# Patient Record
Sex: Female | Born: 1955
Health system: Southern US, Community
[De-identification: ages and names within clinical notes are randomized; demographics above are authoritative.]

## PROBLEM LIST (undated history)

## (undated) DIAGNOSIS — F32A Depression, unspecified: Secondary | ICD-10-CM

## (undated) DIAGNOSIS — M810 Age-related osteoporosis without current pathological fracture: Secondary | ICD-10-CM

## (undated) DIAGNOSIS — Z780 Asymptomatic menopausal state: Secondary | ICD-10-CM

## (undated) DIAGNOSIS — I10 Essential (primary) hypertension: Secondary | ICD-10-CM

## (undated) DIAGNOSIS — N289 Disorder of kidney and ureter, unspecified: Secondary | ICD-10-CM

## (undated) DIAGNOSIS — T7840XA Allergy, unspecified, initial encounter: Secondary | ICD-10-CM

## (undated) DIAGNOSIS — B0229 Other postherpetic nervous system involvement: Secondary | ICD-10-CM

## (undated) DIAGNOSIS — C4490 Unspecified malignant neoplasm of skin, unspecified: Secondary | ICD-10-CM

## (undated) DIAGNOSIS — J189 Pneumonia, unspecified organism: Secondary | ICD-10-CM

## (undated) DIAGNOSIS — E785 Hyperlipidemia, unspecified: Secondary | ICD-10-CM

## (undated) DIAGNOSIS — R51 Headache: Secondary | ICD-10-CM

## (undated) DIAGNOSIS — F419 Anxiety disorder, unspecified: Secondary | ICD-10-CM

## (undated) DIAGNOSIS — J45909 Unspecified asthma, uncomplicated: Secondary | ICD-10-CM

## (undated) DIAGNOSIS — N200 Calculus of kidney: Secondary | ICD-10-CM

## (undated) DIAGNOSIS — G43909 Migraine, unspecified, not intractable, without status migrainosus: Secondary | ICD-10-CM

## (undated) DIAGNOSIS — M8580 Other specified disorders of bone density and structure, unspecified site: Secondary | ICD-10-CM

## (undated) DIAGNOSIS — F329 Major depressive disorder, single episode, unspecified: Secondary | ICD-10-CM

## (undated) DIAGNOSIS — R519 Headache, unspecified: Secondary | ICD-10-CM

## (undated) DIAGNOSIS — B029 Zoster without complications: Secondary | ICD-10-CM

## (undated) HISTORY — DX: Pneumonia, unspecified organism: J18.9

## (undated) HISTORY — DX: Essential (primary) hypertension: I10

## (undated) HISTORY — DX: Asymptomatic menopausal state: Z78.0

## (undated) HISTORY — PX: EXPLORATORY LAPAROTOMY: SUR591

## (undated) HISTORY — DX: Calculus of kidney: N20.0

## (undated) HISTORY — DX: Allergy, unspecified, initial encounter: T78.40XA

## (undated) HISTORY — PX: COLONOSCOPY: SHX174

## (undated) HISTORY — DX: Zoster without complications: B02.9

## (undated) HISTORY — DX: Age-related osteoporosis without current pathological fracture: M81.0

## (undated) HISTORY — DX: Migraine, unspecified, not intractable, without status migrainosus: G43.909

## (undated) HISTORY — DX: Hyperlipidemia, unspecified: E78.5

## (undated) HISTORY — DX: Headache, unspecified: R51.9

## (undated) HISTORY — DX: Other specified disorders of bone density and structure, unspecified site: M85.80

## (undated) HISTORY — DX: Headache: R51

## (undated) HISTORY — DX: Other postherpetic nervous system involvement: B02.29

## (undated) HISTORY — DX: Anxiety disorder, unspecified: F41.9

## (undated) HISTORY — PX: DIAGNOSTIC LAPAROSCOPY: SUR761

## (undated) HISTORY — DX: Depression, unspecified: F32.A

## (undated) HISTORY — DX: Unspecified malignant neoplasm of skin, unspecified: C44.90

## (undated) HISTORY — PX: EYE SURGERY: SHX253

## (undated) HISTORY — DX: Major depressive disorder, single episode, unspecified: F32.9

---

## 2004-05-19 ENCOUNTER — Inpatient Hospital Stay: Payer: Self-pay | Admitting: Psychiatry

## 2004-05-19 ENCOUNTER — Other Ambulatory Visit: Payer: Self-pay

## 2004-05-20 ENCOUNTER — Encounter: Payer: Self-pay | Admitting: Family Medicine

## 2005-07-16 ENCOUNTER — Ambulatory Visit: Payer: Self-pay | Admitting: Unknown Physician Specialty

## 2005-10-21 ENCOUNTER — Ambulatory Visit: Payer: Self-pay | Admitting: Unknown Physician Specialty

## 2006-08-31 ENCOUNTER — Ambulatory Visit: Payer: Self-pay | Admitting: Unknown Physician Specialty

## 2007-10-10 ENCOUNTER — Ambulatory Visit: Payer: Self-pay | Admitting: Unknown Physician Specialty

## 2008-04-15 LAB — CONVERTED CEMR LAB
Glucose, Bld: 95 mg/dL
LDL Cholesterol: 135 mg/dL

## 2008-10-23 ENCOUNTER — Ambulatory Visit: Payer: Self-pay | Admitting: Family Medicine

## 2008-10-23 DIAGNOSIS — J309 Allergic rhinitis, unspecified: Secondary | ICD-10-CM | POA: Insufficient documentation

## 2008-10-23 DIAGNOSIS — A630 Anogenital (venereal) warts: Secondary | ICD-10-CM | POA: Insufficient documentation

## 2008-10-23 DIAGNOSIS — F339 Major depressive disorder, recurrent, unspecified: Secondary | ICD-10-CM

## 2008-10-23 DIAGNOSIS — G43009 Migraine without aura, not intractable, without status migrainosus: Secondary | ICD-10-CM | POA: Insufficient documentation

## 2008-10-23 DIAGNOSIS — Z87442 Personal history of urinary calculi: Secondary | ICD-10-CM

## 2008-10-23 DIAGNOSIS — J45909 Unspecified asthma, uncomplicated: Secondary | ICD-10-CM | POA: Insufficient documentation

## 2008-10-23 DIAGNOSIS — I10 Essential (primary) hypertension: Secondary | ICD-10-CM

## 2008-10-23 LAB — HM SIGMOIDOSCOPY

## 2008-10-23 LAB — CONVERTED CEMR LAB: LDL Cholesterol: 109 mg/dL

## 2008-10-26 ENCOUNTER — Emergency Department: Payer: Self-pay | Admitting: Emergency Medicine

## 2008-10-26 ENCOUNTER — Encounter: Payer: Self-pay | Admitting: Family Medicine

## 2008-10-28 ENCOUNTER — Telehealth: Payer: Self-pay | Admitting: Family Medicine

## 2008-10-29 ENCOUNTER — Ambulatory Visit: Payer: Self-pay | Admitting: Family Medicine

## 2008-10-29 DIAGNOSIS — R9409 Abnormal results of other function studies of central nervous system: Secondary | ICD-10-CM

## 2008-11-04 ENCOUNTER — Encounter: Payer: Self-pay | Admitting: Family Medicine

## 2008-11-13 ENCOUNTER — Encounter: Payer: Self-pay | Admitting: Family Medicine

## 2008-11-14 ENCOUNTER — Encounter (INDEPENDENT_AMBULATORY_CARE_PROVIDER_SITE_OTHER): Payer: Self-pay | Admitting: *Deleted

## 2008-11-14 ENCOUNTER — Telehealth: Payer: Self-pay | Admitting: Family Medicine

## 2008-11-19 ENCOUNTER — Ambulatory Visit: Payer: Self-pay | Admitting: Family Medicine

## 2008-11-26 ENCOUNTER — Encounter (INDEPENDENT_AMBULATORY_CARE_PROVIDER_SITE_OTHER): Payer: Self-pay | Admitting: *Deleted

## 2008-11-26 ENCOUNTER — Ambulatory Visit: Payer: Self-pay | Admitting: Family Medicine

## 2008-11-26 DIAGNOSIS — R011 Cardiac murmur, unspecified: Secondary | ICD-10-CM

## 2008-12-05 ENCOUNTER — Ambulatory Visit: Payer: Self-pay | Admitting: Family Medicine

## 2008-12-05 ENCOUNTER — Encounter: Payer: Self-pay | Admitting: Family Medicine

## 2008-12-11 ENCOUNTER — Encounter (INDEPENDENT_AMBULATORY_CARE_PROVIDER_SITE_OTHER): Payer: Self-pay | Admitting: *Deleted

## 2008-12-11 LAB — HM MAMMOGRAPHY: HM Mammogram: NORMAL

## 2008-12-19 ENCOUNTER — Ambulatory Visit: Payer: Self-pay | Admitting: Family Medicine

## 2008-12-19 DIAGNOSIS — M25569 Pain in unspecified knee: Secondary | ICD-10-CM | POA: Insufficient documentation

## 2009-01-01 ENCOUNTER — Telehealth: Payer: Self-pay | Admitting: Family Medicine

## 2009-02-05 ENCOUNTER — Encounter: Payer: Self-pay | Admitting: Family Medicine

## 2009-02-06 ENCOUNTER — Ambulatory Visit: Payer: Self-pay | Admitting: Family Medicine

## 2009-02-07 LAB — CONVERTED CEMR LAB
ALT: 21 units/L (ref 0–35)
Albumin: 4.3 g/dL (ref 3.5–5.2)
Basophils Relative: 0.7 % (ref 0.0–3.0)
Bilirubin, Direct: 0 mg/dL (ref 0.0–0.3)
CO2: 33 meq/L — ABNORMAL HIGH (ref 19–32)
Chloride: 103 meq/L (ref 96–112)
Eosinophils Absolute: 0 10*3/uL (ref 0.0–0.7)
Eosinophils Relative: 0.8 % (ref 0.0–5.0)
HCT: 40.7 % (ref 36.0–46.0)
Hemoglobin: 13.7 g/dL (ref 12.0–15.0)
Lymphs Abs: 1.2 10*3/uL (ref 0.7–4.0)
MCHC: 33.6 g/dL (ref 30.0–36.0)
MCV: 93.3 fL (ref 78.0–100.0)
Monocytes Absolute: 0.3 10*3/uL (ref 0.1–1.0)
Neutro Abs: 2.5 10*3/uL (ref 1.4–7.7)
Neutrophils Relative %: 59.8 % (ref 43.0–77.0)
Potassium: 3.9 meq/L (ref 3.5–5.1)
RBC: 4.36 M/uL (ref 3.87–5.11)
Total Protein: 7.2 g/dL (ref 6.0–8.3)
Vitamin B-12: 1045 pg/mL — ABNORMAL HIGH (ref 211–911)
WBC: 4 10*3/uL — ABNORMAL LOW (ref 4.5–10.5)

## 2009-02-25 ENCOUNTER — Ambulatory Visit: Payer: Self-pay | Admitting: Professional

## 2009-02-26 HISTORY — PX: CARDIOVASCULAR STRESS TEST: SHX262

## 2009-02-27 ENCOUNTER — Ambulatory Visit: Payer: Self-pay | Admitting: Family Medicine

## 2009-03-04 ENCOUNTER — Ambulatory Visit: Payer: Self-pay | Admitting: Family Medicine

## 2009-03-05 ENCOUNTER — Ambulatory Visit: Payer: Self-pay | Admitting: Internal Medicine

## 2009-03-06 ENCOUNTER — Encounter: Payer: Self-pay | Admitting: Internal Medicine

## 2009-03-11 ENCOUNTER — Ambulatory Visit: Payer: Self-pay

## 2009-03-11 ENCOUNTER — Ambulatory Visit: Payer: Self-pay | Admitting: Internal Medicine

## 2009-03-19 ENCOUNTER — Telehealth: Payer: Self-pay | Admitting: Family Medicine

## 2009-04-02 ENCOUNTER — Ambulatory Visit: Payer: Self-pay | Admitting: Family Medicine

## 2009-05-07 ENCOUNTER — Telehealth: Payer: Self-pay | Admitting: Family Medicine

## 2009-05-12 ENCOUNTER — Telehealth: Payer: Self-pay | Admitting: Family Medicine

## 2009-05-26 ENCOUNTER — Telehealth: Payer: Self-pay | Admitting: Family Medicine

## 2009-05-28 ENCOUNTER — Ambulatory Visit: Payer: Self-pay | Admitting: Family Medicine

## 2009-06-02 ENCOUNTER — Telehealth: Payer: Self-pay | Admitting: Family Medicine

## 2009-06-11 ENCOUNTER — Telehealth: Payer: Self-pay | Admitting: Family Medicine

## 2009-06-30 ENCOUNTER — Telehealth: Payer: Self-pay | Admitting: Family Medicine

## 2009-07-04 ENCOUNTER — Ambulatory Visit: Payer: Self-pay | Admitting: Family Medicine

## 2009-07-04 ENCOUNTER — Telehealth: Payer: Self-pay | Admitting: Family Medicine

## 2009-07-08 ENCOUNTER — Encounter: Payer: Self-pay | Admitting: Family Medicine

## 2009-07-22 ENCOUNTER — Ambulatory Visit: Payer: Self-pay | Admitting: Family Medicine

## 2009-08-04 ENCOUNTER — Telehealth: Payer: Self-pay | Admitting: Family Medicine

## 2009-08-12 ENCOUNTER — Ambulatory Visit: Payer: Self-pay | Admitting: Family Medicine

## 2009-09-26 LAB — CONVERTED CEMR LAB: Pap Smear: NORMAL

## 2009-09-30 ENCOUNTER — Telehealth: Payer: Self-pay | Admitting: Family Medicine

## 2009-10-27 ENCOUNTER — Telehealth: Payer: Self-pay | Admitting: Internal Medicine

## 2009-10-29 ENCOUNTER — Ambulatory Visit: Payer: Self-pay | Admitting: Family Medicine

## 2009-11-04 ENCOUNTER — Ambulatory Visit: Payer: Self-pay | Admitting: Psychology

## 2009-11-05 ENCOUNTER — Encounter: Payer: Self-pay | Admitting: Family Medicine

## 2009-11-12 ENCOUNTER — Ambulatory Visit: Payer: Self-pay | Admitting: Psychology

## 2009-11-17 ENCOUNTER — Inpatient Hospital Stay: Payer: Self-pay | Admitting: Unknown Physician Specialty

## 2009-11-17 ENCOUNTER — Telehealth: Payer: Self-pay | Admitting: Family Medicine

## 2009-11-19 ENCOUNTER — Encounter: Payer: Self-pay | Admitting: Family Medicine

## 2009-11-25 ENCOUNTER — Ambulatory Visit: Payer: Self-pay | Admitting: Cardiovascular Disease

## 2009-12-11 ENCOUNTER — Encounter: Admission: RE | Admit: 2009-12-11 | Discharge: 2009-12-11 | Payer: Self-pay | Admitting: Family Medicine

## 2009-12-11 ENCOUNTER — Ambulatory Visit: Payer: Self-pay | Admitting: Family Medicine

## 2009-12-11 DIAGNOSIS — K5900 Constipation, unspecified: Secondary | ICD-10-CM | POA: Insufficient documentation

## 2009-12-11 DIAGNOSIS — R142 Eructation: Secondary | ICD-10-CM

## 2009-12-11 DIAGNOSIS — R141 Gas pain: Secondary | ICD-10-CM

## 2009-12-11 DIAGNOSIS — R143 Flatulence: Secondary | ICD-10-CM

## 2009-12-15 ENCOUNTER — Telehealth: Payer: Self-pay | Admitting: Family Medicine

## 2009-12-15 ENCOUNTER — Ambulatory Visit: Payer: Self-pay | Admitting: Cardiovascular Disease

## 2009-12-15 DIAGNOSIS — Q6239 Other obstructive defects of renal pelvis and ureter: Secondary | ICD-10-CM

## 2009-12-30 ENCOUNTER — Telehealth: Payer: Self-pay | Admitting: Family Medicine

## 2009-12-30 ENCOUNTER — Ambulatory Visit: Payer: Self-pay | Admitting: Psychology

## 2010-01-07 ENCOUNTER — Ambulatory Visit: Payer: Self-pay | Admitting: Psychology

## 2010-01-13 ENCOUNTER — Telehealth: Payer: Self-pay | Admitting: Family Medicine

## 2010-01-14 ENCOUNTER — Ambulatory Visit: Payer: Self-pay | Admitting: Family Medicine

## 2010-01-14 ENCOUNTER — Ambulatory Visit: Payer: Self-pay | Admitting: Psychology

## 2010-01-15 ENCOUNTER — Telehealth: Payer: Self-pay | Admitting: Family Medicine

## 2010-01-16 ENCOUNTER — Ambulatory Visit: Payer: Self-pay | Admitting: Gastroenterology

## 2010-01-16 ENCOUNTER — Encounter (INDEPENDENT_AMBULATORY_CARE_PROVIDER_SITE_OTHER): Payer: Self-pay | Admitting: *Deleted

## 2010-01-16 LAB — CONVERTED CEMR LAB
ALT: 40 units/L — ABNORMAL HIGH (ref 0–35)
AST: 30 units/L (ref 0–37)
Basophils Absolute: 0 10*3/uL (ref 0.0–0.1)
CO2: 33 meq/L — ABNORMAL HIGH (ref 19–32)
Calcium: 9.6 mg/dL (ref 8.4–10.5)
Chloride: 104 meq/L (ref 96–112)
Eosinophils Absolute: 0.1 10*3/uL (ref 0.0–0.7)
Ferritin: 47.2 ng/mL (ref 10.0–291.0)
GFR calc non Af Amer: 78.01 mL/min (ref >60)
IgA: 192 mg/dL (ref 68–378)
Iron: 106 ug/dL (ref 42–145)
Lymphocytes Relative: 23.5 % (ref 12.0–46.0)
Monocytes Relative: 10.4 % (ref 3.0–12.0)
Platelets: 224 10*3/uL (ref 150.0–400.0)
RDW: 12.7 % (ref 11.5–14.6)
Saturation Ratios: 26.8 % (ref 20.0–50.0)
Sodium: 143 meq/L (ref 135–145)
Total Bilirubin: 0.4 mg/dL (ref 0.3–1.2)
Total Protein: 7.1 g/dL (ref 6.0–8.3)
Transferrin: 283 mg/dL (ref 212.0–360.0)

## 2010-01-19 ENCOUNTER — Telehealth: Payer: Self-pay | Admitting: Gastroenterology

## 2010-01-21 ENCOUNTER — Ambulatory Visit: Payer: Self-pay | Admitting: Gastroenterology

## 2010-01-21 LAB — HM COLONOSCOPY

## 2010-01-26 ENCOUNTER — Ambulatory Visit: Payer: Self-pay | Admitting: Unknown Physician Specialty

## 2010-01-27 ENCOUNTER — Telehealth: Payer: Self-pay | Admitting: Family Medicine

## 2010-01-28 ENCOUNTER — Telehealth: Payer: Self-pay | Admitting: Gastroenterology

## 2010-02-03 ENCOUNTER — Ambulatory Visit: Payer: Self-pay | Admitting: Gastroenterology

## 2010-02-09 ENCOUNTER — Telehealth: Payer: Self-pay | Admitting: Gastroenterology

## 2010-04-26 LAB — CONVERTED CEMR LAB
Basophils Absolute: 0 10*3/uL (ref 0.0–0.1)
Eosinophils Absolute: 0 10*3/uL (ref 0.0–0.7)
Hemoglobin: 13.7 g/dL (ref 12.0–15.0)
Lymphocytes Relative: 20.3 % (ref 12.0–46.0)
Lymphs Abs: 1.1 10*3/uL (ref 0.7–4.0)
MCHC: 33.3 g/dL (ref 30.0–36.0)
Neutro Abs: 3.7 10*3/uL (ref 1.4–7.7)
Platelets: 235 10*3/uL (ref 150.0–400.0)
RDW: 11.5 % (ref 11.5–14.6)

## 2010-04-29 NOTE — Progress Notes (Signed)
Summary: cheaper prep  Phone Note Call from Patient Call back at Home Phone [phone]   Caller: Patient Call For: Dr. Jarold Motto Reason for Call: Talk to Nurse Summary of Call: would like a cheaper prep Initial call taken by: Vallarie Mare,  January 19, 2010 9:35 AM  Follow-up for Phone Call        i left a rebate for movi prep up front for the patient to pick up when she comes for her prep. Follow-up by: Harlow Mares CMA Duncan Dull),  January 19, 2010 9:59 AM

## 2010-04-29 NOTE — Procedures (Signed)
Summary: Colonoscopy  Patient: Theresa Martin Note: All result statuses are Final unless otherwise noted.  Tests: (1) Colonoscopy (COL)   COL Colonoscopy           DONE     [facility]     [address]     [place], [place]  [address]           COLONOSCOPY PROCEDURE REPORT           PATIENT:  Theresa, Martin  MR#:  [id number]     BIRTHDATE:  1955/12/06, 54 yrs. old  GENDER:  female     ENDOSCOPIST:  Vania Rea. Jarold Motto, MD, [facility]     REF. BY:  Excell Seltzer, M.D.     PROCEDURE DATE:  01/21/2010     PROCEDURE:  Average-risk screening colonoscopy     G0121     ASA CLASS:  Class II     INDICATIONS:  change in bowel habits, constipation, Routine Risk     Screening     MEDICATIONS:   Fentanyl 75 mcg IV, Versed 7 mg IV           DESCRIPTION OF PROCEDURE:   After the risks benefits and     alternatives of the procedure were thoroughly explained, informed     consent was obtained.  Digital rectal exam was performed and     revealed no abnormalities.   The LB 180AL [id number] endoscope was     introduced through the anus and advanced to the cecum, which was     identified by both the appendix and ileocecal valve, limited by     poor preparation.    The quality of the prep was poor, using     MoviPrep.  The instrument was then slowly withdrawn as the colon     was fully examined.     <<PROCEDUREIMAGES>>           FINDINGS:  No polyps or cancers were seen.  This was otherwise a     normal examination of the colon.   Retroflexed views in the rectum     revealed no abnormalities.    The scope was then withdrawn from     the patient and the procedure completed.           COMPLICATIONS:  None     ENDOSCOPIC IMPRESSION:     1) No polyps or cancers     2) Otherwise normal examination     CHRONIC FUNCTIONAL CONSTIPATION.     RECOMMENDATIONS:     1) Continue current colorectal screening recommendations for     "routine risk" patients with a repeat colonoscopy in 10 years.  2) Continue current medications     REPEAT EXAM:  No           ______________________________     Vania Rea. Jarold Motto, MD, [facility]           CC:  Cay Schillings, MD           n.     Rosalie Doctor:   Vania Rea. Patterson at 01/21/2010 10:32 AM           Sassano, Lurena Joiner, [id number]  Note: An exclamation mark (!) indicates a result that was not dispersed into the flowsheet. Document Creation Date: 01/21/2010 10:32 AM _______________________________________________________________________  (1) Order result status: Final Collection or observation date-time: 01/21/2010 10:26 Requested date-time:  Receipt date-time:  Reported date-time:  Referring Physician:   Ordering Physician: Sheryn Bison [phone])  Specimen Source:  Source: Launa Grill Order Number: [id number] Lab site:   Appended Document: Colonoscopy    Clinical Lists Changes  Observations: Added new observation of COLONNXTDUE: 12/2019 (01/21/2010 12:53)      Appended Document: Orders Update    Clinical Lists Changes  Observations: Added new observation of HEMOCULTDUE: Not Indicated (01/21/2010 13:01)      Hemoccult Next Due:  Not Indicated

## 2010-04-29 NOTE — Progress Notes (Signed)
Summary: DENTIST APPT.  Phone Note Call from Patient Call back at [facility] Phone [phone]   Caller: Patient Call For: Tarique Loveall Summary of Call: PATIENT NEEDS TO KNOW IF SHE IF SHE NEEDS ANY MEDICATIONS BEFORE HER DENTIST APT. Initial call taken by: West Carbo,  October 27, 2009 9:30 AM  Follow-up for Phone Call        pt was told that she has a heart murmur. Please advise if she needs abx before dentist appointment.  Follow-up by: Benedict Needy, RN,  October 27, 2009 9:50 AM  Additional Follow-up for Phone Call Additional follow up Details #1::        Has not had echo (as ordered) to evaluate murmur. However, given current recommendations will not need abx prior to dental proceudre.Dolores Patty, MD, [facility]  October 27, 2009 10:03 AM      Appended Document: DENTIST APPT.    Clinical Lists Changes  Orders: Added new Referral order of Echocardiogram (Echo) - Signed      Appended Document: DENTIST APPT. Per patient would cancelled her echo today. She talked with her primary care physician and he told her she did not need echo.

## 2010-04-29 NOTE — Progress Notes (Signed)
Summary: wants referral for GI  Phone Note Call from Patient Call back at Home Phone [phone]   Caller: Patient Call For: Kerby Nora MD Summary of Call: Patient says that she is still very constipated and due to the blockage her Urologist can't see the kidney stone. She is asking if you would do a referral for GI. She prefers to go to [place].  Initial call taken by: Melody Comas,  January 13, 2010 11:43 AM  Follow-up for Phone Call        Referral sent. Follow-up by: Kerby Nora MD,  January 13, 2010 1:59 PM  Additional Follow-up for Phone Call Additional follow up Details #1::        Patient advised via message on machine that referral had been sent.Consuello Masse CMA   Additional Follow-up by: Benny Lennert CMA Duncan Dull),  January 13, 2010 2:03 PM

## 2010-04-29 NOTE — Medication Information (Signed)
Summary: Approval for Lexapro/[employer]  Approval for Lexapro/[employer]   Imported By: Lanelle Bal 07/11/2009 14:14:01  _____________________________________________________________________  External Attachment:    Type:   Image     Comment:   External Document

## 2010-04-29 NOTE — Progress Notes (Signed)
  Phone Note Outgoing Call   Call placed by: twalsh Call placed to: Patient Details for Reason: ifob cancelled Summary of Call: Ifob never returned, per [facility]. Talked to the patient, she will not be doing it. She said her GYN did it during her exam in July. Test cancelled. Initial call taken by: Mills Koller,  January 15, 2010 3:16 PM

## 2010-04-29 NOTE — Progress Notes (Signed)
Summary: New medication not working  Phone Note Call from Patient Call back at [employer] [phone]   Caller: Patient Call For: Kerby Nora MD Summary of Call: Patient says that she is not doing well on Venlafaxine 37.5mg  XR24hour.  She is back to crying again and being nervous and anxious.  Her mood is just not good since being on this medication.  Says she was on Sertraline in the past and it worked well for her.  Advised that Dr. Ermalene Searing will be out of the office until 06/10/2009.  Please advise.   Initial call taken by: Linde Gillis CMA Duncan Dull),  June 02, 2009 9:45 AM  Follow-up for Phone Call        i would not anticipate it working until 3 weeks after starting  Typically side effects can happen initially - please discuss, but generally resolve in 1 week  I am going to forward to Dr. Ermalene Searing to see if she would recommend any additional changes Follow-up by: Hannah Beat MD,  June 02, 2009 9:51 AM  Additional Follow-up for Phone Call Additional follow up Details #1::        Patient notifed as instructed Additional Follow-up by: Linde Gillis CMA Duncan Dull),  June 02, 2009 11:00 AM    Additional Follow-up for Phone Call Additional follow up Details #2::    Have her go up to 75 mg daily now (2 tabs of 37.5)...if still severe depressive symtpoms after 1  more week on higher dose call back. Follow-up by: Kerby Nora MD,  June 04, 2009 11:36 AM  Additional Follow-up for Phone Call Additional follow up Details #3:: Details for Additional Follow-up Action Taken: Patient advised.Consuello Masse CMA  Additional Follow-up by: Benny Lennert CMA Duncan Dull),  June 04, 2009 11:45 AM

## 2010-04-29 NOTE — Assessment & Plan Note (Signed)
Summary: ROA 30 MINS CYD   Vital Signs:  Patient profile:   55 year old female Height:      60 inches Weight:      100.0 pounds BMI:     19.60 Temp:     97.9 degrees F oral Pulse rate:   72 / minute Pulse rhythm:   regular BP sitting:   110 / 70  (left arm) Cuff size:   regular  Vitals Entered By: Benny Lennert CMA Duncan Dull) (April 02, 2009 4:09 PM)  History of Present Illness: Chief complaint follow up 30 minute  Recent chest pressure and palpitations...saw Dr. Teressa Lower. Neg EKG and low risk ETT.  No further chest pain and racing since.  Depression, anxiety...now on 1 month on sertraline 50 mg daily ...no SE.  Fatigue improved, mood improved daily, motivation improved some. Still using clonezepam 1/2 tab by mouth two times a day  but feels like may not need it as much. Still waking up every few hours.   Problems Prior to Update: 1)  Chest Pain  (ICD-786.50) 2)  Fatigue  (ICD-780.79) 3)  Palpitations, Occasional  (ICD-785.1) 4)  Acute Serous Otitis Media  (ICD-381.01) 5)  Knee Pain, Bilateral  (ICD-719.46) 6)  Cardiac Murmur  (ICD-785.2) 7)  Chest Wall Pain, Anterior  (ICD-786.52) 8)  Pneumonia, Right Upper Lobe  (ICD-486) 9)  Magnetic Resonance Imaging, Brain, Abnormal  (ICD-794.09) 10)  Other Screening Mammogram  (ICD-V76.12) 11)  Headache  (ICD-784.0) 12)  Renal Calculus, Hx of  (ICD-V13.01) 13)  Allergic Rhinitis  (ICD-477.9) 14)  Hx of Venereal Wart  (ICD-078.11) 15)  Hypertension  (ICD-401.9) 16)  Common Migraine  (ICD-346.10) 17)  Depression  (ICD-311) 18)  Hx of Asthma, Intermittent, Mild  (ICD-493.90)  Current Medications (verified): 1)  Sertraline Hcl 100 Mg Tabs (Sertraline Hcl) .Marland Kitchen.. 1 Tab Po Daily 2)  Clonazepam 0.5 Mg Tabs (Clonazepam) .... Take 1/2  Tab By Mouth Daily, After One Week  If Doing May Stop Entire and Use As Needed. 3)  Triamterene-Hctz 37.5-25 Mg Tabs (Triamterene-Hctz) .... Take 1 Tablet By Mouth Once A Day 4)  Multivitamins  Caps  (Multiple Vitamin) .... One A Day 5)  Caltrate 600+d Plus 600-400 Mg-Unit Tabs (Calcium Carbonate-Vit D-Min) .... One A Day 6)  Vitamin C 1000 Mg Tabs (Ascorbic Acid) .... Once A Day 7)  Fish Oil 1000 Mg Caps (Omega-3 Fatty Acids) .... Chew 1 Daily (Fish Oil Chewables)  Allergies (verified): No Known Drug Allergies  Past History:  Past medical, surgical, family and social histories (including risk factors) reviewed, and no changes noted (except as noted below).  Past Medical History: Reviewed history from 03/05/2009 and no changes required. Depression/anxiety Hypertension Allergic rhinitis Headaches  Past Surgical History: exploratory laproscopy for infertility C-section 187, 1989 02/2009 treadmill stress test: low risk  GYN Dr. Severiano Gilbert [facility]: Dr. Imogene Burn  Family History: Reviewed history from 03/05/2009 and no changes required. father: HTN deceased (in his 73's) mother: HTN, CAD deceased at age 71 with CHF brother: arrythmia, HTN, sleep  sister: liver failure unknown cause 6 siblings total no cancer  Social History: Reviewed history from 10/23/2008 and no changes required. 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 CV:  Denies chest pain or discomfort. Resp:  Denies shortness of breath. GI:  Denies abdominal pain. GU:  Denies dysuria.  Physical Exam  General:  Well-developed,well-nourished,in no acute distress; alert,appropriate and cooperative throughout examination Mouth:  Oral mucosa and oropharynx without lesions or exudates.  Teeth in good repair. Neck:  no carotid bruit or thyromegaly no cervical or supraclavicular lymphadenopathy  Chest Wall:  No deformities, masses, or tenderness noted. Lungs:  Normal respiratory effort, chest expands symmetrically. Lungs are clear  to auscultation, no crackles or wheezes. Heart:  Normal rate and regular rhythm. S1 and S2 normal without gallop, murmur, click, rub or other extra sounds. Abdomen:  Bowel sounds positive,abdomen soft and non-tender without masses, organomegaly or hernias noted. Pulses:  R and L posterior tibial pulses are full and equal bilaterally  Extremities:  No clubbing, cyanosis, edema, or deformity noted with normal full range of motion of all joints.   Psych:  Cognition and judgment appear intact. Alert and cooperative with normal attention span and concentration. No apparent delusions, illusions, hallucinations   Impression & Recommendations:  Problem # 1:  DEPRESSION (ICD-311) Assessment Improved More room for improvement, but doing better with mood and energy.  Will increase to 100 mg daily. Follow up in 1 month if not improving.  Continue exercise and good sleep hygeine.  Her updated medication list for this problem includes:    Sertraline Hcl 100 Mg Tabs (Sertraline hcl) .Marland Kitchen... 1 tab po daily    Clonazepam 0.5 Mg Tabs (Clonazepam) .Marland Kitchen... Take 1/2  tab by mouth daily, after one week  if doing may stop entire and use as needed.  Problem # 2:  FATIGUE (ICD-780.79) Assessment: Improved Improved with treatment of #1 with sertraline.  Continue to wean of clonazapam slowly but steadily.   Problem # 3:  CHEST PAIN (ICD-786.50) Assessment: Improved Resolved..cardiac eval neg, low risk treadmill. May have been due to anxiety depression that was not yet controlled at that time.   Complete Medication List: 1)  Sertraline Hcl 100 Mg Tabs (Sertraline hcl) .Marland Kitchen.. 1 tab po daily 2)  Clonazepam 0.5 Mg Tabs (Clonazepam) .... Take 1/2  tab by mouth daily, after one week  if doing may stop entire and use as needed. 3)  Triamterene-hctz 37.5-25 Mg Tabs (Triamterene-hctz) .... Take 1 tablet by mouth once a day 4)  Multivitamins Caps (Multiple vitamin) .... One a day 5)  Caltrate 600+d Plus 600-400 Mg-unit Tabs  (Calcium carbonate-vit d-min) .... One a day 6)  Vitamin C 1000 Mg Tabs (Ascorbic acid) .... Once a day 7)  Fish Oil 1000 Mg Caps (Omega-3 fatty acids) .... Chew 1 daily (fish oil chewables)  Patient Instructions: 1)  MAke follow up appt if mood not improving in 3-4 weeks. Prescriptions: SERTRALINE HCL 100 MG TABS (SERTRALINE HCL) 1 tab po daily  #30 x 3   Entered and Authorized by:   Kerby Nora MD   Signed by:   Kerby Nora MD on 04/02/2009   Method used:   [facility] to        [facility] [phone]* (retail)       [address] [place], [place]  [address]       Ph: [phone] or [phone]       Fax: [phone]   RxID:   [id number]   Current Allergies (reviewed today): No known allergies

## 2010-04-29 NOTE — Assessment & Plan Note (Signed)
Summary: CONGESTION,COUGH/CLE   Vital Signs:  Patient profile:   55 year old female Height:      60 inches Weight:      98.0 pounds BMI:     19.21 Temp:     98.3 degrees F oral Pulse rate:   72 / minute Pulse rhythm:   regular BP sitting:   140 / 80  (left arm) Cuff size:   regular  Vitals Entered By: Benny Lennert CMA Duncan Dull) (May 28, 2009 10:09 AM)  History of Present Illness: Chief complaint cough and congestion  Anxiety..had trouble with anxiety..nervous anxious in past few weeks Tried adding back clonazepam 1/2 tab by mouth two times a day but hasn't helped much. TAkes before bed but does not help her sleep.  Took one trazodone but made her feel strange and caused headache and cannot sleep at night.   Has been on sertraline 100 mg for several months.  Acute Visit History:      The patient complains of cough, earache, headache, nasal discharge, sinus problems, and sore throat.  These symptoms began 1 week ago.  She denies fever.  Other comments include: Chest congestion, body ache Taking OTC med for chest congestion. Minimal improvement but not worsening. .        The cough interferes with her sleep.  The character of the cough is described as nonproductive.  There is no history of wheezing or shortness of breath associated with her cough.        The earache is located on the right side.        She complains of sinus pressure and nasal congestion.        Problems Prior to Update: 1)  Knee Pain, Bilateral  (ICD-719.46) 2)  Cardiac Murmur  (ICD-785.2) 3)  Magnetic Resonance Imaging, Brain, Abnormal  (ICD-794.09) 4)  Other Screening Mammogram  (ICD-V76.12) 5)  Renal Calculus, Hx of  (ICD-V13.01) 6)  Allergic Rhinitis  (ICD-477.9) 7)  Hx of Venereal Wart  (ICD-078.11) 8)  Hypertension  (ICD-401.9) 9)  Common Migraine  (ICD-346.10) 10)  Depression  (ICD-311) 11)  Hx of Asthma, Intermittent, Mild  (ICD-493.90)  Current Medications (verified): 1)  Sertraline Hcl 100 Mg  Tabs (Sertraline Hcl) .Marland Kitchen.. 1 Tab Po Daily 2)  Clonazepam 0.5 Mg Tabs (Clonazepam) .... Take 1/2  Tab By Mouth Daily, After One Week  If Doing May Stop Entire and Use As Needed. 3)  Triamterene-Hctz 37.5-25 Mg Tabs (Triamterene-Hctz) .... Take 1 Tablet By Mouth Once A Day 4)  Multivitamins  Caps (Multiple Vitamin) .... One A Day 5)  Caltrate 600+d Plus 600-400 Mg-Unit Tabs (Calcium Carbonate-Vit D-Min) .... One A Day 6)  Vitamin C 1000 Mg Tabs (Ascorbic Acid) .... Once A Day 7)  Fish Oil 1000 Mg Caps (Omega-3 Fatty Acids) .... Chew 1 Daily (Fish Oil Chewables) 8)  Trazodone Hcl 50 Mg Tabs (Trazodone Hcl) .Marland Kitchen.. 1 Tab By Mouth At Bedtime As Needed Insomnia  Allergies (verified): No Known Drug Allergies  Past History:  Past medical, surgical, family and social histories (including risk factors) reviewed, and no changes noted (except as noted below).  Past Medical History: Reviewed history from 03/05/2009 and no changes required. Depression/anxiety Hypertension Allergic rhinitis Headaches  Past Surgical History: Reviewed history from 04/02/2009 and no changes required. exploratory laproscopy for infertility C-section 187, 1989 02/2009 treadmill stress test: low risk  GYN Dr. Severiano Gilbert [facility]: Dr. Imogene Burn  Family History: Reviewed history from 03/05/2009 and no changes required. father: HTN deceased (  in his 34's) mother: HTN, CAD deceased at age 36 with CHF brother: arrythmia, HTN, sleep  sister: liver failure unknown cause 6 siblings total no cancer  Social History: Reviewed history from 10/23/2008 and no changes required. 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 CV:  Denies chest pain or discomfort. GI:  Denies abdominal pain. GU:  Denies dysuria. Psych:  Denies suicidal  thoughts/plans.  Physical Exam  General:  Well-developed,well-nourished,in no acute distress; alert,appropriate and cooperative throughout examination Head:  no maxillary ttp B Ears:  cerumen B ears, TMs clear B Nose:  nasal discharge, no mucosal pallor.   Mouth:  MMMpharynx pink and moist, post nasal drip Neck:  no carotid bruit or thyromegaly no cervical or supraclavicular lymphadenopathy  Lungs:  Normal respiratory effort, chest expands symmetrically. Lungs are clear to auscultation, no crackles or wheezes. Heart:  Normal rate and regular rhythm. S1 and S2 normal without gallop, murmur, click, rub or other extra sounds. Psych:  Cognition and judgment appear intact. Alert and cooperative with normal attention span and concentration. No apparent delusions, illusions, hallucinations   Impression & Recommendations:  Problem # 1:  BRONCHITIS- ACUTE (ICD-466.0) Will treat with antibitoics given no improvement after 7 days.  Her updated medication list for this problem includes:    Azithromycin 250 Mg Tabs (Azithromycin) .Marland Kitchen... 2 tab by mouth x  then  tab  by mouth daily  Take antibiotics and other medications as directed. Encouraged to push clear liquids, get enough rest, and take acetaminophen as needed. To be seen in 5-7 days if no improvement, sooner if worse.  Problem # 2:  DEPRESSION (ICD-311) Depression, well controlled but continues to have anxiety and difficulty sleeping... will havee her go back to previous clonazepam dose 1/2 tab by mouth daily, since higher dose not helping much. Instead with change to venlafaxine in effort to decrease anxiety and therefore help with sleep.  Follow up in 1 month. If anxiety better on venlafaxine...will try again to stop clonazepam and may consider other sleep med such as ambien, lunesta etc.  Her updated medication list for this problem includes:    Venlafaxine Hcl 37.5 Mg Xr24h-cap (Venlafaxine hcl) .Marland Kitchen... 1 tab by mouth daily x 1 week, then  increase to 2 tabs by mouth    Clonazepam 0.5 Mg Tabs (Clonazepam) .Marland Kitchen... Take 1/2  tab by mouth daily,    Trazodone Hcl 50 Mg Tabs (Trazodone hcl) .Marland Kitchen... 1 tab by mouth at bedtime as needed insomnia  Complete Medication List: 1)  Venlafaxine Hcl 37.5 Mg Xr24h-cap (Venlafaxine hcl) .Marland Kitchen.. 1 tab by mouth daily x 1 week, then increase to 2 tabs by mouth 2)  Clonazepam 0.5 Mg Tabs (Clonazepam) .... Take 1/2  tab by mouth daily, 3)  Triamterene-hctz 37.5-25 Mg Tabs (Triamterene-hctz) .... Take 1 tablet by mouth once a day 4)  Multivitamins Caps (Multiple vitamin) .... One a day 5)  Caltrate 600+d Plus 600-400 Mg-unit Tabs (Calcium carbonate-vit d-min) .... One a day 6)  Vitamin C 1000 Mg Tabs (Ascorbic acid) .... Once a day 7)  Fish Oil 1000 Mg Caps (Omega-3 fatty acids) .... Chew 1 daily (fish oil chewables) 8)  Trazodone Hcl 50 Mg Tabs (Trazodone hcl) .Marland Kitchen.. 1 tab by mouth at bedtime as needed insomnia 9)  Azithromycin 250 Mg Tabs (Azithromycin) .... 2 tab by mouth x  then  tab  by mouth daily  Patient Instructions: 1)  Mucinex DM.Marland Kitchenguafenesin.  2)  Nasal saine irrigtaation. 3)  Start the antibiotics.  4)  Go back to lower dose of colonazepam, once daily. 5)  Stop sertraline... change to effexor at bedtime. 1 tab for 1 week then 2 tabs if tolerated 6)  Follow up in 1 month.  Prescriptions: CLONAZEPAM 0.5 MG TABS (CLONAZEPAM) Take 1/2  tab by mouth daily,  #30 x 0   Entered and Authorized by:   Kerby Nora MD   Signed by:   Kerby Nora MD on 05/28/2009   Method used:   Print then Give to Patient   RxID:   [id number] VENLAFAXINE HCL 37.5 MG XR24H-CAP (VENLAFAXINE HCL) 1 tab by mouth daily x 1 week, then increase to 2 tabs by mouth  #60 x 3   Entered and Authorized by:   Kerby Nora MD   Signed by:   Kerby Nora MD on 05/28/2009   Method used:   [facility] to        [facility] [phone]* (retail)       [address] [place], [place]  [address]       Ph:  [phone] or [phone]       Fax: [phone]   RxID:   [id number] AZITHROMYCIN 250 MG TABS (AZITHROMYCIN) 2 tab by mouth x  then  tab  by mouth daily  #6 x 0   Entered and Authorized by:   Kerby Nora MD   Signed by:   Kerby Nora MD on 05/28/2009   Method used:   [facility] to        [facility] [phone]* (retail)       [address] [place], [place]  [address]       Ph: [phone] or [phone]       Fax: [phone]   RxID:   [id number]   Current Allergies (reviewed today): No known allergies

## 2010-04-29 NOTE — Progress Notes (Signed)
Summary: refill request for clonazepam and pt wants something for sleep  Phone Note Refill Request Call back at Home Phone [phone]   Refills Requested: Medication #1:  CLONAZEPAM 0.5 MG TABS Take 1/2  tab by mouth daily   Last Refilled: 03/19/2009 [facility] [address] [address]  Also, pt is having problems sleeping and says that you had mentioned giving her something at her last office visit.  She doesnt remember what you had mentioned.   Please advise.  Initial call taken by: Lowella Petties CMA,  May 07, 2009 4:25 PM  Follow-up for Phone Call        Rx called to pharmacy Follow-up by: Linde Gillis CMA Duncan Dull),  May 08, 2009 8:27 AM    New/Updated Medications: TRAZODONE HCL 50 MG TABS (TRAZODONE HCL) 1 tab by mouth at bedtime as needed insomnia Prescriptions: CLONAZEPAM 0.5 MG TABS (CLONAZEPAM) Take 1/2  tab by mouth daily, after one week  if doing may stop entire and use as needed.  #30 x 0   Entered and Authorized by:   Kerby Nora MD   Signed by:   Kerby Nora MD on 05/07/2009   Method used:   Telephoned to ...       [facility] [phone]* (retail)       [address] [place], [place]  [address]       Ph: [phone] or [phone]       Fax: [phone]   RxID:   [id number] TRAZODONE HCL 50 MG TABS (TRAZODONE HCL) 1 tab by mouth at bedtime as needed insomnia  #30 x 0   Entered and Authorized by:   Kerby Nora MD   Signed by:   Kerby Nora MD on 05/07/2009   Method used:   Telephoned to ...       [facility] [phone]* (retail)       [address] [place], [place]  [address]       Ph: [phone] or [phone]       Fax: [phone]   RxID:   [id number]

## 2010-04-29 NOTE — Assessment & Plan Note (Signed)
Summary: 8:30  ABD PAIN,SWELLING/CLE   Vital Signs:  Patient profile:   55 year old female Height:      60 inches Weight:      112.0 pounds BMI:     21.95 Temp:     97.7 degrees F oral Pulse rate:   72 / minute Pulse rhythm:   regular BP sitting:   110 / 70  (left arm) Cuff size:   regular  Vitals Entered By: Benny Lennert CMA Duncan Dull) (December 11, 2009 8:39 AM)  History of Present Illness: Chief complaint abdominal swelling and pain but, no BM in 3 days  Recent behavoiral hospitalization x 3 weeks...for severe depression and possible Bipolar.  Now on lithium, ativan, seroquel and neurontin. Followed up with Dr. Imogene Burn yesterday... had labs done today...LFTs. Sleeping better now, but some continue panic attacks.Marland Kitchenable to calm herself down.  Neuronitn is controling headaches.   Since in hospitalx 2 weeks ..she noted abdmominal swelling.  HAs noted this some in past when starting seroquel. occ pain to palpation in left upper abdomen. No BM in past 3 days...using milk of magnesia intermittantly.  No blood in stool. No vaginal bleding. Feels full of air...no relief with BMs  Still has ovaries and uterus. LAst seen Dr. Daiva Eves in July.. nml pelvic exam, pap smear nml then.  Per pt has never had colon cancer screening.  Allergies (verified): No Known Drug Allergies  Past History:  Past medical, surgical, family and social histories (including risk factors) reviewed, and no changes noted (except as noted below).  Past Medical History: Reviewed history from 03/05/2009 and no changes required. Depression/anxiety Hypertension Allergic rhinitis Headaches  Past Surgical History: Reviewed history from 04/02/2009 and no changes required. exploratory laproscopy for infertility C-section 187, 1989 02/2009 treadmill stress test: low risk  GYN Dr. Severiano Gilbert [facility]: Dr. Imogene Burn  Family History: Reviewed history from 03/05/2009 and no changes required. father: HTN deceased (in  his 70's) mother: HTN, CAD deceased at age 8 with CHF brother: arrythmia, HTN, sleep  sister: liver failure unknown cause 6 siblings total no cancer  Social History: Reviewed history from 10/23/2008 and no changes required. 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 CV:  Denies chest pain or discomfort. Resp:  Denies shortness of breath. GI:  Complains of abdominal pain; denies bloody stools. GU:  Denies abnormal vaginal bleeding and dysuria.  Physical Exam  General:  Well-developed,well-nourished,in no acute distress; alert,appropriate and cooperative throughout examination Mouth:  MMM Neck:  no carotid bruit or thyromegaly no cervical or supraclavicular lymphadenopathy  Lungs:  Normal respiratory effort, chest expands symmetrically. Lungs are clear to auscultation, no crackles or wheezes. Heart:  Normal rate and regular rhythm. S1 and S2 normal without gallop, murmur, click, rub or other extra sounds. Abdomen:  Significant increasing in abdominal girth/tympanic for such a small lady...ttp in left lower quadrant, otherwise nontender, no rebound, no guarding. soft, mild increase in bowel sounds. Pulses:  R and L posterior tibial pulses are full and equal bilaterally  Extremities:  No clubbing, cyanosis, edema, or deformity noted with normal full range of motion of all joints.   Psych:  Oriented X3, memory intact for recent and remote, normally interactive, good eye contact, not anxious appearing, and not depressed appearing.     Impression & Recommendations:  Problem # 1:  ABDOMINAL BLOATING (ICD-787.3) Given  pain with palpation of LLQ...concern for ovarian pathology. May be due to med SE vs constipation.  Will eval with pelvic US  Hepatic panel pending from labs orered by Dr,  Imogene Burn. Orders: Radiology Referral (Radiology)  Problem # 2:  CONSTIPATION (ICD-564.00) Increase fiber, water and start daily miralax.  Call if BMs not improvng. Info given on increasing fiber in diet.  Problem # 3:  DEPRESSION (ICD-311) Now on multiple meds...followed by Dr. Imogene Burn. The following medications were removed from the medication list:    Mirtazapine 30 Mg Tbdp (Mirtazapine) .Marland Kitchen... 1 tab by mouth daily Her updated medication list for this problem includes:    Ativan 1 Mg Tabs (Lorazepam) .Marland Kitchen... Take on tablet every 4 hours    Remeron 30 Mg Tabs (Mirtazapine) ..... One time daily  Complete Medication List: 1)  Triamterene-hctz 37.5-25 Mg Tabs (Triamterene-hctz) .... Take 1 tablet by mouth once a day 2)  Ativan 1 Mg Tabs (Lorazepam) .... Take on tablet every 4 hours 3)  Lithium Carbonate 300 Mg Caps (Lithium carbonate) .... One tablet by mouth 2 times daily 4)  Neurontin 300 Mg Caps (Gabapentin) .... One tablet at bedtime 5)  Seroquel Xr 400 Mg Xr24h-tab (Quetiapine fumarate) .... One tablet daily 6)  Seroquel 200 Mg Tabs (Quetiapine fumarate) .... Take one tablet 3 times daily 7)  Remeron 30 Mg Tabs (Mirtazapine) .... One time daily  Patient Instructions: 1)   Referral Appointment Information 2)  Day/Date: 3)  Time: 4)  Place/MD: 5)  Address: 6)  Phone/Fax: 7)  Patient given appointment information. Information/Orders faxed/mailed.  8)  Start miralax for constipation daily. 9)   Increase water, increase fiber in diet. 10)  Have [facility] send [place] lab results when back.  11)   Return stool cards for colon cancer screening.   Current Allergies (reviewed today): No known allergies   Flu Vaccine Next Due:  Refused Last PAP:  pending (10/17/2008 2:24:11 PM) PAP Result Date:  09/26/2009 PAP Result:  normal PAP Next Due:  1 yr

## 2010-04-29 NOTE — Letter (Signed)
Summary: E-Mail from Dr.Jane Rudean Curt from Dr.Jane Linn   Imported By: Beau Fanny 11/20/2009 14:41:58  _____________________________________________________________________  External Attachment:    Type:   Image     Comment:   External Document

## 2010-04-29 NOTE — Assessment & Plan Note (Signed)
Summary: 2 wk f/u dlo   Vital Signs:  Patient profile:   56 year old female Height:      60 inches Weight:      105.8 pounds BMI:     20.74 Temp:     97.4 degrees F oral Pulse rate:   72 / minute Pulse rhythm:   regular BP sitting:   130 / 70  (left arm) Cuff size:   regular  Vitals Entered By: Benny Lennert CMA Duncan Dull) (Aug 12, 2009 4:36 PM)  History of Present Illness: Chief complaint 2 wk follow up  Insomnia: on sereoquel low dose. Much improved on this medicaiton. Falling asleep.   Depression, anxiety: Max dose lezapro did not help at all.Nowon mirtazapine only for 1 week. (Started back on 45 mg daily instead of 30 mg as I tolerated.)  Weight gain...,abdomen very tight.  Since adding mirtazapine..significant  dizzyness with both together.  Problems Prior to Update: 1)  Knee Pain, Bilateral  (ICD-719.46) 2)  Cardiac Murmur  (ICD-785.2) 3)  Magnetic Resonance Imaging, Brain, Abnormal  (ICD-794.09) 4)  Other Screening Mammogram  (ICD-V76.12) 5)  Renal Calculus, Hx of  (ICD-V13.01) 6)  Allergic Rhinitis  (ICD-477.9) 7)  Hx of Venereal Wart  (ICD-078.11) 8)  Hypertension  (ICD-401.9) 9)  Common Migraine  (ICD-346.10) 10)  Depression  (ICD-311) 11)  Hx of Asthma, Intermittent, Mild  (ICD-493.90)  Current Medications (verified): 1)  Triamterene-Hctz 37.5-25 Mg Tabs (Triamterene-Hctz) .... Take 1 Tablet By Mouth Once A Day 2)  Vitamin C 1000 Mg Tabs (Ascorbic Acid) .... Once A Day 3)  Mirtazapine 30 Mg Tabs (Mirtazapine) .Marland Kitchen.. 1 Tab By Mouth Daily 4)  Seroquel 25 Mg Tabs (Quetiapine Fumarate) .Marland Kitchen.. 1 Tab By Mouth At Bedtime, After 1 Week Increase To 50 Mg At Bedtime.  Allergies (verified): No Known Drug Allergies  Past History:  Past medical, surgical, family and social histories (including risk factors) reviewed, and no changes noted (except as noted below).  Past Medical History: Reviewed history from 03/05/2009 and no changes  required. Depression/anxiety Hypertension Allergic rhinitis Headaches  Past Surgical History: Reviewed history from 04/02/2009 and no changes required. exploratory laproscopy for infertility C-section 187, 1989 02/2009 treadmill stress test: low risk  GYN Dr. Severiano Gilbert [facility]: Dr. Imogene Burn  Family History: Reviewed history from 03/05/2009 and no changes required. father: HTN deceased (in his 53's) mother: HTN, CAD deceased at age 77 with CHF brother: arrythmia, HTN, sleep  sister: liver failure unknown cause 6 siblings total no cancer  Social History: Reviewed history from 10/23/2008 and no changes required. 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 CV:  Denies chest pain or discomfort. Resp:  Denies shortness of breath.  Physical Exam  General:  Well-developed,well-nourished,in no acute distress; alert,appropriate and cooperative throughout examination Mouth:  MMM Lungs:  Normal respiratory effort, chest expands symmetrically. Lungs are clear to auscultation, no crackles or wheezes. Heart:  Normal rate and regular rhythm. S1 and S2 normal without gallop, murmur, click, rub or other extra sounds. Psych:  Oriented X3, memory intact for recent and remote, normally interactive, good eye contact, and slightly anxious.     Impression & Recommendations:  Problem # 1:  DEPRESSION (ICD-311) Denies SI.  Improvement in insomnia with seroquel.. ? dizzyness, sedation with addition of mirtazapine...but started back at max dose she was on previously as opposed to  gradaully increasing. Will cahgne to mirtazapine 15 mg daily..increase as tolerated. Follow up in 3 -4 weeks, but call earlier if SE not improveing on lower dose for longer period of time.  Her updated medication list for this problem  includes:    Mirtazapine 15 Mg Tabs (Mirtazapine) .Marland Kitchen... 1 tab by mouth daily  Complete Medication List: 1)  Triamterene-hctz 37.5-25 Mg Tabs (Triamterene-hctz) .... Take 1 tablet by mouth once a day 2)  Vitamin C 1000 Mg Tabs (Ascorbic acid) .... Once a day 3)  Mirtazapine 15 Mg Tabs (Mirtazapine) .Marland Kitchen.. 1 tab by mouth daily 4)  Seroquel 25 Mg Tabs (Quetiapine fumarate) .Marland Kitchen.. 1 tab by mouth at bedtime, after 1 week increase to 50 mg at bedtime.  Patient Instructions: 1)  Decrease down to mirtazapine 15 mg daily. 2)  Call if dizzyness not improved on lower dose of mirtazapine.  3)  Continue seroquel 25 mg at bedtime. 4)  Follow up 3-4 weeks. Prescriptions: MIRTAZAPINE 15 MG TABS (MIRTAZAPINE) 1 tab by mouth daily  #30 x 11   Entered and Authorized by:   Kerby Nora MD   Signed by:   Kerby Nora MD on 08/12/2009   Method used:   [facility] to        [facility] [phone]* (retail)       [address] [place], [place]  [address]       Ph: [phone] or [phone]       Fax: [phone]   RxID:   [id number]   Current Allergies (reviewed today): No known allergies

## 2010-04-29 NOTE — Assessment & Plan Note (Signed)
Summary: follow up/ alc   Vital Signs:  Patient profile:   55 year old female Height:      60 inches Weight:      97.2 pounds BMI:     19.05 Temp:     98.1 degrees F oral Pulse rate:   72 / minute Pulse rhythm:   regular BP sitting:   130 / 90  (left arm) Cuff size:   regular  Vitals Entered By: Benny Lennert CMA Duncan Dull) (July 04, 2009 9:00 AM)  History of Present Illness: Chief complaint follow up  Anxiety, poor control on sertraline 100 mg daily.  Tried  amitryptiline for sleep for a week but gave er SE..couldn't function. Venlafaxine was ineffective. Very shaky , irritable, chest tightness with stress at work. Tearful. Decreased energy. No motivation. This is the worst she has felt in a long time. Work is very stressful in last few months. Not seeing a counselor since initial visit...too expensive.  Some weight loss..weighs 97 lbs.    No SI. No HI.   Has been able to wean of clonazepam entirely in last month.!    Problems Prior to Update: 1)  Knee Pain, Bilateral  (ICD-719.46) 2)  Cardiac Murmur  (ICD-785.2) 3)  Magnetic Resonance Imaging, Brain, Abnormal  (ICD-794.09) 4)  Other Screening Mammogram  (ICD-V76.12) 5)  Renal Calculus, Hx of  (ICD-V13.01) 6)  Allergic Rhinitis  (ICD-477.9) 7)  Hx of Venereal Wart  (ICD-078.11) 8)  Hypertension  (ICD-401.9) 9)  Common Migraine  (ICD-346.10) 10)  Depression  (ICD-311) 11)  Hx of Asthma, Intermittent, Mild  (ICD-493.90)  Current Medications (verified): 1)  Triamterene-Hctz 37.5-25 Mg Tabs (Triamterene-Hctz) .... Take 1 Tablet By Mouth Once A Day 2)  Vitamin C 1000 Mg Tabs (Ascorbic Acid) .... Once A Day 3)  Lexapro 10 Mg Tabs (Escitalopram Oxalate) .Marland Kitchen.. 1 Tab By Mouth Daily  Allergies (verified): No Known Drug Allergies  Past History:  Past medical, surgical, family and social histories (including risk factors) reviewed, and no changes noted (except as noted below).  Past Medical History: Reviewed history  from 03/05/2009 and no changes required. Depression/anxiety Hypertension Allergic rhinitis Headaches  Past Surgical History: Reviewed history from 04/02/2009 and no changes required. exploratory laproscopy for infertility C-section 187, 1989 02/2009 treadmill stress test: low risk  GYN Dr. Severiano Gilbert [facility]: Dr. Imogene Burn  Family History: Reviewed history from 03/05/2009 and no changes required. father: HTN deceased (in his 52's) mother: HTN, CAD deceased at age 17 with CHF brother: arrythmia, HTN, sleep  sister: liver failure unknown cause 6 siblings total no cancer  Social History: Reviewed history from 10/23/2008 and no changes required. 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  Circels under eyes. General:  Complains of fatigue. CV:  Denies chest pain or discomfort. Resp:  Denies shortness of breath. GI:  Denies abdominal pain.  Physical Exam  General:  thin appearing female in NAD Mouth:  MMM Neck:  no carotid bruit or thyromegaly no cervical or supraclavicular lymphadenopathy  Lungs:  Normal respiratory effort, chest expands symmetrically. Lungs are clear to auscultation, no crackles or wheezes. Heart:  Normal rate and regular rhythm. S1 and S2 normal without gallop, murmur, click, rub or other extra sounds. Psych:  Oriented X3, memory intact for recent and remote, normally interactive, good eye contact, and  slightly anxious.     Impression & Recommendations:  Problem # 1:  DEPRESSION (ICD-311) and anxiety. HAs been successful weaning of clonazepam..but symptoms are poorly controlled. She would like to avoid addictive medicaitons and multiple pshyc drugs if possible. Will change sertraline to lexapro if financially possible. Close follow up in 2 weeks.    Encouraged healthy eating.  The following medications were removed from the medication list:    Clonazepam 0.5 Mg Tabs (Clonazepam) .Marland Kitchen... Take 1/2  tab by mouth daily,    Amitriptyline Hcl 25 Mg Tabs (Amitriptyline hcl) .Marland Kitchen... 1 tab by mouth at bedtime as needed insomnia , if not effective..can increase to 2 tab by mouth at bedtime Her updated medication list for this problem includes:    Lexapro 10 Mg Tabs (Escitalopram oxalate) .Marland Kitchen... 1 tab by mouth daily  Complete Medication List: 1)  Triamterene-hctz 37.5-25 Mg Tabs (Triamterene-hctz) .... Take 1 tablet by mouth once a day 2)  Vitamin C 1000 Mg Tabs (Ascorbic acid) .... Once a day 3)  Lexapro 10 Mg Tabs (Escitalopram oxalate) .Marland Kitchen.. 1 tab by mouth daily  Patient Instructions: 1)  Start lexapro daily. 2)  Please schedule a follow-up appointment in 2 weeks mood.  Prescriptions: LEXAPRO 10 MG TABS (ESCITALOPRAM OXALATE) 1 tab by mouth daily  #30 x 3   Entered and Authorized by:   Kerby Nora MD   Signed by:   Kerby Nora MD on 07/04/2009   Method used:   [facility]ronically to        [facility] [phone]* (retail)       [address] [place], [place]  [address]       Ph: [phone] or [phone]       Fax: [phone]   RxID:   [id number]   Current Allergies (reviewed today): No known allergies

## 2010-04-29 NOTE — Progress Notes (Signed)
Summary: very constipated  Phone Note Call from Patient Call back at Home Phone [phone]   Caller: Patient Summary of Call: Pt saw urologist for the second time today.  They told her she is very,very constipated.  The CT dye she was given has hardened in her colon, this showed up on x-ray and urologist could not see kidney stone to to blockage.  Urologist has suggested she use something strong to clean her out or she will end up in ER. She has been using miralax and enemas.  He has referred her to GI, but she has not yet called them for appt.  [facility] [address] Initial call taken by: Lowella Petties CMA,  December 30, 2009 11:31 AM  Follow-up for Phone Call        Sent in lactulose..strong med. Call if no BM in 24-48 hours.  Follow-up by: Kerby Nora MD,  December 30, 2009 1:44 PM  Additional Follow-up for Phone Call Additional follow up Details #1::        Patient advised and will call [facility]ack if no bowel movement.Consuello Masse CMA   Additional Follow-up by: Benny Lennert CMA Duncan Dull),  December 30, 2009 1:47 PM    New/Updated Medications: LACTULOSE 10 GM/15ML SOLN (LACTULOSE) 30 ml two times a day as needed constipation Prescriptions: LACTULOSE 10 GM/15ML SOLN (LACTULOSE) 30 ml two times a day as needed constipation  #1 bottle x 1   Entered and Authorized by:   Kerby Nora MD   Signed by:   Kerby Nora MD on 12/30/2009   Method used:   Electronically to        [facility] [phone]* (retail)       [address] [place], [place]  [address]       Ph: [phone] or [phone]       Fax: [phone]   RxID:   [id number]

## 2010-04-29 NOTE — Progress Notes (Signed)
  Phone Note From Other Clinic   Caller: Nurse Call For: Theresa Martin Summary of Call: ct scan results she has moderate right hydronephrosis likly due to congenital upj obstruction there is also a 5 mm right renal calculus possible mild left upj obstuction. Large fecal burden through out the colon.  Follow-up for Phone Call        reviewed imaging - hydro felt to be 2/2 congenital UPJ obstruction.  Also large fecal burden consistent with constipation as PCP's suspicion.  Rec close f/u with PCP, please route to PCP as well. Follow-up by: Eustaquio Boyden  MD,  December 15, 2009 4:51 PM  Additional Follow-up for Phone Call Additional follow up Details #1::        Called pt to explain findings. Will refer to URO for eval/? treatment needed for congenital UPJ obstruction   Has been having daily BMs...will try miralax to empty bowels. Pshcy is changin meds to help with bloating and constipaton. MAy need to stop seroquel.  Additional Follow-up by: Kerby Nora MD,  December 16, 2009 2:11 PM  New Problems: URETEROPELVIC JUNCTION OBSTRUCTION, CONGENITAL (ICD-753.29)   Additional Follow-up for Phone Call Additional follow up Details #2::    Appt made with Dr Evelene Croon for 12/18/2009 at 3:00pm. Follow-up by: Carlton Adam,  December 18, 2009 9:33 AM  New Problems: URETEROPELVIC JUNCTION OBSTRUCTION, CONGENITAL (ICD-753.29)

## 2010-04-29 NOTE — Progress Notes (Signed)
Summary: pt is constipated  Phone Note Call from Patient Call back at Home Phone [phone]   Caller: Patient Call For: Kerby Nora MD Summary of Call: Pt has not had a BM since her colonoscopy last week.  She is taking 2 laxatives every day, no movement since last wednesday. Initial call taken by: Lowella Petties CMA, AAMA,  January 27, 2010 10:05 AM  Follow-up for Phone Call        What laxatives is she using?  is she still on lactulose? let me know..she can also call GI as well. Follow-up by: Kerby Nora MD,  January 27, 2010 11:24 AM  Additional Follow-up for Phone Call Additional follow up Details #1::        Lactulose, amitra is what she is taken daily and she is using both of these 2 times daily Additional Follow-up by: Benny Lennert CMA (AAMA),  January 27, 2010 1:20 PM    Additional Follow-up for Phone Call Additional follow up Details #2::    Please have her call GI..I am unsure of what to add to these already very strong meds.  Follow-up by: Kerby Nora MD,  January 27, 2010 1:48 PM  Additional Follow-up for Phone Call Additional follow up Details #3:: Details for Additional Follow-up Action Taken: Patietn advised and will call GI for further recommendations Additional Follow-up by: Benny Lennert CMA Duncan Dull),  January 27, 2010 2:19 PM

## 2010-04-29 NOTE — Progress Notes (Signed)
Summary: Prior Authorization Lexapro  Phone Note From Pharmacy Call back at ph [phone] fax [phone]   [facility] [address] [phone]* Call For: Dr. Ermalene Searing  Summary of Call: Received fax from pharmacy stating that PA is needed for Lexapro 10mg .  Called [phone] and spoke to [place], he will fax PA form today.  Linde Gillis CMA Duncan Dull)  July 07, 2009 8:08 AM   Received PA form, in your IN box.   Initial call taken by: Linde Gillis CMA Duncan Dull),  July 07, 2009 9:01 AM     Appended Document: Prior Authorization Lexapro Completed form faxed back to [facility] at [phone].  Appended Document: Prior Authorization Lexapro Prior auth received.

## 2010-04-29 NOTE — Assessment & Plan Note (Signed)
Summary: 2 WK F/U MOOD/DLO   Vital Signs:  Patient profile:   55 year old female Height:      60 inches Weight:      99 pounds BMI:     19.40 Temp:     98.1 degrees F oral Pulse rate:   72 / minute Pulse rhythm:   regular BP sitting:   138 / 72  (left arm) Cuff size:   regular  Vitals Entered By: Benny Lennert CMA Duncan Dull) (July 22, 2009 4:16 PM)  History of Present Illness: Chief complaint 2 wk follow up mood  Depression.Marland Kitchenon lexapro 10 mg daily x 2 weeks.  Has remained off clonazepam for 1 1/2 months now.  Previously poor control on sertraline 100 mg daily.  Tried  amitryptiline for sleep for a week but gave her SE..couldn't function. Venlafaxine was ineffective.  She feel s that she is minimally better in past few weeks on lexapro..but not complaining of anxiety shakiness, more lack of motivation,  . Not seeing a counselor since initial visit...too expensive.  Some weihgt gain since last OV.   Has been able to wean of clonazepam entirely in last month.!  Problems Prior to Update: 1)  Knee Pain, Bilateral  (ICD-719.46) 2)  Cardiac Murmur  (ICD-785.2) 3)  Magnetic Resonance Imaging, Brain, Abnormal  (ICD-794.09) 4)  Other Screening Mammogram  (ICD-V76.12) 5)  Renal Calculus, Hx of  (ICD-V13.01) 6)  Allergic Rhinitis  (ICD-477.9) 7)  Hx of Venereal Wart  (ICD-078.11) 8)  Hypertension  (ICD-401.9) 9)  Common Migraine  (ICD-346.10) 10)  Depression  (ICD-311) 11)  Hx of Asthma, Intermittent, Mild  (ICD-493.90)  Current Medications (verified): 1)  Triamterene-Hctz 37.5-25 Mg Tabs (Triamterene-Hctz) .... Take 1 Tablet By Mouth Once A Day 2)  Vitamin C 1000 Mg Tabs (Ascorbic Acid) .... Once A Day 3)  Lexapro 20 Mg Tabs (Escitalopram Oxalate) .Marland Kitchen.. 1 Tab By Mouth Daily 4)  Seroquel 25 Mg Tabs (Quetiapine Fumarate) .Marland Kitchen.. 1 Tab By Mouth At Bedtime, After 1 Week Increase To 50 Mg At Bedtime.  Allergies (verified): No Known Drug Allergies  Past History:  Past medical,  surgical, family and social histories (including risk factors) reviewed, and no changes noted (except as noted below).  Past Medical History: Reviewed history from 03/05/2009 and no changes required. Depression/anxiety Hypertension Allergic rhinitis Headaches  Past Surgical History: Reviewed history from 04/02/2009 and no changes required. exploratory laproscopy for infertility C-section 187, 1989 02/2009 treadmill stress test: low risk  GYN Dr. Severiano Gilbert [facility]: Dr. Imogene Burn  Family History: Reviewed history from 03/05/2009 and no changes required. father: HTN deceased (in his 58's) mother: HTN, CAD deceased at age 51 with CHF brother: arrythmia, HTN, sleep  sister: liver failure unknown cause 6 siblings total no cancer  Social History: Reviewed history from 10/23/2008 and no changes required. 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 CV:  Denies chest pain or discomfort. Resp:  Denies shortness of breath. GI:  Denies abdominal pain. GU:  Denies dysuria.  Physical Exam  General:  Well-developed,well-nourished,in no acute distress; alert,appropriate and cooperative throughout examination Mouth:  MMM Neck:  no carotid bruit or thyromegaly  Lungs:  Normal respiratory effort, chest expands symmetrically. Lungs are clear to auscultation, no crackles or wheezes. Heart:  Normal rate and regular rhythm. S1 and S2 normal without gallop, murmur, click, rub or  other extra sounds. Abdomen:  Bowel sounds positive,abdomen soft and non-tender without masses, organomegaly or hernias noted. Pulses:  R and L posterior tibial pulses are full and equal bilaterally  Extremities:  No clubbing, cyanosis, edema, or deformity noted with normal full range of motion of all joints.   Psych:   Oriented X3, memory intact for recent and remote, normally interactive, good eye contact, and slightly anxious.     Impression & Recommendations:  Problem # 1:  DEPRESSION (ICD-311) Poor control..discussed options in detail.  Spent 30 min with patient face to face.  She is very frustrated and is wondering if she should go back on all the medicaiotns she was on in [past. Of note in review of 2006 hopsitatlization..it appears she did have pshycotic features to her depression and was temporarily on depakote, risperdal and contionued on seroquel.  offered again to refer her to a different pshychiatrist..she declined at this time. No SI, no HI. Will increase to make lexapro and add back seroquel as adjunct for depression/insomnia. Given past ? SE/sedation with seroquel will start 25 mg daily and increase to 50 mg after 1 week. MAy also consider changing to seroquel XR if doing well on it.  Her updated medication list for this problem includes:    Lexapro 20 Mg Tabs (Escitalopram oxalate) .Marland Kitchen... 1 tab by mouth daily  Complete Medication List: 1)  Triamterene-hctz 37.5-25 Mg Tabs (Triamterene-hctz) .... Take 1 tablet by mouth once a day 2)  Vitamin C 1000 Mg Tabs (Ascorbic acid) .... Once a day 3)  Lexapro 20 Mg Tabs (Escitalopram oxalate) .Marland Kitchen.. 1 tab by mouth daily 4)  Seroquel 25 Mg Tabs (Quetiapine fumarate) .Marland Kitchen.. 1 tab by mouth at bedtime, after 1 week increase to 50 mg at bedtime.  Patient Instructions: 1)  Increase lexapro to 20 mg daily. 2)  Add back seroquel at bedtime. Follow up in 2 weeks for mood.  Prescriptions: SEROQUEL 25 MG TABS (QUETIAPINE FUMARATE) 1 tab by mouth at bedtime, after 1 week increase to 50 mg at bedtime.  #60 x 11   Entered and Authorized by:   Kerby Nora MD   Signed by:   Kerby Nora MD on 07/22/2009   Method used:   [facility]        [facility] [phone][facility] (retail)       [address] [place], [place]  [address]       Ph:  [phone] or [phone]       Fax: [phone]   RxID:   [id number]   Current Allergies (reviewed today): No known allergies

## 2010-04-29 NOTE — Progress Notes (Signed)
Summary: new med  Phone Note Call from Patient Call back at [facility] Phone [phone]   Caller: Patient Call For: Kerby Nora MD/ Herbert Seta Summary of Call: Patient wants phone call from [facility]. She says that her new med is not working and things are not  going very well.  Initial call taken by: Melody Comas,  June 11, 2009 10:00 AM  Follow-up for Phone Call        Stop effexor and return to previous dose of sertraline. Refill if needed.  Also stop trazodone if not helping with sleep. Once feeling better on sertraline..I would suggest adding amitryptiline for sleep if she has not had bad experience in past. let me know if she is aggreable.  If suicidal ideation..needs to be seen ASAP.   Correct med list to reflect.  Follow-up by: Kerby Nora MD,  June 11, 2009 1:54 PM  Additional Follow-up for Phone Call Additional follow up Details #1::        Patient not having in any suicidal thought and agreeable to medication changes Additional Follow-up by: Benny Lennert CMA Duncan Dull),  June 11, 2009 2:15 PM    New/Updated Medications: SERTRALINE HCL 100 MG TABS (SERTRALINE HCL) Take 1 tablet by mouth once a day AMITRIPTYLINE HCL 25 MG TABS (AMITRIPTYLINE HCL) 1 tab by mouth at bedtime as needed insomnia , if not effective..can increase to 2 tab by mouth at bedtime Prescriptions: SERTRALINE HCL 100 MG TABS (SERTRALINE HCL) Take 1 tablet by mouth once a day  #30 x 5   Entered and Authorized by:   Kerby Nora MD   Signed by:   Kerby Nora MD on 06/11/2009   Method used:   [facility] to        [facility] [phone][facility] (retail)       [address] [place], [place]  [address]       Ph: [phone] or [phone]       Fax: [phone]   RxID:   [id number] AMITRIPTYLINE HCL 25 MG TABS (AMITRIPTYLINE HCL) 1 tab by mouth at bedtime as needed insomnia , if not effective..can increase to 2 tab by mouth at bedtime  #30 x 3   Entered and Authorized by:   Kerby Nora  MD   Signed by:   Kerby Nora MD on 06/11/2009   Method used:   Electronically to        [facility] [phone]* (retail)       [address] [place], [place]  [address]       Ph: [phone] or [phone]       Fax: [phone]   RxID:   [id number]   Current Allergies (reviewed today): No known allergies

## 2010-04-29 NOTE — Progress Notes (Signed)
Summary: cough , congestion  Phone Note Call from Patient Call back at [facility] Phone [phone]   Caller: Patient Call For: Kerby Nora MD Summary of Call: Pt complains of head congestion, sore throat, earache, chest congestion and cough.  No fever.  She is taking advil for her throat but nothing else.  I suggested that she take mucinex.  She wants to come in today, advised her  that she can call [facility] to see if they have appts available, but she declined.  Also, she is weaning off clonazepam. She had to go back to taking one half of this two times a day due to having a lot of anxiety. She says she didnt tolerate the trazadone- it caused a headache and didnt help.  [facility] [address]  Please advise on what she can do for URI sxs. Initial call taken by: Lowella Petties CMA,  May 26, 2009 9:57 AM  Follow-up for Phone Call        All reasonable suggestions. Follow-up by: Hannah Beat MD,  May 26, 2009 5:18 PM

## 2010-04-29 NOTE — Progress Notes (Signed)
Summary: constipation  Phone Note Call from Patient Call back at Home Phone [phone]   Caller: Patient Call For: Dr. Jarold Motto Reason for Call: Talk to Nurse Summary of Call: recent COL... pt states she was told by Dr. Jarold Motto to call back if she hasnt had a BM in several days after COL... pt has not had a BM since procedure Initial call taken by: Vallarie Mare,  January 28, 2010 11:44 AM  Follow-up for Phone Call        [facility] BM since colon on 01/20/10.  Taking Amitiza 8 micrograms two times a day and lactulose 2 tbsp two times a day.  No BM at all.  Denies pain or discomfort.  She is passing gas.  Dr Jarold Motto please advise. Follow-up by: Darcey Nora RN, CGRN,  January 28, 2010 1:44 PM  Additional Follow-up for Phone Call Additional follow up Details #1::        MIRALAX 8OZS two times a day,AMITIZA 24 MCG two times a day AND SENNA TABS 2 two times a day AND as needed 10 MG DULCOLAX SUPP at bedtime...SCHEDULE SITZ MARKERS ON RX,  Additional Follow-up by: Mardella Layman MD Niel Hummer 2:26 PM    Additional Follow-up for Phone Call Additional follow up Details #2::    Patient  advised she will come by tomorrow to take SITZ mark capsule.  She will come at 10:00 and ask for me.  New RX sent to her pharmacy.   Follow-up by: Darcey Nora RN, CGRN,  January 28, 2010 2:52 PM  Additional Follow-up for Phone Call Additional follow up Details #3:: Details for Additional Follow-up Action Taken: Patient  was given the Sitz Mark capsule to take.  She states she needs to take it with applesauce so she will take it at home.  She will come back for KUB 02/03/10 10:00 Additional Follow-up by: Darcey Nora RN, CGRN,  January 29, 2010 10:26 AM  New/Updated Medications: AMITIZA 24 MCG CAPS (LUBIPROSTONE) 1 by mouth two times a day MIRALAX  POWD (POLYETHYLENE GLYCOL 3350) 17 gm in 8 oz water two times a day SENNA 187 MG TABS (SENNA) 2 by mouth two times a day  prn Prescriptions: AMITIZA 24 MCG CAPS (LUBIPROSTONE) 1 by mouth two times a day  #60 x 3   Entered by:   Darcey Nora RN, CGRN   Authorized by:   Mardella Layman MD [facility]   Signed by:   Darcey Nora RN, CGRN on 01/28/2010   Method used:   [facility]        [facility] [address] [facility] [phone]* (retail)       [address] [place], [place]  [address]       Ph: [phone]       Fax: [phone]   RxID:   [id number]

## 2010-04-29 NOTE — Progress Notes (Signed)
Summary: regarding sertraline  Phone Note Call from Patient Call back at [facility] Phone [phone]   Caller: Patient Call For: Kerby Nora MD Summary of Call: Pt states sertraline is not working for her.  She says her mood is terrible and her legs are shaking all the time.  Do you want her to stop this?  She has an appt to see you on 4/13.  She is not taking any other antidepressants at this time. Initial call taken by: Lowella Petties CMA,  June 30, 2009 8:56 AM  Follow-up for Phone Call        Please work her in to a sooner appt with ME this week. Continue sertraline until then.  Follow-up by: Kerby Nora MD,  June 30, 2009 11:07 AM  Additional Follow-up for Phone Call Additional follow up Details #1::        [facility] asking pt to call.            Lowella Petties CMA  June 30, 2009 12:45 PM Pt has scheduled appt for 07/04/09. Additional Follow-up by: Lowella Petties CMA,  June 30, 2009 5:01 PM

## 2010-04-29 NOTE — Progress Notes (Signed)
Summary: Questions about medications  Phone Note Call from Patient Call back at [phone]   Caller: Patient Call For: Kerby Nora MD Summary of Call: Patient wants to know if she needs to keep the follow-up appt that she cancelled back in June?  Patient is taking Seroquel which does not seem to be working, mood seems to be getting worse than better.  Can you make an adjustment on her medication over the phone? [facility] [address] Initial call taken by: Sydell Axon LPN,  September 30, 1608 11:01 AM  Follow-up for Phone Call        Increase mirtazapine to 30 mg daily, find out what dose of Seroquel...we can try to increase if able...needs follow up in 1 month.  other option is referral to [facility] if she is interested.  Follow-up by: Kerby Nora MD,  September 30, 2009 11:40 AM  Additional Follow-up for Phone Call Additional follow up Details #1::        She is taking the seroquel 25mg  . Patient wants to know if the 30 mg of mirtazapine would cause her to gain weight. She never started taking mirtazapine before when when you prescribed.Consuello Masse CMA   Additional Follow-up by: Benny Lennert CMA Duncan Dull),  September 30, 2009 11:46 AM    Additional Follow-up for Phone Call Additional follow up Details #2::    Some weight gain associated, but this should not be issue for her given her weight loss.....have her start 15 mg mirtazapine I prescribed and increase seroquel to 50 mg at bedtime....please update med list.  Follow up 1 month. Follow-up by: Kerby Nora MD,  September 30, 2009 1:20 PM  Additional Follow-up for Phone Call Additional follow up Details #3:: Details for Additional Follow-up Action Taken: Patient advised.Consuello Masse CMA

## 2010-04-29 NOTE — Letter (Signed)
Summary: [facility] Instructions  [facility]  [address] [place], [place] [address]   Phone: [phone]  Fax: [phone]       Theresa Martin    1955/12/16    MRN: [id number]        Procedure Day /Date: 01/21/2010 Wednesday     Arrival Time: 9:00am     Procedure Time: 10:00am     Location of Procedure:                    X  [facility] (4th Floor) .                       PREPARATION FOR COLONOSCOPY WITH MOVIPREP   Starting 5 days prior to your procedure 01/16/2010 (TODAY) do not eat nuts, seeds, popcorn, corn, beans, peas,  salads, or any raw vegetables.  Do not take any fiber supplements (e.g. Metamucil, Citrucel, and Benefiber).   THE TWO DAYS BEFORE YOUR PROCEDURE         DATE: 01/19/2010  DAY: Monday  Buy a Bottle of Magnesium Citrate from your Pharmacy.  1.  Drink clear liquids the entire day-NO SOLID FOOD  2.  Do not drink anything colored red or purple.  Avoid juices with pulp.  No orange juice.  3.  Drink at least 64 oz. (8 glasses) of fluid/clear liquids during the day to prevent dehydration and help the prep work efficiently.  CLEAR LIQUIDS INCLUDE: Water Jello Ice Popsicles Tea (sugar ok, no milk/cream) Powdered fruit flavored drinks Coffee (sugar ok, no milk/cream) Gatorade Juice: apple, white grape, white cranberry  Lemonade Clear bullion, consomm, broth Carbonated beverages (any kind) Strained chicken noodle soup Hard Candy  4. Drink the bottle of Magnesium Citrate. You can have Clear liquids until bedtime.  THE DAY BEFORE YOUR PROCEDURE                          DATE: 01/20/2010 DAY: Tuesday                    1. Remain on clear liquids all day.            2.In the morning, mix first dose of MoviPrep solution:    Empty 1 Pouch A and 1 Pouch B into the disposable container    Add lukewarm drinking water to the top line of the container. Mix to dissolve    Refrigerate (mixed solution should be used within  24 hrs)  5.  Begin drinking the prep at 5:00 p.m. The MoviPrep container is divided by 4 marks.   Every 15 minutes drink the solution down to the next mark (approximately 8 oz) until the full liter is complete.   6.  Follow completed prep with 16 oz of clear liquid of your choice (Nothing red or purple).  Continue to drink clear liquids until bedtime.  7.  Before going to bed, mix second dose of MoviPrep solution:    Empty 1 Pouch A and 1 Pouch B into the disposable container    Add lukewarm drinking water to the top line of the container. Mix to dissolve    Refrigerate  THE DAY OF YOUR PROCEDURE      DATE: 01/21/2010 DAY: Wednesday  Beginning at 5:00am (5 hours before procedure):         1. Every 15 minutes, drink the solution down to the next mark (approx 8  oz) until the full liter is complete.  2. Follow completed prep with 16 oz. of clear liquid of your choice.    3. You may drink clear liquids until 8:00am (2 HOURS BEFORE PROCEDURE).   MEDICATION INSTRUCTIONS  Unless otherwise instructed, you should take regular prescription medications with a small sip of water   as early as possible the morning of your procedure.         OTHER INSTRUCTIONS  You will need a responsible adult at least 56 years of age to accompany you and drive you home.   This person must remain in the waiting room during your procedure.  Wear loose fitting clothing that is easily removed.  Leave jewelry and other valuables at home.  However, you may wish to bring a book to read or  Theresa iPod/MP3 player to listen to music as you wait for your procedure to start.  Remove all body piercing jewelry and leave at home.  Total time from sign-in until discharge is approximately 2-3 hours.  You should go home directly after your procedure and rest.  You can resume normal activities the  day after your procedure.  The day of your procedure you should not:   Drive   Make legal decisions   Operate  machinery   Drink alcohol   Return to work  You will receive specific instructions about eating, activities and medications before you leave.    The above instructions have been reviewed and explained to me by   _______________________    I fully understand and can verbalize these instructions _____________________________ Date _________

## 2010-04-29 NOTE — Assessment & Plan Note (Signed)
Summary: Theresa Martin   Vital Signs:  Patient profile:   55 year old female Height:      60 inches Weight:      106.0 pounds BMI:     20.78 Temp:     97.9 degrees F oral Pulse rate:   72 / minute Pulse rhythm:   regular BP sitting:   130 / 80  (left arm) Cuff size:   regular  Vitals Entered By: Benny Lennert CMA Duncan Dull) (October 29, 2009 12:02 PM)  History of Present Illness: Chief complaint follow up   Depresson, anxiety, insomnia: Since last OV.. started 15 mg mirtazapine I prescribed and increase seroquel to 50 mg at bedtime. Continues to have spells of up and down.. She is frustrated, tearful. She states she does have so mauch stress in her marriage and at work...she does not want to go back to work from summer off. She states 'this year something has go to give.Marland Kitchenand it will probably be my marriage" Some nights sleeping well, other nights not.   Family says she is actually  more like her true self on these meds..she states she still always wants to be that not stop energized hypomanic person, but recognizes that it may not be healthy.   Headaches are back...worse than before...missed June appt.  Trigger point injections helped significantly in past.  Exercising daily.  Alot of stress, marriage issues.       Problems Prior to Update: 1)  Knee Pain, Bilateral  (ICD-719.46) 2)  Cardiac Murmur  (ICD-785.2) 3)  Magnetic Resonance Imaging, Brain, Abnormal  (ICD-794.09) 4)  Other Screening Mammogram  (ICD-V76.12) 5)  Renal Calculus, Hx of  (ICD-V13.01) 6)  Allergic Rhinitis  (ICD-477.9) 7)  Hx of Venereal Wart  (ICD-078.11) 8)  Hypertension  (ICD-401.9) 9)  Common Migraine  (ICD-346.10) 10)  Depression  (ICD-311) 11)  Hx of Asthma, Intermittent, Mild  (ICD-493.90)  Current Medications (verified): 1)  Triamterene-Hctz 37.5-25 Mg Tabs (Triamterene-Hctz) .... Take 1 Tablet By Mouth Once A Day 2)  Vitamin C 1000 Mg Tabs (Ascorbic Acid) .... Once A Day 3)  Mirtazapine 30 Mg  Tbdp (Mirtazapine) .Marland Kitchen.. 1 Tab By Mouth Daily 4)  Seroquel 25 Mg Tabs (Quetiapine Fumarate) .Marland Kitchen.. 1 Tab By Mouth At Bedtime, After 1 Week Increase To 50 Mg At Bedtime.  Allergies (verified): No Known Drug Allergies  Past History:  Past medical, surgical, family and social histories (including risk factors) reviewed, and no changes noted (except as noted below).  Past Medical History: Reviewed history from 03/05/2009 and no changes required. Depression/anxiety Hypertension Allergic rhinitis Headaches  Past Surgical History: Reviewed history from 04/02/2009 and no changes required. exploratory laproscopy for infertility C-section 187, 1989 02/2009 treadmill stress test: low risk  GYN Dr. Severiano Gilbert P[facility]: Dr. Imogene Burn  Family History: Reviewed history from 03/05/2009 and no changes required. father: HTN deceased (in his 64's) mother: HTN, CAD deceased at age 30 with CHF brother: arrythmia, HTN, sleep  sister: liver failure unknown cause 6 siblings total no cancer  Social History: Reviewed history from 10/23/2008 and no changes required. 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 CV:  Denies chest pain or discomfort. Resp:  Denies shortness of breath.  Physical Exam  General:  Well-developed,well-nourished,in no acute distress; alert,appropriate and cooperative throughout examination Mouth:  Oral mucosa and oropharynx without lesions or exudates.  Teeth in good repair. Lungs:  Normal respiratory effort, chest expands symmetrically. Lungs are clear to auscultation, no crackles or wheezes. Heart:  Normal rate and regular rhythm. S1 and S2 normal without gallop, murmur, click, rub or other extra sounds. Psych:  Oriented X3, memory intact for recent and remote, normally interactive, good  eye contact, and slightly anxious, occ tearful during discussion   Impression & Recommendations:  Problem # 1:  DEPRESSION (ICD-311) Increase mirtazapine to 30 mg daily. Seroquel may be triggering migraines..she plans to follow up with neuro. If he feels this could be the case, we can consider weaning, but she is sleeping better with this than in the past. (When on Seroquel one year ago...she was having migraine...this may or may not be connected" Recommended counselor to discuss stress reduction and relaxation techniques. Consider marriage counseling. Close follow up in 1 month.  Given her complicated history, I once again recommended her to consider referral to pshyciatry, but she is hesitant given past experience. This may be necessary if we continue to not be able to find an adequate regimen for her. Her updated medication list for this problem includes:    Mirtazapine 30 Mg Tbdp (Mirtazapine) .Marland Kitchen... 1 tab by mouth daily   Orders: Psychology Referral (Psychology)  Complete Medication List: 1)  Triamterene-hctz 37.5-25 Mg Tabs (Triamterene-hctz) .... Take 1 tablet by mouth once a day 2)  Vitamin C 1000 Mg Tabs (Ascorbic acid) .... Once a day 3)  Mirtazapine 30 Mg Tbdp (Mirtazapine) .Marland Kitchen.. 1 tab by mouth daily 4)  Seroquel 25 Mg Tabs (Quetiapine fumarate) .Marland Kitchen.. 1 tab by mouth at bedtime, after 1 week increase to 50 mg at bedtime.  Patient Instructions: 1)  Marion..can you please call to see if we can get her in to see neurologist in next few week..sees Dr. Danae Orleans...earliest she was told is end of August. 2)  Referral Appointment Information 3)  Day/Date: 4)  Time: 5)  Place/MD: 6)  Address: 7)  Phone/Fax: 8)  Patient given appointment information. Information/Orders faxed/mailed.  9)  Increase mirtazapine to 30 mg daily. 10)   Follow up in 1 month 30 min. Prescriptions: MIRTAZAPINE 30 MG TBDP (MIRTAZAPINE) 1 tab by mouth daily  #30 x 3   Entered and Authorized by:   Kerby Nora  MD   Signed by:   Kerby Nora MD on 10/29/2009   Method used:   [facility] [phone]* (retail)       [address] [place], [place]  [address]       Ph: [phone] or [phone]       Fax: [phone]   RxID:   [id number]   Current Allergies (reviewed today): No known allergies

## 2010-04-29 NOTE — Progress Notes (Signed)
Summary: Swallow test results  Phone Note Call from Patient Call back at Home Phone [phone]   Call For: Dr Jarold Motto Summary of Call: Had a test where she swallowed a capsule and has not heard back on the results Initial call taken by: Leanor Kail [facility],  February 09, 2010 1:05 PM  Follow-up for Phone Call        patient aware and states that she is on 4 laxatives and has had only loose stools for the past couple days.  she is ok with the referral to [facility] so I will refer and call her back with that appt.  1.patient wants to know if she needs to continue the four laxatives?  2. She also would like to know if her Seroqeul could be making her have more constipation?  Follow-up by: Harlow Mares CMA Duncan Dull),  February 09, 2010 1:56 PM  Additional Follow-up for Phone Call Additional follow up Details #1::        CONTINUE ALL MEDS..?? SEROQUIL.Marland KitchenMarland KitchenCHECK WITH PSYCH Additional Follow-up by: Mardella Layman MD FACG,  February 09, 2010 2:37 PM     Appended Document: Swallow test results patient decided not to go to [facility] she will wait and see how managing her meds and she will call back if she needs to be referred. She also tells me due to the stool leakage she has stopped everything except her Amitiza. She also wants me to send all information to Dr. Genevieve Norlander.    Clinical Lists Changes  Medications: Removed medication of MIRALAX  POWD (POLYETHYLENE GLYCOL 3350) 17 gm in 8 oz water two times a day Removed medication of SENNA 187 MG TABS (SENNA) 2 by mouth two times a day Removed medication of LACTULOSE 10 GM/15ML SOLN (LACTULOSE) two tablespoons  two times a day as needed constipation

## 2010-04-29 NOTE — Progress Notes (Signed)
Summary: regarding trazadone  Phone Note Call from Patient Call back at [facility] Phone [phone]   Caller: Patient Call For: Theresa Nora MD Summary of Call: Pt is concerned about taking trazadone, her pt information says this is an anti depressant and she doesnt want another anti depressant.  I advised her that it is used for anxiety as well and should help her sleep.  She wants your opinion on that and also if she should go back to an increased dose of klonopin and skip the trazadone.  Please advise. Initial call taken by: Lowella Petties CMA,  May 12, 2009 3:56 PM  Follow-up for Phone Call        Trazodone in  past was used as antidepressant at higher doses..now used only for sleep at lower doses.   I am not using this in her as antidepressant...it is safer, less habit forming than klonopin..continue to wean off klonpin.  Follow-up by: Theresa Nora MD,  May 13, 2009 9:27 AM  Additional Follow-up for Phone Call Additional follow up Details #1::        Patient advised.Consuello Masse CMA  Additional Follow-up by: Benny Lennert CMA Duncan Dull),  May 13, 2009 9:40 AM

## 2010-04-29 NOTE — Assessment & Plan Note (Signed)
Summary: CHRONIC CONSTIPATION/YF.   History of Present Illness Visit Type: consult Primary GI MD: Sheryn Bison MD FACP FAGA Primary Ginni Eichler: Kerby Nora, MD Requesting Treva Huyett: Kerby Nora, MD Chief Complaint: constipation History of Present Illness:   Complex 55 year old Caucasian female referred by Dr. Pattricia Boss and also by her urologist Dr. Artis Flock in [facility] for worsening constipation initially discovered at the time of CT scan which was performed because of lower abdominal pain. 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 To me, she current denies any genitourinary complaints. At the time of her CT scan she was found to have rather marked constipation, and subsequently noticed gas, bloating, and vague abdominal pain. Surprisingly, she denied constipation problems before being told that she was constipated. She still relates she has a daily soft bowel movement.  She has severe depression and apparently has been hospitalized recently for several weeks by Dr. Imogene Burn and is on very large doses currently on Seroquel 200 mg one tablet twice a day and 2 at bedtime. She also takes Remeron, Ativan, and lithium. She is on triamterene-HCTZ for peripheral edema and has recently been using lactulose 10 g 3 times a day. She has not had previous colonoscopy or barium enema. She denies rectal bleeding, rectal pain, upper GI or hepatobiliary complaints. She follows a regular diet and denies any anorexia, weight loss, or food intolerances.   GI Review of Systems    Reports abdominal pain, acid reflux, belching, bloating, heartburn, and  weight gain.      Denies chest pain, dysphagia with liquids, dysphagia with solids, loss of appetite, nausea, vomiting, vomiting blood, and  weight loss.      Reports constipation.     Denies anal fissure, black tarry stools, change in bowel habit, diarrhea, diverticulosis, fecal incontinence, heme positive stool, hemorrhoids, irritable  bowel syndrome, jaundice, light color stool, liver problems, rectal bleeding, and  rectal pain.    Current Medications (verified): 1)  Triamterene-Hctz 37.5-25 Mg Tabs (Triamterene-Hctz) .... Take 1 Tablet By Mouth Once A Day 2)  Ativan 1 Mg Tabs (Lorazepam) .... Take On Tablet Every 4 Hours 3)  Lithium Carbonate 300 Mg Caps (Lithium Carbonate) .... One Tablet By Mouth 2 Times Daily 4)  Seroquel 200 Mg Tabs (Quetiapine Fumarate) .... Take One Tablet Two Times A Day and Take 2 Tablets in The Evening 5)  Remeron 30 Mg Tabs (Mirtazapine) .... One Time Daily 6)  Lactulose 10 Gm/41ml Soln (Lactulose) .... 30 Ml Two Times A Day As Needed Constipation  Allergies (verified): No Known Drug Allergies  Past History:  Past medical, surgical, family and social histories (including risk factors) reviewed for relevance to current acute and chronic problems.  Past Medical History: Depression/anxiety Hypertension Allergic rhinitis Headaches Kidney Stones Pneumonia  Past Surgical History: Reviewed history from 04/02/2009 and no changes required. exploratory laproscopy for infertility C-section 187, 1989 02/2009 treadmill stress test: low risk  GYN Dr. Severiano Gilbert P[facility]: Dr. Imogene Burn  Family History: Reviewed history from 03/05/2009 and no changes required. father: HTN deceased (in his 15's) mother: HTN, CAD deceased at age 100 with CHF brother: arrythmia, HTN, sleep  sister: liver failure unknown cause 6 siblings total no cancer No FH of Colon Cancer:  Social History: Reviewed history from 10/23/2008 and no changes required. 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 Never Smoked Alcohol use-yes, rare Drug use-no Regular exercise-yes, daily running, elliptical Diet: fruits and veggies, water  Review of Systems  The patient denies allergy/sinus, anemia, anxiety-new, arthritis/joint pain,  back pain, blood in urine, breast changes/lumps, confusion, cough, coughing up blood,  depression-new, fainting, fatigue, fever, headaches-new, hearing problems, heart murmur, heart rhythm changes, itching, menstrual pain, muscle pains/cramps, night sweats, nosebleeds, pregnancy symptoms, shortness of breath, skin rash, sleeping problems, sore throat, swelling of feet/legs, swollen lymph glands, thirst - excessive, urination - excessive, urination changes/pain, urine leakage, vision changes, and voice change.         She Relates that her constipation may be related to her Seroquel which is being given in rather large doses. She is attempting to taper this medication as tolerated.  Vital Signs:  Patient profile:   55 year old female Height:      60 inches Weight:      116 pounds BMI:     22.74 Pulse rate:   60 / minute Pulse rhythm:   regular BP sitting:   120 / 70  (left arm) Cuff size:   regular  Vitals Entered By: Francee Piccolo CMA Duncan Dull) (January 16, 2010 9:40 AM)  Physical Exam  General:  Well developed, well nourished, no acute distress.healthy appearing.   Head:  Normocephalic and atraumatic. Eyes:  PERRLA, no icterus.exam deferred to patient's ophthalmologist.   Neck:  Supple; no masses or thyromegaly. Lungs:  Clear throughout to auscultation. Heart:  Regular rate and rhythm; no murmurs, rubs,  or bruits. Abdomen:  Mild abdominal distention noted without visible bowel loops or other abnormalities. There is no hepatosplenomegaly, abdominal masses or localized tenderness. Bowel sounds are very hypoactive. Rectal:  rectal exam shows hard impacted stool in the rectal vault which is guaiac negative. Pulses:  Normal pulses noted. Extremities:  No clubbing, cyanosis, edema or deformities noted. Neurologic:  Alert and  oriented x4;  grossly normal neurologically. Cervical Nodes:  No significant cervical adenopathy. Inguinal Nodes:  No significant inguinal adenopathy. Psych:  Alert and cooperative. Normal mood and affect.   Impression & Recommendations:  Problem  # 1:  CONSTIPATION (ICD-564.00) Assessment Deteriorated Continue lactulose and we'll try Amitiza 8 micrograms twice a day. She's been scheduled for colonoscopy with a double bowel prep. I suspect her constipation is functional in nature or perhaps related to her medications. Screening labs including thyroid function tests have also been ordered. Orders: TLB-CBC Platelet - w/Differential (85025-CBCD) TLB-CMP (Comprehensive Metabolic Pnl) (80053-COMP) TLB-Hepatic/Liver Function Pnl (80076-HEPATIC) TLB-TSH (Thyroid Stimulating Hormone) (84443-TSH) TLB-B12, Serum-Total ONLY (13086-V78) TLB-Ferritin (82728-FER) TLB-Folic Acid (Folate) (82746-FOL) TLB-IBC Pnl (Iron/FE;Transferrin) (83550-IBC) TLB-Sedimentation Rate (ESR) (85652-ESR) T-igA (46962) T-Sprue Panel (Celiac Disease Aby Eval) (83516x3/[address]) Colonoscopy (Colon)  Problem # 2:  ABDOMINAL BLOATING (ICD-787.3) Assessment: Deteriorated  Problem # 3:  RENAL CALCULUS, HX OF (ICD-V13.01) Assessment: Unchanged urologic followup as scheduled.  Problem # 4:  DEPRESSION (ICD-311) Assessment: Improved continue medications as per psychiatry.  Patient Instructions: 1)  Copy sent to : Kerby Nora, MD 2)  Please go to the basement today for your labs.  3)  Your prescription(s) have been sent to you pharmacy.  4)  Your procedure has been scheduled for 01/21/2010, please follow the seperate instructions.  5)  [facility] Patient Information Guide given to patient.  6)  Colonoscopy and Flexible Sigmoidoscopy brochure given.  7)  The medication list was reviewed and reconciled.  All changed / newly prescribed medications were explained.  A complete medication list was provided to the patient / caregiver. 8)  Constipation and Hemorrhoids brochure given.  Prescriptions: AMITIZA 8 MCG CAPS (LUBIPROSTONE) take one by mouth two times a day  #60 x 3  Entered by:   Harlow Mares CMA (AAMA)   Authorized by:   Mardella Layman MD  [facility]   Signed by:   Harlow Mares CMA (AAMA) on 01/16/2010   Method used:   [facility]        [facility] [phone][facility] (retail)       [address] [place], [place]  [address]       Ph: [phone] or [phone]       Fax: [phone]   RxID:   [id number] MOVIPREP 100 GM  SOLR (PEG-KCL-NACL-NASULF-NA ASC-C) As per prep instructions.  #1 x 0   Entered by:   Harlow Mares CMA (AAMA)   Authorized by:   Mardella Layman MD [facility]   Signed by:   Harlow Mares CMA (AAMA) on 01/16/2010   Method used:   [facility] to        [facility] [phone]* (retail)       [address] [place], [place]  [address]       Ph: [phone] or [phone]       Fax: [phone]   RxID:   [id number]

## 2010-04-29 NOTE — Letter (Signed)
Summary: E-Mail from Dr.Jane Rudean Curt from Dr.Jane Atwood   Imported By: Beau Fanny 11/11/2009 13:53:18  _____________________________________________________________________  External Attachment:    Type:   Image     Comment:   External Document

## 2010-04-29 NOTE — Progress Notes (Signed)
Summary: Mental issues  Phone Note Call from Patient Call back at [phone]   Caller: Sister/Pat Call For: Kerby Nora MD Summary of Call: Patient's mental state has gotten really bad over the last few days. Patient is not sleeping unless she is heavily sedated. Family can not leave her by herself because she is not aware of what she is doing. Patient has just separated from her husband and just started back to school this week and needs to be taken out of work because of this. They contacted Dr. Nicky Pugh office and he can not see her until tomorrow morning and he told  them to call Dr. Daphine Deutscher office to see if she can see her today to take her out of work. Patient has seen Dr. Laymond Purser once for her mental issues. Please advise. Initial call taken by: Sydell Axon LPN,  November 17, 2009 9:36 AM  Follow-up for Phone Call        I can see her today and address taking her out of work. Put on my schedule.   This sounds like it is basically a psych emergency -- she absolutely has to see Dr. Imogene Burn.  Follow-up by: Hannah Beat MD,  November 17, 2009 9:41 AM  Additional Follow-up for Phone Call Additional follow up Details #1::        Rene Kocher and I talked about this more, and to me this sounds like an acute psychiatric emergency. I think she needs psychiatric evaluation, and I have recommended that the patient go to [facility] where Dr. Imogene Burn admits for evaluation. Additional Follow-up by: Hannah Beat MD,  November 17, 2009 9:50 AM     Appended Document: Mental issues Patient's sister notified as instructed by telephone per Dr. Patsy Lager.  Appended Document: Mental issues Please let Dr. Laymond Purser know of this.Marland KitchenMarland KitchenI got an note saying she was trying to contact the pt.  Appended Document: Mental issues Shirlee Limerick to [facility] at dr perrions office about patient and have dr Charlynn Grimes contact us.Consuello Masse CMA

## 2010-04-29 NOTE — Progress Notes (Signed)
Summary: regarding lexapro  Phone Note Call from Patient Call back at [facility] Phone [phone]   Caller: Patient Call For: Kerby Nora MD Summary of Call: Pt was given samples of lexapro, which she is now out of.  She has been taking increased dose for about 2 weeks.  She states this is not helping at all.  She is asking if she needs to go back to mirazapine instead. She took her last lexapro on saturday night.  [facility] [address] Initial call taken by: Lowella Petties CMA,  Aug 04, 2009 9:50 AM  Follow-up for Phone Call        yes...can change back to mirtazapine that shewas on previously. if she is still not sleeping at night...she can increase seroqeul up to 50 mg if she has not already. Follow-up by: Kerby Nora MD,  Aug 04, 2009 10:22 AM  Additional Follow-up for Phone Call Additional follow up Details #1::        patietn advised by message on personal cell phone Additional Follow-up by: Benny Lennert CMA Duncan Dull),  Aug 04, 2009 10:27 AM    New/Updated Medications: MIRTAZAPINE 30 MG TABS (MIRTAZAPINE) 1 tab by mouth daily Prescriptions: MIRTAZAPINE 30 MG TABS (MIRTAZAPINE) 1 tab by mouth daily  #30 x 11   Entered and Authorized by:   Kerby Nora MD   Signed by:   Kerby Nora MD on 08/04/2009   Method used:   [facility] to        [facility] [phone]* (retail)       [address] [place], [place]  [address]       Ph: [phone] or [phone]       Fax: [phone]   RxID:   [id number]

## 2010-08-20 LAB — BASIC METABOLIC PANEL: Glucose: 85 mg/dL

## 2010-08-20 LAB — LIPID PANEL: Cholesterol: 254 mg/dL — AB (ref 0–200)

## 2010-09-26 ENCOUNTER — Encounter: Payer: Self-pay | Admitting: Family Medicine

## 2010-09-29 ENCOUNTER — Encounter: Payer: Self-pay | Admitting: Family Medicine

## 2010-09-29 ENCOUNTER — Ambulatory Visit (INDEPENDENT_AMBULATORY_CARE_PROVIDER_SITE_OTHER): Payer: BC Managed Care – PPO | Admitting: Family Medicine

## 2010-09-29 VITALS — BP 120/70 | HR 77 | Temp 97.9°F | Ht 61.0 in | Wt 122.0 lb

## 2010-09-29 DIAGNOSIS — E785 Hyperlipidemia, unspecified: Secondary | ICD-10-CM | POA: Insufficient documentation

## 2010-09-29 DIAGNOSIS — E7800 Pure hypercholesterolemia, unspecified: Secondary | ICD-10-CM

## 2010-09-29 NOTE — Progress Notes (Signed)
  Subjective:    Patient ID: Theresa Martin, female    DOB: 1955/09/01, 55 y.o.   MRN: [id number]  HPI  55 year old female with recent weight gain on multiple psychiatric medications... Seen by Dr. Imogene Burn. Has gained 20 lbs.  On seroquel, remeron, lamictal and klonopin.  Going through divorce so it would be inadvisable to charge her medicaitons at this time.    Recent cholesterol evaluation at that office showed : Cholesterol increased form last check likely due to being on seroquel.   Total 254 HDL 57 LDL 181 A1C 5.4  She is exercising every day and eating very low fat foods. Grilled chicken, soy milk, minimal cheese, shellfish.  Taking fish oil 200 mg.   Review of Systems  Constitutional: Negative for fever and fatigue.  HENT: Negative for ear pain.   Eyes: Negative for pain.  Respiratory: Negative for chest tightness and shortness of breath.   Cardiovascular: Negative for chest pain, palpitations and leg swelling.  Gastrointestinal: Negative for abdominal pain.  Genitourinary: Negative for dysuria.       Objective:   Physical Exam  Constitutional: Vital signs are normal. She appears well-developed and well-nourished. She is cooperative.  Non-toxic appearance. She does not appear ill. No distress.  HENT:  Head: Normocephalic.  Right Ear: Hearing, tympanic membrane, external ear and ear canal normal. Tympanic membrane is not erythematous, not retracted and not bulging.  Left Ear: Hearing, tympanic membrane, external ear and ear canal normal. Tympanic membrane is not erythematous, not retracted and not bulging.  Nose: No mucosal edema or rhinorrhea. Right sinus exhibits no maxillary sinus tenderness and no frontal sinus tenderness. Left sinus exhibits no maxillary sinus tenderness and no frontal sinus tenderness.  Mouth/Throat: Uvula is midline, oropharynx is clear and moist and mucous membranes are normal.  Eyes: Conjunctivae, EOM and lids are normal. Pupils are equal,  round, and reactive to light. No foreign bodies found.  Neck: Trachea normal and normal range of motion. Neck supple. Carotid bruit is not present. No mass and no thyromegaly present.  Cardiovascular: Normal rate, regular rhythm, S1 normal, S2 normal, normal heart sounds, intact distal pulses and normal pulses.  Exam reveals no gallop and no friction rub.   No murmur heard. Pulmonary/Chest: Effort normal and breath sounds normal. Not tachypneic. No respiratory distress. She has no decreased breath sounds. She has no wheezes. She has no rhonchi. She has no rales.  Abdominal: Normal appearance.  Neurological: She is alert.  Skin: Skin is warm, dry and intact.  Psychiatric: She has a normal mood and affect. Her speech is normal and behavior is normal. Judgment and thought content normal. Her mood appears not anxious. Cognition and memory are normal. She does not exhibit a depressed mood.          Assessment & Plan:

## 2010-09-29 NOTE — Patient Instructions (Signed)
Fish oil 2000 mg divided daily, lemon or orange flavored. Red Yeast Rice 2400 mg divided daily. Return for 3 month recheck of fasting labs to recheck cholesterol.

## 2010-09-29 NOTE — Assessment & Plan Note (Signed)
.  She is already  Doing very well with diet  And exercise. Seroquel likely causing elevation. Goal LDL <130.  Start fish oil and red yeast rice. Recheck in 3 months.  Info on low chol diet given.

## 2010-11-06 ENCOUNTER — Telehealth: Payer: Self-pay | Admitting: *Deleted

## 2010-11-06 NOTE — Telephone Encounter (Signed)
Pt has been taking red yeast rice and fish oil to help lower cholesterol and she says this isn't working.  She would like to start on a cholesterol medicine.  [facility] [place]  Dr. Imogene Burn has faxed a copy of her lab results, this is on your desk.

## 2010-11-10 MED ORDER — PRAVASTATIN SODIUM 20 MG PO TABS: 20.0000 mg | ORAL_TABLET | Freq: Every day | ORAL | Status: DC

## 2010-11-10 NOTE — Telephone Encounter (Signed)
LDl is significantly better than last check with red yeast rice. GOAl LDL is < 130 though, have her stop red yeast rice and we will change to low dose pravastain. Recechk in 3 ponths, here or at Dr. Layla Maw.  Please enter labs in EMR.

## 2010-11-10 NOTE — Telephone Encounter (Signed)
Can you enter in EMR.. Her recent lipids.. so I can address this before my return Thurs.

## 2010-11-11 NOTE — Telephone Encounter (Signed)
Patient advised and will have labs done in 3 months

## 2010-12-28 ENCOUNTER — Other Ambulatory Visit: Payer: BC Managed Care – PPO

## 2010-12-29 ENCOUNTER — Other Ambulatory Visit (INDEPENDENT_AMBULATORY_CARE_PROVIDER_SITE_OTHER): Payer: BC Managed Care – PPO

## 2010-12-29 DIAGNOSIS — E7800 Pure hypercholesterolemia, unspecified: Secondary | ICD-10-CM

## 2010-12-29 LAB — COMPREHENSIVE METABOLIC PANEL
CO2: 31 mEq/L (ref 19–32)
Creatinine, Ser: 0.9 mg/dL (ref 0.4–1.2)
GFR: 65.47 mL/min (ref >60.00)
Glucose, Bld: 94 mg/dL (ref 70–99)
Total Bilirubin: 0.4 mg/dL (ref 0.3–1.2)

## 2010-12-29 LAB — LIPID PANEL
Cholesterol: 166 mg/dL (ref 0–200)
HDL: 48.6 mg/dL (ref >39.00)
Triglycerides: 87 mg/dL (ref 0.0–149.0)
VLDL: 17.4 mg/dL (ref 0.0–40.0)

## 2011-01-19 ENCOUNTER — Other Ambulatory Visit: Payer: Self-pay | Admitting: Family Medicine

## 2011-02-01 ENCOUNTER — Ambulatory Visit: Payer: Self-pay | Admitting: Unknown Physician Specialty

## 2011-10-04 LAB — HM PAP SMEAR: HM PAP: NORMAL

## 2011-10-11 ENCOUNTER — Other Ambulatory Visit: Payer: Self-pay | Admitting: *Deleted

## 2011-10-11 NOTE — Telephone Encounter (Signed)
Received faxed refill request from pharmacy. Last office visit was 09/29/10. Is it okay to refill medication?

## 2011-10-12 NOTE — Telephone Encounter (Signed)
Left message asking patient to call back

## 2011-10-12 NOTE — Telephone Encounter (Signed)
Left message for patient to return my call.

## 2011-10-12 NOTE — Telephone Encounter (Signed)
Needs appt, refill until then 

## 2011-10-18 ENCOUNTER — Encounter: Payer: Self-pay | Admitting: Family Medicine

## 2011-10-18 ENCOUNTER — Ambulatory Visit (INDEPENDENT_AMBULATORY_CARE_PROVIDER_SITE_OTHER): Payer: BC Managed Care – PPO | Admitting: Family Medicine

## 2011-10-18 VITALS — BP 118/74 | HR 90 | Temp 98.3°F | Ht 61.0 in | Wt 111.2 lb

## 2011-10-18 DIAGNOSIS — F329 Major depressive disorder, single episode, unspecified: Secondary | ICD-10-CM

## 2011-10-18 DIAGNOSIS — I10 Essential (primary) hypertension: Secondary | ICD-10-CM

## 2011-10-18 DIAGNOSIS — E7800 Pure hypercholesterolemia, unspecified: Secondary | ICD-10-CM

## 2011-10-18 LAB — BASIC METABOLIC PANEL
BUN: 15 mg/dL (ref 6–23)
Calcium: 9.3 mg/dL (ref 8.4–10.5)
Creatinine, Ser: 0.9 mg/dL (ref 0.4–1.2)
GFR: 70.45 mL/min (ref >60.00)

## 2011-10-18 LAB — CBC WITH DIFFERENTIAL/PLATELET
Basophils Absolute: 0 10*3/uL (ref 0.0–0.1)
Basophils Relative: 0.4 % (ref 0.0–3.0)
Eosinophils Absolute: 0.1 10*3/uL (ref 0.0–0.7)
Hemoglobin: 12.6 g/dL (ref 12.0–15.0)
MCHC: 33.3 g/dL (ref 30.0–36.0)
MCV: 93.7 fl (ref 78.0–100.0)
Monocytes Absolute: 0.3 10*3/uL (ref 0.1–1.0)
Neutro Abs: 2.9 10*3/uL (ref 1.4–7.7)
Neutrophils Relative %: 65.6 % (ref 43.0–77.0)
RBC: 4.03 Mil/uL (ref 3.87–5.11)
RDW: 12.7 % (ref 11.5–14.6)

## 2011-10-18 LAB — LIPID PANEL
Cholesterol: 197 mg/dL (ref 0–200)
HDL: 65.7 mg/dL (ref >39.00)
Triglycerides: 74 mg/dL (ref 0.0–149.0)
VLDL: 14.8 mg/dL (ref 0.0–40.0)

## 2011-10-18 LAB — HEPATIC FUNCTION PANEL: Total Bilirubin: 0.5 mg/dL (ref 0.3–1.2)

## 2011-10-18 LAB — TSH: TSH: 1.1 u[IU]/mL (ref 0.35–5.50)

## 2011-10-18 NOTE — Assessment & Plan Note (Signed)
New to provider.  Pt w/ extensive psych hx but is under the ongoing care of psychiatrist.  He is managing and prescribing her meds.  Discussed crisis plan in case of emergency- pt to go to [facility] ER.  Pt expressed understanding and is in agreement w/ plan.

## 2011-10-18 NOTE — Assessment & Plan Note (Signed)
New to provider.  Chronic for pt.  Well controlled.  Asymptomatic.  Refill provided.

## 2011-10-18 NOTE — Progress Notes (Signed)
  Subjective:    Patient ID: Theresa Martin, female    DOB: 1956/03/19, 56 y.o.   MRN: [id number]  HPI Transfer from [facility].  GYN- [facility] [place] [facility].  UTD on pap and mammo.  GIJarold Motto, UTD on colonoscopy.  HTN- chronic problem, on Maxzide 25.  + family hx.  No CP, SOB, HAs, visual changes, edema.  Hyperlipidemia- noted last summer, felt to be med related.  Started on Pravachol.  No abd pain, N/V, myalgias.  Bipolar Disorder- seeing Dr Claudie Fisherman in [place], he is prescribing all psych meds (Klonopin, Lexapro, neurontin, Lamictal, Seroquel, remeron).  Feels sxs are better controlled on current med regimen.  Had 2 'break downs' requiring hospitalization in the last 2 years.  Previously went to [facility] behavioral unit but would be switching to [facility].   Review of Systems For ROS see HPI     Objective:   Physical Exam  Vitals reviewed. Constitutional: She is oriented to person, place, and time. She appears well-developed and well-nourished. No distress.  HENT:  Head: Normocephalic and atraumatic.  Eyes: Conjunctivae and EOM are normal. Pupils are equal, round, and reactive to light.  Neck: Normal range of motion. Neck supple. No thyromegaly present.  Cardiovascular: Normal rate, regular rhythm, normal heart sounds and intact distal pulses.   No murmur heard. Pulmonary/Chest: Effort normal and breath sounds normal. No respiratory distress.  Abdominal: Soft. She exhibits no distension. There is no tenderness.  Musculoskeletal: She exhibits no edema.  Lymphadenopathy:    She has no cervical adenopathy.  Neurological: She is alert and oriented to person, place, and time.  Skin: Skin is warm and dry.  Psychiatric: She has a normal mood and affect. Her behavior is normal.          Assessment & Plan:

## 2011-10-18 NOTE — Assessment & Plan Note (Signed)
New to provider.  Check labs as pt is overdue.  Tolerating statin w/out difficulty.  Adjust med prn.

## 2011-10-18 NOTE — Patient Instructions (Addendum)
Schedule your complete physical in 6 months We'll notify you of your lab results and make any changes if needed Keep up the good work!  You look great! Think of [place] as your home base! Call with any questions or concerns Welcome!  We're glad to have you!!!

## 2011-10-19 ENCOUNTER — Telehealth: Payer: Self-pay | Admitting: Internal Medicine

## 2011-10-19 ENCOUNTER — Other Ambulatory Visit: Payer: Self-pay | Admitting: Family Medicine

## 2011-10-19 MED ORDER — TRIAMTERENE-HCTZ 37.5-25 MG PO TABS: 1.0000 | ORAL_TABLET | Freq: Every day | ORAL | Status: DC

## 2011-10-19 NOTE — Telephone Encounter (Signed)
Called pt to advise recent lab results and advised that MD Beverely Low likes to read the labs prior to sending medication, pt understood all instructions and advised that she only needs refill on her maxide sent to [facility], sent via escribe

## 2011-10-23 ENCOUNTER — Other Ambulatory Visit: Payer: Self-pay | Admitting: Family Medicine

## 2011-11-01 ENCOUNTER — Other Ambulatory Visit: Payer: Self-pay | Admitting: Family Medicine

## 2011-12-15 NOTE — Telephone Encounter (Signed)
No comment

## 2012-01-07 ENCOUNTER — Telehealth: Payer: Self-pay | Admitting: Family Medicine

## 2012-01-07 NOTE — Telephone Encounter (Signed)
Discussed with pt

## 2012-01-07 NOTE — Telephone Encounter (Signed)
Pt filling out wellness forms for [employer] and needs vitals-pls call

## 2012-01-12 ENCOUNTER — Telehealth: Payer: Self-pay | Admitting: *Deleted

## 2012-01-12 NOTE — Telephone Encounter (Signed)
Pt called to discuss labs noted on 10-18-11, gave numbers requested, pt understood all information

## 2012-04-05 ENCOUNTER — Telehealth: Payer: Self-pay | Admitting: Family Medicine

## 2012-04-05 MED ORDER — TRIAMTERENE-HCTZ 37.5-25 MG PO TABS: 1.0000 | ORAL_TABLET | Freq: Every day | ORAL | Status: DC

## 2012-04-05 NOTE — Telephone Encounter (Signed)
Refill: Triamterene-hctz 37.5-25 mg tab. Take 1 each (1 tablet total) by mouth daily. Qty 30. Last fill 03-09-12

## 2012-06-01 DIAGNOSIS — F22 Delusional disorders: Secondary | ICD-10-CM | POA: Diagnosis not present

## 2012-06-01 DIAGNOSIS — F333 Major depressive disorder, recurrent, severe with psychotic symptoms: Secondary | ICD-10-CM | POA: Diagnosis not present

## 2012-06-01 DIAGNOSIS — F410 Panic disorder [episodic paroxysmal anxiety] without agoraphobia: Secondary | ICD-10-CM | POA: Diagnosis not present

## 2012-06-10 ENCOUNTER — Other Ambulatory Visit: Payer: Self-pay | Admitting: Family Medicine

## 2012-06-12 NOTE — Telephone Encounter (Signed)
Rx sent to the pharmacy by e-script.  Note pt needs to schedule an OV for CPE.//AB/CMA

## 2012-07-04 DIAGNOSIS — F333 Major depressive disorder, recurrent, severe with psychotic symptoms: Secondary | ICD-10-CM | POA: Diagnosis not present

## 2012-07-04 DIAGNOSIS — F22 Delusional disorders: Secondary | ICD-10-CM | POA: Diagnosis not present

## 2012-07-04 DIAGNOSIS — F410 Panic disorder [episodic paroxysmal anxiety] without agoraphobia: Secondary | ICD-10-CM | POA: Diagnosis not present

## 2012-07-13 ENCOUNTER — Telehealth: Payer: Self-pay | Admitting: Family Medicine

## 2012-07-13 NOTE — Telephone Encounter (Signed)
Refill: Triamterene-hctz 37.5-25mg  tab. Take 1 each (1 tablet total) by mouth daily. Pt needs to schedule an ov.

## 2012-07-15 ENCOUNTER — Other Ambulatory Visit: Payer: Self-pay | Admitting: Family Medicine

## 2012-07-17 NOTE — Telephone Encounter (Signed)
Med filled only until CPE next month.

## 2012-07-17 NOTE — Telephone Encounter (Signed)
Pt has a CPE scheduled for 5/1. Med refilled to last only until then.

## 2012-07-27 ENCOUNTER — Ambulatory Visit (INDEPENDENT_AMBULATORY_CARE_PROVIDER_SITE_OTHER): Payer: Medicare Other | Admitting: Family Medicine

## 2012-07-27 ENCOUNTER — Encounter: Payer: Self-pay | Admitting: Family Medicine

## 2012-07-27 ENCOUNTER — Encounter: Payer: Self-pay | Admitting: *Deleted

## 2012-07-27 VITALS — BP 130/80 | HR 73 | Temp 97.8°F | Ht 61.5 in | Wt 115.8 lb

## 2012-07-27 DIAGNOSIS — Z0000 Encounter for general adult medical examination without abnormal findings: Secondary | ICD-10-CM

## 2012-07-27 DIAGNOSIS — Z23 Encounter for immunization: Secondary | ICD-10-CM | POA: Diagnosis not present

## 2012-07-27 LAB — CBC WITH DIFFERENTIAL/PLATELET
Basophils Relative: 0.5 % (ref 0.0–3.0)
Eosinophils Absolute: 0 10*3/uL (ref 0.0–0.7)
Hemoglobin: 12.4 g/dL (ref 12.0–15.0)
Lymphocytes Relative: 29.2 % (ref 12.0–46.0)
Monocytes Relative: 10.1 % (ref 3.0–12.0)
Neutro Abs: 2.2 10*3/uL (ref 1.4–7.7)
Neutrophils Relative %: 59.9 % (ref 43.0–77.0)
RBC: 3.85 Mil/uL — ABNORMAL LOW (ref 3.87–5.11)
WBC: 3.7 10*3/uL — ABNORMAL LOW (ref 4.5–10.5)

## 2012-07-27 LAB — HEPATIC FUNCTION PANEL
ALT: 32 U/L (ref 0–35)
AST: 38 U/L — ABNORMAL HIGH (ref 0–37)
Bilirubin, Direct: 0 mg/dL (ref 0.0–0.3)
Total Bilirubin: 0.4 mg/dL (ref 0.3–1.2)
Total Protein: 6.8 g/dL (ref 6.0–8.3)

## 2012-07-27 LAB — BASIC METABOLIC PANEL
BUN: 11 mg/dL (ref 6–23)
CO2: 31 mEq/L (ref 19–32)
Chloride: 102 mEq/L (ref 96–112)
Creatinine, Ser: 0.8 mg/dL (ref 0.4–1.2)
Potassium: 3.5 mEq/L (ref 3.5–5.1)

## 2012-07-27 LAB — LIPID PANEL
LDL Cholesterol: 91 mg/dL (ref 0–99)
Total CHOL/HDL Ratio: 3
Triglycerides: 67 mg/dL (ref 0.0–149.0)

## 2012-07-27 NOTE — Progress Notes (Signed)
  Subjective:    Patient ID: Theresa Martin, female    DOB: 1955-11-12, 57 y.o.   MRN: [id number]  HPI CPE- UTD on GYN [facility] [place]), colonoscopy.  No concerns today.   Review of Systems Patient reports no vision/ hearing changes, adenopathy,fever, weight change,  persistant/recurrent hoarseness , swallowing issues, chest pain, palpitations, edema, persistant/recurrent cough, hemoptysis, dyspnea (rest/exertional/paroxysmal nocturnal), gastrointestinal bleeding (melena, rectal bleeding), abdominal pain, significant heartburn, bowel changes, GU symptoms (dysuria, hematuria, incontinence), Gyn symptoms (abnormal  bleeding, pain),  syncope, focal weakness, memory loss, numbness & tingling, skin/hair/nail changes, abnormal bruising or bleeding, anxiety, or depression.     Objective:   Physical Exam General Appearance:    Alert, cooperative, no distress, appears stated age  Head:    Normocephalic, without obvious abnormality, atraumatic  Eyes:    PERRL, conjunctiva/corneas clear, EOM's intact, fundi    benign, both eyes  Ears:    Normal TM's and external ear canals, both ears  Nose:   Nares normal, septum midline, mucosa normal, no drainage    or sinus tenderness  Throat:   Lips, mucosa, and tongue normal; teeth and gums normal  Neck:   Supple, symmetrical, trachea midline, no adenopathy;    Thyroid: no enlargement/tenderness/nodules  Back:     Symmetric, no curvature, ROM normal, no CVA tenderness  Lungs:     Clear to auscultation bilaterally, respirations unlabored  Chest Wall:    No tenderness or deformity   Heart:    Regular rate and rhythm, S1 and S2 normal, no murmur, rub   or gallop  Breast Exam:    Deferred to GYN  Abdomen:     Soft, non-tender, bowel sounds active all four quadrants,    no masses, no organomegaly  Genitalia:    Deferred to GYN  Rectal:    Extremities:   Extremities normal, atraumatic, no cyanosis or edema  Pulses:   2+ and symmetric all  extremities  Skin:   Skin color, texture, turgor normal, no rashes or lesions  Lymph nodes:   Cervical, supraclavicular, and axillary nodes normal  Neurologic:   CNII-XII intact, normal strength, sensation and reflexes    throughout          Assessment & Plan:

## 2012-07-27 NOTE — Patient Instructions (Addendum)
Follow up in 6 months to recheck cholesterol and BP We'll notify you of your lab results and make any changes if needed Keep up the good work!  You look great! Call with any questions or concerns Happy Spring!

## 2012-07-27 NOTE — Assessment & Plan Note (Signed)
Pt's PE WNL.  UTD on GYN and colonoscopy.  Check labs.  Anticipatory guidance provided.  

## 2012-07-31 LAB — VITAMIN D 1,25 DIHYDROXY: Vitamin D2 1, 25 (OH)2: <8 pg/mL

## 2012-08-02 ENCOUNTER — Telehealth: Payer: Self-pay | Admitting: Family Medicine

## 2012-08-02 DIAGNOSIS — F22 Delusional disorders: Secondary | ICD-10-CM | POA: Diagnosis not present

## 2012-08-02 DIAGNOSIS — F333 Major depressive disorder, recurrent, severe with psychotic symptoms: Secondary | ICD-10-CM | POA: Diagnosis not present

## 2012-08-02 DIAGNOSIS — F410 Panic disorder [episodic paroxysmal anxiety] without agoraphobia: Secondary | ICD-10-CM | POA: Diagnosis not present

## 2012-08-02 NOTE — Telephone Encounter (Signed)
Pt called to see if dr Beverely Low nurse could send her a copy of her last physical to her house. thanks

## 2012-08-02 NOTE — Telephone Encounter (Signed)
OV notes mailed.

## 2012-08-15 ENCOUNTER — Other Ambulatory Visit: Payer: Self-pay | Admitting: Family Medicine

## 2012-08-17 NOTE — Telephone Encounter (Signed)
Rx sent to the pharmacy by e-script.//AB/CMA 

## 2012-08-30 DIAGNOSIS — F22 Delusional disorders: Secondary | ICD-10-CM | POA: Diagnosis not present

## 2012-08-30 DIAGNOSIS — F410 Panic disorder [episodic paroxysmal anxiety] without agoraphobia: Secondary | ICD-10-CM | POA: Diagnosis not present

## 2012-08-30 DIAGNOSIS — F333 Major depressive disorder, recurrent, severe with psychotic symptoms: Secondary | ICD-10-CM | POA: Diagnosis not present

## 2012-10-03 LAB — HM MAMMOGRAPHY: HM Mammogram: NORMAL

## 2012-10-31 ENCOUNTER — Other Ambulatory Visit: Payer: Self-pay | Admitting: Family Medicine

## 2012-11-01 ENCOUNTER — Telehealth: Payer: Self-pay | Admitting: *Deleted

## 2012-11-01 DIAGNOSIS — F410 Panic disorder [episodic paroxysmal anxiety] without agoraphobia: Secondary | ICD-10-CM | POA: Diagnosis not present

## 2012-11-01 DIAGNOSIS — F333 Major depressive disorder, recurrent, severe with psychotic symptoms: Secondary | ICD-10-CM | POA: Diagnosis not present

## 2012-11-01 NOTE — Telephone Encounter (Signed)
Pharmacy sent multiple faxes requesting refills for pravastatin 20 mg.  I called and explained to pharmacist that the prescription was e-scribed 10/31/12. However [facility] issues so I sent a verbal over the phone.  AG cma

## 2012-11-02 NOTE — Telephone Encounter (Signed)
Rx sent to the pharmacy by e-script.//AB/CMA 

## 2012-12-07 DIAGNOSIS — B079 Viral wart, unspecified: Secondary | ICD-10-CM | POA: Diagnosis not present

## 2012-12-07 DIAGNOSIS — B359 Dermatophytosis, unspecified: Secondary | ICD-10-CM | POA: Diagnosis not present

## 2012-12-07 DIAGNOSIS — L0889 Other specified local infections of the skin and subcutaneous tissue: Secondary | ICD-10-CM | POA: Diagnosis not present

## 2012-12-22 DIAGNOSIS — B359 Dermatophytosis, unspecified: Secondary | ICD-10-CM | POA: Diagnosis not present

## 2012-12-22 DIAGNOSIS — L0889 Other specified local infections of the skin and subcutaneous tissue: Secondary | ICD-10-CM | POA: Diagnosis not present

## 2012-12-22 DIAGNOSIS — B079 Viral wart, unspecified: Secondary | ICD-10-CM | POA: Diagnosis not present

## 2012-12-22 DIAGNOSIS — L820 Inflamed seborrheic keratosis: Secondary | ICD-10-CM | POA: Diagnosis not present

## 2013-01-02 DIAGNOSIS — F39 Unspecified mood [affective] disorder: Secondary | ICD-10-CM | POA: Diagnosis not present

## 2013-01-02 DIAGNOSIS — F410 Panic disorder [episodic paroxysmal anxiety] without agoraphobia: Secondary | ICD-10-CM | POA: Diagnosis not present

## 2013-01-25 DIAGNOSIS — B079 Viral wart, unspecified: Secondary | ICD-10-CM | POA: Diagnosis not present

## 2013-01-25 DIAGNOSIS — L0889 Other specified local infections of the skin and subcutaneous tissue: Secondary | ICD-10-CM | POA: Diagnosis not present

## 2013-01-25 DIAGNOSIS — L820 Inflamed seborrheic keratosis: Secondary | ICD-10-CM | POA: Diagnosis not present

## 2013-01-26 ENCOUNTER — Telehealth: Payer: Self-pay | Admitting: Family Medicine

## 2013-01-26 NOTE — Telephone Encounter (Signed)
Spoke with pt who had questions concerning appt next week with Dr. Beverely Low. Advised that per AVS, she was to follow up in 6 months for check of cholesterol and B/P. Appt made for 1:45 on 01/31/13.

## 2013-01-26 NOTE — Telephone Encounter (Signed)
Patient called and did not know why she was scheduled for a phys on 01/30/2013. When she had one on 07/28/2012. Thanks

## 2013-01-30 ENCOUNTER — Encounter: Payer: BC Managed Care – PPO | Admitting: Family Medicine

## 2013-01-31 ENCOUNTER — Ambulatory Visit: Payer: BC Managed Care – PPO | Admitting: Family Medicine

## 2013-02-01 ENCOUNTER — Other Ambulatory Visit: Payer: Self-pay

## 2013-02-06 ENCOUNTER — Telehealth: Payer: Self-pay | Admitting: Family Medicine

## 2013-02-13 ENCOUNTER — Encounter: Payer: Self-pay | Admitting: Family Medicine

## 2013-02-13 ENCOUNTER — Ambulatory Visit: Payer: BC Managed Care – PPO | Admitting: Family Medicine

## 2013-02-13 ENCOUNTER — Ambulatory Visit (INDEPENDENT_AMBULATORY_CARE_PROVIDER_SITE_OTHER): Payer: Medicare Other | Admitting: Family Medicine

## 2013-02-13 VITALS — BP 124/78 | HR 71 | Temp 98.2°F | Resp 16 | Wt 118.5 lb

## 2013-02-13 DIAGNOSIS — E785 Hyperlipidemia, unspecified: Secondary | ICD-10-CM

## 2013-02-13 DIAGNOSIS — I10 Essential (primary) hypertension: Secondary | ICD-10-CM

## 2013-02-13 LAB — BASIC METABOLIC PANEL
CO2: 33 mEq/L — ABNORMAL HIGH (ref 19–32)
Calcium: 9.5 mg/dL (ref 8.4–10.5)
Creatinine, Ser: 0.8 mg/dL (ref 0.4–1.2)
Glucose, Bld: 80 mg/dL (ref 70–99)

## 2013-02-13 LAB — LIPID PANEL
HDL: 60 mg/dL (ref >39.00)
Triglycerides: 67 mg/dL (ref 0.0–149.0)

## 2013-02-13 LAB — HEPATIC FUNCTION PANEL
Albumin: 4.5 g/dL (ref 3.5–5.2)
Total Protein: 7.3 g/dL (ref 6.0–8.3)

## 2013-02-13 NOTE — Assessment & Plan Note (Signed)
Chronic problem.  Asymptomatic.  Check labs.  No anticipated med changes. 

## 2013-02-13 NOTE — Patient Instructions (Signed)
Schedule your complete physical in 6 months We'll notify you of your lab results and make any changes if needed Keep up the good work!  You look great!!! Call with any questions or concerns Happy Holidays!! 

## 2013-02-13 NOTE — Assessment & Plan Note (Signed)
Chronic problem.  Tolerating statin.  Check labs.  Adjust meds prn  

## 2013-02-13 NOTE — Progress Notes (Signed)
  Subjective:    Patient ID: Theresa Martin, female    DOB: 1956-01-13, 57 y.o.   MRN: [id number]  HPI Pre visit review using our clinic review tool, if applicable. No additional management support is needed unless otherwise documented below in the visit note.   HTN- chronic problem, on Maxzide.  Denies CP, SOB, HAs, visual changes, edema.  Hyperlipidemia- chronic problem, on Pravastatin.  No abd pain, N/V, myalgias.   Review of Systems For ROS see HPI     Objective:   Physical Exam  Vitals reviewed. Constitutional: She is oriented to person, place, and time. She appears well-developed and well-nourished. No distress.  HENT:  Head: Normocephalic and atraumatic.  Eyes: Conjunctivae and EOM are normal. Pupils are equal, round, and reactive to light.  Neck: Normal range of motion. Neck supple. No thyromegaly present.  Cardiovascular: Normal rate, regular rhythm, normal heart sounds and intact distal pulses.   No murmur heard. Pulmonary/Chest: Effort normal and breath sounds normal. No respiratory distress.  Abdominal: Soft. She exhibits no distension. There is no tenderness.  Musculoskeletal: She exhibits no edema.  Lymphadenopathy:    She has no cervical adenopathy.  Neurological: She is alert and oriented to person, place, and time.  Skin: Skin is warm and dry.  Psychiatric: She has a normal mood and affect. Her behavior is normal.          Assessment & Plan:

## 2013-02-14 ENCOUNTER — Telehealth: Payer: Self-pay | Admitting: Family Medicine

## 2013-02-14 DIAGNOSIS — F410 Panic disorder [episodic paroxysmal anxiety] without agoraphobia: Secondary | ICD-10-CM | POA: Diagnosis not present

## 2013-02-14 DIAGNOSIS — F333 Major depressive disorder, recurrent, severe with psychotic symptoms: Secondary | ICD-10-CM | POA: Diagnosis not present

## 2013-02-14 NOTE — Telephone Encounter (Signed)
Patient waned to know if we could fax her recent lab work to Dr Imogene Burn @ fax# [phone] when her labs come back.

## 2013-02-14 NOTE — Telephone Encounter (Signed)
Results have been sent to pt in mychart. Also faxed today.

## 2013-02-15 ENCOUNTER — Other Ambulatory Visit: Payer: Self-pay | Admitting: Family Medicine

## 2013-02-15 NOTE — Telephone Encounter (Signed)
Med filled.  

## 2013-03-12 NOTE — Telephone Encounter (Signed)
error 

## 2013-04-04 DIAGNOSIS — F410 Panic disorder [episodic paroxysmal anxiety] without agoraphobia: Secondary | ICD-10-CM | POA: Diagnosis not present

## 2013-04-04 DIAGNOSIS — F333 Major depressive disorder, recurrent, severe with psychotic symptoms: Secondary | ICD-10-CM | POA: Diagnosis not present

## 2013-05-07 ENCOUNTER — Encounter: Payer: Self-pay | Admitting: Family

## 2013-05-07 ENCOUNTER — Encounter (HOSPITAL_BASED_OUTPATIENT_CLINIC_OR_DEPARTMENT_OTHER): Payer: Self-pay | Admitting: Emergency Medicine

## 2013-05-07 ENCOUNTER — Ambulatory Visit (INDEPENDENT_AMBULATORY_CARE_PROVIDER_SITE_OTHER): Payer: Medicare Other | Admitting: Family

## 2013-05-07 ENCOUNTER — Emergency Department (HOSPITAL_BASED_OUTPATIENT_CLINIC_OR_DEPARTMENT_OTHER)
Admission: EM | Admit: 2013-05-07 | Discharge: 2013-05-07 | Disposition: A | Discharge status: Home / Self Care | Payer: Medicare Other | Attending: Emergency Medicine | Admitting: Emergency Medicine

## 2013-05-07 VITALS — BP 90/52 | HR 75 | Temp 98.1°F | Resp 16 | Wt 111.0 lb

## 2013-05-07 DIAGNOSIS — R509 Fever, unspecified: Secondary | ICD-10-CM | POA: Insufficient documentation

## 2013-05-07 DIAGNOSIS — Z87442 Personal history of urinary calculi: Secondary | ICD-10-CM | POA: Insufficient documentation

## 2013-05-07 DIAGNOSIS — Z8701 Personal history of pneumonia (recurrent): Secondary | ICD-10-CM | POA: Insufficient documentation

## 2013-05-07 DIAGNOSIS — Z79899 Other long term (current) drug therapy: Secondary | ICD-10-CM | POA: Insufficient documentation

## 2013-05-07 DIAGNOSIS — K529 Noninfective gastroenteritis and colitis, unspecified: Secondary | ICD-10-CM

## 2013-05-07 DIAGNOSIS — I10 Essential (primary) hypertension: Secondary | ICD-10-CM | POA: Insufficient documentation

## 2013-05-07 DIAGNOSIS — K5289 Other specified noninfective gastroenteritis and colitis: Secondary | ICD-10-CM

## 2013-05-07 DIAGNOSIS — E785 Hyperlipidemia, unspecified: Secondary | ICD-10-CM | POA: Insufficient documentation

## 2013-05-07 DIAGNOSIS — F329 Major depressive disorder, single episode, unspecified: Secondary | ICD-10-CM | POA: Insufficient documentation

## 2013-05-07 DIAGNOSIS — H6120 Impacted cerumen, unspecified ear: Secondary | ICD-10-CM | POA: Diagnosis not present

## 2013-05-07 DIAGNOSIS — R52 Pain, unspecified: Secondary | ICD-10-CM | POA: Diagnosis not present

## 2013-05-07 DIAGNOSIS — F411 Generalized anxiety disorder: Secondary | ICD-10-CM | POA: Insufficient documentation

## 2013-05-07 DIAGNOSIS — F3289 Other specified depressive episodes: Secondary | ICD-10-CM | POA: Insufficient documentation

## 2013-05-07 LAB — URINALYSIS, ROUTINE W REFLEX MICROSCOPIC
BILIRUBIN URINE: NEGATIVE
Glucose, UA: NEGATIVE mg/dL
Hgb urine dipstick: NEGATIVE
KETONES UR: NEGATIVE mg/dL
Leukocytes, UA: NEGATIVE
Nitrite: NEGATIVE
PH: 7.5 (ref 5.0–8.0)
Protein, ur: NEGATIVE mg/dL
Specific Gravity, Urine: 1.02 (ref 1.005–1.030)
UROBILINOGEN UA: 0.2 mg/dL (ref 0.0–1.0)

## 2013-05-07 LAB — BASIC METABOLIC PANEL
BUN: 19 mg/dL (ref 6–23)
CHLORIDE: 99 meq/L (ref 96–112)
CO2: 29 mEq/L (ref 19–32)
Calcium: 8.5 mg/dL (ref 8.4–10.5)
Creatinine, Ser: 0.8 mg/dL (ref 0.50–1.10)
GFR calc Af Amer: >90 mL/min (ref >90)
GFR calc non Af Amer: 80 mL/min — ABNORMAL LOW (ref >90)
GLUCOSE: 97 mg/dL (ref 70–99)
Potassium: 3.1 mEq/L — ABNORMAL LOW (ref 3.7–5.3)
Sodium: 141 mEq/L (ref 137–147)

## 2013-05-07 MED ORDER — ONDANSETRON HCL 4 MG/2ML IJ SOLN
4.0000 mg | Freq: Once | INTRAMUSCULAR | Status: AC
  Administered 2013-05-07: 4 mg via INTRAVENOUS
  Filled 2013-05-07: qty 2

## 2013-05-07 MED ORDER — ONDANSETRON 4 MG PO TBDP: 4.0000 mg | ORAL_TABLET | Freq: Three times a day (TID) | ORAL | Status: DC | PRN

## 2013-05-07 MED ORDER — SODIUM CHLORIDE 0.9 % IV BOLUS (SEPSIS)
1000.0000 mL | Freq: Once | INTRAVENOUS | Status: AC
  Administered 2013-05-07: 1000 mL via INTRAVENOUS

## 2013-05-07 MED ORDER — DIPHENOXYLATE-ATROPINE 2.5-0.025 MG PO TABS: 1.0000 | ORAL_TABLET | Freq: Four times a day (QID) | ORAL | Status: DC | PRN

## 2013-05-07 MED ORDER — POTASSIUM CHLORIDE ER 10 MEQ PO TBCR: 20.0000 meq | EXTENDED_RELEASE_TABLET | Freq: Two times a day (BID) | ORAL | Status: DC

## 2013-05-07 MED ORDER — PANTOPRAZOLE SODIUM 40 MG IV SOLR
40.0000 mg | Freq: Once | INTRAVENOUS | Status: AC
  Administered 2013-05-07: 40 mg via INTRAVENOUS
  Filled 2013-05-07: qty 40

## 2013-05-07 NOTE — Discharge Instructions (Signed)
Viral Gastroenteritis Viral gastroenteritis is also called stomach flu. This illness is caused by a certain type of germ (virus). It can cause sudden watery poop (diarrhea) and throwing up (vomiting). This can cause you to lose body fluids (dehydration). This illness usually lasts for 3 to 8 days. It usually goes away on its own. HOME CARE   Drink enough fluids to keep your pee (urine) clear or pale yellow. Drink small amounts of fluids often.  Ask your doctor how to replace body fluid losses (rehydration).  Avoid:  Foods high in sugar.  Alcohol.  Bubbly (carbonated) drinks.  Tobacco.  Juice.  Caffeine drinks.  Very hot or cold fluids.  Fatty, greasy foods.  Eating too much at one time.  Dairy products until 24 to 48 hours after your watery poop stops.  You may eat foods with active cultures (probiotics). They can be found in some yogurts and supplements.  Wash your hands well to avoid spreading the illness.  Only take medicines as told by your doctor. Do not give aspirin to children. Do not take medicines for watery poop (antidiarrheals).  Ask your doctor if you should keep taking your regular medicines.  Keep all doctor visits as told. GET HELP RIGHT AWAY IF:   You cannot keep fluids down.  You do not pee at least once every 6 to 8 hours.  You are short of breath.  You see blood in your poop or throw up. This may look like coffee grounds.  You have belly (abdominal) pain that gets worse or is just in one small spot (localized).  You keep throwing up or having watery poop.  You have a fever.  The patient is a child younger than 3 months, and he or she has a fever.  The patient is a child older than 3 months, and he or she has a fever and problems that do not go away.  The patient is a child older than 3 months, and he or she has a fever and problems that suddenly get worse.  The patient is a baby, and he or she has no tears when crying. MAKE SURE YOU:     Understand these instructions.  Will watch your condition.  Will get help right away if you are not doing well or get worse. Document Released: 09/01/2007 Document Revised: 06/07/2011 Document Reviewed: 12/30/2010 [facility] Patient Information 2014 [place].

## 2013-05-07 NOTE — ED Notes (Signed)
MD at bedside. 

## 2013-05-07 NOTE — ED Notes (Signed)
Pt c/o vomiting since yesterday and feels "dehydrated". Pt sts she was seen by Lenna Sciara NP upstairs and sent her for evaluation of "dehydration". Pt reports aching to back at present. Last episode of emesis at 11am.

## 2013-05-07 NOTE — Progress Notes (Signed)
Pre visit review using our clinic review tool, if applicable. No additional management support is needed unless otherwise documented below in the visit note. 

## 2013-05-07 NOTE — ED Notes (Addendum)
Pt up walking around ED without dizziness.  Walks with brisk gait. Ginger Ale stayed down with no return of nausea.

## 2013-05-07 NOTE — Assessment & Plan Note (Signed)
Cerumen removed with curette and irrigation revealing normal TM's bilaterally.

## 2013-05-07 NOTE — ED Provider Notes (Addendum)
CSN: [id number]     Arrival date & time 05/07/13  1436 History  This chart was scribed for Tanna Furry, MD by Celesta Gentile, ED Scribe. The patient was seen in room MH10/MH10. Patient's care was started at 3:17 PM.  Chief Complaint  Patient presents with  . Emesis   The history is provided by the patient. No language interpreter was used.   HPI Comments: Theresa Martin is a 58 y.o. female who presents to the Emergency Department complaining of persistent emesis with associated generalized myalgias that started yesterday.  Pt denies any visible blood in her emesis.  Pt states that she has fever and chills.  ED temperature is 98.34F.  Pt states that she feel's dehydrated.  Pt denies dysuria and diarrhea, but states that she is having more frequent bowel movements.  Pt states that she was seen by a NP upstairs and she sent her here for evaluation. Pt states that her last emesis episode was around 11:00 AM.  She reports her daughter has similar symptoms.  Pt states that she had two caesarean sections.  Pt states that she takes depression and anxiety medications daily.   Past Medical History  Diagnosis Date  . Anxiety   . Depression   . Hypertension   . Allergy   . Persistent headaches   . Kidney stones   . Pneumonia   . Hyperlipidemia    Past Surgical History  Procedure Laterality Date  . Cesarean section  1987, 1989  . Exploratory laparotomy      laproscopy for infertility  . Cardiovascular stress test  02/2009    treadmill stress test: Low risk   Family History  Problem Relation Age of Onset  . Hypertension Mother   . Coronary artery disease Mother   . Heart failure Mother   . Hypertension Father   . Liver disease Sister     liver failure  . Arrhythmia Brother   . Hypertension Brother   . Cancer Neg Hx     colon    History  Substance Use Topics  . Smoking status: Never Smoker   . Smokeless tobacco: Not on file  . Alcohol Use: No     Comment: rare   OB History   Grav  Para Term Preterm Abortions TAB SAB Ect Mult Living                 Review of Systems  Constitutional: Positive for fever and chills. Negative for diaphoresis, appetite change and fatigue.  HENT: Negative for mouth sores, sore throat and trouble swallowing.   Eyes: Negative for visual disturbance.  Respiratory: Negative for cough, chest tightness, shortness of breath and wheezing.   Cardiovascular: Negative for chest pain.  Gastrointestinal: Positive for vomiting. Negative for nausea, abdominal pain, diarrhea and abdominal distention.  Endocrine: Negative for polydipsia, polyphagia and polyuria.  Genitourinary: Negative for dysuria, frequency and hematuria.  Musculoskeletal: Positive for myalgias. Negative for gait problem.  Skin: Negative for color change, pallor and rash.  Neurological: Negative for dizziness, syncope, light-headedness and headaches.  Hematological: Does not bruise/bleed easily.  Psychiatric/Behavioral: Negative for behavioral problems and confusion.      Allergies  Review of patient's allergies indicates no known allergies.  Home Medications   Current Outpatient Rx  Name  Route  Sig  Dispense  Refill  . Calcium-Phosphorus-Vitamin D (CALCIUM GUMMIES) 250-100-500 MG-MG-UNIT CHEW   Oral   Chew 800 mg by mouth daily. Taking 4 gummies daily         .  clonazePAM (KLONOPIN) 0.5 MG tablet   Oral   Take 0.25 mg by mouth at bedtime.          . diphenoxylate-atropine (LOMOTIL) 2.5-0.025 MG per tablet   Oral   Take 1 tablet by mouth 4 (four) times daily as needed for diarrhea or loose stools.   30 tablet   0   . diphenoxylate-atropine (LOMOTIL) 2.5-0.025 MG per tablet   Oral   Take 1 tablet by mouth 4 (four) times daily as needed for diarrhea or loose stools.   30 tablet   0   . escitalopram (LEXAPRO) 10 MG tablet   Oral   Take 15 mg by mouth daily. TAKE ONE AND HALF TABS DAILY         . folic acid (FOLVITE) 1 MG tablet   Oral   Take 1 mg by mouth  daily.         Marland Kitchen gabapentin (NEURONTIN) 100 MG tablet   Oral   Take 100 mg by mouth. At bedtime and onset of headache          . Ginkgo Biloba (GINKOBA PO)   Oral   Take 1 tablet by mouth daily.         Marland Kitchen lamoTRIgine (LAMICTAL) 100 MG tablet   Oral   Take 100 mg by mouth daily. TAKE ONE TABLET QAM AND ONE TABLETS AT BEDTIME         . Melatonin 1 MG TABS   Oral   Take 1 tablet by mouth daily.         . ondansetron (ZOFRAN ODT) 4 MG disintegrating tablet   Oral   Take 1 tablet (4 mg total) by mouth every 8 (eight) hours as needed for nausea.   20 tablet   0   . ondansetron (ZOFRAN ODT) 4 MG disintegrating tablet   Oral   Take 1 tablet (4 mg total) by mouth every 8 (eight) hours as needed for nausea.   20 tablet   0   . potassium chloride (K-DUR) 10 MEQ tablet   Oral   Take 2 tablets (20 mEq total) by mouth 2 (two) times daily.   10 tablet   0   . pravastatin (PRAVACHOL) 20 MG tablet      TAKE 1 TABLET BY MOUTH DAILY.   30 tablet   5   . QUEtiapine (SEROQUEL XR) 300 MG 24 hr tablet   Oral   Take 300 mg by mouth 2 (two) times daily.           Marland Kitchen triamterene-hydrochlorothiazide (MAXZIDE-25) 37.5-25 MG per tablet      TAKE 1 TABLET BY MOUTH EVERY DAY   30 tablet   5    Triage Vitals: BP 116/53  Pulse 74  Temp(Src) 98.9 F (37.2 C) (Oral)  Resp 16  SpO2 100% Physical Exam  Nursing note and vitals reviewed. Constitutional: She appears well-developed and well-nourished. No distress.  HENT:  Head: Normocephalic and atraumatic.  Mouth/Throat: Mucous membranes are dry.  Eyes: Conjunctivae are normal. Right eye exhibits no discharge. Left eye exhibits no discharge.  Neck: Neck supple.  Cardiovascular: Normal rate, regular rhythm and normal heart sounds.  Exam reveals no gallop and no friction rub.   No murmur heard. Pulmonary/Chest: Effort normal and breath sounds normal. No respiratory distress. She has no wheezes. She has no rales.  Abdominal:  Soft. Bowel sounds are normal. She exhibits no distension. There is no tenderness.  Musculoskeletal: She exhibits no edema  and no tenderness.  Neurological: She is alert.  Skin: Skin is warm and dry. No rash noted.  Psychiatric: She has a normal mood and affect. Her behavior is normal. Judgment and thought content normal.    ED Course  Procedures (including critical care time) DIAGNOSTIC STUDIES: Oxygen Saturation is 100% on RA, normal by my interpretation.    COORDINATION OF CARE: 3:25 PM-Will order IV fluids.  Patient informed of current plan of treatment and evaluation and agrees with plan.    Labs Review Labs Reviewed  BASIC METABOLIC PANEL - Abnormal; Notable for the following:    Potassium 3.1 (*)    GFR calc non Af Amer 80 (*)    All other components within normal limits  URINALYSIS, ROUTINE W REFLEX MICROSCOPIC    MDM   Final diagnoses:  Gastroenteritis   I personally performed the services described in this documentation, which was scribed in my presence. The recorded information has been reviewed and is accurate.   Patient was taking some by mouth liquids. Asymptomatic. And Lipitor without orthostasis. Discussed with her about holding her Dyazide. Oral rehydration. Prescription given for Lomotil she does develops and diarrhea her daughter has. Zofran for nausea. Hold the Dyazide until she taking a by mouth diet. She's taking a regular by mouth diet with solids by tomorrow I don't see that any additional intervention would be required. If she is not have given her a prescription for potassium. I did explain to her that her potassium slightly low 3.1. She feels weak dizzy palpitation muscle cramps or other new or worsening symptoms then I would like her to recheck here.    Tanna Furry, MD 05/07/13 [place], MD 05/07/13 1745

## 2013-05-07 NOTE — Assessment & Plan Note (Addendum)
Suspect acute gastroenteritis due to vomiting and nausea x two days. Patient unable to keep fluids or solids down regardless of phenergan suppository. Patient is hypotensive today, will send down to ER for fluids and further evaluation. Report given to [facility] nurse at the [place] [facility].

## 2013-05-07 NOTE — Progress Notes (Signed)
Subjective:    Patient ID: ORPHA DAIN, female    DOB: January 12, 1956, 58 y.o.   MRN: [id number]  Diarrhea  Associated symptoms include abdominal pain, chills, a fever, headaches and vomiting. Pertinent negatives include no coughing.   Ms. Trillo is a 58 year old female who presents today with a chief complaint of nausea and vomiting with some diarrhea since Sunday morning. Patient is reporting additional symptoms of body aches, fever, sore throat, right ear pain.  Patient reports she's unable to eat or drink without vomiting but has taken one of her daughters phenergan suppositories with mild relief of nausea.  However she has not been able to keep down any fluids or food in >36 hours despite phenergan suppository. Patient reports Tylenol has helped with body aches. Denies bloody stool. Patient reports she has not traveled outside of the country or eaten anything unusual.   Review of Systems  Constitutional: Positive for fever and chills.  HENT: Positive for ear pain and sore throat. Negative for congestion and rhinorrhea.   Respiratory: Negative for cough and shortness of breath.   Gastrointestinal: Positive for nausea, vomiting, abdominal pain and diarrhea. Negative for blood in stool and abdominal distention.       Reports generalized abdominal cramping.  Neurological: Positive for dizziness and headaches.   Past Medical History  Diagnosis Date  . Anxiety   . Depression   . Hypertension   . Allergy   . Persistent headaches   . Kidney stones   . Pneumonia   . Hyperlipidemia     History   Social History  . Marital Status: Legally Separated    Spouse Name: N/A    Number of Children: 2  . Years of Education: N/A   Occupational History  . Teacher assistant    Social History Main Topics  . Smoking status: Never Smoker   . Smokeless tobacco: Not on file  . Alcohol Use: No     Comment: rare  . Drug Use: No  . Sexual Activity: Not on file   Other Topics Concern    . Not on file   Social History Narrative   Regular exercise- yes, daily running, elliptical    Diet: fruits and veggies, water     Past Surgical History  Procedure Laterality Date  . Cesarean section  1987, 1989  . Exploratory laparotomy      laproscopy for infertility  . Cardiovascular stress test  02/2009    treadmill stress test: Low risk    Family History  Problem Relation Age of Onset  . Hypertension Mother   . Coronary artery disease Mother   . Heart failure Mother   . Hypertension Father   . Liver disease Sister     liver failure  . Arrhythmia Brother   . Hypertension Brother   . Cancer Neg Hx     colon     No Known Allergies  Current Outpatient Prescriptions on File Prior to Visit  Medication Sig Dispense Refill  . Calcium-Phosphorus-Vitamin D (CALCIUM GUMMIES) 250-100-500 MG-MG-UNIT CHEW Chew 800 mg by mouth daily. Taking 4 gummies daily      . clonazePAM (KLONOPIN) 0.5 MG tablet Take 0.25 mg by mouth at bedtime.       Marland Kitchen escitalopram (LEXAPRO) 10 MG tablet Take 15 mg by mouth daily. TAKE ONE AND HALF TABS DAILY      . folic acid (FOLVITE) 1 MG tablet Take 1 mg by mouth daily.      Marland Kitchen  gabapentin (NEURONTIN) 100 MG tablet Take 100 mg by mouth. At bedtime and onset of headache       . Ginkgo Biloba (GINKOBA PO) Take 1 tablet by mouth daily.      Marland Kitchen lamoTRIgine (LAMICTAL) 100 MG tablet Take 100 mg by mouth daily. TAKE ONE TABLET QAM AND ONE TABLETS AT BEDTIME      . Melatonin 1 MG TABS Take 1 tablet by mouth daily.      . pravastatin (PRAVACHOL) 20 MG tablet TAKE 1 TABLET BY MOUTH DAILY.  30 tablet  5  . QUEtiapine (SEROQUEL XR) 300 MG 24 hr tablet Take 300 mg by mouth 2 (two) times daily.        Marland Kitchen triamterene-hydrochlorothiazide (MAXZIDE-25) 37.5-25 MG per tablet TAKE 1 TABLET BY MOUTH EVERY DAY  30 tablet  5   No current facility-administered medications on file prior to visit.    BP 90/52  Pulse 75  Temp(Src) 98.1 F (36.7 C)  Resp 16  Wt 111 lb 0.6 oz  (50.367 kg)  SpO2 97%       Objective:   Physical Exam  Constitutional: She is oriented to person, place, and time. She appears well-nourished.  HENT:  Head: Normocephalic.  Gross amount of cerumen impacting vision of TM. Removed cerumen with curette and softening drops.  Eyes: Pupils are equal, round, and reactive to light.  Neck: Neck supple.  Cardiovascular: Normal rate and regular rhythm.   Pulmonary/Chest: Effort normal and breath sounds normal. No respiratory distress.  Abdominal: Soft. Bowel sounds are normal. She exhibits no distension and no mass. There is no tenderness. There is no rebound.  Lymphadenopathy:    She has no cervical adenopathy.  Neurological: She is alert and oriented to person, place, and time.  Skin: Skin is warm and dry.  Psychiatric: She has a normal mood and affect.          Assessment & Plan:  I have personally seen and examined patient and agree with Alma Friendly NP student's assessment and plan.

## 2013-05-07 NOTE — ED Notes (Signed)
MD at bedside discussing results and plan of care.  Ginger Ale given as fluid challenge.

## 2013-05-07 NOTE — Patient Instructions (Addendum)
Please go to the Emergency Room for further evaluation of dehydration.

## 2013-05-08 ENCOUNTER — Telehealth: Payer: Self-pay | Admitting: Family Medicine

## 2013-05-08 NOTE — Telephone Encounter (Signed)
Dizziness likely due to dehydration.  I agree with continuing to push fluids as tolerated today. As long as she is keeping it down, urination should start to pick up in the next few hours.  Try to rest today. Call if no improvement with urination in next 4 hours.

## 2013-05-08 NOTE — Telephone Encounter (Signed)
Patient called in stating that she was feeling a lot worse today and is more dizzy. She would like to know what she should do?

## 2013-05-08 NOTE — Telephone Encounter (Signed)
Notified pt and she voices understanding. 

## 2013-05-08 NOTE — Telephone Encounter (Signed)
Spoke with pt.  She states she received 1 bag of fluid in ER yesterday. She has only urinated once since leaving the ER. She reports 1/2 glass of pedialyte and 1/2 glass of tea and 2 pieces of toast at 9am this morning that she has kept down.  Pt states she had such a bad headache yesterday that she took 1 gabapentin at 4pm, 5pm and 9pm yesterday. States she has been very dizzy today when she tries to walk. Pt denies extremity weakness, slurred speech or visual changes. I encouraged pt to continue pushing clear liquid intake as tolerated. Pt wonders if dizziness is from her gabapentin or still the dehydration and do you have any additional recommendations?

## 2013-05-21 ENCOUNTER — Telehealth: Payer: Self-pay | Admitting: *Deleted

## 2013-05-21 NOTE — Telephone Encounter (Signed)
Received call from pt that she received an EOB stating [employer] was billed as her primary insurance and Medicare is her primary.  Provided pt with number for billing and she will contact them.

## 2013-05-28 DIAGNOSIS — F410 Panic disorder [episodic paroxysmal anxiety] without agoraphobia: Secondary | ICD-10-CM | POA: Diagnosis not present

## 2013-05-28 DIAGNOSIS — F333 Major depressive disorder, recurrent, severe with psychotic symptoms: Secondary | ICD-10-CM | POA: Diagnosis not present

## 2013-05-31 ENCOUNTER — Encounter: Payer: Self-pay | Admitting: Internal Medicine

## 2013-07-30 DIAGNOSIS — F333 Major depressive disorder, recurrent, severe with psychotic symptoms: Secondary | ICD-10-CM | POA: Diagnosis not present

## 2013-07-30 DIAGNOSIS — F22 Delusional disorders: Secondary | ICD-10-CM | POA: Diagnosis not present

## 2013-07-30 DIAGNOSIS — F410 Panic disorder [episodic paroxysmal anxiety] without agoraphobia: Secondary | ICD-10-CM | POA: Diagnosis not present

## 2013-08-06 ENCOUNTER — Other Ambulatory Visit: Payer: Self-pay | Admitting: Family Medicine

## 2013-08-06 NOTE — Telephone Encounter (Signed)
Med filled and letter mailed to pt to make appt/  

## 2013-08-09 DIAGNOSIS — E7800 Pure hypercholesterolemia, unspecified: Secondary | ICD-10-CM | POA: Diagnosis not present

## 2013-08-09 DIAGNOSIS — Z79899 Other long term (current) drug therapy: Secondary | ICD-10-CM | POA: Diagnosis not present

## 2013-08-23 ENCOUNTER — Ambulatory Visit: Payer: Medicare Other | Admitting: Family Medicine

## 2013-09-24 ENCOUNTER — Ambulatory Visit: Payer: BC Managed Care – PPO | Admitting: Family Medicine

## 2013-10-03 ENCOUNTER — Ambulatory Visit (INDEPENDENT_AMBULATORY_CARE_PROVIDER_SITE_OTHER): Payer: Medicare Other | Admitting: Family Medicine

## 2013-10-03 ENCOUNTER — Encounter: Payer: Self-pay | Admitting: Family Medicine

## 2013-10-03 VITALS — BP 110/80 | HR 82 | Temp 98.2°F | Resp 16 | Wt 114.4 lb

## 2013-10-03 DIAGNOSIS — I10 Essential (primary) hypertension: Secondary | ICD-10-CM | POA: Diagnosis not present

## 2013-10-03 DIAGNOSIS — E785 Hyperlipidemia, unspecified: Secondary | ICD-10-CM

## 2013-10-03 LAB — BASIC METABOLIC PANEL
BUN: 14 mg/dL (ref 6–23)
CO2: 30 meq/L (ref 19–32)
Calcium: 9.3 mg/dL (ref 8.4–10.5)
Chloride: 99 mEq/L (ref 96–112)
Creatinine, Ser: 0.8 mg/dL (ref 0.4–1.2)
GFR: 82.86 mL/min (ref >60.00)
GLUCOSE: 100 mg/dL — AB (ref 70–99)
POTASSIUM: 4 meq/L (ref 3.5–5.1)
Sodium: 138 mEq/L (ref 135–145)

## 2013-10-03 LAB — HEPATIC FUNCTION PANEL
ALT: 26 U/L (ref 0–35)
AST: 37 U/L (ref 0–37)
Albumin: 4.3 g/dL (ref 3.5–5.2)
Alkaline Phosphatase: 68 U/L (ref 39–117)
BILIRUBIN TOTAL: 0.3 mg/dL (ref 0.2–1.2)
Bilirubin, Direct: 0 mg/dL (ref 0.0–0.3)
Total Protein: 7.1 g/dL (ref 6.0–8.3)

## 2013-10-03 LAB — LIPID PANEL
Cholesterol: 177 mg/dL (ref 0–200)
HDL: 59.4 mg/dL (ref >39.00)
LDL Cholesterol: 101 mg/dL — ABNORMAL HIGH (ref 0–99)
NONHDL: 117.6
Total CHOL/HDL Ratio: 3
Triglycerides: 85 mg/dL (ref 0.0–149.0)
VLDL: 17 mg/dL (ref 0.0–40.0)

## 2013-10-03 NOTE — Progress Notes (Signed)
Pre visit review using our clinic review tool, if applicable. No additional management support is needed unless otherwise documented below in the visit note. 

## 2013-10-03 NOTE — Assessment & Plan Note (Signed)
Chronic problem.  Tolerating med w/o difficulty.  Check labs.  Adjust meds prn

## 2013-10-03 NOTE — Assessment & Plan Note (Signed)
Chronic problem.  Well controlled.  Asymptomatic.  Check labs.  No anticipated med changes. 

## 2013-10-03 NOTE — Progress Notes (Signed)
   Subjective:    Patient ID: Theresa Martin, female    DOB: 1955/06/30, 58 y.o.   MRN: [id number]  HPI HTN- chronic problem, on Triamterene HCTZ daily.  Excellent control.  No CP, SOB, HAs, visual changes, edema.  Hyperlipidemia- chronic problem, on Pravastatin.  No abd pain, N/V, myalgias.   Review of Systems For ROS see HPI     Objective:   Physical Exam  Vitals reviewed. Constitutional: She is oriented to person, place, and time. She appears well-developed and well-nourished. No distress.  HENT:  Head: Normocephalic and atraumatic.  Eyes: Conjunctivae and EOM are normal. Pupils are equal, round, and reactive to light.  Neck: Normal range of motion. Neck supple. No thyromegaly present.  Cardiovascular: Normal rate, regular rhythm, normal heart sounds and intact distal pulses.   No murmur heard. Pulmonary/Chest: Effort normal and breath sounds normal. No respiratory distress.  Abdominal: Soft. She exhibits no distension. There is no tenderness.  Musculoskeletal: She exhibits no edema.  Lymphadenopathy:    She has no cervical adenopathy.  Neurological: She is alert and oriented to person, place, and time.  Skin: Skin is warm and dry.  Psychiatric: She has a normal mood and affect. Her behavior is normal.          Assessment & Plan:

## 2013-10-03 NOTE — Patient Instructions (Signed)
Schedule your complete physical in 6 months We'll notify you of your lab results and make any changes if needed Call with any questions or concerns Keep up the good work!  You look great!! Enjoy the rest of your summer!!

## 2013-10-04 ENCOUNTER — Telehealth: Payer: Self-pay | Admitting: Family Medicine

## 2013-10-04 NOTE — Telephone Encounter (Signed)
Relevant patient education assigned to patient using Emmi. ° °

## 2013-10-09 ENCOUNTER — Telehealth: Payer: Self-pay | Admitting: *Deleted

## 2013-10-09 ENCOUNTER — Other Ambulatory Visit: Payer: Self-pay | Admitting: Family Medicine

## 2013-10-09 NOTE — Telephone Encounter (Signed)
Med filled.  

## 2013-10-09 NOTE — Telephone Encounter (Signed)
Caller name:  Reeve Relation to pt:  self Call back number: [phone]  Pharmacy:  Reason for call:   Pt called to request her recent lab results be faxed to Dr. Bridgett Larsson at fax # [phone].  If there is an issue please let pt know.  bw

## 2013-10-09 NOTE — Telephone Encounter (Signed)
Labs faxed

## 2013-10-12 DIAGNOSIS — Z1231 Encounter for screening mammogram for malignant neoplasm of breast: Secondary | ICD-10-CM | POA: Diagnosis not present

## 2013-10-12 DIAGNOSIS — Z1211 Encounter for screening for malignant neoplasm of colon: Secondary | ICD-10-CM | POA: Diagnosis not present

## 2013-10-12 DIAGNOSIS — Z124 Encounter for screening for malignant neoplasm of cervix: Secondary | ICD-10-CM | POA: Diagnosis not present

## 2013-10-12 DIAGNOSIS — Z1151 Encounter for screening for human papillomavirus (HPV): Secondary | ICD-10-CM | POA: Diagnosis not present

## 2013-10-12 DIAGNOSIS — Z01419 Encounter for gynecological examination (general) (routine) without abnormal findings: Secondary | ICD-10-CM | POA: Diagnosis not present

## 2013-10-29 DIAGNOSIS — F333 Major depressive disorder, recurrent, severe with psychotic symptoms: Secondary | ICD-10-CM | POA: Diagnosis not present

## 2013-10-29 DIAGNOSIS — F22 Delusional disorders: Secondary | ICD-10-CM | POA: Diagnosis not present

## 2013-10-29 DIAGNOSIS — F410 Panic disorder [episodic paroxysmal anxiety] without agoraphobia: Secondary | ICD-10-CM | POA: Diagnosis not present

## 2013-11-19 ENCOUNTER — Encounter: Payer: Self-pay | Admitting: Internal Medicine

## 2013-11-19 ENCOUNTER — Ambulatory Visit (INDEPENDENT_AMBULATORY_CARE_PROVIDER_SITE_OTHER): Payer: Medicare Other | Admitting: Internal Medicine

## 2013-11-19 VITALS — BP 126/68 | HR 78 | Temp 97.5°F | Wt 116.5 lb

## 2013-11-19 DIAGNOSIS — R51 Headache: Secondary | ICD-10-CM

## 2013-11-19 DIAGNOSIS — R519 Headache, unspecified: Secondary | ICD-10-CM

## 2013-11-19 NOTE — Progress Notes (Signed)
Pre-visit discussion using our clinic review tool. No additional management support is needed unless otherwise documented below in the visit note.  

## 2013-11-19 NOTE — Progress Notes (Signed)
Subjective:    Patient ID: Theresa Martin, female    DOB: 08-27-1955, 58 y.o.   MRN: [id number]  DOS:  11/19/2013 Type of visit - description: acute History: 2 weeks history of pain located at the left side of her scalp, neck, nuchal area, proximal left shoulder and left throat. She has like a constant achiness, worse when she touch even slightly her scalp or her skin. Symptoms are completely different from previous headaches. The only thing different is that  she increased Neurontin from 100 mg each bedtime to 300 mg each bedtime.    ROS Denies fever chills No weight loss No chest pain, nausea, vomiting or diarrhea No sinus pain or congestion No rash in the head or neck. No upper or lower extremity paresthesias. No dizzines-diplopia   Past Medical History  Diagnosis Date  . Anxiety   . Depression   . Hypertension   . Allergy   . Persistent headaches   . Kidney stones   . Pneumonia   . Hyperlipidemia     Past Surgical History  Procedure Laterality Date  . Cesarean section  1987, 1989  . Exploratory laparotomy      laproscopy for infertility  . Cardiovascular stress test  02/2009    treadmill stress test: Low risk    History   Social History  . Marital Status: Married    Spouse Name: N/A    Number of Children: 2  . Years of Education: N/A   Occupational History  . Teacher assistant    Social History Main Topics  . Smoking status: Never Smoker   . Smokeless tobacco: Not on file  . Alcohol Use: No     Comment: rare  . Drug Use: No  . Sexual Activity: Not on file   Other Topics Concern  . Not on file   Social History Narrative   Regular exercise- yes, daily running, elliptical    Diet: fruits and veggies, water         Medication List       This list is accurate as of: 11/19/13 11:59 PM.  Always use your most recent med list.               escitalopram 10 MG tablet  Commonly known as:  LEXAPRO  Take 15 mg by mouth daily. TAKE ONE  AND HALF TABS DAILY     folic acid 1 MG tablet  Commonly known as:  FOLVITE  Take 1 mg by mouth daily.     gabapentin 100 MG tablet  Commonly known as:  NEURONTIN  Take 100 mg by mouth. At bedtime and onset of headache     GINKOBA PO  Take 1 tablet by mouth daily.     lamoTRIgine 100 MG tablet  Commonly known as:  LAMICTAL  Take 100 mg by mouth daily. TAKE ONE TABLET QAM AND ONE TABLETS AT BEDTIME     Melatonin 1 MG Tabs  Take 1 tablet by mouth daily.     pravastatin 20 MG tablet  Commonly known as:  PRAVACHOL  TAKE 1 TABLET BY MOUTH DAILY.     QUEtiapine 300 MG 24 hr tablet  Commonly known as:  SEROQUEL XR  Take 300 mg by mouth 2 (two) times daily.     triamterene-hydrochlorothiazide 37.5-25 MG per tablet  Commonly known as:  MAXZIDE-25  TAKE 1 TABLET BY MOUTH EVERY DAY           Objective:   Physical Exam  BP 126/68  Pulse 78  Temp(Src) 97.5 F (36.4 C) (Oral)  Wt 116 lb 8 oz (52.844 kg)  SpO2 97%  General -- alert, well-developed, NAD.  Neck - FROM (Slight increase in the pain when she turns to the right) and no TTP HEENT-- Not pale. TMs normal, throat symmetric, no redness or discharge. Face symmetric, sinuses not tender to palpation. Nose not congested.  Lungs -- normal respiratory effort, no intercostal retractions, no accessory muscle use, and normal breath sounds.  Heart-- normal rate, regular rhythm, no murmur.  skin--  Scalp, face, head and neck without any rash Extremities-- no pretibial edema bilaterally  Neurologic--  alert & oriented X3. Speech normal, gait appropriate for age, strength symmetric and appropriate for age.  DTRs symmetric. EOMI, PERLA   Psych-- Cognition and judgment appear intact. Cooperative with normal attention span and concentration. No anxious or depressed appearing.       Assessment & Plan:   Cephalalgia, 58 year old lady with history of migraine headaches on multiple medications who presents with pain in the left side  of the head, scalp, throat. Neuralgia?   Occipital neuralgia?. Symptoms started after Neurontin dose was increased from 100 mg to 10 mg each bedtime. Plan: CBC, sed rate Go back to previous Neurontin dose Will refer back to her neurologist

## 2013-11-19 NOTE — Patient Instructions (Signed)
Get your blood work before you leave    until you see neurology, call if the symptoms get worse

## 2013-11-20 LAB — CBC WITH DIFFERENTIAL/PLATELET
Basophils Absolute: 0 10*3/uL (ref 0.0–0.1)
Basophils Relative: 0.6 % (ref 0.0–3.0)
EOS ABS: 0 10*3/uL (ref 0.0–0.7)
Eosinophils Relative: 0.2 % (ref 0.0–5.0)
HCT: 37.4 % (ref 36.0–46.0)
HEMOGLOBIN: 12.6 g/dL (ref 12.0–15.0)
LYMPHS ABS: 1.1 10*3/uL (ref 0.7–4.0)
Lymphocytes Relative: 21.8 % (ref 12.0–46.0)
MCHC: 33.8 g/dL (ref 30.0–36.0)
MCV: 93.3 fl (ref 78.0–100.0)
Monocytes Absolute: 0.3 10*3/uL (ref 0.1–1.0)
Monocytes Relative: 6.2 % (ref 3.0–12.0)
Neutro Abs: 3.5 10*3/uL (ref 1.4–7.7)
Neutrophils Relative %: 71.2 % (ref 43.0–77.0)
Platelets: 196 10*3/uL (ref 150.0–400.0)
RBC: 4.01 Mil/uL (ref 3.87–5.11)
RDW: 12.1 % (ref 11.5–15.5)
WBC: 4.9 10*3/uL (ref 4.0–10.5)

## 2013-11-20 LAB — SEDIMENTATION RATE: Sed Rate: 22 mm/hr (ref 0–22)

## 2013-11-21 ENCOUNTER — Other Ambulatory Visit: Payer: Self-pay | Admitting: Family Medicine

## 2013-11-21 NOTE — Telephone Encounter (Signed)
Med filled.  

## 2013-11-22 DIAGNOSIS — F333 Major depressive disorder, recurrent, severe with psychotic symptoms: Secondary | ICD-10-CM | POA: Diagnosis not present

## 2013-11-22 DIAGNOSIS — F22 Delusional disorders: Secondary | ICD-10-CM | POA: Diagnosis not present

## 2013-11-22 DIAGNOSIS — F410 Panic disorder [episodic paroxysmal anxiety] without agoraphobia: Secondary | ICD-10-CM | POA: Diagnosis not present

## 2013-12-04 ENCOUNTER — Other Ambulatory Visit: Payer: Self-pay | Admitting: Family Medicine

## 2013-12-04 NOTE — Telephone Encounter (Signed)
Last filled: 10/09/13 Amt filled:  30 tablets, 1 refill Last OV/Labs: 10/03/13  Med filled x 6 months.

## 2013-12-26 DIAGNOSIS — F333 Major depressive disorder, recurrent, severe with psychotic symptoms: Secondary | ICD-10-CM | POA: Diagnosis not present

## 2013-12-26 DIAGNOSIS — F410 Panic disorder [episodic paroxysmal anxiety] without agoraphobia: Secondary | ICD-10-CM | POA: Diagnosis not present

## 2014-01-22 DIAGNOSIS — N9089 Other specified noninflammatory disorders of vulva and perineum: Secondary | ICD-10-CM | POA: Diagnosis not present

## 2014-02-01 DIAGNOSIS — F410 Panic disorder [episodic paroxysmal anxiety] without agoraphobia: Secondary | ICD-10-CM | POA: Diagnosis not present

## 2014-02-01 DIAGNOSIS — F3341 Major depressive disorder, recurrent, in partial remission: Secondary | ICD-10-CM | POA: Diagnosis not present

## 2014-02-07 ENCOUNTER — Encounter: Payer: Self-pay | Admitting: General Surgery

## 2014-02-07 ENCOUNTER — Ambulatory Visit (INDEPENDENT_AMBULATORY_CARE_PROVIDER_SITE_OTHER): Payer: Medicare Other | Admitting: General Surgery

## 2014-02-07 VITALS — BP 132/64 | HR 76 | Resp 12 | Ht 61.0 in | Wt 118.0 lb

## 2014-02-07 DIAGNOSIS — N6459 Other signs and symptoms in breast: Secondary | ICD-10-CM

## 2014-02-07 NOTE — Patient Instructions (Signed)
Patient to return in 3 months for follow up. The patient is aware to call back for any questions or concerns.  

## 2014-02-07 NOTE — Progress Notes (Signed)
Patient ID: Theresa Martin, female   DOB: 14-Jan-1956, 58 y.o.   MRN: [id number]  Chief Complaint  Patient presents with  . Other    evaluation of right breast changes     HPI Theresa Martin is a 58 y.o. female who presents for an evaluation of right breast changes. The patient states her right nipple has changed in color. The coloration has gotten lighter. She noticed it approximately 1-2 months ago. She denies any other symptoms or changes in the breast. Her last mammogram was done at [facility] on 10/12/2013. She does not do self breast checks but does get regular mammograms.   HPI  Past Medical History  Diagnosis Date  . Anxiety   . Depression   . Hypertension   . Allergy   . Persistent headaches   . Kidney stones   . Pneumonia   . Hyperlipidemia     Past Surgical History  Procedure Laterality Date  . Cesarean section  1987, 1989  . Exploratory laparotomy      laproscopy for infertility  . Cardiovascular stress test  02/2009    treadmill stress test: Low risk    Family History  Problem Relation Age of Onset  . Hypertension Mother   . Coronary artery disease Mother   . Heart failure Mother   . Hypertension Father   . Liver disease Sister     liver failure  . Arrhythmia Brother   . Hypertension Brother   . Cancer Neg Hx     colon     Social History History  Substance Use Topics  . Smoking status: Never Smoker   . Smokeless tobacco: Not on file  . Alcohol Use: No     Comment: rare    No Known Allergies  Current Outpatient Prescriptions  Medication Sig Dispense Refill  . Calcium Carbonate (CALCIUM 600 PO) Take 2 tablets by mouth daily.    . clonazePAM (KLONOPIN) 0.5 MG tablet Take 0.5 tablets by mouth at bedtime.  2  . escitalopram (LEXAPRO) 10 MG tablet Take 15 mg by mouth daily. TAKE ONE AND HALF TABS DAILY    . folic acid (FOLVITE) 1 MG tablet Take 1 mg by mouth daily.    Marland Kitchen gabapentin (NEURONTIN) 100 MG tablet Take 100 mg by mouth. At bedtime  and onset of headache    . Glucosamine-Chondroitin (GLUCOSAMINE CHONDR COMPLEX PO) Take 1 tablet by mouth daily.    Marland Kitchen lamoTRIgine (LAMICTAL) 100 MG tablet Take 100 mg by mouth daily. TAKE ONE TABLET QAM AND ONE TABLETS AT BEDTIME    . Melatonin 1 MG TABS Take 1 tablet by mouth daily.    . pravastatin (PRAVACHOL) 20 MG tablet TAKE 1 TABLET BY MOUTH DAILY. 30 tablet 12  . QUEtiapine (SEROQUEL XR) 300 MG 24 hr tablet Take 300 mg by mouth 2 (two) times daily.      Marland Kitchen triamterene-hydrochlorothiazide (MAXZIDE-25) 37.5-25 MG per tablet TAKE 1 TABLET BY MOUTH EVERY DAY 30 tablet 5   No current facility-administered medications for this visit.    Review of Systems Review of Systems  Constitutional: Negative.   Respiratory: Negative.   Cardiovascular: Negative.     Blood pressure 132/64, pulse 76, resp. rate 12, height 5\' 1"  (7.371 m), weight 118 lb (53.524 kg).  Physical Exam Physical Exam  Constitutional: She is oriented to person, place, and time. She appears well-developed and well-nourished.  Neck: Neck supple. No thyromegaly present.  Cardiovascular: Normal rate, regular rhythm and normal heart sounds.  No murmur heard. Pulmonary/Chest: Effort normal and breath sounds normal. Right breast exhibits no inverted nipple, no mass, no nipple discharge, no skin change and no tenderness. Left breast exhibits no inverted nipple, no mass, no nipple discharge, no skin change and no tenderness.    Blue halo coloration on upper edge of right nipple.   Bilateral transverse crease in nipples.   Lymphadenopathy:    She has no cervical adenopathy.    She has no axillary adenopathy.  Neurological: She is alert and oriented to person, place, and time.  Skin: Skin is warm and dry.    Data Reviewed Mammogram dated 10/12/2013 was reviewed and reported as normal. Dense breast described. BI-RADS-1.  Assessment    Possible change in coloration of these kin of the right breast, otherwise normal exam.      Plan    The patient reports that she does not check her breasts, nor does she look at them. There was a confluence of events where she was trying on new undergarments in a very brightly lit room that brought the area of discoloration to her attention. Whether this is been present for weeks months or years is unknown.  Her exam is entirely benign, and at this time I think observation is reasonable.    PCP:  Annye Asa Ref. MD: Johny Drilling Gutirierrez  Robert Bellow 02/08/2014, 8:39 PM

## 2014-02-08 ENCOUNTER — Telehealth: Payer: Self-pay | Admitting: Family Medicine

## 2014-02-08 DIAGNOSIS — N6459 Other signs and symptoms in breast: Secondary | ICD-10-CM | POA: Insufficient documentation

## 2014-02-08 NOTE — Telephone Encounter (Signed)
I recommend shingles if covered by insurance  Depo-medrol is not usually for joint pain.  Cortisone joint injxns are typically for joint pain and done by Sports Med or Ortho.

## 2014-02-08 NOTE — Telephone Encounter (Signed)
Do you recommend a shingles injection? depo medrol for joint pain?

## 2014-02-08 NOTE — Telephone Encounter (Signed)
Pt notified. Does not want a referral to ortho right now and will contact her insurance about the zoster vaccination.

## 2014-02-20 DIAGNOSIS — J060 Acute laryngopharyngitis: Secondary | ICD-10-CM | POA: Diagnosis not present

## 2014-02-26 DIAGNOSIS — B029 Zoster without complications: Secondary | ICD-10-CM

## 2014-02-26 HISTORY — DX: Zoster without complications: B02.9

## 2014-02-27 DIAGNOSIS — B029 Zoster without complications: Secondary | ICD-10-CM | POA: Diagnosis not present

## 2014-03-14 DIAGNOSIS — F331 Major depressive disorder, recurrent, moderate: Secondary | ICD-10-CM | POA: Diagnosis not present

## 2014-03-18 DIAGNOSIS — E0965 Drug or chemical induced diabetes mellitus with hyperglycemia: Secondary | ICD-10-CM | POA: Diagnosis not present

## 2014-03-18 DIAGNOSIS — E785 Hyperlipidemia, unspecified: Secondary | ICD-10-CM | POA: Diagnosis not present

## 2014-04-09 DIAGNOSIS — N9089 Other specified noninflammatory disorders of vulva and perineum: Secondary | ICD-10-CM | POA: Diagnosis not present

## 2014-05-08 DIAGNOSIS — F411 Generalized anxiety disorder: Secondary | ICD-10-CM | POA: Diagnosis not present

## 2014-05-08 DIAGNOSIS — F331 Major depressive disorder, recurrent, moderate: Secondary | ICD-10-CM | POA: Diagnosis not present

## 2014-05-09 ENCOUNTER — Encounter: Payer: Self-pay | Admitting: General Surgery

## 2014-05-09 ENCOUNTER — Ambulatory Visit (INDEPENDENT_AMBULATORY_CARE_PROVIDER_SITE_OTHER): Payer: Medicare Other | Admitting: General Surgery

## 2014-05-09 VITALS — BP 120/68 | HR 72 | Resp 14 | Ht 61.0 in | Wt 119.0 lb

## 2014-05-09 DIAGNOSIS — N6459 Other signs and symptoms in breast: Secondary | ICD-10-CM | POA: Diagnosis not present

## 2014-05-09 NOTE — Patient Instructions (Signed)
Continue self breast exams. Call office for any new breast issues or concerns. 

## 2014-05-09 NOTE — Progress Notes (Signed)
Patient ID: Theresa Martin, female   DOB: 13-Jul-1955, 59 y.o.   MRN: [id number]  Chief Complaint  Patient presents with  . Follow-up    right nipple discoloration    HPI Theresa Martin is a 59 y.o. female following up here for discoloration of right nipple. She states that their has been no change in the area since she was last seen here three months ago. She denies and pain, discomfort, or nipple discharge.  HPI  Past Medical History  Diagnosis Date  . Anxiety   . Depression   . Hypertension   . Allergy   . Persistent headaches   . Kidney stones   . Pneumonia   . Hyperlipidemia   . Shingles outbreak 02/2014    Past Surgical History  Procedure Laterality Date  . Cesarean section  1987, 1989  . Exploratory laparotomy      laproscopy for infertility  . Cardiovascular stress test  02/2009    treadmill stress test: Low risk    Family History  Problem Relation Age of Onset  . Hypertension Mother   . Coronary artery disease Mother   . Heart failure Mother   . Hypertension Father   . Liver disease Sister     liver failure  . Arrhythmia Brother   . Hypertension Brother   . Cancer Neg Hx     colon     Social History History  Substance Use Topics  . Smoking status: Never Smoker   . Smokeless tobacco: Not on file  . Alcohol Use: No     Comment: rare    No Known Allergies  Current Outpatient Prescriptions  Medication Sig Dispense Refill  . Calcium Carbonate (CALCIUM 600 PO) Take 2 tablets by mouth daily.    . clonazePAM (KLONOPIN) 0.5 MG tablet Take 0.5 tablets by mouth at bedtime.  2  . escitalopram (LEXAPRO) 10 MG tablet Take 15 mg by mouth daily. TAKE ONE AND HALF TABS DAILY    . fluocinonide ointment (LIDEX) 0.05 % as needed.   2  . folic acid (FOLVITE) 1 MG tablet Take 1 mg by mouth daily.    Marland Kitchen gabapentin (NEURONTIN) 100 MG tablet Take 100 mg by mouth. At bedtime and onset of headache    . Glucosamine-Chondroitin (MOVE FREE PO) Take 1 tablet by  mouth daily.    Marland Kitchen lamoTRIgine (LAMICTAL) 100 MG tablet Take 100 mg by mouth daily. TAKE ONE TABLET QAM AND ONE TABLETS AT BEDTIME    . pravastatin (PRAVACHOL) 20 MG tablet TAKE 1 TABLET BY MOUTH DAILY. 30 tablet 12  . QUEtiapine (SEROQUEL XR) 300 MG 24 hr tablet Take 300 mg by mouth 2 (two) times daily.      Marland Kitchen triamterene-hydrochlorothiazide (MAXZIDE-25) 37.5-25 MG per tablet TAKE 1 TABLET BY MOUTH EVERY DAY 30 tablet 5   No current facility-administered medications for this visit.    Review of Systems Review of Systems  Constitutional: Negative.   HENT: Negative.   Respiratory: Negative.     Blood pressure 120/68, pulse 72, resp. rate 14, height 5\' 1"  (1.549 m), weight 119 lb (53.978 kg).  Physical Exam Physical Exam  Constitutional: She is oriented to person, place, and time. She appears well-developed and well-nourished.  Eyes: Conjunctivae are normal. No scleral icterus.  Neck: Neck supple.  Cardiovascular: Normal rate and regular rhythm.   Pulmonary/Chest: Effort normal and breath sounds normal. Right breast exhibits no inverted nipple, no mass, no nipple discharge, no skin change and no tenderness.  Left breast exhibits no inverted nipple, no mass, no nipple discharge, no skin change and no tenderness.    Lymphadenopathy:    She has no cervical adenopathy.  Neurological: She is alert and oriented to person, place, and time.    Data Reviewed Prior mammograms.  Assessment    Faint, asymptomatic and unexplained discoloration of the form-5 mm edge of the upper areola on the right breast    Plan    At this time I don't see a benefit to doing a punch biopsy. Previous mammogram review is unremarkable. Observation is reasonable. The patient was encouraged to call if there is any change. She should continue screening annual mammograms.    PCP: Annye Asa Ref. MD: Johny Drilling Gutirierrez   Robert Bellow 05/10/2014, 7:40 PM

## 2014-05-10 DIAGNOSIS — N6459 Other signs and symptoms in breast: Secondary | ICD-10-CM | POA: Insufficient documentation

## 2014-05-13 ENCOUNTER — Encounter: Payer: Self-pay | Admitting: General Practice

## 2014-05-13 ENCOUNTER — Other Ambulatory Visit: Payer: Self-pay | Admitting: General Practice

## 2014-05-13 MED ORDER — TRIAMTERENE-HCTZ 37.5-25 MG PO TABS: 1.0000 | ORAL_TABLET | Freq: Every day | ORAL | Status: DC

## 2014-05-15 ENCOUNTER — Telehealth: Payer: Self-pay | Admitting: Family Medicine

## 2014-05-15 DIAGNOSIS — R51 Headache: Principal | ICD-10-CM

## 2014-05-15 DIAGNOSIS — R519 Headache, unspecified: Secondary | ICD-10-CM

## 2014-05-15 NOTE — Telephone Encounter (Signed)
This referral was placed today.  

## 2014-05-15 NOTE — Telephone Encounter (Signed)
Caller name: [place] Relation to pt: self Call back number: [phone] Pharmacy:  Reason for call:   Needs a referral to her neurologist. She hasn't been seen in five years and needs another referral. Dr. Leta Baptist at [facility]. Headaches. Does not have appointment as of yet.

## 2014-05-17 ENCOUNTER — Ambulatory Visit (INDEPENDENT_AMBULATORY_CARE_PROVIDER_SITE_OTHER): Payer: Medicare Other | Admitting: Diagnostic Neuroimaging

## 2014-05-17 ENCOUNTER — Encounter: Payer: Self-pay | Admitting: Diagnostic Neuroimaging

## 2014-05-17 VITALS — BP 161/63 | HR 73 | Ht 61.0 in | Wt 119.0 lb

## 2014-05-17 DIAGNOSIS — G44209 Tension-type headache, unspecified, not intractable: Secondary | ICD-10-CM | POA: Diagnosis not present

## 2014-05-17 DIAGNOSIS — M5481 Occipital neuralgia: Secondary | ICD-10-CM

## 2014-05-17 NOTE — Progress Notes (Signed)
[facility]  PATIENT: Theresa Martin DOB: 1955/07/22  REFERRING CLINICIAN: Tabori HISTORY FROM: patient and father REASON FOR VISIT: new consult   HISTORICAL  CHIEF COMPLAINT:  Chief Complaint  Patient presents with  . New Evaluation    headaches    HISTORY OF PRESENT ILLNESS:   UPDATE 05/17/14: Since last visit, patient had some psychiatry issues, was managed by behavioral health, and her headaches significant improved. She was doing well for several years and did not follow-up in our clinic. Patient here with her father today for this visit. Patient's father notes that her headaches seem to be significantly associated with her stress levels. In the last few months her stress levels have increased significantly. Her stress levels related to her ex-husband and her daughters, and some family issues. Patient having intermittent, almost daily left-sided headaches, with numbness and tingling in the left scalp, typically in the evening when she is home. During the daytime patient stays active, works out several times a week, and does better. Patient had been on gabapentin 100 mg at bedtime for several years. Her psychiatrist increased this to 2 and then 3 capsules at bedtime. When she did 3 capsules at bedtime, her headache type symptoms paradoxical he worsened. She then went back to taking one capsule at bedtime. Strangely, she reports that she was told she was able to take this medication (gabapentin) as needed as well and for the last few weeks has been taking one capsule at bedtime, followed by 1-2 capsules every hour, throughout the night, taking up to 10-14 capsules in a 12 hour period.   PRIOR HPI (11/04/08 - 06/03/09, VRP): 59 year old right-handed female with history of high blood pressure, depression and anxiety presenting for evaluation of chronic headaches and abnormal MRI scan.  On October 26, 2008 the patient presented to the emergency room for right-sided chest  pain and was diagnosed with pneumonia.   At this time her headaches worsened in severity.  Upon review of prior MRIs demonstrating microvascular gliosis, patient was referred to our neurology clinic for futher evaluation. Patient's headaches began in 2005 consisting of a "dull pressure like sensation" over the top of her head. Occasionally these are associated with mild nausea without vomiting as well as intermittent left facial numbness.  Patient had dull nagging low level headaches on a daily basis with daily flareups involving a hot sensation over her scalp.  Sometimes this sensation starts as posterior neck pain that moves up the back of her head. She was initially evaluated with MRI scans in 2005 and 2006 and started on topiramate 100 mg at bedtime.   In addition she was taking Advil and Tylenol over-the-counter almost a daily basis.   During flareups the patient would have to lay down in place ice packs over her eyes and the top of her head.   Patient's headaches are aggravated by lack of sleep or stress. No food triggers noted.  Her other physicians decided to taper her off of NSAIDs, tylenol and also her topiramate. Patient had MRI of the head and neck, found to have some nonspecific white matter lesions. Patient had lumbar puncture and additional blood testing, but multiple sclerosis was ruled out. Patient was treated with several occipital nerve blocks with good results.   REVIEW OF SYSTEMS: Full 14 system review of systems performed and notable only for depression anxiety not asleep racing thoughts insomnia sleepiness snoring restless legs memory loss confusion headache feeling hot fatigue.  ALLERGIES: No Known Allergies  HOME MEDICATIONS: Outpatient Prescriptions Prior to Visit  Medication Sig Dispense Refill  . Calcium Carbonate (CALCIUM 600 PO) Take 2 tablets by mouth daily.    . clonazePAM (KLONOPIN) 0.5 MG tablet Take 0.5 tablets by mouth at bedtime.  2  . escitalopram (LEXAPRO) 10 MG  tablet Take 15 mg by mouth daily. TAKE ONE AND HALF TABS DAILY    . fluocinonide ointment (LIDEX) 0.05 % as needed.   2  . folic acid (FOLVITE) 1 MG tablet Take 1 mg by mouth daily.    Marland Kitchen gabapentin (NEURONTIN) 100 MG tablet Take 100 mg by mouth. At bedtime and onset of headache    . Glucosamine-Chondroitin (MOVE FREE PO) Take 1 tablet by mouth daily.    Marland Kitchen lamoTRIgine (LAMICTAL) 100 MG tablet Take 100 mg by mouth daily. TAKE ONE TABLET QAM AND ONE TABLETS AT BEDTIME    . pravastatin (PRAVACHOL) 20 MG tablet TAKE 1 TABLET BY MOUTH DAILY. 30 tablet 12  . QUEtiapine (SEROQUEL XR) 300 MG 24 hr tablet Take 300 mg by mouth 2 (two) times daily.      Marland Kitchen triamterene-hydrochlorothiazide (MAXZIDE-25) 37.5-25 MG per tablet Take 1 tablet by mouth daily. 30 tablet 0   No facility-administered medications prior to visit.    PAST MEDICAL HISTORY: Past Medical History  Diagnosis Date  . Anxiety   . Depression   . Hypertension   . Allergy   . Persistent headaches   . Kidney stones   . Pneumonia   . Hyperlipidemia   . Shingles outbreak 02/2014    PAST SURGICAL HISTORY: Past Surgical History  Procedure Laterality Date  . Cesarean section  1987, 1989  . Exploratory laparotomy      laproscopy for infertility  . Cardiovascular stress test  02/2009    treadmill stress test: Low risk    FAMILY HISTORY: Family History  Problem Relation Age of Onset  . Hypertension Mother   . Coronary artery disease Mother   . Heart failure Mother   . Hypertension Father   . Liver disease Sister     liver failure  . Arrhythmia Brother   . Hypertension Brother   . Cancer Neg Hx     colon     SOCIAL HISTORY:  History   Social History  . Marital Status: Legally Separated    Spouse Name: N/A  . Number of Children: 2  . Years of Education: N/A   Occupational History  . Teacher assistant    Social History Main Topics  . Smoking status: Never Smoker   . Smokeless tobacco: Not on file  . Alcohol Use:  No     Comment: rare  . Drug Use: No  . Sexual Activity: Not on file   Other Topics Concern  . Not on file   Social History Narrative   Regular exercise- yes, daily running, elliptical    Diet: fruits and veggies, water      PHYSICAL EXAM  Filed Vitals:   05/17/14 0819  BP: 161/63  Pulse: 73  Height: 5\' 1"  (1.549 m)  Weight: 119 lb (53.978 kg)    Body mass index is 22.5 kg/(m^2).  No exam data present  No flowsheet data found.  GENERAL EXAM: Patient is in no distress; well developed, nourished and groomed; neck is supple  CARDIOVASCULAR: Regular rate and rhythm, no murmurs, no carotid bruits  NEUROLOGIC: MENTAL STATUS: awake, alert, oriented to person, place and time, recent and remote memory POOR, DECR ATTENTION AND CONCENTRATION; language  fluent, comprehension intact, naming intact, fund of knowledge appropriate CRANIAL NERVE: no papilledema on fundoscopic exam, pupils equal and reactive to light, visual fields full to confrontation, extraocular muscles intact, no nystagmus, facial sensation and strength symmetric, hearing intact, palate elevates symmetrically, uvula midline, shoulder shrug symmetric, tongue midline. MOTOR: normal bulk and tone, full strength in the BUE, BLE SENSORY: normal and symmetric to light touch, temperature, vibration COORDINATION: finger-nose-finger, fine finger movements normal REFLEXES: deep tendon reflexes present and symmetric GAIT/STATION: narrow based gait; able to walk on toes, heels and tandem; romberg is negative    DIAGNOSTIC DATA (LABS, IMAGING, TESTING) - I reviewed patient records, labs, notes, testing and imaging myself where available.  Lab Results  Component Value Date   WBC 4.9 11/19/2013   HGB 12.6 11/19/2013   HCT 37.4 11/19/2013   MCV 93.3 11/19/2013   PLT 196.0 11/19/2013      Component Value Date/Time   NA 138 10/03/2013 1141   K 4.0 10/03/2013 1141   CL 99 10/03/2013 1141   CO2 30 10/03/2013 1141    GLUCOSE 100* 10/03/2013 1141   BUN 14 10/03/2013 1141   CREATININE 0.8 10/03/2013 1141   CALCIUM 9.3 10/03/2013 1141   PROT 7.1 10/03/2013 1141   ALBUMIN 4.3 10/03/2013 1141   AST 37 10/03/2013 1141   ALT 26 10/03/2013 1141   ALKPHOS 68 10/03/2013 1141   BILITOT 0.3 10/03/2013 1141   GFRNONAA 80* 05/07/2013 1525   GFRAA >90 05/07/2013 1525   Lab Results  Component Value Date   CHOL 177 10/03/2013   HDL 59.40 10/03/2013   LDLCALC 101* 10/03/2013   TRIG 85.0 10/03/2013   CHOLHDL 3 10/03/2013   Lab Results  Component Value Date   HGBA1C 5.7 10/18/2011   Lab Results  Component Value Date   VITAMINB12 782 01/16/2010   Lab Results  Component Value Date   TSH 0.89 07/27/2012     11/13/08 LP - opening pressure 7cm H2O; WBC 1, RBC 0, glucose 62, protein 26, OCB (2 is CSF, not seen in serum), IgG index 0.5 (normal), lyme PCR neg, EBV PCR neg  11/13/08 VEP - normal  11/08/08 MRI cervical - normal  11/08/08 MRI brain (with and without contrast) - multiple supratentorial, periventricular and juxtacortical white matter lesions which may represent chronic demyelinating plaques or perivascular gliosis.  No abnormal enhancement on postcontrast views.    ASSESSMENT AND PLAN  59 y.o. year old female here with mixed tension and migraine headaches, left occipital neuralgia, and longer standing significant depression/anxiety. Current headaches have occipital neuralgia type features. Symptoms seem to be worse with stress and external factors.  PLAN: - Increase gabapentin up to 200 mg 3 times per day (do not take this medication as needed nor EVERY hour as previously - may consider cyclobenzaprine or occipital nerve blocks in future - continue psychiatry evaluations; consider meeting with psychologist as well   Return in about 3 months (around 08/15/2014).    Penni Bombard, MD 9/67/5916, 3:84 AM Certified in Neurology, Neurophysiology and Neuroimaging  [facility] [address] [place], [place] [address] [phone]

## 2014-05-17 NOTE — Patient Instructions (Signed)
Take gabapentin 200mg  at bedtime for 1 week; then increase to 200mg  twice a day.  Be consistent with gabapentin dosing. Do not take every hour or as needed.  You may use ibuprofen, aleve or tylenol as needed for breakthrough headaches (up to 5 days per month).  Follow up with psychologist.

## 2014-05-24 ENCOUNTER — Ambulatory Visit (INDEPENDENT_AMBULATORY_CARE_PROVIDER_SITE_OTHER): Payer: Medicare Other | Admitting: Family Medicine

## 2014-05-24 ENCOUNTER — Ambulatory Visit: Payer: Medicare Other | Admitting: Family Medicine

## 2014-05-24 ENCOUNTER — Encounter: Payer: Self-pay | Admitting: Family Medicine

## 2014-05-24 VITALS — BP 151/79 | HR 77 | Temp 97.8°F | Resp 16 | Ht 61.0 in | Wt 118.0 lb

## 2014-05-24 DIAGNOSIS — F331 Major depressive disorder, recurrent, moderate: Secondary | ICD-10-CM | POA: Diagnosis not present

## 2014-05-24 DIAGNOSIS — I10 Essential (primary) hypertension: Secondary | ICD-10-CM

## 2014-05-24 DIAGNOSIS — E785 Hyperlipidemia, unspecified: Secondary | ICD-10-CM

## 2014-05-24 DIAGNOSIS — Z0000 Encounter for general adult medical examination without abnormal findings: Secondary | ICD-10-CM | POA: Diagnosis not present

## 2014-05-24 LAB — BASIC METABOLIC PANEL
BUN: 14 mg/dL (ref 6–23)
CALCIUM: 10 mg/dL (ref 8.4–10.5)
CHLORIDE: 102 meq/L (ref 96–112)
CO2: 34 meq/L — AB (ref 19–32)
CREATININE: 0.79 mg/dL (ref 0.40–1.20)
GFR: 79.07 mL/min (ref >60.00)
GLUCOSE: 105 mg/dL — AB (ref 70–99)
Potassium: 4.7 mEq/L (ref 3.5–5.1)
Sodium: 140 mEq/L (ref 135–145)

## 2014-05-24 LAB — CBC WITH DIFFERENTIAL/PLATELET
Basophils Absolute: 0 10*3/uL (ref 0.0–0.1)
Basophils Relative: 0.4 % (ref 0.0–3.0)
EOS ABS: 0 10*3/uL (ref 0.0–0.7)
Eosinophils Relative: 1.1 % (ref 0.0–5.0)
HCT: 39.3 % (ref 36.0–46.0)
Hemoglobin: 13.4 g/dL (ref 12.0–15.0)
LYMPHS PCT: 31.7 % (ref 12.0–46.0)
Lymphs Abs: 1 10*3/uL (ref 0.7–4.0)
MCHC: 34 g/dL (ref 30.0–36.0)
MCV: 91.3 fl (ref 78.0–100.0)
MONOS PCT: 9.3 % (ref 3.0–12.0)
Monocytes Absolute: 0.3 10*3/uL (ref 0.1–1.0)
NEUTROS PCT: 57.5 % (ref 43.0–77.0)
Neutro Abs: 1.8 10*3/uL (ref 1.4–7.7)
PLATELETS: 211 10*3/uL (ref 150.0–400.0)
RBC: 4.31 Mil/uL (ref 3.87–5.11)
RDW: 12.2 % (ref 11.5–15.5)
WBC: 3.2 10*3/uL — ABNORMAL LOW (ref 4.0–10.5)

## 2014-05-24 LAB — HEPATIC FUNCTION PANEL
ALK PHOS: 73 U/L (ref 39–117)
ALT: 38 U/L — ABNORMAL HIGH (ref 0–35)
AST: 39 U/L — ABNORMAL HIGH (ref 0–37)
Albumin: 4.8 g/dL (ref 3.5–5.2)
BILIRUBIN TOTAL: 0.3 mg/dL (ref 0.2–1.2)
Bilirubin, Direct: 0.1 mg/dL (ref 0.0–0.3)
TOTAL PROTEIN: 7.6 g/dL (ref 6.0–8.3)

## 2014-05-24 LAB — LIPID PANEL
CHOL/HDL RATIO: 3
Cholesterol: 192 mg/dL (ref 0–200)
HDL: 61 mg/dL (ref >39.00)
LDL Cholesterol: 117 mg/dL — ABNORMAL HIGH (ref 0–99)
NonHDL: 131
Triglycerides: 72 mg/dL (ref 0.0–149.0)
VLDL: 14.4 mg/dL (ref 0.0–40.0)

## 2014-05-24 LAB — TSH: TSH: 1.34 u[IU]/mL (ref 0.35–4.50)

## 2014-05-24 NOTE — Progress Notes (Signed)
   Subjective:    Patient ID: Theresa Martin, female    DOB: [birth year], 59 y.o.   MRN: [id number]  HPI Here today for CPE.  Risk Factors: HTN- chronic problem, on Maxzide currently.  Up until 2 weeks ago, BP was excellently controlled.  Pt has had increased migraines, poor sleep, and was attempting to wean off Klonopin during this time. Hyperlipidemia- chronic problem, on Pravastatin.  Pt was told by Psych that cholesterol was climbing. Physical Activity: exercising regularly- goes to gym and does class regularly Depression: chronic problem, following w/ Psych.  On multiple medications Hearing: normal to conversational tones and whispered voice at 6 ft ADL's: independent Cognitive: normal linear thought process, memory and attention intact. Home Safety: safe at home, lives alone but good local support system Height, Weight, BMI, Visual Acuity: see vitals, vision corrected to 20/20 w/ glasses Counseling: UTD on colonoscopy, UTD on mammo/pap/DEXA. Labs Ordered: See A&P Care Plan: See A&P    Review of Systems Patient reports no vision/hearing changes, adenopathy, fever, weight change,  persistant/recurrent hoarseness, swallowing issues, chest pain, palpitations, edema, persistant/recurrent cough, hemoptysis, dyspnea (rest/exertional/paroxysmal nocturnal), gastrointestinal bleeding (melena, rectal bleeding), abdominal pain, significant heartburn, bowel changes, GU symptoms (dysuria, hematuria, incontinence), Gyn symptoms (abnormal  bleeding, pain),  syncope, focal weakness, memory loss, numbness & tingling, skin/hair/nail changes, abnormal bruising or bleeding.  Reviewed meds, allergies, problem list, and PMH in chart      Objective:   Physical Exam General Appearance:    Alert, cooperative, no distress, appears stated age  Head:    Normocephalic, without obvious abnormality, atraumatic  Eyes:    PERRL, conjunctiva/corneas clear, EOM's intact, fundi    benign, both eyes  Ears:     Normal TM's and external ear canals, both ears  Nose:   Nares normal, septum midline, mucosa normal, no drainage    or sinus tenderness  Throat:   Lips, mucosa, and tongue normal; teeth and gums normal  Neck:   Supple, symmetrical, trachea midline, no adenopathy;    Thyroid: no enlargement/tenderness/nodules  Back:     Symmetric, no curvature, ROM normal, no CVA tenderness  Lungs:     Clear to auscultation bilaterally, respirations unlabored  Chest Wall:    No tenderness or deformity   Heart:    Regular rate and rhythm, S1 and S2 normal, no murmur, rub   or gallop  Breast Exam:    Deferred to GYN  Abdomen:     Soft, non-tender, bowel sounds active all four quadrants,    no masses, no organomegaly  Genitalia:    Deferred to GYN  Rectal:    Extremities:   Extremities normal, atraumatic, no cyanosis or edema  Pulses:   2+ and symmetric all extremities  Skin:   Skin color, texture, turgor normal, no rashes or lesions  Lymph nodes:   Cervical, supraclavicular, and axillary nodes normal  Neurologic:   CNII-XII intact, normal strength, sensation and reflexes    throughout          Assessment & Plan:

## 2014-05-24 NOTE — Patient Instructions (Signed)
Follow up in 1 month to recheck BP We'll notify you of your lab results and make any changes if needed Keep up the good work on healthy diet and regular exercise- you look great! Call with any questions or concerns Happy Spring!!!

## 2014-05-24 NOTE — Progress Notes (Signed)
Pre visit review using our clinic review tool, if applicable. No additional management support is needed unless otherwise documented below in the visit note. 

## 2014-05-26 NOTE — Assessment & Plan Note (Signed)
Chronic problem.  Tolerating statin w/o difficulty.  Check labs.  Adjust meds prn  

## 2014-05-26 NOTE — Assessment & Plan Note (Signed)
Pt's PE WNL.  UTD on colonoscopy, GYN, pap/mammo.  Written screening schedule updated and given to pt.  Check labs.  Anticipatory guidance provided.

## 2014-05-26 NOTE — Assessment & Plan Note (Signed)
Chronic problem.  Pt is following w/ psychiatry and is about to start counseling.  Will continue to follow and assist as able.

## 2014-05-26 NOTE — Assessment & Plan Note (Signed)
Chronic problem.  Deteriorated today.  Pt's BP up until 2 weeks ago was excellent.  She has been attempting to change her medications for both anxiety and migraines.  Suspect that this is playing a role.  No med changes today but will follow closely.  If still elevated at time of repeat visit, will need med adjustment.  Pt expressed understanding and is in agreement w/ plan.

## 2014-05-27 ENCOUNTER — Telehealth: Payer: Self-pay | Admitting: Diagnostic Neuroimaging

## 2014-05-27 NOTE — Telephone Encounter (Signed)
Pt is calling requesting Rx for gabapentin (NEURONTIN) 100 MG tablet. She uses [facility] on [address].  She is completely out.  Please call and advise.

## 2014-05-27 NOTE — Telephone Encounter (Signed)
Patient calling needing a note stating that Dr. Leta Baptist increased her Gabapentin. Patient is aware that the physician is out of the office and will be back Thursday. She would like the note faxed to Dr. Geryl Councilman. Patient did not know the fax number.

## 2014-05-29 ENCOUNTER — Telehealth: Payer: Self-pay | Admitting: *Deleted

## 2014-05-29 DIAGNOSIS — R569 Unspecified convulsions: Secondary | ICD-10-CM

## 2014-05-29 MED ORDER — GABAPENTIN 100 MG PO CAPS: 200.0000 mg | ORAL_CAPSULE | Freq: Three times a day (TID) | ORAL | Status: DC

## 2014-06-03 ENCOUNTER — Telehealth: Payer: Self-pay | Admitting: *Deleted

## 2014-06-03 NOTE — Telephone Encounter (Signed)
pls contact patient; send my last office note to provider as necessary. -VRP

## 2014-06-03 NOTE — Telephone Encounter (Signed)
Spoke to the pt on the phone and received Dr. Lianne Moris fax number to send him the information about the gabapentin increase. Pt stated a thank you

## 2014-06-27 ENCOUNTER — Ambulatory Visit (INDEPENDENT_AMBULATORY_CARE_PROVIDER_SITE_OTHER): Payer: Medicare Other | Admitting: Family Medicine

## 2014-06-27 ENCOUNTER — Encounter: Payer: Self-pay | Admitting: Family Medicine

## 2014-06-27 VITALS — BP 140/86 | HR 68 | Temp 98.0°F | Resp 16 | Wt 118.1 lb

## 2014-06-27 DIAGNOSIS — I10 Essential (primary) hypertension: Secondary | ICD-10-CM

## 2014-06-27 NOTE — Patient Instructions (Signed)
Follow up in 3 months to recheck BP No med changes at this time but we'll continue to follow Consider asking about Trazodone for sleep Call with any questions or concerns Happy Spring!!!

## 2014-06-27 NOTE — Progress Notes (Signed)
   Subjective:    Patient ID: Theresa Martin, female    DOB: Jan 04, 1956, 59 y.o.   MRN: [id number]  HPI HTN- chronic problem, on Triamterene HCTZ.  BP was elevated at last visit at 151/79.  Better today at 140/86 but still elevated for her.  Pt's meds were recently adjusted for migraine and depression.  No CP, SOB, HAs, visual changes, edema.   Review of Systems For ROS see HPI     Objective:   Physical Exam  Constitutional: She is oriented to person, place, and time. She appears well-developed and well-nourished. No distress.  HENT:  Head: Normocephalic and atraumatic.  Eyes: Conjunctivae and EOM are normal. Pupils are equal, round, and reactive to light.  Neck: Normal range of motion. Neck supple. No thyromegaly present.  Cardiovascular: Normal rate, regular rhythm, normal heart sounds and intact distal pulses.   No murmur heard. Pulmonary/Chest: Effort normal and breath sounds normal. No respiratory distress.  Abdominal: Soft. She exhibits no distension. There is no tenderness.  Musculoskeletal: She exhibits no edema.  Lymphadenopathy:    She has no cervical adenopathy.  Neurological: She is alert and oriented to person, place, and time.  Skin: Skin is warm and dry.  Psychiatric: She has a normal mood and affect. Her behavior is normal.  Vitals reviewed.         Assessment & Plan:

## 2014-06-27 NOTE — Progress Notes (Signed)
Pre visit review using our clinic review tool, if applicable. No additional management support is needed unless otherwise documented below in the visit note. 

## 2014-06-27 NOTE — Assessment & Plan Note (Signed)
BP is better today but still not back to baseline for pt.  Asymptomatic.  No med changes at this time but will continue to follow and adjust closely.  Pt expressed understanding and is in agreement w/ plan.

## 2014-07-09 ENCOUNTER — Other Ambulatory Visit: Payer: Self-pay | Admitting: Family Medicine

## 2014-07-17 DIAGNOSIS — F331 Major depressive disorder, recurrent, moderate: Secondary | ICD-10-CM | POA: Diagnosis not present

## 2014-07-17 DIAGNOSIS — F411 Generalized anxiety disorder: Secondary | ICD-10-CM | POA: Diagnosis not present

## 2014-08-06 ENCOUNTER — Encounter: Payer: Self-pay | Admitting: Gastroenterology

## 2014-09-02 ENCOUNTER — Encounter: Payer: Self-pay | Admitting: Diagnostic Neuroimaging

## 2014-09-02 ENCOUNTER — Ambulatory Visit (INDEPENDENT_AMBULATORY_CARE_PROVIDER_SITE_OTHER): Payer: Medicare Other | Admitting: Diagnostic Neuroimaging

## 2014-09-02 VITALS — BP 127/69 | HR 58 | Ht 61.0 in | Wt 122.4 lb

## 2014-09-02 DIAGNOSIS — R569 Unspecified convulsions: Secondary | ICD-10-CM | POA: Diagnosis not present

## 2014-09-02 MED ORDER — GABAPENTIN 100 MG PO CAPS: 200.0000 mg | ORAL_CAPSULE | Freq: Three times a day (TID) | ORAL | Status: DC

## 2014-09-02 NOTE — Patient Instructions (Signed)
Continue gabapentin.

## 2014-09-02 NOTE — Progress Notes (Signed)
[facility]  PATIENT: Theresa Martin DOB: 12/30/1955  REFERRING CLINICIAN: Tabori HISTORY FROM: patient REASON FOR VISIT: follow up   HISTORICAL  CHIEF COMPLAINT:  Chief Complaint  Patient presents with  . Follow-up    tension headache    HISTORY OF PRESENT ILLNESS:   UPDATE 09/02/14: Doing well. Avg 3-6 days HA per month. Some HA are mild, some are severe. Taking gabapentin 200mg  TID. Works out every day at [employer]. Overall mood is better as well.  UPDATE 05/17/14: Since last visit, patient had some psychiatry issues, was managed by behavioral health, and her headaches significant improved. She was doing well for several years and did not follow-up in our clinic. Patient here with her father today for this visit. Patient's father notes that her headaches seem to be significantly associated with her stress levels. In the last few months her stress levels have increased significantly. Her stress levels related to her ex-husband and her daughters, and some family issues. Patient having intermittent, almost daily left-sided headaches, with numbness and tingling in the left scalp, typically in the evening when she is home. During the daytime patient stays active, works out several times a week, and does better. Patient had been on gabapentin 100 mg at bedtime for several years. Her psychiatrist increased this to 2 and then 3 capsules at bedtime. When she did 3 capsules at bedtime, her headache type symptoms paradoxical he worsened. She then went back to taking one capsule at bedtime. Strangely, she reports that she was told she was able to take this medication (gabapentin) as needed as well and for the last few weeks has been taking one capsule at bedtime, followed by 1-2 capsules every hour, throughout the night, taking up to 10-14 capsules in a 12 hour period.   PRIOR HPI (11/04/08 - 06/03/09, VRP): 59 year old right-handed female with history of high blood pressure,  depression and anxiety presenting for evaluation of chronic headaches and abnormal MRI scan.  On October 26, 2008 the patient presented to the emergency room for right-sided chest pain and was diagnosed with pneumonia.   At this time her headaches worsened in severity.  Upon review of prior MRIs demonstrating microvascular gliosis, patient was referred to our neurology clinic for futher evaluation. Patient's headaches began in 2005 consisting of a "dull pressure like sensation" over the top of her head. Occasionally these are associated with mild nausea without vomiting as well as intermittent left facial numbness.  Patient had dull nagging low level headaches on a daily basis with daily flareups involving a hot sensation over her scalp.  Sometimes this sensation starts as posterior neck pain that moves up the back of her head. She was initially evaluated with MRI scans in 2005 and 2006 and started on topiramate 100 mg at bedtime.   In addition she was taking Advil and Tylenol over-the-counter almost a daily basis.   During flareups the patient would have to lay down in place ice packs over her eyes and the top of her head.   Patient's headaches are aggravated by lack of sleep or stress. No food triggers noted.  Her other physicians decided to taper her off of NSAIDs, tylenol and also her topiramate. Patient had MRI of the head and neck, found to have some nonspecific white matter lesions. Patient had lumbar puncture and additional blood testing, but multiple sclerosis was ruled out. Patient was treated with several occipital nerve blocks with good results.   REVIEW OF SYSTEMS: Full 14  system review of systems performed and notable only for depression anxiety restless legs headache light sens palpitations.   ALLERGIES: No Known Allergies  HOME MEDICATIONS: Outpatient Prescriptions Prior to Visit  Medication Sig Dispense Refill  . clonazePAM (KLONOPIN) 0.5 MG tablet Take 0.5 tablets by mouth at bedtime.   2    . escitalopram (LEXAPRO) 10 MG tablet Take 15 mg by mouth daily. TAKE ONE AND HALF TABS DAILY    . fluocinonide ointment (LIDEX) 0.05 % as needed.   2  . folic acid (FOLVITE) 1 MG tablet Take 1 mg by mouth daily.    Marland Kitchen gabapentin (NEURONTIN) 100 MG capsule Take 2 capsules (200 mg total) by mouth 3 (three) times daily. 180 capsule 2  . Glucosamine-Chondroitin (MOVE FREE PO) Take 1 tablet by mouth daily.    Marland Kitchen lamoTRIgine (LAMICTAL) 100 MG tablet Take 100 mg by mouth daily. TAKE ONE TABLET QAM AND ONE TABLETS AT BEDTIME    . pravastatin (PRAVACHOL) 20 MG tablet TAKE 1 TABLET BY MOUTH DAILY. 30 tablet 12  . QUEtiapine (SEROQUEL XR) 300 MG 24 hr tablet Take 300 mg by mouth 2 (two) times daily.      Marland Kitchen triamterene-hydrochlorothiazide (MAXZIDE-25) 37.5-25 MG per tablet TAKE 1 TABLET BY MOUTH DAILY. 30 tablet 2  . valACYclovir (VALTREX) 1000 MG tablet Take 1,000 mg by mouth 2 (two) times daily.    . Calcium Carbonate (CALCIUM 600 PO) Take 2 tablets by mouth daily. Has calcium 500 mg, vit D3 1000 U and vit K 40 mg     No facility-administered medications prior to visit.    PAST MEDICAL HISTORY: Past Medical History  Diagnosis Date  . Anxiety   . Depression   . Hypertension   . Allergy   . Persistent headaches   . Kidney stones   . Pneumonia   . Hyperlipidemia   . Shingles outbreak 02/2014    PAST SURGICAL HISTORY: Past Surgical History  Procedure Laterality Date  . Cesarean section  1987, 1989  . Exploratory laparotomy      laproscopy for infertility  . Cardiovascular stress test  02/2009    treadmill stress test: Low risk    FAMILY HISTORY: Family History  Problem Relation Age of Onset  . Hypertension Mother   . Coronary artery disease Mother   . Heart failure Mother   . Hypertension Father   . Liver disease Sister     liver failure  . Arrhythmia Brother   . Hypertension Brother   . Cancer Neg Hx     colon     SOCIAL HISTORY:  History   Social History  . Marital  Status: Legally Separated    Spouse Name: N/A  . Number of Children: 2  . Years of Education: N/A   Occupational History  . Teacher assistant    Social History Main Topics  . Smoking status: Never Smoker   . Smokeless tobacco: Not on file  . Alcohol Use: No     Comment: rare  . Drug Use: No  . Sexual Activity: Not on file   Other Topics Concern  . Not on file   Social History Narrative   Regular exercise- yes, daily running, elliptical    Diet: fruits and veggies, water    No caffeine     PHYSICAL EXAM  Filed Vitals:   09/02/14 1009  BP: 127/69  Pulse: 58  Height: 5\' 1"  (1.549 m)  Weight: 122 lb 6.4 oz (55.52 kg)  Body mass index is 23.14 kg/(m^2).  No exam data present  No flowsheet data found.  GENERAL EXAM: Patient is in no distress; well developed, nourished and groomed; neck is supple  CARDIOVASCULAR: Regular rate and rhythm, no murmurs, no carotid bruits  NEUROLOGIC: MENTAL STATUS: awake, alert, language fluent, comprehension intact, naming intact, fund of knowledge appropriate CRANIAL NERVE: no papilledema on fundoscopic exam, pupils equal and reactive to light, visual fields full to confrontation, extraocular muscles intact, no nystagmus, facial sensation and strength symmetric, hearing intact, palate elevates symmetrically, uvula midline, shoulder shrug symmetric, tongue midline. MOTOR: normal bulk and tone, full strength in the BUE, BLE SENSORY: normal and symmetric to light touch, temperature, vibration COORDINATION: finger-nose-finger, fine finger movements normal REFLEXES: deep tendon reflexes present and symmetric GAIT/STATION: narrow based gait; able to walk on toes, heels and tandem; romberg is negative    DIAGNOSTIC DATA (LABS, IMAGING, TESTING) - I reviewed patient records, labs, notes, testing and imaging myself where available.  Lab Results  Component Value Date   WBC 3.2* 05/24/2014   HGB 13.4 05/24/2014   HCT 39.3 05/24/2014    MCV 91.3 05/24/2014   PLT 211.0 05/24/2014      Component Value Date/Time   NA 140 05/24/2014 1157   K 4.7 05/24/2014 1157   CL 102 05/24/2014 1157   CO2 34* 05/24/2014 1157   GLUCOSE 105* 05/24/2014 1157   BUN 14 05/24/2014 1157   CREATININE 0.79 05/24/2014 1157   CALCIUM 10.0 05/24/2014 1157   PROT 7.6 05/24/2014 1157   ALBUMIN 4.8 05/24/2014 1157   AST 39* 05/24/2014 1157   ALT 38* 05/24/2014 1157   ALKPHOS 73 05/24/2014 1157   BILITOT 0.3 05/24/2014 1157   GFRNONAA 80* 05/07/2013 1525   GFRAA >90 05/07/2013 1525   Lab Results  Component Value Date   CHOL 192 05/24/2014   HDL 61.00 05/24/2014   LDLCALC 117* 05/24/2014   TRIG 72.0 05/24/2014   CHOLHDL 3 05/24/2014   Lab Results  Component Value Date   HGBA1C 5.7 10/18/2011   Lab Results  Component Value Date   VITAMINB12 782 01/16/2010   Lab Results  Component Value Date   TSH 1.34 05/24/2014     11/13/08 LP - opening pressure 7cm H2O; WBC 1, RBC 0, glucose 62, protein 26, OCB (2 is CSF, not seen in serum), IgG index 0.5 (normal), lyme PCR neg, EBV PCR neg  11/13/08 VEP - normal  11/08/08 MRI cervical - normal  11/08/08 MRI brain (with and without contrast) - multiple supratentorial, periventricular and juxtacortical white matter lesions which may represent chronic demyelinating plaques or perivascular gliosis.  No abnormal enhancement on postcontrast views.     ASSESSMENT AND PLAN  59 y.o. year old female here with mixed tension and migraine headaches, left occipital neuralgia, and longer standing significant depression/anxiety. Current headaches have occipital neuralgia type features. Symptoms seem to be worse with stress and external factors. Overall doing well on regular gabapentin.   PLAN: - continue gabapentin 200 mg 3 times per day - continue psychiatry evaluations  Return in about 6 months (around 03/04/2015).  I spent 15 minutes of face to face time with patient. Greater than 50% of time was  spent in counseling and coordination of care with patient.     Penni Bombard, MD 0/03/270, 53:66 AM Certified in Neurology, Neurophysiology and Neuroimaging  [facility] [address] [place], [place] [address] [phone]

## 2014-09-17 ENCOUNTER — Other Ambulatory Visit: Payer: Self-pay | Admitting: Diagnostic Neuroimaging

## 2014-09-17 DIAGNOSIS — F411 Generalized anxiety disorder: Secondary | ICD-10-CM | POA: Diagnosis not present

## 2014-09-17 DIAGNOSIS — F331 Major depressive disorder, recurrent, moderate: Secondary | ICD-10-CM | POA: Diagnosis not present

## 2014-09-26 ENCOUNTER — Ambulatory Visit (INDEPENDENT_AMBULATORY_CARE_PROVIDER_SITE_OTHER): Payer: Medicare Other | Admitting: Family Medicine

## 2014-09-26 ENCOUNTER — Encounter: Payer: Self-pay | Admitting: Family Medicine

## 2014-09-26 VITALS — BP 122/80 | HR 80 | Temp 97.9°F | Resp 16 | Wt 124.0 lb

## 2014-09-26 DIAGNOSIS — Z79899 Other long term (current) drug therapy: Secondary | ICD-10-CM | POA: Diagnosis not present

## 2014-09-26 DIAGNOSIS — E785 Hyperlipidemia, unspecified: Secondary | ICD-10-CM

## 2014-09-26 DIAGNOSIS — I10 Essential (primary) hypertension: Secondary | ICD-10-CM

## 2014-09-26 LAB — CBC WITH DIFFERENTIAL/PLATELET
Basophils Absolute: 0 10*3/uL (ref 0.0–0.1)
Basophils Relative: 0.2 % (ref 0.0–3.0)
Eosinophils Absolute: 0 10*3/uL (ref 0.0–0.7)
Eosinophils Relative: 0.8 % (ref 0.0–5.0)
HCT: 39.4 % (ref 36.0–46.0)
HEMOGLOBIN: 13.2 g/dL (ref 12.0–15.0)
LYMPHS PCT: 31.4 % (ref 12.0–46.0)
Lymphs Abs: 1.3 10*3/uL (ref 0.7–4.0)
MCHC: 33.5 g/dL (ref 30.0–36.0)
MCV: 92.7 fl (ref 78.0–100.0)
Monocytes Absolute: 0.3 10*3/uL (ref 0.1–1.0)
Monocytes Relative: 8.3 % (ref 3.0–12.0)
NEUTROS ABS: 2.4 10*3/uL (ref 1.4–7.7)
NEUTROS PCT: 59.3 % (ref 43.0–77.0)
Platelets: 223 10*3/uL (ref 150.0–400.0)
RBC: 4.25 Mil/uL (ref 3.87–5.11)
RDW: 12.4 % (ref 11.5–15.5)
WBC: 4.1 10*3/uL (ref 4.0–10.5)

## 2014-09-26 LAB — LIPID PANEL
CHOL/HDL RATIO: 4
Cholesterol: 211 mg/dL — ABNORMAL HIGH (ref 0–200)
HDL: 53.4 mg/dL (ref >39.00)
LDL Cholesterol: 133 mg/dL — ABNORMAL HIGH (ref 0–99)
NONHDL: 157.6
TRIGLYCERIDES: 124 mg/dL (ref 0.0–149.0)
VLDL: 24.8 mg/dL (ref 0.0–40.0)

## 2014-09-26 LAB — TSH: TSH: 1.91 u[IU]/mL (ref 0.35–4.50)

## 2014-09-26 LAB — HEPATIC FUNCTION PANEL
ALT: 34 U/L (ref 0–35)
AST: 32 U/L (ref 0–37)
Albumin: 4.4 g/dL (ref 3.5–5.2)
Alkaline Phosphatase: 75 U/L (ref 39–117)
BILIRUBIN TOTAL: 0.3 mg/dL (ref 0.2–1.2)
Bilirubin, Direct: 0.1 mg/dL (ref 0.0–0.3)
Total Protein: 7.4 g/dL (ref 6.0–8.3)

## 2014-09-26 LAB — BASIC METABOLIC PANEL
BUN: 12 mg/dL (ref 6–23)
CHLORIDE: 102 meq/L (ref 96–112)
CO2: 35 meq/L — AB (ref 19–32)
CREATININE: 0.82 mg/dL (ref 0.40–1.20)
Calcium: 9.5 mg/dL (ref 8.4–10.5)
GFR: 75.65 mL/min (ref >60.00)
Glucose, Bld: 90 mg/dL (ref 70–99)
POTASSIUM: 3.9 meq/L (ref 3.5–5.1)
Sodium: 141 mEq/L (ref 135–145)

## 2014-09-26 MED ORDER — PRAVASTATIN SODIUM 20 MG PO TABS: 20.0000 mg | ORAL_TABLET | Freq: Every day | ORAL | Status: DC

## 2014-09-26 MED ORDER — TRIAMTERENE-HCTZ 37.5-25 MG PO TABS: 1.0000 | ORAL_TABLET | Freq: Every day | ORAL | Status: DC

## 2014-09-26 MED ORDER — ZOSTER VACCINE LIVE 19400 UNT/0.65ML ~~LOC~~ SOLR: 0.6500 mL | Freq: Once | SUBCUTANEOUS | Status: DC

## 2014-09-26 NOTE — Progress Notes (Signed)
   Subjective:    Patient ID: Theresa Martin, female    DOB: Oct 23, 1955, 59 y.o.   MRN: [id number]  HPI HTN- chronic problem, on Triamterene HCTZ daily.  No CP, SOB, HAs, visual changes, edema.  Has gained 6 lbs since March.  Exercising regularly  Hyperlipidemia- chronic problem, on Pravastatin.  No abd pain, N/V.    High risk medication- pt's psychiatrist is asking for EKG to r/o prolonged QT interval due to Lamictal use.   Review of Systems For ROS see HPI     Objective:   Physical Exam  Constitutional: She is oriented to person, place, and time. She appears well-developed and well-nourished. No distress.  HENT:  Head: Normocephalic and atraumatic.  Eyes: Conjunctivae and EOM are normal. Pupils are equal, round, and reactive to light.  Neck: Normal range of motion. Neck supple. No thyromegaly present.  Cardiovascular: Normal rate, regular rhythm, normal heart sounds and intact distal pulses.   No murmur heard. Pulmonary/Chest: Effort normal and breath sounds normal. No respiratory distress.  Abdominal: Soft. She exhibits no distension. There is no tenderness.  Musculoskeletal: She exhibits no edema.  Lymphadenopathy:    She has no cervical adenopathy.  Neurological: She is alert and oriented to person, place, and time.  Skin: Skin is warm and dry.  Psychiatric: She has a normal mood and affect. Her behavior is normal.  Vitals reviewed.         Assessment & Plan:

## 2014-09-26 NOTE — Assessment & Plan Note (Signed)
Chronic problem.  Tolerating statin w/o difficulty.  Exercising regularly.  Check labs.  Adjust meds prn  

## 2014-09-26 NOTE — Assessment & Plan Note (Signed)
New to provider, ongoing for pt.  On multiple psych meds.  Provider wants EKG done to r/o prolonged QT.  EKG WNL.  Will fax to requesting MD.

## 2014-09-26 NOTE — Progress Notes (Signed)
Pre visit review using our clinic review tool, if applicable. No additional management support is needed unless otherwise documented below in the visit note. 

## 2014-09-26 NOTE — Patient Instructions (Signed)
Schedule your complete physical for after 2/26 We'll notify you of your lab results and make any changes if needed Keep up the good work on healthy diet and regular exercise I sent the prescription for the shingles shot to the pharmacy Call with any questions or concerns Happy 4th of July!!

## 2014-09-26 NOTE — Assessment & Plan Note (Signed)
Chronic problem.  Adequate control.  Asymptomatic.  Check labs.  No anticipated med changes 

## 2014-09-27 ENCOUNTER — Other Ambulatory Visit: Payer: Self-pay | Admitting: General Practice

## 2014-09-27 LAB — HM MAMMOGRAPHY: HM MAMMO: NORMAL

## 2014-09-27 LAB — HM PAP SMEAR: HM PAP: NORMAL

## 2014-09-27 MED ORDER — ATORVASTATIN CALCIUM 20 MG PO TABS: 20.0000 mg | ORAL_TABLET | Freq: Every day | ORAL | Status: DC

## 2014-10-01 ENCOUNTER — Telehealth: Payer: Self-pay | Admitting: Family Medicine

## 2014-10-01 NOTE — Telephone Encounter (Signed)
Pt notified that due to poor cholesterol her medications were changed.

## 2014-10-01 NOTE — Telephone Encounter (Signed)
Relation to pt: self Call back number: [phone]   Reason for call:  Pt in need of clarification of why pravastatin (PRAVACHOL) was changed to atorvastatin (LIPITOR) . Please advise

## 2014-10-02 ENCOUNTER — Other Ambulatory Visit: Payer: Self-pay | Admitting: Family Medicine

## 2014-10-02 NOTE — Telephone Encounter (Signed)
Med filled.  

## 2014-10-22 ENCOUNTER — Other Ambulatory Visit: Payer: Self-pay | Admitting: Family Medicine

## 2014-10-24 DIAGNOSIS — Z1283 Encounter for screening for malignant neoplasm of skin: Secondary | ICD-10-CM | POA: Diagnosis not present

## 2014-10-24 DIAGNOSIS — L728 Other follicular cysts of the skin and subcutaneous tissue: Secondary | ICD-10-CM | POA: Diagnosis not present

## 2014-10-24 DIAGNOSIS — L814 Other melanin hyperpigmentation: Secondary | ICD-10-CM | POA: Diagnosis not present

## 2014-10-24 DIAGNOSIS — D485 Neoplasm of uncertain behavior of skin: Secondary | ICD-10-CM | POA: Diagnosis not present

## 2014-10-24 DIAGNOSIS — B001 Herpesviral vesicular dermatitis: Secondary | ICD-10-CM | POA: Diagnosis not present

## 2014-10-24 DIAGNOSIS — B078 Other viral warts: Secondary | ICD-10-CM | POA: Diagnosis not present

## 2014-10-31 DIAGNOSIS — Z1231 Encounter for screening mammogram for malignant neoplasm of breast: Secondary | ICD-10-CM | POA: Diagnosis not present

## 2014-11-04 DIAGNOSIS — D485 Neoplasm of uncertain behavior of skin: Secondary | ICD-10-CM | POA: Diagnosis not present

## 2014-11-04 DIAGNOSIS — Z789 Other specified health status: Secondary | ICD-10-CM | POA: Diagnosis not present

## 2014-11-07 ENCOUNTER — Ambulatory Visit: Payer: BC Managed Care – PPO | Admitting: Family Medicine

## 2014-11-11 DIAGNOSIS — F3130 Bipolar disorder, current episode depressed, mild or moderate severity, unspecified: Secondary | ICD-10-CM | POA: Diagnosis not present

## 2014-11-18 DIAGNOSIS — D485 Neoplasm of uncertain behavior of skin: Secondary | ICD-10-CM | POA: Diagnosis not present

## 2014-12-10 DIAGNOSIS — R8299 Other abnormal findings in urine: Secondary | ICD-10-CM | POA: Diagnosis not present

## 2014-12-18 DIAGNOSIS — M25561 Pain in right knee: Secondary | ICD-10-CM | POA: Diagnosis not present

## 2014-12-18 DIAGNOSIS — M170 Bilateral primary osteoarthritis of knee: Secondary | ICD-10-CM | POA: Diagnosis not present

## 2014-12-18 DIAGNOSIS — M25562 Pain in left knee: Secondary | ICD-10-CM | POA: Diagnosis not present

## 2014-12-25 DIAGNOSIS — M25562 Pain in left knee: Secondary | ICD-10-CM | POA: Diagnosis not present

## 2014-12-25 DIAGNOSIS — M1712 Unilateral primary osteoarthritis, left knee: Secondary | ICD-10-CM | POA: Diagnosis not present

## 2014-12-27 DIAGNOSIS — M25561 Pain in right knee: Secondary | ICD-10-CM | POA: Diagnosis not present

## 2014-12-27 DIAGNOSIS — M25562 Pain in left knee: Secondary | ICD-10-CM | POA: Diagnosis not present

## 2014-12-27 DIAGNOSIS — M170 Bilateral primary osteoarthritis of knee: Secondary | ICD-10-CM | POA: Diagnosis not present

## 2014-12-27 DIAGNOSIS — M1711 Unilateral primary osteoarthritis, right knee: Secondary | ICD-10-CM | POA: Diagnosis not present

## 2014-12-30 DIAGNOSIS — M25562 Pain in left knee: Secondary | ICD-10-CM | POA: Diagnosis not present

## 2014-12-30 DIAGNOSIS — M1712 Unilateral primary osteoarthritis, left knee: Secondary | ICD-10-CM | POA: Diagnosis not present

## 2014-12-30 DIAGNOSIS — M25561 Pain in right knee: Secondary | ICD-10-CM | POA: Diagnosis not present

## 2014-12-30 DIAGNOSIS — M170 Bilateral primary osteoarthritis of knee: Secondary | ICD-10-CM | POA: Diagnosis not present

## 2015-01-03 DIAGNOSIS — M25562 Pain in left knee: Secondary | ICD-10-CM | POA: Diagnosis not present

## 2015-01-03 DIAGNOSIS — M25561 Pain in right knee: Secondary | ICD-10-CM | POA: Diagnosis not present

## 2015-01-03 DIAGNOSIS — M170 Bilateral primary osteoarthritis of knee: Secondary | ICD-10-CM | POA: Diagnosis not present

## 2015-01-03 DIAGNOSIS — M1711 Unilateral primary osteoarthritis, right knee: Secondary | ICD-10-CM | POA: Diagnosis not present

## 2015-01-06 ENCOUNTER — Telehealth: Payer: Self-pay | Admitting: Family Medicine

## 2015-01-06 DIAGNOSIS — F331 Major depressive disorder, recurrent, moderate: Secondary | ICD-10-CM | POA: Diagnosis not present

## 2015-01-06 DIAGNOSIS — M25561 Pain in right knee: Secondary | ICD-10-CM | POA: Diagnosis not present

## 2015-01-06 DIAGNOSIS — M1712 Unilateral primary osteoarthritis, left knee: Secondary | ICD-10-CM | POA: Diagnosis not present

## 2015-01-06 DIAGNOSIS — M25562 Pain in left knee: Secondary | ICD-10-CM | POA: Diagnosis not present

## 2015-01-06 DIAGNOSIS — M170 Bilateral primary osteoarthritis of knee: Secondary | ICD-10-CM | POA: Diagnosis not present

## 2015-01-06 NOTE — Telephone Encounter (Signed)
Noted. Message routed to provider for FYI.

## 2015-01-06 NOTE — Telephone Encounter (Signed)
[facility] - Client TELEPHONE [facility]     Patient Name: [facility] Initial Comment Caller states, last Thus, she punctured her finger with the metal part of a spark plug, does the need Tetanus shot ?   DOB: 20-Jul-1955      Nurse Assessment  Nurse: Julien Girt RN, Almyra Free Date/Time Eilene Ghazi Time): 01/06/2015 2:04:29 PM  Confirm and document reason for call. If symptomatic, describe symptoms. ---Caller states last Thursday she punctured her finger with the tiny metal part of a spark plug, and is wondering if she needs a Tetanus shot . Declines triage, no sx at this time.  Has the patient traveled out of the country within the last 30 days? ---Not Applicable  Does the patient have any new or worsening symptoms? ---No  Please document clinical information provided and list any resource used. ---Ortencia Kick, per EMR/EPIC she had a Tdap on 07/27/2012 , so she is up to date . She verbalized understanding and will cb as needed.    Guidelines     Guideline Title Affirmed Question Affirmed Notes        Final Disposition User   Clinical Call Julien Girt, RN, Almyra Free

## 2015-01-07 ENCOUNTER — Ambulatory Visit: Payer: Medicare Other | Admitting: Family Medicine

## 2015-01-09 ENCOUNTER — Ambulatory Visit: Payer: BC Managed Care – PPO

## 2015-01-09 ENCOUNTER — Telehealth: Payer: Self-pay | Admitting: Family Medicine

## 2015-01-09 NOTE — Telephone Encounter (Signed)
°  Relation to QT:TCNG Call back [place]: [id number] - [place], [place] - [place] [phone] (Phone) [phone] (Fax)          Reason for call:  Patient would like her flu and pneumonia injection at [facility] please advise

## 2015-01-09 NOTE — Telephone Encounter (Signed)
Pt informed that it is ok to to have both vaccinations. Since she is medicare i informed her she may want to inquire about receiving the prevnar this year and the pneumovax next year.

## 2015-01-10 DIAGNOSIS — M25561 Pain in right knee: Secondary | ICD-10-CM | POA: Diagnosis not present

## 2015-01-10 DIAGNOSIS — M170 Bilateral primary osteoarthritis of knee: Secondary | ICD-10-CM | POA: Diagnosis not present

## 2015-01-10 DIAGNOSIS — M25562 Pain in left knee: Secondary | ICD-10-CM | POA: Diagnosis not present

## 2015-01-10 DIAGNOSIS — M1711 Unilateral primary osteoarthritis, right knee: Secondary | ICD-10-CM | POA: Diagnosis not present

## 2015-01-13 ENCOUNTER — Encounter: Payer: Self-pay | Admitting: Diagnostic Neuroimaging

## 2015-01-13 ENCOUNTER — Telehealth: Payer: Self-pay | Admitting: Family Medicine

## 2015-01-13 ENCOUNTER — Ambulatory Visit (INDEPENDENT_AMBULATORY_CARE_PROVIDER_SITE_OTHER): Payer: Medicare Other | Admitting: Diagnostic Neuroimaging

## 2015-01-13 VITALS — BP 134/76 | HR 73 | Ht 61.0 in | Wt 126.0 lb

## 2015-01-13 DIAGNOSIS — G43009 Migraine without aura, not intractable, without status migrainosus: Secondary | ICD-10-CM | POA: Diagnosis not present

## 2015-01-13 DIAGNOSIS — M1712 Unilateral primary osteoarthritis, left knee: Secondary | ICD-10-CM | POA: Diagnosis not present

## 2015-01-13 DIAGNOSIS — M25562 Pain in left knee: Secondary | ICD-10-CM | POA: Diagnosis not present

## 2015-01-13 MED ORDER — PROPRANOLOL HCL 40 MG PO TABS: 40.0000 mg | ORAL_TABLET | Freq: Two times a day (BID) | ORAL | Status: DC

## 2015-01-13 NOTE — Progress Notes (Signed)
[facility]  PATIENT: Theresa Martin DOB: [birth year]  REFERRING CLINICIAN: Tabori HISTORY FROM: patient REASON FOR VISIT: follow up   HISTORICAL  CHIEF COMPLAINT:  Chief Complaint  Patient presents with  . Seizures    rm 6  . Headache  . Follow-up    4 month    HISTORY OF PRESENT ILLNESS:   UPDATE 01/13/15: Since last visit, having more headaches, and more fluid retention. Now being tapered off gabapentin by psychiatry due to side effects.   UPDATE 09/02/14: Doing well. Avg 3-6 days HA per month. Some HA are mild, some are severe. Taking gabapentin 257m TID. Works out every day at [employer] Overall mood is better as well.  UPDATE 05/17/14: Since last visit, patient had some psychiatry issues, was managed by behavioral health, and her headaches significant improved. She was doing well for several years and did not follow-up in our clinic. Patient here with her father today for this visit. Patient's father notes that her headaches seem to be significantly associated with her stress levels. In the last few months her stress levels have increased significantly. Her stress levels related to her ex-husband and her daughters, and some family issues. Patient having intermittent, almost daily left-sided headaches, with numbness and tingling in the left scalp, typically in the evening when she is home. During the daytime patient stays active, works out several times a week, and does better. Patient had been on gabapentin 100 mg at bedtime for several years. Her psychiatrist increased this to 2 and then 3 capsules at bedtime. When she did 3 capsules at bedtime, her headache type symptoms paradoxical he worsened. She then went back to taking one capsule at bedtime. Strangely, she reports that she was told she was able to take this medication (gabapentin) as needed as well and for the last few weeks has been taking one capsule at bedtime, followed by 1-2 capsules every hour,  throughout the night, taking up to 10-14 capsules in a 12 hour period.   PRIOR HPI (11/04/08 - 06/03/09, VRP): 59year old right-handed female with history of high blood pressure, depression and anxiety presenting for evaluation of chronic headaches and abnormal MRI scan.  On October 26, 2008 the patient presented to the emergency room for right-sided chest pain and was diagnosed with pneumonia.   At this time her headaches worsened in severity.  Upon review of prior MRIs demonstrating microvascular gliosis, patient was referred to our neurology clinic for futher evaluation. Patient's headaches began in 2005 consisting of a "dull pressure like sensation" over the top of her head. Occasionally these are associated with mild nausea without vomiting as well as intermittent left facial numbness.  Patient had dull nagging low level headaches on a daily basis with daily flareups involving a hot sensation over her scalp.  Sometimes this sensation starts as posterior neck pain that moves up the back of her head. She was initially evaluated with MRI scans in 2005 and 2006 and started on topiramate 100 mg at bedtime.   In addition she was taking Advil and Tylenol over-the-counter almost a daily basis.   During flareups the patient would have to lay down in place ice packs over her eyes and the top of her head.   Patient's headaches are aggravated by lack of sleep or stress. No food triggers noted.  Her other physicians decided to taper her off of NSAIDs, tylenol and also her topiramate. Patient had MRI of the head and neck, found to have some  nonspecific white matter lesions. Patient had lumbar puncture and additional blood testing, but multiple sclerosis was ruled out. Patient was treated with several occipital nerve blocks with good results.   REVIEW OF SYSTEMS: Full 14 system review of systems performed and notable only for depression anxiety restless legs headache light sens palpitations.   ALLERGIES: No Known  Allergies  HOME MEDICATIONS: Outpatient Prescriptions Prior to Visit  Medication Sig Dispense Refill  . atorvastatin (LIPITOR) 20 MG tablet Take 1 tablet (20 mg total) by mouth daily. 30 tablet 6  . Calcium-Vitamin D-Vitamin K (CALCIUM + D + K PO) Take 2 each by mouth daily. Calcium 538m- Vitamin D 1000iu- K 444m   . clonazePAM (KLONOPIN) 0.5 MG tablet Take 0.25 mg by mouth at bedtime.   2  . escitalopram (LEXAPRO) 10 MG tablet Take 15 mg by mouth daily. TAKE ONE AND HALF TABS DAILY    . fluocinonide ointment (LIDEX) 0.05 % as needed.   2  . folic acid (FOLVITE) 1 MG tablet Take 1 mg by mouth daily.    . Marland Kitchenabapentin (NEURONTIN) 100 MG capsule Take 2 capsules (200 mg total) by mouth 3 (three) times daily. (Patient taking differently: Take 200 mg by mouth. 2 tablets at 9am, 2 tablets at lunch, 2 tablets at 9pm) 540 capsule 4  . Glucosamine-Chondroitin (MOVE FREE PO) Take 1 tablet by mouth daily.    . Marland Kitchenetoconazole-Hydrocortisone 2 & 1 % KIT Apply topically as needed. Cracks on mouth    . lamoTRIgine (LAMICTAL) 100 MG tablet Take 100 mg by mouth daily. TAKE ONE TABLET QAM AND ONE TABLETS AT BEDTIME    . Melatonin 3 MG CAPS Take 1 capsule by mouth at bedtime.    . Marland KitchenUEtiapine (SEROQUEL XR) 300 MG 24 hr tablet Take 300 mg by mouth 2 (two) times daily.      . Marland Kitchenriamterene-hydrochlorothiazide (MAXZIDE-25) 37.5-25 MG per tablet TAKE 1 TABLET BY MOUTH DAILY. 30 tablet 6  . valACYclovir (VALTREX) 1000 MG tablet Take 1,000 mg by mouth 2 (two) times daily as needed.     . zoster vaccine live, PF, (ZOSTAVAX) 1960630NT/0.65ML injection Inject 19,400 Units into the skin once. 1 each 0   No facility-administered medications prior to visit.    PAST MEDICAL HISTORY: Past Medical History  Diagnosis Date  . Anxiety   . Depression   . Hypertension   . Allergy   . Persistent headaches   . Kidney stones   . Pneumonia   . Hyperlipidemia   . Shingles outbreak 02/2014    PAST SURGICAL HISTORY: Past  Surgical History  Procedure Laterality Date  . Cesarean section  1987, 1989  . Exploratory laparotomy      laproscopy for infertility  . Cardiovascular stress test  02/2009    treadmill stress test: Low risk    FAMILY HISTORY: Family History  Problem Relation Age of Onset  . Hypertension Mother   . Coronary artery disease Mother   . Heart failure Mother   . Hypertension Father   . Liver disease Sister     liver failure  . Arrhythmia Brother   . Hypertension Brother   . Cancer Neg Hx     colon     SOCIAL HISTORY:  Social History   Social History  . Marital Status: Legally Separated    Spouse Name: N/A  . Number of Children: 2  . Years of Education: N/A   Occupational History  . [employer] engineer  Social  History Main Topics  . Smoking status: Never Smoker   . Smokeless tobacco: Not on file  . Alcohol Use: No     Comment: rare  . Drug Use: No  . Sexual Activity: Not on file   Other Topics Concern  . Not on file   Social History Narrative   Regular exercise- yes, daily running, elliptical    Diet: fruits and veggies, water    No caffeine     PHYSICAL EXAM  Filed Vitals:   01/13/15 1318  BP: 134/76  Pulse: 73  Height: 5' 1"  (1.549 m)  Weight: 126 lb (57.153 kg)    Body mass index is 23.82 kg/(m^2).  No exam data present  No flowsheet data found.  GENERAL EXAM: Patient is in no distress; well developed, nourished and groomed; neck is supple  CARDIOVASCULAR: Regular rate and rhythm, no murmurs, no carotid bruits  NEUROLOGIC: MENTAL STATUS: awake, alert, language fluent, comprehension intact, naming intact, fund of knowledge appropriate CRANIAL NERVE: pupils equal and reactive to light, visual fields full to confrontation, extraocular muscles intact, no nystagmus, facial sensation and strength symmetric, hearing intact, palate elevates symmetrically, uvula midline, shoulder shrug symmetric, tongue midline. MOTOR: normal bulk and tone, full  strength in the BUE, BLE SENSORY: normal and symmetric to light touch, temperature, vibration COORDINATION: finger-nose-finger, fine finger movements normal REFLEXES: deep tendon reflexes present and symmetric GAIT/STATION: narrow based gait; able to walk on toes, heels and tandem; romberg is negative    DIAGNOSTIC DATA (LABS, IMAGING, TESTING) - I reviewed patient records, labs, notes, testing and imaging myself where available.  Lab Results  Component Value Date   WBC 4.1 09/26/2014   HGB 13.2 09/26/2014   HCT 39.4 09/26/2014   MCV 92.7 09/26/2014   PLT 223.0 09/26/2014      Component Value Date/Time   NA 141 09/26/2014 1135   K 3.9 09/26/2014 1135   CL 102 09/26/2014 1135   CO2 35* 09/26/2014 1135   GLUCOSE 90 09/26/2014 1135   BUN 12 09/26/2014 1135   CREATININE 0.82 09/26/2014 1135   CALCIUM 9.5 09/26/2014 1135   PROT 7.4 09/26/2014 1135   ALBUMIN 4.4 09/26/2014 1135   AST 32 09/26/2014 1135   ALT 34 09/26/2014 1135   ALKPHOS 75 09/26/2014 1135   BILITOT 0.3 09/26/2014 1135   GFRNONAA 80* 05/07/2013 1525   GFRAA >90 05/07/2013 1525   Lab Results  Component Value Date   CHOL 211* 09/26/2014   HDL 53.40 09/26/2014   LDLCALC 133* 09/26/2014   TRIG 124.0 09/26/2014   CHOLHDL 4 09/26/2014   Lab Results  Component Value Date   HGBA1C 5.7 10/18/2011   Lab Results  Component Value Date   VITAMINB12 782 01/16/2010   Lab Results  Component Value Date   TSH 1.91 09/26/2014     11/13/08 LP - opening pressure 7cm H2O; WBC 1, RBC 0, glucose 62, protein 26, OCB (2 is CSF, not seen in serum), IgG index 0.5 (normal), lyme PCR neg, EBV PCR neg  11/13/08 VEP - normal  11/08/08 MRI cervical - normal  11/08/08 MRI brain (with and without contrast) - multiple supratentorial, periventricular and juxtacortical white matter lesions which may represent chronic demyelinating plaques or perivascular gliosis.  No abnormal enhancement on postcontrast views.     ASSESSMENT  AND PLAN  59 y.o. year old female here with mixed tension and migraine headaches, left occipital neuralgia, and longer standing significant depression/anxiety. Current headaches have occipital neuralgia type features. Symptoms  seem to be worse with stress and external factors. Overall was doing well on regular gabapentin, now with more headaches (4-10 per month over last 3 month; episodic migraine type). Will start propranolol.   PLAN: - continue gabapentin 244m; continue tapering off  per psychiatry - start propranolol 420mBID - consider migraine observation study  Meds ordered this encounter  Medications  . propranolol (INDERAL) 40 MG tablet    Sig: Take 1 tablet (40 mg total) by mouth 2 (two) times daily.    Dispense:  60 tablet    Refill:  6   Return in about 3 months (around 04/15/2015).     VIPenni BombardMD [id number] Certified in Neurology, Neurophysiology and Neuroimaging  [facility] [address]SuSourisrGuadalupe GuerraNC [id number] [phone]

## 2015-01-13 NOTE — Telephone Encounter (Signed)
Called pt to have her contact medicare insurance to verify if 1.) she is able to receive a pneumonia vaccination and 2.) to verify if she needs the pneumovax or prevnar.

## 2015-01-13 NOTE — Patient Instructions (Signed)
Thank you for coming to see [place] at [facility]. I hope we have been able to provide you high quality care today.  You may receive a patient satisfaction survey over the next few weeks. We would appreciate your feedback and comments so that we may continue to improve ourselves and the health of our patients.  - continue tapering gabapentin off - start propranolol 14m twice a day after discussing migraine study with Rizwan Sabir (Conservation officer, nature - continue tylenol and aleve as needed   ~~~~~~~~~~~~~~~~~~~~~~~~~~~~~~~~~~~~~~~~~~~~~~~~~~~~~~~~~~~~~~~~~  DR. Craige Patel'S GUIDE TO HAPPY AND HEALTHY LIVING These are some of my general health and wellness recommendations. Some of them may apply to you better than others. Please use common sense as you try these suggestions and feel free to ask me any questions.   ACTIVITY/FITNESS Mental, social, emotional and physical stimulation are very important for brain and body health. Try learning a new activity (arts, music, language, sports, games).  Keep moving your body to the best of your abilities. You can do this at home, inside or outside, the park, community center, gym or anywhere you like. Consider a physical therapist or personal trainer to get started. Consider the app Sworkit. Fitness trackers such as smart-watches, smart-phones or Fitbits can help as well.   NUTRITION Eat more plants: colorful vegetables, nuts, seeds and berries.  Eat less sugar, salt, preservatives and processed foods.  Avoid toxins such as cigarettes and alcohol.  Drink water when you are thirsty. Warm water with a slice of lemon is an excellent morning drink to start the day.  Consider these websites for more information The Nutrition Source (hhttps://www.henry-hernandez.biz/ [employer] Nutrition (wWindowBlog.ch   RELAXATION Consider practicing mindfulness meditation or other relaxation techniques  such as deep breathing, prayer, yoga, tai chi, massage. See website mindful.org or the apps Headspace or Calm to help get started.   SLEEP Try to get at least 7-8+ hours sleep per day. Regular exercise and reduced caffeine will help you sleep better. Practice good sleep hygeine techniques. See website sleep.org for more information.   PLANNING Prepare estate planning, living will, healthcare POA documents. Sometimes this is best planned with the help of an attorney. Theconversationproject.org and agingwithdignity.org are excellent resources.

## 2015-01-13 NOTE — Telephone Encounter (Signed)
Pt called in for flu shot and pneumonia shot. Scheduled for 01/15/15. Please f/u as needed.

## 2015-01-14 ENCOUNTER — Telehealth: Payer: Self-pay

## 2015-01-14 NOTE — Telephone Encounter (Signed)
Called and left a message to return call to research. 

## 2015-01-15 ENCOUNTER — Ambulatory Visit: Payer: BC Managed Care – PPO

## 2015-01-17 DIAGNOSIS — M1711 Unilateral primary osteoarthritis, right knee: Secondary | ICD-10-CM | POA: Diagnosis not present

## 2015-01-17 DIAGNOSIS — M25561 Pain in right knee: Secondary | ICD-10-CM | POA: Diagnosis not present

## 2015-01-28 DIAGNOSIS — M170 Bilateral primary osteoarthritis of knee: Secondary | ICD-10-CM | POA: Diagnosis not present

## 2015-01-28 DIAGNOSIS — F331 Major depressive disorder, recurrent, moderate: Secondary | ICD-10-CM | POA: Diagnosis not present

## 2015-01-28 DIAGNOSIS — M25562 Pain in left knee: Secondary | ICD-10-CM | POA: Diagnosis not present

## 2015-01-28 DIAGNOSIS — M25561 Pain in right knee: Secondary | ICD-10-CM | POA: Diagnosis not present

## 2015-02-13 ENCOUNTER — Telehealth: Payer: Self-pay | Admitting: Family Medicine

## 2015-02-13 NOTE — Telephone Encounter (Signed)
Called pt and gave the provider names and phone numbers. Pt stated that she will call and verify if any accept her insurance.

## 2015-02-13 NOTE — Telephone Encounter (Signed)
Pt states her psychiatrist Ulis Rias, MD will be moving out of the area. She is asking if we can refer her to another provider as he is currently also ordering her medications. Please contact her to discuss options. Ph# [phone].

## 2015-02-13 NOTE — Telephone Encounter (Signed)
I usually refer to Dr Caprice Beaver, Dr [facility] Care, or [facility]

## 2015-03-11 ENCOUNTER — Ambulatory Visit: Payer: Medicare Other | Admitting: Diagnostic Neuroimaging

## 2015-03-18 DIAGNOSIS — F3340 Major depressive disorder, recurrent, in remission, unspecified: Secondary | ICD-10-CM | POA: Diagnosis not present

## 2015-03-30 HISTORY — PX: MOLE REMOVAL: SHX2046

## 2015-04-15 ENCOUNTER — Ambulatory Visit (INDEPENDENT_AMBULATORY_CARE_PROVIDER_SITE_OTHER): Payer: Medicare Other | Admitting: Diagnostic Neuroimaging

## 2015-04-15 ENCOUNTER — Encounter: Payer: Self-pay | Admitting: Diagnostic Neuroimaging

## 2015-04-15 VITALS — BP 125/73 | HR 58 | Ht 61.0 in | Wt 125.6 lb

## 2015-04-15 DIAGNOSIS — G43009 Migraine without aura, not intractable, without status migrainosus: Secondary | ICD-10-CM

## 2015-04-15 MED ORDER — NAPROXEN SODIUM 220 MG PO CAPS: ORAL_CAPSULE | ORAL | Status: DC

## 2015-04-15 MED ORDER — PROPRANOLOL HCL 40 MG PO TABS: 40.0000 mg | ORAL_TABLET | Freq: Two times a day (BID) | ORAL | Status: DC

## 2015-04-15 NOTE — Patient Instructions (Signed)

## 2015-04-15 NOTE — Progress Notes (Signed)
[facility]  PATIENT: Theresa Martin DOB: 05/21/1955  REFERRING CLINICIAN: Tabori HISTORY FROM: patient REASON FOR VISIT: follow up   HISTORICAL  CHIEF COMPLAINT:  Chief Complaint  Patient presents with  . Migraine    rm 6, "no migraines since 02/28/15"  . Follow-up    3 month    HISTORY OF PRESENT ILLNESS:   UPDATE 04/15/15: Since last visit, doing well. No more migraine since 02/28/15. Propranolol is helping prevent HA. Mood stable.  UPDATE 01/13/15: Since last visit, having more headaches, and more fluid retention. Now being tapered off gabapentin by psychiatry due to side effects.   UPDATE 09/02/14: Doing well. Avg 3-6 days HA per month. Some HA are mild, some are severe. Taking gabapentin 222m TID. Works out every day at [employer] Overall mood is better as well.  UPDATE 05/17/14: Since last visit, patient had some psychiatry issues, was managed by behavioral health, and her headaches significant improved. She was doing well for several years and did not follow-up in our clinic. Patient here with her father today for this visit. Patient's father notes that her headaches seem to be significantly associated with her stress levels. In the last few months her stress levels have increased significantly. Her stress levels related to her ex-husband and her daughters, and some family issues. Patient having intermittent, almost daily left-sided headaches, with numbness and tingling in the left scalp, typically in the evening when she is home. During the daytime patient stays active, works out several times a week, and does better. Patient had been on gabapentin 100 mg at bedtime for several years. Her psychiatrist increased this to 2 and then 3 capsules at bedtime. When she did 3 capsules at bedtime, her headache type symptoms paradoxical he worsened. She then went back to taking one capsule at bedtime. Strangely, she reports that she was told she was able to take this  medication (gabapentin) as needed as well and for the last few weeks has been taking one capsule at bedtime, followed by 1-2 capsules every hour, throughout the night, taking up to 10-14 capsules in a 12 hour period.   PRIOR HPI (11/04/08 - 06/03/09, VRP): 60year old right-handed female with history of high blood pressure, depression and anxiety presenting for evaluation of chronic headaches and abnormal MRI scan.  On October 26, 2008 the patient presented to the emergency room for right-sided chest pain and was diagnosed with pneumonia.   At this time her headaches worsened in severity.  Upon review of prior MRIs demonstrating microvascular gliosis, patient was referred to our neurology clinic for futher evaluation. Patient's headaches began in 2005 consisting of a "dull pressure like sensation" over the top of her head. Occasionally these are associated with mild nausea without vomiting as well as intermittent left facial numbness.  Patient had dull nagging low level headaches on a daily basis with daily flareups involving a hot sensation over her scalp.  Sometimes this sensation starts as posterior neck pain that moves up the back of her head. She was initially evaluated with MRI scans in 2005 and 2006 and started on topiramate 100 mg at bedtime.   In addition she was taking Advil and Tylenol over-the-counter almost a daily basis.   During flareups the patient would have to lay down in place ice packs over her eyes and the top of her head.   Patient's headaches are aggravated by lack of sleep or stress. No food triggers noted.  Her other physicians decided to taper  her off of NSAIDs, tylenol and also her topiramate. Patient had MRI of the head and neck, found to have some nonspecific white matter lesions. Patient had lumbar puncture and additional blood testing, but multiple sclerosis was ruled out. Patient was treated with several occipital nerve blocks with good results.   REVIEW OF SYSTEMS: Full 14 system  review of systems performed and notable only for depression anxiety restless legs headache light sens.  ALLERGIES: No Known Allergies  HOME MEDICATIONS: Outpatient Prescriptions Prior to Visit  Medication Sig Dispense Refill  . acetaminophen (TYLENOL) 325 MG tablet Take 650 mg by mouth every 6 (six) hours as needed.    Marland Kitchen atorvastatin (LIPITOR) 20 MG tablet Take 1 tablet (20 mg total) by mouth daily. 30 tablet 6  . Calcium-Vitamin D-Vitamin K (CALCIUM + D + K PO) Take 2 each by mouth daily. Calcium 581m- Vitamin D 1000iu- K 473m   . clonazePAM (KLONOPIN) 0.5 MG tablet Take 0.25 mg by mouth at bedtime.   2  . escitalopram (LEXAPRO) 10 MG tablet Take 15 mg by mouth daily. TAKE ONE AND HALF TABS DAILY    . fluocinonide ointment (LIDEX) 0.05 % as needed.   2  . Glucosamine-Chondroitin (MOVE FREE PO) Take 1 tablet by mouth daily.    . Marland Kitchenetoconazole-Hydrocortisone 2 & 1 % KIT Apply topically as needed. Cracks on mouth    . lamoTRIgine (LAMICTAL) 100 MG tablet Take 100 mg by mouth daily. TAKE ONE TABLET QAM AND ONE TABLETS AT BEDTIME    . Melatonin 3 MG CAPS Take 1 capsule by mouth at bedtime.    . naproxen sodium (ANAPROX) 220 MG tablet Take 220 mg by mouth 2 (two) times daily with a meal.    . propranolol (INDERAL) 40 MG tablet Take 1 tablet (40 mg total) by mouth 2 (two) times daily. 60 tablet 6  . QUEtiapine (SEROQUEL XR) 300 MG 24 hr tablet Take 300 mg by mouth 2 (two) times daily.      . Marland Kitchenriamterene-hydrochlorothiazide (MAXZIDE-25) 37.5-25 MG per tablet TAKE 1 TABLET BY MOUTH DAILY. 30 tablet 6  . valACYclovir (VALTREX) 1000 MG tablet Take 1,000 mg by mouth 2 (two) times daily as needed.     . zoster vaccine live, PF, (ZOSTAVAX) 1916073NT/0.65ML injection Inject 19,400 Units into the skin once. 1 each 0  . folic acid (FOLVITE) 1 MG tablet Take 1 mg by mouth daily.    . Marland Kitchenabapentin (NEURONTIN) 100 MG capsule Take 2 capsules (200 mg total) by mouth 3 (three) times daily. (Patient taking  differently: Take 200 mg by mouth. 2 tablets at 9am, 2 tablets at lunch, 2 tablets at 9pm) 540 capsule 4   No facility-administered medications prior to visit.    PAST MEDICAL HISTORY: Past Medical History  Diagnosis Date  . Anxiety   . Depression   . Hypertension   . Allergy   . Persistent headaches   . Kidney stones   . Pneumonia   . Hyperlipidemia   . Shingles outbreak 02/2014    PAST SURGICAL HISTORY: Past Surgical History  Procedure Laterality Date  . Cesarean section  1987, 1989  . Exploratory laparotomy      laproscopy for infertility  . Cardiovascular stress test  02/2009    treadmill stress test: Low risk    FAMILY HISTORY: Family History  Problem Relation Age of Onset  . Hypertension Mother   . Coronary artery disease Mother   . Heart failure Mother   .  Hypertension Father   . Liver disease Sister     liver failure  . Arrhythmia Brother   . Hypertension Brother   . Cancer Neg Hx     colon     SOCIAL HISTORY:  Social History   Social History  . Marital Status: Legally Separated    Spouse Name: N/A  . Number of Children: 2  . Years of Education: N/A   Occupational History  . Teacher assistant    Social History Main Topics  . Smoking status: Never Smoker   . Smokeless tobacco: Not on file  . Alcohol Use: No     Comment: rare  . Drug Use: No  . Sexual Activity: Not on file   Other Topics Concern  . Not on file   Social History Narrative   Regular exercise- yes, daily running, elliptical    Diet: fruits and veggies, water    No caffeine     PHYSICAL EXAM  Filed Vitals:   04/15/15 1459  BP: 125/73  Pulse: 58  Height: 5' 1"  (1.549 m)  Weight: 125 lb 9.6 oz (56.972 kg)    Body mass index is 23.74 kg/(m^2).  No exam data present  No flowsheet data found.  GENERAL EXAM: Patient is in no distress; well developed, nourished and groomed; neck is supple  CARDIOVASCULAR: Regular rate and rhythm, no murmurs, no carotid bruits;  REGULAR BRADYCARDIA  NEUROLOGIC: MENTAL STATUS: awake, alert, language fluent, comprehension intact, naming intact, fund of knowledge appropriate CRANIAL NERVE: pupils equal and reactive to light, visual fields full to confrontation, extraocular muscles intact, no nystagmus, facial sensation and strength symmetric, hearing intact, palate elevates symmetrically, uvula midline, shoulder shrug symmetric, tongue midline. MOTOR: normal bulk and tone, full strength in the BUE, BLE SENSORY: normal and symmetric to light touch, temperature, vibration COORDINATION: finger-nose-finger, fine finger movements normal REFLEXES: deep tendon reflexes present and symmetric GAIT/STATION: narrow based gait; romberg is negative; ABLE TO WALK ON TOES AND HEELS    DIAGNOSTIC DATA (LABS, IMAGING, TESTING) - I reviewed patient records, labs, notes, testing and imaging myself where available.  Lab Results  Component Value Date   WBC 4.1 09/26/2014   HGB 13.2 09/26/2014   HCT 39.4 09/26/2014   MCV 92.7 09/26/2014   PLT 223.0 09/26/2014      Component Value Date/Time   NA 141 09/26/2014 1135   K 3.9 09/26/2014 1135   CL 102 09/26/2014 1135   CO2 35* 09/26/2014 1135   GLUCOSE 90 09/26/2014 1135   BUN 12 09/26/2014 1135   CREATININE 0.82 09/26/2014 1135   CALCIUM 9.5 09/26/2014 1135   PROT 7.4 09/26/2014 1135   ALBUMIN 4.4 09/26/2014 1135   AST 32 09/26/2014 1135   ALT 34 09/26/2014 1135   ALKPHOS 75 09/26/2014 1135   BILITOT 0.3 09/26/2014 1135   GFRNONAA 80* 05/07/2013 1525   GFRAA >90 05/07/2013 1525   Lab Results  Component Value Date   CHOL 211* 09/26/2014   HDL 53.40 09/26/2014   LDLCALC 133* 09/26/2014   TRIG 124.0 09/26/2014   CHOLHDL 4 09/26/2014   Lab Results  Component Value Date   HGBA1C 5.7 10/18/2011   Lab Results  Component Value Date   VITAMINB12 782 01/16/2010   Lab Results  Component Value Date   TSH 1.91 09/26/2014     11/13/08 LP - opening pressure 7cm H2O; WBC  1, RBC 0, glucose 62, protein 26, OCB (2 is CSF, not seen in serum), IgG index 0.5 (normal),  lyme PCR neg, EBV PCR neg  11/13/08 VEP - normal  11/08/08 MRI cervical - normal  11/08/08 MRI brain (with and without contrast) - multiple supratentorial, periventricular and juxtacortical white matter lesions which may represent chronic demyelinating plaques or perivascular gliosis.  No abnormal enhancement on postcontrast views.     ASSESSMENT AND PLAN  60 y.o. year old female here with mixed tension and migraine headaches, left occipital neuralgia, and longer standing significant depression/anxiety. Current headaches have occipital neuralgia type features. Symptoms seem to be worse with stress and external factors. Overall was doing well on regular gabapentin, now with more headaches (4-10 per month over last 3 month; episodic migraine type). Now on start propranolol and doing well.   PLAN: - continue propranolol 56m BID - continue aleve and tylenol prn migraine  Meds ordered this encounter  Medications  . propranolol (INDERAL) 40 MG tablet    Sig: Take 1 tablet (40 mg total) by mouth 2 (two) times daily.    Dispense:  180 tablet    Refill:  4  . Naproxen Sodium (ALEVE) 220 MG CAPS    Sig: As needed for headache    Dispense:  60 each   Return in about 6 months (around 10/13/2015).     VPenni Bombard MD 18/40/3979 35:36PM Certified in Neurology, Neurophysiology and Neuroimaging  [facility] [address] [place] [phone]

## 2015-04-16 DIAGNOSIS — Z872 Personal history of diseases of the skin and subcutaneous tissue: Secondary | ICD-10-CM | POA: Diagnosis not present

## 2015-04-16 DIAGNOSIS — L821 Other seborrheic keratosis: Secondary | ICD-10-CM | POA: Diagnosis not present

## 2015-04-16 DIAGNOSIS — Z1283 Encounter for screening for malignant neoplasm of skin: Secondary | ICD-10-CM | POA: Diagnosis not present

## 2015-04-16 DIAGNOSIS — D485 Neoplasm of uncertain behavior of skin: Secondary | ICD-10-CM | POA: Diagnosis not present

## 2015-04-16 DIAGNOSIS — L0101 Non-bullous impetigo: Secondary | ICD-10-CM | POA: Diagnosis not present

## 2015-04-19 ENCOUNTER — Other Ambulatory Visit: Payer: Self-pay | Admitting: Family Medicine

## 2015-04-21 DIAGNOSIS — M170 Bilateral primary osteoarthritis of knee: Secondary | ICD-10-CM | POA: Diagnosis not present

## 2015-04-21 DIAGNOSIS — M25562 Pain in left knee: Secondary | ICD-10-CM | POA: Diagnosis not present

## 2015-04-21 DIAGNOSIS — M25561 Pain in right knee: Secondary | ICD-10-CM | POA: Diagnosis not present

## 2015-04-21 NOTE — Telephone Encounter (Signed)
Medication filled to pharmacy as requested.   

## 2015-04-29 DIAGNOSIS — M25561 Pain in right knee: Secondary | ICD-10-CM | POA: Diagnosis not present

## 2015-04-29 DIAGNOSIS — M1711 Unilateral primary osteoarthritis, right knee: Secondary | ICD-10-CM | POA: Diagnosis not present

## 2015-04-30 DIAGNOSIS — F3181 Bipolar II disorder: Secondary | ICD-10-CM | POA: Diagnosis not present

## 2015-05-07 DIAGNOSIS — M25562 Pain in left knee: Secondary | ICD-10-CM | POA: Diagnosis not present

## 2015-05-07 DIAGNOSIS — M1712 Unilateral primary osteoarthritis, left knee: Secondary | ICD-10-CM | POA: Diagnosis not present

## 2015-05-21 ENCOUNTER — Telehealth: Payer: Self-pay | Admitting: Family Medicine

## 2015-05-21 DIAGNOSIS — D225 Melanocytic nevi of trunk: Secondary | ICD-10-CM | POA: Diagnosis not present

## 2015-05-21 DIAGNOSIS — D485 Neoplasm of uncertain behavior of skin: Secondary | ICD-10-CM | POA: Diagnosis not present

## 2015-05-21 DIAGNOSIS — L600 Ingrowing nail: Secondary | ICD-10-CM

## 2015-05-21 NOTE — Telephone Encounter (Signed)
Refer to podiatry- I do not do these in the office

## 2015-05-21 NOTE — Telephone Encounter (Signed)
Pt called to ask if Dr. Birdie Riddle can treat/remove ingrown toenail in office or if she would refer her to have it done. Please advise.

## 2015-05-29 DIAGNOSIS — Z79899 Other long term (current) drug therapy: Secondary | ICD-10-CM | POA: Diagnosis not present

## 2015-05-29 DIAGNOSIS — F3181 Bipolar II disorder: Secondary | ICD-10-CM | POA: Diagnosis not present

## 2015-05-30 ENCOUNTER — Telehealth: Payer: Self-pay | Admitting: Behavioral Health

## 2015-05-30 ENCOUNTER — Encounter: Payer: Self-pay | Admitting: Behavioral Health

## 2015-05-30 NOTE — Telephone Encounter (Signed)
Pre-Visit Call completed with patient and chart updated.   Pre-Visit Info documented in Specialty Comments under [employer].    

## 2015-06-02 ENCOUNTER — Encounter: Payer: Self-pay | Admitting: Family Medicine

## 2015-06-02 ENCOUNTER — Ambulatory Visit (INDEPENDENT_AMBULATORY_CARE_PROVIDER_SITE_OTHER): Payer: Medicare Other | Admitting: Family Medicine

## 2015-06-02 VITALS — BP 122/70 | HR 55 | Temp 98.0°F | Resp 16 | Ht 61.0 in | Wt 126.4 lb

## 2015-06-02 DIAGNOSIS — E785 Hyperlipidemia, unspecified: Secondary | ICD-10-CM

## 2015-06-02 DIAGNOSIS — Z780 Asymptomatic menopausal state: Secondary | ICD-10-CM | POA: Diagnosis not present

## 2015-06-02 DIAGNOSIS — Z0000 Encounter for general adult medical examination without abnormal findings: Secondary | ICD-10-CM | POA: Diagnosis not present

## 2015-06-02 DIAGNOSIS — I10 Essential (primary) hypertension: Secondary | ICD-10-CM

## 2015-06-02 LAB — LIPID PANEL
CHOLESTEROL: 185 mg/dL (ref 0–200)
HDL: 51.8 mg/dL (ref >39.00)
LDL CALC: 116 mg/dL — AB (ref 0–99)
NonHDL: 132.87
Total CHOL/HDL Ratio: 4
Triglycerides: 83 mg/dL (ref 0.0–149.0)
VLDL: 16.6 mg/dL (ref 0.0–40.0)

## 2015-06-02 LAB — BASIC METABOLIC PANEL
BUN: 16 mg/dL (ref 6–23)
CALCIUM: 9.3 mg/dL (ref 8.4–10.5)
CO2: 31 mEq/L (ref 19–32)
Chloride: 101 mEq/L (ref 96–112)
Creatinine, Ser: 0.87 mg/dL (ref 0.40–1.20)
GFR: 70.49 mL/min (ref >60.00)
GLUCOSE: 96 mg/dL (ref 70–99)
Potassium: 3.8 mEq/L (ref 3.5–5.1)
SODIUM: 138 meq/L (ref 135–145)

## 2015-06-02 LAB — TSH: TSH: 1.57 u[IU]/mL (ref 0.35–4.50)

## 2015-06-02 LAB — CBC WITH DIFFERENTIAL/PLATELET
BASOS ABS: 0 10*3/uL (ref 0.0–0.1)
Basophils Relative: 0.3 % (ref 0.0–3.0)
Eosinophils Absolute: 0.1 10*3/uL (ref 0.0–0.7)
Eosinophils Relative: 1.7 % (ref 0.0–5.0)
HEMATOCRIT: 36.9 % (ref 36.0–46.0)
HEMOGLOBIN: 12.5 g/dL (ref 12.0–15.0)
LYMPHS PCT: 28.3 % (ref 12.0–46.0)
Lymphs Abs: 1.3 10*3/uL (ref 0.7–4.0)
MCHC: 33.9 g/dL (ref 30.0–36.0)
MCV: 92.1 fl (ref 78.0–100.0)
MONOS PCT: 7.9 % (ref 3.0–12.0)
Monocytes Absolute: 0.4 10*3/uL (ref 0.1–1.0)
Neutro Abs: 2.9 10*3/uL (ref 1.4–7.7)
Neutrophils Relative %: 61.8 % (ref 43.0–77.0)
Platelets: 223 10*3/uL (ref 150.0–400.0)
RBC: 4.01 Mil/uL (ref 3.87–5.11)
RDW: 12.3 % (ref 11.5–15.5)
WBC: 4.8 10*3/uL (ref 4.0–10.5)

## 2015-06-02 LAB — HEPATIC FUNCTION PANEL
ALBUMIN: 4.4 g/dL (ref 3.5–5.2)
ALT: 35 U/L (ref 0–35)
AST: 29 U/L (ref 0–37)
Alkaline Phosphatase: 81 U/L (ref 39–117)
BILIRUBIN DIRECT: 0 mg/dL (ref 0.0–0.3)
TOTAL PROTEIN: 7 g/dL (ref 6.0–8.3)
Total Bilirubin: 0.4 mg/dL (ref 0.2–1.2)

## 2015-06-02 NOTE — Progress Notes (Signed)
Pre visit review using our clinic review tool, if applicable. No additional management support is needed unless otherwise documented below in the visit note. 

## 2015-06-02 NOTE — Assessment & Plan Note (Signed)
Chronic problem.  Excellent control.  Asymptomatic.  Check labs.  No anticipated med changes.  Will continue to follow. 

## 2015-06-02 NOTE — Patient Instructions (Signed)
Follow up in 6 months to recheck BP and cholesterol We'll notify you of your lab results and make any changes if needed Keep up the good work on healthy diet and regular exercise- you look great! You are up to date on colonoscopy until 2021- yay! You are up to date on mammogram until July You are not due for the Prevnar vaccine until age 60 Call with any questions or concerns If you want to join [place] at the new [place] office, any scheduled appointments will automatically transfer and we will see you at [address] [place] [address], [place] [address] ([place] Have a great spring!!!

## 2015-06-02 NOTE — Progress Notes (Signed)
   Subjective:    Patient ID: Theresa Martin, female    DOB: [birth year], 60 y.o.   MRN: [id number]  HPI Here today for CPE.  Risk Factors: HTN- chronic problem, well controlled on Propranolol, Maxzide.  Denies CP, SOB, HAs, visual changes, edema. Hyperlipidemia- chronic problem, on Lipitor daily.  Denies abd pain, N/V, myalgias  Physical Activity: exercising regularly Fall Risk: low  Depression: ongoing problem, following w/ psych regularly (Dr Dorann Ou) Hearing: normal to conversational tones and whispered voice at 6 ft ADL's: independent Cognitive: normal linear thought process, memory and attention intact Home Safety: safe at home Height, Weight, BMI, Visual Acuity: see vitals, vision corrected to 20/20 w/ glasses Counseling: UTD on pap, mammo (per pt report), colonoscopy  (2021), Tdap.  Due for flu but pt declines.  Due for DEXA. Care team reviewed and updated w/ pt Labs Ordered: See A&P Care Plan: See A&P    Review of Systems Patient reports no vision/ hearing changes, adenopathy,fever, weight change,  persistant/recurrent hoarseness , swallowing issues, chest pain, palpitations, edema, persistant/recurrent cough, hemoptysis, dyspnea (rest/exertional/paroxysmal nocturnal), gastrointestinal bleeding (melena, rectal bleeding), abdominal pain, significant heartburn, bowel changes, GU symptoms (dysuria, hematuria, incontinence), Gyn symptoms (abnormal  bleeding, pain),  syncope, focal weakness, memory loss, numbness & tingling, skin/hair/nail changes, abnormal bruising or bleeding, anxiety, or depression.     Objective:   Physical Exam General Appearance:    Alert, cooperative, no distress, appears stated age  Head:    Normocephalic, without obvious abnormality, atraumatic  Eyes:    PERRL, conjunctiva/corneas clear, EOM's intact, fundi    benign, both eyes  Ears:    Normal TM's and external ear canals, both ears  Nose:   Nares normal, septum midline, mucosa normal, no drainage   or sinus tenderness  Throat:   Lips, mucosa, and tongue normal; teeth and gums normal  Neck:   Supple, symmetrical, trachea midline, no adenopathy;    Thyroid: no enlargement/tenderness/nodules  Back:     Symmetric, no curvature, ROM normal, no CVA tenderness  Lungs:     Clear to auscultation bilaterally, respirations unlabored  Chest Wall:    No tenderness or deformity   Heart:    Regular rate and rhythm, S1 and S2 normal, no murmur, rub   or gallop  Breast Exam:    Deferred to GYN  Abdomen:     Soft, non-tender, bowel sounds active all four quadrants,    no masses, no organomegaly  Genitalia:    Deferred to GYN  Rectal:    Extremities:   Extremities normal, atraumatic, no cyanosis or edema  Pulses:   2+ and symmetric all extremities  Skin:   Skin color, texture, turgor normal, no rashes or lesions  Lymph nodes:   Cervical, supraclavicular, and axillary nodes normal  Neurologic:   CNII-XII intact, normal strength, sensation and reflexes    throughout          Assessment & Plan:

## 2015-06-02 NOTE — Assessment & Plan Note (Signed)
Pt's PE WNL.  UTD on GYN, colonoscopy.  Written screening schedule updated and given to pt.  Due for DEXA- order entered.  Check labs.  Anticipatory guidance provided.

## 2015-06-02 NOTE — Assessment & Plan Note (Signed)
Chronic problem.  Tolerating statin w/o difficulty.  Check labs.  Adjust meds prn  

## 2015-06-16 DIAGNOSIS — F3181 Bipolar II disorder: Secondary | ICD-10-CM | POA: Diagnosis not present

## 2015-06-16 DIAGNOSIS — F411 Generalized anxiety disorder: Secondary | ICD-10-CM | POA: Diagnosis not present

## 2015-06-18 DIAGNOSIS — H43821 Vitreomacular adhesion, right eye: Secondary | ICD-10-CM | POA: Diagnosis not present

## 2015-06-18 DIAGNOSIS — H40033 Anatomical narrow angle, bilateral: Secondary | ICD-10-CM | POA: Diagnosis not present

## 2015-06-18 DIAGNOSIS — H43812 Vitreous degeneration, left eye: Secondary | ICD-10-CM | POA: Diagnosis not present

## 2015-06-19 ENCOUNTER — Ambulatory Visit (HOSPITAL_BASED_OUTPATIENT_CLINIC_OR_DEPARTMENT_OTHER)
Admission: RE | Admit: 2015-06-19 | Discharge: 2015-06-19 | Disposition: A | Discharge status: Home / Self Care | Payer: Medicare Other | Source: Ambulatory Visit | Attending: Family Medicine | Admitting: Family Medicine

## 2015-06-19 DIAGNOSIS — Z13820 Encounter for screening for osteoporosis: Secondary | ICD-10-CM | POA: Diagnosis not present

## 2015-06-19 DIAGNOSIS — E559 Vitamin D deficiency, unspecified: Secondary | ICD-10-CM | POA: Insufficient documentation

## 2015-06-19 DIAGNOSIS — M8580 Other specified disorders of bone density and structure, unspecified site: Secondary | ICD-10-CM | POA: Diagnosis not present

## 2015-06-19 DIAGNOSIS — Z780 Asymptomatic menopausal state: Secondary | ICD-10-CM | POA: Insufficient documentation

## 2015-06-19 DIAGNOSIS — M8588 Other specified disorders of bone density and structure, other site: Secondary | ICD-10-CM | POA: Diagnosis not present

## 2015-07-08 ENCOUNTER — Encounter: Payer: Self-pay | Admitting: Family Medicine

## 2015-07-15 DIAGNOSIS — F3181 Bipolar II disorder: Secondary | ICD-10-CM | POA: Diagnosis not present

## 2015-07-15 DIAGNOSIS — F411 Generalized anxiety disorder: Secondary | ICD-10-CM | POA: Diagnosis not present

## 2015-07-17 DIAGNOSIS — F3181 Bipolar II disorder: Secondary | ICD-10-CM | POA: Diagnosis not present

## 2015-07-17 DIAGNOSIS — F411 Generalized anxiety disorder: Secondary | ICD-10-CM | POA: Diagnosis not present

## 2015-07-21 DIAGNOSIS — H527 Unspecified disorder of refraction: Secondary | ICD-10-CM | POA: Diagnosis not present

## 2015-07-21 DIAGNOSIS — H18413 Arcus senilis, bilateral: Secondary | ICD-10-CM | POA: Diagnosis not present

## 2015-07-21 DIAGNOSIS — H40031 Anatomical narrow angle, right eye: Secondary | ICD-10-CM | POA: Diagnosis not present

## 2015-07-21 DIAGNOSIS — H40033 Anatomical narrow angle, bilateral: Secondary | ICD-10-CM | POA: Diagnosis not present

## 2015-07-21 DIAGNOSIS — I10 Essential (primary) hypertension: Secondary | ICD-10-CM | POA: Diagnosis not present

## 2015-07-28 DIAGNOSIS — H40033 Anatomical narrow angle, bilateral: Secondary | ICD-10-CM | POA: Diagnosis not present

## 2015-07-29 DIAGNOSIS — F3181 Bipolar II disorder: Secondary | ICD-10-CM | POA: Diagnosis not present

## 2015-07-29 DIAGNOSIS — F411 Generalized anxiety disorder: Secondary | ICD-10-CM | POA: Diagnosis not present

## 2015-08-06 DIAGNOSIS — M170 Bilateral primary osteoarthritis of knee: Secondary | ICD-10-CM | POA: Diagnosis not present

## 2015-08-06 DIAGNOSIS — M25562 Pain in left knee: Secondary | ICD-10-CM | POA: Diagnosis not present

## 2015-08-06 DIAGNOSIS — R262 Difficulty in walking, not elsewhere classified: Secondary | ICD-10-CM | POA: Diagnosis not present

## 2015-08-06 DIAGNOSIS — M25561 Pain in right knee: Secondary | ICD-10-CM | POA: Diagnosis not present

## 2015-08-07 ENCOUNTER — Telehealth: Payer: Self-pay | Admitting: Diagnostic Neuroimaging

## 2015-08-07 NOTE — Telephone Encounter (Signed)
Message For: OFFICE               Taken 11-MAY-17 at  9:44AM by KSK ------------------------------------------------------------  [facility]           CID  [id number]   Patient  SAME                  Pt's Dr  Leta Baptist     Area Code  336  Phone#  [phone] *  DOB  [birth year] / WOULD LIKE TO BE PUT BACK      ON A PREVIOUS MEDICATION                              Disp:Y/N  Y  If Y = C/B If No Response In 63minutes  ============================================================

## 2015-08-07 NOTE — Telephone Encounter (Signed)
Rn call patient about her medication issue. Pt would  Like to discuss with Dr. Colen Darling about changing back to gabapentin. Pt stated she is currently on inderal and its causing weight gain. Rn schedule pt for appt with MD in May 2017.

## 2015-08-11 DIAGNOSIS — H40032 Anatomical narrow angle, left eye: Secondary | ICD-10-CM | POA: Diagnosis not present

## 2015-08-18 DIAGNOSIS — H40033 Anatomical narrow angle, bilateral: Secondary | ICD-10-CM | POA: Diagnosis not present

## 2015-08-19 ENCOUNTER — Ambulatory Visit (INDEPENDENT_AMBULATORY_CARE_PROVIDER_SITE_OTHER): Payer: Medicare Other | Admitting: Diagnostic Neuroimaging

## 2015-08-19 ENCOUNTER — Encounter: Payer: Self-pay | Admitting: Diagnostic Neuroimaging

## 2015-08-19 VITALS — BP 123/67 | HR 51 | Ht 61.0 in | Wt 128.4 lb

## 2015-08-19 DIAGNOSIS — H4020X3 Unspecified primary angle-closure glaucoma, severe stage: Secondary | ICD-10-CM | POA: Diagnosis not present

## 2015-08-19 DIAGNOSIS — G43009 Migraine without aura, not intractable, without status migrainosus: Secondary | ICD-10-CM

## 2015-08-19 MED ORDER — PROPRANOLOL HCL 40 MG PO TABS: 40.0000 mg | ORAL_TABLET | Freq: Two times a day (BID) | ORAL | Status: DC

## 2015-08-19 NOTE — Progress Notes (Signed)
[facility]  PATIENT: Theresa Martin DOB: 01/13/1956  REFERRING CLINICIAN: Tabori HISTORY FROM: patient REASON FOR VISIT: follow up   HISTORICAL  CHIEF COMPLAINT:  Chief Complaint  Patient presents with  . Migraine    rm 7, "discuss meds due to weight gain, maybe go on different med; freq of migraines varies from 1-8/month depending on stress/life factors"  . Follow-up    HISTORY OF PRESENT ILLNESS:   UPDATE 08/19/15: Since last visit, avg 1-10 HA per month. Usually with stress. Some more wt gain noted (~10lbs). Also hada a laser eye procedure for angle closure glaucoma, now improved.  UPDATE 04/15/15: Since last visit, doing well. No more migraine since 02/28/15. Propranolol is helping prevent HA. Mood stable.  UPDATE 01/13/15: Since last visit, having more headaches, and more fluid retention. Now being tapered off gabapentin by psychiatry due to side effects.   UPDATE 09/02/14: Doing well. Avg 3-6 days HA per month. Some HA are mild, some are severe. Taking gabapentin 227m TID. Works out every day at [employer] Overall mood is better as well.  UPDATE 05/17/14: Since last visit, patient had some psychiatry issues, was managed by behavioral health, and her headaches significant improved. She was doing well for several years and did not follow-up in our clinic. Patient here with her father today for this visit. Patient's father notes that her headaches seem to be significantly associated with her stress levels. In the last few months her stress levels have increased significantly. Her stress levels related to her ex-husband and her daughters, and some family issues. Patient having intermittent, almost daily left-sided headaches, with numbness and tingling in the left scalp, typically in the evening when she is home. During the daytime patient stays active, works out several times a week, and does better. Patient had been on gabapentin 100 mg at bedtime for several  years. Her psychiatrist increased this to 2 and then 3 capsules at bedtime. When she did 3 capsules at bedtime, her headache type symptoms paradoxical he worsened. She then went back to taking one capsule at bedtime. Strangely, she reports that she was told she was able to take this medication (gabapentin) as needed as well and for the last few weeks has been taking one capsule at bedtime, followed by 1-2 capsules every hour, throughout the night, taking up to 10-14 capsules in a 12 hour period.   PRIOR HPI (11/04/08 - 06/03/09, VRP): 60year old right-handed female with history of high blood pressure, depression and anxiety presenting for evaluation of chronic headaches and abnormal MRI scan.  On October 26, 2008 the patient presented to the emergency room for right-sided chest pain and was diagnosed with pneumonia.   At this time her headaches worsened in severity.  Upon review of prior MRIs demonstrating microvascular gliosis, patient was referred to our neurology clinic for futher evaluation. Patient's headaches began in 2005 consisting of a "dull pressure like sensation" over the top of her head. Occasionally these are associated with mild nausea without vomiting as well as intermittent left facial numbness.  Patient had dull nagging low level headaches on a daily basis with daily flareups involving a hot sensation over her scalp.  Sometimes this sensation starts as posterior neck pain that moves up the back of her head. She was initially evaluated with MRI scans in 2005 and 2006 and started on topiramate 100 mg at bedtime.   In addition she was taking Advil and Tylenol over-the-counter almost a daily basis.   During  flareups the patient would have to lay down in place ice packs over her eyes and the top of her head.   Patient's headaches are aggravated by lack of sleep or stress. No food triggers noted.  Her other physicians decided to taper her off of NSAIDs, tylenol and also her topiramate. Patient had MRI of the  head and neck, found to have some nonspecific white matter lesions. Patient had lumbar puncture and additional blood testing, but multiple sclerosis was ruled out. Patient was treated with several occipital nerve blocks with good results.   REVIEW OF SYSTEMS: Full 14 system review of systems performed and negative except for: wt gain headache depression anxiety restless legs.  ALLERGIES: No Known Allergies  HOME MEDICATIONS: Outpatient Prescriptions Prior to Visit  Medication Sig Dispense Refill  . acetaminophen (TYLENOL) 325 MG tablet Take 650 mg by mouth every 6 (six) hours as needed.    Marland Kitchen atorvastatin (LIPITOR) 20 MG tablet TAKE 1 TABLET (20 MG TOTAL) BY MOUTH DAILY. 30 tablet 6  . Calcium-Vitamin D-Vitamin K (CALCIUM + D + K PO) Take 2 each by mouth daily. Calcium 520m- Vitamin D 1000iu- K 467m   . clonazePAM (KLONOPIN) 0.5 MG tablet Take 0.25 mg by mouth at bedtime.   2  . escitalopram (LEXAPRO) 10 MG tablet Take 15 mg by mouth daily. TAKE ONE AND HALF TABS DAILY    . fluocinonide ointment (LIDEX) 0.5.46 Apply 1 application topically 2 (two) times daily.    . Glucosamine-Chondroitin (MOVE FREE PO) Take 1 tablet by mouth daily.    . Marland Kitchenetoconazole-Hydrocortisone 2 & 1 % KIT Apply topically as needed. Cracks on mouth    . lamoTRIgine (LAMICTAL) 100 MG tablet Take 100 mg by mouth daily. TAKE ONE TABLET QAM AND ONE TABLETS AT BEDTIME    . Melatonin 3 MG CAPS Take 1 capsule by mouth at bedtime.    . mupirocin ointment (BACTROBAN) 2 % Apply topically once a week.    . Naproxen Sodium (ALEVE) 220 MG CAPS As needed for headache 60 each   . propranolol (INDERAL) 40 MG tablet Take 1 tablet (40 mg total) by mouth 2 (two) times daily. 180 tablet 4  . QUEtiapine (SEROQUEL XR) 300 MG 24 hr tablet Take 300 mg by mouth 2 (two) times daily.      . traZODone (DESYREL) 50 MG tablet     . triamterene-hydrochlorothiazide (MAXZIDE-25) 37.5-25 MG tablet TAKE 1 TABLET BY MOUTH DAILY. 30 tablet 6  .  valACYclovir (VALTREX) 1000 MG tablet Take 1,000 mg by mouth 2 (two) times daily as needed.      No facility-administered medications prior to visit.    PAST MEDICAL HISTORY: Past Medical History  Diagnosis Date  . Anxiety   . Depression   . Hypertension   . Allergy   . Persistent headaches   . Kidney stones   . Pneumonia   . Hyperlipidemia   . Shingles outbreak 02/2014    PAST SURGICAL HISTORY: Past Surgical History  Procedure Laterality Date  . Cesarean section  1987, 1989  . Exploratory laparotomy      laproscopy for infertility  . Cardiovascular stress test  02/2009    treadmill stress test: Low risk  . Mole removal    . Eye surgery Bilateral     laser-correct opening b/tn cornea and iris    FAMILY HISTORY: Family History  Problem Relation Age of Onset  . Hypertension Mother   . Coronary artery disease Mother   .  Heart failure Mother   . Hypertension Father   . Liver disease Sister     liver failure  . Arrhythmia Brother   . Hypertension Brother   . Cancer Neg Hx     colon     SOCIAL HISTORY:  Social History   Social History  . Marital Status: Legally Separated    Spouse Name: N/A  . Number of Children: 2  . Years of Education: N/A   Occupational History  . Teacher assistant    Social History Main Topics  . Smoking status: Never Smoker   . Smokeless tobacco: Not on file  . Alcohol Use: No     Comment: rare  . Drug Use: No  . Sexual Activity: Not on file   Other Topics Concern  . Not on file   Social History Narrative   Regular exercise- yes, daily running, elliptical    Diet: fruits and veggies, water    No caffeine     PHYSICAL EXAM  Filed Vitals:   08/19/15 1012  BP: 123/67  Pulse: 51  Height: 5' 1"  (1.549 m)  Weight: 128 lb 6.4 oz (58.242 kg)   Wt Readings from Last 3 Encounters:  08/19/15 128 lb 6.4 oz (58.242 kg)  06/02/15 126 lb 6 oz (57.323 kg)  04/15/15 125 lb 9.6 oz (56.972 kg)   Body mass index is 24.27  kg/(m^2).  No exam data present  No flowsheet data found.  GENERAL EXAM: Patient is in no distress; well developed, nourished and groomed; neck is supple  CARDIOVASCULAR: Regular rate and rhythm, no murmurs, no carotid bruits; REGULAR BRADYCARDIA  NEUROLOGIC: MENTAL STATUS: awake, alert, language fluent, comprehension intact, naming intact, fund of knowledge appropriate CRANIAL NERVE: pupils equal and reactive to light, visual fields full to confrontation, extraocular muscles intact, no nystagmus, facial sensation and strength symmetric, hearing intact, palate elevates symmetrically, uvula midline, shoulder shrug symmetric, tongue midline. MOTOR: normal bulk and tone, full strength in the BUE, BLE SENSORY: normal and symmetric to light touch, temperature, vibration COORDINATION: finger-nose-finger, fine finger movements normal REFLEXES: deep tendon reflexes present and symmetric GAIT/STATION: narrow based gait; romberg is negative; ABLE TO WALK TANDEM    DIAGNOSTIC DATA (LABS, IMAGING, TESTING) - I reviewed patient records, labs, notes, testing and imaging myself where available.  Lab Results  Component Value Date   WBC 4.8 06/02/2015   HGB 12.5 06/02/2015   HCT 36.9 06/02/2015   MCV 92.1 06/02/2015   PLT 223.0 06/02/2015      Component Value Date/Time   NA 138 06/02/2015 1051   K 3.8 06/02/2015 1051   CL 101 06/02/2015 1051   CO2 31 06/02/2015 1051   GLUCOSE 96 06/02/2015 1051   BUN 16 06/02/2015 1051   CREATININE 0.87 06/02/2015 1051   CALCIUM 9.3 06/02/2015 1051   PROT 7.0 06/02/2015 1051   ALBUMIN 4.4 06/02/2015 1051   AST 29 06/02/2015 1051   ALT 35 06/02/2015 1051   ALKPHOS 81 06/02/2015 1051   BILITOT 0.4 06/02/2015 1051   GFRNONAA 80* 05/07/2013 1525   GFRAA >90 05/07/2013 1525   Lab Results  Component Value Date   CHOL 185 06/02/2015   HDL 51.80 06/02/2015   LDLCALC 116* 06/02/2015   TRIG 83.0 06/02/2015   CHOLHDL 4 06/02/2015   Lab Results    Component Value Date   HGBA1C 5.7 10/18/2011   Lab Results  Component Value Date   FVCBSWHQ75 916 01/16/2010   Lab Results  Component Value Date  TSH 1.57 06/02/2015     11/13/08 LP - opening pressure 7cm H2O; WBC 1, RBC 0, glucose 62, protein 26, OCB (2 is CSF, not seen in serum), IgG index 0.5 (normal), lyme PCR neg, EBV PCR neg  11/13/08 VEP - normal  11/08/08 MRI cervical - normal  11/08/08 MRI brain (with and without contrast) - multiple supratentorial, periventricular and juxtacortical white matter lesions which may represent chronic demyelinating plaques or perivascular gliosis.  No abnormal enhancement on postcontrast views.    ASSESSMENT AND PLAN  60 y.o. year old female here with mixed tension and migraine headaches, left occipital neuralgia, and longer standing significant depression/anxiety. Some headaches have occipital neuralgia type features. Symptoms seem to be worse with stress and external factors.   Tried gabapentin with some benefit, but then was having more HA so switched to propranolol.   Some mild weight gain in last few months. Could be related to seroquel, and less likely propranolol.    Dx:  Migraine without aura and without status migrainosus, not intractable  Angle-closure glaucoma, severe stage - s/p laser surgery bilateral in May 2017     PLAN: - continue propranolol 36m BID - continue aleve and tylenol prn migraine - advised patient on exercise, activity, nutrition to improve weight  Meds ordered this encounter  Medications  . propranolol (INDERAL) 40 MG tablet    Sig: Take 1 tablet (40 mg total) by mouth 2 (two) times daily.    Dispense:  180 tablet    Refill:  4   Return in about 6 months (around 02/19/2016).     VPenni Bombard MD 52/70/3500 [id number] Certified in Neurology, Neurophysiology and Neuroimaging  [facility] [address] [place] [place] [phone]

## 2015-08-19 NOTE — Patient Instructions (Signed)
Thank you for coming to see [place] at [facility]. I hope we have been able to provide you high quality care today.  You may receive a patient satisfaction survey over the next few weeks. We would appreciate your feedback and comments so that we may continue to improve ourselves and the health of our patients.  - continue propranolol 43m twice a day   ~~~~~~~~~~~~~~~~~~~~~~~~~~~~~~~~~~~~~~~~~~~~~~~~~~~~~~~~~~~~~~~~~  DR. Cambree Hendrix'S GUIDE TO HAPPY AND HEALTHY LIVING These are some of my general health and wellness recommendations. Some of them may apply to you better than others. Please use common sense as you try these suggestions and feel free to ask me any questions.   ACTIVITY/FITNESS Mental, social, emotional and physical stimulation are very important for brain and body health. Try learning a new activity (arts, music, language, sports, games).  Keep moving your body to the best of your abilities. You can do this at home, inside or outside, the park, community center, gym or anywhere you like. Consider a physical therapist or personal trainer to get started. Consider the app Sworkit. Fitness trackers such as smart-watches, smart-phones or Fitbits can help as well.   NUTRITION Eat more plants: colorful vegetables, nuts, seeds and berries.  Eat less sugar, salt, preservatives and processed foods.  Avoid toxins such as cigarettes and alcohol.  Drink water when you are thirsty. Warm water with a slice of lemon is an excellent morning drink to start the day.  Consider these websites for more information The Nutrition Source (hhttps://www.henry-hernandez.biz/ Precision Nutrition (wWindowBlog.ch   RELAXATION Consider practicing mindfulness meditation or other relaxation techniques such as deep breathing, prayer, yoga, tai chi, massage. See website mindful.org or the apps Headspace or Calm to help get started.   SLEEP Try  to get at least 7-8+ hours sleep per day. Regular exercise and reduced caffeine will help you sleep better. Practice good sleep hygeine techniques. See website sleep.org for more information.   PLANNING Prepare estate planning, living will, healthcare POA documents. Sometimes this is best planned with the help of an attorney. Theconversationproject.org and agingwithdignity.org are excellent resources.

## 2015-08-21 DIAGNOSIS — H00024 Hordeolum internum left upper eyelid: Secondary | ICD-10-CM | POA: Diagnosis not present

## 2015-08-21 DIAGNOSIS — S0502XA Injury of conjunctiva and corneal abrasion without foreign body, left eye, initial encounter: Secondary | ICD-10-CM | POA: Diagnosis not present

## 2015-08-28 DIAGNOSIS — F411 Generalized anxiety disorder: Secondary | ICD-10-CM | POA: Diagnosis not present

## 2015-08-28 DIAGNOSIS — F3181 Bipolar II disorder: Secondary | ICD-10-CM | POA: Diagnosis not present

## 2015-09-16 DIAGNOSIS — F3181 Bipolar II disorder: Secondary | ICD-10-CM | POA: Diagnosis not present

## 2015-09-16 DIAGNOSIS — F411 Generalized anxiety disorder: Secondary | ICD-10-CM | POA: Diagnosis not present

## 2015-09-22 DIAGNOSIS — F411 Generalized anxiety disorder: Secondary | ICD-10-CM | POA: Diagnosis not present

## 2015-09-22 DIAGNOSIS — F3181 Bipolar II disorder: Secondary | ICD-10-CM | POA: Diagnosis not present

## 2015-09-23 DIAGNOSIS — Z872 Personal history of diseases of the skin and subcutaneous tissue: Secondary | ICD-10-CM | POA: Diagnosis not present

## 2015-09-23 DIAGNOSIS — Z1283 Encounter for screening for malignant neoplasm of skin: Secondary | ICD-10-CM | POA: Diagnosis not present

## 2015-09-26 ENCOUNTER — Telehealth: Payer: Self-pay | Admitting: Medical

## 2015-09-26 ENCOUNTER — Ambulatory Visit (INDEPENDENT_AMBULATORY_CARE_PROVIDER_SITE_OTHER): Payer: Medicare Other | Admitting: Medical

## 2015-09-26 ENCOUNTER — Ambulatory Visit: Payer: Medicare Other | Admitting: Medical

## 2015-09-26 ENCOUNTER — Encounter: Payer: Self-pay | Admitting: Medical

## 2015-09-26 ENCOUNTER — Ambulatory Visit (HOSPITAL_BASED_OUTPATIENT_CLINIC_OR_DEPARTMENT_OTHER)
Admission: RE | Admit: 2015-09-26 | Discharge: 2015-09-26 | Disposition: A | Discharge status: Home / Self Care | Payer: Medicare Other | Source: Ambulatory Visit | Attending: Medical | Admitting: Medical

## 2015-09-26 VITALS — BP 116/74 | HR 50 | Temp 98.0°F | Resp 16 | Ht 61.0 in | Wt 129.4 lb

## 2015-09-26 DIAGNOSIS — R221 Localized swelling, mass and lump, neck: Secondary | ICD-10-CM

## 2015-09-26 DIAGNOSIS — Z0000 Encounter for general adult medical examination without abnormal findings: Secondary | ICD-10-CM

## 2015-09-26 DIAGNOSIS — Z1239 Encounter for other screening for malignant neoplasm of breast: Secondary | ICD-10-CM

## 2015-09-26 DIAGNOSIS — E049 Nontoxic goiter, unspecified: Secondary | ICD-10-CM | POA: Diagnosis not present

## 2015-09-26 DIAGNOSIS — Z1231 Encounter for screening mammogram for malignant neoplasm of breast: Secondary | ICD-10-CM

## 2015-09-26 DIAGNOSIS — E039 Hypothyroidism, unspecified: Secondary | ICD-10-CM | POA: Diagnosis not present

## 2015-09-26 LAB — CBC WITH DIFFERENTIAL/PLATELET
Basophils Absolute: 0 10*3/uL (ref 0.0–0.1)
Basophils Relative: 0.4 % (ref 0.0–3.0)
EOS ABS: 0.1 10*3/uL (ref 0.0–0.7)
Eosinophils Relative: 1.6 % (ref 0.0–5.0)
HEMATOCRIT: 37.7 % (ref 36.0–46.0)
Hemoglobin: 12.7 g/dL (ref 12.0–15.0)
LYMPHS PCT: 32.9 % (ref 12.0–46.0)
Lymphs Abs: 1.7 10*3/uL (ref 0.7–4.0)
MCHC: 33.8 g/dL (ref 30.0–36.0)
MCV: 91.7 fl (ref 78.0–100.0)
MONOS PCT: 10 % (ref 3.0–12.0)
Monocytes Absolute: 0.5 10*3/uL (ref 0.1–1.0)
Neutro Abs: 2.9 10*3/uL (ref 1.4–7.7)
Neutrophils Relative %: 55.1 % (ref 43.0–77.0)
PLATELETS: 224 10*3/uL (ref 150.0–400.0)
RBC: 4.11 Mil/uL (ref 3.87–5.11)
RDW: 12.5 % (ref 11.5–15.5)
WBC: 5.2 10*3/uL (ref 4.0–10.5)

## 2015-09-26 LAB — TSH: TSH: 2.14 u[IU]/mL (ref 0.35–4.50)

## 2015-09-26 NOTE — Progress Notes (Signed)
Pre visit review using our clinic review tool, if applicable. No additional management support is needed unless otherwise documented below in the visit note. 

## 2015-09-26 NOTE — Patient Instructions (Addendum)
For your area in lower aspect of the neck will order [place] of neck.(Depending on [place] results I may need to send you to specialist. Can get [place] today at 1:30  We will get tsh and cbc today.  My nurse is working on ordering your mammogram.(I put in screening cancer as diagnosis and computer would not accept diagnosis)  Follow up in 7-10 days or as needed

## 2015-09-26 NOTE — Telephone Encounter (Signed)
Will you order screening mammogram for pt.

## 2015-09-26 NOTE — Progress Notes (Signed)
Subjective:    Patient ID: Theresa Martin, female    DOB: [birth year], 60 y.o.   MRN: [id number]  HPI  Pt in reporting that she wants her thyroid checked. She felt slight lump aroudn thyroid area a couple of weeks ago. No pain. Pt last march and was normal.  Pt also mentions she needs mammogram. She is due in July this year. Last one was normal.   Review of Systems  Constitutional: Negative for fever, chills and fatigue.  Respiratory: Negative for cough, choking and wheezing.   Cardiovascular: Negative for chest pain and palpitations.  Gastrointestinal: Negative for abdominal pain.  Musculoskeletal: Negative for back pain.  Neurological: Negative for dizziness and light-headedness.  Hematological: Negative for adenopathy. Does not bruise/bleed easily.  Psychiatric/Behavioral: Negative for behavioral problems and confusion.    Past Medical History  Diagnosis Date  . Anxiety   . Depression   . Hypertension   . Allergy   . Persistent headaches   . Kidney stones   . Pneumonia   . Hyperlipidemia   . Shingles outbreak 02/2014     Social History   Social History  . Marital Status: Legally Separated    Spouse Name: N/A  . Number of Children: 2  . Years of Education: N/A   Occupational History  . Teacher assistant    Social History Main Topics  . Smoking status: Never Smoker   . Smokeless tobacco: Not on file  . Alcohol Use: No     Comment: rare  . Drug Use: No  . Sexual Activity: Not on file   Other Topics Concern  . Not on file   Social History Narrative   Regular exercise- yes, daily running, elliptical    Diet: fruits and veggies, water    No caffeine    Past Surgical History  Procedure Laterality Date  . Cesarean section  1987, 1989  . Exploratory laparotomy      laproscopy for infertility  . Cardiovascular stress test  02/2009    treadmill stress test: Low risk  . Mole removal    . Eye surgery Bilateral     laser-correct opening b/tn cornea  and iris    Family History  Problem Relation Age of Onset  . Hypertension Mother   . Coronary artery disease Mother   . Heart failure Mother   . Hypertension Father   . Liver disease Sister     liver failure  . Arrhythmia Brother   . Hypertension Brother   . Cancer Neg Hx     colon     No Known Allergies  Current Outpatient Prescriptions on File Prior to Visit  Medication Sig Dispense Refill  . acetaminophen (TYLENOL) 325 MG tablet Take 650 mg by mouth every 6 (six) hours as needed.    Marland Kitchen atorvastatin (LIPITOR) 20 MG tablet TAKE 1 TABLET (20 MG TOTAL) BY MOUTH DAILY. 30 tablet 6  . Calcium-Vitamin D-Vitamin K (CALCIUM + D + K PO) Take 2 each by mouth daily. Calcium 531m- Vitamin D 1000iu- K 420m   . clonazePAM (KLONOPIN) 0.5 MG tablet Take 0.25 mg by mouth at bedtime.   2  . escitalopram (LEXAPRO) 10 MG tablet Take 15 mg by mouth daily. TAKE ONE AND HALF TABS DAILY    . fluocinonide ointment (LIDEX) 0.6.78 Apply 1 application topically 2 (two) times daily.    . Glucosamine-Chondroitin (MOVE FREE PO) Take 1 tablet by mouth daily.    . Marland Kitchenetoconazole-Hydrocortisone 2 & 1 %  KIT Apply topically as needed. Cracks on mouth    . lamoTRIgine (LAMICTAL) 100 MG tablet Take 100 mg by mouth daily. TAKE ONE TABLET QAM AND ONE TABLETS AT BEDTIME    . Melatonin 3 MG CAPS Take 1 capsule by mouth at bedtime.    . mupirocin ointment (BACTROBAN) 2 % Apply topically once a week.    . Naproxen Sodium (ALEVE) 220 MG CAPS As needed for headache 60 each   . propranolol (INDERAL) 40 MG tablet Take 1 tablet (40 mg total) by mouth 2 (two) times daily. 180 tablet 4  . QUEtiapine (SEROQUEL XR) 300 MG 24 hr tablet Take 300 mg by mouth 2 (two) times daily.      . traZODone (DESYREL) 50 MG tablet     . triamterene-hydrochlorothiazide (MAXZIDE-25) 37.5-25 MG tablet TAKE 1 TABLET BY MOUTH DAILY. 30 tablet 6  . valACYclovir (VALTREX) 1000 MG tablet Take 1,000 mg by mouth 2 (two) times daily as needed.      No  current facility-administered medications on file prior to visit.    BP 116/74 mmHg  Pulse 50  Temp(Src) 98 F (36.7 C) (Oral)  Resp 16  Ht 5' 1"  (1.549 m)  Wt 129 lb 6.4 oz (58.695 kg)  BMI 24.46 kg/m2  SpO2 98%       Objective:   Physical Exam  General Mental Status- Alert. General Appearance- Not in acute distress.   Skin General: Color- Normal Color. Moisture- Normal Moisture.  Neck Carotid Arteries- Normal color. Moisture- Normal Moisture. No carotid bruits. No JVD. Thyroid area feels mild full at best. No obious enlarged. Soft full area feels like mass in suprasternal notch region.  Chest and Lung Exam Auscultation: Breath Sounds:-Normal.  Cardiovascular Auscultation:Rythm- Regular. Murmurs & Other Heart Sounds:Auscultation of the heart reveals- No Murmurs.    Neurologic Cranial Nerve exam:- CN III-XII intact(No nystagmus), symmetric smile. Strength:- 5/5 equal and symmetric strength both upper and lower extremities.      Assessment & Plan:  For your area in lower aspect of the neck will order [place] of neck.(Depending on [place] results I may need to send you to specialist.  We will get tsh and cbc today.  My nurse is working on ordering your mammogram.(I put in screening cancer as diagnosis and computer would not accept diagnosis.  Follow up in 7-10 days or as needed    Duey Liller, Percell Miller, [employer]

## 2015-09-26 NOTE — Telephone Encounter (Signed)
Spoke with Tim at [employer] and he provided a number for the provider line to call for a claim for the pt and the number was [phone]. I had to put the order in the system as an encounter for a screening mammogram.

## 2015-09-28 ENCOUNTER — Telehealth: Payer: Self-pay | Admitting: Medical

## 2015-09-28 DIAGNOSIS — R221 Localized swelling, mass and lump, neck: Secondary | ICD-10-CM

## 2015-09-28 NOTE — Telephone Encounter (Signed)
Referred to ent

## 2015-09-29 ENCOUNTER — Telehealth: Payer: Self-pay | Admitting: Emergency Medicine

## 2015-09-29 NOTE — Telephone Encounter (Signed)
Informed pt of lab results. Discussed providers suggestions of being seen by ENT. Pt verbalized understanding.

## 2015-09-29 NOTE — Telephone Encounter (Signed)
-----   Message from Bunnie Domino, LPN sent at [id number] 10:53 AM EDT -----   ----- Message -----    From: Mackie Pai, PA-C    Sent: 09/28/2015   5:44 PM      To: Beatris Ship Mabe, CMA  Pt has normal tsh/thyroid function and normal infection fighting cells. Pt neck ultrasound looks normal per radiologist.  Particularly area of concern.  Since I think she has fullness of region on exam in area of concernI will refer to ENT. Get there opinion.

## 2015-09-29 NOTE — Telephone Encounter (Signed)
Patient notified

## 2015-09-29 NOTE — Telephone Encounter (Signed)
Reviewed lab results with the pt. Discussed provider recommendation for ENT referral. Pt verbalized understanding.

## 2015-10-03 DIAGNOSIS — L249 Irritant contact dermatitis, unspecified cause: Secondary | ICD-10-CM | POA: Diagnosis not present

## 2015-10-06 ENCOUNTER — Ambulatory Visit: Payer: Medicare Other | Admitting: Medical

## 2015-10-06 DIAGNOSIS — F3181 Bipolar II disorder: Secondary | ICD-10-CM | POA: Diagnosis not present

## 2015-10-06 DIAGNOSIS — F411 Generalized anxiety disorder: Secondary | ICD-10-CM | POA: Diagnosis not present

## 2015-10-07 ENCOUNTER — Ambulatory Visit: Payer: Medicare Other | Admitting: Medical

## 2015-10-07 ENCOUNTER — Ambulatory Visit (HOSPITAL_BASED_OUTPATIENT_CLINIC_OR_DEPARTMENT_OTHER): Payer: Medicare Other

## 2015-10-08 ENCOUNTER — Ambulatory Visit (INDEPENDENT_AMBULATORY_CARE_PROVIDER_SITE_OTHER): Payer: Medicare Other | Admitting: Medical

## 2015-10-08 ENCOUNTER — Encounter: Payer: Self-pay | Admitting: Medical

## 2015-10-08 VITALS — BP 110/70 | HR 87 | Temp 98.1°F

## 2015-10-08 DIAGNOSIS — R221 Localized swelling, mass and lump, neck: Secondary | ICD-10-CM

## 2015-10-08 NOTE — Progress Notes (Signed)
Subjective:    Patient ID: Theresa Martin, female    DOB: [birth year], 60 y.o.   MRN: [id number]  HPI  Pt in reporting that she wants her thyroid checked. She felt slight lump aroudn thyroid area a couple of weeks ago. No pain. Pt last march and was normal.  Above from last note. No change in size of small lump/mass in sternal notch area. No pain. Appointment with ENT delayed so she returned.   Tsh, cbc and [place] over area was normal.  Pt was scheduled with [facility]NT and they gave August 2nd appointment. But she wants to be seen sooner.    Review of Systems  Constitutional: Negative for fever and chills.  HENT: Negative for congestion, drooling, nosebleeds, postnasal drip, rhinorrhea and sinus pressure.   Respiratory: Negative for choking, chest tightness, shortness of breath and wheezing.   Cardiovascular: Negative for chest pain and palpitations.  Musculoskeletal:       Suprasternal area notch small mass/fullness.  Skin: Negative for pallor and rash.  Neurological: Negative for dizziness and headaches.  Hematological: Negative for adenopathy. Does not bruise/bleed easily.  Psychiatric/Behavioral: Negative for behavioral problems and confusion.    Past Medical History  Diagnosis Date  . Anxiety   . Depression   . Hypertension   . Allergy   . Persistent headaches   . Kidney stones   . Pneumonia   . Hyperlipidemia   . Shingles outbreak 02/2014     Social History   Social History  . Marital Status: Legally Separated    Spouse Name: N/A  . Number of Children: 2  . Years of Education: N/A   Occupational History  . Teacher assistant    Social History Main Topics  . Smoking status: Never Smoker   . Smokeless tobacco: Not on file  . Alcohol Use: No     Comment: rare  . Drug Use: No  . Sexual Activity: Not on file   Other Topics Concern  . Not on file   Social History Narrative   Regular exercise- yes, daily running, elliptical    Diet: fruits and  veggies, water    No caffeine    Past Surgical History  Procedure Laterality Date  . Cesarean section  1987, 1989  . Exploratory laparotomy      laproscopy for infertility  . Cardiovascular stress test  02/2009    treadmill stress test: Low risk  . Mole removal    . Eye surgery Bilateral     laser-correct opening b/tn cornea and iris    Family History  Problem Relation Age of Onset  . Hypertension Mother   . Coronary artery disease Mother   . Heart failure Mother   . Hypertension Father   . Liver disease Sister     liver failure  . Arrhythmia Brother   . Hypertension Brother   . Cancer Neg Hx     colon     No Known Allergies  Current Outpatient Prescriptions on File Prior to Visit  Medication Sig Dispense Refill  . acetaminophen (TYLENOL) 325 MG tablet Take 650 mg by mouth every 6 (six) hours as needed.    Marland Kitchen atorvastatin (LIPITOR) 20 MG tablet TAKE 1 TABLET (20 MG TOTAL) BY MOUTH DAILY. 30 tablet 6  . Calcium-Vitamin D-Vitamin K (CALCIUM + D + K PO) Take 2 each by mouth daily. Calcium 59m- Vitamin D 1000iu- K 461m   . clonazePAM (KLONOPIN) 0.5 MG tablet Take 0.25 mg by mouth  at bedtime.   2  . escitalopram (LEXAPRO) 10 MG tablet Take 15 mg by mouth daily. TAKE ONE AND HALF TABS DAILY    . fluocinonide ointment (LIDEX) 7.57 % Apply 1 application topically 2 (two) times daily.    . Glucosamine-Chondroitin (MOVE FREE PO) Take 1 tablet by mouth daily.    Marland Kitchen Ketoconazole-Hydrocortisone 2 & 1 % KIT Apply topically as needed. Cracks on mouth    . lamoTRIgine (LAMICTAL) 100 MG tablet Take 100 mg by mouth daily. TAKE 300 mg by mouth daily.    . Melatonin 3 MG CAPS Take 1 capsule by mouth at bedtime.    . mupirocin ointment (BACTROBAN) 2 % Apply topically once a week.    . Naproxen Sodium (ALEVE) 220 MG CAPS As needed for headache 60 each   . propranolol (INDERAL) 40 MG tablet Take 1 tablet (40 mg total) by mouth 2 (two) times daily. 180 tablet 4  . QUEtiapine (SEROQUEL XR) 300  MG 24 hr tablet Take 200 mg by mouth 2 (two) times daily.     . traZODone (DESYREL) 50 MG tablet     . triamterene-hydrochlorothiazide (MAXZIDE-25) 37.5-25 MG tablet TAKE 1 TABLET BY MOUTH DAILY. 30 tablet 6  . valACYclovir (VALTREX) 1000 MG tablet Take 1,000 mg by mouth 2 (two) times daily as needed.      No current facility-administered medications on file prior to visit.    BP 110/70 mmHg  Pulse 87  Temp(Src) 98.1 F (36.7 C) (Oral)  SpO2 98%       Objective:   Physical Exam  General- No acute distress. Pleasant patient. Neck- Full range of motion, no jvd Base of neck- fullness in suprasternal notch region. No definite mass.  Lungs- Clear, even and unlabored. Heart- regular rate and rhythm. Neurologic- CNII- XII grossly intact.        Assessment & Plan:  For your neck area that feels full in suprasternal notch will try to refer to [facility]NT. I have rechecked the area and may be normal variant but you do report change in area. So want ENT opinion.   Brook at [facility] should be calling you maybe today or tomorrow. If they don't contact you please let us know.

## 2015-10-08 NOTE — Patient Instructions (Signed)
For your neck area that feels full in suprasternal notch will try to refer to [facility]. I have rechecked the area and may be normal variant but you do report change in area. So want ENT opinion.   Brook at [facility] should be calling you maybe today or tomorrow. If they don't contact you please let us know.

## 2015-10-08 NOTE — Progress Notes (Signed)
Pre visit review using our clinic review tool, if applicable. No additional management support is needed unless otherwise documented below in the visit note. 

## 2015-10-13 ENCOUNTER — Ambulatory Visit: Payer: Medicare Other | Admitting: Diagnostic Neuroimaging

## 2015-10-17 DIAGNOSIS — F3181 Bipolar II disorder: Secondary | ICD-10-CM | POA: Diagnosis not present

## 2015-10-17 DIAGNOSIS — F411 Generalized anxiety disorder: Secondary | ICD-10-CM | POA: Diagnosis not present

## 2015-10-22 DIAGNOSIS — F3181 Bipolar II disorder: Secondary | ICD-10-CM | POA: Diagnosis not present

## 2015-10-22 DIAGNOSIS — F411 Generalized anxiety disorder: Secondary | ICD-10-CM | POA: Diagnosis not present

## 2015-10-29 DIAGNOSIS — R221 Localized swelling, mass and lump, neck: Secondary | ICD-10-CM | POA: Diagnosis not present

## 2015-11-02 ENCOUNTER — Other Ambulatory Visit: Payer: Self-pay | Admitting: Family Medicine

## 2015-11-05 DIAGNOSIS — F3181 Bipolar II disorder: Secondary | ICD-10-CM | POA: Diagnosis not present

## 2015-11-05 DIAGNOSIS — F411 Generalized anxiety disorder: Secondary | ICD-10-CM | POA: Diagnosis not present

## 2015-11-06 ENCOUNTER — Ambulatory Visit (HOSPITAL_BASED_OUTPATIENT_CLINIC_OR_DEPARTMENT_OTHER): Payer: Medicare Other

## 2015-11-10 DIAGNOSIS — F411 Generalized anxiety disorder: Secondary | ICD-10-CM | POA: Diagnosis not present

## 2015-11-10 DIAGNOSIS — F3181 Bipolar II disorder: Secondary | ICD-10-CM | POA: Diagnosis not present

## 2015-11-19 DIAGNOSIS — F3181 Bipolar II disorder: Secondary | ICD-10-CM | POA: Diagnosis not present

## 2015-11-19 DIAGNOSIS — F411 Generalized anxiety disorder: Secondary | ICD-10-CM | POA: Diagnosis not present

## 2015-12-03 ENCOUNTER — Encounter: Payer: Self-pay | Admitting: General Practice

## 2015-12-03 ENCOUNTER — Encounter: Payer: Self-pay | Admitting: Family Medicine

## 2015-12-03 ENCOUNTER — Ambulatory Visit (INDEPENDENT_AMBULATORY_CARE_PROVIDER_SITE_OTHER): Payer: Medicare Other | Admitting: Family Medicine

## 2015-12-03 ENCOUNTER — Ambulatory Visit: Payer: BC Managed Care – PPO | Admitting: Family Medicine

## 2015-12-03 VITALS — BP 118/80 | HR 54 | Temp 98.6°F | Resp 16 | Ht 61.0 in | Wt 127.0 lb

## 2015-12-03 DIAGNOSIS — E785 Hyperlipidemia, unspecified: Secondary | ICD-10-CM

## 2015-12-03 DIAGNOSIS — Z1159 Encounter for screening for other viral diseases: Secondary | ICD-10-CM

## 2015-12-03 DIAGNOSIS — I10 Essential (primary) hypertension: Secondary | ICD-10-CM

## 2015-12-03 LAB — CBC WITH DIFFERENTIAL/PLATELET
Basophils Absolute: 44 cells/uL (ref 0–200)
Basophils Relative: 1 %
Eosinophils Absolute: 44 cells/uL (ref 15–500)
Eosinophils Relative: 1 %
HEMATOCRIT: 37.5 % (ref 35.0–45.0)
Hemoglobin: 12.6 g/dL (ref 11.7–15.5)
LYMPHS PCT: 33 %
Lymphs Abs: 1452 cells/uL (ref 850–3900)
MCH: 30.7 pg (ref 27.0–33.0)
MCHC: 33.6 g/dL (ref 32.0–36.0)
MCV: 91.2 fL (ref 80.0–100.0)
MONO ABS: 352 {cells}/uL (ref 200–950)
MONOS PCT: 8 %
MPV: 11.4 fL (ref 7.5–12.5)
NEUTROS PCT: 57 %
Neutro Abs: 2508 cells/uL (ref 1500–7800)
Platelets: 230 10*3/uL (ref 140–400)
RBC: 4.11 MIL/uL (ref 3.80–5.10)
RDW: 12.3 % (ref 11.0–15.0)
WBC: 4.4 10*3/uL (ref 3.8–10.8)

## 2015-12-03 LAB — LIPID PANEL
CHOLESTEROL: 148 mg/dL (ref 125–200)
HDL: 60 mg/dL (ref >=46)
LDL Cholesterol: 76 mg/dL (ref <130)
TRIGLYCERIDES: 59 mg/dL (ref <150)
Total CHOL/HDL Ratio: 2.5 Ratio (ref <=5.0)
VLDL: 12 mg/dL (ref <30)

## 2015-12-03 LAB — HEPATIC FUNCTION PANEL
ALBUMIN: 4.6 g/dL (ref 3.6–5.1)
ALT: 26 U/L (ref 6–29)
AST: 27 U/L (ref 10–35)
Alkaline Phosphatase: 78 U/L (ref 33–130)
BILIRUBIN DIRECT: 0.1 mg/dL (ref <=0.2)
Indirect Bilirubin: 0.3 mg/dL (ref 0.2–1.2)
TOTAL PROTEIN: 7 g/dL (ref 6.1–8.1)
Total Bilirubin: 0.4 mg/dL (ref 0.2–1.2)

## 2015-12-03 LAB — BASIC METABOLIC PANEL
BUN: 17 mg/dL (ref 7–25)
CHLORIDE: 101 mmol/L (ref 98–110)
CO2: 30 mmol/L (ref 20–31)
Calcium: 9.6 mg/dL (ref 8.6–10.4)
Creat: 0.94 mg/dL (ref 0.50–0.99)
GLUCOSE: 77 mg/dL (ref 65–99)
POTASSIUM: 4.4 mmol/L (ref 3.5–5.3)
Sodium: 138 mmol/L (ref 135–146)

## 2015-12-03 NOTE — Assessment & Plan Note (Signed)
Chronic problem.  Tolerating statin w/o difficulty.  Applauded efforts at healthy diet and regular exercise.  Check labs.  Adjust meds prn  

## 2015-12-03 NOTE — Assessment & Plan Note (Signed)
Chronic problem.  Currently well controlled.  Asymptomatic.  Check labs.  No anticipated med changes. 

## 2015-12-03 NOTE — Progress Notes (Signed)
   Subjective:    Patient ID: Theresa Martin, female    DOB: 1956/02/17, 60 y.o.   MRN: [id number]  HPI HTN- chronic problem, on Triamterene-HCTZ and Propranolol w/ good control.  No CP, SOB, HAs, visual changes, edema.  Hyperlipidemia- chronic problem, on Lipitor daily.  Pt is exercising regularly.  Denies abd pain, N/V, myalgias.   Review of Systems For ROS see HPI     Objective:   Physical Exam  Constitutional: She is oriented to person, place, and time. She appears well-developed and well-nourished. No distress.  HENT:  Head: Normocephalic and atraumatic.  Eyes: Conjunctivae and EOM are normal. Pupils are equal, round, and reactive to light.  Neck: Normal range of motion. Neck supple. No thyromegaly present.  Cardiovascular: Normal rate, regular rhythm, normal heart sounds and intact distal pulses.   No murmur heard. Pulmonary/Chest: Effort normal and breath sounds normal. No respiratory distress.  Abdominal: Soft. She exhibits no distension. There is no tenderness.  Musculoskeletal: She exhibits no edema.  Lymphadenopathy:    She has no cervical adenopathy.  Neurological: She is alert and oriented to person, place, and time.  Skin: Skin is warm and dry.  Psychiatric: She has a normal mood and affect. Her behavior is normal.  Vitals reviewed.         Assessment & Plan:

## 2015-12-03 NOTE — Patient Instructions (Signed)
Schedule your complete physical in 6 months We'll notify you of your lab results and make any changes if needed Keep up the good work on healthy diet and regular exercise- you look great!! Call with any questions or concerns Happy Fall!!! 

## 2015-12-03 NOTE — Progress Notes (Signed)
Pre visit review using our clinic review tool, if applicable. No additional management support is needed unless otherwise documented below in the visit note. 

## 2015-12-04 LAB — HEPATITIS C ANTIBODY: HCV Ab: NEGATIVE

## 2015-12-08 DIAGNOSIS — F411 Generalized anxiety disorder: Secondary | ICD-10-CM | POA: Diagnosis not present

## 2015-12-08 DIAGNOSIS — F3181 Bipolar II disorder: Secondary | ICD-10-CM | POA: Diagnosis not present

## 2015-12-09 ENCOUNTER — Ambulatory Visit: Payer: Medicare Other | Attending: Medical

## 2015-12-15 DIAGNOSIS — F411 Generalized anxiety disorder: Secondary | ICD-10-CM | POA: Diagnosis not present

## 2015-12-15 DIAGNOSIS — F3181 Bipolar II disorder: Secondary | ICD-10-CM | POA: Diagnosis not present

## 2015-12-29 ENCOUNTER — Ambulatory Visit
Admission: RE | Admit: 2015-12-29 | Discharge: 2015-12-29 | Disposition: A | Discharge status: Home / Self Care | Payer: Medicare Other | Source: Ambulatory Visit | Attending: Medical | Admitting: Medical

## 2015-12-29 ENCOUNTER — Other Ambulatory Visit: Payer: Self-pay | Admitting: Medical

## 2015-12-29 DIAGNOSIS — Z1231 Encounter for screening mammogram for malignant neoplasm of breast: Secondary | ICD-10-CM | POA: Diagnosis not present

## 2015-12-31 DIAGNOSIS — F411 Generalized anxiety disorder: Secondary | ICD-10-CM | POA: Diagnosis not present

## 2015-12-31 DIAGNOSIS — F3181 Bipolar II disorder: Secondary | ICD-10-CM | POA: Diagnosis not present

## 2016-01-01 ENCOUNTER — Inpatient Hospital Stay
Admission: RE | Admit: 2016-01-01 | Discharge: 2016-01-01 | Disposition: A | Discharge status: Home / Self Care | Payer: Self-pay | Source: Ambulatory Visit | Attending: *Deleted | Admitting: *Deleted

## 2016-01-01 ENCOUNTER — Other Ambulatory Visit: Payer: Self-pay | Admitting: *Deleted

## 2016-01-01 DIAGNOSIS — Z9289 Personal history of other medical treatment: Secondary | ICD-10-CM

## 2016-01-06 DIAGNOSIS — H43812 Vitreous degeneration, left eye: Secondary | ICD-10-CM | POA: Diagnosis not present

## 2016-01-06 DIAGNOSIS — H43821 Vitreomacular adhesion, right eye: Secondary | ICD-10-CM | POA: Diagnosis not present

## 2016-01-14 DIAGNOSIS — F411 Generalized anxiety disorder: Secondary | ICD-10-CM | POA: Diagnosis not present

## 2016-01-14 DIAGNOSIS — F3181 Bipolar II disorder: Secondary | ICD-10-CM | POA: Diagnosis not present

## 2016-02-09 DIAGNOSIS — F411 Generalized anxiety disorder: Secondary | ICD-10-CM | POA: Diagnosis not present

## 2016-02-09 DIAGNOSIS — F3181 Bipolar II disorder: Secondary | ICD-10-CM | POA: Diagnosis not present

## 2016-02-18 DIAGNOSIS — Z01419 Encounter for gynecological examination (general) (routine) without abnormal findings: Secondary | ICD-10-CM | POA: Diagnosis not present

## 2016-02-18 DIAGNOSIS — Z124 Encounter for screening for malignant neoplasm of cervix: Secondary | ICD-10-CM | POA: Diagnosis not present

## 2016-02-23 ENCOUNTER — Encounter: Payer: Self-pay | Admitting: Diagnostic Neuroimaging

## 2016-02-23 ENCOUNTER — Ambulatory Visit (INDEPENDENT_AMBULATORY_CARE_PROVIDER_SITE_OTHER): Payer: Medicare Other | Admitting: Diagnostic Neuroimaging

## 2016-02-23 VITALS — BP 105/55 | HR 54 | Wt 124.0 lb

## 2016-02-23 DIAGNOSIS — G44209 Tension-type headache, unspecified, not intractable: Secondary | ICD-10-CM

## 2016-02-23 DIAGNOSIS — M5481 Occipital neuralgia: Secondary | ICD-10-CM

## 2016-02-23 DIAGNOSIS — H4020X3 Unspecified primary angle-closure glaucoma, severe stage: Secondary | ICD-10-CM | POA: Diagnosis not present

## 2016-02-23 DIAGNOSIS — G43009 Migraine without aura, not intractable, without status migrainosus: Secondary | ICD-10-CM | POA: Diagnosis not present

## 2016-02-23 MED ORDER — PROPRANOLOL HCL 40 MG PO TABS: 40.0000 mg | ORAL_TABLET | Freq: Two times a day (BID) | ORAL | 4 refills | Status: DC

## 2016-02-23 NOTE — Progress Notes (Signed)
[facility]  PATIENT: Theresa Martin DOB: [birth year]  REFERRING CLINICIAN: Tabori HISTORY FROM: patient REASON FOR VISIT: follow up   HISTORICAL  CHIEF COMPLAINT:  Chief Complaint  Patient presents with  . Migraine    rm 7, "averages about one headache/week in past 6 months; alternates Tylenol with Advil, takes right away to prevent migraines"  . Follow-up    6 month    HISTORY OF PRESENT ILLNESS:   UPDATE 02/23/16: Since last visit has ~ 1-5 HA per month. Tolerating propranolol + OTC tylenol/aleve for HA mgmt.   UPDATE 08/19/15: Since last visit, avg 1-10 HA per month. Usually with stress. Some more wt gain noted (~10lbs). Also had a a laser eye procedure for angle closure glaucoma, now improved.  UPDATE 04/15/15: Since last visit, doing well. No more migraine since 02/28/15. Propranolol is helping prevent HA. Mood stable.  UPDATE 01/13/15: Since last visit, having more headaches, and more fluid retention. Now being tapered off gabapentin by psychiatry due to side effects.   UPDATE 09/02/14: Doing well. Avg 3-6 days HA per month. Some HA are mild, some are severe. Taking gabapentin 264m TID. Works out every day at [employer] Overall mood is better as well.  UPDATE 05/17/14: Since last visit, patient had some psychiatry issues, was managed by behavioral health, and her headaches significant improved. She was doing well for several years and did not follow-up in our clinic. Patient here with her father today for this visit. Patient's father notes that her headaches seem to be significantly associated with her stress levels. In the last few months her stress levels have increased significantly. Her stress levels related to her ex-husband and her daughters, and some family issues. Patient having intermittent, almost daily left-sided headaches, with numbness and tingling in the left scalp, typically in the evening when she is home. During the daytime patient stays  active, works out several times a week, and does better. Patient had been on gabapentin 100 mg at bedtime for several years. Her psychiatrist increased this to 2 and then 3 capsules at bedtime. When she did 3 capsules at bedtime, her headache type symptoms paradoxical he worsened. She then went back to taking one capsule at bedtime. Strangely, she reports that she was told she was able to take this medication (gabapentin) as needed as well and for the last few weeks has been taking one capsule at bedtime, followed by 1-2 capsules every hour, throughout the night, taking up to 10-14 capsules in a 12 hour period.   PRIOR HPI (11/04/08 - 06/03/09, VRP): 60 year old right-handed female with history of high blood pressure, depression and anxiety presenting for evaluation of chronic headaches and abnormal MRI scan.  On October 26, 2008 the patient presented to the emergency room for right-sided chest pain and was diagnosed with pneumonia.   At this time her headaches worsened in severity.  Upon review of prior MRIs demonstrating microvascular gliosis, patient was referred to our neurology clinic for futher evaluation. Patient's headaches began in 2005 consisting of a "dull pressure like sensation" over the top of her head. Occasionally these are associated with mild nausea without vomiting as well as intermittent left facial numbness.  Patient had dull nagging low level headaches on a daily basis with daily flareups involving a hot sensation over her scalp.  Sometimes this sensation starts as posterior neck pain that moves up the back of her head. She was initially evaluated with MRI scans in 2005 and 2006 and started  on topiramate 100 mg at bedtime.   In addition she was taking Advil and Tylenol over-the-counter almost a daily basis.   During flareups the patient would have to lay down in place ice packs over her eyes and the top of her head.   Patient's headaches are aggravated by lack of sleep or stress. No food triggers  noted.  Her other physicians decided to taper her off of NSAIDs, tylenol and also her topiramate. Patient had MRI of the head and neck, found to have some nonspecific white matter lesions. Patient had lumbar puncture and additional blood testing, but multiple sclerosis was ruled out. Patient was treated with several occipital nerve blocks with good results.   REVIEW OF SYSTEMS: Full 14 system review of systems performed and negative except for: wt gain headache depression anxiety restless legs.  ALLERGIES: No Known Allergies  HOME MEDICATIONS: Outpatient Medications Prior to Visit  Medication Sig Dispense Refill  . acetaminophen (TYLENOL) 325 MG tablet Take 650 mg by mouth every 6 (six) hours as needed.    Marland Kitchen atorvastatin (LIPITOR) 20 MG tablet TAKE 1 TABLET (20 MG TOTAL) BY MOUTH DAILY. 30 tablet 6  . Calcium-Vitamin D-Vitamin K (CALCIUM + D + K PO) Take 2 each by mouth daily. Calcium 533m- Vitamin D 1000iu- K 427m   . clonazePAM (KLONOPIN) 0.5 MG tablet Take 0.25 mg by mouth at bedtime.   2  . escitalopram (LEXAPRO) 10 MG tablet Take 10 mg by mouth daily. TAKE ONE AND HALF TABS DAILY     . Glucosamine-Chondroitin (MOVE FREE PO) Take 1 tablet by mouth daily.    . Marland Kitchenetoconazole-Hydrocortisone 2 & 1 % KIT Apply topically as needed. Cracks on mouth    . lamoTRIgine (LAMICTAL) 100 MG tablet TAKE 10016mn the morning and 200 mg by mouth before bed.    . Melatonin 3 MG CAPS Take 1 capsule by mouth at bedtime.    . Naproxen Sodium (ALEVE) 220 MG CAPS As needed for headache 60 each   . propranolol (INDERAL) 40 MG tablet Take 1 tablet (40 mg total) by mouth 2 (two) times daily. 180 tablet 4  . QUEtiapine (SEROQUEL XR) 300 MG 24 hr tablet Take 300 mg by mouth daily.     . traZODone (DESYREL) 50 MG tablet     . triamterene-hydrochlorothiazide (MAXZIDE-25) 37.5-25 MG tablet TAKE 1 TABLET BY MOUTH DAILY. 30 tablet 6  . valACYclovir (VALTREX) 1000 MG tablet Take 1,000 mg by mouth 2 (two) times daily as  needed.      No facility-administered medications prior to visit.     PAST MEDICAL HISTORY: Past Medical History:  Diagnosis Date  . Allergy   . Anxiety   . Depression   . Hyperlipidemia   . Hypertension   . Kidney stones   . Persistent headaches   . Pneumonia   . Shingles outbreak 02/2014    PAST SURGICAL HISTORY: Past Surgical History:  Procedure Laterality Date  . CARDIOVASCULAR STRESS TEST  02/2009   treadmill stress test: Low risk  . [place] [facility]     laproscopy for infertility  . EYE SURGERY Bilateral    laser-correct opening b/tn cornea and iris  . MOLE REMOVAL  2017   x 2 moles    FAMILY HISTORY: Family History  Problem Relation Age of Onset  . Hypertension Mother   . Coronary artery disease Mother   . Heart failure Mother   . Hypertension Father   .  Liver disease Sister     liver failure  . Arrhythmia Brother   . Hypertension Brother   . Cancer Neg Hx     colon     SOCIAL HISTORY:  Social History   Social History  . Marital status: Legally Separated    Spouse name: N/A  . Number of children: 2  . Years of education: N/A   Occupational History  . Teacher assistant    Social History Main Topics  . Smoking status: Never Smoker  . Smokeless tobacco: Never Used  . Alcohol use No     Comment: rare  . Drug use: No  . Sexual activity: Not on file   Other Topics Concern  . Not on file   Social History Narrative   Regular exercise- yes, daily running, elliptical    Diet: fruits and veggies, water    No caffeine     PHYSICAL EXAM  Vitals:   02/23/16 0945  BP: (!) 105/55  Pulse: (!) 54  Weight: 124 lb (56.2 kg)   Wt Readings from Last 3 Encounters:  02/23/16 124 lb (56.2 kg)  12/03/15 127 lb (57.6 kg)  09/26/15 129 lb 6.4 oz (58.7 kg)   Body mass index is 23.43 kg/m.  No exam data present  No flowsheet data found.  GENERAL EXAM: Patient is in no distress; well developed, nourished  and groomed; neck is supple  CARDIOVASCULAR: Regular rate and rhythm, no murmurs, no carotid bruits; REGULAR BRADYCARDIA  NEUROLOGIC: MENTAL STATUS: awake, alert, language fluent, comprehension intact, naming intact, fund of knowledge appropriate CRANIAL NERVE: pupils equal and reactive to light, visual fields full to confrontation, extraocular muscles intact, no nystagmus, facial sensation and strength symmetric, hearing intact, palate elevates symmetrically, uvula midline, shoulder shrug symmetric, tongue midline. MOTOR: normal bulk and tone, full strength in the BUE, BLE SENSORY: normal and symmetric to light touch, temperature, vibration COORDINATION: finger-nose-finger, fine finger movements normal REFLEXES: deep tendon reflexes present and symmetric GAIT/STATION: narrow based gait; romberg is negative    DIAGNOSTIC DATA (LABS, IMAGING, TESTING) - I reviewed patient records, labs, notes, testing and imaging myself where available.  Lab Results  Component Value Date   WBC 4.4 12/03/2015   HGB 12.6 12/03/2015   HCT 37.5 12/03/2015   MCV 91.2 12/03/2015   PLT 230 12/03/2015      Component Value Date/Time   NA 138 12/03/2015 1505   K 4.4 12/03/2015 1505   CL 101 12/03/2015 1505   CO2 30 12/03/2015 1505   GLUCOSE 77 12/03/2015 1505   BUN 17 12/03/2015 1505   CREATININE 0.94 12/03/2015 1505   CALCIUM 9.6 12/03/2015 1505   PROT 7.0 12/03/2015 1505   ALBUMIN 4.6 12/03/2015 1505   AST 27 12/03/2015 1505   ALT 26 12/03/2015 1505   ALKPHOS 78 12/03/2015 1505   BILITOT 0.4 12/03/2015 1505   GFRNONAA 80 (L) 05/07/2013 1525   GFRAA >90 05/07/2013 1525   Lab Results  Component Value Date   CHOL 148 12/03/2015   HDL 60 12/03/2015   LDLCALC 76 12/03/2015   TRIG 59 12/03/2015   CHOLHDL 2.5 12/03/2015   Lab Results  Component Value Date   HGBA1C 5.7 10/18/2011   Lab Results  Component Value Date   VITAMINB12 782 01/16/2010   Lab Results  Component Value Date   TSH  2.14 09/26/2015    11/13/08 LP - opening pressure 7cm H2O; WBC 1, RBC 0, glucose 62, protein 26, OCB (2 is CSF, not seen  in serum), IgG index 0.5 (normal), lyme PCR neg, EBV PCR neg  11/13/08 VEP - normal  11/08/08 MRI cervical - normal  11/08/08 MRI brain (with and without contrast) - multiple supratentorial, periventricular and juxtacortical white matter lesions which may represent chronic demyelinating plaques or perivascular gliosis.  No abnormal enhancement on postcontrast views.    ASSESSMENT AND PLAN  60 y.o. year old female here with mixed tension and migraine headaches, left occipital neuralgia, and longer standing significant depression/anxiety. Some headaches have occipital neuralgia type features. Symptoms seem to be worse with stress and external factors.   Tried gabapentin with some benefit, but then was having more HA so switched to propranolol.   Some mild weight gain in last few months. Could be related to seroquel, and less likely propranolol.    Dx:  Migraine without aura and without status migrainosus, not intractable  Angle-closure glaucoma, severe stage  Tension headache  Occipital neuralgia of left side    PLAN: - continue propranolol 47m BID - continue aleve and tylenol prn migraine - advised patient on exercise, activity, nutrition to improve weight  Meds ordered this encounter  Medications  . propranolol (INDERAL) 40 MG tablet    Sig: Take 1 tablet (40 mg total) by mouth 2 (two) times daily.    Dispense:  180 tablet    Refill:  4   Return in about 6 months (around 08/22/2016).     VPenni Bombard MD 198/33/8250 [id number] Certified in Neurology, Neurophysiology and Neuroimaging  [facility] [address]y LayneGMerritt Park [place] [phone]

## 2016-02-25 DIAGNOSIS — F3181 Bipolar II disorder: Secondary | ICD-10-CM | POA: Diagnosis not present

## 2016-02-25 DIAGNOSIS — F411 Generalized anxiety disorder: Secondary | ICD-10-CM | POA: Diagnosis not present

## 2016-03-15 DIAGNOSIS — F3181 Bipolar II disorder: Secondary | ICD-10-CM | POA: Diagnosis not present

## 2016-03-15 DIAGNOSIS — F411 Generalized anxiety disorder: Secondary | ICD-10-CM | POA: Diagnosis not present

## 2016-03-16 DIAGNOSIS — H43812 Vitreous degeneration, left eye: Secondary | ICD-10-CM | POA: Diagnosis not present

## 2016-03-24 ENCOUNTER — Ambulatory Visit: Payer: Medicare Other | Admitting: Family Medicine

## 2016-03-24 DIAGNOSIS — B001 Herpesviral vesicular dermatitis: Secondary | ICD-10-CM | POA: Diagnosis not present

## 2016-03-25 DIAGNOSIS — F411 Generalized anxiety disorder: Secondary | ICD-10-CM | POA: Diagnosis not present

## 2016-03-25 DIAGNOSIS — F3181 Bipolar II disorder: Secondary | ICD-10-CM | POA: Diagnosis not present

## 2016-05-03 DIAGNOSIS — F411 Generalized anxiety disorder: Secondary | ICD-10-CM | POA: Diagnosis not present

## 2016-05-03 DIAGNOSIS — F3181 Bipolar II disorder: Secondary | ICD-10-CM | POA: Diagnosis not present

## 2016-05-06 DIAGNOSIS — F3181 Bipolar II disorder: Secondary | ICD-10-CM | POA: Diagnosis not present

## 2016-05-06 DIAGNOSIS — F411 Generalized anxiety disorder: Secondary | ICD-10-CM | POA: Diagnosis not present

## 2016-05-19 DIAGNOSIS — L570 Actinic keratosis: Secondary | ICD-10-CM | POA: Diagnosis not present

## 2016-05-19 DIAGNOSIS — Z86018 Personal history of other benign neoplasm: Secondary | ICD-10-CM | POA: Diagnosis not present

## 2016-05-27 DIAGNOSIS — F411 Generalized anxiety disorder: Secondary | ICD-10-CM | POA: Diagnosis not present

## 2016-05-27 DIAGNOSIS — F3181 Bipolar II disorder: Secondary | ICD-10-CM | POA: Diagnosis not present

## 2016-06-03 ENCOUNTER — Encounter: Payer: Self-pay | Admitting: Family Medicine

## 2016-06-03 ENCOUNTER — Ambulatory Visit (INDEPENDENT_AMBULATORY_CARE_PROVIDER_SITE_OTHER): Payer: Medicare Other | Admitting: Family Medicine

## 2016-06-03 VITALS — BP 106/60 | HR 52 | Temp 98.3°F | Resp 16 | Ht 61.0 in | Wt 116.5 lb

## 2016-06-03 DIAGNOSIS — Z0000 Encounter for general adult medical examination without abnormal findings: Secondary | ICD-10-CM | POA: Diagnosis not present

## 2016-06-03 DIAGNOSIS — I10 Essential (primary) hypertension: Secondary | ICD-10-CM

## 2016-06-03 DIAGNOSIS — E785 Hyperlipidemia, unspecified: Secondary | ICD-10-CM

## 2016-06-03 LAB — LIPID PANEL
CHOLESTEROL: 167 mg/dL (ref 0–200)
HDL: 50.9 mg/dL (ref >39.00)
LDL CALC: 97 mg/dL (ref 0–99)
NonHDL: 116.25
TRIGLYCERIDES: 95 mg/dL (ref 0.0–149.0)
Total CHOL/HDL Ratio: 3
VLDL: 19 mg/dL (ref 0.0–40.0)

## 2016-06-03 LAB — HEPATIC FUNCTION PANEL
ALBUMIN: 4.5 g/dL (ref 3.5–5.2)
ALT: 28 U/L (ref 0–35)
AST: 30 U/L (ref 0–37)
Alkaline Phosphatase: 84 U/L (ref 39–117)
Bilirubin, Direct: 0.1 mg/dL (ref 0.0–0.3)
Total Bilirubin: 0.4 mg/dL (ref 0.2–1.2)
Total Protein: 6.9 g/dL (ref 6.0–8.3)

## 2016-06-03 LAB — CBC WITH DIFFERENTIAL/PLATELET
BASOS ABS: 0 10*3/uL (ref 0.0–0.1)
Basophils Relative: 0.5 % (ref 0.0–3.0)
EOS ABS: 0 10*3/uL (ref 0.0–0.7)
Eosinophils Relative: 0.4 % (ref 0.0–5.0)
HEMATOCRIT: 38.6 % (ref 36.0–46.0)
Hemoglobin: 13.1 g/dL (ref 12.0–15.0)
LYMPHS PCT: 29.1 % (ref 12.0–46.0)
Lymphs Abs: 1.4 10*3/uL (ref 0.7–4.0)
MCHC: 33.9 g/dL (ref 30.0–36.0)
MCV: 93.1 fl (ref 78.0–100.0)
MONOS PCT: 9.9 % (ref 3.0–12.0)
Monocytes Absolute: 0.5 10*3/uL (ref 0.1–1.0)
NEUTROS PCT: 60.1 % (ref 43.0–77.0)
Neutro Abs: 2.9 10*3/uL (ref 1.4–7.7)
Platelets: 234 10*3/uL (ref 150.0–400.0)
RBC: 4.15 Mil/uL (ref 3.87–5.11)
RDW: 12.5 % (ref 11.5–15.5)
WBC: 4.9 10*3/uL (ref 4.0–10.5)

## 2016-06-03 LAB — BASIC METABOLIC PANEL
BUN: 18 mg/dL (ref 6–23)
CALCIUM: 9.7 mg/dL (ref 8.4–10.5)
CO2: 33 mEq/L — ABNORMAL HIGH (ref 19–32)
CREATININE: 0.92 mg/dL (ref 0.40–1.20)
Chloride: 101 mEq/L (ref 96–112)
GFR: 65.87 mL/min (ref >60.00)
GLUCOSE: 83 mg/dL (ref 70–99)
Potassium: 4.3 mEq/L (ref 3.5–5.1)
Sodium: 139 mEq/L (ref 135–145)

## 2016-06-03 LAB — TSH: TSH: 1.03 u[IU]/mL (ref 0.35–4.50)

## 2016-06-03 NOTE — Patient Instructions (Addendum)
Follow up in 6 months to recheck BP and cholesterol We'll notify you of your lab results and make any changes if needed Keep up the good work on healthy diet and regular exercise- you look great! You are up to date on colonoscopy until 2021- yay!!! You are up to date on mammo until October and pap until 2019- yay!!! Call with any questions or concerns [place]!!!  Continue to eat heart healthy diet (full of fruits, vegetables, whole grains, lean protein, water--limit salt, fat, and sugar intake) and increase physical activity as tolerated.  Continue doing brain stimulating activities (puzzles, reading, adult coloring books, staying active) to keep memory sharp.   Bring a copy of your advance directives to your next office visit.    Fall Prevention in the Home Falls can cause injuries. They can happen to people of all ages. There are many things you can do to make your home safe and to help prevent falls. What can I do on the outside of my home?  Regularly fix the edges of walkways and driveways and fix any cracks.  Remove anything that might make you trip as you walk through a door, such as a raised step or threshold.  Trim any bushes or trees on the path to your home.  Use bright outdoor lighting.  Clear any walking paths of anything that might make someone trip, such as rocks or tools.  Regularly check to see if handrails are loose or broken. Make sure that both sides of any steps have handrails.  Any raised decks and porches should have guardrails on the edges.  Have any leaves, snow, or ice cleared regularly.  Use sand or salt on walking paths during winter.  Clean up any spills in your garage right away. This includes oil or grease spills. What can I do in the bathroom?  Use night lights.  Install grab bars by the toilet and in the tub and shower. Do not use towel bars as grab bars.  Use non-skid mats or decals in the tub or shower.  If you need to sit down in  the shower, use a plastic, non-slip stool.  Keep the floor dry. Clean up any water that spills on the floor as soon as it happens.  Remove soap buildup in the tub or shower regularly.  Attach bath mats securely with double-sided non-slip rug tape.  Do not have throw rugs and other things on the floor that can make you trip. What can I do in the bedroom?  Use night lights.  Make sure that you have a light by your bed that is easy to reach.  Do not use any sheets or blankets that are too big for your bed. They should not hang down onto the floor.  Have a firm chair that has side arms. You can use this for support while you get dressed.  Do not have throw rugs and other things on the floor that can make you trip. What can I do in the kitchen?  Clean up any spills right away.  Avoid walking on wet floors.  Keep items that you use a lot in easy-to-reach places.  If you need to reach something above you, use a strong step stool that has a grab bar.  Keep electrical cords out of the way.  Do not use floor polish or wax that makes floors slippery. If you must use wax, use non-skid floor wax.  Do not have throw rugs and other things on  the floor that can make you trip. What can I do with my stairs?  Do not leave any items on the stairs.  Make sure that there are handrails on both sides of the stairs and use them. Fix handrails that are broken or loose. Make sure that handrails are as long as the stairways.  Check any carpeting to make sure that it is firmly attached to the stairs. Fix any carpet that is loose or worn.  Avoid having throw rugs at the top or bottom of the stairs. If you do have throw rugs, attach them to the floor with carpet tape.  Make sure that you have a light switch at the top of the stairs and the bottom of the stairs. If you do not have them, ask someone to add them for you. What else can I do to help prevent falls?  Wear shoes that:  Do not have high  heels.  Have rubber bottoms.  Are comfortable and fit you well.  Are closed at the toe. Do not wear sandals.  If you use a stepladder:  Make sure that it is fully opened. Do not climb a closed stepladder.  Make sure that both sides of the stepladder are locked into place.  Ask someone to hold it for you, if possible.  Clearly mark and make sure that you can see:  Any grab bars or handrails.  First and last steps.  Where the edge of each step is.  Use tools that help you move around (mobility aids) if they are needed. These include:  Canes.  Walkers.  Scooters.  Crutches.  Turn on the lights when you go into a dark area. Replace any light bulbs as soon as they burn out.  Set up your furniture so you have a clear path. Avoid moving your furniture around.  If any of your floors are uneven, fix them.  If there are any pets around you, be aware of where they are.  Review your medicines with your doctor. Some medicines can make you feel dizzy. This can increase your chance of falling. Ask your doctor what other things that you can do to help prevent falls. This information is not intended to replace advice given to you by your health care provider. Make sure you discuss any questions you have with your health care provider. Document Released: 01/09/2009 Document Revised: 08/21/2015 Document Reviewed: 04/19/2014 Elsevier Interactive Patient Education  2017 [place] Maintenance, Female Adopting a healthy lifestyle and getting preventive care can go a long way to promote health and wellness. Talk with your health care provider about what schedule of regular examinations is right for you. This is a good chance for you to check in with your provider about disease prevention and staying healthy. In between checkups, there are plenty of things you can do on your own. Experts have done a lot of research about which lifestyle changes and preventive measures are most  likely to keep you healthy. Ask your health care provider for more information. Weight and diet Eat a healthy diet  Be sure to include plenty of vegetables, fruits, low-fat dairy products, and lean protein.  Do not eat a lot of foods high in solid fats, added sugars, or salt.  Get regular exercise. This is one of the most important things you can do for your health.  Most adults should exercise for at least 150 minutes each week. The exercise should increase your heart rate and make you sweat (  moderate-intensity exercise).  Most adults should also do strengthening exercises at least twice a week. This is in addition to the moderate-intensity exercise. Maintain a healthy weight  Body mass index (BMI) is a measurement that can be used to identify possible weight problems. It estimates body fat based on height and weight. Your health care provider can help determine your BMI and help you achieve or maintain a healthy weight.  For females 31 years of age and older:  A BMI below 18.5 is considered underweight.  A BMI of 18.5 to 24.9 is normal.  A BMI of 25 to 29.9 is considered overweight.  A BMI of 30 and above is considered obese. Watch levels of cholesterol and blood lipids  You should start having your blood tested for lipids and cholesterol at 62 years of age, then have this test every 5 years.  You may need to have your cholesterol levels checked more often if:  Your lipid or cholesterol levels are high.  You are older than 62 years of age.  You are at high risk for heart disease. Cancer screening Lung Cancer  Lung cancer screening is recommended for adults 22-11 years old who are at high risk for lung cancer because of a history of smoking.  A yearly low-dose CT scan of the lungs is recommended for people who:  Currently smoke.  Have quit within the past 15 years.  Have at least a 30-pack-year history of smoking. A pack year is smoking an average of one pack of  cigarettes a day for 1 year.  Yearly screening should continue until it has been 15 years since you quit.  Yearly screening should stop if you develop a health problem that would prevent you from having lung cancer treatment. Breast Cancer  Practice breast self-awareness. This means understanding how your breasts normally appear and feel.  It also means doing regular breast self-exams. Let your health care provider know about any changes, no matter how small.  If you are in your 20s or 30s, you should have a clinical breast exam (CBE) by a health care provider every 1-3 years as part of a regular health exam.  If you are 20 or older, have a CBE every year. Also consider having a breast X-ray (mammogram) every year.  If you have a family history of breast cancer, talk to your health care provider about genetic screening.  If you are at high risk for breast cancer, talk to your health care provider about having an MRI and a mammogram every year.  Breast cancer gene (BRCA) assessment is recommended for women who have family members with BRCA-related cancers. BRCA-related cancers include:  Breast.  Ovarian.  Tubal.  Peritoneal cancers.  Results of the assessment will determine the need for genetic counseling and BRCA1 and BRCA2 testing. Cervical Cancer  Your health care provider may recommend that you be screened regularly for cancer of the pelvic organs (ovaries, uterus, and vagina). This screening involves a pelvic examination, including checking for microscopic changes to the surface of your cervix (Pap test). You may be encouraged to have this screening done every 3 years, beginning at age 46.  For women ages 19-65, health care providers may recommend pelvic exams and Pap testing every 3 years, or they may recommend the Pap and pelvic exam, combined with testing for human papilloma virus (HPV), every 5 years. Some types of HPV increase your risk of cervical cancer. Testing for HPV  may also be done on women of  any age with unclear Pap test results.  Other health care providers may not recommend any screening for nonpregnant women who are considered low risk for pelvic cancer and who do not have symptoms. Ask your health care provider if a screening pelvic exam is right for you.  If you have had past treatment for cervical cancer or a condition that could lead to cancer, you need Pap tests and screening for cancer for at least 20 years after your treatment. If Pap tests have been discontinued, your risk factors (such as having a new sexual partner) need to be reassessed to determine if screening should resume. Some women have medical problems that increase the chance of getting cervical cancer. In these cases, your health care provider may recommend more frequent screening and Pap tests. Colorectal Cancer  This type of cancer can be detected and often prevented.  Routine colorectal cancer screening usually begins at 62 years of age and continues through 62 years of age.  Your health care provider may recommend screening at an earlier age if you have risk factors for colon cancer.  Your health care provider may also recommend using home test kits to check for hidden blood in the stool.  A small camera at the end of a tube can be used to examine your colon directly (sigmoidoscopy or colonoscopy). This is done to check for the earliest forms of colorectal cancer.  Routine screening usually begins at age 42.  Direct examination of the colon should be repeated every 5-10 years through 62 years of age. However, you may need to be screened more often if early forms of precancerous polyps or small growths are found. Skin Cancer  Check your skin from head to toe regularly.  Tell your health care provider about any new moles or changes in moles, especially if there is a change in a mole's shape or color.  Also tell your health care provider if you have a mole that is larger than  the size of a pencil eraser.  Always use sunscreen. Apply sunscreen liberally and repeatedly throughout the day.  Protect yourself by wearing long sleeves, pants, a wide-brimmed hat, and sunglasses whenever you are outside. Heart disease, diabetes, and high blood pressure  High blood pressure causes heart disease and increases the risk of stroke. High blood pressure is more likely to develop in:  People who have blood pressure in the high end of the normal range (130-139/85-89 mm Hg).  People who are overweight or obese.  People who are African American.  If you are 58-21 years of age, have your blood pressure checked every 3-5 years. If you are 52 years of age or older, have your blood pressure checked every year. You should have your blood pressure measured twice-once when you are at a hospital or clinic, and once when you are not at a hospital or clinic. Record the average of the two measurements. To check your blood pressure when you are not at a hospital or clinic, you can use:  An automated blood pressure machine at a pharmacy.  A home blood pressure monitor.  If you are between 17 years and 44 years old, ask your health care provider if you should take aspirin to prevent strokes.  Have regular diabetes screenings. This involves taking a blood sample to check your fasting blood sugar level.  If you are at a normal weight and have a low risk for diabetes, have this test once every three years after 62 years of  age.  If you are overweight and have a high risk for diabetes, consider being tested at a younger age or more often. Preventing infection Hepatitis B  If you have a higher risk for hepatitis B, you should be screened for this virus. You are considered at high risk for hepatitis B if:  You were born in a country where hepatitis B is common. Ask your health care provider which countries are considered high risk.  Your parents were born in a high-risk country, and you have  not been immunized against hepatitis B (hepatitis B vaccine).  You have HIV or AIDS.  You use needles to inject street drugs.  You live with someone who has hepatitis B.  You have had sex with someone who has hepatitis B.  You get hemodialysis treatment.  You take certain medicines for conditions, including cancer, organ transplantation, and autoimmune conditions. Hepatitis C  Blood testing is recommended for:  Everyone born from [birth year] through 1966.  Anyone with known risk factors for hepatitis C. Sexually transmitted infections (STIs)  You should be screened for sexually transmitted infections (STIs) including gonorrhea and chlamydia if:  You are sexually active and are younger than 62 years of age.  You are older than 62 years of age and your health care provider tells you that you are at risk for this type of infection.  Your sexual activity has changed since you were last screened and you are at an increased risk for chlamydia or gonorrhea. Ask your health care provider if you are at risk.  If you do not have HIV, but are at risk, it may be recommended that you take a prescription medicine daily to prevent HIV infection. This is called pre-exposure prophylaxis (PrEP). You are considered at risk if:  You are sexually active and do not regularly use condoms or know the HIV status of your partner(s).  You take drugs by injection.  You are sexually active with a partner who has HIV. Talk with your health care provider about whether you are at high risk of being infected with HIV. If you choose to begin PrEP, you should first be tested for HIV. You should then be tested every 3 months for as long as you are taking PrEP. Pregnancy  If you are premenopausal and you may become pregnant, ask your health care provider about preconception counseling.  If you may become pregnant, take 400 to 800 micrograms (mcg) of folic acid every day.  If you want to prevent pregnancy, talk to  your health care provider about birth control (contraception). Osteoporosis and menopause  Osteoporosis is a disease in which the bones lose minerals and strength with aging. This can result in serious bone fractures. Your risk for osteoporosis can be identified using a bone density scan.  If you are 67 years of age or older, or if you are at risk for osteoporosis and fractures, ask your health care provider if you should be screened.  Ask your health care provider whether you should take a calcium or vitamin D supplement to lower your risk for osteoporosis.  Menopause may have certain physical symptoms and risks.  Hormone replacement therapy may reduce some of these symptoms and risks. Talk to your health care provider about whether hormone replacement therapy is right for you. Follow these instructions at home:  Schedule regular health, dental, and eye exams.  Stay current with your immunizations.  Do not use any tobacco products including cigarettes, chewing tobacco, or electronic cigarettes.  If you are pregnant, do not drink alcohol.  If you are breastfeeding, limit how much and how often you drink alcohol.  Limit alcohol intake to no more than 1 drink per day for nonpregnant women. One drink equals 12 ounces of beer, 5 ounces of wine, or 1 ounces of hard liquor.  Do not use street drugs.  Do not share needles.  Ask your health care provider for help if you need support or information about quitting drugs.  Tell your health care provider if you often feel depressed.  Tell your health care provider if you have ever been abused or do not feel safe at home. This information is not intended to replace advice given to you by your health care provider. Make sure you discuss any questions you have with your health care provider. Document Released: 09/28/2010 Document Revised: 08/21/2015 Document Reviewed: 12/17/2014 Elsevier Interactive Patient Education  2017 [employer].

## 2016-06-03 NOTE — Progress Notes (Addendum)
Subjective:   Theresa Martin is a 61 y.o. female who presents for Medicare Annual (Subsequent) preventive examination.  Review of Systems:  No ROS.  Medicare Wellness Visit.  Cardiac Risk Factors include: dyslipidemia;hypertension;family history of premature cardiovascular disease   Sleep patterns: Sleeps 8-10 hours. Up to void x 1.  Home Safety/Smoke Alarms:  Smoke detectors and security in place.  Living environment; residence and Firearm Safety: Lives alone in 1 story home with 10 steps, uses rail.  Seat Belt Safety/Bike Helmet: Wears seat belt.   Counseling:   Eye Exam-Last exam 04/2015, yearly by [facility] exam 09/2015, every 6 months by [place]  Female:   Pap-09/27/2014, followed by GYN     Mammo-12/29/2015, negative.        Dexa scan-06/19/2015, Osteopenia.         CCS-colonoscopy 01/21/2010, normal. Recall 10 years.       Objective:     Vitals: BP 106/60   Pulse (!) 52   Temp 98.3 F (36.8 C) (Oral)   Resp 16   Ht 5' 1"  (1.549 m)   Wt 116 lb 8 oz (52.8 kg)   SpO2 98%   BMI 22.01 kg/m   Body mass index is 22.01 kg/m.   Tobacco History  Smoking Status  . Never Smoker  Smokeless Tobacco  . Never Used     Counseling given: Yes   Past Medical History:  Diagnosis Date  . Allergy   . Anxiety   . Depression   . Hyperlipidemia   . Hypertension   . Kidney stones   . Persistent headaches   . Pneumonia   . Shingles outbreak 02/2014   Past Surgical History:  Procedure Laterality Date  . CARDIOVASCULAR STRESS TEST  02/2009   treadmill stress test: Low risk  . [place]  . EXPLORATORY LAPAROTOMY     laproscopy for infertility  . EYE SURGERY Bilateral    laser-correct opening b/tn cornea and iris  . MOLE REMOVAL  2017   x 2 moles   Family History  Problem Relation Age of Onset  . Hypertension Mother   . Coronary artery disease Mother   . Heart failure Mother   . Hypertension Father   . Liver  disease Sister     liver failure  . Arrhythmia Brother   . Hypertension Brother   . Cancer Neg Hx     colon    History  Sexual Activity  . Sexual activity: Not on file    Outpatient Encounter Prescriptions as of 06/03/2016  Medication Sig  . acetaminophen (TYLENOL) 325 MG tablet Take 650 mg by mouth every 6 (six) hours as needed.  Marland Kitchen acyclovir (ZOVIRAX) 800 MG tablet   . atorvastatin (LIPITOR) 20 MG tablet TAKE 1 TABLET (20 MG TOTAL) BY MOUTH DAILY.  . Calcium-Vitamin D-Vitamin K (CALCIUM + D + K PO) Take 2 each by mouth daily. Calcium 560m- Vitamin D 1000iu- K 444m . escitalopram (LEXAPRO) 10 MG tablet Take 10 mg by mouth daily.   . Glucosamine-Chondroitin (MOVE FREE PO) Take 1 tablet by mouth daily.  . Marland Kitchenbuprofen (ADVIL,MOTRIN) 200 MG tablet Take 200 mg by mouth every 6 (six) hours as needed.  . Marland Kitchenetoconazole-Hydrocortisone 2 & 1 % KIT Apply topically as needed. Cracks on mouth  . lamoTRIgine (LAMICTAL) 100 MG tablet TAKE 10012mn the morning and 200 mg by mouth before bed.  . Melatonin 3 MG CAPS Take 1 capsule by mouth at  bedtime.  . Naproxen Sodium (ALEVE) 220 MG CAPS As needed for headache  . propranolol (INDERAL) 40 MG tablet Take 1 tablet (40 mg total) by mouth 2 (two) times daily.  . QUEtiapine (SEROQUEL XR) 300 MG 24 hr tablet Take 300 mg by mouth daily.   . traZODone (DESYREL) 50 MG tablet   . triamterene-hydrochlorothiazide (MAXZIDE-25) 37.5-25 MG tablet TAKE 1 TABLET BY MOUTH DAILY.  . clonazePAM (KLONOPIN) 0.5 MG tablet Take 0.25 mg by mouth at bedtime.   . [DISCONTINUED] valACYclovir (VALTREX) 1000 MG tablet Take 1,000 mg by mouth 2 (two) times daily as needed.    No facility-administered encounter medications on file as of 06/03/2016.     Activities of Daily Living In your present state of health, do you have any difficulty performing the following activities: 06/03/2016 06/03/2016  Hearing? N N  Vision? N N  Difficulty concentrating or making decisions? N N  Walking or  climbing stairs? N N  Dressing or bathing? N N  Doing errands, shopping? N N  Preparing Food and eating ? N -  Using the Toilet? N -  In the past six months, have you accidently leaked urine? N -  Do you have problems with loss of bowel control? N -  Managing your Medications? N -  Managing your Finances? N -  Housekeeping or managing your Housekeeping? N -  Some recent data might be hidden    Patient Care Team: [name], MD as PCP - General (Family Medicine) Dalia Heading, CNM as Midwife (Certified Nurse Midwife) Robert Bellow, MD (General Surgery) Ulis Rias, MD (Psychiatry) Penni Bombard, MD as Consulting Physician (Neurology) Sable Feil, MD as Consulting Physician (Gastroenterology) Rushie Chestnut (Psychiatry) [facility])    Assessment:    Physical assessment deferred to PCP.  Exercise Activities and Dietary recommendations Current Exercise Habits: Structured exercise class, Type of exercise: strength training/weights (spin), Time (Minutes): 60, Frequency (Times/Week): 7, Weekly Exercise (Minutes/Week): 420, Intensity: Moderate, Exercise limited by: None identified   Diet (meal preparation, eat out, water intake, caffeinated beverages, dairy products, fruits and vegetables): Drinks water and lemonade, coffee, soy milk  Breakfast: Oatmeal, bananas Lunch: Sandwich on thins Dinner: lean protein, vegetables.   Encouraged to continue activities and healthy food choices.   Goals      Patient Stated   . patient states (pt-stated)          Maintain current health by staying active and making healthy food choices.       Fall Risk Fall Risk  06/03/2016 06/03/2016 02/23/2016 08/19/2015 06/02/2015  Falls in the past year? No No No No No   Depression Screen PHQ 2/9 Scores 06/03/2016 06/03/2016 06/02/2015 05/24/2014  PHQ - 2 Score 0 0 0 0  PHQ- 9 Score - 0 - -     Cognitive Function MMSE - Mini Mental State Exam 06/03/2016    Orientation to time 5  Orientation to Place 5  Registration 3  Attention/ Calculation 5  Recall 3  Language- name 2 objects 2  Language- repeat 1  Language- follow 3 step command 3  Language- read & follow direction 1  Write a sentence 1  Copy design 1  Total score 30        Immunization History  Administered Date(s) Administered  . Tdap 07/27/2012  . Zoster 09/26/2014   Screening Tests Health Maintenance  Topic Date Due  . INFLUENZA VACCINE  12/27/2016 (Originally 10/28/2015)  . HIV Screening  12/27/2016 (  Originally 01/01/1970)  . PAP SMEAR  09/26/2017  . MAMMOGRAM  12/28/2017  . COLONOSCOPY  01/22/2020  . TETANUS/TDAP  07/28/2022  . Hepatitis C Screening  Completed      Plan:      Continue to eat heart healthy diet (full of fruits, vegetables, whole grains, lean protein, water--limit salt, fat, and sugar intake) and increase physical activity as tolerated.  Continue doing brain stimulating activities (puzzles, reading, adult coloring books, staying active) to keep memory sharp.   Bring a copy of your advance directives to your next office visit.  During the course of the visit the patient was educated and counseled about the following appropriate screening and preventive services:   Vaccines to include Pneumoccal, Influenza, Hepatitis B, Td, Zostavax, HCV  Cardiovascular Disease  Colorectal cancer screening  Bone density screening  Diabetes screening  Glaucoma screening  Mammography/PAP  Nutrition counseling   Patient Instructions (the written plan) was given to the patient.   Gerilyn Nestle, RN  06/03/2016   Reviewed documentation as provided by RN.  Agree w/ above.  Annye Asa, MD

## 2016-06-03 NOTE — Progress Notes (Signed)
Pre visit review using our clinic review tool, if applicable. No additional management support is needed unless otherwise documented below in the visit note. 

## 2016-06-03 NOTE — Progress Notes (Signed)
   Subjective:    Patient ID: Theresa Martin, female    DOB: 08/01/1955, 61 y.o.   MRN: [id number]  HPI HTN- chronic problem, on Maxzide and propranolol daily w/ good control.  No CP, SOB, HAs, visual changes, edema.  Hyperlipidemia- chronic problem, on Lipitor daily.  Still exercising daily.  No abd pain, N/V.   Review of Systems For ROS see HPI     Objective:   Physical Exam  Constitutional: She is oriented to person, place, and time. She appears well-developed and well-nourished. No distress.  HENT:  Head: Normocephalic and atraumatic.  Eyes: Conjunctivae and EOM are normal. Pupils are equal, round, and reactive to light.  Neck: Normal range of motion. Neck supple. No thyromegaly present.  Cardiovascular: Normal rate, regular rhythm, normal heart sounds and intact distal pulses.   No murmur heard. Pulmonary/Chest: Effort normal and breath sounds normal. No respiratory distress.  Abdominal: Soft. She exhibits no distension. There is no tenderness.  Musculoskeletal: She exhibits no edema.  Lymphadenopathy:    She has no cervical adenopathy.  Neurological: She is alert and oriented to person, place, and time.  Skin: Skin is warm and dry.  Psychiatric: She has a normal mood and affect. Her behavior is normal.  Vitals reviewed.         Assessment & Plan:

## 2016-06-03 NOTE — Assessment & Plan Note (Signed)
Chronic problem.  Well controlled today.  Asymptomatic.  Check labs.  No anticipated med changes. 

## 2016-06-03 NOTE — Assessment & Plan Note (Signed)
Chronic problem.  Tolerating statin w/o difficulty.  Applauded her healthy diet and regular exercise.  Check labs.  Adjust meds prn

## 2016-06-09 ENCOUNTER — Other Ambulatory Visit: Payer: Self-pay | Admitting: Family Medicine

## 2016-07-02 DIAGNOSIS — F3181 Bipolar II disorder: Secondary | ICD-10-CM | POA: Diagnosis not present

## 2016-07-02 DIAGNOSIS — F411 Generalized anxiety disorder: Secondary | ICD-10-CM | POA: Diagnosis not present

## 2016-07-23 DIAGNOSIS — F3181 Bipolar II disorder: Secondary | ICD-10-CM | POA: Diagnosis not present

## 2016-07-23 DIAGNOSIS — F411 Generalized anxiety disorder: Secondary | ICD-10-CM | POA: Diagnosis not present

## 2016-08-11 DIAGNOSIS — F3181 Bipolar II disorder: Secondary | ICD-10-CM | POA: Diagnosis not present

## 2016-08-11 DIAGNOSIS — F411 Generalized anxiety disorder: Secondary | ICD-10-CM | POA: Diagnosis not present

## 2016-08-24 ENCOUNTER — Ambulatory Visit (INDEPENDENT_AMBULATORY_CARE_PROVIDER_SITE_OTHER): Payer: Medicare Other | Admitting: Diagnostic Neuroimaging

## 2016-08-24 ENCOUNTER — Encounter: Payer: Self-pay | Admitting: Diagnostic Neuroimaging

## 2016-08-24 VITALS — BP 102/50 | HR 54 | Wt 116.0 lb

## 2016-08-24 DIAGNOSIS — G43009 Migraine without aura, not intractable, without status migrainosus: Secondary | ICD-10-CM | POA: Diagnosis not present

## 2016-08-24 DIAGNOSIS — G44209 Tension-type headache, unspecified, not intractable: Secondary | ICD-10-CM | POA: Diagnosis not present

## 2016-08-24 MED ORDER — PROPRANOLOL HCL 40 MG PO TABS: 40.0000 mg | ORAL_TABLET | Freq: Two times a day (BID) | ORAL | 4 refills | Status: DC

## 2016-08-24 NOTE — Patient Instructions (Signed)
-   continue current propranolol

## 2016-08-24 NOTE — Progress Notes (Signed)
[facility]  PATIENT: Theresa Martin DOB: 19-Jul-1955  REFERRING CLINICIAN: Tabori HISTORY FROM: patient REASON FOR VISIT: follow up   HISTORICAL  CHIEF COMPLAINT:  Chief Complaint  Patient presents with  . Migraine    rm 6, "avg headaches/migraines 2-3 month"  . Follow-up    6 month    HISTORY OF PRESENT ILLNESS:   UPDATE 08/24/16: Since last visit, avg 2-3 HA per month. Tolerating meds. No new issues. Mood stable. Weight improved.   UPDATE 02/23/16: Since last visit has ~ 1-5 HA per month. Tolerating propranolol + OTC tylenol/aleve for HA mgmt.   UPDATE 08/19/15: Since last visit, avg 1-10 HA per month. Usually with stress. Some more wt gain noted (~10lbs). Also had a a laser eye procedure for angle closure glaucoma, now improved.  UPDATE 04/15/15: Since last visit, doing well. No more migraine since 02/28/15. Propranolol is helping prevent HA. Mood stable.  UPDATE 01/13/15: Since last visit, having more headaches, and more fluid retention. Now being tapered off gabapentin by psychiatry due to side effects.   UPDATE 09/02/14: Doing well. Avg 3-6 days HA per month. Some HA are mild, some are severe. Taking gabapentin 276m TID. Works out every day at [employer] Overall mood is better as well.  UPDATE 05/17/14: Since last visit, patient had some psychiatry issues, was managed by behavioral health, and her headaches significant improved. She was doing well for several years and did not follow-up in our clinic. Patient here with her father today for this visit. Patient's father notes that her headaches seem to be significantly associated with her stress levels. In the last few months her stress levels have increased significantly. Her stress levels related to her ex-husband and her daughters, and some family issues. Patient having intermittent, almost daily left-sided headaches, with numbness and tingling in the left scalp, typically in the evening when she is  home. During the daytime patient stays active, works out several times a week, and does better. Patient had been on gabapentin 100 mg at bedtime for several years. Her psychiatrist increased this to 2 and then 3 capsules at bedtime. When she did 3 capsules at bedtime, her headache type symptoms paradoxical he worsened. She then went back to taking one capsule at bedtime. Strangely, she reports that she was told she was able to take this medication (gabapentin) as needed as well and for the last few weeks has been taking one capsule at bedtime, followed by 1-2 capsules every hour, throughout the night, taking up to 10-14 capsules in a 12 hour period.   PRIOR HPI (11/04/08 - 06/03/09, VRP): 61year old right-handed female with history of high blood pressure, depression and anxiety presenting for evaluation of chronic headaches and abnormal MRI scan.  On October 26, 2008 the patient presented to the emergency room for right-sided chest pain and was diagnosed with pneumonia.   At this time her headaches worsened in severity.  Upon review of prior MRIs demonstrating microvascular gliosis, patient was referred to our neurology clinic for futher evaluation. Patient's headaches began in 2005 consisting of a "dull pressure like sensation" over the top of her head. Occasionally these are associated with mild nausea without vomiting as well as intermittent left facial numbness.  Patient had dull nagging low level headaches on a daily basis with daily flareups involving a hot sensation over her scalp.  Sometimes this sensation starts as posterior neck pain that moves up the back of her head. She was initially evaluated with MRI  scans in 2005 and 2006 and started on topiramate 100 mg at bedtime.   In addition she was taking Advil and Tylenol over-the-counter almost a daily basis.   During flareups the patient would have to lay down in place ice packs over her eyes and the top of her head.   Patient's headaches are aggravated by lack  of sleep or stress. No food triggers noted.  Her other physicians decided to taper her off of NSAIDs, tylenol and also her topiramate. Patient had MRI of the head and neck, found to have some nonspecific white matter lesions. Patient had lumbar puncture and additional blood testing, but multiple sclerosis was ruled out. Patient was treated with several occipital nerve blocks with good results.   REVIEW OF SYSTEMS: Full 14 system review of systems performed and negative except for: depression anxiety constipation blurred vision runny nose light sens drooling.  ALLERGIES: No Known Allergies  HOME MEDICATIONS: Outpatient Medications Prior to Visit  Medication Sig Dispense Refill  . acetaminophen (TYLENOL) 325 MG tablet Take 650 mg by mouth every 6 (six) hours as needed.    Marland Kitchen acyclovir (ZOVIRAX) 800 MG tablet     . atorvastatin (LIPITOR) 20 MG tablet TAKE 1 TABLET (20 MG TOTAL) BY MOUTH DAILY. 30 tablet 6  . Calcium-Vitamin D-Vitamin K (CALCIUM + D + K PO) Take 2 each by mouth daily. Calcium 553m- Vitamin D 1000iu- K 440m   . clonazePAM (KLONOPIN) 0.5 MG tablet Take 0.25 mg by mouth at bedtime.   2  . escitalopram (LEXAPRO) 10 MG tablet Take 10 mg by mouth daily.     . Glucosamine-Chondroitin (MOVE FREE PO) Take 1 tablet by mouth daily.    . Marland Kitchenbuprofen (ADVIL,MOTRIN) 200 MG tablet Take 200 mg by mouth every 6 (six) hours as needed.    . Marland Kitchenetoconazole-Hydrocortisone 2 & 1 % KIT Apply topically as needed. Cracks on mouth    . lamoTRIgine (LAMICTAL) 100 MG tablet TAKE 10054mn the morning and 200 mg by mouth before bed.    . Melatonin 3 MG CAPS Take 1 capsule by mouth at bedtime.    . Naproxen Sodium (ALEVE) 220 MG CAPS As needed for headache 60 each   . propranolol (INDERAL) 40 MG tablet Take 1 tablet (40 mg total) by mouth 2 (two) times daily. 180 tablet 4  . QUEtiapine (SEROQUEL XR) 300 MG 24 hr tablet Take 300 mg by mouth daily.     . traZODone (DESYREL) 50 MG tablet     .  triamterene-hydrochlorothiazide (MAXZIDE-25) 37.5-25 MG tablet TAKE 1 TABLET BY MOUTH DAILY. 30 tablet 6   No facility-administered medications prior to visit.     PAST MEDICAL HISTORY: Past Medical History:  Diagnosis Date  . Allergy   . Anxiety   . Depression   . Hyperlipidemia   . Hypertension   . Kidney stones   . Persistent headaches   . Pneumonia   . Shingles outbreak 02/2014    PAST SURGICAL HISTORY: Past Surgical History:  Procedure Laterality Date  . CARDIOVASCULAR STRESS TEST  02/2009   treadmill stress test: Low risk  [place]     laproscopy for infertility  . EYE SURGERY Bilateral    laser-correct opening b/tn cornea and iris  . MOLE REMOVAL  2017   x 2 moles    FAMILY HISTORY: Family History  Problem Relation Age of Onset  . Hypertension Mother   . Coronary artery  disease Mother   . Heart failure Mother   . Hypertension Father   . Liver disease Sister        liver failure  . Arrhythmia Brother   . Hypertension Brother   . Cancer Neg Hx        colon     SOCIAL HISTORY:  Social History   Social History  . Marital status: Legally Separated    Spouse name: N/A  . Number of children: 2  . Years of education: N/A   Occupational History  . Teacher assistant    Social History Main Topics  . Smoking status: Never Smoker  . Smokeless tobacco: Never Used  . Alcohol use No     Comment: rare  . Drug use: No  . Sexual activity: Not on file   Other Topics Concern  . Not on file   Social History Narrative   Regular exercise- yes, daily running, elliptical    Diet: fruits and veggies, water    No caffeine     PHYSICAL EXAM  Vitals:   08/24/16 1353  BP: (!) 102/50  Pulse: (!) 54  Weight: 116 lb (52.6 kg)   Wt Readings from Last 3 Encounters:  08/24/16 116 lb (52.6 kg)  06/03/16 116 lb 8 oz (52.8 kg)  02/23/16 124 lb (56.2 kg)   Body mass index is 21.92 kg/m.  No exam data  present  MMSE - Mini Mental State Exam 06/03/2016  Orientation to time 5  Orientation to Place 5  Registration 3  Attention/ Calculation 5  Recall 3  Language- name 2 objects 2  Language- repeat 1  Language- follow 3 step command 3  Language- read & follow direction 1  Write a sentence 1  Copy design 1  Total score 30    GENERAL EXAM: Patient is in no distress; well developed, nourished and groomed; neck is supple  CARDIOVASCULAR: Regular rate and rhythm, no murmurs, no carotid bruits; BRADYCARDIA  NEUROLOGIC: MENTAL STATUS: awake, alert, language fluent, comprehension intact, naming intact, fund of knowledge appropriate CRANIAL NERVE: pupils equal and reactive to light, visual fields full to confrontation, extraocular muscles intact, no nystagmus, facial sensation and strength symmetric, hearing intact, palate elevates symmetrically, uvula midline, shoulder shrug symmetric, tongue midline. MOTOR: normal bulk and tone, full strength in the BUE, BLE SENSORY: normal and symmetric to light touch, temperature, vibration COORDINATION: finger-nose-finger, fine finger movements normal REFLEXES: deep tendon reflexes present and symmetric GAIT/STATION: narrow based gait; romberg is negative    DIAGNOSTIC DATA (LABS, IMAGING, TESTING) - I reviewed patient records, labs, notes, testing and imaging myself where available.  Lab Results  Component Value Date   WBC 4.9 06/03/2016   HGB 13.1 06/03/2016   HCT 38.6 06/03/2016   MCV 93.1 06/03/2016   PLT 234.0 06/03/2016      Component Value Date/Time   NA 139 06/03/2016 1504   K 4.3 06/03/2016 1504   CL 101 06/03/2016 1504   CO2 33 (H) 06/03/2016 1504   GLUCOSE 83 06/03/2016 1504   BUN 18 06/03/2016 1504   CREATININE 0.92 06/03/2016 1504   CREATININE 0.94 12/03/2015 1505   CALCIUM 9.7 06/03/2016 1504   PROT 6.9 06/03/2016 1504   ALBUMIN 4.5 06/03/2016 1504   AST 30 06/03/2016 1504   ALT 28 06/03/2016 1504   ALKPHOS 84  06/03/2016 1504   BILITOT 0.4 06/03/2016 1504   GFRNONAA 80 (L) 05/07/2013 1525   GFRAA >90 05/07/2013 1525   Lab Results  Component  Value Date   CHOL 167 06/03/2016   HDL 50.90 06/03/2016   LDLCALC 97 06/03/2016   TRIG 95.0 06/03/2016   CHOLHDL 3 06/03/2016   Lab Results  Component Value Date   HGBA1C 5.7 10/18/2011   Lab Results  Component Value Date   AUQJFHLK56 256 01/16/2010   Lab Results  Component Value Date   TSH 1.03 06/03/2016    11/13/08 LP - opening pressure 7cm H2O; WBC 1, RBC 0, glucose 62, protein 26, OCB (2 is CSF, not seen in serum), IgG index 0.5 (normal), lyme PCR neg, EBV PCR neg  11/13/08 VEP - normal  11/08/08 MRI cervical - normal  11/08/08 MRI brain (with and without contrast) - multiple supratentorial, periventricular and juxtacortical white matter lesions which may represent chronic demyelinating plaques or perivascular gliosis.  No abnormal enhancement on postcontrast views.     ASSESSMENT AND PLAN  61 y.o. year old female here with mixed tension and migraine headaches, left occipital neuralgia, and longer standing significant depression/anxiety. Some headaches have occipital neuralgia type features. Symptoms seem to be worse with stress and external factors.   Tried gabapentin with some benefit, but then was having more HA so switched to propranolol.   Some mild weight gain in last few months. Could be related to seroquel, and less likely propranolol.    Dx:  Migraine without aura and without status migrainosus, not intractable  Tension headache    PLAN: I spent 15 minutes of face to face time with patient. Greater than 50% of time was spent in counseling and coordination of care with patient. In summary we discussed:  - continue propranolol 37m BID - continue aleve and tylenol prn migraine - continue exercise, activity, nutrition strategies  Meds ordered this encounter  Medications  . propranolol (INDERAL) 40 MG tablet    Sig:  Take 1 tablet (40 mg total) by mouth 2 (two) times daily.    Dispense:  180 tablet    Refill:  4   Return in about 1 year (around 08/24/2017).     VPenni Bombard MD 53/89/3734 22:87PM Certified in Neurology, Neurophysiology and Neuroimaging  [facility] [address] [place] [phone]

## 2016-09-10 DIAGNOSIS — F3181 Bipolar II disorder: Secondary | ICD-10-CM | POA: Diagnosis not present

## 2016-09-10 DIAGNOSIS — F411 Generalized anxiety disorder: Secondary | ICD-10-CM | POA: Diagnosis not present

## 2016-10-06 DIAGNOSIS — L821 Other seborrheic keratosis: Secondary | ICD-10-CM | POA: Diagnosis not present

## 2016-10-06 DIAGNOSIS — Z85820 Personal history of malignant melanoma of skin: Secondary | ICD-10-CM | POA: Diagnosis not present

## 2016-10-06 DIAGNOSIS — Z86018 Personal history of other benign neoplasm: Secondary | ICD-10-CM | POA: Diagnosis not present

## 2016-10-07 ENCOUNTER — Telehealth: Payer: Self-pay | Admitting: Family Medicine

## 2016-10-07 NOTE — Telephone Encounter (Signed)
Pt states that she had a deep tissue massage done last Sunday and still feeling sore from this, pt has been using heat and ice and taking OTC meds which is helping with pain, pt just wants to make sure she is doing what is best. Please advise.

## 2016-10-07 NOTE — Telephone Encounter (Signed)
Patient notified of PCP recommendations and is agreement and expresses an understanding.  

## 2016-10-07 NOTE — Telephone Encounter (Signed)
Doing everything correctly.  Some of those deep tissue massages are aggressive and painful and can leave bruises and take a few weeks to improve.  Hang in there!

## 2016-10-07 NOTE — Telephone Encounter (Signed)
Please advise 

## 2016-10-11 DIAGNOSIS — F3181 Bipolar II disorder: Secondary | ICD-10-CM | POA: Diagnosis not present

## 2016-10-11 DIAGNOSIS — F411 Generalized anxiety disorder: Secondary | ICD-10-CM | POA: Diagnosis not present

## 2016-10-14 ENCOUNTER — Other Ambulatory Visit: Payer: Self-pay | Admitting: Diagnostic Neuroimaging

## 2016-10-22 ENCOUNTER — Telehealth: Payer: Self-pay | Admitting: Family Medicine

## 2016-10-22 NOTE — Telephone Encounter (Signed)
Pt asking if she would need to get the new shingle vaccine, pt states that she has had the other shot and not sure if she needs to have this one.

## 2016-10-22 NOTE — Telephone Encounter (Signed)
Called and left a detailed message to inform pt that since she has medicare she would need to go the pharmacy to receive this. Even though she had the previous one it is recommended to have the new one as well.

## 2016-11-01 DIAGNOSIS — F411 Generalized anxiety disorder: Secondary | ICD-10-CM | POA: Diagnosis not present

## 2016-11-01 DIAGNOSIS — F3181 Bipolar II disorder: Secondary | ICD-10-CM | POA: Diagnosis not present

## 2016-11-01 DIAGNOSIS — Z79899 Other long term (current) drug therapy: Secondary | ICD-10-CM | POA: Diagnosis not present

## 2016-11-16 NOTE — Telephone Encounter (Signed)
Pt states that she is still feeling sore and bruised from deep tissue massage and asking what should she do at this point. Please advise.

## 2016-11-17 NOTE — Telephone Encounter (Signed)
Pt has been scheduled.  °

## 2016-11-17 NOTE — Telephone Encounter (Signed)
Pt would need an appt, to discuss

## 2016-11-22 ENCOUNTER — Ambulatory Visit: Payer: Medicare Other | Admitting: Family Medicine

## 2016-11-26 ENCOUNTER — Encounter: Payer: Self-pay | Admitting: Family Medicine

## 2016-11-26 ENCOUNTER — Ambulatory Visit (INDEPENDENT_AMBULATORY_CARE_PROVIDER_SITE_OTHER): Payer: Medicare Other | Admitting: Family Medicine

## 2016-11-26 VITALS — BP 108/60 | HR 60 | Temp 98.6°F | Resp 16 | Ht 61.0 in | Wt 116.4 lb

## 2016-11-26 DIAGNOSIS — E785 Hyperlipidemia, unspecified: Secondary | ICD-10-CM

## 2016-11-26 DIAGNOSIS — M25511 Pain in right shoulder: Secondary | ICD-10-CM

## 2016-11-26 DIAGNOSIS — I10 Essential (primary) hypertension: Secondary | ICD-10-CM

## 2016-11-26 LAB — BASIC METABOLIC PANEL
BUN: 15 mg/dL (ref 6–23)
CHLORIDE: 101 meq/L (ref 96–112)
CO2: 33 meq/L — AB (ref 19–32)
CREATININE: 0.87 mg/dL (ref 0.40–1.20)
Calcium: 9.1 mg/dL (ref 8.4–10.5)
GFR: 70.15 mL/min (ref >60.00)
GLUCOSE: 88 mg/dL (ref 70–99)
POTASSIUM: 3.9 meq/L (ref 3.5–5.1)
Sodium: 139 mEq/L (ref 135–145)

## 2016-11-26 LAB — CBC WITH DIFFERENTIAL/PLATELET
BASOS PCT: 0.6 % (ref 0.0–3.0)
Basophils Absolute: 0 10*3/uL (ref 0.0–0.1)
EOS ABS: 0.1 10*3/uL (ref 0.0–0.7)
Eosinophils Relative: 1.2 % (ref 0.0–5.0)
HEMATOCRIT: 37.2 % (ref 36.0–46.0)
HEMOGLOBIN: 12.4 g/dL (ref 12.0–15.0)
LYMPHS PCT: 24.8 % (ref 12.0–46.0)
Lymphs Abs: 1.4 10*3/uL (ref 0.7–4.0)
MCHC: 33.3 g/dL (ref 30.0–36.0)
MCV: 93.8 fl (ref 78.0–100.0)
MONO ABS: 0.5 10*3/uL (ref 0.1–1.0)
Monocytes Relative: 9.3 % (ref 3.0–12.0)
Neutro Abs: 3.5 10*3/uL (ref 1.4–7.7)
Neutrophils Relative %: 64.1 % (ref 43.0–77.0)
PLATELETS: 244 10*3/uL (ref 150.0–400.0)
RBC: 3.96 Mil/uL (ref 3.87–5.11)
RDW: 12.2 % (ref 11.5–15.5)
WBC: 5.5 10*3/uL (ref 4.0–10.5)

## 2016-11-26 LAB — LIPID PANEL
CHOLESTEROL: 165 mg/dL (ref 0–200)
HDL: 56.5 mg/dL (ref >39.00)
LDL Cholesterol: 95 mg/dL (ref 0–99)
NonHDL: 108.21
TRIGLYCERIDES: 67 mg/dL (ref 0.0–149.0)
Total CHOL/HDL Ratio: 3
VLDL: 13.4 mg/dL (ref 0.0–40.0)

## 2016-11-26 LAB — HEPATIC FUNCTION PANEL
ALT: 40 U/L — AB (ref 0–35)
AST: 32 U/L (ref 0–37)
Albumin: 4.2 g/dL (ref 3.5–5.2)
Alkaline Phosphatase: 82 U/L (ref 39–117)
Bilirubin, Direct: 0.1 mg/dL (ref 0.0–0.3)
TOTAL PROTEIN: 6.5 g/dL (ref 6.0–8.3)
Total Bilirubin: 0.4 mg/dL (ref 0.2–1.2)

## 2016-11-26 MED ORDER — MELOXICAM 15 MG PO TABS: 15.0000 mg | ORAL_TABLET | Freq: Every day | ORAL | 1 refills | Status: DC

## 2016-11-26 NOTE — Progress Notes (Signed)
Pre visit review using our clinic review tool, if applicable. No additional management support is needed unless otherwise documented below in the visit note. 

## 2016-11-26 NOTE — Assessment & Plan Note (Signed)
Chronic problem.  Excellent BP control today.  Asymptomatic.  Check labs.  No anticipated med changes.  Will follow.

## 2016-11-26 NOTE — Patient Instructions (Signed)
Follow up in 6 months to recheck BP and cholesterol We'll notify you of your lab results and make any changes if needed Start the Meloxicam once daily for pain and inflammation- take w/ food ICE! We'll call you with your sports med appt for the shoulder pain Call with any questions or concerns Happy Labor Day!!!

## 2016-11-26 NOTE — Assessment & Plan Note (Signed)
Chronic problem.  Tolerating statin w/o difficulty.  Check labs.  Adjust meds prn  

## 2016-11-26 NOTE — Progress Notes (Signed)
   Subjective:    Patient ID: Theresa Martin, female    DOB: 08/26/1955, 61 y.o.   MRN: [id number]  HPI HTN- chronic problem, on Triamterene HCTZ 37.5/25mg  daily and Propranolol 40mg  BID.  Excellent control today.  No CP, SOB, HAs, visual changes, edema.  Hyperlipidemia- chronic problem, on Lipitor 20mg  daily.  No abd pain, N/V.  R shoulder pain- pt picked up granddaughter in her car seat and knew instantly that she hurt her shoulder.  Had deep tissue massage to try and help w/ pain but developed worsening soreness and bruising.  No improvement w/ tylenol or ibuprofen.  Pain is anterior and radiates through to her back.  No pain w/ overhead motion.  Some discomfort w/ reaching across.  No pain w/ internal rotation.  Pain is intermittent.  R hand dominant.     Review of Systems For ROS see HPI     Objective:   Physical Exam  Constitutional: She is oriented to person, place, and time. She appears well-developed and well-nourished. No distress.  HENT:  Head: Normocephalic and atraumatic.  Eyes: Pupils are equal, round, and reactive to light. Conjunctivae and EOM are normal.  Neck: Normal range of motion. Neck supple. No thyromegaly present.  Cardiovascular: Normal rate, regular rhythm, normal heart sounds and intact distal pulses.   No murmur heard. Pulmonary/Chest: Effort normal and breath sounds normal. No respiratory distress.  Abdominal: Soft. She exhibits no distension. There is no tenderness.  Musculoskeletal: She exhibits tenderness (mild TTP over R head of biceps tendon). She exhibits no edema.  (-) impingement signs Full ROM- full forward flexion, abduction, interna/external rotation of R shoulder  Lymphadenopathy:    She has no cervical adenopathy.  Neurological: She is alert and oriented to person, place, and time.  Skin: Skin is warm and dry.  Psychiatric: She has a normal mood and affect. Her behavior is normal.  Vitals reviewed.         Assessment & Plan:  R  shoulder pain- new.  Pt injured her R shoulder when lifting granddaughter's car seat a few weeks ago.  No bony abnormality.  Full ROM.  (-) impingement signs.  Start scheduled NSAIDs.  Refer to Sports Med.  Pt expressed understanding and is in agreement w/ plan.

## 2016-11-30 ENCOUNTER — Encounter: Payer: Self-pay | Admitting: Family Medicine

## 2016-11-30 ENCOUNTER — Encounter: Payer: Self-pay | Admitting: General Practice

## 2016-12-01 ENCOUNTER — Ambulatory Visit (INDEPENDENT_AMBULATORY_CARE_PROVIDER_SITE_OTHER): Payer: Medicare Other | Admitting: Sports Medicine

## 2016-12-01 ENCOUNTER — Encounter: Payer: Self-pay | Admitting: Sports Medicine

## 2016-12-01 ENCOUNTER — Ambulatory Visit: Payer: Self-pay

## 2016-12-01 ENCOUNTER — Ambulatory Visit: Payer: Medicare Other | Admitting: Family Medicine

## 2016-12-01 VITALS — BP 110/78 | HR 54 | Ht 61.0 in | Wt 116.0 lb

## 2016-12-01 DIAGNOSIS — M25511 Pain in right shoulder: Secondary | ICD-10-CM | POA: Diagnosis not present

## 2016-12-01 DIAGNOSIS — M67911 Unspecified disorder of synovium and tendon, right shoulder: Secondary | ICD-10-CM | POA: Diagnosis not present

## 2016-12-01 DIAGNOSIS — G43009 Migraine without aura, not intractable, without status migrainosus: Secondary | ICD-10-CM

## 2016-12-01 NOTE — Progress Notes (Addendum)
OFFICE VISIT NOTE Theresa Martin, [place] at [facility] [phone]  Theresa Martin - 61 y.o. female MRN [id number]  Date of birth: [birth year]  Visit Date: 12/01/2016  PCP: [name], MD   Referred by: [name], MD  Burlene Arnt, CMA acting as scribe for Dr. Paulla Fore.  SUBJECTIVE:   Chief Complaint  Patient presents with  . New Patient (Initial Visit)    RT shoulder pain   HPI: As below and per problem based documentation when appropriate.  Theresa Martin is a new patient presenting today with complaint of RT shoulder pain. Pain is located on the anterior aspect of the shoulder and radiate toawrd the back.  Pain started after she reached to pick her grand-daughter's car seat up and place it in an SUV. Pain has been present x 3 mos. She has previous injury to the Rt side check wall after pulling down a tale gate.   The pain is described as constant aching and is rated as 5/10 currently but 8/10 when injury initially occured.  Worsened with picking up heavy objects. Pain is worse when arm is stretched parallel to the floor and when reaching down and back. She has noticed increased pain when she is in a cold place.  Improves with rest and placing a pillow between her arm and body.  Therapies tried include : deep tissue massage which caused worsening soreness and bruising. She has tried Tylenol and Ibuprofen with minimal relief.   Other associated symptoms include: Pain radiates into the neck and upper back. No radiation of pain down the arm. She has noticed some weakness in the right arm but on decrease in grip strength.   No recent xray    Review of Systems  Constitutional: Negative for chills and fever.  Respiratory: Positive for shortness of breath. Negative for wheezing.   Cardiovascular: Positive for palpitations. Negative for chest pain.  Musculoskeletal: Positive for joint pain and myalgias.  Neurological:  Positive for tingling (LT arm/finger) and headaches. Negative for dizziness.  Endo/Heme/Allergies: Bruises/bleeds easily.    Otherwise per HPI.  HISTORY & PERTINENT PRIOR DATA:   She reports that she has never smoked. She has never used smokeless tobacco. No results for input(s): HGBA1C, LABURIC in the last 8760 hours. Medications & Allergies reviewed per EMR Patient Active Problem List   Diagnosis Date Noted  . Tendinopathy of rotator cuff 12/20/2016  . Angle-closure glaucoma, severe stage 08/19/2015  . Migraine without aura and without status migrainosus, not intractable 01/13/2015  . High risk medications (not anticoagulants) long-term use 09/26/2014  . Nipple symptom or sign in female 05/10/2014  . Breast signs and symptoms 02/08/2014  . Acute gastroenteritis 05/07/2013  . Cerumen impaction 05/07/2013  . Routine general medical examination at a health care facility 07/27/2012  . Hyperlipidemia 09/29/2010  . URETEROPELVIC JUNCTION OBSTRUCTION, CONGENITAL 12/15/2009  . CONSTIPATION 12/11/2009  . ABDOMINAL BLOATING 12/11/2009  . KNEE PAIN, BILATERAL 12/19/2008  . CARDIAC MURMUR 11/26/2008  . MAGNETIC RESONANCE IMAGING, BRAIN, ABNORMAL 10/29/2008  . VENEREAL WART 10/23/2008  . Major depressive disorder, recurrent episode ([place]) 10/23/2008  . COMMON MIGRAINE 10/23/2008  . Essential hypertension 10/23/2008  . ALLERGIC RHINITIS 10/23/2008  . ASTHMA, INTERMITTENT, MILD 10/23/2008  . RENAL CALCULUS, HX OF 10/23/2008   Past Medical History:  Diagnosis Date  . Allergy   . Anxiety   . Depression   . Hyperlipidemia   . Hypertension   . Kidney stones   .  Persistent headaches   . Pneumonia   . Shingles outbreak 02/2014   Family History  Problem Relation Age of Onset  . Hypertension Mother   . Coronary artery disease Mother   . Heart failure Mother   . Hypertension Father   . Liver disease Sister        liver failure  . Arrhythmia Brother   . Hypertension Brother   . Cancer  Neg Hx        colon    Past Surgical History:  Procedure Laterality Date  . CARDIOVASCULAR STRESS TEST  02/2009   treadmill stress test: Low risk  . [place]  . EXPLORATORY LAPAROTOMY     laproscopy for infertility  . EYE SURGERY Bilateral    laser-correct opening b/tn cornea and iris  . MOLE REMOVAL  2017   x 2 moles   Social History   Occupational History  . Teacher assistant    Social History Main Topics  . Smoking status: Never Smoker  . Smokeless tobacco: Never Used  . Alcohol use No     Comment: rare  . Drug use: No  . Sexual activity: Not on file    OBJECTIVE:  VS:  HT:5\' 1"  (154.9 cm)   WT:116 lb (52.6 kg)  BMI:21.93    BP:110/78  HR:(!) 54bpm  TEMP: ( )  RESP:95 % EXAM: Findings:  WDWN, NAD, Non-toxic appearing Alert & appropriately interactive Not depressed or anxious appearing No increased work of breathing. Pupils are equal. EOM intact without nystagmus No clubbing or cyanosis of the extremities appreciated No significant rashes/lesions/ulcerations overlying the examined area. Radial pulses 2+/4.  No significant generalized UE edema. Sensation intact to light touch in upper extremities.  Right Shoulder Exam: Normal alignment, Normal Contours No overlying erythema/ecchymosis. No pain or crepitation with axial loading and circumduction TTP over: Anterior aspect of the shoulder which is minimal.  No focal bony tenderness. No TTP over: Biceps tendon or proximal clavicle. Internal Rotation: Normal External Rotation: Slight pain with external rotation Empty can: Pain and weakness Hawkins: Small amount of pain but minimal Neers: No focal pain Speeds: Mild pain but minimal O'Brien's: No pain    Procedures & Images Obtained: LIMITED MSK ULTRASOUND OF RIGHT SHOULDER Images were obtained and interpreted by myself, Teresa Coombs, DO  Images have been saved and stored to PACS system. Images obtained on: GE S7 Ultrasound  machine  FINDINGS:  Biceps Tendon: Normal Pec Major Insertion: Normal Subscapularis Tendon: Normal Supraspinatus Tendon: Markedly thickened with hypoechoic change without increased neovascularity and a small amount of bursal swelling.  mild amount of impingement appreciated Infraspinatus/Teres Minor Tendon: Normal AC Joint: Slight degenerative spurring.  No pain with sono palpation JOINT: No significant GH spurring appreciated  LABRUM: Not evaluated   IMPRESSION:  1. Chronic tendinopathy of the right supraspinatus without overt tearing and only a very small amount of minimal bursitis   ASSESSMENT & PLAN:     ICD-10-CM   1. Right shoulder pain, unspecified chronicity M25.511 [place] LIMITED JOINT SPACE STRUCTURES UP RIGHT(NO LINKED CHARGES)    Ambulatory referral to Physical Therapy  2. Tendinopathy of right rotator cuff M67.911   3. Migraine without aura and without status migrainosus, not intractable G43.009    ================================================================= Tendinopathy of rotator cuff Tendinopathy appreciated on MSK ultrasound however she does have a history of fairly significant headaches is not a candidate for nitroglycerin protocol.  We will begin with formal physical therapy and follow-up in 6 weeks to  ensure clinical improvement/resolution.  Encouraged use of the shoulder but should avoid any significant exacerbating activities.  =================================================================  Follow-up: Return in about 6 weeks (around 01/12/2017) for +++schedule PT visit with Lauren+++.   CMA/ATC served as Education administrator during this visit. History, Physical, and Plan performed by medical provider. Documentation and orders reviewed and attested to.      Teresa Coombs, [facility] Physician

## 2016-12-06 ENCOUNTER — Ambulatory Visit: Payer: Medicare Other | Admitting: Family Medicine

## 2016-12-09 ENCOUNTER — Ambulatory Visit: Payer: Medicare Other

## 2016-12-16 ENCOUNTER — Ambulatory Visit (INDEPENDENT_AMBULATORY_CARE_PROVIDER_SITE_OTHER): Payer: Medicare Other | Admitting: Physical Therapy

## 2016-12-16 DIAGNOSIS — M25511 Pain in right shoulder: Secondary | ICD-10-CM

## 2016-12-17 NOTE — Therapy (Signed)
[facility] [address] [place], [place], [address] Phone: [phone]   Fax:  [phone]  Physical Therapy Note/No Visit  Patient Details  Name: Theresa Martin MRN: [id number] Date of Birth: [birth year] No Data Recorded  Encounter Date: 12/16/2016    Past Medical History:  Diagnosis Date  . Allergy   . Anxiety   . Depression   . Hyperlipidemia   . Hypertension   . Kidney stones   . Persistent headaches   . Pneumonia   . Shingles outbreak 02/2014    Past Surgical History:  Procedure Laterality Date  . CARDIOVASCULAR STRESS TEST  02/2009   treadmill stress test: Low risk  . [place]  . EXPLORATORY LAPAROTOMY     laproscopy for infertility  . EYE SURGERY Bilateral    laser-correct opening b/tn cornea and iris  . MOLE REMOVAL  2017   x 2 moles    There were no vitals filed for this visit.       Subjective Assessment - 12/16/16 1521    Pertinent History Pt was lifting car seat/baby into her car in June. She has not had previous shoulder pain.  She is R handed, has been trying to not use R hand for activity as much. During subjective info, pt states that she lives in [place], and wishes to go to a PT clinic that would be closer to her home if possible. Eval not done today, except for subjective info. Will send referral info to [facility] for pt . Pt in agreement with plan. No treatment/charge for todays visit.    Patient Stated Goals Less pain, gym activities, lifting,    Currently in Pain? Yes   Pain Score 5    Pain Location Shoulder   Pain Orientation Right   Pain Descriptors / Indicators Aching   Pain Type Acute pain   Pain Onset More than a month ago   Pain Frequency Intermittent                Objective measurements completed on examination: See above findings.                           Patient will benefit from skilled therapeutic  intervention in order to improve the following deficits and impairments:     Visit Diagnosis: Right shoulder pain, unspecified chronicity     Problem List Patient Active Problem List   Diagnosis Date Noted  . Angle-closure glaucoma, severe stage 08/19/2015  . Migraine without aura and without status migrainosus, not intractable 01/13/2015  . High risk medications (not anticoagulants) long-term use 09/26/2014  . Nipple symptom or sign in female 05/10/2014  . Breast signs and symptoms 02/08/2014  . Acute gastroenteritis 05/07/2013  . Cerumen impaction 05/07/2013  . Routine general medical examination at a health care facility 07/27/2012  . Hyperlipidemia 09/29/2010  . URETEROPELVIC JUNCTION OBSTRUCTION, CONGENITAL 12/15/2009  . CONSTIPATION 12/11/2009  . ABDOMINAL BLOATING 12/11/2009  . KNEE PAIN, BILATERAL 12/19/2008  . CARDIAC MURMUR 11/26/2008  . MAGNETIC RESONANCE IMAGING, BRAIN, ABNORMAL 10/29/2008  . VENEREAL WART 10/23/2008  . Major depressive disorder, recurrent episode ([place]) 10/23/2008  . COMMON MIGRAINE 10/23/2008  . Essential hypertension 10/23/2008  . ALLERGIC RHINITIS 10/23/2008  . ASTHMA, INTERMITTENT, MILD 10/23/2008  . RENAL CALCULUS, HX OF 10/23/2008    Lyndee Hensen, PT, DPT 12/17/2016, 8:48 AM  [facility] [address]  [place], [place], [address] Phone: [phone]   Fax:  [phone]  Name: ZONIE CRUTCHER MRN: [id number] Date of Birth: 06/13/1955

## 2016-12-20 DIAGNOSIS — F411 Generalized anxiety disorder: Secondary | ICD-10-CM | POA: Diagnosis not present

## 2016-12-20 DIAGNOSIS — F3181 Bipolar II disorder: Secondary | ICD-10-CM | POA: Diagnosis not present

## 2016-12-20 DIAGNOSIS — M67919 Unspecified disorder of synovium and tendon, unspecified shoulder: Secondary | ICD-10-CM | POA: Insufficient documentation

## 2016-12-20 NOTE — Assessment & Plan Note (Signed)
Tendinopathy appreciated on MSK ultrasound however she does have a history of fairly significant headaches is not a candidate for nitroglycerin protocol.  We will begin with formal physical therapy and follow-up in 6 weeks to ensure clinical improvement/resolution.  Encouraged use of the shoulder but should avoid any significant exacerbating activities.

## 2016-12-20 NOTE — Procedures (Signed)
LIMITED MSK ULTRASOUND OF RIGHT SHOULDER Images were obtained and interpreted by myself, Teresa Coombs, DO  Images have been saved and stored to PACS system. Images obtained on: GE S7 Ultrasound machine  FINDINGS:  Biceps Tendon: Normal Pec Major Insertion: Normal Subscapularis Tendon: Normal Supraspinatus Tendon: Markedly thickened with hypoechoic change without increased neovascularity and a small amount of bursal swelling.  mild amount of impingement appreciated Infraspinatus/Teres Minor Tendon: Normal AC Joint: Slight degenerative spurring.  No pain with sono palpation JOINT: No significant GH spurring appreciated  LABRUM: Not evaluated   IMPRESSION:  1. Chronic tendinopathy of the right supraspinatus without overt tearing and only a very small amount of minimal bursitis

## 2016-12-24 ENCOUNTER — Encounter: Payer: Self-pay | Admitting: Sports Medicine

## 2016-12-24 ENCOUNTER — Other Ambulatory Visit: Payer: Self-pay | Admitting: Family Medicine

## 2016-12-30 ENCOUNTER — Other Ambulatory Visit: Payer: Self-pay | Admitting: Family Medicine

## 2016-12-30 DIAGNOSIS — Z1231 Encounter for screening mammogram for malignant neoplasm of breast: Secondary | ICD-10-CM

## 2016-12-31 ENCOUNTER — Telehealth: Payer: Self-pay | Admitting: Family Medicine

## 2016-12-31 NOTE — Telephone Encounter (Signed)
Patient states she is currently taking acyclovir (ZOVIRAX) 800 MG tablet.  Yesterday should have been her last day taking it.  However, she has continued taking it as she is still having symptoms.  She wants to know if pcp agrees with her continuing the medication or should she stop taking it.

## 2017-01-03 DIAGNOSIS — F3181 Bipolar II disorder: Secondary | ICD-10-CM | POA: Diagnosis not present

## 2017-01-03 DIAGNOSIS — F411 Generalized anxiety disorder: Secondary | ICD-10-CM | POA: Diagnosis not present

## 2017-01-03 NOTE — Telephone Encounter (Signed)
Please advise 

## 2017-01-03 NOTE — Telephone Encounter (Signed)
Patient states that she also called the Dermatologist that she saw and that gave her the medication.  Dermatologist made her an appointment for reevaluation with them tomorrow morning.  If they cannot help, or if they tell her to just follow-up with PCP she will call us back to schedule.

## 2017-01-03 NOTE — Telephone Encounter (Signed)
She would need assessment in office as I am not convinced she has shingles which would explain while symptoms continue despite use of antiviral medication.

## 2017-01-03 NOTE — Telephone Encounter (Signed)
Reviewed Chart in PCP absence. Acyclovir is listed as a historical medication. What is she taking for? What current symptoms is she having so I can help guide next steps.

## 2017-01-03 NOTE — Telephone Encounter (Signed)
Pt. Has the shingles pain, but no breakout.  She has a history of shingles so the dermatologist gave her the medication.  She is on the 11th day of medication and still having the pain, but still no breakout.    She has enough tablets for today, but since she is still having the pain she wondered if she still needed to continue, if so, she is asking for a refill.

## 2017-01-03 NOTE — Telephone Encounter (Signed)
Called patient and LMOVM to return call.     

## 2017-01-04 ENCOUNTER — Ambulatory Visit: Payer: Medicare Other | Admitting: Physical Therapy

## 2017-01-04 DIAGNOSIS — B009 Herpesviral infection, unspecified: Secondary | ICD-10-CM | POA: Diagnosis not present

## 2017-01-04 DIAGNOSIS — L2081 Atopic neurodermatitis: Secondary | ICD-10-CM | POA: Diagnosis not present

## 2017-01-05 ENCOUNTER — Ambulatory Visit (INDEPENDENT_AMBULATORY_CARE_PROVIDER_SITE_OTHER): Payer: Medicare Other | Admitting: Physician Assistant

## 2017-01-05 ENCOUNTER — Encounter: Payer: Self-pay | Admitting: Physician Assistant

## 2017-01-05 VITALS — BP 130/70 | HR 54 | Temp 98.3°F | Resp 14 | Ht 61.0 in | Wt 116.0 lb

## 2017-01-05 DIAGNOSIS — B0229 Other postherpetic nervous system involvement: Secondary | ICD-10-CM

## 2017-01-05 MED ORDER — GABAPENTIN 100 MG PO CAPS: 100.0000 mg | ORAL_CAPSULE | Freq: Three times a day (TID) | ORAL | 0 refills | Status: DC

## 2017-01-05 NOTE — Progress Notes (Signed)
Pre visit review using our clinic review tool, if applicable. No additional management support is needed unless otherwise documented below in the visit note. 

## 2017-01-05 NOTE — Progress Notes (Signed)
Patient presents to clinic today c/o pain and burning of skin of R torso and lower back. Endorses shingles outbreak over a week ago that was treated with Acycylovir. Endorses rash has dried up. Denies new lesions. Denies fever, chills, malaise or fatigue.  Past Medical History:  Diagnosis Date  . Allergy   . Anxiety   . Depression   . Hyperlipidemia   . Hypertension   . Kidney stones   . Persistent headaches   . Pneumonia   . Shingles outbreak 02/2014    Current Outpatient Prescriptions on File Prior to Visit  Medication Sig Dispense Refill  . atorvastatin (LIPITOR) 20 MG tablet TAKE 1 TABLET (20 MG TOTAL) BY MOUTH DAILY. 30 tablet 6  . Calcium-Vitamin D-Vitamin K (CALCIUM + D + K PO) Take 2 each by mouth daily. Calcium 530m- Vitamin D 1000iu- K 449m   . clonazePAM (KLONOPIN) 0.5 MG tablet Take 0.25 mg by mouth at bedtime.   2  . escitalopram (LEXAPRO) 10 MG tablet Take 10 mg by mouth daily.     . Glucosamine-Chondroitin (MOVE FREE PO) Take 1 tablet by mouth daily.    . Marland Kitchenbuprofen (ADVIL,MOTRIN) 200 MG tablet Take 200 mg by mouth every 6 (six) hours as needed.    . Marland Kitchenetoconazole-Hydrocortisone 2 & 1 % KIT Apply topically as needed. Cracks on mouth    . lamoTRIgine (LAMICTAL) 100 MG tablet TAKE 10078mn the morning and 200 mg by mouth before bed.    . Melatonin 3 MG CAPS Take 1 capsule by mouth at bedtime.    . Naproxen Sodium (ALEVE) 220 MG CAPS As needed for headache 60 each   . propranolol (INDERAL) 40 MG tablet Take 1 tablet (40 mg total) by mouth 2 (two) times daily. 180 tablet 4  . QUEtiapine (SEROQUEL XR) 300 MG 24 hr tablet Take 300 mg by mouth daily.     . traZODone (DESYREL) 50 MG tablet     . triamterene-hydrochlorothiazide (MAXZIDE-25) 37.5-25 MG tablet TAKE 1 TABLET BY MOUTH DAILY. 30 tablet 6   No current facility-administered medications on file prior to visit.     No Known Allergies  Family History  Problem Relation Age of Onset  . Hypertension Mother   .  Coronary artery disease Mother   . Heart failure Mother   . Hypertension Father   . Liver disease Sister        liver failure  . Arrhythmia Brother   . Hypertension Brother   . Cancer Neg Hx        colon     Social History   Social History  . Marital status: Legally Separated    Spouse name: N/A  . Number of children: 2  . Years of education: N/A   Occupational History  . Teacher assistant    Social History Main Topics  . Smoking status: Never Smoker  . Smokeless tobacco: Never Used  . Alcohol use No     Comment: rare  . Drug use: No  . Sexual activity: Not Asked   Other Topics Concern  . None   Social History Narrative   Regular exercise- yes, daily running, elliptical    Diet: fruits and veggies, water    No caffeine    Review of Systems - See HPI.  All other ROS are negative.  BP 130/70   Pulse (!) 54   Temp 98.3 F (36.8 C) (Oral)   Resp 14   Ht _0  (1.549 m)  Wt 116 lb (52.6 kg)   SpO2 98%   BMI 21.92 kg/m   Physical Exam  Constitutional: She is oriented to person, place, and time and well-developed, well-nourished, and in no distress.  HENT:  Head: Normocephalic and atraumatic.  Eyes: Conjunctivae are normal.  Neck: Neck supple.  Cardiovascular: Normal rate, regular rhythm, normal heart sounds and intact distal pulses.   Pulmonary/Chest: Effort normal and breath sounds normal. No respiratory distress. She has no wheezes. She has no rales. She exhibits no tenderness.  Lymphadenopathy:    She has no cervical adenopathy.  Neurological: She is alert and oriented to person, place, and time.  Skin: Skin is warm and dry. No rash noted.     Vitals reviewed.   Recent Results (from the past 2160 hour(s))  Lipid panel     Status: None   Collection Time: 11/26/16  2:45 PM  Result Value Ref Range   Cholesterol 165 0 - 200 mg/dL    Comment: ATP III Classification       Desirable:  < 200 mg/dL               Borderline High:  200 - 239 mg/dL           High:  > = 240 mg/dL   Triglycerides 67.0 0.0 - 149.0 mg/dL    Comment: Normal:  <150 mg/dLBorderline High:  150 - 199 mg/dL   HDL 56.50 >39.00 mg/dL   VLDL 13.4 0.0 - 40.0 mg/dL   LDL Cholesterol 95 0 - 99 mg/dL   Total CHOL/HDL Ratio 3     Comment:                Men          Women1/2 Average Risk     3.4          3.3Average Risk          5.0          4.42X Average Risk          9.6          7.13X Average Risk          15.0          11.0                       NonHDL 108.21     Comment: NOTE:  Non-HDL goal should be 30 mg/dL higher than patient's LDL goal (i.e. LDL goal of < 70 mg/dL, would have non-HDL goal of < 100 mg/dL)  Basic metabolic panel     Status: Abnormal   Collection Time: 11/26/16  2:45 PM  Result Value Ref Range   Sodium 139 135 - 145 mEq/L   Potassium 3.9 3.5 - 5.1 mEq/L   Chloride 101 96 - 112 mEq/L   CO2 33 (H) 19 - 32 mEq/L   Glucose, Bld 88 70 - 99 mg/dL   BUN 15 6 - 23 mg/dL   Creatinine, Ser 0.87 0.40 - 1.20 mg/dL   Calcium 9.1 8.4 - 10.5 mg/dL   GFR 70.15 >60.00 mL/min  Hepatic function panel     Status: Abnormal   Collection Time: 11/26/16  2:45 PM  Result Value Ref Range   Total Bilirubin 0.4 0.2 - 1.2 mg/dL   Bilirubin, Direct 0.1 0.0 - 0.3 mg/dL   Alkaline Phosphatase 82 39 - 117 U/L   AST 32 0 - 37 U/L   ALT 40 (H)  0 - 35 U/L   Total Protein 6.5 6.0 - 8.3 g/dL   Albumin 4.2 3.5 - 5.2 g/dL  CBC with Differential/Platelet     Status: None   Collection Time: 11/26/16  2:45 PM  Result Value Ref Range   WBC 5.5 4.0 - 10.5 K/uL   RBC 3.96 3.87 - 5.11 Mil/uL   Hemoglobin 12.4 12.0 - 15.0 g/dL   HCT 37.2 36.0 - 46.0 %   MCV 93.8 78.0 - 100.0 fl   MCHC 33.3 30.0 - 36.0 g/dL   RDW 12.2 11.5 - 15.5 %   Platelets 244.0 150.0 - 400.0 K/uL   Neutrophils Relative % 64.1 43.0 - 77.0 %   Lymphocytes Relative 24.8 12.0 - 46.0 %   Monocytes Relative 9.3 3.0 - 12.0 %   Eosinophils Relative 1.2 0.0 - 5.0 %   Basophils Relative 0.6 0.0 - 3.0 %   Neutro Abs 3.5  1.4 - 7.7 K/uL   Lymphs Abs 1.4 0.7 - 4.0 K/uL   Monocytes Absolute 0.5 0.1 - 1.0 K/uL   Eosinophils Absolute 0.1 0.0 - 0.7 K/uL   Basophils Absolute 0.0 0.0 - 0.1 K/uL    Assessment/Plan: 1. Postherpetic neuralgia Start Gabapentin TID. OTC lidocaine. Supportive measures reviewed. Follow-up scheduled.    Leeanne Rio, PA-C

## 2017-01-05 NOTE — Patient Instructions (Addendum)
Please start the Gabapentin for post-herpetic neuralgia.  Start once daily in the evening. If tolerating, can increase to twice daily for a couple of days before increasing to three times daily.  No need for further Acyclovir.  Please apply topical OTC lidocaine cream to the most sensitive areas. Symptoms will hopefully start to calm down.  Follow-up in 2 weeks. Return sooner if needed.  Postherpetic Neuralgia Postherpetic neuralgia (PHN) is nerve pain that occurs after a shingles infection. Shingles is a painful rash that appears on one side of the body, usually on your trunk or face. Shingles is caused by the varicella-zoster virus. This is the same virus that causes chickenpox. In people who have had chickenpox, the virus can resurface years later and cause shingles. You may have PHN if you continue to have pain for 3 months after your shingles rash has gone away. PHN appears in the same area where you had the shingles rash. For most people, PHN goes away within 1 year. Getting a vaccination for shingles can prevent PHN. This vaccine is recommended for people older than 50. It may prevent shingles and may also lower your risk of PHN if you do get shingles. What are the causes? PHN is caused by damage to your nerves from the varicella-zoster virus. This damage makes your nerves overly sensitive. What increases the risk? Aging is the biggest risk factor for developing PHN. Most people who get PHN are older than 76. Other risk factors include:  Having very bad pain before your shingles rash starts.  Having a very bad rash.  Having shingles in the nerve that supplies your face and eye (trigeminal nerve).  What are the signs or symptoms? Pain is the main symptom of PHN. The pain is often very bad and may be described as stabbing, burning, or feeling like an electric shock. The pain may come and go or may be there all the time. Pain may be triggered by light touches on the skin or changes in  temperature. You may have itching along with the pain. How is this diagnosed? Your health care provider may diagnose PHN based on your symptoms and your history of shingles. Lab studies and other diagnostic tests are usually not needed. How is this treated? There is no cure for PHN. Treatment for PHN will focus on pain relief. Over-the-counter pain relievers do not usually relieve PHN pain. You may need to work with a pain specialist. Treatment may include:  Antidepressant medicines to help with pain and improve sleep.  Antiseizure medicines to relieve nerve pain.  Strong pain relievers (opioids).  A numbing patch worn on the skin (lidocaine patch).  Follow these instructions at home: It may take a long time to recover from PHN. Work closely with your health care provider, and have a good support system at home.  Take all medicines as directed by your health care provider.  Wear loose, comfortable clothing.  Cover sensitive areas with a dressing to reduce friction from clothing rubbing on the area.  If cold does not make your pain worse, try applying a cool compress or cooling gel pack to the area.  Talk to your health care provider if you feel depressed or desperate. Living with long-term pain can be depressing.  Contact a health care provider if:  Your medicine is not helping.  You are struggling to manage your pain at home. This information is not intended to replace advice given to you by your health care provider. Make sure you  discuss any questions you have with your health care provider. Document Released: 06/05/2002 Document Revised: 08/21/2015 Document Reviewed: 03/06/2013 Elsevier Interactive Patient Education  [employer].

## 2017-01-06 ENCOUNTER — Ambulatory Visit: Payer: Medicare Other | Admitting: Physical Therapy

## 2017-01-11 ENCOUNTER — Encounter: Payer: Medicare Other | Admitting: Physical Therapy

## 2017-01-12 ENCOUNTER — Encounter: Payer: Self-pay | Admitting: Sports Medicine

## 2017-01-12 ENCOUNTER — Ambulatory Visit (INDEPENDENT_AMBULATORY_CARE_PROVIDER_SITE_OTHER): Payer: Medicare Other | Admitting: Sports Medicine

## 2017-01-12 ENCOUNTER — Other Ambulatory Visit: Payer: Self-pay | Admitting: Family Medicine

## 2017-01-12 VITALS — BP 136/72 | HR 48 | Ht 61.0 in | Wt 114.4 lb

## 2017-01-12 DIAGNOSIS — M25511 Pain in right shoulder: Secondary | ICD-10-CM | POA: Diagnosis not present

## 2017-01-12 DIAGNOSIS — M67911 Unspecified disorder of synovium and tendon, right shoulder: Secondary | ICD-10-CM | POA: Diagnosis not present

## 2017-01-12 NOTE — Patient Instructions (Signed)

## 2017-01-12 NOTE — Assessment & Plan Note (Signed)
Continue with formal physical therapy plan.  She had overall good improvement until she had the repetitive activity.  No focal weakness.  If any lack of improvement with adherent PT program will need MRI
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
## 2017-01-12 NOTE — Assessment & Plan Note (Signed)
Acute flareup over the past several days after repetitive use of the right upper extremity.  Pain is consistent with impingement.  Subacromial injection performed today.  PROCEDURE NOTE: RIGHT SUBACROMIALINJECTION   DESCRIPTION OF PROCEDURE:  The patient's clinical condition is marked by substantial pain and/or significant functional disability. Other conservative therapy has not provided relief, is contraindicated, or not appropriate. There is a reasonable likelihood that injection will significantly improve the patient's pain and/or functional impairment. After discussing the risks, benefits and expected outcomes of the injection and all questions were reviewed and answered, the patient wished to undergo the above named procedure. Verbal consent was obtained. The target structure was injected under direct visualization using sterile technique as below: PREP: Alcohol, Ethel Chloride APPROACH: Posterior, sterile Exchange technique, 22g 1.5" needle INJECTATE: 3cc 0.5% marcaine, 1 cc 40mg  DepoMedrol DRESSING: Band-Aid  Post procedural instructions including recommending icing and warning signs for infection were reviewed. This procedure was well tolerated and there were no complications.

## 2017-01-13 ENCOUNTER — Encounter: Payer: Medicare Other | Admitting: Physical Therapy

## 2017-01-18 ENCOUNTER — Encounter: Payer: Self-pay | Admitting: Physical Therapy

## 2017-01-18 ENCOUNTER — Ambulatory Visit: Payer: Medicare Other | Attending: Sports Medicine | Admitting: Physical Therapy

## 2017-01-18 ENCOUNTER — Encounter: Payer: Medicare Other | Admitting: Physical Therapy

## 2017-01-18 DIAGNOSIS — M25511 Pain in right shoulder: Secondary | ICD-10-CM | POA: Insufficient documentation

## 2017-01-18 DIAGNOSIS — M6281 Muscle weakness (generalized): Secondary | ICD-10-CM | POA: Diagnosis not present

## 2017-01-19 ENCOUNTER — Encounter: Payer: Self-pay | Admitting: Family Medicine

## 2017-01-19 ENCOUNTER — Ambulatory Visit (INDEPENDENT_AMBULATORY_CARE_PROVIDER_SITE_OTHER): Payer: Medicare Other | Admitting: Family Medicine

## 2017-01-19 VITALS — BP 130/70 | HR 54 | Temp 98.1°F | Resp 16 | Ht 61.0 in | Wt 113.5 lb

## 2017-01-19 DIAGNOSIS — B0229 Other postherpetic nervous system involvement: Secondary | ICD-10-CM

## 2017-01-19 DIAGNOSIS — Z23 Encounter for immunization: Secondary | ICD-10-CM | POA: Diagnosis not present

## 2017-01-19 NOTE — Therapy (Signed)
[facility] [address], [place], [address] Phone: [phone]   Fax:  [phone]  Physical Therapy Evaluation  Patient Details  Name: Theresa Martin MRN: [id number] Date of Birth: 1955-12-13 Referring Provider: Teresa Coombs, DO  Encounter Date: 01/18/2017      PT End of Session - 01/19/17 0823    Visit Number 1   Number of Visits 8   Date for PT Re-Evaluation 02/18/17   PT Start Time 1330   PT Stop Time 1430   PT Time Calculation (min) 60 min   Activity Tolerance Patient tolerated treatment well   Behavior During Therapy [facility] for tasks assessed/performed      Past Medical History:  Diagnosis Date  . Allergy   . Anxiety   . Depression   . Hyperlipidemia   . Hypertension   . Kidney stones   . Persistent headaches   . Pneumonia   . Shingles outbreak 02/2014    Past Surgical History:  Procedure Laterality Date  . CARDIOVASCULAR STRESS TEST  02/2009   treadmill stress test: Low risk  . [place]  . EXPLORATORY LAPAROTOMY     laproscopy for infertility  . EYE SURGERY Bilateral    laser-correct opening b/tn cornea and iris  . MOLE REMOVAL  2017   x 2 moles    There were no vitals filed for this visit.       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  She states she was told she has a mild R RTC tear. She states she has not had any PT for the shoulder, but has had 2 steroid injections and they did help her pain.  She reports that she has pain with overhead activities such as drying her hair and household chores that involve the UEs.  She is R hand dominant.  She denies numbness and tingling or any cervical pain.  She states that her R shoulder pain is 4-5/10 at best and 7-8/10 at worst,  and sometimes keeps her awake at night.  She takes ibuprofen to control the pain.  She states that she has to prop up her R UE at times because gravity pulling the R arm down is uncomfortable.   Pertinent History R RTC injury lifting child in car seat in June; pt states she injured the same shoulder in a similar fashion several years ago when pulling down a heavy SUV rear door   Limitations Lifting;House hold activities   Patient Stated Goals To be able to do all daily activities without having to modify them due to shoulder pain.   Currently in Pain? Yes   Pain Score 5    Pain Location Shoulder   Pain Orientation Right   Pain Descriptors / Indicators Stabbing   Pain Radiating Towards She states that her pain is like a "stabbing" pain that goes from the anterior aspect of her R shoulder through the joint and into the posterior aspect of the shoulder.   Pain Onset More than a month ago   Pain Frequency Constant   Aggravating Factors  lifting, overhead reaching   Pain Relieving Factors ibuprofen, rest   Effect of Pain on Daily Activities must modify how she uses the R UE for  ADLs involving lifting and reaching   Multiple Pain Sites No            [facility] PT Assessment - 01/18/17 1437      Assessment   Medical Diagnosis Rotator Cuff Impingment   Referring Provider Teresa Coombs, DO   Onset Date/Surgical Date 08/27/16   Hand Dominance Right   Prior Therapy --  None     Observation/Other Assessments   Quick DASH  --  29.54 at initial PT Eval     Posture/Postural Control   Posture Comments WNL     ROM / Strength   AROM / PROM / Strength AROM     AROM   Overall AROM Comments 165 deg flex in supine; 155 deg abd in supine; Full ER in supine; 65 deg IR in supine-- all pain limited     PROM   Overall PROM Comments Full PROM in all planes in supine; pain at end range of flex, abd, ER and IR     Strength   Overall Strength Comments Pt has 4+/5 mm. strength grossly throughout the R  shoulder and elbow     Palpation   Palpation comment Pt has moderate tenderness to R bicipital groove, R supraspinatus, R infraspinatus, R U/T, and RTC insertion site     Neer Impingement test    Findings --  Positive   Side Right     Hawkins-Kennedy test   Findings --  Positive   Side Right     Speed's test   Findings Positive   Side Right            Objective measurements completed on examination: See above findings.                       PT Long Term Goals - 01/19/17 0831      PT LONG TERM GOAL #1   Title Pt will be educated in and I with a home exercise program to promote UE strength, ROM, and flexibility and decrease pain.   Baseline No HEP currently   Time 1   Period Weeks   Status New   Target Date 01/25/17     PT LONG TERM GOAL #2   Title Pt will have an improved QuickDASH score by at least 50% indicating improved functional use of the R UE.   Baseline 29.54   Time 4   Period Weeks   Status New   Target Date 02/15/17     PT LONG TERM GOAL #3   Title Pt will report that her shoulder pain is 0/10 at best and 3/10 at worst on 0-10 pain scale, thereby allowing her to perform household chores and ADLs with less pain and difficulty.   Baseline 4-5/10 at best; 7-8/10 at worst on 0-10 pain scale at intial PT evaluation   Time 4   Period Weeks   Status New   Target Date 02/15/17     PT LONG TERM GOAL #4   Title Pt will have full active and passive ROM in all planes at R shoulder, and 5/5 muscle strength throughout R UE, allowing her to perform ADLs such as drying her hair, with less pain and difficulty.   Baseline Decreased AROM in flexion, IR, and abd (see objective section)   Time 4   Period Weeks   Status New   Target Date 02/15/17                Plan - 01/19/17  0932    Clinical Impression Statement Ms. Cryder has S/S consistent with RTC impingement on the R shoulder.  She has limited end range of motion in flexion, abd,  and IR due to pain and inflammation.  She also has discomfort in the R bicipital groove.  She has decreased strength in the R shoulder and elbow as well.  She should respond well to skilled PT intervention to promote overall UE strength, flexibility and ROM.  Her QuickDASH score is currenlty 29.54 indicating a significant level of functional disability due to her shoulder pain.     Clinical Presentation Stable   Clinical Decision Making High   Rehab Potential Good   Clinical Impairments Affecting Rehab Potential pt reports she has a small tear in the RTC   PT Frequency 2x / week   PT Duration 4 weeks   PT Treatment/Interventions Cryotherapy;Electrical Stimulation;Moist Heat;Therapeutic activities;Therapeutic exercise;Patient/family education;Manual techniques;Passive range of motion   PT Next Visit Plan Review HEP and advance as tolerated; Manual therapy to decrease pain and improve ROM   PT Home Exercise Plan YTB ER, IR, rows, bicep curls; icing regimen   Consulted and Agree with Plan of Care Patient      Patient will benefit from skilled therapeutic intervention in order to improve the following deficits and impairments:  Decreased range of motion, Decreased strength, Impaired flexibility, Impaired UE functional use, Pain  Visit Diagnosis: Acute pain of right shoulder - Plan: PT plan of care cert/re-cert  Muscle weakness (generalized) - Plan: PT plan of care cert/re-cert      G-Codes - 67/12/45 8099    Functional Assessment Tool Used (Outpatient Only) QuickDASH   Functional Limitation Carrying, moving and handling objects   Carrying, Moving and Handling Objects Current Status (I3382) At least 60 percent but less than 80 percent impaired, limited or restricted   Carrying, Moving and Handling Objects Goal Status (N0539) At least 1 percent but less than 20 percent impaired, limited or restricted   Carrying, Moving and Handling Objects Discharge Status [id number]) At least 1 percent but less  than 20 percent impaired, limited or restricted       Problem List Patient Active Problem List   Diagnosis Date Noted  . Right shoulder pain 01/12/2017  . Tendinopathy of rotator cuff 12/20/2016  . Angle-closure glaucoma, severe stage 08/19/2015  . Migraine without aura and without status migrainosus, not intractable 01/13/2015  . High risk medications (not anticoagulants) long-term use 09/26/2014  . Nipple symptom or sign in female 05/10/2014  . Breast signs and symptoms 02/08/2014  . Acute gastroenteritis 05/07/2013  . Cerumen impaction 05/07/2013  . Routine general medical examination at a health care facility 07/27/2012  . Hyperlipidemia 09/29/2010  . URETEROPELVIC JUNCTION OBSTRUCTION, CONGENITAL 12/15/2009  . CONSTIPATION 12/11/2009  . ABDOMINAL BLOATING 12/11/2009  . KNEE PAIN, BILATERAL 12/19/2008  . CARDIAC MURMUR 11/26/2008  . MAGNETIC RESONANCE IMAGING, BRAIN, ABNORMAL 10/29/2008  . VENEREAL WART 10/23/2008  . Major depressive disorder, recurrent episode ([place]) 10/23/2008  . COMMON MIGRAINE 10/23/2008  . Essential hypertension 10/23/2008  . ALLERGIC RHINITIS 10/23/2008  . ASTHMA, INTERMITTENT, MILD 10/23/2008  . RENAL CALCULUS, HX OF 10/23/2008    Marcellas Marchant, MPT 01/19/2017, 10:16 AM  [facility] [address] [place], [address] Phone: [phone]   Fax:  [phone]  Name: MARYSSA GIAMPIETRO MRN: [id number] Date of Birth: 07-03-1955

## 2017-01-19 NOTE — Patient Instructions (Addendum)
Follow up as needed or as scheduled Keep up the good work!  You look great! Call with any questions or concerns Happy Fall!!!

## 2017-01-19 NOTE — Progress Notes (Signed)
   Subjective:    Patient ID: MADGIE DHALIWAL, female    DOB: 06/02/1955, 61 y.o.   MRN: [id number]  HPI Postherpetic Neuralgia- pt reports sxs have improved.  She was able to stop the gabapentin last week.  Shingles have cleared.  Able to wear bra and clothes w/o difficulty.     Review of Systems For ROS see HPI     Objective:   Physical Exam  Constitutional: She is oriented to person, place, and time. She appears well-developed and well-nourished. No distress.  HENT:  Head: Normocephalic and atraumatic.  Neurological: She is alert and oriented to person, place, and time.  Skin: Skin is warm and dry.  Psychiatric: She has a normal mood and affect. Her behavior is normal. Thought content normal.  Vitals reviewed.         Assessment & Plan:  Postherpetic neuralgia- improved.  Pt is no longer on gabapentin and no need to restart at this time.  Discussed shingles and possibility of recurrence.  Pt expressed understanding and is in agreement w/ plan.

## 2017-01-19 NOTE — Patient Instructions (Signed)
Pt instructed in and performed new Home Exercise Program as follows: R shoulder ER, IR, Rows, and bicep curls all 2x10 with yellow theraband.  Pt was given hand outs to take home with written instructions and pictures of the exercises.  Pt was educated in how to avoid impingement with certain movements and in an icing regimen to decrease pain and inflammation.

## 2017-01-20 ENCOUNTER — Encounter: Payer: Medicare Other | Admitting: Physical Therapy

## 2017-01-20 ENCOUNTER — Ambulatory Visit: Payer: Medicare Other | Admitting: Physical Therapy

## 2017-01-21 ENCOUNTER — Ambulatory Visit: Payer: Medicare Other

## 2017-01-21 DIAGNOSIS — M25511 Pain in right shoulder: Secondary | ICD-10-CM

## 2017-01-21 DIAGNOSIS — M6281 Muscle weakness (generalized): Secondary | ICD-10-CM

## 2017-01-21 NOTE — Therapy (Addendum)
[facility] [address] [place], [address] Phone: [phone]   Fax:  [phone]  Physical Therapy Treatment  Patient Details  Name: Theresa Martin MRN: [id number] Date of Birth: 1955/04/30 Referring Provider: Teresa Coombs, DO  Encounter Date: 01/21/2017      PT End of Session - 01/21/17 0959    Visit Number 2   Number of Visits 8   Date for PT Re-Evaluation 02/18/17   PT Start Time 1001   PT Stop Time 1050   PT Time Calculation (min) 49 min   Activity Tolerance Patient tolerated treatment well   Behavior During Therapy [facility] for tasks assessed/performed      Past Medical History:  Diagnosis Date  . Allergy   . Anxiety   . Depression   . Hyperlipidemia   . Hypertension   . Kidney stones   . Persistent headaches   . Pneumonia   . Shingles outbreak 02/2014    Past Surgical History:  Procedure Laterality Date  . CARDIOVASCULAR STRESS TEST  02/2009   treadmill stress test: Low risk  . [place]  . EXPLORATORY LAPAROTOMY     laproscopy for infertility  . EYE SURGERY Bilateral    laser-correct opening b/tn cornea and iris  . MOLE REMOVAL  2017   x 2 moles    There were no vitals filed for this visit.      Subjective Assessment - 01/21/17 0957    Subjective Pt reports she is doing well on this date. She complains of 4/10 resting R shoulder pain. States that her HEP aggravates her shoulder pain. Otherwise no changes in health and no specific questions or concerns.    Pertinent History R RTC injury lifting child in car seat in June; pt states she injured the same shoulder in a similar fashion several years ago when pulling down a heavy SUV rear door   Limitations Lifting;House hold activities   Patient Stated Goals To be able to do all daily activities without having to modify them due to shoulder pain.   Currently in Pain? Yes   Pain Score 4    Pain Location  Shoulder   Pain Orientation Right   Pain Descriptors / Indicators Pressure   Pain Type Chronic pain   Pain Onset More than a month ago   Pain Frequency Constant                TREATMENT  Manual Therapy  PROM R shoulder with end range holds for flexion, abduction, ER, and IR; R shoulder AP mobilizations at neutral 30s/bout x 2 bouts; R pec minor stretch 30s x 3 bouts;  Ther-ex  Serratus punch with manual resistance 3 x 10; Rhythmic stabs at 90 flexion with resistance at elbow 30s x 3; Supine R shoulder flexion 3# dumbbell 2 x 10, "pull/pressure" sensation in R shoulder; L sidelying R shoulder abduction 2# dumbbell 2 x 10, mild increase in posterior shoulder pain; L sidelying R shoulder ER with 2# dumbbell, wash cloth between body and elbow 2 x 10; R shoulder isometrics for flexion, extension, abduction, IR, and ER 5s hold x 10 each (issued for HEP); R shoulder doorway pec stretch 30s hold;                       PT Education - 01/21/17 0957    Education provided Yes   Education Details exercise form/technique, HEP  Person(s) Educated Patient   Methods Explanation   Comprehension Verbalized understanding             PT Long Term Goals - 01/19/17 0831      PT LONG TERM GOAL #1   Title Pt will be educated in and I with a home exercise program to promote UE strength, ROM, and flexibility and decrease pain.   Baseline No HEP currently   Time 1   Period Weeks   Status New   Target Date 01/25/17     PT LONG TERM GOAL #2   Title Pt will have an improved QuickDASH score by at least 50% indicating improved functional use of the R UE.   Baseline 29.54   Time 4   Period Weeks   Status New   Target Date 02/15/17     PT LONG TERM GOAL #3   Title Pt will report that her shoulder pain is 0/10 at best and 3/10 at worst on 0-10 pain scale, thereby allowing her to perform household chores and ADLs with less pain and difficulty.   Baseline 4-5/10 at  best; 7-8/10 at worst on 0-10 pain scale at intial PT evaluation   Time 4   Period Weeks   Status New   Target Date 02/15/17     PT LONG TERM GOAL #4   Title Pt will have full active and passive ROM in all planes at R shoulder, and 5/5 muscle strength throughout R UE, allowing her to perform ADLs such as drying her hair, with less pain and difficulty.   Baseline Decreased AROM in flexion, IR, and abd (see objective section)   Time 4   Period Weeks   Status New   Target Date 02/15/17               Plan - 01/21/17 0959    Clinical Impression Statement Pt reports pain in R shoulder with most all AROM of R shoulder. R shoulder PROM appears full and painless in all planes. She is particularly tender with palpation over R coracoacromial ligament and R pec minor.  HEP modified today due to patient reporting increase in R shoulder pain with exercises initiated at evaluation. Pt provided HEP today which includes isometrics and R pec stretch. Pt will benefit from continued PT services to address deficits in R shoulder pain in order to return to full function at home and with leisure activities.   Rehab Potential Good   Clinical Impairments Affecting Rehab Potential pt reports she has a small tear in the RTC   PT Frequency 2x / week   PT Duration 4 weeks   PT Treatment/Interventions Cryotherapy;Electrical Stimulation;Moist Heat;Therapeutic activities;Therapeutic exercise;Patient/family education;Manual techniques;Passive range of motion   PT Next Visit Plan Review HEP and advance as tolerated; Manual therapy to decrease pain and improve ROM   PT Home Exercise Plan R shoulder isometrics for flexion, extension, abduction, IR/ER, and R shoulder pec stretch in doorway   Consulted and Agree with Plan of Care Patient      Patient will benefit from skilled therapeutic intervention in order to improve the following deficits and impairments:  Decreased range of motion, Decreased strength, Impaired  flexibility, Impaired UE functional use, Pain  Visit Diagnosis: Acute pain of right shoulder  Muscle weakness (generalized)     Problem List Patient Active Problem List   Diagnosis Date Noted  . Right shoulder pain 01/12/2017  . Tendinopathy of rotator cuff 12/20/2016  . Angle-closure glaucoma, severe stage 08/19/2015  .  Migraine without aura and without status migrainosus, not intractable 01/13/2015  . High risk medications (not anticoagulants) long-term use 09/26/2014  . Nipple symptom or sign in female 05/10/2014  . Breast signs and symptoms 02/08/2014  . Acute gastroenteritis 05/07/2013  . Cerumen impaction 05/07/2013  . Routine general medical examination at a health care facility 07/27/2012  . Hyperlipidemia 09/29/2010  . URETEROPELVIC JUNCTION OBSTRUCTION, CONGENITAL 12/15/2009  . CONSTIPATION 12/11/2009  . ABDOMINAL BLOATING 12/11/2009  . KNEE PAIN, BILATERAL 12/19/2008  . CARDIAC MURMUR 11/26/2008  . MAGNETIC RESONANCE IMAGING, BRAIN, ABNORMAL 10/29/2008  . VENEREAL WART 10/23/2008  . Major depressive disorder, recurrent episode ([place]) 10/23/2008  . COMMON MIGRAINE 10/23/2008  . Essential hypertension 10/23/2008  . ALLERGIC RHINITIS 10/23/2008  . ASTHMA, INTERMITTENT, MILD 10/23/2008  . RENAL CALCULUS, HX OF 10/23/2008   Phillips Grout PT, DPT   Huprich,Jason 01/21/2017, 11:58 AM  [facility] [address] [place], [address] Phone: [phone]   Fax:  [phone]  Name: Theresa Martin MRN: [id number] Date of Birth: 1955-10-15

## 2017-01-24 ENCOUNTER — Ambulatory Visit (INDEPENDENT_AMBULATORY_CARE_PROVIDER_SITE_OTHER): Payer: Medicare Other | Admitting: Family Medicine

## 2017-01-24 ENCOUNTER — Telehealth: Payer: Self-pay | Admitting: Family Medicine

## 2017-01-24 ENCOUNTER — Encounter: Payer: Self-pay | Admitting: Family Medicine

## 2017-01-24 VITALS — BP 124/68 | HR 50 | Temp 98.6°F | Wt 113.2 lb

## 2017-01-24 DIAGNOSIS — J029 Acute pharyngitis, unspecified: Secondary | ICD-10-CM | POA: Diagnosis not present

## 2017-01-24 LAB — POCT RAPID STREP A (OFFICE): Rapid Strep A Screen: POSITIVE — AB

## 2017-01-24 MED ORDER — AMOXICILLIN 875 MG PO TABS: 875.0000 mg | ORAL_TABLET | Freq: Two times a day (BID) | ORAL | 0 refills | Status: DC

## 2017-01-24 NOTE — Progress Notes (Signed)
   Subjective:    Patient ID: Theresa Martin, female    DOB: June 24, 1955, 61 y.o.   MRN: [id number]  HPI URI- sxs started 5 days ago.  No fever.  + body aches.  No sinus pain/pressure.  + sore throat on Thursday, resolved on Friday, but worsened over the weekend.  Mild cough- intermittently productive.  Denies SOB.  No known sick contacts but has been around young grandchildren.   Review of Systems For ROS see HPI     Objective:   Physical Exam  Constitutional: She is oriented to person, place, and time. She appears well-developed and well-nourished. No distress.  HENT:  Head: Normocephalic and atraumatic.  Nose: Nose normal.  TMs normal bilaterally No TTP over sinuses Posterior pharyngeal erythema but no tonsillar enlargement or exudate  Neck: Normal range of motion. Neck supple.  Cardiovascular: Normal rate, regular rhythm and normal heart sounds.   Pulmonary/Chest: Effort normal and breath sounds normal. No respiratory distress. She has no wheezes. She has no rales.  Lymphadenopathy:    She has cervical adenopathy.  Neurological: She is alert and oriented to person, place, and time.  Skin: Skin is warm.  Psychiatric: She has a normal mood and affect. Her behavior is normal. Thought content normal.  Vitals reviewed.         Assessment & Plan:  Strep throat- pt's strep test was +.  Start Amox.  Reviewed dx, tx, and supportive measures.  Pt expressed understanding and is in agreement w/ plan.

## 2017-01-24 NOTE — Telephone Encounter (Signed)
Patient Name: Amir Prows DOB: 21-Aug-1955 Initial Comment Caller states c/o sore throat, chest congestion and slight cough. Nurse Assessment Nurse: Sherrell Puller, RN, Amy Date/Time Eilene Ghazi Time): 01/24/2017 8:34:53 AM Confirm and document reason for call. If symptomatic, describe symptoms. ---Caller states she has a sore throat, chest congestion, achy in her back, slight cough for 5 days. No fever. Eating and drinking okay. Received a Flu shot last Wednesday. Does the patient have any new or worsening symptoms? ---Yes Will a triage be completed? ---Yes Related visit to physician within the last 2 weeks? ---No Does the PT have any chronic conditions? (i.e. diabetes, asthma, etc.) ---No Is this a behavioral health or substance abuse call? ---No Guidelines Guideline Title Affirmed Question Affirmed Notes Sore Throat [1] Sore throat with cough/cold symptoms AND [2] present > 5 days Final Disposition User See PCP When Office is Open (within 3 days) Sherrell Puller, RN, Amy Comments Appt scheduled for today at 3:45 with PCP. Referrals REFERRED TO PCP OFFICE Caller Disagree/Comply Comply Caller Understands Yes PreDisposition Home Care

## 2017-01-24 NOTE — Patient Instructions (Signed)
Follow up as needed or as scheduled Your strep test is + Start the Amoxicillin twice daily- take w/ food Drink plenty of fluids Ibuprofen as needed for pain/fever Call with any questions or concerns Hang in there!!!

## 2017-01-24 NOTE — Telephone Encounter (Signed)
[facility] [place] - Patient was added to your schedule today by [facility]

## 2017-01-25 ENCOUNTER — Ambulatory Visit: Payer: Medicare Other | Admitting: Physical Therapy

## 2017-01-26 ENCOUNTER — Encounter: Payer: Medicare Other | Admitting: Physical Therapy

## 2017-01-26 ENCOUNTER — Ambulatory Visit
Admission: RE | Admit: 2017-01-26 | Discharge: 2017-01-26 | Disposition: A | Discharge status: Home / Self Care | Payer: Medicare Other | Source: Ambulatory Visit | Attending: Family Medicine | Admitting: Family Medicine

## 2017-01-26 DIAGNOSIS — Z1231 Encounter for screening mammogram for malignant neoplasm of breast: Secondary | ICD-10-CM | POA: Insufficient documentation

## 2017-01-30 ENCOUNTER — Other Ambulatory Visit: Payer: Self-pay | Admitting: Family Medicine

## 2017-01-31 ENCOUNTER — Ambulatory Visit: Payer: Medicare Other | Admitting: Physical Therapy

## 2017-01-31 DIAGNOSIS — F411 Generalized anxiety disorder: Secondary | ICD-10-CM | POA: Diagnosis not present

## 2017-01-31 DIAGNOSIS — F3181 Bipolar II disorder: Secondary | ICD-10-CM | POA: Diagnosis not present

## 2017-02-03 ENCOUNTER — Ambulatory Visit: Payer: Medicare Other | Attending: Sports Medicine | Admitting: Physical Therapy

## 2017-02-03 DIAGNOSIS — M25511 Pain in right shoulder: Secondary | ICD-10-CM | POA: Diagnosis not present

## 2017-02-03 DIAGNOSIS — M6281 Muscle weakness (generalized): Secondary | ICD-10-CM | POA: Diagnosis not present

## 2017-02-03 NOTE — Therapy (Signed)
[facility] [address], [place], [address] Phone: [phone]   Fax:  [phone]  Physical Therapy Treatment  Patient Details  Name: Theresa Martin MRN: [id number] Date of Birth: [birth year] Referring Provider: Teresa Coombs, DO   Encounter Date: 02/03/2017  PT End of Session - 02/03/17 1701    Visit Number  3    Number of Visits  8    Date for PT Re-Evaluation  02/18/17    PT Start Time  4259    PT Stop Time  1600    PT Time Calculation (min)  43 min    Activity Tolerance  Patient tolerated treatment well    Behavior During Therapy  [facility] for tasks assessed/performed       Past Medical History:  Diagnosis Date  . Allergy   . Anxiety   . Depression   . Hyperlipidemia   . Hypertension   . Kidney stones   . Persistent headaches   . Pneumonia   . Shingles outbreak 02/2014    Past Surgical History:  Procedure Laterality Date  . CARDIOVASCULAR STRESS TEST  02/2009   treadmill stress test: Low risk  . [place]  . EXPLORATORY LAPAROTOMY     laproscopy for infertility  . EYE SURGERY Bilateral    laser-correct opening b/tn cornea and iris  . MOLE REMOVAL  2017   x 2 moles    There were no vitals filed for this visit.  Subjective Assessment - 02/03/17 1521    Subjective  Patient reports her shoulder pain has improved, but still persists. She has periods with no pain, and periods where she is not doing anything and finds that it hurts (both in pectoral area and posterior cuff muscle bellies). She has had 1 episode of numbness in the hand, no headaches or neck pain are associated with this pain. She denies any unexpected weight loss. She was lifting her grandchild up into a car (up and into the SUV) and has a history of a previous shoulder injury. She denies any clicking/popping or grinding.     Pertinent History  R RTC injury lifting child in car seat in June; pt states she  injured the same shoulder in a similar fashion several years ago when pulling down a heavy SUV rear door    Limitations  Lifting;House hold activities    Patient Stated Goals  To be able to do all daily activities without having to modify them due to shoulder pain.    Currently in Pain?  No/denies    Pain Onset  --       Spurling's test - negative   Cervical AROM - WNL and no pain in all directions   AROM into abduction and flexion -- no pain reported   MMT  Reproduced anterior shoulder pain with IR and ER on R but no strength loss   Negative biceps load 2 test   Positive Hawkins-Kennedy   ER/IR PROM - WNL pain at full ER   Performed Grade I-II mobilizations over thoracic and cervical spinal segments at roughly 30-45" per bout for 2-3 bouts at each level, patient reported these were initially painful, however she reports significant decrease in discomfort with repeated bouts.   Performed supine manual traction grade I-II for 5 bouts x 30-60" per bout with positive response of improved pain symptoms and decreased sense of tightness throughout upper thoracic and cervical musculature.  PT Education - 02/03/17 1608    Education provided  Yes    Education Details  Manual therapy appears to be indicated given her response this date.     Person(s) Educated  Patient    Methods  Explanation;Demonstration    Comprehension  Verbalized understanding;Returned demonstration          PT Long Term Goals - 01/19/17 0831      PT LONG TERM GOAL #1   Title  Pt will be educated in and I with a home exercise program to promote UE strength, ROM, and flexibility and decrease pain.    Baseline  No HEP currently    Time  1    Period  Weeks    Status  New    Target Date  01/25/17      PT LONG TERM GOAL #2   Title  Pt will have an improved QuickDASH score by at least 50% indicating improved functional use of the R UE.    Baseline  29.54    Time  4     Period  Weeks    Status  New    Target Date  02/15/17      PT LONG TERM GOAL #3   Title  Pt will report that her shoulder pain is 0/10 at best and 3/10 at worst on 0-10 pain scale, thereby allowing her to perform household chores and ADLs with less pain and difficulty.    Baseline  4-5/10 at best; 7-8/10 at worst on 0-10 pain scale at intial PT evaluation    Time  4    Period  Weeks    Status  New    Target Date  02/15/17      PT LONG TERM GOAL #4   Title  Pt will have full active and passive ROM in all planes at R shoulder, and 5/5 muscle strength throughout R UE, allowing her to perform ADLs such as drying her hair, with less pain and difficulty.    Baseline  Decreased AROM in flexion, IR, and abd (see objective section)    Time  4    Period  Weeks    Status  New    Target Date  02/15/17            Plan - 02/03/17 1701    Clinical Impression Statement  Patient has had poor response to more active treatment thus far. She responded quite well to manual treatments in this session, where she was initially painful with IR and ER, to conclude session she had minimal pain with IR/ER. Given her response, will continue to progress with manual techniques until her discomfort is more appropriate for active approach.     Clinical Presentation  Stable    Clinical Decision Making  Moderate    Rehab Potential  Good    Clinical Impairments Affecting Rehab Potential  pt reports she has a small tear in the RTC    PT Frequency  2x / week    PT Duration  4 weeks    PT Treatment/Interventions  Cryotherapy;Electrical Stimulation;Moist Heat;Therapeutic activities;Therapeutic exercise;Patient/family education;Manual techniques;Passive range of motion    PT Next Visit Plan  Review HEP and advance as tolerated; Manual therapy to decrease pain and improve ROM    PT Home Exercise Plan  R shoulder isometrics for flexion, extension, abduction, IR/ER, and R shoulder pec stretch in doorway    Consulted  and Agree with Plan of Care  Patient  Patient will benefit from skilled therapeutic intervention in order to improve the following deficits and impairments:  Decreased range of motion, Decreased strength, Impaired flexibility, Impaired UE functional use, Pain  Visit Diagnosis: Acute pain of right shoulder  Muscle weakness (generalized)     Problem List Patient Active Problem List   Diagnosis Date Noted  . Right shoulder pain 01/12/2017  . Tendinopathy of rotator cuff 12/20/2016  . Angle-closure glaucoma, severe stage 08/19/2015  . Migraine without aura and without status migrainosus, not intractable 01/13/2015  . High risk medications (not anticoagulants) long-term use 09/26/2014  . Nipple symptom or sign in female 05/10/2014  . Breast signs and symptoms 02/08/2014  . Acute gastroenteritis 05/07/2013  . Cerumen impaction 05/07/2013  . Routine general medical examination at a health care facility 07/27/2012  . Hyperlipidemia 09/29/2010  . URETEROPELVIC JUNCTION OBSTRUCTION, CONGENITAL 12/15/2009  . CONSTIPATION 12/11/2009  . ABDOMINAL BLOATING 12/11/2009  . KNEE PAIN, BILATERAL 12/19/2008  . CARDIAC MURMUR 11/26/2008  . MAGNETIC RESONANCE IMAGING, BRAIN, ABNORMAL 10/29/2008  . VENEREAL WART 10/23/2008  . Major depressive disorder, recurrent episode ([place]) 10/23/2008  . COMMON MIGRAINE 10/23/2008  . Essential hypertension 10/23/2008  . ALLERGIC RHINITIS 10/23/2008  . ASTHMA, INTERMITTENT, MILD 10/23/2008  . RENAL CALCULUS, HX OF 10/23/2008   Royce Macadamia PT, DPT, CSCS    02/03/2017, 5:05 PM  [facility] [address] [place], [address] Phone: [phone]   Fax:  [phone]  Name: Theresa Martin MRN: [id number] Date of Birth: 1955-12-27

## 2017-02-03 NOTE — Patient Instructions (Signed)
Spurling's test - negative   Cervical AROM - WNL and no pain in all directions   AROM into abduction and flexion -- no pain reported   MMT  Reproduced anterior shoulder pain with IR and ER on R but no strength loss   Negative biceps load 2 test   Positive Hawkins-Kennedy   ER/IR PROM - WNL pain at full ER

## 2017-02-08 ENCOUNTER — Ambulatory Visit: Payer: Medicare Other | Admitting: Physical Therapy

## 2017-02-08 DIAGNOSIS — M25511 Pain in right shoulder: Secondary | ICD-10-CM | POA: Diagnosis not present

## 2017-02-08 DIAGNOSIS — M6281 Muscle weakness (generalized): Secondary | ICD-10-CM | POA: Diagnosis not present

## 2017-02-08 NOTE — Therapy (Signed)
[facility] [address] [place], [address] Phone: [phone]   Fax:  [phone]  Physical Therapy Treatment  Patient Details  Name: Theresa Martin MRN: [id number] Date of Birth: 04/02/1955 Referring Provider: Teresa Coombs, DO   Encounter Date: 02/08/2017  PT End of Session - 02/08/17 1406    Visit Number  4  (Pended)     Number of Visits  8  (Pended)     Date for PT Re-Evaluation  02/18/17  (Pended)     PT Start Time  1125  (Pended)     PT Stop Time  1210  (Pended)     PT Time Calculation (min)  45 min  (Pended)     Activity Tolerance  Patient tolerated treatment well  (Pended)     Behavior During Therapy  [facility] for tasks assessed/performed  (Pended)        Past Medical History:  Diagnosis Date  . Allergy   . Anxiety   . Depression   . Hyperlipidemia   . Hypertension   . Kidney stones   . Persistent headaches   . Pneumonia   . Shingles outbreak 02/2014    Past Surgical History:  Procedure Laterality Date  . CARDIOVASCULAR STRESS TEST  02/2009   treadmill stress test: Low risk  . [place]  . EXPLORATORY LAPAROTOMY     laproscopy for infertility  . EYE SURGERY Bilateral    laser-correct opening b/tn cornea and iris  . MOLE REMOVAL  2017   x 2 moles    There were no vitals filed for this visit.  Subjective Assessment - 02/08/17 1404    Subjective  Patient reports manual therapy was very helpful, she had several days of relief before having onset of symptoms return. She notes that she is having pain/stiffness moreso on the R side of upper thoracic/cervical spine area this date.    Pertinent History  R RTC injury lifting child in car seat in June; pt states she injured the same shoulder in a similar fashion several years ago when pulling down a heavy SUV rear door    Limitations  Lifting;House hold activities    Patient Stated Goals  To be able to do all daily  activities without having to modify them due to shoulder pain.    Currently in Pain?  Yes    Pain Score  -- Mild discomfort in R rhomboids and MT area and stiffness in thoracic/cervical spine.         Joint mobilizations in thoracic and cervical spine, UPAs on R side as well, performed joint mobilizations around T5 on R side just medial to scapulae 3-4 bouts per spot grade I-II mobilizations with relief reported after mobilizations, no areas of focal pain, she reported these generally felt beneficial.   Isometric ER in supine with arm at 90 degrees of abduction and neutral starting point. Patient began to report medial elbow pain at roughly 10 repetitions (noted to be around medial epicondyle)    A-P and medial to lateral joint mobilizations of ulno-humeral joint grade I-II x 3 bouts in each direction for 30-45" per bout, no significant relief noted   Soft tissue mobilization performed over UCL and medial compartment musculature with mild to moderate relief noted by patient to conclude session                 PT Education - 02/08/17 1406    Education provided  Yes    Education Details  Indications and progression from manual therapy to more active based rehab exercises.     Person(s) Educated  Patient    Methods  Explanation;Demonstration    Comprehension  Verbalized understanding;Returned demonstration          PT Long Term Goals - 01/19/17 0831      PT LONG TERM GOAL #1   Title  Pt will be educated in and I with a home exercise program to promote UE strength, ROM, and flexibility and decrease pain.    Baseline  No HEP currently    Time  1    Period  Weeks    Status  New    Target Date  01/25/17      PT LONG TERM GOAL #2   Title  Pt will have an improved QuickDASH score by at least 50% indicating improved functional use of the R UE.    Baseline  29.54    Time  4    Period  Weeks    Status  New    Target Date  02/15/17      PT LONG TERM GOAL #3   Title   Pt will report that her shoulder pain is 0/10 at best and 3/10 at worst on 0-10 pain scale, thereby allowing her to perform household chores and ADLs with less pain and difficulty.    Baseline  4-5/10 at best; 7-8/10 at worst on 0-10 pain scale at intial PT evaluation    Time  4    Period  Weeks    Status  New    Target Date  02/15/17      PT LONG TERM GOAL #4   Title  Pt will have full active and passive ROM in all planes at R shoulder, and 5/5 muscle strength throughout R UE, allowing her to perform ADLs such as drying her hair, with less pain and difficulty.    Baseline  Decreased AROM in flexion, IR, and abd (see objective section)    Time  4    Period  Weeks    Status  New    Target Date  02/15/17            Plan - 02/08/17 1407    Clinical Impression Statement  Patient continues to report significant relief with manual based treatments thus far, she performed isometric ERs  and was noted to be extending her wrist, she had some complaints of elbow discomfort (medially) to conclude this session, which improved with soft tissue mobilization.     Clinical Presentation  Stable    Clinical Decision Making  Moderate    Rehab Potential  Good    Clinical Impairments Affecting Rehab Potential  pt reports she has a small tear in the RTC    PT Frequency  2x / week    PT Duration  4 weeks    PT Treatment/Interventions  Cryotherapy;Electrical Stimulation;Moist Heat;Therapeutic activities;Therapeutic exercise;Patient/family education;Manual techniques;Passive range of motion    PT Next Visit Plan  Review HEP and advance as tolerated; Manual therapy to decrease pain and improve ROM    PT Home Exercise Plan  R shoulder isometrics for flexion, extension, abduction, IR/ER, and R shoulder pec stretch in doorway    Consulted and Agree with Plan of Care  Patient       Patient will benefit from skilled therapeutic intervention in order to improve the following deficits and impairments:  Decreased  range of motion, Decreased strength, Impaired  flexibility, Impaired UE functional use, Pain  Visit Diagnosis: Acute pain of right shoulder  Muscle weakness (generalized)     Problem List Patient Active Problem List   Diagnosis Date Noted  . Right shoulder pain 01/12/2017  . Tendinopathy of rotator cuff 12/20/2016  . Angle-closure glaucoma, severe stage 08/19/2015  . Migraine without aura and without status migrainosus, not intractable 01/13/2015  . High risk medications (not anticoagulants) long-term use 09/26/2014  . Nipple symptom or sign in female 05/10/2014  . Breast signs and symptoms 02/08/2014  . Acute gastroenteritis 05/07/2013  . Cerumen impaction 05/07/2013  . Routine general medical examination at a health care facility 07/27/2012  . Hyperlipidemia 09/29/2010  . URETEROPELVIC JUNCTION OBSTRUCTION, CONGENITAL 12/15/2009  . CONSTIPATION 12/11/2009  . ABDOMINAL BLOATING 12/11/2009  . KNEE PAIN, BILATERAL 12/19/2008  . CARDIAC MURMUR 11/26/2008  . MAGNETIC RESONANCE IMAGING, BRAIN, ABNORMAL 10/29/2008  . VENEREAL WART 10/23/2008  . Major depressive disorder, recurrent episode ([place]) 10/23/2008  . COMMON MIGRAINE 10/23/2008  . Essential hypertension 10/23/2008  . ALLERGIC RHINITIS 10/23/2008  . ASTHMA, INTERMITTENT, MILD 10/23/2008  . RENAL CALCULUS, HX OF 10/23/2008   Royce Macadamia PT, DPT, CSCS    02/08/2017, 2:09 PM  [facility] [address] [place], [address] Phone: [phone]   Fax:  [phone]  Name: Theresa Martin MRN: [id number] Date of Birth: 27-Oct-1955

## 2017-02-10 ENCOUNTER — Ambulatory Visit: Payer: Medicare Other | Admitting: Physical Therapy

## 2017-02-10 DIAGNOSIS — M25511 Pain in right shoulder: Secondary | ICD-10-CM | POA: Diagnosis not present

## 2017-02-10 DIAGNOSIS — M6281 Muscle weakness (generalized): Secondary | ICD-10-CM

## 2017-02-10 NOTE — Therapy (Signed)
[facility] [address] [place], [address] Phone: [phone]   Fax:  [phone]  Physical Therapy Treatment  Patient Details  Name: Theresa Martin MRN: [id number] Date of Birth: 03-19-1956 Referring Provider: Teresa Coombs, DO   Encounter Date: 02/10/2017  PT End of Session - 02/10/17 1521    Visit Number  5    Number of Visits  8    Date for PT Re-Evaluation  02/18/17    PT Start Time  9371    PT Stop Time  1518    PT Time Calculation (min)  40 min    Activity Tolerance  Patient tolerated treatment well    Behavior During Therapy  [facility] for tasks assessed/performed       Past Medical History:  Diagnosis Date  . Allergy   . Anxiety   . Depression   . Hyperlipidemia   . Hypertension   . Kidney stones   . Persistent headaches   . Pneumonia   . Shingles outbreak 02/2014    Past Surgical History:  Procedure Laterality Date  . CARDIOVASCULAR STRESS TEST  02/2009   treadmill stress test: Low risk  . [place]  . EXPLORATORY LAPAROTOMY     laproscopy for infertility  . EYE SURGERY Bilateral    laser-correct opening b/tn cornea and iris  . MOLE REMOVAL  2017   x 2 moles    There were no vitals filed for this visit.  Subjective Assessment - 02/10/17 1439    Subjective  Patient reports she has made good progress with pain control, reports at least 60% improvement. She reports especially going out in the cold seems to aggravate it.     Pertinent History  R RTC injury lifting child in car seat in June; pt states she injured the same shoulder in a similar fashion several years ago when pulling down a heavy SUV rear door    Limitations  Lifting;House hold activities    Patient Stated Goals  To be able to do all daily activities without having to modify them due to shoulder pain.    Currently in Pain?  Yes    Pain Score  -- Mild discomfort on R side of upper thoracic spine      Pain Location  Shoulder    Pain Orientation  Right    Pain Descriptors / Indicators  Aching    Pain Type  Chronic pain        TDN to T4, and R upper trap/levator scapulae -- no LTR noted or reported by patient, however noted soft tissue change in resistance by therapist with pistoning to a depth of 3-56mm, no increase in symptoms reported by patient. Patient informed of risks and benefits as well as contra-indications and need to seek emergency treatment if she feels short of breath. (unbilled)  CPAs and UPAs on R T6-T4 (grade I-II, performed x 5 bouts at each level x 45" per bout) -- patient reported reproduction of symptoms at T4-6 on R sided UPAs, decreased pain/discomfort after completion   MT resisted -- reproduced her symptoms, not LT or hand at side in prone   Mid rows with green t-band x 15 (x 15 with red t-band but this was likely too easy for her)                       PT Education - 02/10/17 1521    Education  provided  Yes    Education Details  Added mid rows with band to her HEP.     Person(s) Educated  Patient    Methods  Explanation;Demonstration;Verbal cues;Handout    Comprehension  Verbalized understanding;Returned demonstration          PT Long Term Goals - 01/19/17 0831      PT LONG TERM GOAL #1   Title  Pt will be educated in and I with a home exercise program to promote UE strength, ROM, and flexibility and decrease pain.    Baseline  No HEP currently    Time  1    Period  Weeks    Status  New    Target Date  01/25/17      PT LONG TERM GOAL #2   Title  Pt will have an improved QuickDASH score by at least 50% indicating improved functional use of the R UE.    Baseline  29.54    Time  4    Period  Weeks    Status  New    Target Date  02/15/17      PT LONG TERM GOAL #3   Title  Pt will report that her shoulder pain is 0/10 at best and 3/10 at worst on 0-10 pain scale, thereby allowing her to perform household chores and ADLs with  less pain and difficulty.    Baseline  4-5/10 at best; 7-8/10 at worst on 0-10 pain scale at intial PT evaluation    Time  4    Period  Weeks    Status  New    Target Date  02/15/17      PT LONG TERM GOAL #4   Title  Pt will have full active and passive ROM in all planes at R shoulder, and 5/5 muscle strength throughout R UE, allowing her to perform ADLs such as drying her hair, with less pain and difficulty.    Baseline  Decreased AROM in flexion, IR, and abd (see objective section)    Time  4    Period  Weeks    Status  New    Target Date  02/15/17            Plan - 02/10/17 1522    Clinical Impression Statement  Patient noted to have most pain reproduction of her symptoms with middle trap MMT, not LT or UT. She is also noted to have tenderness through the rhomboids and MT which reproduce her symptoms. Provided manual therapy and exercise to target this area for symptom relief, will continue to monitor in follow up sessions.     Clinical Presentation  Stable    Clinical Decision Making  Moderate    Rehab Potential  Good    Clinical Impairments Affecting Rehab Potential  pt reports she has a small tear in the RTC    PT Frequency  2x / week    PT Duration  4 weeks    PT Treatment/Interventions  Cryotherapy;Electrical Stimulation;Moist Heat;Therapeutic activities;Therapeutic exercise;Patient/family education;Manual techniques;Passive range of motion    PT Next Visit Plan  Review HEP and advance as tolerated; Manual therapy to decrease pain and improve ROM    PT Home Exercise Plan  R shoulder isometrics for flexion, extension, abduction, IR/ER, and R shoulder pec stretch in doorway    Consulted and Agree with Plan of Care  Patient       Patient will benefit from skilled therapeutic intervention in order to improve the following deficits and  impairments:  Decreased range of motion, Decreased strength, Impaired flexibility, Impaired UE functional use, Pain  Visit Diagnosis: Acute  pain of right shoulder  Muscle weakness (generalized)     Problem List Patient Active Problem List   Diagnosis Date Noted  . Right shoulder pain 01/12/2017  . Tendinopathy of rotator cuff 12/20/2016  . Angle-closure glaucoma, severe stage 08/19/2015  . Migraine without aura and without status migrainosus, not intractable 01/13/2015  . High risk medications (not anticoagulants) long-term use 09/26/2014  . Nipple symptom or sign in female 05/10/2014  . Breast signs and symptoms 02/08/2014  . Acute gastroenteritis 05/07/2013  . Cerumen impaction 05/07/2013  . Routine general medical examination at a health care facility 07/27/2012  . Hyperlipidemia 09/29/2010  . URETEROPELVIC JUNCTION OBSTRUCTION, CONGENITAL 12/15/2009  . CONSTIPATION 12/11/2009  . ABDOMINAL BLOATING 12/11/2009  . KNEE PAIN, BILATERAL 12/19/2008  . CARDIAC MURMUR 11/26/2008  . MAGNETIC RESONANCE IMAGING, BRAIN, ABNORMAL 10/29/2008  . VENEREAL WART 10/23/2008  . Major depressive disorder, recurrent episode ([place]) 10/23/2008  . COMMON MIGRAINE 10/23/2008  . Essential hypertension 10/23/2008  . ALLERGIC RHINITIS 10/23/2008  . ASTHMA, INTERMITTENT, MILD 10/23/2008  . RENAL CALCULUS, HX OF 10/23/2008   Royce Macadamia PT, DPT, CSCS    02/10/2017, 3:24 PM  [facility] [address] [place], [address] Phone: [phone]   Fax:  [phone]  Name: ANN-MARIE KLUGE MRN: [id number] Date of Birth: 23-Dec-1955

## 2017-02-10 NOTE — Patient Instructions (Signed)
TDN to T4, and R upper trap/levator scapulae   CPAs and UPAs on R T6-T4  MT resisted -- reproduced her symptoms, not LT or hand at side in prone   Mid rows with green t-band x 15 (x 15 with red t-band but this was likely too easy for her)

## 2017-02-14 ENCOUNTER — Ambulatory Visit: Payer: Medicare Other | Admitting: Physical Therapy

## 2017-02-14 DIAGNOSIS — M6281 Muscle weakness (generalized): Secondary | ICD-10-CM | POA: Diagnosis not present

## 2017-02-14 DIAGNOSIS — F3181 Bipolar II disorder: Secondary | ICD-10-CM | POA: Diagnosis not present

## 2017-02-14 DIAGNOSIS — F411 Generalized anxiety disorder: Secondary | ICD-10-CM | POA: Diagnosis not present

## 2017-02-14 DIAGNOSIS — M25511 Pain in right shoulder: Secondary | ICD-10-CM

## 2017-02-14 NOTE — Patient Instructions (Signed)
Standing single arm cable rows with 10# on RUE x 12 for 3 sets   IR/ER MMT -- no pain, good strength. Shoulder extension MMT - painful and recreates her discomfort.   Seated rows 10# x 12, 15# x 12 for 2 sets (reported 4-5/10 achiness in pec major/minor clavicular head area, radiating towards scapula laterally   Isometric chest press at low MVIC% x 10 for 3-10" holds x 3 sets   Soft tissue mobilization into pec major/minor   Joint mobs A-P at distal clavicle

## 2017-02-14 NOTE — Therapy (Signed)
[facility] [address] [place], [address] Phone: [phone]   Fax:  [phone]  Physical Therapy Treatment  Patient Details  Name: ANITHA KREISER MRN: [id number] Date of Birth: 03-09-1956 Referring Provider: Teresa Coombs, DO   Encounter Date: 02/14/2017  PT End of Session - 02/14/17 1529    Visit Number  6    Number of Visits  8    Date for PT Re-Evaluation  02/18/17    PT Start Time  0945    PT Stop Time  1030    PT Time Calculation (min)  45 min    Activity Tolerance  Patient tolerated treatment well    Behavior During Therapy  [facility] for tasks assessed/performed       Past Medical History:  Diagnosis Date  . Allergy   . Anxiety   . Depression   . Hyperlipidemia   . Hypertension   . Kidney stones   . Persistent headaches   . Pneumonia   . Shingles outbreak 02/2014    Past Surgical History:  Procedure Laterality Date  . CARDIOVASCULAR STRESS TEST  02/2009   treadmill stress test: Low risk  . [place]  . EXPLORATORY LAPAROTOMY     laproscopy for infertility  . EYE SURGERY Bilateral    laser-correct opening b/tn cornea and iris  . MOLE REMOVAL  2017   x 2 moles    There were no vitals filed for this visit.  Subjective Assessment - 02/14/17 0957    Subjective  Patient reports she has had episodes where she feels no pain at all, but continues to get intermittent pain that radiates from the clavicular attachment of pec minor into shoulder blade.     Pertinent History  R RTC injury lifting child in car seat in June; pt states she injured the same shoulder in a similar fashion several years ago when pulling down a heavy SUV rear door    Limitations  Lifting;House hold activities    Patient Stated Goals  To be able to do all daily activities without having to modify them due to shoulder pain.    Currently in Pain?  No/denies       Standing single arm cable rows  with 10# on RUE x 12 for 3 sets   IR/ER MMT -- no pain, good strength. Shoulder extension MMT - painful and recreates her discomfort.   Seated rows 10# x 12, 15# x 12 for 2 sets (reported 4-5/10 achiness in pec major/minor clavicular head area, radiating towards scapula laterally   Isometric chest press at low MVIC% x 10 for 3-10" holds x 3 sets   Soft tissue mobilization into pec major/minor --patient reported this reproduced her symptoms and caused radiating symptoms wrapping around to her medial scapular area. Patient reported mild to moderate reduction in symptoms after completion of STM  Joint mobs A-P at distal clavicle -- grade I-II mobilizations at distal clavicle on R side x 3 bouts x 30", patient reported this was painful for her, did not appear to reduce her symptoms.                         PT Education - 02/14/17 1025    Education provided  Yes    Education Details  Will consider TDN to pecs if she is still having pain, otherwise isometrics and soft tissue mobilization are most appropriate at this time.  Person(s) Educated  Patient    Methods  Explanation;Demonstration    Comprehension  Verbalized understanding;Returned demonstration          PT Long Term Goals - 01/19/17 0831      PT LONG TERM GOAL #1   Title  Pt will be educated in and I with a home exercise program to promote UE strength, ROM, and flexibility and decrease pain.    Baseline  No HEP currently    Time  1    Period  Weeks    Status  New    Target Date  01/25/17      PT LONG TERM GOAL #2   Title  Pt will have an improved QuickDASH score by at least 50% indicating improved functional use of the R UE.    Baseline  29.54    Time  4    Period  Weeks    Status  New    Target Date  02/15/17      PT LONG TERM GOAL #3   Title  Pt will report that her shoulder pain is 0/10 at best and 3/10 at worst on 0-10 pain scale, thereby allowing her to perform household chores and ADLs with  less pain and difficulty.    Baseline  4-5/10 at best; 7-8/10 at worst on 0-10 pain scale at intial PT evaluation    Time  4    Period  Weeks    Status  New    Target Date  02/15/17      PT LONG TERM GOAL #4   Title  Pt will have full active and passive ROM in all planes at R shoulder, and 5/5 muscle strength throughout R UE, allowing her to perform ADLs such as drying her hair, with less pain and difficulty.    Baseline  Decreased AROM in flexion, IR, and abd (see objective section)    Time  4    Period  Weeks    Status  New    Target Date  02/15/17            Plan - 02/14/17 1530    Clinical Impression Statement  Patient appears to have pain radiating from clavicular attachment of pectorals, which radiates posteriorly to rhomboids/mid-trapezius area. She does not have severe pain onset with rows/posterior shoulder girdle loading exercises, but it is noted to last throughout session. She would likely benefit from [facility] to supraspinatus as this is tender to palpation this date as well as pec stretching, manual therapy, and isometric strengthening for pain management.,     Clinical Presentation  Stable    Clinical Decision Making  Moderate    Rehab Potential  Good    Clinical Impairments Affecting Rehab Potential  pt reports she has a small tear in the RTC    PT Frequency  2x / week    PT Duration  4 weeks    PT Treatment/Interventions  Cryotherapy;Electrical Stimulation;Moist Heat;Therapeutic activities;Therapeutic exercise;Patient/family education;Manual techniques;Passive range of motion    PT Next Visit Plan  Review HEP and advance as tolerated; Manual therapy to decrease pain and improve ROM    PT Home Exercise Plan  R shoulder isometrics for flexion, extension, abduction, IR/ER, and R shoulder pec stretch in doorway    Consulted and Agree with Plan of Care  Patient       Patient will benefit from skilled therapeutic intervention in order to improve the following deficits and  impairments:  Decreased range of motion, Decreased strength,  Impaired flexibility, Impaired UE functional use, Pain  Visit Diagnosis: Acute pain of right shoulder  Muscle weakness (generalized)     Problem List Patient Active Problem List   Diagnosis Date Noted  . Right shoulder pain 01/12/2017  . Tendinopathy of rotator cuff 12/20/2016  . Angle-closure glaucoma, severe stage 08/19/2015  . Migraine without aura and without status migrainosus, not intractable 01/13/2015  . High risk medications (not anticoagulants) long-term use 09/26/2014  . Nipple symptom or sign in female 05/10/2014  . Breast signs and symptoms 02/08/2014  . Acute gastroenteritis 05/07/2013  . Cerumen impaction 05/07/2013  . Routine general medical examination at a health care facility 07/27/2012  . Hyperlipidemia 09/29/2010  . URETEROPELVIC JUNCTION OBSTRUCTION, CONGENITAL 12/15/2009  . CONSTIPATION 12/11/2009  . ABDOMINAL BLOATING 12/11/2009  . KNEE PAIN, BILATERAL 12/19/2008  . CARDIAC MURMUR 11/26/2008  . MAGNETIC RESONANCE IMAGING, BRAIN, ABNORMAL 10/29/2008  . VENEREAL WART 10/23/2008  . Major depressive disorder, recurrent episode ([place]) 10/23/2008  . COMMON MIGRAINE 10/23/2008  . Essential hypertension 10/23/2008  . ALLERGIC RHINITIS 10/23/2008  . ASTHMA, INTERMITTENT, MILD 10/23/2008  . RENAL CALCULUS, HX OF 10/23/2008   Royce Macadamia PT, DPT, CSCS    02/14/2017, 3:32 PM  [facility] [address] [place], [address] Phone: [phone]   Fax:  [phone]  Name: YASLYN CUMBY MRN: [id number] Date of Birth: November 12, 1955

## 2017-02-16 ENCOUNTER — Ambulatory Visit: Payer: Medicare Other | Admitting: Physical Therapy

## 2017-02-21 ENCOUNTER — Ambulatory Visit: Payer: Medicare Other | Admitting: Physical Therapy

## 2017-02-21 DIAGNOSIS — M25511 Pain in right shoulder: Secondary | ICD-10-CM | POA: Diagnosis not present

## 2017-02-21 DIAGNOSIS — M6281 Muscle weakness (generalized): Secondary | ICD-10-CM | POA: Diagnosis not present

## 2017-02-23 ENCOUNTER — Ambulatory Visit: Payer: Medicare Other | Admitting: Sports Medicine

## 2017-02-23 NOTE — Therapy (Signed)
[facility] [address] [place], [address] Phone: [phone]   Fax:  [phone]  Physical Therapy Treatment  Patient Details  Name: Theresa Martin MRN: [id number] Date of Birth: 1955/11/17 Referring Provider: Teresa Coombs, DO   Encounter Date: 02/21/2017  PT End of Session - 02/23/17 1446    Visit Number  7    Number of Visits  16    Date for PT Re-Evaluation  03/30/17    PT Start Time  0945    PT Stop Time  1030    PT Time Calculation (min)  45 min    Activity Tolerance  Patient tolerated treatment well    Behavior During Therapy  [facility] for tasks assessed/performed       Past Medical History:  Diagnosis Date  . Allergy   . Anxiety   . Depression   . Hyperlipidemia   . Hypertension   . Kidney stones   . Persistent headaches   . Pneumonia   . Shingles outbreak 02/2014    Past Surgical History:  Procedure Laterality Date  . CARDIOVASCULAR STRESS TEST  02/2009   treadmill stress test: Low risk  . [place]  . EXPLORATORY LAPAROTOMY     laproscopy for infertility  . EYE SURGERY Bilateral    laser-correct opening b/tn cornea and iris  . MOLE REMOVAL  2017   x 2 moles    There were no vitals filed for this visit.  Subjective Assessment - 02/23/17 1442    Subjective  Patient reports she has not had much if any pain since previous PT session, though she has not really been using her LUE consistently. She will be seeing her doctor this week.     Pertinent History  R RTC injury lifting child in car seat in June; pt states she injured the same shoulder in a similar fashion several years ago when pulling down a heavy SUV rear door    Limitations  Lifting;House hold activities    Patient Stated Goals  To be able to do all daily activities without having to modify them due to shoulder pain.    Currently in Pain?  No/denies       Assessed ROM, MMT in flexion, IR, ER  passively as well. MMT was not indicative of any rotator cuff tear, she did report mild pain with abduction around supraspinatus.   Chest press in supine with 3# x 12 repetitions for 3 sets (mild increase in discomfort, recreating her pain)   Chest press machine on OMEGA 10# with blateral UEs x 10 repetitions for 3 sets (recreated her pain mildly after completion)                        PT Education - 02/23/17 1445    Education provided  Yes    Education Details  Provided written assessment to her MD about what therapist was noting through eval and follow ups.     Person(s) Educated  Patient    Methods  Explanation;Demonstration    Comprehension  Verbalized understanding;Returned demonstration          PT Long Term Goals - 01/19/17 0831      PT LONG TERM GOAL #1   Title  Pt will be educated in and I with a home exercise program to promote UE strength, ROM, and flexibility and decrease pain.    Baseline  No HEP currently  Time  1    Period  Weeks    Status  New    Target Date  01/25/17      PT LONG TERM GOAL #2   Title  Pt will have an improved QuickDASH score by at least 50% indicating improved functional use of the R UE.    Baseline  29.54    Time  4    Period  Weeks    Status  New    Target Date  02/15/17      PT LONG TERM GOAL #3   Title  Pt will report that her shoulder pain is 0/10 at best and 3/10 at worst on 0-10 pain scale, thereby allowing her to perform household chores and ADLs with less pain and difficulty.    Baseline  4-5/10 at best; 7-8/10 at worst on 0-10 pain scale at intial PT evaluation    Time  4    Period  Weeks    Status  New    Target Date  02/15/17      PT LONG TERM GOAL #4   Title  Pt will have full active and passive ROM in all planes at R shoulder, and 5/5 muscle strength throughout R UE, allowing her to perform ADLs such as drying her hair, with less pain and difficulty.    Baseline  Decreased AROM in flexion, IR, and  abd (see objective section)    Time  4    Period  Weeks    Status  New    Target Date  02/15/17            Plan - 2017/03/24 1446    Clinical Impression Statement  Patient continues to have irritable symptoms originating in pectoral area, radiating posteriorly to her scapula. Given the description of her symptoms, she appears to have some strain or discomfort of her serratus anterior. She is having less discomfort, though she has not been using her RUE for functional tasks or going to her workout routine. Provided written assessment for her MD, and will discuss further interventions at follow up.     Clinical Presentation  Stable    Clinical Decision Making  Moderate    Rehab Potential  Good    Clinical Impairments Affecting Rehab Potential  pt reports she has a small tear in the RTC    PT Frequency  2x / week    PT Duration  4 weeks    PT Treatment/Interventions  Cryotherapy;Electrical Stimulation;Moist Heat;Therapeutic activities;Therapeutic exercise;Patient/family education;Manual techniques;Passive range of motion    PT Next Visit Plan  Review HEP and advance as tolerated; Manual therapy to decrease pain and improve ROM    PT Home Exercise Plan  R shoulder isometrics for flexion, extension, abduction, IR/ER, and R shoulder pec stretch in doorway    Consulted and Agree with Plan of Care  Patient       Patient will benefit from skilled therapeutic intervention in order to improve the following deficits and impairments:  Decreased range of motion, Decreased strength, Impaired flexibility, Impaired UE functional use, Pain  Visit Diagnosis: Acute pain of right shoulder  Muscle weakness (generalized)   G-Codes - 03-24-2017 1449    Functional Assessment Tool Used (Outpatient Only)  Patient report     Carrying, Moving and Handling Objects Current Status (N2778)  At least 40 percent but less than 60 percent impaired, limited or restricted    Carrying, Moving and Handling Objects Goal  Status (E4235)  At least 1 percent  but less than 20 percent impaired, limited or restricted       Problem List Patient Active Problem List   Diagnosis Date Noted  . Right shoulder pain 01/12/2017  . Tendinopathy of rotator cuff 12/20/2016  . Angle-closure glaucoma, severe stage 08/19/2015  . Migraine without aura and without status migrainosus, not intractable 01/13/2015  . High risk medications (not anticoagulants) long-term use 09/26/2014  . Nipple symptom or sign in female 05/10/2014  . Breast signs and symptoms 02/08/2014  . Acute gastroenteritis 05/07/2013  . Cerumen impaction 05/07/2013  . Routine general medical examination at a health care facility 07/27/2012  . Hyperlipidemia 09/29/2010  . URETEROPELVIC JUNCTION OBSTRUCTION, CONGENITAL 12/15/2009  . CONSTIPATION 12/11/2009  . ABDOMINAL BLOATING 12/11/2009  . KNEE PAIN, BILATERAL 12/19/2008  . CARDIAC MURMUR 11/26/2008  . MAGNETIC RESONANCE IMAGING, BRAIN, ABNORMAL 10/29/2008  . VENEREAL WART 10/23/2008  . Major depressive disorder, recurrent episode ([place]) 10/23/2008  . COMMON MIGRAINE 10/23/2008  . Essential hypertension 10/23/2008  . ALLERGIC RHINITIS 10/23/2008  . ASTHMA, INTERMITTENT, MILD 10/23/2008  . RENAL CALCULUS, HX OF 10/23/2008   Royce Macadamia PT, DPT, CSCS    02/23/2017, 2:50 PM  [facility] [address], [place], [address] Phone: [phone]   Fax:  [phone]  Name: Theresa Martin MRN: [id number] Date of Birth: 01-29-1956

## 2017-02-24 ENCOUNTER — Ambulatory Visit: Payer: Medicare Other | Admitting: Physical Therapy

## 2017-02-25 ENCOUNTER — Ambulatory Visit (INDEPENDENT_AMBULATORY_CARE_PROVIDER_SITE_OTHER): Payer: Medicare Other | Admitting: Sports Medicine

## 2017-02-25 ENCOUNTER — Encounter: Payer: Self-pay | Admitting: Sports Medicine

## 2017-02-25 DIAGNOSIS — M9907 Segmental and somatic dysfunction of upper extremity: Secondary | ICD-10-CM

## 2017-02-25 DIAGNOSIS — M9901 Segmental and somatic dysfunction of cervical region: Secondary | ICD-10-CM | POA: Diagnosis not present

## 2017-02-25 DIAGNOSIS — M9908 Segmental and somatic dysfunction of rib cage: Secondary | ICD-10-CM | POA: Diagnosis not present

## 2017-02-25 NOTE — Progress Notes (Signed)
[name]. Willy Vorce, [facility] [phone]  [name] - 61 y.o. female MRN [id number]  Date of birth: 03-12-1956   Scribe for today's visit: Wendy Poet, ATC    SUBJECTIVE:  Theresa Martin is here for Follow-up (R shoulder pain and RC tendinopathy) .   Compared to the last office visit on 01/12/17, her previously described R shoulder symptoms are improving.  She states that she is still having pain but only 1-2x/day.  She states that she con't to have pain if she does too much w/ her R UE. Current symptoms are moderate & are nonradiating She has been going to PT and has completed 7 visits since her last visit w/ Dr. Paulla Fore.  Taking IBU 600 mg every other day.     ROS Reports night time disturbances. Reports fevers, chills, or night sweats. Denies unexplained weight loss. Denies personal history of cancer. Denies changes in bowel or bladder habits. Denies recent unreported falls. Denies new or worsening dyspnea or wheezing. Reports headaches or dizziness.  Reports numbness, tingling or weakness  In the extremities - L side only. Denies dizziness or presyncopal episodes Denies lower extremity edema    HISTORY & PERTINENT PRIOR DATA:  Prior History reviewed and updated per electronic medical record. Significant history, findings, studies and interim changes include: No additional findings.  reports that  has never smoked. she has never used smokeless tobacco. No results for input(s): HGBA1C, LABURIC, CREATINE in the last 8760 hours. No problems updated.   OBJECTIVE:  VS:  HT:5\' 1"  (154.9 cm)   WT:116 lb 3.2 oz (52.7 kg)  BMI:21.97    BP:120/68  HR:(!) 58bpm  TEMP: ( )  RESP:97 %  PHYSICAL EXAM: Constitutional: WDWN, Non-toxic appearing. Psychiatric: Alert & appropriately interactive. Not depressed or anxious appearing. Respiratory: No increased work of breathing. Trachea Midline Eyes: Pupils are  equal. EOM intact without nystagmus. No scleral icterus  UPPER EXTREMITIES No clubbing or cyanosis appreciated Capillary Refill is normal, less than 2 seconds No signficant upper extremity generalized edema Radial Pulses: Normal and symmetrically palpable Sensation in UE dermatomes: intact to light touch   Right Shoulder Exam: Normal alignment, Normal Contours No overlying erythema/ecchymosis. No pain with axial load and circumduction TTP over: Generalized periscapular pain and right costochondral rib pain No TTP over: Bony landmarks Internal Rotation: Normal External Rotation: Normal Empty can: Normal Hawkins: Normal Neers: Normal Speeds:Normal O'Brien's: Normal   Please see OMT note  ASSESSMENT & PLAN:   1. Segmental and somatic dysfunction of cervical region   2. Segmental and somatic dysfunction of upper extremity   3. Segmental and somatic dysfunction of rib cage    Plan: Right shoulder pain and right thoracic pain is likely related to poor thoracic mobility and rib restrictions.  Osteopathic manipulation performed today.  I would like for her to continue working with physical therapy to improve her range of motion and can recheck her in 4 weeks for consideration of repeat manipulation.  She had significant improvements in her range following this and overall her rotator cuff strength is significantly improved her shoulder does not seem to be the source of her pain any longer.  No problem-specific Assessment & Plan notes found for this encounter.   ++++++++++++++++++++++++++++++++++++++++++++ Orders:  Orders Placed This Encounter  Procedures  . OSTEOPATHIC MANIPULATION TREATMENT    Meds:  No orders of the defined types were placed in this encounter.   ++++++++++++++++++++++++++++++++++++++++++++ Follow-up: Return  in about 4 weeks (around 03/25/2017).   Pertinent documentation may be included in additional procedure notes, imaging studies, problem based  documentation and patient instructions. Please see these sections of the encounter for additional information regarding this visit. CMA/ATC served as Education administrator during this visit. History, Physical, and Plan performed by medical provider. Documentation and orders reviewed and attested to.      Gerda Diss, [facility] Physician

## 2017-02-28 NOTE — Procedures (Signed)
PROCEDURE NOTE : OSTEOPATHIC MANIPULATION The decision today to treat with Osteopathic Manipulative Therapy (OMT) was based on physical exam findings. Verbal consent was obtained after after explanation of risks, benefits and potential side effects, including acute pain flare, post manipulation soreness and need for repeat treatments.  Contraindications to OMT reviewed and include: NONE. Additional time was spent discussing the minimal risk of  injury to neurovascular structures for associated Cervical manipulation.  After verbal consent was obtained manipulation was performed as below:            Regions treated: Per examined regions as below and associated billing codes          Techniques used: [employer], MFR, HVLA and ART The patient tolerated the treatment well and reported Improved symptoms following treatment today. Patient was given medications, exercises, stretches and lifestyle modifications per AVS and verbally.     OSTEOPATHIC/STRUCTURAL EXAM FINDINGS:   C2 through C4 FRS left T1 FRS left T2 through T6 neutral side bent left, rotated right Posterior right ribs 7

## 2017-03-09 DIAGNOSIS — F3181 Bipolar II disorder: Secondary | ICD-10-CM | POA: Diagnosis not present

## 2017-03-09 DIAGNOSIS — F411 Generalized anxiety disorder: Secondary | ICD-10-CM | POA: Diagnosis not present

## 2017-03-16 ENCOUNTER — Telehealth: Payer: Self-pay | Admitting: Physical Therapy

## 2017-03-16 ENCOUNTER — Ambulatory Visit: Payer: Medicare Other | Admitting: Physical Therapy

## 2017-03-16 NOTE — Telephone Encounter (Signed)
Pt called front desk to report that she would not be able to make her appointment.  This was pt's last scheduled appointment.  Pt reports to front desk staff that she is doing well and feeling better and does not believe she needs further therapy.  Pt instructed to call clinic back if further therapy needs arise and pt verbalized agreement.  Collie Siad PT, DPT

## 2017-03-18 ENCOUNTER — Other Ambulatory Visit: Payer: Self-pay

## 2017-03-18 ENCOUNTER — Encounter: Payer: Self-pay | Admitting: Physician Assistant

## 2017-03-18 ENCOUNTER — Telehealth: Payer: Self-pay | Admitting: Family Medicine

## 2017-03-18 ENCOUNTER — Ambulatory Visit (INDEPENDENT_AMBULATORY_CARE_PROVIDER_SITE_OTHER): Payer: Medicare Other | Admitting: Physician Assistant

## 2017-03-18 VITALS — BP 130/80 | HR 57 | Temp 98.6°F | Resp 17 | Ht 61.0 in | Wt 116.0 lb

## 2017-03-18 DIAGNOSIS — J029 Acute pharyngitis, unspecified: Secondary | ICD-10-CM

## 2017-03-18 DIAGNOSIS — J069 Acute upper respiratory infection, unspecified: Secondary | ICD-10-CM | POA: Diagnosis not present

## 2017-03-18 LAB — POC INFLUENZA A&B (BINAX/QUICKVUE)
INFLUENZA B, POC: NEGATIVE
Influenza A, POC: NEGATIVE

## 2017-03-18 LAB — POCT RAPID STREP A (OFFICE): RAPID STREP A SCREEN: NEGATIVE

## 2017-03-18 NOTE — Telephone Encounter (Signed)
Copied from [place] [id number]. Topic: Quick Communication - See Telephone Encounter >> Mar 18, 2017  8:44 AM Ahmed Prima L wrote: CRM for notification. See Telephone encounter for:   03/18/17.  Pt states she has strept throat a couple months ago. She said she now has a sore throat, aching and a fever. (have had symptoms since wed) Patient would not let me make an appt, she wanted to know if something could be called into [facility] on [address]. She said she would come if needed but wanted to see if she could just send a script over first. Call back is [phone]

## 2017-03-18 NOTE — Progress Notes (Signed)
Patient presents to clinic today c/o 4.5 days of fatigue, aches, sore throat and nasal congestion. Denies recent travel or sick contact. Notes fever last night at 99. Has history of strep throat and is concerned about recurrence.  Past Medical History:  Diagnosis Date  . Allergy   . Anxiety   . Depression   . Hyperlipidemia   . Hypertension   . Kidney stones   . Persistent headaches   . Pneumonia   . Shingles outbreak 02/2014    Current Outpatient Medications on File Prior to Visit  Medication Sig Dispense Refill  . atorvastatin (LIPITOR) 20 MG tablet TAKE 1 TABLET (20 MG TOTAL) BY MOUTH DAILY. 30 tablet 6  . Calcium-Vitamin D-Vitamin K (CALCIUM + D + K PO) Take 2 each by mouth daily. Calcium 530m- Vitamin D 1000iu- K 436m   . clonazePAM (KLONOPIN) 0.5 MG tablet Take 0.25 mg by mouth at bedtime as needed.   2  . escitalopram (LEXAPRO) 10 MG tablet Take 10 mg by mouth daily.     . Fluocinolone-Emollient (FLUOCINOLONE CREAM & EMOLLIENT EX) Apply topically.    . Glucosamine-Chondroitin (MOVE FREE PO) Take 1 tablet by mouth daily.    . Marland Kitchenbuprofen (ADVIL,MOTRIN) 200 MG tablet Take 200 mg by mouth every 6 (six) hours as needed.    . Marland Kitchenetoconazole-Hydrocortisone 2 & 1 % KIT Apply topically as needed. Cracks on mouth    . lamoTRIgine (LAMICTAL) 100 MG tablet TAKE 10027mn the morning and 200 mg by mouth before bed.    . Melatonin 3 MG CAPS Take 1 capsule by mouth at bedtime.    . meloxicam (MOBIC) 15 MG tablet TAKE 1 TABLET BY MOUTH EVERY DAY 30 tablet 1  . Naproxen Sodium (ALEVE) 220 MG CAPS As needed for headache 60 each   . propranolol (INDERAL) 40 MG tablet Take 1 tablet (40 mg total) by mouth 2 (two) times daily. 180 tablet 4  . QUEtiapine (SEROQUEL XR) 300 MG 24 hr tablet Take 300 mg by mouth daily.     . traZODone (DESYREL) 50 MG tablet Take 50 mg by mouth at bedtime as needed for sleep.     . tMarland Kitcheniamterene-hydrochlorothiazide (MAXZIDE-25) 37.5-25 MG tablet TAKE 1 TABLET BY MOUTH  DAILY. 30 tablet 6   No current facility-administered medications on file prior to visit.     No Known Allergies  Family History  Problem Relation Age of Onset  . Hypertension Mother   . Coronary artery disease Mother   . Heart failure Mother   . Hypertension Father   . Liver disease Sister        liver failure  . Arrhythmia Brother   . Hypertension Brother   . Cancer Neg Hx        colon   . Breast cancer Neg Hx     Social History   Socioeconomic History  . Marital status: Legally Separated    Spouse name: None  . Number of children: 2  . Years of education: None  . Highest education level: None  Social Needs  . Financial resource strain: None  . Food insecurity - worry: None  . Food insecurity - inability: None  . Transportation needs - medical: None  . Transportation needs - non-medical: None  Occupational History  . 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 Smoking status: Never Smoker  . Smokeless tobacco: Never Used  Substance and Sexual Activity  . Alcohol use: No    Alcohol/week: 0.0  oz    Comment: rare  . Drug use: No  . Sexual activity: None  Other Topics Concern  . None  Social History Narrative   Regular exercise- yes, daily running, elliptical    Diet: fruits and veggies, water    No caffeine   Review of Systems - See HPI.  All other ROS are negative.  BP 130/80   Pulse (!) 57   Temp 98.6 F (37 C) (Oral)   Resp 17   Ht _0  (1.549 m)   Wt 116 lb (52.6 kg)   SpO2 96%   BMI 21.92 kg/m   Physical Exam  Constitutional: She is oriented to person, place, and time and well-developed, well-nourished, and in no distress.  HENT:  Head: Normocephalic and atraumatic.  Right Ear: External ear normal.  Left Ear: External ear normal.  Nose: Nose normal.  Mouth/Throat: Oropharynx is clear and moist. No oropharyngeal exudate.  TM within normal limits bilaterally.  Eyes: Conjunctivae are normal. Pupils are equal, round, and reactive to  light.  Neck: Neck supple.  Cardiovascular: Normal rate, regular rhythm, normal heart sounds and intact distal pulses.  Pulmonary/Chest: Effort normal and breath sounds normal. No respiratory distress. She has no wheezes. She has no rales. She exhibits no tenderness.  Lymphadenopathy:    She has no cervical adenopathy.  Neurological: She is alert and oriented to person, place, and time.  Skin: Skin is warm and dry. No rash noted.  Psychiatric: Affect normal.  Vitals reviewed.   Recent Results (from the past 2160 hour(s))  POCT rapid strep A     Status: Abnormal   Collection Time: 01/24/17  4:19 PM  Result Value Ref Range   Rapid Strep A Screen Positive (A) Negative    Assessment/Plan: 1. Sore throat Strep negative. Exam unremarkable. Suspect symptoms of Viral URI. See plan below. - POCT rapid strep A  2. Viral URI Flu swab and strep negative. Reviewed supportive measures and OTC medications. Reassurance given. Follow-up if not resolving.   - POC Influenza A&B(BINAX/QUICKVUE)   Leeanne Rio, PA-C

## 2017-03-18 NOTE — Patient Instructions (Addendum)
Stay well-hydrated and get plenty of rest. Start OTC Coricidin HBP for symptom relief. I also recommend an echinacea supplement and saline nasal rinses.  Symptoms will take up to 7-10 days to resolve but usually days 3-5 are the worst.  Please call or return to clinic if you note any new or worsening symptoms.   Viral Illness, Adult Viruses are tiny germs that can get into a person's body and cause illness. There are many different types of viruses, and they cause many types of illness. Viral illnesses can range from mild to severe. They can affect various parts of the body. Common illnesses that are caused by a virus include colds and the flu. Viral illnesses also include serious conditions such as HIV/AIDS (human immunodeficiency virus/acquired immunodeficiency syndrome). A few viruses have been linked to certain cancers. What are the causes? Many types of viruses can cause illness. Viruses invade cells in your body, multiply, and cause the infected cells to malfunction or die. When the cell dies, it releases more of the virus. When this happens, you develop symptoms of the illness, and the virus continues to spread to other cells. If the virus takes over the function of the cell, it can cause the cell to divide and grow out of control, as is the case when a virus causes cancer. Different viruses get into the body in different ways. You can get a virus by:  Swallowing food or water that is contaminated with the virus.  Breathing in droplets that have been coughed or sneezed into the air by an infected person.  Touching a surface that has been contaminated with the virus and then touching your eyes, nose, or mouth.  Being bitten by an insect or animal that carries the virus.  Having sexual contact with a person who is infected with the virus.  Being exposed to blood or fluids that contain the virus, either through an open cut or during a transfusion.  If a virus enters your body, your  body's defense system (immune system) will try to fight the virus. You may be at higher risk for a viral illness if your immune system is weak. What are the signs or symptoms? Symptoms vary depending on the type of virus and the location of the cells that it invades. Common symptoms of the main types of viral illnesses include: Cold and flu viruses  Fever.  Headache.  Sore throat.  Muscle aches.  Nasal congestion.  Cough. Digestive system (gastrointestinal) viruses  Fever.  Abdominal pain.  Nausea.  Diarrhea. Liver viruses (hepatitis)  Loss of appetite.  Tiredness.  Yellowing of the skin (jaundice). Brain and spinal cord viruses  Fever.  Headache.  Stiff neck.  Nausea and vomiting.  Confusion or sleepiness. Skin viruses  Warts.  Itching.  Rash. Sexually transmitted viruses  Discharge.  Swelling.  Redness.  Rash. How is this treated? Viruses can be difficult to treat because they live within cells. Antibiotic medicines do not treat viruses because these drugs do not get inside cells. Treatment for a viral illness may include:  Resting and drinking plenty of fluids.  Medicines to relieve symptoms. These can include over-the-counter medicine for pain and fever, medicines for cough or congestion, and medicines to relieve diarrhea.  Antiviral medicines. These drugs are available only for certain types of viruses. They may help reduce flu symptoms if taken early. There are also many antiviral medicines for hepatitis and HIV/AIDS.  Some viral illnesses can be prevented with vaccinations. A common example  is the flu shot. Follow these instructions at home: Medicines   Take over-the-counter and prescription medicines only as told by your health care provider.  If you were prescribed an antiviral medicine, take it as told by your health care provider. Do not stop taking the medicine even if you start to feel better.  Be aware of when antibiotics are  needed and when they are not needed. Antibiotics do not treat viruses. If your health care provider thinks that you may have a bacterial infection as well as a viral infection, you may get an antibiotic. ? Do not ask for an antibiotic prescription if you have been diagnosed with a viral illness. That will not make your illness go away faster. ? Frequently taking antibiotics when they are not needed can lead to antibiotic resistance. When this develops, the medicine no longer works against the bacteria that it normally fights. General instructions  Drink enough fluids to keep your urine clear or pale yellow.  Rest as much as possible.  Return to your normal activities as told by your health care provider. Ask your health care provider what activities are safe for you.  Keep all follow-up visits as told by your health care provider. This is important. How is this prevented? Take these actions to reduce your risk of viral infection:  Eat a healthy diet and get enough rest.  Wash your hands often with soap and water. This is especially important when you are in public places. If soap and water are not available, use hand sanitizer.  Avoid close contact with friends and family who have a viral illness.  If you travel to areas where viral gastrointestinal infection is common, avoid drinking water or eating raw food.  Keep your immunizations up to date. Get a flu shot every year as told by your health care provider.  Do not share toothbrushes, nail clippers, razors, or needles with other people.  Always practice safe sex.  Contact a health care provider if:  You have symptoms of a viral illness that do not go away.  Your symptoms come back after going away.  Your symptoms get worse. Get help right away if:  You have trouble breathing.  You have a severe headache or a stiff neck.  You have severe vomiting or abdominal pain. This information is not intended to replace advice given  to you by your health care provider. Make sure you discuss any questions you have with your health care provider. Document Released: 07/25/2015 Document Revised: 08/27/2015 Document Reviewed: 07/25/2015 Elsevier Interactive Patient Education  [employer].

## 2017-03-18 NOTE — Telephone Encounter (Signed)
Called patient at number listed.  Left voicemail advising patient to call back, explained that an appointment would be required to have her evaluated. / If patient calls back, she needs to schedule and appointment.

## 2017-03-21 ENCOUNTER — Ambulatory Visit (INDEPENDENT_AMBULATORY_CARE_PROVIDER_SITE_OTHER): Payer: Medicare Other | Admitting: Family Medicine

## 2017-03-21 ENCOUNTER — Encounter: Payer: Self-pay | Admitting: Family Medicine

## 2017-03-21 ENCOUNTER — Other Ambulatory Visit: Payer: Self-pay

## 2017-03-21 ENCOUNTER — Telehealth: Payer: Self-pay

## 2017-03-21 VITALS — BP 122/83 | HR 56 | Temp 98.7°F | Resp 17 | Ht 61.0 in | Wt 115.0 lb

## 2017-03-21 DIAGNOSIS — J069 Acute upper respiratory infection, unspecified: Secondary | ICD-10-CM

## 2017-03-21 DIAGNOSIS — R05 Cough: Secondary | ICD-10-CM | POA: Diagnosis not present

## 2017-03-21 DIAGNOSIS — R059 Cough, unspecified: Secondary | ICD-10-CM

## 2017-03-21 MED ORDER — ALBUTEROL SULFATE HFA 108 (90 BASE) MCG/ACT IN AERS: 2.0000 | INHALATION_SPRAY | Freq: Four times a day (QID) | RESPIRATORY_TRACT | 2 refills | Status: DC | PRN

## 2017-03-21 MED ORDER — PROMETHAZINE-DM 6.25-15 MG/5ML PO SYRP: 5.0000 mL | ORAL_SOLUTION | Freq: Four times a day (QID) | ORAL | 0 refills | Status: DC | PRN

## 2017-03-21 MED ORDER — AMOXICILLIN-POT CLAVULANATE 875-125 MG PO TABS: 1.0000 | ORAL_TABLET | Freq: Two times a day (BID) | ORAL | 0 refills | Status: DC

## 2017-03-21 MED ORDER — ALBUTEROL SULFATE (2.5 MG/3ML) 0.083% IN NEBU
2.5000 mg | INHALATION_SOLUTION | Freq: Once | RESPIRATORY_TRACT | Status: AC
  Administered 2017-03-21: 2.5 mg via RESPIRATORY_TRACT

## 2017-03-21 NOTE — Patient Instructions (Signed)
Follow up as needed or as scheduled START the Augmentin twice daily- take w/ food Drink plenty of fluids Use the cough medicine as needed (may cause drowsiness) REST! Use the Albuterol inhaler- 2 puffs every 4-6 hrs as needed for cough, shortness of breath, or wheezing Call with any questions or concerns Hang in there! Happy Holidays!

## 2017-03-21 NOTE — Progress Notes (Signed)
   Subjective:    Patient ID: Theresa Martin, female    DOB: Feb 20, 1956, 61 y.o.   MRN: [id number]  HPI URI- pt was seen 3 days ago and dx'd w/ viral illness after rapid strep and flu tests were negative.  Pt reports since then, she has developed chest tightness, cough is now productive of green sputum.  Ears and throat both hurt.  No fevers- Tm 99 last week.  + body aches.  No sinus pain/pressure.  + nasal congestion.   Review of Systems For ROS see HPI     Objective:   Physical Exam  Constitutional: She appears well-developed and well-nourished. No distress.  HENT:  Head: Normocephalic and atraumatic.  TMs normal bilaterally Mild nasal congestion Throat w/out erythema, edema, or exudate- + PND  Eyes: Conjunctivae and EOM are normal. Pupils are equal, round, and reactive to light.  Neck: Normal range of motion. Neck supple.  Cardiovascular: Normal rate, regular rhythm, normal heart sounds and intact distal pulses.  No murmur heard. Pulmonary/Chest: Effort normal. No respiratory distress. She has no wheezes. She has rales.  Initially poor air movement on exam but this improved s/p neb tx.  Neb tx revealed rhonchi in both R and L lower lobe  Lymphadenopathy:    She has no cervical adenopathy.  Vitals reviewed.         Assessment & Plan:  URI/possible PNA- deteriorated.  Pt was seen on Friday and dx'd w/ viral illness.  Today, decreased BS that improved s/p neb tx but neb tx also revealed rhonchi in the bases of both lungs.  Will treat preemptively as possible PNA w/ Augmentin, cough meds, and albuterol inhaler.  Reviewed supportive care and red flags that should prompt return.  Pt expressed understanding and is in agreement w/ plan.

## 2017-03-21 NOTE — Telephone Encounter (Signed)
Patient wanted to see PCP today because she has also developed red streaks in her throat and she states that she just does not feel well.  Patient coming in at 10:15 today to be seen.

## 2017-03-21 NOTE — Telephone Encounter (Signed)
After reading last week's note, it sounds as though pt's illness was viral and this can take 7-10 days to improve (and will often worsen before it gets better).  If she feels an abx is needed, we will need to see her.

## 2017-03-21 NOTE — Telephone Encounter (Signed)
Patient was seen last week for fatigue, aches, sore throat and nasal congestion.   States that she is no better - asking if there is any way something can be called in for her.   Routed to provider to advise.

## 2017-03-21 NOTE — Telephone Encounter (Signed)
Copied from [place]. Topic: General - Other >> Mar 21, 2017  8:37 AM Lolita Rieger, RMA wrote: Reason for CRM:Pt called and stated that her symptoms have gotten worse and would like to know if if something can be called in for her Please contact pt at [phone]

## 2017-03-25 ENCOUNTER — Ambulatory Visit: Payer: Medicare Other | Admitting: Sports Medicine

## 2017-03-30 ENCOUNTER — Ambulatory Visit (INDEPENDENT_AMBULATORY_CARE_PROVIDER_SITE_OTHER): Payer: Medicare Other | Admitting: Sports Medicine

## 2017-03-30 ENCOUNTER — Encounter: Payer: Self-pay | Admitting: Sports Medicine

## 2017-03-30 VITALS — BP 110/60 | HR 54 | Ht 61.0 in | Wt 116.0 lb

## 2017-03-30 DIAGNOSIS — M9908 Segmental and somatic dysfunction of rib cage: Secondary | ICD-10-CM

## 2017-03-30 DIAGNOSIS — M9901 Segmental and somatic dysfunction of cervical region: Secondary | ICD-10-CM

## 2017-03-30 DIAGNOSIS — M25511 Pain in right shoulder: Secondary | ICD-10-CM | POA: Diagnosis not present

## 2017-03-30 DIAGNOSIS — G8929 Other chronic pain: Secondary | ICD-10-CM

## 2017-03-30 DIAGNOSIS — M9902 Segmental and somatic dysfunction of thoracic region: Secondary | ICD-10-CM

## 2017-03-30 NOTE — Progress Notes (Signed)
Theresa Martin. Shrika Milos, [place] at [facility] [phone]  ETHIE CURLESS - 62 y.o. female MRN [id number]  Date of birth: March 07, 1956  Visit Date: 03/30/2017  PCP: [name], MD   Referred by: [name], MD   Scribe for today's visit: Wendy Poet, ATC     SUBJECTIVE:  Gearldine Shown Leh is here for Follow-up (Neck and R shoulder pain) .    Compared to the last office visit on 02/25/17, her previously described R shoulder symptoms are improving w/ much less pain noted after her last visit. Current symptoms are mild & are nonradiating and located mainly to her R scapular region. She has been taking IBU prn.  She has not gone back to PT since her last visit w/ Dr. Paulla Fore in late November 2018.   ROS Denies night time disturbances. Denies fevers, chills, or night sweats. Denies unexplained weight loss. Denies personal history of cancer. Denies changes in bowel or bladder habits. Denies recent unreported falls. Reports new or worsening dyspnea or wheezing.  Yes due to getting sick over the holidays. Reports headaches or dizziness. Some HAs Denies numbness, tingling or weakness  In the extremities.  Denies dizziness or presyncopal episodes Denies lower extremity edema     HISTORY & PERTINENT PRIOR DATA:  Prior History reviewed and updated per electronic medical record.  Significant history, findings, studies and interim changes include:  reports that  has never smoked. she has never used smokeless tobacco. No results for input(s): HGBA1C, LABURIC, CREATINE in the last 8760 hours. Pre-Visit Info 05/30/2015 9:51 AM  Medication: Reviewed & UTD with the patient.  Preferred Pharmacy and which med where: [id number] - [address], [place]   Allergies verified: NKA  Immunization Status: Flu vaccine-- Declines Tdap-- 07/27/12 PNA-- NA Shingles-- 09/26/14  A/P:   Changes to [place], PSH or Personal  Hx: UTD Pap-- 09/27/14 w/ Dr. Ammie Dalton at [facility]; normal; patient reported MMG-- 09/27/14 at [facility]; normal; pt. reported Bone Density-- per patient, it has not been completed. CCS-- 01/21/10 w/ Dr. Verl Blalock at [facility]; no polyps or cancers; otherwise normal; follow-up in 10 years.  Care Teams Updated:  Dr. Ammie Dalton - Gynecology  Dr. Andrey Spearman - Neurology  ED/Hospital/Urgent Care Visits: Per the patient, no recent visits to the ED/Hospital or Urgent Care.  To Discuss with Provider: No concerns at the time of call. Problem  Chronic Right Shoulder Pain   Multifactorial shoulder pain including scapular dyskinesis and rotator cuff tendinopathy.  She does have some underlying cervical changes that are minimal with her symptoms and has responded well to osteopathic manipulation   Tendinopathy of Rotator Cuff     OBJECTIVE:  VS:  HT:5\' 1"  (154.9 cm)   WT:116 lb (52.6 kg)  BMI:21.93    BP:110/60  HR:(!) 54bpm  TEMP: ( )  RESP:97 %   PHYSICAL EXAM: Constitutional: WDWN, Non-toxic appearing. Psychiatric: Alert & appropriately interactive. Not depressed or anxious appearing. Respiratory: No increased work of breathing. Trachea Midline Eyes: Pupils are equal. EOM intact without nystagmus. No scleral icterus Cardiovascular:  Peripheral Pulses: peripheral pulses symmetrical No clubbing or cyanosis appreciated Capillary Refill is normal, less than 2 seconds No signficant generalized edema/anasarca Sensory Exam: intact to light touch   Full overhead range of motion of the right shoulder.  She has no significant upper extremity dysesthesia.  Cervical sidebending and rotation overall well-maintained range of motion  with only slight functional limitations per procedure note.  Negative Spurling's compression test, normal VBS testing. No additional findings.   ASSESSMENT & PLAN:   1. Chronic right shoulder pain   2. Segmental and somatic  dysfunction of cervical region   3. Segmental and somatic dysfunction of thoracic region   4. Segmental and somatic dysfunction of rib cage    PLAN:    Chronic right shoulder pain Repeat osteopathic manipulation performed again today with good improvements once again.  She will continue with home therapeutic exercises previously outlined and as reviewed per procedure note today.  She will look into obtaining cervical traction device over-the-counter and we will plan to follow-up with her in 8 weeks as needed or as needed.   ++++++++++++++++++++++++++++++++++++++++++++ Orders & Meds: Orders Placed This Encounter  Procedures  . OSTEOPATHIC MANIPULATION TREATMENT  . Misc procedure    No orders of the defined types were placed in this encounter.   ++++++++++++++++++++++++++++++++++++++++++++ Follow-up: Return in about 8 weeks (around 05/25/2017).   Pertinent documentation may be included in additional procedure notes, imaging studies, problem based documentation and patient instructions. Please see these sections of the encounter for additional information regarding this visit. CMA/ATC served as Education administrator during this visit. History, Physical, and Plan performed by medical provider. Documentation and orders reviewed and attested to.      Gerda Diss, [facility] Physician

## 2017-03-30 NOTE — Procedures (Signed)
PROCEDURE NOTE : OSTEOPATHIC MANIPULATION The decision today to treat with Osteopathic Manipulative Therapy (OMT) was based on physical exam findings. Verbal consent was obtained after after explanation of risks, benefits and potential side effects, including acute pain flare, post manipulation soreness and need for repeat treatments.   Additional time was spent discussing the minimal risk of  injury to neurovascular structures for associated Cervical manipulation.  After verbal consent was obtained manipulation was performed as below:  Contraindications to OMT reviewed and include: NONE.             Regions treated: Per examined regions as below and associated billing codes          Techniques used: [employer], MFR, HVLA and ART The patient tolerated the treatment well and reported Improved symptoms following treatment today. Patient was given medications, exercises, stretches and lifestyle modifications per AVS and verbally.     OSTEOPATHIC/STRUCTURAL EXAM FINDINGS:    OA rotated left  C3 rotated right  C4 through C6 rotated left  T1 extended rotated right  T3 FRS right  T4 through T6 neutral rotated left, side bent right  Posterior rib 7 on the left

## 2017-03-30 NOTE — Assessment & Plan Note (Signed)
Repeat osteopathic manipulation performed again today with good improvements once again.  She will continue with home therapeutic exercises previously outlined and as reviewed per procedure note today.  She will look into obtaining cervical traction device over-the-counter and we will plan to follow-up with her in 8 weeks as needed or as needed.

## 2017-03-30 NOTE — Procedures (Signed)
PROCEDURE NOTE: THERAPEUTIC EXERCISES (97110) 15 minutes spent for Therapeutic exercises as below and as referenced in the AVS. This included exercises focusing on stretching, strengthening, with significant focus on eccentric aspects.  Proper technique shown and discussed handout in great detail with ATC. All questions were discussed and answered.   Long term goals include an improvement in range of motion, strength, endurance as well as avoiding reinjury. Frequency of visits is one time as determined during today's  office visit. Frequency of exercises to be performed is as per handout.  EXERCISES REVIEWED:  Theresa Martin exercises  Towel stretching  Home traction

## 2017-03-30 NOTE — Patient Instructions (Addendum)
Please perform the exercise program that we have prepared for you and gone over in detail on a daily basis.  In addition to the handout you were provided you can access your program through: [email or web address]   Your unique program code is: [email or web address]   Also check out [employer]" which is a program developed by Dr. Minerva Ends.   There are links to a couple of his YouTube Videos below and I would like to see performing one of his videos 5-6 days per week.    A good intro video is: "Independence from Pain 7-minute Video" - [email or web address]   His more advanced video is: "Powerful Posture and Pain Relief: 12 minutes of Foundation Training" - https://youtu.be/[email or web address]  Do not try to attempt this entire video when first beginning.    Try breaking each exercise that he does into shorter segments.  Otherwise if they perform an exercise for 45 seconds, start with 15 seconds and rest and then resume when they begin the new activity.    If you work your way up to doing this 12 minute video, I expect you will see significant improvements in your pain.  If you enjoy his videos and would like to find out more you can look on his website: [email or web address]  He has a workout streaming option as well as a DVD set available for purchase.  Amazon has the best price for his DVDs.

## 2017-04-14 DIAGNOSIS — L578 Other skin changes due to chronic exposure to nonionizing radiation: Secondary | ICD-10-CM | POA: Diagnosis not present

## 2017-04-14 DIAGNOSIS — Z86018 Personal history of other benign neoplasm: Secondary | ICD-10-CM | POA: Diagnosis not present

## 2017-04-14 DIAGNOSIS — Z1283 Encounter for screening for malignant neoplasm of skin: Secondary | ICD-10-CM | POA: Diagnosis not present

## 2017-05-03 DIAGNOSIS — F3181 Bipolar II disorder: Secondary | ICD-10-CM | POA: Diagnosis not present

## 2017-05-03 DIAGNOSIS — F411 Generalized anxiety disorder: Secondary | ICD-10-CM | POA: Diagnosis not present

## 2017-05-03 DIAGNOSIS — Z79899 Other long term (current) drug therapy: Secondary | ICD-10-CM | POA: Diagnosis not present

## 2017-05-25 ENCOUNTER — Ambulatory Visit: Payer: Medicare Other | Admitting: Sports Medicine

## 2017-05-27 ENCOUNTER — Ambulatory Visit: Payer: Medicare Other | Admitting: Sports Medicine

## 2017-05-31 DIAGNOSIS — F3181 Bipolar II disorder: Secondary | ICD-10-CM | POA: Diagnosis not present

## 2017-05-31 DIAGNOSIS — F411 Generalized anxiety disorder: Secondary | ICD-10-CM | POA: Diagnosis not present

## 2017-06-01 ENCOUNTER — Ambulatory Visit (INDEPENDENT_AMBULATORY_CARE_PROVIDER_SITE_OTHER): Payer: Medicare Other | Admitting: Sports Medicine

## 2017-06-01 ENCOUNTER — Encounter: Payer: Self-pay | Admitting: Sports Medicine

## 2017-06-01 VITALS — BP 104/70 | HR 49 | Ht 61.0 in | Wt 114.0 lb

## 2017-06-01 DIAGNOSIS — M9908 Segmental and somatic dysfunction of rib cage: Secondary | ICD-10-CM | POA: Diagnosis not present

## 2017-06-01 DIAGNOSIS — M9901 Segmental and somatic dysfunction of cervical region: Secondary | ICD-10-CM

## 2017-06-01 DIAGNOSIS — M25511 Pain in right shoulder: Secondary | ICD-10-CM

## 2017-06-01 DIAGNOSIS — M9902 Segmental and somatic dysfunction of thoracic region: Secondary | ICD-10-CM | POA: Diagnosis not present

## 2017-06-01 DIAGNOSIS — G8929 Other chronic pain: Secondary | ICD-10-CM | POA: Diagnosis not present

## 2017-06-01 NOTE — Progress Notes (Signed)
Theresa Martin. Theresa Martin, [place] at [facility] [phone]  Theresa Martin - 62 y.o. female MRN [id number]  Date of birth: July 27, 1955  Visit Date: 06/01/2017  PCP: [name], MD   Referred by: [name], MD   Scribe for today's visit: Josepha Pigg, CMA     SUBJECTIVE:  Theresa Martin is here for Follow-up (R shoulder pain)  Compared to the last office visit, her previously described symptoms are improving. Still has some pain when lifting heavy objects. She has started lifting light weights at the gym to strengthen her shoulder.  Current symptoms are mild & are nonradiating She has been taking Advil or Meloxicam prn for the pain with some relief. She feels that she did benefit substantially from OMT at last visit.    ROS Denies night time disturbances. Denies fevers, chills, or night sweats. Denies unexplained weight loss. Denies personal history of cancer. Denies changes in bowel or bladder habits. Denies recent unreported falls. Denies new or worsening dyspnea or wheezing. Reports headaches.  Denies numbness, tingling or weakness  In the extremities.  Denies dizziness or presyncopal episodes Denies lower extremity edema    HISTORY & PERTINENT PRIOR DATA:  Prior History reviewed and updated per electronic medical record.  Significant history, findings, studies and interim changes include:  reports that  has never smoked. she has never used smokeless tobacco. No results for input(s): HGBA1C, LABURIC, CREATINE in the last 8760 hours. Pre-Visit Info 05/30/2015 9:51 AM  Medication: Reviewed & UTD with the patient.  Preferred Pharmacy and which med where: [id number] - [place] Martin, [place]   Allergies verified: NKA  Immunization Status: Flu vaccine-- Declines Tdap-- 07/27/12 PNA-- NA Shingles-- 09/26/14  A/P:   Changes to [place], PSH or Personal Hx: UTD Pap-- 09/27/14 w/ Dr. Ammie Dalton  at [facility]; normal; patient reported MMG-- 09/27/14 at [facility]; normal; pt. reported Bone Density-- per patient, it has not been completed. CCS-- 01/21/10 w/ Dr. Verl Blalock at [facility]; no polyps or cancers; otherwise normal; follow-up in 10 years.  Care Teams Updated:  Dr. Ammie Dalton - Gynecology  Dr. Andrey Spearman - Neurology  ED/Hospital/Urgent Care Visits: Per the patient, no recent visits to the ED/Hospital or Urgent Care.  To Discuss with Provider: No concerns at the time of call. Problem  Chronic Right Shoulder Pain   Multifactorial shoulder pain including scapular dyskinesis and rotator cuff tendinopathy.  She does have some underlying cervical changes that are minimal with her symptoms and has responded well to osteopathic manipulation     OBJECTIVE:  VS:  HT:5\' 1"  (154.9 cm)   WT:114 lb (51.7 kg)  BMI:21.55    BP:104/70  HR:(!) 49bpm  TEMP: ( )  RESP:98 %   PHYSICAL EXAM: Constitutional: WDWN, Non-toxic appearing. Psychiatric: Alert & appropriately interactive.  Not depressed or anxious appearing. Respiratory: No increased work of breathing.  Trachea Midline Eyes: Pupils are equal.  EOM intact without nystagmus.  No scleral icterus  NEUROVASCULAR exam: No clubbing or cyanosis appreciated No significant venous stasis changes Capillary Refill: normal, less than 2 seconds   Right shoulder overall well aligned patella slightly elevated and protracted but improved compared to the past.  She has good overhead range of motion as well as internal rotation to L2 bilaterally.  Internal rotation external rotation strength is symmetric with persistent slight weakness with external rotation 4 out of 5.  No significant  pain with Hawkins, Neer's, speeds test O'Brien's testing.  Cervical range of motion improved but still persistently limited with cervical sidebending and rotation per osteopathic exam.  Negative Spurling's and Lhermitte's  compression test   ASSESSMENT & PLAN:   1. Chronic right shoulder pain   2. Segmental and somatic dysfunction of cervical region   3. Segmental and somatic dysfunction of thoracic region   4. Segmental and somatic dysfunction of rib cage    PLAN:   No additional findings.  Chronic right shoulder pain She is doing overall better but continues to have some posterior shoulder pain and periscapular pain.  She reports good improvement with osteopathic manipulation in the past and is requesting this to be repeated today.  Please see procedure note for this.  From her therapeutic exercise standpoint I am comfortable with her beginning to increase her weight slightly but would like for her to continue to avoid significant overhead activities.  We will plan to follow-up with her in 6 weeks to ensure clinical improvement and if any persistent ongoing symptoms can consider repeat manipulation.   Follow-up: Return in about 6 weeks (around 07/13/2017) for consideration of repeat osteopathic manipulation.   Pertinent documentation may be included in additional procedure notes, imaging studies, problem based documentation and patient instructions. Please see these sections of the encounter for additional information regarding this visit. CMA/ATC served as Education administrator during this visit. History, Physical, and Plan performed by medical provider. Documentation and orders reviewed and attested to.      Theresa Martin, [facility] Physician

## 2017-06-01 NOTE — Procedures (Signed)
PROCEDURE NOTE : OSTEOPATHIC MANIPULATION The decision today to treat with Osteopathic Manipulative Therapy (OMT) was based on physical exam findings. Verbal consent was obtained following a discussion with the patient regarding the of risks, benefits and potential side effects, including an acute pain flare,post manipulation soreness and need for repeat treatments. Additionally, we specifically discussed the minimal risk of  injury to neurovascular structures associated with Cervical manipulation.   NONE  Manipulation was performed as below: Regions Treated: cervial spine, thoracic spine and ribs Techniques used: HVLA, muscle energy, myofascial release and Articulatory   The patient tolerated the treatment well and reported Improved symptoms following treatment today. Patient was given medications, exercises, stretches and lifestyle modifications per AVS and verbally.     OSTEOPATHIC/STRUCTURAL EXAM FINDINGS:   Head Tilt:  right Restricted Shoulder:  right  SOMATIC DYSFUNCTION         Cervical:  C2, Extension, Side bend: right and Rotation: right C5, Flexion, Side bend: right and Rotation: right C7, Flexion, Side bend: left and Rotation: left        Thoracic:  T2, Flexion, Side bend: right and Rotation: right T4, - T6 N rRsL T8, FRS LEFT       RIBS: Posterior rib 3, right

## 2017-06-01 NOTE — Assessment & Plan Note (Signed)
She is doing overall better but continues to have some posterior shoulder pain and periscapular pain.  She reports good improvement with osteopathic manipulation in the past and is requesting this to be repeated today.  Please see procedure note for this.  From her therapeutic exercise standpoint I am comfortable with her beginning to increase her weight slightly but would like for her to continue to avoid significant overhead activities.  We will plan to follow-up with her in 6 weeks to ensure clinical improvement and if any persistent ongoing symptoms can consider repeat manipulation.

## 2017-06-08 ENCOUNTER — Other Ambulatory Visit: Payer: Self-pay

## 2017-06-08 ENCOUNTER — Ambulatory Visit (INDEPENDENT_AMBULATORY_CARE_PROVIDER_SITE_OTHER): Payer: Medicare Other

## 2017-06-08 ENCOUNTER — Ambulatory Visit (INDEPENDENT_AMBULATORY_CARE_PROVIDER_SITE_OTHER): Payer: Medicare Other | Admitting: Family Medicine

## 2017-06-08 ENCOUNTER — Encounter: Payer: Self-pay | Admitting: Family Medicine

## 2017-06-08 VITALS — BP 130/62 | HR 46 | Temp 97.8°F | Resp 16 | Ht 61.0 in | Wt 114.8 lb

## 2017-06-08 DIAGNOSIS — F331 Major depressive disorder, recurrent, moderate: Secondary | ICD-10-CM

## 2017-06-08 DIAGNOSIS — E785 Hyperlipidemia, unspecified: Secondary | ICD-10-CM | POA: Diagnosis not present

## 2017-06-08 DIAGNOSIS — E2839 Other primary ovarian failure: Secondary | ICD-10-CM

## 2017-06-08 DIAGNOSIS — Z23 Encounter for immunization: Secondary | ICD-10-CM | POA: Diagnosis not present

## 2017-06-08 DIAGNOSIS — I10 Essential (primary) hypertension: Secondary | ICD-10-CM

## 2017-06-08 DIAGNOSIS — Z0000 Encounter for general adult medical examination without abnormal findings: Secondary | ICD-10-CM | POA: Diagnosis not present

## 2017-06-08 DIAGNOSIS — M8580 Other specified disorders of bone density and structure, unspecified site: Secondary | ICD-10-CM | POA: Diagnosis not present

## 2017-06-08 MED ORDER — ZOSTER VAC RECOMB ADJUVANTED 50 MCG/0.5ML IM SUSR: 0.5000 mL | Freq: Once | INTRAMUSCULAR | 1 refills | Status: AC

## 2017-06-08 NOTE — Assessment & Plan Note (Signed)
Chronic problem.  Following w/ Dr Dorann Ou.  Excited to resume counseling.  Will follow along.

## 2017-06-08 NOTE — Patient Instructions (Addendum)
Shingles vaccine at pharmacy.   Schedule bone scan.   Bring a copy of your living will and/or healthcare power of attorney to your next office visit.  Continue doing brain stimulating activities (puzzles, reading, adult coloring books, staying active) to keep memory sharp.    Health Maintenance, Female Adopting a healthy lifestyle and getting preventive care can go a long way to promote health and wellness. Talk with your health care provider about what schedule of regular examinations is right for you. This is a good chance for you to check in with your provider about disease prevention and staying healthy. In between checkups, there are plenty of things you can do on your own. Experts have done a lot of research about which lifestyle changes and preventive measures are most likely to keep you healthy. Ask your health care provider for more information. Weight and diet Eat a healthy diet  Be sure to include plenty of vegetables, fruits, low-fat dairy products, and lean protein.  Do not eat a lot of foods high in solid fats, added sugars, or salt.  Get regular exercise. This is one of the most important things you can do for your health. ? Most adults should exercise for at least 150 minutes each week. The exercise should increase your heart rate and make you sweat (moderate-intensity exercise). ? Most adults should also do strengthening exercises at least twice a week. This is in addition to the moderate-intensity exercise.  Maintain a healthy weight  Body mass index (BMI) is a measurement that can be used to identify possible weight problems. It estimates body fat based on height and weight. Your health care provider can help determine your BMI and help you achieve or maintain a healthy weight.  For females 20 years of age and older: ? A BMI below 18.5 is considered underweight. ? A BMI of 18.5 to 24.9 is normal. ? A BMI of 25 to 29.9 is considered overweight. ? A BMI of 30 and above  is considered obese.  Watch levels of cholesterol and blood lipids  You should start having your blood tested for lipids and cholesterol at 63 years of age, then have this test every 5 years.  You may need to have your cholesterol levels checked more often if: ? Your lipid or cholesterol levels are high. ? You are older than 63 years of age. ? You are at high risk for heart disease.  Cancer screening Lung Cancer  Lung cancer screening is recommended for adults 55-80 years old who are at high risk for lung cancer because of a history of smoking.  A yearly low-dose CT scan of the lungs is recommended for people who: ? Currently smoke. ? Have quit within the past 15 years. ? Have at least a 30-pack-year history of smoking. A pack year is smoking an average of one pack of cigarettes a day for 1 year.  Yearly screening should continue until it has been 15 years since you quit.  Yearly screening should stop if you develop a health problem that would prevent you from having lung cancer treatment.  Breast Cancer  Practice breast self-awareness. This means understanding how your breasts normally appear and feel.  It also means doing regular breast self-exams. Let your health care provider know about any changes, no matter how small.  If you are in your 20s or 30s, you should have a clinical breast exam (CBE) by a health care provider every 1-3 years as part of a regular health   If you are 40 or older, have a CBE every year. Also consider having a breast X-ray (mammogram) every year.  If you have a family history of breast cancer, talk to your health care provider about genetic screening.  If you are at high risk for breast cancer, talk to your health care provider about having an MRI and a mammogram every year.  Breast cancer gene (BRCA) assessment is recommended for women who have family members with BRCA-related cancers. BRCA-related cancers  include: ? Breast. ? Ovarian. ? Tubal. ? Peritoneal cancers.  Results of the assessment will determine the need for genetic counseling and BRCA1 and BRCA2 testing.  Cervical Cancer Your health care provider may recommend that you be screened regularly for cancer of the pelvic organs (ovaries, uterus, and vagina). This screening involves a pelvic examination, including checking for microscopic changes to the surface of your cervix (Pap test). You may be encouraged to have this screening done every 3 years, beginning at age 21.  For women ages 30-65, health care providers may recommend pelvic exams and Pap testing every 3 years, or they may recommend the Pap and pelvic exam, combined with testing for human papilloma virus (HPV), every 5 years. Some types of HPV increase your risk of cervical cancer. Testing for HPV may also be done on women of any age with unclear Pap test results.  Other health care providers may not recommend any screening for nonpregnant women who are considered low risk for pelvic cancer and who do not have symptoms. Ask your health care provider if a screening pelvic exam is right for you.  If you have had past treatment for cervical cancer or a condition that could lead to cancer, you need Pap tests and screening for cancer for at least 20 years after your treatment. If Pap tests have been discontinued, your risk factors (such as having a new sexual partner) need to be reassessed to determine if screening should resume. Some women have medical problems that increase the chance of getting cervical cancer. In these cases, your health care provider may recommend more frequent screening and Pap tests.  Colorectal Cancer  This type of cancer can be detected and often prevented.  Routine colorectal cancer screening usually begins at 63 years of age and continues through 63 years of age.  Your health care provider may recommend screening at an earlier age if you have risk factors  for colon cancer.  Your health care provider may also recommend using home test kits to check for hidden blood in the stool.  A small camera at the end of a tube can be used to examine your colon directly (sigmoidoscopy or colonoscopy). This is done to check for the earliest forms of colorectal cancer.  Routine screening usually begins at age 50.  Direct examination of the colon should be repeated every 5-10 years through 63 years of age. However, you may need to be screened more often if early forms of precancerous polyps or small growths are found.  Skin Cancer  Check your skin from head to toe regularly.  Tell your health care provider about any new moles or changes in moles, especially if there is a change in a mole's shape or color.  Also tell your health care provider if you have a mole that is larger than the size of a pencil eraser.  Always use sunscreen. Apply sunscreen liberally and repeatedly throughout the day.  Protect yourself by wearing long sleeves, pants, a wide-brimmed hat, and   sunglasses whenever you are outside.  Heart disease, diabetes, and high blood pressure  High blood pressure causes heart disease and increases the risk of stroke. High blood pressure is more likely to develop in: ? People who have blood pressure in the high end of the normal range (130-139/85-89 mm Hg). ? People who are overweight or obese. ? People who are African American.  If you are 25-70 years of age, have your blood pressure checked every 3-5 years. If you are 86 years of age or older, have your blood pressure checked every year. You should have your blood pressure measured twice-once when you are at a hospital or clinic, and once when you are not at a hospital or clinic. Record the average of the two measurements. To check your blood pressure when you are not at a hospital or clinic, you can use: ? An automated blood pressure machine at a pharmacy. ? A home blood pressure monitor.  If  you are between 34 years and 33 years old, ask your health care provider if you should take aspirin to prevent strokes.  Have regular diabetes screenings. This involves taking a blood sample to check your fasting blood sugar level. ? If you are at a normal weight and have a low risk for diabetes, have this test once every three years after 63 years of age. ? If you are overweight and have a high risk for diabetes, consider being tested at a younger age or more often. Preventing infection Hepatitis B  If you have a higher risk for hepatitis B, you should be screened for this virus. You are considered at high risk for hepatitis B if: ? You were born in a country where hepatitis B is common. Ask your health care provider which countries are considered high risk. ? Your parents were born in a high-risk country, and you have not been immunized against hepatitis B (hepatitis B vaccine). ? You have HIV or AIDS. ? You use needles to inject street drugs. ? You live with someone who has hepatitis B. ? You have had sex with someone who has hepatitis B. ? You get hemodialysis treatment. ? You take certain medicines for conditions, including cancer, organ transplantation, and autoimmune conditions.  Hepatitis C  Blood testing is recommended for: ? Everyone born from [birth year] through 1966. ? Anyone with known risk factors for hepatitis C.  Sexually transmitted infections (STIs)  You should be screened for sexually transmitted infections (STIs) including gonorrhea and chlamydia if: ? You are sexually active and are younger than 63 years of age. ? You are older than 63 years of age and your health care provider tells you that you are at risk for this type of infection. ? Your sexual activity has changed since you were last screened and you are at an increased risk for chlamydia or gonorrhea. Ask your health care provider if you are at risk.  If you do not have HIV, but are at risk, it may be recommended  that you take a prescription medicine daily to prevent HIV infection. This is called pre-exposure prophylaxis (PrEP). You are considered at risk if: ? You are sexually active and do not regularly use condoms or know the HIV status of your partner(s). ? You take drugs by injection. ? You are sexually active with a partner who has HIV.  Talk with your health care provider about whether you are at high risk of being infected with HIV. If you choose to begin PrEP, you  should first be tested for HIV. You should then be tested every 3 months for as long as you are taking PrEP. Pregnancy  If you are premenopausal and you may become pregnant, ask your health care provider about preconception counseling.  If you may become pregnant, take 400 to 800 micrograms (mcg) of folic acid every day.  If you want to prevent pregnancy, talk to your health care provider about birth control (contraception). Osteoporosis and menopause  Osteoporosis is a disease in which the bones lose minerals and strength with aging. This can result in serious bone fractures. Your risk for osteoporosis can be identified using a bone density scan.  If you are 65 years of age or older, or if you are at risk for osteoporosis and fractures, ask your health care provider if you should be screened.  Ask your health care provider whether you should take a calcium or vitamin D supplement to lower your risk for osteoporosis.  Menopause may have certain physical symptoms and risks.  Hormone replacement therapy may reduce some of these symptoms and risks. Talk to your health care provider about whether hormone replacement therapy is right for you. Follow these instructions at home:  Schedule regular health, dental, and eye exams.  Stay current with your immunizations.  Do not use any tobacco products including cigarettes, chewing tobacco, or electronic cigarettes.  If you are pregnant, do not drink alcohol.  If you are  breastfeeding, limit how much and how often you drink alcohol.  Limit alcohol intake to no more than 1 drink per day for nonpregnant women. One drink equals 12 ounces of beer, 5 ounces of wine, or 1 ounces of hard liquor.  Do not use street drugs.  Do not share needles.  Ask your health care provider for help if you need support or information about quitting drugs.  Tell your health care provider if you often feel depressed.  Tell your health care provider if you have ever been abused or do not feel safe at home. This information is not intended to replace advice given to you by your health care provider. Make sure you discuss any questions you have with your health care provider. Document Released: 09/28/2010 Document Revised: 08/21/2015 Document Reviewed: 12/17/2014 Elsevier Interactive Patient Education  2018 [employer]  

## 2017-06-08 NOTE — Assessment & Plan Note (Signed)
Chronic problem.  Adequate control today.  Asymptomatic.  Check labs.  No anticipated med changes.  Will follow. 

## 2017-06-08 NOTE — Patient Instructions (Signed)
Follow up in 6 months to recheck BP and cholesterol We'll notify you of your lab results and make any changes if needed Keep up the good work!  You look great! Call with any questions or concerns Happy Spring!!!

## 2017-06-08 NOTE — Progress Notes (Signed)
   Subjective:    Patient ID: DONNA SNOOKS, female    DOB: 11/11/1955, 62 y.o.   MRN: [id number]  HPI HTN- chronic problem.  On Triamterene/HCTZ 37.5/25mg  daily and propranolol 40mg  BID w/ adequate control.  No CP, SOB, HAs, visual changes, edema.  Hyperlipidemia- chronic problem, on Atorvastatin 20mg  daily w/ hx of good control.  No abd pain, N/V.  Continues to exercise regularly.  Depression- chronic problem, following w/ Dr Rushie Chestnut.  Therapist returns from maternity leave in a couple weeks and pt is looking forward to resuming care.   Review of Systems For ROS see HPI     Objective:   Physical Exam  Constitutional: She is oriented to person, place, and time. She appears well-developed and well-nourished. No distress.  HENT:  Head: Normocephalic and atraumatic.  Eyes: Conjunctivae and EOM are normal. Pupils are equal, round, and reactive to light.  Neck: Normal range of motion. Neck supple. No thyromegaly present.  Cardiovascular: Normal rate, regular rhythm, normal heart sounds and intact distal pulses.  No murmur heard. Pulmonary/Chest: Effort normal and breath sounds normal. No respiratory distress.  Abdominal: Soft. She exhibits no distension. There is no tenderness.  Musculoskeletal: She exhibits no edema.  Lymphadenopathy:    She has no cervical adenopathy.  Neurological: She is alert and oriented to person, place, and time.  Skin: Skin is warm and dry.  Psychiatric: She has a normal mood and affect. Her behavior is normal.  Vitals reviewed.         Assessment & Plan:

## 2017-06-08 NOTE — Assessment & Plan Note (Signed)
Chronic problem.  Tolerating statin w/o difficulty.  Check labs.  Adjust meds prn  

## 2017-06-08 NOTE — Progress Notes (Addendum)
Subjective:   Theresa Martin is a 62 y.o. female who presents for Medicare Annual (Subsequent) preventive examination.  Review of Systems:  No ROS.  Medicare Wellness Visit. Additional risk factors are reflected in the social history.  Cardiac Risk Factors include: dyslipidemia;hypertension;family history of premature cardiovascular disease   Sleep patterns: Sleeps 7-10 hours.  Home Safety/Smoke Alarms: Feels safe in home. Smoke alarms in place.  Living environment; residence and Firearm Safety: Lives alone in 1 story home with 10 steps, uses rail.  Seat Belt Safety/Bike Helmet: Wears seat belt.   Female:   [id number], followed by GYN       Mammo-01/26/2017,  BI-RADS CATEGORY  1: Negative      Dexa scan-06/19/2015, Osteopenia. Ordered today. MCHP    CCS-colonoscopy 01/21/2010, normal. Recall 10 years     Objective:     Vitals: BP 130/62 (BP Location: Left Arm, Patient Position: Sitting, Cuff Size: Normal)   Pulse (!) 46   Temp 97.8 F (36.6 C) (Temporal)   Resp 16   Ht 5' 1" (1.549 m)   Wt 114 lb 12.8 oz (52.1 kg)   SpO2 98%   BMI 21.69 kg/m   Body mass index is 21.69 kg/m.  Advanced Directives 06/08/2017 01/18/2017 12/16/2016 06/03/2016 08/19/2015  Does Patient Have a Medical Advance Directive? _0   Would patient like information on creating a medical advance directive? No - Patient declined - No - Patient declined Yes (MAU/Ambulatory/Procedural Areas - Information given) No - patient declined information    Tobacco Social History   Tobacco Use  Smoking Status Never Smoker  Smokeless Tobacco Never Used     Counseling given: Not Answered    Past Medical History:  Diagnosis Date  . Allergy   . Anxiety   . Depression   . Hyperlipidemia   . Hypertension   . Kidney stones   . Persistent headaches   . Pneumonia   . Shingles outbreak 02/2014   Past Surgical History:  Procedure Laterality Date  . CARDIOVASCULAR STRESS TEST  02/2009   treadmill  stress test: Low risk  . [place]  . EXPLORATORY LAPAROTOMY     laproscopy for infertility  . EYE SURGERY Bilateral    laser-correct opening b/tn cornea and iris  . MOLE REMOVAL  2017   x 2 moles   Family History  Problem Relation Age of Onset  . Hypertension Mother   . Coronary artery disease Mother   . Heart failure Mother   . Hypertension Father   . Liver disease Sister        liver failure  . Arrhythmia Brother   . Hypertension Brother   . Cancer Neg Hx        colon   . Breast cancer Neg Hx    Social History   Socioeconomic History  . Marital status: Legally Separated    Spouse name: None  . Number of children: 2  . Years of education: None  . Highest education level: None  Social Needs  . Financial resource strain: None  . Food insecurity - worry: None  . Food insecurity - inability: None  . Transportation needs - medical: None  . Transportation needs - non-medical: None  Occupational History  . Occupation: Control and instrumentation engineer  Tobacco Use  . Smoking status: Never Smoker  . Smokeless tobacco: Never Used  Substance and Sexual Activity  . Alcohol use: No    Alcohol/week: 0.0 oz  Comment: rare  . Drug use: No  . Sexual activity: None  Other Topics Concern  . None  Social History Narrative   Regular exercise- yes, daily running, elliptical    Diet: fruits and veggies, water    No caffeine    Outpatient Encounter Medications as of 06/08/2017  Medication Sig  . albuterol (PROVENTIL HFA;VENTOLIN HFA) 108 (90 Base) MCG/ACT inhaler Inhale 2 puffs into the lungs every 6 (six) hours as needed for wheezing or shortness of breath.  Marland Kitchen atorvastatin (LIPITOR) 20 MG tablet TAKE 1 TABLET (20 MG TOTAL) BY MOUTH DAILY.  . Calcium-Vitamin D-Vitamin K (CALCIUM + D + K PO) Take 2 each by mouth daily. Calcium 560m- Vitamin D 1000iu- K 433m . clonazePAM (KLONOPIN) 0.5 MG tablet Take 0.25 mg by mouth at bedtime as needed.   . Marland Kitchenscitalopram (LEXAPRO) 10 MG  tablet Take 10 mg by mouth daily.   . Fluocinolone-Emollient (FLUOCINOLONE CREAM & EMOLLIENT EX) Apply topically.  . fluvoxaMINE (LUVOX) 100 MG tablet TAKE A 1/2 TABLET EVERY DAY FOR A WEEK, THEN TAKE 1 TABLET EVERY DAY.  . Marland Kitchenlucosamine-Chondroitin (MOVE FREE PO) Take 1 tablet by mouth daily.  . Marland Kitchenbuprofen (ADVIL,MOTRIN) 200 MG tablet Take 200 mg by mouth every 6 (six) hours as needed.  . Marland Kitchenetoconazole-Hydrocortisone 2 & 1 % KIT Apply topically as needed. Cracks on mouth  . lamoTRIgine (LAMICTAL) 100 MG tablet TAKE 1008mn the morning and 200 mg by mouth before bed.  . Melatonin 3 MG CAPS Take 1 capsule by mouth at bedtime.  . meloxicam (MOBIC) 15 MG tablet TAKE 1 TABLET BY MOUTH EVERY DAY  . Naproxen Sodium (ALEVE) 220 MG CAPS As needed for headache  . propranolol (INDERAL) 40 MG tablet Take 1 tablet (40 mg total) by mouth 2 (two) times daily.  . QUEtiapine (SEROQUEL XR) 300 MG 24 hr tablet Take 300 mg by mouth daily.   . traZODone (DESYREL) 50 MG tablet Take 50 mg by mouth at bedtime as needed for sleep.   . tMarland Kitcheniamterene-hydrochlorothiazide (MAXZIDE-25) 37.5-25 MG tablet TAKE 1 TABLET BY MOUTH DAILY.  . ZMarland Kitchenster Vaccine Adjuvanted ([facility]jection Inject 0.5 mLs into the muscle once for 1 dose.   No facility-administered encounter medications on file as of 06/08/2017.     Activities of Daily Living In your present state of health, do you have any difficulty performing the following activities: 06/08/2017  Hearing? N  Vision? N  Difficulty concentrating or making decisions? N  Walking or climbing stairs? N  Dressing or bathing? N  Doing errands, shopping? N  Preparing Food and eating ? N  Using the Toilet? N  In the past six months, have you accidently leaked urine? N  Do you have problems with loss of bowel control? N  Managing your Medications? N  Managing your Finances? N  Housekeeping or managing your Housekeeping? N  Some recent data might be hidden    Patient Care  Team: TabMidge MiniumD as PCP - General (Family Medicine) GutDalia HeadingNM as Midwife (Certified Nurse Midwife) ByrBary CastillaefForest GleasonD (General Surgery) CheUlis RiasD (Psychiatry) PenPenni BombardD as Consulting Physician (Neurology) PatSable FeilD as Consulting Physician (Gastroenterology) KatRushie Chestnutsychiatry) SieMickle [facility]beMck[facility], [facility] ([facility]) RigGerda DissO as Consulting Physician (Family Medicine)    Assessment:   This is a routine wellness examination for RebTawnieExercise Activities and Dietary recommendations Current Exercise Habits: Structured exercise class, Type  of exercise: Other - see comments(spin class), Time (Minutes): 60, Frequency (Times/Week): 4, Weekly Exercise (Minutes/Week): 240, Exercise limited by: None identified   Diet (meal preparation, eat out, water intake, caffeinated beverages, dairy products, fruits and vegetables): Drinks water.   Eats heart healthy 3 meals/day.   Goals    . Patient Stated     Maintain current health by staying active and eating well.        Fall Risk Fall Risk  06/08/2017 08/24/2016 06/03/2016 06/03/2016 02/23/2016  Falls in the past year? _0      Depression Screen PHQ 2/9 Scores 06/08/2017 01/19/2017 06/03/2016 06/03/2016  PHQ - 2 Score 1 0 0 0  PHQ- 9 Score 4 0 - 0     Cognitive Function MMSE - Mini Mental State Exam 06/03/2016  Orientation to time 5  Orientation to Place 5  Registration 3  Attention/ Calculation 5  Recall 3  Language- name 2 objects 2  Language- repeat 1  Language- follow 3 step command 3  Language- read & follow direction 1  Write a sentence 1  Copy design 1  Total score 30       Ad8 score reviewed for issues:  Issues making decisions: no  Less interest in hobbies / activities: no  Repeats questions, stories (family complaining): no  Trouble using ordinary gadgets (microwave, computer,  phone): no  Forgets the month or year: no  Mismanaging finances: no  Remembering appts: no  Daily problems with thinking and/or memory: no Ad8 score is=0     Immunization History  Administered Date(s) Administered  . Influenza,inj,Quad PF,6+ Mos 01/19/2017  . Tdap 07/27/2012  . Zoster 09/26/2014     Screening Tests Health Maintenance  Topic Date Due  . HIV Screening  01/01/1970  . PAP SMEAR  09/26/2017  . MAMMOGRAM  01/27/2019  . COLONOSCOPY  01/22/2020  . TETANUS/TDAP  07/28/2022  . INFLUENZA VACCINE  Completed  . Hepatitis C Screening  Completed       Plan:    Shingles vaccine at pharmacy.   Schedule bone scan.   Bring a copy of your living will and/or healthcare power of attorney to your next office visit.  Continue doing brain stimulating activities (puzzles, reading, adult coloring books, staying active) to keep memory sharp.   I have personally reviewed and noted the following in the patient's chart:   . Medical and social history . Use of alcohol, tobacco or illicit drugs  . Current medications and supplements . Functional ability and status . Nutritional status . Physical activity . Advanced directives . List of other physicians . Hospitalizations, surgeries, and ER visits in previous 12 months . Vitals . Screenings to include cognitive, depression, and falls . Referrals and appointments  In addition, I have reviewed and discussed with patient certain preventive protocols, quality metrics, and best practice recommendations. A written personalized care plan for preventive services as well as general preventive health recommendations were provided to patient.     Gerilyn Nestle, RN  06/08/2017  Reviewed documentation provided by RN and agree w/ above.  Annye Asa, MD

## 2017-06-09 ENCOUNTER — Other Ambulatory Visit: Payer: Self-pay | Admitting: Family Medicine

## 2017-06-09 DIAGNOSIS — R7989 Other specified abnormal findings of blood chemistry: Secondary | ICD-10-CM

## 2017-06-09 DIAGNOSIS — R945 Abnormal results of liver function studies: Principal | ICD-10-CM

## 2017-06-09 LAB — BASIC METABOLIC PANEL
BUN: 14 mg/dL (ref 7–25)
CALCIUM: 9.7 mg/dL (ref 8.6–10.4)
CO2: 30 mmol/L (ref 20–32)
Chloride: 102 mmol/L (ref 98–110)
Creat: 0.9 mg/dL (ref 0.50–0.99)
GLUCOSE: 89 mg/dL (ref 65–99)
Potassium: 4.5 mmol/L (ref 3.5–5.3)
SODIUM: 139 mmol/L (ref 135–146)

## 2017-06-09 LAB — CBC WITH DIFFERENTIAL/PLATELET
BASOS ABS: 34 {cells}/uL (ref 0–200)
Basophils Relative: 0.4 %
EOS PCT: 1.4 %
Eosinophils Absolute: 118 cells/uL (ref 15–500)
HCT: 37.4 % (ref 35.0–45.0)
Hemoglobin: 12.9 g/dL (ref 11.7–15.5)
Lymphs Abs: 1890 cells/uL (ref 850–3900)
MCH: 30.6 pg (ref 27.0–33.0)
MCHC: 34.5 g/dL (ref 32.0–36.0)
MCV: 88.6 fL (ref 80.0–100.0)
MONOS PCT: 7.3 %
MPV: 11.1 fL (ref 7.5–12.5)
NEUTROS ABS: 5746 {cells}/uL (ref 1500–7800)
NEUTROS PCT: 68.4 %
Platelets: 263 10*3/uL (ref 140–400)
RBC: 4.22 10*6/uL (ref 3.80–5.10)
RDW: 11.5 % (ref 11.0–15.0)
Total Lymphocyte: 22.5 %
WBC mixed population: 613 cells/uL (ref 200–950)
WBC: 8.4 10*3/uL (ref 3.8–10.8)

## 2017-06-09 LAB — LIPID PANEL
CHOL/HDL RATIO: 3.1 (calc) (ref <5.0)
Cholesterol: 178 mg/dL (ref <200)
HDL: 57 mg/dL (ref >50)
LDL CHOLESTEROL (CALC): 106 mg/dL — AB
Non-HDL Cholesterol (Calc): 121 mg/dL (calc) (ref <130)
TRIGLYCERIDES: 64 mg/dL (ref <150)

## 2017-06-09 LAB — HEPATIC FUNCTION PANEL
AG Ratio: 1.7 (calc) (ref 1.0–2.5)
ALBUMIN MSPROF: 4.4 g/dL (ref 3.6–5.1)
ALT: 45 U/L — AB (ref 6–29)
AST: 38 U/L — AB (ref 10–35)
Alkaline phosphatase (APISO): 88 U/L (ref 33–130)
BILIRUBIN DIRECT: 0.1 mg/dL (ref 0.0–0.2)
BILIRUBIN TOTAL: 0.4 mg/dL (ref 0.2–1.2)
Globulin: 2.6 g/dL (calc) (ref 1.9–3.7)
Indirect Bilirubin: 0.3 mg/dL (calc) (ref 0.2–1.2)
Total Protein: 7 g/dL (ref 6.1–8.1)

## 2017-06-09 LAB — TSH: TSH: 1.08 m[IU]/L (ref 0.40–4.50)

## 2017-06-15 DIAGNOSIS — F411 Generalized anxiety disorder: Secondary | ICD-10-CM | POA: Diagnosis not present

## 2017-06-15 DIAGNOSIS — F3181 Bipolar II disorder: Secondary | ICD-10-CM | POA: Diagnosis not present

## 2017-06-20 ENCOUNTER — Ambulatory Visit (HOSPITAL_BASED_OUTPATIENT_CLINIC_OR_DEPARTMENT_OTHER)
Admission: RE | Admit: 2017-06-20 | Discharge: 2017-06-20 | Disposition: A | Discharge status: Home / Self Care | Payer: Medicare Other | Source: Ambulatory Visit | Attending: Family Medicine | Admitting: Family Medicine

## 2017-06-20 DIAGNOSIS — M8580 Other specified disorders of bone density and structure, unspecified site: Secondary | ICD-10-CM | POA: Diagnosis not present

## 2017-06-20 DIAGNOSIS — E2839 Other primary ovarian failure: Secondary | ICD-10-CM | POA: Diagnosis not present

## 2017-06-20 DIAGNOSIS — M85852 Other specified disorders of bone density and structure, left thigh: Secondary | ICD-10-CM | POA: Insufficient documentation

## 2017-06-20 DIAGNOSIS — Z780 Asymptomatic menopausal state: Secondary | ICD-10-CM | POA: Diagnosis not present

## 2017-06-20 DIAGNOSIS — Z13820 Encounter for screening for osteoporosis: Secondary | ICD-10-CM | POA: Diagnosis not present

## 2017-06-23 ENCOUNTER — Other Ambulatory Visit (INDEPENDENT_AMBULATORY_CARE_PROVIDER_SITE_OTHER): Payer: Medicare Other

## 2017-06-23 DIAGNOSIS — R945 Abnormal results of liver function studies: Secondary | ICD-10-CM

## 2017-06-23 DIAGNOSIS — R7989 Other specified abnormal findings of blood chemistry: Secondary | ICD-10-CM

## 2017-06-23 LAB — HEPATIC FUNCTION PANEL
ALT: 32 U/L (ref 0–35)
AST: 30 U/L (ref 0–37)
Albumin: 4.2 g/dL (ref 3.5–5.2)
Alkaline Phosphatase: 76 U/L (ref 39–117)
BILIRUBIN DIRECT: 0.1 mg/dL (ref 0.0–0.3)
BILIRUBIN TOTAL: 0.4 mg/dL (ref 0.2–1.2)
Total Protein: 7.1 g/dL (ref 6.0–8.3)

## 2017-06-27 ENCOUNTER — Encounter: Payer: Self-pay | Admitting: General Practice

## 2017-06-29 DIAGNOSIS — F3181 Bipolar II disorder: Secondary | ICD-10-CM | POA: Diagnosis not present

## 2017-06-29 DIAGNOSIS — F411 Generalized anxiety disorder: Secondary | ICD-10-CM | POA: Diagnosis not present

## 2017-07-07 ENCOUNTER — Telehealth: Payer: Self-pay | Admitting: General Practice

## 2017-07-07 NOTE — Telephone Encounter (Signed)
Labs faxed

## 2017-07-07 NOTE — Telephone Encounter (Signed)
Ok to send labs.

## 2017-07-07 NOTE — Telephone Encounter (Signed)
Ok to send lab results to Psych?   Copied from [place]. Topic: Quick Communication - See Telephone Encounter >> Jul 07, 2017 11:21 AM Antonieta Iba C wrote: CRM for notification. See Telephone encounter for: 07/07/17.  Pt called in to request that her most 2 recent  lab work (March and April) be faxed to Rushie Chestnut -Psychiatrist - [phone]

## 2017-07-13 ENCOUNTER — Ambulatory Visit: Payer: Medicare Other | Admitting: Sports Medicine

## 2017-07-14 ENCOUNTER — Ambulatory Visit: Payer: Medicare Other | Admitting: Sports Medicine

## 2017-07-20 DIAGNOSIS — F3181 Bipolar II disorder: Secondary | ICD-10-CM | POA: Diagnosis not present

## 2017-07-20 DIAGNOSIS — F411 Generalized anxiety disorder: Secondary | ICD-10-CM | POA: Diagnosis not present

## 2017-07-21 ENCOUNTER — Ambulatory Visit: Payer: Medicare Other | Admitting: Sports Medicine

## 2017-07-26 ENCOUNTER — Ambulatory Visit: Payer: Medicare Other | Admitting: Sports Medicine

## 2017-07-26 DIAGNOSIS — Z0289 Encounter for other administrative examinations: Secondary | ICD-10-CM

## 2017-07-27 ENCOUNTER — Encounter: Payer: Self-pay | Admitting: Sports Medicine

## 2017-07-27 ENCOUNTER — Ambulatory Visit (INDEPENDENT_AMBULATORY_CARE_PROVIDER_SITE_OTHER): Payer: Medicare Other | Admitting: Sports Medicine

## 2017-07-27 VITALS — BP 112/60 | HR 58 | Ht 61.0 in | Wt 115.2 lb

## 2017-07-27 DIAGNOSIS — M9902 Segmental and somatic dysfunction of thoracic region: Secondary | ICD-10-CM

## 2017-07-27 DIAGNOSIS — M25511 Pain in right shoulder: Secondary | ICD-10-CM | POA: Diagnosis not present

## 2017-07-27 DIAGNOSIS — M9905 Segmental and somatic dysfunction of pelvic region: Secondary | ICD-10-CM

## 2017-07-27 DIAGNOSIS — M9903 Segmental and somatic dysfunction of lumbar region: Secondary | ICD-10-CM

## 2017-07-27 DIAGNOSIS — M9901 Segmental and somatic dysfunction of cervical region: Secondary | ICD-10-CM

## 2017-07-27 DIAGNOSIS — M9904 Segmental and somatic dysfunction of sacral region: Secondary | ICD-10-CM | POA: Diagnosis not present

## 2017-07-27 DIAGNOSIS — G8929 Other chronic pain: Secondary | ICD-10-CM

## 2017-07-27 DIAGNOSIS — M9908 Segmental and somatic dysfunction of rib cage: Secondary | ICD-10-CM

## 2017-07-27 NOTE — Progress Notes (Signed)
PROCEDURE NOTE : OSTEOPATHIC MANIPULATION The decision today to treat with Osteopathic Manipulative Therapy (OMT) was based on physical exam findings. Verbal consent was obtained following a discussion with the patient regarding the of risks, benefits and potential side effects, including an acute pain flare,post manipulation soreness and need for repeat treatments.     NONE  Manipulation was performed as below: Regions treated: Cervical spine, Ribs, Thoracic spine, Lumbar spine, Pelvis and Sacrum OMT Techniques Used: HVLA, muscle energy and myofascial release  The patient tolerated the treatment well and reported Improved symptoms following treatment today. Patient was given medications, exercises, stretches and lifestyle modifications per AVS and verbally.   OSTEOPATHIC/STRUCTURAL EXAM:   C2 through C4 FRS left C5 FRS right T2 extended side bent right T4 through T6 rotated left L3 FRS right Left on left sacral torsion Right anterior innominate

## 2017-07-27 NOTE — Progress Notes (Signed)
Theresa Martin. Theresa Martin, Theresa Martin - 62 y.o. female MRN [id number]  Date of birth: May 16, 1955  Visit Date: 07/27/2017  PCP: [name], MD   Referred by: [name], MD  Scribe for today's visit: Wendy Poet, LAT, ATC     SUBJECTIVE:  Theresa Martin Date "Theresa Martin" is here for Follow-up (R shoulder pain) .   03/30/17: Compared to the last office visit on 02/25/17, her previously described R shoulder symptoms are improving w/ much less pain noted after her last visit. Current symptoms are mild & are nonradiating and located mainly to her R scapular region. She has been taking IBU prn.  She has not gone back to PT since her last visit w/ Dr. Paulla Fore in late November 2018.  06/01/17: Compared to the last office visit, her previously described symptoms are improving. Still has some pain when lifting heavy objects. She has started lifting light weights at the gym to strengthen her shoulder.  Current symptoms are mild & are nonradiating She has been taking Advil or Meloxicam prn for the pain with some relief. She feels that she did benefit substantially from OMT at last visit.   07/27/17: Compared to the last office visit on 06/01/17, her previously described R shoulder pain symptoms show no change.  She states that, in general, her R shoulder is improving but notes that it flared up after taking care of her small granddaughter for the past few days. Current symptoms are moderate & are nonradiating She has been taking Advil or Meloxicam prn for pain.  She had OMT at her last visit and feels that this treatment has been helping w/ her symptoms.  ROS Denies night time disturbances. Denies fevers, chills, or night sweats. Denies unexplained weight loss. Denies personal history of cancer. Denies changes in bowel or bladder habits. Denies recent unreported falls. Denies new or worsening dyspnea  or wheezing. Reports headaches or dizziness.  Denies numbness, tingling or weakness  In the extremities.  Denies dizziness or presyncopal episodes Denies lower extremity edema    HISTORY & PERTINENT PRIOR DATA:  Prior History reviewed and updated per electronic medical record.  Significant/pertinent history, findings, studies include:  reports that she has never smoked. She has never used smokeless tobacco. No results for input(s): HGBA1C, LABURIC, CREATINE in the last 8760 hours. Pre-Visit Info 05/30/2015 9:51 AM  Medication: Reviewed & UTD with the patient.  Preferred Pharmacy and which med where: [id number] - [address], [employer]   Allergies verified: NKA  Immunization Status: Flu vaccine-- Declines Tdap-- 07/27/12 PNA-- NA Shingles-- 09/26/14  A/P:   Changes to Theresa Martin, PSH or Personal Hx: UTD Pap-- 09/27/14 w/ Dr. Ammie Dalton at [facility]; normal; patient reported MMG-- 09/27/14 at [facility]; normal; pt. reported Bone Density-- per patient, it has not been completed. CCS-- 01/21/10 w/ Dr. Verl Blalock at [facility]; no polyps or cancers; otherwise normal; follow-up in 10 years.  Care Teams Updated:  Dr. Ammie Dalton - Gynecology  Dr. Andrey Spearman - Neurology  ED/Hospital/Urgent Care Visits: Per the patient, no recent visits to the ED/Hospital or Urgent Care.  To Discuss with Provider: No concerns at the time of call. No problems updated.  OBJECTIVE:  VS:  HT:5\' 1"  (154.9 cm)   WT:115 lb 3.2 oz (52.3 kg)  BMI:21.78    BP:112/60  HR:(Abnormal) 58bpm  TEMP: ( )  RESP:95 %  PHYSICAL EXAM: Constitutional: WDWN, Non-toxic appearing. Psychiatric: Alert & appropriately interactive.  Not depressed or anxious appearing. Respiratory: No increased work of breathing.  Trachea Midline Eyes: Pupils are equal.  EOM intact without nystagmus.  No scleral icterus  Vascular Exam: warm to touch no edema  upper and lower extremity neuro  exam: unremarkable normal strength normal sensation normal reflexes  MSK Exam: Right shoulder is overall well aligned with only minimal anterior carriage.  She has full overhead range of motion.  Empty can testing, but O'Brien's testing, speeds testing and Yergason's are all within normal limits.  Minimal pain with axial load and circumduction.  She has scapular dyskinesis.  Upper extremity motion is otherwise normal.  Cervical sidebending and rotation is slightly limited but no pain Spurling's compression test negative compression test   ASSESSMENT & PLAN:   1. Chronic right shoulder pain   2. Somatic dysfunction of cervical region   3. Somatic dysfunction of thoracic region   4. Somatic dysfunction of lumbar region   5. Somatic dysfunction of rib cage region   6. Somatic dysfunction of pelvis region   7. Somatic dysfunction of sacral region     PLAN: Discussed the foundation of treatment for this condition is physical therapy and/or daily (5-6 days/week) therapeutic exercises, focusing on core strengthening, coordination, neuromuscular control/reeducation.  Therapeutic exercises prescribed per procedure note.  Plan the osteopathic manipulation was performed today based on physical exam findings.  Please see procedure note for further information including Osteopathic Exam findings  Follow-up: Return in about 8 weeks (around 09/21/2017).      Please see additional documentation for Objective, Assessment and Plan sections. Pertinent additional documentation may be included in corresponding procedure notes, imaging studies, problem based documentation and patient instructions. Please see these sections of the encounter for additional information regarding this visit.  CMA/ATC served as Education administrator during this visit. History, Physical, and Plan performed by medical provider. Documentation and orders reviewed and attested to.      Gerda Diss, [facility] Physician

## 2017-07-27 NOTE — Patient Instructions (Signed)
Also check out "Foundation Training" which is a program developed by Dr. Eric Goodman.   There are links to a couple of his YouTube Videos below and I would like to see performing one of his videos 5-6 days per week.    A good intro video is: "Independence from Pain 7-minute Video" - https://www.youtube.com/watch?[email or web address]   His more advanced video is: "Powerful Posture and Pain Relief: 12 minutes of Foundation Training" - https://youtu.be/[email or web address]  Do not try to attempt this entire video when first beginning.    Try breaking of each exercise that he goes into shorter segments.  Otherwise if they perform an exercise for 45 seconds, start with 15 seconds and rest and then resume when they begin the new activity.    If you work your way up to doing this 12 minute video, I expect you will see significant improvements in your pain.  If you enjoy his videos and would like to find out more you can look on his website: [email or web address].  He has a workout streaming option as well as a DVD set available for purchase.  Amazon has the best price for his DVDs.    

## 2017-08-06 ENCOUNTER — Encounter: Payer: Self-pay | Admitting: Sports Medicine

## 2017-08-07 ENCOUNTER — Other Ambulatory Visit: Payer: Self-pay | Admitting: Family Medicine

## 2017-08-17 DIAGNOSIS — F411 Generalized anxiety disorder: Secondary | ICD-10-CM | POA: Diagnosis not present

## 2017-08-17 DIAGNOSIS — F3181 Bipolar II disorder: Secondary | ICD-10-CM | POA: Diagnosis not present

## 2017-08-19 ENCOUNTER — Telehealth: Payer: Self-pay | Admitting: General Practice

## 2017-08-19 NOTE — Telephone Encounter (Signed)
Called pt she will check with insurance is covered she is ok to make a nurse visit for the vaccination at her convenience.    Copied from [place] [phone]. Topic: Quick Communication - See Telephone Encounter >> Aug 19, 2017  2:24 PM Antonieta Iba C wrote: CRM for notification. See Telephone encounter for: 08/19/17.  Pt has had her first shingles injection, she would like to know if her insurance will cover her second shingles injection. If so please assist pt with scheduling.    CB: [phone]

## 2017-08-20 ENCOUNTER — Other Ambulatory Visit: Payer: Self-pay | Admitting: Family Medicine

## 2017-08-23 ENCOUNTER — Encounter: Payer: Self-pay | Admitting: Sports Medicine

## 2017-08-24 ENCOUNTER — Telehealth: Payer: Self-pay | Admitting: Diagnostic Neuroimaging

## 2017-08-24 ENCOUNTER — Encounter: Payer: Self-pay | Admitting: Diagnostic Neuroimaging

## 2017-08-24 ENCOUNTER — Ambulatory Visit (INDEPENDENT_AMBULATORY_CARE_PROVIDER_SITE_OTHER): Payer: Medicare Other | Admitting: Diagnostic Neuroimaging

## 2017-08-24 VITALS — BP 119/59 | HR 50 | Ht 61.0 in | Wt 116.6 lb

## 2017-08-24 DIAGNOSIS — R200 Anesthesia of skin: Secondary | ICD-10-CM | POA: Diagnosis not present

## 2017-08-24 DIAGNOSIS — G43009 Migraine without aura, not intractable, without status migrainosus: Secondary | ICD-10-CM

## 2017-08-24 MED ORDER — GABAPENTIN 100 MG PO CAPS: 100.0000 mg | ORAL_CAPSULE | Freq: Two times a day (BID) | ORAL | 6 refills | Status: DC

## 2017-08-24 MED ORDER — PROPRANOLOL HCL 40 MG PO TABS: 40.0000 mg | ORAL_TABLET | Freq: Two times a day (BID) | ORAL | 4 refills | Status: DC

## 2017-08-24 NOTE — Telephone Encounter (Signed)
Medicare/BCBS Auth: [id number] (exp. 08/24/17 to 09/22/17) order sent to GI. They will reach out to the pt to schedule.

## 2017-08-24 NOTE — Progress Notes (Signed)
[facility]  PATIENT: Theresa Martin DOB: [birth year]  REFERRING CLINICIAN:  HISTORY FROM: patient REASON FOR VISIT: follow up   HISTORICAL  CHIEF COMPLAINT:  Chief Complaint  Patient presents with  . Follow-up  . Migraine     Keeps headache diary.   (avg 4-13/ month).     HISTORY OF PRESENT ILLNESS:   UPDATE (08/24/17, VRP): Since last visit, doing well. Tolerating propranolol. Avg 4-13 migraine per month. No alleviating or aggravating factors.   Separately, had right thoracic burning pain (no rash) in fall 2018, and was tx'd empirically with acyclovir. Then with right scalp burning pain last week; also with chronic left scalp sensitivity with migraines.  UPDATE 08/24/16: Since last visit, avg 2-3 HA per month. Tolerating meds. No new issues. Mood stable. Weight improved.   UPDATE 02/23/16: Since last visit has ~ 1-5 HA per month. Tolerating propranolol + OTC tylenol/aleve for HA mgmt.   UPDATE 08/19/15: Since last visit, avg 1-10 HA per month. Usually with stress. Some more wt gain noted (~10lbs). Also had a a laser eye procedure for angle closure glaucoma, now improved.  UPDATE 04/15/15: Since last visit, doing well. No more migraine since 02/28/15. Propranolol is helping prevent HA. Mood stable.  UPDATE 01/13/15: Since last visit, having more headaches, and more fluid retention. Now being tapered off gabapentin by psychiatry due to side effects.   UPDATE 09/02/14: Doing well. Avg 3-6 days HA per month. Some HA are mild, some are severe. Taking gabapentin 265m TID. Works out every day at [employer] Overall mood is better as well.  UPDATE 05/17/14: Since last visit, patient had some psychiatry issues, was managed by behavioral health, and her headaches significant improved. She was doing well for several years and did not follow-up in our clinic. Patient here with her father today for this visit. Patient's father notes that her headaches seem to be  significantly associated with her stress levels. In the last few months her stress levels have increased significantly. Her stress levels related to her ex-husband and her daughters, and some family issues. Patient having intermittent, almost daily left-sided headaches, with numbness and tingling in the left scalp, typically in the evening when she is home. During the daytime patient stays active, works out several times a week, and does better. Patient had been on gabapentin 100 mg at bedtime for several years. Her psychiatrist increased this to 2 and then 3 capsules at bedtime. When she did 3 capsules at bedtime, her headache type symptoms paradoxical he worsened. She then went back to taking one capsule at bedtime. Strangely, she reports that she was told she was able to take this medication (gabapentin) as needed as well and for the last few weeks has been taking one capsule at bedtime, followed by 1-2 capsules every hour, throughout the night, taking up to 10-14 capsules in a 12 hour period.   PRIOR HPI (11/04/08 - 06/03/09, VRP): 62year old right-handed female with history of high blood pressure, depression and anxiety presenting for evaluation of chronic headaches and abnormal MRI scan.  On October 26, 2008 the patient presented to the emergency room for right-sided chest pain and was diagnosed with pneumonia.   At this time her headaches worsened in severity.  Upon review of prior MRIs demonstrating microvascular gliosis, patient was referred to our neurology clinic for futher evaluation. Patient's headaches began in 2005 consisting of a "dull pressure like sensation" over the top of her head. Occasionally these are associated with mild  nausea without vomiting as well as intermittent left facial numbness.  Patient had dull nagging low level headaches on a daily basis with daily flareups involving a hot sensation over her scalp.  Sometimes this sensation starts as posterior neck pain that moves up the back of her  head. She was initially evaluated with MRI scans in 2005 and 2006 and started on topiramate 100 mg at bedtime.   In addition she was taking Advil and Tylenol over-the-counter almost a daily basis.   During flareups the patient would have to lay down in place ice packs over her eyes and the top of her head.   Patient's headaches are aggravated by lack of sleep or stress. No food triggers noted.  Her other physicians decided to taper her off of NSAIDs, tylenol and also her topiramate. Patient had MRI of the head and neck, found to have some nonspecific white matter lesions. Patient had lumbar puncture and additional blood testing, but multiple sclerosis was ruled out. Patient was treated with several occipital nerve blocks with good results.   REVIEW OF SYSTEMS: Full 14 system review of systems performed and negative except for: depression anxiety constipation blurred vision runny nose light sens drooling.  ALLERGIES: No Known Allergies  HOME MEDICATIONS: Outpatient Medications Prior to Visit  Medication Sig Dispense Refill  . albuterol (PROVENTIL HFA;VENTOLIN HFA) 108 (90 Base) MCG/ACT inhaler Inhale 2 puffs into the lungs every 6 (six) hours as needed for wheezing or shortness of breath. 1 Inhaler 2  . atorvastatin (LIPITOR) 20 MG tablet TAKE 1 TABLET BY MOUTH EVERY DAY 30 tablet 6  . Calcium-Vitamin D-Vitamin K (CALCIUM + D + K PO) Take 3 each by mouth daily. Calcium 522m- Vitamin D 1000iu- K 445m    . clonazePAM (KLONOPIN) 0.5 MG tablet Take 0.25 mg by mouth at bedtime as needed.   2  . escitalopram (LEXAPRO) 10 MG tablet Take 10 mg by mouth daily.     . Fluocinolone-Emollient (FLUOCINOLONE CREAM & EMOLLIENT EX) Apply topically.    . Glucosamine-Chondroitin (MOVE FREE PO) Take 1 tablet by mouth daily.    . Marland Kitchenbuprofen (ADVIL,MOTRIN) 200 MG tablet Take 200 mg by mouth every 6 (six) hours as needed.    . Marland Kitchenetoconazole-Hydrocortisone 2 & 1 % KIT Apply topically as needed. Cracks on mouth    .  lamoTRIgine (LAMICTAL) 100 MG tablet TAKE 10026mn the morning and 200 mg by mouth before bed.    . Melatonin 3 MG CAPS Take 1 capsule by mouth at bedtime.    . meloxicam (MOBIC) 15 MG tablet TAKE 1 TABLET BY MOUTH EVERY DAY 30 tablet 1  . Naproxen Sodium (ALEVE) 220 MG CAPS As needed for headache 60 each   . propranolol (INDERAL) 40 MG tablet Take 1 tablet (40 mg total) by mouth 2 (two) times daily. 180 tablet 4  . QUEtiapine (SEROQUEL XR) 300 MG 24 hr tablet Take 300 mg by mouth daily.     . traZODone (DESYREL) 50 MG tablet Take 50 mg by mouth at bedtime as needed for sleep.     . tMarland Kitcheniamterene-hydrochlorothiazide (MAXZIDE-25) 37.5-25 MG tablet TAKE 1 TABLET BY MOUTH EVERY DAY 30 tablet 6   No facility-administered medications prior to visit.     PAST MEDICAL HISTORY: Past Medical History:  Diagnosis Date  . Allergy   . Anxiety   . Depression   . Hyperlipidemia   . Hypertension   . Kidney stones   . Persistent headaches   .  Pneumonia   . Shingles outbreak 02/2014    PAST SURGICAL HISTORY: Past Surgical History:  Procedure Laterality Date  . CARDIOVASCULAR STRESS TEST  02/2009   treadmill stress test: Low risk  . [place]  . EXPLORATORY LAPAROTOMY     laproscopy for infertility  . EYE SURGERY Bilateral    laser-correct opening b/tn cornea and iris  . MOLE REMOVAL  2017   x 2 moles    FAMILY HISTORY: Family History  Problem Relation Age of Onset  . Hypertension Mother   . Coronary artery disease Mother   . Heart failure Mother   . Hypertension Father   . Liver disease Sister        liver failure  . Arrhythmia Brother   . Hypertension Brother   . Cancer Neg Hx        colon   . Breast cancer Neg Hx     SOCIAL HISTORY:  Social History   Socioeconomic History  . Marital status: Legally Separated    Spouse name: Not on file  . Number of children: 2  . Years of education: Not on file  . Highest education level: Not on file  Occupational  History  . Occupation: Control and instrumentation engineer  Social Needs  . Financial resource strain: Not on file  . Food insecurity:    Worry: Not on file    Inability: Not on file  . Transportation needs:    Medical: Not on file    Non-medical: Not on file  Tobacco Use  . Smoking status: Never Smoker  . Smokeless tobacco: Never Used  Substance and Sexual Activity  . Alcohol use: No    Alcohol/week: 0.0 oz    Comment: rare  . Drug use: No  . Sexual activity: Not on file  Lifestyle  . Physical activity:    Days per week: Not on file    Minutes per session: Not on file  . Stress: Not on file  Relationships  . Social connections:    Talks on phone: Not on file    Gets together: Not on file    Attends religious service: Not on file    Active member of club or organization: Not on file    Attends meetings of clubs or organizations: Not on file    Relationship status: Not on file  . Intimate partner violence:    Fear of current or ex partner: Not on file    Emotionally abused: Not on file    Physically abused: Not on file    Forced sexual activity: Not on file  Other Topics Concern  . Not on file  Social History Narrative   Regular exercise- yes, daily running, elliptical    Diet: fruits and veggies, water    No caffeine     PHYSICAL EXAM  Vitals:   08/24/17 1342  BP: (!) 119/59  Pulse: (!) 50  Weight: 116 lb 9.6 oz (52.9 kg)  Height: 5' 1"  (1.549 m)   Wt Readings from Last 3 Encounters:  08/24/17 116 lb 9.6 oz (52.9 kg)  07/27/17 115 lb 3.2 oz (52.3 kg)  06/08/17 114 lb 12.8 oz (52.1 kg)   Body mass index is 22.03 kg/m.  No exam data present  MMSE - Mini Mental State Exam 06/03/2016  Orientation to time 5  Orientation to Place 5  Registration 3  Attention/ Calculation 5  Recall 3  Language- name 2 objects 2  Language- repeat 1  Language- follow  3 step command 3  Language- read & follow direction 1  Write a sentence 1  Copy design 1  Total score 30    GENERAL  EXAM: Patient is in no distress; well developed, nourished and groomed; neck is supple  CARDIOVASCULAR: Regular rate and rhythm, no murmurs, no carotid bruits; BRADYCARDIA  NEUROLOGIC: MENTAL STATUS: awake, alert, language fluent, comprehension intact, naming intact, fund of knowledge appropriate CRANIAL NERVE: pupils equal and reactive to light, visual fields full to confrontation, extraocular muscles intact, no nystagmus, facial sensation and strength symmetric, hearing intact, palate elevates symmetrically, uvula midline, shoulder shrug symmetric, tongue midline. MOTOR: normal bulk and tone, full strength in the BUE, BLE SENSORY: normal and symmetric to light touch, temperature, vibration COORDINATION: finger-nose-finger, fine finger movements normal REFLEXES: deep tendon reflexes present and symmetric GAIT/STATION: narrow based gait; romberg is negative    DIAGNOSTIC DATA (LABS, IMAGING, TESTING) - I reviewed patient records, labs, notes, testing and imaging myself where available.  Lab Results  Component Value Date   WBC 8.4 06/08/2017   HGB 12.9 06/08/2017   HCT 37.4 06/08/2017   MCV 88.6 06/08/2017   PLT 263 06/08/2017      Component Value Date/Time   NA 139 06/08/2017 1534   K 4.5 06/08/2017 1534   CL 102 06/08/2017 1534   CO2 30 06/08/2017 1534   GLUCOSE 89 06/08/2017 1534   BUN 14 06/08/2017 1534   CREATININE 0.90 06/08/2017 1534   CALCIUM 9.7 06/08/2017 1534   PROT 7.1 06/23/2017 1428   ALBUMIN 4.2 06/23/2017 1428   AST 30 06/23/2017 1428   ALT 32 06/23/2017 1428   ALKPHOS 76 06/23/2017 1428   BILITOT 0.4 06/23/2017 1428   GFRNONAA 80 (L) 05/07/2013 1525   GFRAA >90 05/07/2013 1525   Lab Results  Component Value Date   CHOL 178 06/08/2017   HDL 57 06/08/2017   LDLCALC 106 (H) 06/08/2017   TRIG 64 06/08/2017   CHOLHDL 3.1 06/08/2017   Lab Results  Component Value Date   HGBA1C 5.7 10/18/2011   Lab Results  Component Value Date   [id number] 938  01/16/2010   Lab Results  Component Value Date   TSH 1.08 06/08/2017    11/13/08 LP - opening pressure 7cm H2O; WBC 1, RBC 0, glucose 62, protein 26, OCB (2 is CSF, not seen in serum), IgG index 0.5 (normal), lyme PCR neg, EBV PCR neg  11/13/08 VEP - normal  11/08/08 MRI cervical - normal  11/08/08 MRI brain (with and without contrast) - multiple supratentorial, periventricular and juxtacortical white matter lesions which may represent chronic demyelinating plaques or perivascular gliosis.  No abnormal enhancement on postcontrast views.     ASSESSMENT AND PLAN  62 y.o. year old female here with mixed tension and migraine headaches, left occipital neuralgia, and longer standing significant depression/anxiety. Some headaches have occipital neuralgia type features. Symptoms seem to be worse with stress and external factors.   Tried gabapentin with some benefit, but then was having more HA so switched to propranolol.     Dx:  Numbness - Plan: MR BRAIN W WO CONTRAST  Migraine without aura and without status migrainosus, not intractable    PLAN:  MIGRAINE WITHOUT AURA - continue propranolol 58m twice a day  - continue aleve and tylenol as needed migraine - continue exercise, activity, nutrition strategies  MIGRATORY PARESTHESIAS (? Shingles vs other cause; follow up prior MRI brain scan white matter gliosis) - repeat MRI brain w/wo - gabapentin 1055mdaily -  twice a day for burning pain  Orders Placed This Encounter  Procedures  . MR BRAIN W WO CONTRAST   Meds ordered this encounter  Medications  . propranolol (INDERAL) 40 MG tablet    Sig: Take 1 tablet (40 mg total) by mouth 2 (two) times daily.    Dispense:  180 tablet    Refill:  4  . gabapentin (NEURONTIN) 100 MG capsule    Sig: Take 1 capsule (100 mg total) by mouth 2 (two) times daily.    Dispense:  60 capsule    Refill:  6   Return in about 5 months (around 01/24/2018).     Penni Bombard, MD  0/99/2780, 0:44 PM Certified in Neurology, Neurophysiology and Neuroimaging  [facility] [address], [place], [place] [address] [phone]

## 2017-08-24 NOTE — Patient Instructions (Signed)
  MIGRAINE WITHOUT AURA - continue propranolol 40mg  twice a day  - continue aleve and tylenol as needed migraine - continue exercise, activity, nutrition strategies  NUMBNESS (? Shingles vs other cause) - repeat MRI brain w/wo - gabapentin 100mg  daily up to twice a day for burning pain

## 2017-08-28 ENCOUNTER — Ambulatory Visit
Admission: RE | Admit: 2017-08-28 | Discharge: 2017-08-28 | Disposition: A | Discharge status: Home / Self Care | Payer: Medicare Other | Source: Ambulatory Visit | Attending: Diagnostic Neuroimaging | Admitting: Diagnostic Neuroimaging

## 2017-08-28 DIAGNOSIS — R200 Anesthesia of skin: Secondary | ICD-10-CM | POA: Diagnosis not present

## 2017-08-28 DIAGNOSIS — R202 Paresthesia of skin: Secondary | ICD-10-CM | POA: Diagnosis not present

## 2017-08-28 MED ORDER — GADOBENATE DIMEGLUMINE 529 MG/ML IV SOLN
10.0000 mL | Freq: Once | INTRAVENOUS | Status: AC | PRN
  Administered 2017-08-28: 10 mL via INTRAVENOUS

## 2017-08-31 ENCOUNTER — Telehealth: Payer: Self-pay | Admitting: *Deleted

## 2017-08-31 NOTE — Telephone Encounter (Signed)
Spoke with patient and informed her that her MRI brain was an unremarkable study with no major findings.  Advised she continue with Dr Gladstone Lighter plan; reviewed plan from last office note. She verbalized understanding, appreciation of call.

## 2017-09-07 ENCOUNTER — Telehealth: Payer: Self-pay | Admitting: Diagnostic Neuroimaging

## 2017-09-07 ENCOUNTER — Telehealth: Payer: Self-pay | Admitting: *Deleted

## 2017-09-07 DIAGNOSIS — F3181 Bipolar II disorder: Secondary | ICD-10-CM | POA: Diagnosis not present

## 2017-09-07 DIAGNOSIS — F411 Generalized anxiety disorder: Secondary | ICD-10-CM | POA: Diagnosis not present

## 2017-09-07 NOTE — Telephone Encounter (Signed)
Reviewing in PCP absence. I could order this in PCP absence but I would recommend that the patient check with her insurance regarding coverage. I am not sure if Medicare will pay for this -- I know they will pay for screening colonoscopy or a cologuard screen.  If patient has any history of polyps or family history of colon cancer a colonoscopy would be the test of choice. Let me know what she says and I can work on things for her.

## 2017-09-07 NOTE — Telephone Encounter (Signed)
Patient calling to get directions for taking gabapentin (NEURONTIN) 100 MG capsule. Should she just take when she has a headache or just as needed?

## 2017-09-07 NOTE — Telephone Encounter (Signed)
Please advise 

## 2017-09-07 NOTE — Telephone Encounter (Signed)
Spoke with patient and reviewed Dr [facility] plan per last office note for migraine management and paresthesia management.  Advised she take Gabapentin twice daily as prescribed.  She asked whether to take Tylenol, Aleve or ibuprofen as needed for migraine. This RN suggested she may alternate to see which is more helpful. She is keeping journal of headaches, is doing well at this time. She verbalized understanding, appreciation.

## 2017-09-07 NOTE — Telephone Encounter (Signed)
Called and spoke with pt, she is going to call her insurance and find out if the virtual colonoscopy is covered and will call the office to let us know how to proceed.

## 2017-09-07 NOTE — Telephone Encounter (Signed)
Copied from [place] [phone]. Topic: Referral - Request >> Sep 07, 2017  9:37 AM Nils Flack wrote: Reason for CRM: pt would like to be referred for a virtual colonoscopy - at [facility] imaging  Cb is [phone]

## 2017-09-21 ENCOUNTER — Ambulatory Visit (INDEPENDENT_AMBULATORY_CARE_PROVIDER_SITE_OTHER): Payer: Medicare Other | Admitting: Sports Medicine

## 2017-09-21 ENCOUNTER — Encounter: Payer: Self-pay | Admitting: Sports Medicine

## 2017-09-21 VITALS — BP 116/80 | HR 50 | Ht 61.0 in | Wt 117.0 lb

## 2017-09-21 DIAGNOSIS — G8929 Other chronic pain: Secondary | ICD-10-CM | POA: Diagnosis not present

## 2017-09-21 DIAGNOSIS — M9902 Segmental and somatic dysfunction of thoracic region: Secondary | ICD-10-CM

## 2017-09-21 DIAGNOSIS — M25511 Pain in right shoulder: Secondary | ICD-10-CM

## 2017-09-21 DIAGNOSIS — M9908 Segmental and somatic dysfunction of rib cage: Secondary | ICD-10-CM | POA: Diagnosis not present

## 2017-09-21 DIAGNOSIS — M9901 Segmental and somatic dysfunction of cervical region: Secondary | ICD-10-CM | POA: Diagnosis not present

## 2017-09-21 DIAGNOSIS — M9907 Segmental and somatic dysfunction of upper extremity: Secondary | ICD-10-CM | POA: Diagnosis not present

## 2017-09-21 DIAGNOSIS — M9903 Segmental and somatic dysfunction of lumbar region: Secondary | ICD-10-CM

## 2017-09-21 NOTE — Progress Notes (Signed)
Theresa Martin. Theresa Martin, [place] at [facility] [phone]  Theresa Martin - 62 y.o. female MRN [id number]  Date of birth: 1955/07/24  Visit Date: 09/21/2017  PCP: [name], MD   Referred by: [name], MD  Scribe(s) for today's visit: Josepha Pigg, CMA  SUBJECTIVE:  Theresa Martin "Theresa Martin" is here for Follow-up (R shoulder pain)   03/30/17: Compared to the last office visit on 02/25/17, her previously described R shoulder symptoms are improving w/ much less pain noted after her last visit. Current symptoms are mild & are nonradiating and located mainly to her R scapular region. She has been taking IBU prn.  She has not gone back to PT since her last visit w/ Dr. Paulla Fore in late November 2018.  06/01/17: Compared to the last office visit, her previously described symptoms are improving. Still has some pain when lifting heavy objects. She has started lifting light weights at the gym to strengthen her shoulder.  Current symptoms are mild & are nonradiating She has been taking Advil or Meloxicam prn for the pain with some relief. She feels that she did benefit substantially from OMT at last visit.   07/27/17: Compared to the last office visit on 06/01/17, her previously described R shoulder pain symptoms show no change.  She states that, in general, her R shoulder is improving but notes that it flared up after taking care of her small granddaughter for the past few days. Current symptoms are moderate & are nonradiating She has been taking Advil or Meloxicam prn for pain.  She had OMT at her last visit and feels that this treatment has been helping w/ her symptoms.  09/21/2017: Compared to the last office visit, her previously described symptoms are worsening. She has increased pain when picking up her grandchild. She has pain whenever she tried to do anything strenuous with her R side.  Current symptoms are moderate &  are nonradiating. Pain imrpoves when she props her arm up as opposed to having it by her side.  She has been taking Advil or Meloxicam prn for pain with some relief. She received OMT in the past and tolerated well. She has been doing HEP and [employer]. She reports that she has been unable to access some of the exercises (probably goodman 12 min video) on her phone.    REVIEW OF SYSTEMS: Reports night time disturbances. Denies fevers, chills, or night sweats. Denies unexplained weight loss. Denies personal history of cancer. Denies changes in bowel or bladder habits. Denies recent unreported falls. Denies new or worsening dyspnea or wheezing. Reports headaches - had MRI a couple of weeks ago.  Reports numbness, tingling or weakness in both arms, this comes and goes - positional.  Denies dizziness or presyncopal episodes Denies lower extremity edema    HISTORY & PERTINENT PRIOR DATA:  Prior History reviewed and updated per electronic medical record.  Significant/pertinent history, findings, studies include:  reports that she has never smoked. She has never used smokeless tobacco. No results for input(s): HGBA1C, LABURIC, CREATINE in the last 8760 hours. Pre-Visit Info 05/30/2015 9:51 AM  Medication: Reviewed & UTD with the patient.  Preferred Pharmacy and which med where: [id number] - [address], [place] verified: [facility]  Immunization Status: Flu vaccine-- Declines Tdap-- 07/27/12 PNA-- NA Shingles-- 09/26/14  A/P:   Changes to [place], PSH or Personal Hx: UTD Pap-- 09/27/14 w/ Dr. Ammie Dalton  at [facility]; normal; patient reported MMG-- 09/27/14 [facility] normal; pt. reported Bone Density-- per patient, it has not been completed. CCS-- 01/21/10 w/ Dr. Verl Blalock at [facility]; no polyps or cancers; otherwise normal; follow-up in 10 years.  Care Teams Updated:  Dr. Ammie Dalton - Gynecology  Dr. Andrey Spearman -  Neurology  ED/Hospital/Urgent Care Visits: Per the patient, no recent visits to the ED/Hospital or Urgent Care.  To Discuss with Provider: No concerns at the time of call. No problems updated.  OBJECTIVE:  VS:  HT:5\' 1"  (154.9 cm)   WT:117 lb (53.1 kg)  BMI:22.12    BP:116/80  HR:(Abnormal) 50bpm  TEMP: ( )  RESP:98 %   PHYSICAL EXAM: Constitutional: WDWN, Non-toxic appearing. Psychiatric: Alert & appropriately interactive.  Not depressed or anxious appearing. Respiratory: No increased work of breathing.  Trachea Midline Eyes: Pupils are equal.  EOM intact without nystagmus.  No scleral icterus  Vascular Exam: warm to touch no edema  upper extremity neuro exam: unremarkable normal strength normal sensation  MSK Exam: Fully overhead ROM Limited sidebending and rotation Negative spurlings and Lehermits   ASSESSMENT & PLAN:   1. Chronic right shoulder pain   2. Somatic dysfunction of upper extremity   3. Somatic dysfunction of cervical region   4. Somatic dysfunction of thoracic region   5. Somatic dysfunction of rib cage region   6. Somatic dysfunction of lumbar region     PLAN: Osteopathic manipulation was performed today based on physical exam findings.  Please see procedure note for further information including Osteopathic Exam findings  Time spent in focus of the upper cervical region.  She had good improvements in her symptoms.  Links to [employer] provided today per Patient Instructions.  These exercises were developed by Minerva Ends, DC with a strong emphasis on core neuromuscular reducation and postural realignment through body-weight exercises.   Follow-up: Return in about 1 month (around 10/19/2017).      Please see additional documentation for Objective, Assessment and Plan sections. Pertinent additional documentation may be included in corresponding procedure notes, imaging studies, problem based documentation and patient  instructions. Please see these sections of the encounter for additional information regarding this visit.  CMA/ATC served as Education administrator during this visit. History, Physical, and Plan performed by medical provider. Documentation and orders reviewed and attested to.      Gerda Diss, [facility] Physician

## 2017-09-21 NOTE — Patient Instructions (Signed)

## 2017-09-21 NOTE — Progress Notes (Signed)
PROCEDURE NOTE : OSTEOPATHIC MANIPULATION The decision today to treat with Osteopathic Manipulative Therapy (OMT) was based on physical exam findings. Verbal consent was obtained following a discussion with the patient regarding the of risks, benefits and potential side effects, including an acute pain flare,post manipulation soreness and need for repeat treatments.     NONE  Manipulation was performed as below: Regions treated: Cervical spine, Ribs, Thoracic spine, Lumbar spine and Upper extremities OMT Techniques Used: HVLA, muscle energy and myofascial release  The patient tolerated the treatment well and reported Improved symptoms following treatment today. Patient was given medications, exercises, stretches and lifestyle modifications per AVS and verbally.   OSTEOPATHIC/STRUCTURAL EXAM:   C2 through C4 FRS left C6 FRS right T2 - T5 neutral side bent right, rotated left Ribs 6 posterior right T3 FRS left R internal rotated humerus

## 2017-09-28 DIAGNOSIS — F3181 Bipolar II disorder: Secondary | ICD-10-CM | POA: Diagnosis not present

## 2017-09-28 DIAGNOSIS — F411 Generalized anxiety disorder: Secondary | ICD-10-CM | POA: Diagnosis not present

## 2017-10-21 ENCOUNTER — Encounter: Payer: Self-pay | Admitting: Sports Medicine

## 2017-10-21 ENCOUNTER — Ambulatory Visit (INDEPENDENT_AMBULATORY_CARE_PROVIDER_SITE_OTHER): Payer: Medicare Other | Admitting: Sports Medicine

## 2017-10-21 VITALS — BP 110/62 | HR 54 | Ht 61.0 in | Wt 115.6 lb

## 2017-10-21 DIAGNOSIS — M9901 Segmental and somatic dysfunction of cervical region: Secondary | ICD-10-CM

## 2017-10-21 DIAGNOSIS — M9908 Segmental and somatic dysfunction of rib cage: Secondary | ICD-10-CM

## 2017-10-21 DIAGNOSIS — M9907 Segmental and somatic dysfunction of upper extremity: Secondary | ICD-10-CM | POA: Diagnosis not present

## 2017-10-21 DIAGNOSIS — G8929 Other chronic pain: Secondary | ICD-10-CM | POA: Diagnosis not present

## 2017-10-21 DIAGNOSIS — M9902 Segmental and somatic dysfunction of thoracic region: Secondary | ICD-10-CM

## 2017-10-21 DIAGNOSIS — M25511 Pain in right shoulder: Secondary | ICD-10-CM | POA: Diagnosis not present

## 2017-10-21 NOTE — Progress Notes (Signed)
PROCEDURE NOTE : OSTEOPATHIC MANIPULATION The decision today to treat with Osteopathic Manipulative Therapy (OMT) was based on physical exam findings. Verbal consent was obtained following a discussion with the patient regarding the of risks, benefits and potential side effects, including an acute pain flare,post manipulation soreness and need for repeat treatments.     NONE  Manipulation was performed as below: Regions treated: Cervical spine, Thoracic spine, Ribs and Upper extremities OMT Techniques Used: HVLA, muscle energy and myofascial release  The patient tolerated the treatment well and reported Improved symptoms following treatment today. Patient was given medications, exercises, stretches and lifestyle modifications per AVS and verbally.   OSTEOPATHIC/STRUCTURAL EXAM:   C2 through C4 FRS left C5 FRS right T2 extended side bent left T4 - T8 N RS left  Posterior rib 6 on the right Internally rotated right shoulder.  Pectoralis major muscle contraction

## 2017-10-21 NOTE — Progress Notes (Signed)
[name]. Maisen Schmit, [place] at [facility] [phone]  [name] - 62 y.o. female MRN [id number]  Date of birth: 1955-09-07  Visit Date: 10/21/2017  PCP: [name], MD   Referred by: [name], MD  Scribe(s) for today's visit: Josepha Pigg, CMA  SUBJECTIVE:  Theresa Martin "Theresa Martin" is here for Follow-up (R shoulder pain)   03/30/17: Compared to the last office visit on 02/25/17, her previously described R shoulder symptoms are improving w/ much less pain noted after her last visit. Current symptoms are mild & are nonradiating and located mainly to her R scapular region. She has been taking IBU prn.  She has not gone back to PT since her last visit w/ Dr. Paulla Fore in late November 2018.  06/01/17: Compared to the last office visit, her previously described symptoms are improving. Still has some pain when lifting heavy objects. She has started lifting light weights at the gym to strengthen her shoulder.  Current symptoms are mild & are nonradiating She has been taking Advil or Meloxicam prn for the pain with some relief. She feels that she did benefit substantially from OMT at last visit.   07/27/17: Compared to the last office visit on 06/01/17, her previously described R shoulder pain symptoms show no change.  She states that, in general, her R shoulder is improving but notes that it flared up after taking care of her small granddaughter for the past few days. Current symptoms are moderate & are nonradiating She has been taking Advil or Meloxicam prn for pain.  She had OMT at her last visit and feels that this treatment has been helping w/ her symptoms.  09/21/2017: Compared to the last office visit, her previously described symptoms are worsening. She has increased pain when picking up her grandchild. She has pain whenever she tried to do anything strenuous with her R side.  Current symptoms are moderate &  are nonradiating. Pain imrpoves when she props her arm up as opposed to having it by her side.  She has been taking Advil or Meloxicam prn for pain with some relief. She received OMT in the past and tolerated well. She has been doing HEP and [employer]. She reports that she has been unable to access some of the exercises (probably goodman 12 min video) on her phone.   10/21/2017: Compared to the last office visit, her previously described symptoms are worsening, she vacuumed this past Tuesday and that caused pain to flare up. She started having n/t in her arms and fingers again. She has been having trouble sleeping d/t pain, seems to happen after being more active. She is now having pain on the L side of her neck and into the L shoulder.  Current symptoms are moderate & are radiating to both arms.  She has been taking Advil or Meloxicam with some relief. She responded well to OMT at her last visit. She hasn't been doing HEP regularly.    REVIEW OF SYSTEMS: Reports night time disturbances. Denies fevers, chills, or night sweats. Denies unexplained weight loss. Denies personal history of cancer. Denies changes in bowel or bladder habits. Denies recent unreported falls. Denies new or worsening dyspnea or wheezing. Reports headaches or dizziness.  Reports numbness, tingling or weakness in B upper extremities.  Denies dizziness or presyncopal episodes Denies lower extremity edema    HISTORY:  Prior history reviewed and updated per electronic medical record.  Social History  Occupational History  . Occupation: Control and instrumentation engineer  Tobacco Use  . Smoking status: Never Smoker  . Smokeless tobacco: Never Used  Substance and Sexual Activity  . Alcohol use: No    Alcohol/week: 0.0 standard drinks    Comment: rare  . Drug use: No  . Sexual activity: Not on file   Social History   Social History Narrative   Regular exercise- yes, daily running, elliptical    Diet: fruits and  veggies, water    No caffeine     DATA OBTAINED & REVIEWED:  No results for input(s): HGBA1C, LABURIC, CREATINE in the last 8760 hours. .   OBJECTIVE:  VS:  HT:5\' 1"  (154.9 cm)   WT:115 lb 9.6 oz (52.4 kg)  BMI:21.85    BP:110/62  HR:(!) 54bpm  TEMP: ( )  RESP:97 %   PHYSICAL EXAM: CONSTITUTIONAL: Well-developed, Well-nourished and In no acute distress PSYCHIATRIC: Alert & appropriately interactive. and Not depressed or anxious appearing. RESPIRATORY: No increased work of breathing and Trachea Midline EYES: Pupils are equal., EOM intact without nystagmus. and No scleral icterus.  VASCULAR EXAM: Warm and well perfused NEURO: unremarkable  MSK Exam: Overhead range of motion of the shoulder significantly improved.  Normal empty can testing.  Normal Hawkins. Limited cervical flexion, sidebending and rotation but this improves with osteopathic manipulation.  Persistently tight anterior chamber this does improve as well with OMT.  Negative neural tension testing.  She is able to heel toe walk without difficulty.  ASSESSMENT   1. Chronic right shoulder pain   2. Somatic dysfunction of upper extremity   3. Somatic dysfunction of cervical region   4. Somatic dysfunction of thoracic region   5. Somatic dysfunction of rib cage region     PLAN:  Pertinent additional documentation may be included in corresponding procedure notes, imaging studies, problem based documentation and patient instructions.  Procedures:  . Osteopathic manipulation was performed today based on physical exam findings.  Please see procedure note for further information including Osteopathic Exam findings  Medications:  No orders of the defined types were placed in this encounter.  Discussion/Instructions: No problem-specific Assessment & Plan notes found for this encounter.  . Doing well with OMT and HEP. . Continue previously prescribed home exercise program.  . Discussed red flag symptoms that warrant  earlier emergent evaluation and patient voices understanding. . Activity modifications and the importance of avoiding exacerbating activities (limiting pain to no more than a 4 / 10 during or following activity) recommended and discussed.  Follow-up:  . Return in about 4 weeks (around 11/18/2017) for consideration of repeat Osteopathic Manipulation.  . If any lack of improvement consider: . further diagnostic evaluation with Plain film x-rays of the shoulder and cervical spine.     CMA/ATC served as Education administrator during this visit. History, Physical, and Plan performed by medical provider. Documentation and orders reviewed and attested to.      Gerda Diss, [facility] Physician

## 2017-11-21 ENCOUNTER — Ambulatory Visit (INDEPENDENT_AMBULATORY_CARE_PROVIDER_SITE_OTHER): Payer: Medicare Other | Admitting: Sports Medicine

## 2017-11-21 ENCOUNTER — Encounter: Payer: Self-pay | Admitting: Sports Medicine

## 2017-11-21 VITALS — BP 100/56 | HR 58 | Ht 61.0 in | Wt 114.8 lb

## 2017-11-21 DIAGNOSIS — M9901 Segmental and somatic dysfunction of cervical region: Secondary | ICD-10-CM

## 2017-11-21 DIAGNOSIS — M9908 Segmental and somatic dysfunction of rib cage: Secondary | ICD-10-CM | POA: Diagnosis not present

## 2017-11-21 DIAGNOSIS — M545 Low back pain, unspecified: Secondary | ICD-10-CM

## 2017-11-21 DIAGNOSIS — M9905 Segmental and somatic dysfunction of pelvic region: Secondary | ICD-10-CM | POA: Diagnosis not present

## 2017-11-21 DIAGNOSIS — M9902 Segmental and somatic dysfunction of thoracic region: Secondary | ICD-10-CM

## 2017-11-21 DIAGNOSIS — M9903 Segmental and somatic dysfunction of lumbar region: Secondary | ICD-10-CM

## 2017-11-21 DIAGNOSIS — M9904 Segmental and somatic dysfunction of sacral region: Secondary | ICD-10-CM

## 2017-11-21 DIAGNOSIS — G8929 Other chronic pain: Secondary | ICD-10-CM | POA: Diagnosis not present

## 2017-11-21 DIAGNOSIS — M25511 Pain in right shoulder: Secondary | ICD-10-CM

## 2017-11-21 NOTE — Patient Instructions (Signed)
Keep working on your exercise daily basis.

## 2017-11-21 NOTE — Progress Notes (Signed)
PROCEDURE NOTE : OSTEOPATHIC MANIPULATION The decision today to treat with Osteopathic Manipulative Therapy (OMT) was based on physical exam findings. Verbal consent was obtained following a discussion with the patient regarding the of risks, benefits and potential side effects, including an acute pain flare,post manipulation soreness and need for repeat treatments.     Contraindications to OMT: NONE  Manipulation was performed as below: Regions treated: Cervical spine, Thoracic spine, Ribs, Lumbar spine, Pelvis and Sacrum OMT Techniques Used: HVLA, muscle energy and myofascial release  The patient tolerated the treatment well and reported Improved symptoms following treatment today. Patient was given medications, exercises, stretches and lifestyle modifications per AVS and verbally.   OSTEOPATHIC/STRUCTURAL EXAM:   OA - rotated right T2 -6 Neutral, Rotated LEFT, Sidebent RIGHT T8 FRS right (Flexed, Rotated & Sidebent) Rib 7 Right  Posterior L4 FRS left (Flexed, Rotated & Sidebent) Right psoas spasm Right anterior innonimate L on L sacral torsion

## 2017-11-21 NOTE — Progress Notes (Signed)
Theresa Martin. Theresa Martin, [place] at [facility] [phone]  Theresa Martin - 62 y.o. female MRN [id number]  Date of birth: 1956-03-25  Visit Date: 11/21/2017  PCP: [name], MD   Referred by: [name], MD  Scribe(s) for today's visit: Wendy Poet, LAT, ATC  SUBJECTIVE:  Theresa Martin "Theresa Martin" is here for Follow-up (R shoulder pain) .    03/30/17: Compared to the last office visit on 02/25/17, her previously described R shoulder symptoms are improving w/ much less pain noted after her last visit. Current symptoms are mild & are nonradiating and located mainly to her R scapular region. She has been taking IBU prn.  She has not gone back to PT since her last visit w/ Dr. Paulla Fore in late November 2018.  06/01/17: Compared to the last office visit, her previously described symptoms are improving. Still has some pain when lifting heavy objects. She has started lifting light weights at the gym to strengthen her shoulder.  Current symptoms are mild & are nonradiating She has been taking Advil or Meloxicam prn for the pain with some relief. She feels that she did benefit substantially from OMT at last visit.   07/27/17: Compared to the last office visit on 06/01/17, her previously described R shoulder pain symptoms show no change.  She states that, in general, her R shoulder is improving but notes that it flared up after taking care of her small granddaughter for the past few days. Current symptoms are moderate & are nonradiating She has been taking Advil or Meloxicam prn for pain.  She had OMT at her last visit and feels that this treatment has been helping w/ her symptoms.  09/21/2017: Compared to the last office visit, her previously described symptoms are worsening. She has increased pain when picking up her grandchild. She has pain whenever she tried to do anything strenuous with her R side.  Current symptoms are  moderate & are nonradiating. Pain imrpoves when she props her arm up as opposed to having it by her side.  She has been taking Advil or Meloxicam prn for pain with some relief. She received OMT in the past and tolerated well. She has been doing HEP and [employer]. She reports that she has been unable to access some of the exercises (probably goodman 12 min video) on her phone.   10/21/2017: Compared to the last office visit, her previously described symptoms are worsening, she vacuumed this past Tuesday and that caused pain to flare up. She started having n/t in her arms and fingers again. She has been having trouble sleeping d/t pain, seems to happen after being more active. She is now having pain on the L side of her neck and into the L shoulder.  Current symptoms are moderate & are radiating to both arms.  She has been taking Advil or Meloxicam with some relief. She responded well to OMT at her last visit. She hasn't been doing HEP regularly.   11/21/2017: Compared to the last office visit on 10/21/17, her previously described R shoulder pain symptoms are improving  Current symptoms are mild & are radiating to R UE w/ additional pain noted in her L UE. She has been taking Meloxicam and Advil prn.  She's had OMT at her last few visits and has responded well to these treatments.  She is doing her HEP daily.  She states that she is also having some pain today in her  R lower back which occurred when she was bending down to pick something up off the ground.   REVIEW OF SYSTEMS: Reports night time disturbances. Denies fevers, chills, or night sweats. Denies unexplained weight loss. Denies personal history of cancer. Denies changes in bowel or bladder habits. Denies recent unreported falls. Denies new or worsening dyspnea or wheezing. Reports headaches or dizziness.  Reports numbness, tingling or weakness  In the extremities - BUEs Denies dizziness or presyncopal episodes Denies lower  extremity edema     HISTORY & PERTINENT PRIOR DATA:  Significant/pertinent history, findings, studies include:  reports that she has never smoked. She has never used smokeless tobacco. No results for input(s): HGBA1C, LABURIC, CREATINE in the last 8760 hours. Pre-Visit Info 05/30/2015 9:51 AM  Medication: Reviewed & UTD with the patient.  Preferred Pharmacy and which med where: [id number] - [address], [place]   Allergies verified: NKA  Immunization Status: Flu vaccine-- Declines Tdap-- 07/27/12 PNA-- NA Shingles-- 09/26/14  A/P:   Changes to [place], PSH or Personal Hx: UTD Pap-- 09/27/14 w/ Dr. Ammie Dalton at [facility]; normal; patient reported MMG-- 09/27/14 at [facility]; normal; pt. reported Bone Density-- per patient, it has not been completed. CCS-- 01/21/10 w/ Dr. Verl Blalock at [facility]; no polyps or cancers; otherwise normal; follow-up in 10 years.  Care Teams Updated:  Dr. Ammie Dalton - Gynecology  Dr. Andrey Spearman - Neurology  ED/Hospital/Urgent Care Visits: Per the patient, no recent visits to the ED/Hospital or Urgent Care.  To Discuss with Provider: No concerns at the time of call. No problems updated.  Otherwise prior history reviewed and updated per electronic medical record.   OBJECTIVE:  VS:  HT:5\' 1"  (154.9 cm)   WT:114 lb 12.8 oz (52.1 kg)  BMI:21.7    BP:(Abnormal) 100/56  HR:(Abnormal) 58bpm  TEMP: ( )  RESP:94 %   PHYSICAL EXAM: CONSTITUTIONAL: Well-developed, Well-nourished and In no acute distress Alert & appropriately interactive. and Not depressed or anxious appearing. RESPIRATORY: No increased work of breathing and Trachea Midline EYES: Pupils are equal., EOM intact without nystagmus. and No scleral icterus.  Upper and Lower extremities: Warm and well perfused NEURO: unremarkable Normal associated myotomal distribution strength to manual muscle testing Normal sensation to light touch  MSK  Exam: Neck and back: . Well aligned, no significant deformity. . No overlying skin changes. . No focal bony tenderness . TTP over Paraspinal muscle spasms both cervical thoracic and lumbar regions. . Range of motion of the cervical thoracic and lumbar spine is limited with sidebending and rotation.  She is slightly rotated to the left on the thoracic spine.  Limited right rotation. . Normal, non-painful: Lhermitte's compression test, Spurling's compression test, arm squeeze test and brachial plexus squeeze.  Normal straight leg raise, internal and external range of motion  PROCEDURES & DATA REVIEWED:  . Osteopathic manipulation was performed today based on physical exam findings.  Please see procedure note for further information including Osteopathic Exam findings  ASSESSMENT   1. Chronic right shoulder pain   2. Acute right-sided low back pain without sciatica   3. Somatic dysfunction of thoracic region   4. Somatic dysfunction of lumbar region   5. Somatic dysfunction of pelvis region   6. Somatic dysfunction of sacral region   7. Somatic dysfunction of cervical region   8. Somatic dysfunction of rib cage region     PLAN:   Continue your home exercise program    .  Please see procedure section and notes. . Doing well with osteopathic manipulation and home exercise program. . Reevaluation in 4 weeks and consider repeat OMT at that time if persistent symptoms. No problem-specific Assessment & Plan notes found for this encounter.  Follow-up: Return in about 4 weeks (around 12/19/2017) for consideration of repeat Osteopathic Manipulation.      Please see additional documentation for Objective, Assessment and Plan sections. Pertinent additional documentation may be included in corresponding procedure notes, imaging studies, problem based documentation and patient instructions. Please see these sections of the encounter for additional information regarding this visit.  CMA/ATC served  as Education administrator during this visit. History, Physical, and Plan performed by medical provider. Documentation and orders reviewed and attested to.      Gerda Diss, [place] [facility] Physician

## 2017-11-23 DIAGNOSIS — F411 Generalized anxiety disorder: Secondary | ICD-10-CM | POA: Diagnosis not present

## 2017-11-23 DIAGNOSIS — F3181 Bipolar II disorder: Secondary | ICD-10-CM | POA: Diagnosis not present

## 2017-11-25 ENCOUNTER — Encounter: Payer: Self-pay | Admitting: Sports Medicine

## 2017-12-05 ENCOUNTER — Encounter: Payer: Self-pay | Admitting: Physician Assistant

## 2017-12-05 ENCOUNTER — Ambulatory Visit (INDEPENDENT_AMBULATORY_CARE_PROVIDER_SITE_OTHER): Payer: Medicare Other | Admitting: Physician Assistant

## 2017-12-05 ENCOUNTER — Telehealth: Payer: Self-pay | Admitting: Emergency Medicine

## 2017-12-05 ENCOUNTER — Ambulatory Visit: Payer: Medicare Other | Admitting: Family Medicine

## 2017-12-05 ENCOUNTER — Ambulatory Visit: Payer: Self-pay | Admitting: Family Medicine

## 2017-12-05 ENCOUNTER — Other Ambulatory Visit: Payer: Self-pay

## 2017-12-05 VITALS — BP 112/74 | HR 54 | Temp 98.4°F | Resp 16 | Ht 61.0 in | Wt 114.2 lb

## 2017-12-05 DIAGNOSIS — R002 Palpitations: Secondary | ICD-10-CM | POA: Diagnosis not present

## 2017-12-05 NOTE — Telephone Encounter (Signed)
Called and advised pt, pt media in the chart she is not due until 2021. Pt stated an understanding and is ok with waiting.

## 2017-12-05 NOTE — Telephone Encounter (Signed)
Last colonoscopy was 01/21/2010 rpt 10 yrs Please advise  Copied from [facility] [place] #[id number]. Topic: EmmiPrevent >> Dec 05, 2017 10:42 AM Margot Ables wrote: Reason for CRM: pt would like to have the cologuard screening if you could order for her

## 2017-12-05 NOTE — Patient Instructions (Signed)
Please go to the lab today for blood work.  I will call you with your results. We will alter treatment regimen(s) if indicated by your results.   You will be contacted for Holter Monitory study.  I will call you with these results and we will discuss next steps.   Avoid caffeine and alcohol.  If you note any chest pain or shortness of breath with an episode, please go to the ER for assessment.

## 2017-12-05 NOTE — Telephone Encounter (Signed)
She called in c/o having intermittent episodes of feeling like her heart is racing and skipping beats "for a while now" that she especially notices at night when she lays down to sleep.  She denies any other symptoms with these episodes.   She was unable to tell me how long this has been occurring other than "a while" when I question her further.   She thinks it's stress and sometimes lack of sleep.  I went over the care advice with her and she denies drinking caffeine or alcohol.   She has been checked for this before but "my heart is always beating right when they check it".  I scheduled her with Gwyndolyn Saxon "Einar Pheasant" Hassell Done, PA-C because she needed an afternoon appt and Dr. Birdie Riddle was not available for an afternoon appt within the 3 day protocol.   She has seen "Einar Pheasant" before and was ok seeing him for this.    I made an appt with for today at 3:15.   Reason for Disposition . Age > 60 years (Exception: brief heart beat symptoms that went away and now feels well) . [1] Palpitations AND [2] no improvement after using CARE ADVICE  Answer Assessment - Initial Assessment Questions 1. DESCRIPTION: "Please describe your heart rate or heart beat that you are having" (e.g., fast/slow, regular/irregular, skipped or extra beats, "palpitations")     I feel like I'm having palpitations.   Mychart tells me it's time for my wellness check.   She just had it done in March 2019.  When I lay down at night it flutters.   It misses beats.   Sometimes during the day. 2. ONSET: "When did it start?" (Minutes, hours or days)      I don't know.   I think it's stress.    I do miss sleep at times. 3. DURATION: "How long does it last" (e.g., seconds, minutes, hours)     Lasts a few seconds. 4. PATTERN "Does it come and go, or has it been constant since it started?"  "Does it get worse with exertion?"   "Are you feeling it now?"     It happens most night when I lay down at night.   I have a low heart rate anyway.    I take  Propanolol so it's in the 40's as my normal.   5. TAP: "Using your hand, can you tap out what you are feeling on a chair or table in front of you, so that I can hear?" (Note: not all patients can do this)       *No Answer* 6. HEART RATE: "Can you tell me your heart rate?" "How many beats in 15 seconds?"  (Note: not all patients can do this)       40's and 50's and my doctor is fine with that.   I go to a sports medicine doctor. 7. RECURRENT SYMPTOM: "Have you ever had this before?" If so, ask: "When was the last time?" and "What happened that time?"      Yes.  I did a stress test 7 years ago and everything was fine. 8. CAUSE: "What do you think is causing the palpitations?"     Stress.  I mostly notice it at night. 9. CARDIAC HISTORY: "Do you have any history of heart disease?" (e.g., heart attack, angina, bypass surgery, angioplasty, arrhythmia)      No history. 10. OTHER SYMPTOMS: "Do you have any other symptoms?" (e.g., dizziness, chest pain, sweating, difficulty breathing)  None of the above. 11. PREGNANCY: "Is there any chance you are pregnant?" "When was your last menstrual period?"       Not asked due to age  Protocols used: [place]

## 2017-12-05 NOTE — Telephone Encounter (Signed)
From chart review, she had a colonoscopy in 2011; If this was done at that time and was normal, she doesn't need further colorectal cancer screening until 2021; at that time she and dr. Birdie Riddle can discuss if cologuard is recommended.

## 2017-12-05 NOTE — Progress Notes (Signed)
Patient presents to clinic today c/o episodes of palpitations/skipped beats occurring at rest, more common at night. This has been occurring for several months per patient. Notes they last a few seconds before resolving. Is occurring about 1-2 x day over the past few weeks. Patient denies chest pain, lightheadedness, dizziness, vision changes or frequent headaches. Denies change in diet, activity level or medications. Does not drink alcohol. Denies caffeine intake. Has noted some stressors over the past couple of weeks but this is better.   Past Medical History:  Diagnosis Date  . Allergy   . Anxiety   . Depression   . Hyperlipidemia   . Hypertension   . Kidney stones   . Persistent headaches   . Pneumonia   . Shingles outbreak 02/2014    Current Outpatient Medications on File Prior to Visit  Medication Sig Dispense Refill  . albuterol (PROVENTIL HFA;VENTOLIN HFA) 108 (90 Base) MCG/ACT inhaler Inhale 2 puffs into the lungs every 6 (six) hours as needed for wheezing or shortness of breath. 1 Inhaler 2  . atorvastatin (LIPITOR) 20 MG tablet TAKE 1 TABLET BY MOUTH EVERY DAY 30 tablet 6  . Calcium-Vitamin D-Vitamin K (CALCIUM + D + K PO) Take 3 each by mouth daily. Calcium 548m- Vitamin D 1000iu- K 419m    . clonazePAM (KLONOPIN) 0.5 MG tablet Take 0.25 mg by mouth at bedtime as needed.   2  . escitalopram (LEXAPRO) 10 MG tablet Take 10 mg by mouth daily.     . Fluocinolone-Emollient (FLUOCINOLONE CREAM & EMOLLIENT EX) Apply topically.    . gabapentin (NEURONTIN) 100 MG capsule Take 1 capsule (100 mg total) by mouth 2 (two) times daily. 60 capsule 6  . Glucosamine-Chondroitin (MOVE FREE PO) Take 1 tablet by mouth daily.    . Marland Kitchenbuprofen (ADVIL,MOTRIN) 200 MG tablet Take 200 mg by mouth every 6 (six) hours as needed.    . Marland Kitchenetoconazole-Hydrocortisone 2 & 1 % KIT Apply topically as needed. Cracks on mouth    . lamoTRIgine (LAMICTAL) 100 MG tablet TAKE 10053mn the morning and 200 mg by mouth  before bed.    . Melatonin 3 MG CAPS Take 1 capsule by mouth at bedtime.    . meloxicam (MOBIC) 15 MG tablet TAKE 1 TABLET BY MOUTH EVERY DAY 30 tablet 1  . Naproxen Sodium (ALEVE) 220 MG CAPS As needed for headache 60 each   . propranolol (INDERAL) 40 MG tablet Take 1 tablet (40 mg total) by mouth 2 (two) times daily. 180 tablet 4  . QUEtiapine (SEROQUEL XR) 300 MG 24 hr tablet Take 300 mg by mouth daily.     . traZODone (DESYREL) 50 MG tablet Take 50 mg by mouth at bedtime as needed for sleep.     . tMarland Kitcheniamterene-hydrochlorothiazide (MAXZIDE-25) 37.5-25 MG tablet TAKE 1 TABLET BY MOUTH EVERY DAY 30 tablet 6   No current facility-administered medications on file prior to visit.     No Known Allergies  Family History  Problem Relation Age of Onset  . Hypertension Mother   . Coronary artery disease Mother   . Heart failure Mother   . Hypertension Father   . Liver disease Sister        liver failure  . Arrhythmia Brother   . Hypertension Brother   . Cancer Neg Hx        colon   . Breast cancer Neg Hx     Social History   Socioeconomic History  . Marital  status: Legally Separated    Spouse name: Not on file  . Number of children: 2  . Years of education: Not on file  . Highest education level: Not on file  Occupational History  . Occupation: Control and instrumentation engineer  Social Needs  . Financial resource strain: Not on file  . Food insecurity:    Worry: Not on file    Inability: Not on file  . Transportation needs:    Medical: Not on file    Non-medical: Not on file  Tobacco Use  . Smoking status: Never Smoker  . Smokeless tobacco: Never Used  Substance and Sexual Activity  . Alcohol use: No    Alcohol/week: 0.0 standard drinks    Comment: rare  . Drug use: No  . Sexual activity: Not on file  Lifestyle  . Physical activity:    Days per week: Not on file    Minutes per session: Not on file  . Stress: Not on file  Relationships  . Social connections:    Talks on phone:  Not on file    Gets together: Not on file    Attends religious service: Not on file    Active member of club or organization: Not on file    Attends meetings of clubs or organizations: Not on file    Relationship status: Not on file  Other Topics Concern  . Not on file  Social History Narrative   Regular exercise- yes, daily running, elliptical    Diet: fruits and veggies, water    No caffeine   Review of Systems - See HPI.  All other ROS are negative.  BP 112/74   Pulse (!) 54   Temp 98.4 F (36.9 C) (Oral)   Resp 16   Ht 5' 1"  (1.549 m)   Wt 114 lb 4 oz (51.8 kg)   SpO2 98%   BMI 21.59 kg/m   Physical Exam  Constitutional: She is oriented to person, place, and time. She appears well-developed and well-nourished.  HENT:  Head: Normocephalic and atraumatic.  Eyes: Conjunctivae are normal.  Neck: Neck supple. No thyromegaly present.  Cardiovascular: Regular rhythm, normal heart sounds and intact distal pulses. Bradycardia present.  Pulmonary/Chest: Effort normal and breath sounds normal.  Neurological: She is alert and oriented to person, place, and time.  Psychiatric: She has a normal mood and affect.  Vitals reviewed.  Assessment/Plan: 1. Palpitations EKG with sinus bradycardia (this is her baseline HR 2/2 keeping very active and her chronic propranolol). Will check TSH levels. Supportive measures reviewed. Order for Holter study placed for further assessment. ER precautions reviewed with patient.   - EKG 12-Lead - TSH - Holter monitor - 72 hour; Future   Leeanne Rio, PA-C

## 2017-12-06 ENCOUNTER — Telehealth: Payer: Self-pay | Admitting: Family Medicine

## 2017-12-06 DIAGNOSIS — R002 Palpitations: Secondary | ICD-10-CM

## 2017-12-06 LAB — TSH: TSH: 0.97 u[IU]/mL (ref 0.35–4.50)

## 2017-12-06 NOTE — Telephone Encounter (Signed)
Copied from [place] [id number]. Topic: General - Other >> Dec 06, 2017 12:12 PM Valla Leaver wrote: Reason for CRM: Ivin Booty, [facility] with [facility] calling to let Elyn Aquas know they only have 24 or 48 hour heart monitoring, but not 72 hours.

## 2017-12-06 NOTE — Telephone Encounter (Signed)
Order has been changed in system. 48 hour monitor ordered

## 2017-12-09 ENCOUNTER — Ambulatory Visit: Payer: Medicare Other | Admitting: Family Medicine

## 2017-12-12 ENCOUNTER — Encounter: Payer: Self-pay | Admitting: Sports Medicine

## 2017-12-15 ENCOUNTER — Ambulatory Visit (INDEPENDENT_AMBULATORY_CARE_PROVIDER_SITE_OTHER): Payer: Medicare Other

## 2017-12-15 DIAGNOSIS — R002 Palpitations: Secondary | ICD-10-CM

## 2017-12-19 ENCOUNTER — Encounter: Payer: Self-pay | Admitting: Sports Medicine

## 2017-12-19 ENCOUNTER — Ambulatory Visit (INDEPENDENT_AMBULATORY_CARE_PROVIDER_SITE_OTHER): Payer: Medicare Other | Admitting: Sports Medicine

## 2017-12-19 VITALS — BP 110/62 | HR 51 | Ht 61.0 in | Wt 113.0 lb

## 2017-12-19 DIAGNOSIS — M9908 Segmental and somatic dysfunction of rib cage: Secondary | ICD-10-CM | POA: Diagnosis not present

## 2017-12-19 DIAGNOSIS — M9901 Segmental and somatic dysfunction of cervical region: Secondary | ICD-10-CM | POA: Diagnosis not present

## 2017-12-19 DIAGNOSIS — M25511 Pain in right shoulder: Secondary | ICD-10-CM

## 2017-12-19 DIAGNOSIS — M9902 Segmental and somatic dysfunction of thoracic region: Secondary | ICD-10-CM | POA: Diagnosis not present

## 2017-12-19 DIAGNOSIS — H40003 Preglaucoma, unspecified, bilateral: Secondary | ICD-10-CM | POA: Diagnosis not present

## 2017-12-19 DIAGNOSIS — G8929 Other chronic pain: Secondary | ICD-10-CM | POA: Diagnosis not present

## 2017-12-19 DIAGNOSIS — H40033 Anatomical narrow angle, bilateral: Secondary | ICD-10-CM | POA: Diagnosis not present

## 2017-12-19 NOTE — Progress Notes (Signed)
PROCEDURE NOTE : OSTEOPATHIC MANIPULATION The decision today to treat with Osteopathic Manipulative Therapy (OMT) was based on physical exam findings. Verbal consent was obtained following a discussion with the patient regarding the of risks, benefits and potential side effects, including an acute pain flare,post manipulation soreness and need for repeat treatments.     Contraindications to OMT: NONE  Manipulation was performed as below: Regions Treated OMT Techniques Used  Cervical spine Thoracic spine Ribs Upper extremities HVLA muscle energy myofascial release soft tissue facilitated positional release   The patient tolerated the treatment well and reported Improved symptoms following treatment today. Patient was given medications, exercises, stretches and lifestyle modifications per AVS and verbally.   OSTEOPATHIC/STRUCTURAL EXAM:   OA - rotated right C4 Extended, rotated left, side bent right T2 FRS right (Flexed, Rotated & Sidebent) Rib 6 Left  Posterior Internally rotated glenohumeral joint with pec contracture Right pec minor tenderpoint Right trapezius tenderpoint

## 2017-12-19 NOTE — Patient Instructions (Addendum)

## 2017-12-19 NOTE — Progress Notes (Signed)
[name]. [place], [place] at [facility] [phone]  JASIAH BUNTIN - 62 y.o. female MRN [id number]  Date of birth: 1955-12-09  Visit Date: 12/19/2017  PCP: [name], MD   Referred by: [name], MD  Scribe(s) for today's visit: Josepha Pigg, CMA  SUBJECTIVE:  Gearldine Shown Girgis "[name]" is here for Follow-up (R shoulder pain) .    HPI 03/30/17: Compared to the last office visit on 02/25/17, her previously described R shoulder symptoms are improving w/ much less pain noted after her last visit. Current symptoms are mild & are nonradiating and located mainly to her R scapular region. She has been taking IBU prn.  She has not gone back to PT since her last visit w/ Dr. Paulla Fore in late November 2018.  06/01/17: Compared to the last office visit, her previously described symptoms are improving. Still has some pain when lifting heavy objects. She has started lifting light weights at the gym to strengthen her shoulder.  Current symptoms are mild & are nonradiating She has been taking Advil or Meloxicam prn for the pain with some relief. She feels that she did benefit substantially from OMT at last visit.   07/27/17: Compared to the last office visit on 06/01/17, her previously described R shoulder pain symptoms show no change.  She states that, in general, her R shoulder is improving but notes that it flared up after taking care of her small granddaughter for the past few days. Current symptoms are moderate & are nonradiating She has been taking Advil or Meloxicam prn for pain.  She had OMT at her last visit and feels that this treatment has been helping w/ her symptoms.  09/21/2017: Compared to the last office visit, her previously described symptoms are worsening. She has increased pain when picking up her grandchild. She has pain whenever she tried to do anything strenuous with her R side.  Current symptoms are  moderate & are nonradiating. Pain imrpoves when she props her arm up as opposed to having it by her side.  She has been taking Advil or Meloxicam prn for pain with some relief. She received OMT in the past and tolerated well. She has been doing HEP and [employer]. She reports that she has been unable to access some of the exercises (probably goodman 12 min video) on her phone.   10/21/2017: Compared to the last office visit, her previously described symptoms are worsening, she vacuumed this past Tuesday and that caused pain to flare up. She started having n/t in her arms and fingers again. She has been having trouble sleeping d/t pain, seems to happen after being more active. She is now having pain on the L side of her neck and into the L shoulder.  Current symptoms are moderate & are radiating to both arms.  She has been taking Advil or Meloxicam with some relief. She responded well to OMT at her last visit. She hasn't been doing HEP regularly.   11/21/2017: Compared to the last office visit on 10/21/17, her previously described R shoulder pain symptoms are improving  Current symptoms are mild & are radiating to R UE w/ additional pain noted in her L UE. She has been taking Meloxicam and Advil prn.  She's had OMT at her last few visits and has responded well to these treatments.  She is doing her HEP daily. She states that she is also having some pain today in her R  lower back which occurred when she was bending down to pick something up off the ground.  12/19/2017: Compared to the last office visit, her previously described symptoms are improving. She reports 1 flare-up since her last visit.  Current symptoms are mild & are radiating to both arms to the fingers.  She has been doing HEP with no trouble. She takes Advil or Aleve prn (for HA). She has d/c Meloxicam but has it if needed.  Pt reports no significant changes since her last OV.   REVIEW OF SYSTEMS: Denies night time  disturbances. Denies fevers, chills, or night sweats. Denies unexplained weight loss. Denies personal history of cancer. Denies changes in bowel or bladder habits. Denies recent unreported falls. Denies new or worsening dyspnea or wheezing. Reports headaches or dizziness.  Reports numbness, tingling or weakness  In the extremities - BUEs Denies dizziness or presyncopal episodes Denies lower extremity edema     HISTORY & PERTINENT PRIOR DATA:  Significant/pertinent history, findings, studies include:  reports that she has never smoked. She has never used smokeless tobacco. No results for input(s): HGBA1C, LABURIC, CREATINE in the last 8760 hours. Pre-Visit Info 05/30/2015 9:51 AM  Medication: Reviewed & UTD with the patient.  Preferred Pharmacy and which med where: [id number] - [address], [place]   Allergies verified: NKA  Immunization Status: Flu vaccine-- Declines Tdap-- 07/27/12 PNA-- NA Shingles-- 09/26/14  A/P:   Changes to Downs, PSH or Personal Hx: UTD Pap-- 09/27/14 w/ Dr. Ammie Dalton at [facility]; normal; patient reported MMG-- 09/27/14 at [facility]; normal; pt. reported Bone Density-- per patient, it has not been completed. CCS-- 01/21/10 w/ Dr. Verl Blalock at [facility]; no polyps or cancers; otherwise normal; follow-up in 10 years.  Care Teams Updated:  Dr. Ammie Dalton - Gynecology  Dr. Andrey Spearman - Neurology  ED/Hospital/Urgent Care Visits: Per the patient, no recent visits to the ED/Hospital or Urgent Care.  To Discuss with Provider: No concerns at the time of call. No problems updated.  Otherwise prior history reviewed and updated per electronic medical record.   OBJECTIVE:  VS:  HT:5\' 1"  (154.9 cm)   WT:113 lb (51.3 kg)  BMI:21.36    BP:110/62  HR:(!) 51bpm  TEMP: ( )  RESP:97 %   PHYSICAL EXAM: CONSTITUTIONAL: Well-developed, Well-nourished and In no acute distress Psychiatric: Alert &  appropriately interactive. and Not depressed or anxious appearing. RESPIRATORY: No increased work of breathing and Trachea Midline EYES: Pupils are equal., EOM intact without nystagmus. and No scleral icterus.  Upper and Lower extremities: EXTREMITY EXAM: Warm and well perfused NEURO: unremarkable Normal associated myotomal distribution strength to manual muscle testing Normal sensation to light touch  MSK Exam: Neck and back: . Well aligned, no significant deformity. . No overlying skin changes. . No focal bony tenderness . TTP over Paraspinal muscle spasms both cervical and thoracic spines. Improved but still present . Range of motion of the cervical, and thoracic spine is limited with sidebending and rotation.  She is slightly rotated to the left on the thoracic spine.  . Normal, non-painful: Lhermitte's compression test, Spurling's compression test, arm squeeze test and brachial plexus squeeze.   ASSESSMENT   1. Chronic right shoulder pain   2. Somatic dysfunction of cervical region   3. Somatic dysfunction of thoracic region   4. Somatic dysfunction of rib cage region     PLAN:  Pertinent additional documentation may be included in corresponding procedure notes, imaging  studies, problem based documentation and patient instructions.  Procedures:  . Osteopathic manipulation was performed today based on physical exam findings.  Please see procedure note for further information including Osteopathic Exam findings  Medications:  No orders of the defined types were placed in this encounter.  Discussion/Instructions: No problem-specific Assessment & Plan notes found for this encounter.  Marland Kitchen Responding well to OMT and HEP. Add new cervical Presenter, broadcasting. Holds stess in cervicothoracic region. . Links to [employer] provided today per Patient Instructions.  These exercises were developed by Minerva Ends, DC with a strong emphasis on core neuromuscular  reducation and postural realignment through body-weight exercises. . Discussed red flag symptoms that warrant earlier emergent evaluation and patient voices understanding. . Activity modifications and the importance of avoiding exacerbating activities (limiting pain to no more than a 4 / 10 during or following activity) recommended and discussed.  Follow-up:  . Return in about 4 weeks (around 01/16/2018).   . If any lack of improvement consider: further diagnostic evaluation with Further cervical spine evaluation or MRI of the shoulder.  . At follow up will plan to consider: repeat osteopathic manipulation     CMA/ATC served as scribe during this visit. History, Physical, and Plan performed by medical provider. Documentation and orders reviewed and attested to.      Gerda Diss, [place] [facility] Physician

## 2017-12-22 ENCOUNTER — Telehealth: Payer: Self-pay

## 2017-12-22 ENCOUNTER — Other Ambulatory Visit: Payer: Self-pay | Admitting: Certified Nurse Midwife

## 2017-12-22 DIAGNOSIS — Z1239 Encounter for other screening for malignant neoplasm of breast: Secondary | ICD-10-CM

## 2017-12-22 NOTE — Telephone Encounter (Signed)
Per [facility], pt aware mammogram order is in.

## 2017-12-22 NOTE — Telephone Encounter (Signed)
Pt is schedule for AE w/[facility] 02-20-18. Her mammogram is scheduled for 01/26/18. Cb#[phone]

## 2017-12-23 DIAGNOSIS — F411 Generalized anxiety disorder: Secondary | ICD-10-CM | POA: Diagnosis not present

## 2017-12-23 DIAGNOSIS — F3181 Bipolar II disorder: Secondary | ICD-10-CM | POA: Diagnosis not present

## 2018-01-16 ENCOUNTER — Encounter: Payer: Self-pay | Admitting: Sports Medicine

## 2018-01-16 ENCOUNTER — Ambulatory Visit (INDEPENDENT_AMBULATORY_CARE_PROVIDER_SITE_OTHER): Payer: Medicare Other | Admitting: Sports Medicine

## 2018-01-16 VITALS — BP 106/60 | HR 55 | Ht 61.0 in | Wt 110.8 lb

## 2018-01-16 DIAGNOSIS — M25511 Pain in right shoulder: Secondary | ICD-10-CM | POA: Diagnosis not present

## 2018-01-16 DIAGNOSIS — M9901 Segmental and somatic dysfunction of cervical region: Secondary | ICD-10-CM | POA: Diagnosis not present

## 2018-01-16 DIAGNOSIS — G8929 Other chronic pain: Secondary | ICD-10-CM | POA: Diagnosis not present

## 2018-01-16 DIAGNOSIS — M9908 Segmental and somatic dysfunction of rib cage: Secondary | ICD-10-CM | POA: Diagnosis not present

## 2018-01-16 DIAGNOSIS — M9902 Segmental and somatic dysfunction of thoracic region: Secondary | ICD-10-CM | POA: Diagnosis not present

## 2018-01-16 NOTE — Progress Notes (Signed)
[name]. Alishea Beaudin, [place] at [facility] [phone]  LEANNY MOECKEL - 62 y.o. female MRN [id number]  Date of birth: 1955-05-30  Visit Date: 01/16/2018  PCP: [name], MD   Referred by: [name], MD  Scribe(s) for today's visit: Josepha Pigg, CMA  SUBJECTIVE:  Gear[place] Shown Wiens "[name]" is here for Follow-up (R shoulder pain) .    HPI 03/30/17: Compared to the last office visit on 02/25/17, her previously described R shoulder symptoms are improving w/ much less pain noted after her last visit. Current symptoms are mild & are nonradiating and located mainly to her R scapular region. She has been taking IBU prn.  She has not gone back to PT since her last visit w/ Dr. Paulla Fore in late November 2018.  06/01/17: Compared to the last office visit, her previously described symptoms are improving. Still has some pain when lifting heavy objects. She has started lifting light weights at the gym to strengthen her shoulder.  Current symptoms are mild & are nonradiating She has been taking Advil or Meloxicam prn for the pain with some relief. She feels that she did benefit substantially from OMT at last visit.   07/27/17: Compared to the last office visit on 06/01/17, her previously described R shoulder pain symptoms show no change.  She states that, in general, her R shoulder is improving but notes that it flared up after taking care of her small granddaughter for the past few days. Current symptoms are moderate & are nonradiating She has been taking Advil or Meloxicam prn for pain.  She had OMT at her last visit and feels that this treatment has been helping w/ her symptoms.  09/21/2017: Compared to the last office visit, her previously described symptoms are worsening. She has increased pain when picking up her grandchild. She has pain whenever she tried to do anything strenuous with her R side.  Current symptoms are  moderate & are nonradiating. Pain imrpoves when she props her arm up as opposed to having it by her side.  She has been taking Advil or Meloxicam prn for pain with some relief. She received OMT in the past and tolerated well. She has been doing HEP and [employer]. She reports that she has been unable to access some of the exercises (probably goodman 12 min video) on her phone.   10/21/2017: Compared to the last office visit, her previously described symptoms are worsening, she vacuumed this past Tuesday and that caused pain to flare up. She started having n/t in her arms and fingers again. She has been having trouble sleeping d/t pain, seems to happen after being more active. She is now having pain on the L side of her neck and into the L shoulder.  Current symptoms are moderate & are radiating to both arms.  She has been taking Advil or Meloxicam with some relief. She responded well to OMT at her last visit. She hasn't been doing HEP regularly.   11/21/2017: Compared to the last office visit on 10/21/17, her previously described R shoulder pain symptoms are improving  Current symptoms are mild & are radiating to R UE w/ additional pain noted in her L UE. She has been taking Meloxicam and Advil prn.  She's had OMT at her last few visits and has responded well to these treatments.  She is doing her HEP daily. She states that she is also having some pain today in her R  lower back which occurred when she was bending down to pick something up off the ground.  12/19/2017: Compared to the last office visit, her previously described symptoms are improving. She reports 1 flare-up since her last visit.  Current symptoms are mild & are radiating to both arms to the fingers.  She has been doing HEP with no trouble. She takes Advil or Aleve prn (for HA). She has d/c Meloxicam but has it if needed.  Pt reports no significant changes since her last OV.  01/16/2018: Compared to the last office visit, her  previously described symptoms are improving. She still has pain when trying to reach back and when lifting.  Current symptoms are mild & are radiating to both arms to the fingers. She reports no change to medication regimen or other modalities for shoulder pain.   REVIEW OF SYSTEMS: Denies night time disturbances. Denies fevers, chills, or night sweats. Denies unexplained weight loss. Denies personal history of cancer. Denies changes in bowel or bladder habits. Denies recent unreported falls. Denies new or worsening dyspnea or wheezing. Reports headaches or dizziness.  Reports numbness, tingling or weakness  In the extremities - BUEs Denies dizziness or presyncopal episodes Denies lower extremity edema     HISTORY & PERTINENT PRIOR DATA:  Significant/pertinent history, findings, studies include:  reports that she has never smoked. She has never used smokeless tobacco. No results for input(s): HGBA1C, LABURIC, CREATINE in the last 8760 hours. Pre-Visit Info 05/30/2015 9:51 AM  Medication: Reviewed & UTD with the patient.  Preferred Pharmacy and which med where: [id number] - [address], [place]   Allergies verified: NKA  Immunization Status: Flu vaccine-- Declines Tdap-- 07/27/12 PNA-- NA Shingles-- 09/26/14  A/P:   Changes to [place], PSH or Personal Hx: UTD Pap-- 09/27/14 w/ Dr. Ammie Dalton at [facility]; normal; patient reported MMG-- 09/27/14 at [facility]; normal; pt. reported Bone Density-- per patient, it has not been completed. CCS-- 01/21/10 w/ Dr. Verl Blalock at [facility]; no polyps or cancers; otherwise normal; follow-up in 10 years.  Care Teams Updated:  Dr. Ammie Dalton - Gynecology  Dr. Andrey Spearman - Neurology  ED/Hospital/Urgent Care Visits: Per the patient, no recent visits to the ED/Hospital or Urgent Care.  To Discuss with Provider: No concerns at the time of call. No problems updated.  Otherwise prior history  reviewed and updated per electronic medical record.   OBJECTIVE:  VS:  HT:5\' 1"  (154.9 cm)   WT:110 lb 12.8 oz (50.3 kg)  BMI:20.95    BP:106/60  HR:(!) 55bpm  TEMP: ( )  RESP:96 %   PHYSICAL EXAM: CONSTITUTIONAL: Well-developed, Well-nourished and In no acute distress Psychiatric: Alert & appropriately interactive. and Not depressed or anxious appearing. RESPIRATORY: No increased work of breathing and Trachea Midline EYES: Pupils are equal., EOM intact without nystagmus. and No scleral icterus.  Upper and Lower extremities: EXTREMITY EXAM: Warm and well perfused NEURO: unremarkable Normal associated myotomal distribution strength to manual muscle testing Normal sensation to light touch  MSK Exam: Neck and back: . Well aligned, no significant deformity. . No overlying skin changes. . No focal bony tenderness . TTP over Right greater than left paraspinal muscles.  Right omohyoid restriction.  Markedly tight pectoralis major muscles on the right with limited external rotation of the shoulder. . Range of motion of the cervical, and thoracic spine is limited with sidebending and rotation.  She is slightly rotated to the left on the thoracic spine.  Marland Kitchen  Normal, non-painful: Lhermitte's compression test, Spurling's compression test, arm squeeze test and brachial plexus squeeze.   ASSESSMENT   1. Chronic right shoulder pain   2. Somatic dysfunction of cervical region   3. Somatic dysfunction of thoracic region   4. Somatic dysfunction of rib cage region     PLAN:  Pertinent additional documentation may be included in corresponding procedure notes, imaging studies, problem based documentation and patient instructions.  Procedures:  . Osteopathic manipulation was performed today based on physical exam findings.  Please see procedure note for further information including Osteopathic Exam findings  Medications:  No orders of the defined types were placed in this  encounter.  Discussion/Instructions: No problem-specific Assessment & Plan notes found for this encounter.  Marland Kitchen Responding well to OMT and HEP. Holds stess in cervicothoracic region. . Continue previously prescribed home exercise program.  . Discussed red flag symptoms that warrant earlier emergent evaluation and patient voices understanding. . Activity modifications and the importance of avoiding exacerbating activities (limiting pain to no more than a 4 / 10 during or following activity) recommended and discussed.  Follow-up:  . Return in about 4 weeks (around 02/13/2018).   . If any lack of improvement consider: further diagnostic evaluation with Further cervical spine evaluation or MRI of the shoulder.  . At follow up will plan to consider: repeat osteopathic manipulation     CMA/ATC served as scribe during this visit. History, Physical, and Plan performed by medical provider. Documentation and orders reviewed and attested to.      Gerda Diss, [place] [facility] Physician

## 2018-01-16 NOTE — Progress Notes (Signed)
PROCEDURE NOTE : OSTEOPATHIC MANIPULATION The decision today to treat with Osteopathic Manipulative Therapy (OMT) was based on physical exam findings. Verbal consent was obtained following a discussion with the patient regarding the of risks, benefits and potential side effects, including an acute pain flare,post manipulation soreness and need for repeat treatments.     Contraindications to OMT: NONE  Manipulation was performed as below: Regions Treated OMT Techniques Used  Cervical spine Thoracic spine Ribs HVLA muscle energy myofascial release soft tissue facilitated positional release   The patient tolerated the treatment well and reported Improved symptoms following treatment today. Patient was given medications, exercises, stretches and lifestyle modifications per AVS and verbally.   OSTEOPATHIC/STRUCTURAL EXAM:   OA - rotated right C4 Extended, rotated left, side bent right T2 FRS right (Flexed, Rotated & Sidebent) Rib 6 Left  Posterior Right omohyoid contracture with pectoralis trigger points.

## 2018-01-27 ENCOUNTER — Ambulatory Visit
Admission: RE | Admit: 2018-01-27 | Discharge: 2018-01-27 | Disposition: A | Discharge status: Home / Self Care | Payer: Medicare Other | Source: Ambulatory Visit | Attending: Certified Nurse Midwife | Admitting: Certified Nurse Midwife

## 2018-01-27 DIAGNOSIS — Z1239 Encounter for other screening for malignant neoplasm of breast: Secondary | ICD-10-CM

## 2018-01-27 DIAGNOSIS — Z1231 Encounter for screening mammogram for malignant neoplasm of breast: Secondary | ICD-10-CM | POA: Diagnosis not present

## 2018-01-30 ENCOUNTER — Encounter: Payer: Self-pay | Admitting: Diagnostic Neuroimaging

## 2018-01-30 ENCOUNTER — Ambulatory Visit (INDEPENDENT_AMBULATORY_CARE_PROVIDER_SITE_OTHER): Payer: Medicare Other | Admitting: Diagnostic Neuroimaging

## 2018-01-30 VITALS — BP 127/64 | HR 53 | Ht 61.0 in | Wt 113.0 lb

## 2018-01-30 DIAGNOSIS — G44209 Tension-type headache, unspecified, not intractable: Secondary | ICD-10-CM

## 2018-01-30 DIAGNOSIS — G43009 Migraine without aura, not intractable, without status migrainosus: Secondary | ICD-10-CM | POA: Diagnosis not present

## 2018-01-30 MED ORDER — GABAPENTIN 100 MG PO CAPS: 100.0000 mg | ORAL_CAPSULE | Freq: Two times a day (BID) | ORAL | 12 refills | Status: DC

## 2018-01-30 MED ORDER — PROPRANOLOL HCL 40 MG PO TABS: 40.0000 mg | ORAL_TABLET | Freq: Two times a day (BID) | ORAL | 4 refills | Status: DC

## 2018-01-30 NOTE — Patient Instructions (Signed)
  MIGRAINE WITHOUT AURA (worsening; stress related) - continue propranolol 40mg  twice a day  - continue aleve and tylenol as needed migraine - continue exercise, activity, nutrition strategies  MIGRATORY PARESTHESIAS - may increase gabapentin; currently 100mg  twice a day for burning pain; may increase up to 300mg  twice a day

## 2018-01-30 NOTE — Progress Notes (Signed)
[facility]  PATIENT: Theresa Martin DOB: 25-Aug-1955  REFERRING CLINICIAN:  HISTORY FROM: patient REASON FOR VISIT: follow up   HISTORICAL  CHIEF COMPLAINT:  Chief Complaint  Patient presents with  . Follow-up    Rm 7, alone  . Migraine    about same , gabapentin bid not helping.     HISTORY OF PRESENT ILLNESS:   UPDATE (01/30/18, VRP): Since last visit, doing worse with HA (14-15 per month). More stress levels than last visit. Burning sensation in scalp continues.  UPDATE (08/24/17, VRP): Since last visit, doing well. Tolerating propranolol. Avg 4-13 migraine per month. No alleviating or aggravating factors.   Separately, had right thoracic burning pain (no rash) in fall 2018, and was tx'd empirically with acyclovir. Then with right scalp burning pain last week; also with chronic left scalp sensitivity with migraines.  UPDATE 08/24/16: Since last visit, avg 2-3 HA per month. Tolerating meds. No new issues. Mood stable. Weight improved.   UPDATE 02/23/16: Since last visit has ~ 1-5 HA per month. Tolerating propranolol + OTC tylenol/aleve for HA mgmt.   UPDATE 08/19/15: Since last visit, avg 1-10 HA per month. Usually with stress. Some more wt gain noted (~10lbs). Also had a a laser eye procedure for angle closure glaucoma, now improved.  UPDATE 04/15/15: Since last visit, doing well. No more migraine since 02/28/15. Propranolol is helping prevent HA. Mood stable.  UPDATE 01/13/15: Since last visit, having more headaches, and more fluid retention. Now being tapered off gabapentin by psychiatry due to side effects.   UPDATE 09/02/14: Doing well. Avg 3-6 days HA per month. Some HA are mild, some are severe. Taking gabapentin 272m TID. Works out every day at [employer] Overall mood is better as well.  UPDATE 05/17/14: Since last visit, patient had some psychiatry issues, was managed by behavioral health, and her headaches significant improved. She was doing  well for several years and did not follow-up in our clinic. Patient here with her father today for this visit. Patient's father notes that her headaches seem to be significantly associated with her stress levels. In the last few months her stress levels have increased significantly. Her stress levels related to her ex-husband and her daughters, and some family issues. Patient having intermittent, almost daily left-sided headaches, with numbness and tingling in the left scalp, typically in the evening when she is home. During the daytime patient stays active, works out several times a week, and does better. Patient had been on gabapentin 100 mg at bedtime for several years. Her psychiatrist increased this to 2 and then 3 capsules at bedtime. When she did 3 capsules at bedtime, her headache type symptoms paradoxical he worsened. She then went back to taking one capsule at bedtime. Strangely, she reports that she was told she was able to take this medication (gabapentin) as needed as well and for the last few weeks has been taking one capsule at bedtime, followed by 1-2 capsules every hour, throughout the night, taking up to 10-14 capsules in a 12 hour period.   PRIOR HPI (11/04/08 - 06/03/09, VRP): 62year old right-handed female with history of high blood pressure, depression and anxiety presenting for evaluation of chronic headaches and abnormal MRI scan.  On October 26, 2008 the patient presented to the emergency room for right-sided chest pain and was diagnosed with pneumonia.   At this time her headaches worsened in severity.  Upon review of prior MRIs demonstrating microvascular gliosis, patient was referred to our  neurology clinic for futher evaluation. Patient's headaches began in 2005 consisting of a "dull pressure like sensation" over the top of her head. Occasionally these are associated with mild nausea without vomiting as well as intermittent left facial numbness.  Patient had dull nagging low level headaches  on a daily basis with daily flareups involving a hot sensation over her scalp.  Sometimes this sensation starts as posterior neck pain that moves up the back of her head. She was initially evaluated with MRI scans in 2005 and 2006 and started on topiramate 100 mg at bedtime.   In addition she was taking Advil and Tylenol over-the-counter almost a daily basis.   During flareups the patient would have to lay down in place ice packs over her eyes and the top of her head.   Patient's headaches are aggravated by lack of sleep or stress. No food triggers noted.  Her other physicians decided to taper her off of NSAIDs, tylenol and also her topiramate. Patient had MRI of the head and neck, found to have some nonspecific white matter lesions. Patient had lumbar puncture and additional blood testing, but multiple sclerosis was ruled out. Patient was treated with several occipital nerve blocks with good results.   REVIEW OF SYSTEMS: Full 14 system review of systems performed and negative except for: headache depression light sens.    ALLERGIES: No Known Allergies  HOME MEDICATIONS: Outpatient Medications Prior to Visit  Medication Sig Dispense Refill  . albuterol (PROVENTIL HFA;VENTOLIN HFA) 108 (90 Base) MCG/ACT inhaler Inhale 2 puffs into the lungs every 6 (six) hours as needed for wheezing or shortness of breath. 1 Inhaler 2  . atorvastatin (LIPITOR) 20 MG tablet TAKE 1 TABLET BY MOUTH EVERY DAY 30 tablet 6  . Calcium-Vitamin D-Vitamin K (CALCIUM + D + K PO) Take 3 each by mouth daily. Calcium 566m- Vitamin D 1000iu- K 470m    . clonazePAM (KLONOPIN) 0.5 MG tablet Take 0.25 mg by mouth at bedtime as needed.   2  . escitalopram (LEXAPRO) 10 MG tablet Take 10 mg by mouth daily.     . Fluocinolone-Emollient (FLUOCINOLONE CREAM & EMOLLIENT EX) Apply topically.    . gabapentin (NEURONTIN) 100 MG capsule Take 1 capsule (100 mg total) by mouth 2 (two) times daily. 60 capsule 6  . Glucosamine-Chondroitin (MOVE  FREE PO) Take 1 tablet by mouth daily.    . Marland Kitchenbuprofen (ADVIL,MOTRIN) 200 MG tablet Take 200 mg by mouth every 6 (six) hours as needed.    . Marland Kitchenetoconazole-Hydrocortisone 2 & 1 % KIT Apply topically as needed. Cracks on mouth    . lamoTRIgine (LAMICTAL) 100 MG tablet TAKE 10034mn the morning and 200 mg by mouth before bed.    . Melatonin 3 MG CAPS Take 1 capsule by mouth at bedtime.    . meloxicam (MOBIC) 15 MG tablet TAKE 1 TABLET BY MOUTH EVERY DAY 30 tablet 1  . Naproxen Sodium (ALEVE) 220 MG CAPS As needed for headache 60 each   . propranolol (INDERAL) 40 MG tablet Take 1 tablet (40 mg total) by mouth 2 (two) times daily. 180 tablet 4  . QUEtiapine (SEROQUEL XR) 50 MG TB24 24 hr tablet Take 100 mg by mouth at bedtime.     . traZODone (DESYREL) 50 MG tablet Take 50 mg by mouth at bedtime as needed for sleep.     . tMarland Kitcheniamterene-hydrochlorothiazide (MAXZIDE-25) 37.5-25 MG tablet TAKE 1 TABLET BY MOUTH EVERY DAY 30 tablet 6   No facility-administered  medications prior to visit.     PAST MEDICAL HISTORY: Past Medical History:  Diagnosis Date  . Allergy   . Anxiety   . Depression   . Hyperlipidemia   . Hypertension   . Kidney stones   . Persistent headaches   . Pneumonia   . Shingles outbreak 02/2014    PAST SURGICAL HISTORY: Past Surgical History:  Procedure Laterality Date  . CARDIOVASCULAR STRESS TEST  02/2009   treadmill stress test: Low risk  . [place]  . EXPLORATORY LAPAROTOMY     laproscopy for infertility  . EYE SURGERY Bilateral    laser-correct opening b/tn cornea and iris  . MOLE REMOVAL  2017   x 2 moles    FAMILY HISTORY: Family History  Problem Relation Age of Onset  . Hypertension Mother   . Coronary artery disease Mother   . Heart failure Mother   . Hypertension Father   . Liver disease Sister        liver failure  . Arrhythmia Brother   . Hypertension Brother   . Cancer Neg Hx        colon   . Breast cancer Neg Hx     SOCIAL  HISTORY:  Social History   Socioeconomic History  . Marital status: Legally Separated    Spouse name: Not on file  . Number of children: 2  . Years of education: Not on file  . Highest education level: Not on file  Occupational History  . Occupation: Control and instrumentation engineer  Social Needs  . Financial resource strain: Not on file  . Food insecurity:    Worry: Not on file    Inability: Not on file  . Transportation needs:    Medical: Not on file    Non-medical: Not on file  Tobacco Use  . Smoking status: Never Smoker  . Smokeless tobacco: Never Used  Substance and Sexual Activity  . Alcohol use: No    Alcohol/week: 0.0 standard drinks    Comment: rare  . Drug use: No  . Sexual activity: Not on file  Lifestyle  . Physical activity:    Days per week: Not on file    Minutes per session: Not on file  . Stress: Not on file  Relationships  . Social connections:    Talks on phone: Not on file    Gets together: Not on file    Attends religious service: Not on file    Active member of club or organization: Not on file    Attends meetings of clubs or organizations: Not on file    Relationship status: Not on file  . Intimate partner violence:    Fear of current or ex partner: Not on file    Emotionally abused: Not on file    Physically abused: Not on file    Forced sexual activity: Not on file  Other Topics Concern  . Not on file  Social History Narrative   Regular exercise- yes, daily running, elliptical    Diet: fruits and veggies, water    No caffeine     PHYSICAL EXAM  Vitals:   01/30/18 1540  BP: 127/64  Pulse: (!) 53  SpO2: 96%  Weight: 113 lb (51.3 kg)  Height: _0  (1.549 m)   Wt Readings from Last 3 Encounters:  01/30/18 113 lb (51.3 kg)  01/16/18 110 lb 12.8 oz (50.3 kg)  12/19/17 113 lb (51.3 kg)   Body mass index is 21.35 kg/m.  No exam data present  MMSE - Mini Mental State Exam 06/03/2016  Orientation to time 5  Orientation to Place 5    Registration 3  Attention/ Calculation 5  Recall 3  Language- name 2 objects 2  Language- repeat 1  Language- follow 3 step command 3  Language- read & follow direction 1  Write a sentence 1  Copy design 1  Total score 30    GENERAL EXAM: Patient is in no distress; well developed, nourished and groomed; neck is supple  CARDIOVASCULAR: Regular rate and rhythm, no murmurs, no carotid bruits; BRADYCARDIA  NEUROLOGIC: MENTAL STATUS: awake, alert, language fluent, comprehension intact, naming intact, fund of knowledge appropriate CRANIAL NERVE: pupils equal and reactive to light, visual fields full to confrontation, extraocular muscles intact, no nystagmus, facial sensation and strength symmetric, hearing intact, palate elevates symmetrically, uvula midline, shoulder shrug symmetric, tongue midline. MOTOR: normal bulk and tone, full strength in the BUE, BLE SENSORY: normal and symmetric to light touch, temperature, vibration COORDINATION: finger-nose-finger, fine finger movements normal REFLEXES: deep tendon reflexes present and symmetric GAIT/STATION: narrow based gait; romberg is negative    DIAGNOSTIC DATA (LABS, IMAGING, TESTING) - I reviewed patient records, labs, notes, testing and imaging myself where available.  Lab Results  Component Value Date   WBC 8.4 06/08/2017   HGB 12.9 06/08/2017   HCT 37.4 06/08/2017   MCV 88.6 06/08/2017   PLT 263 06/08/2017      Component Value Date/Time   NA 139 06/08/2017 1534   K 4.5 06/08/2017 1534   CL 102 06/08/2017 1534   CO2 30 06/08/2017 1534   GLUCOSE 89 06/08/2017 1534   BUN 14 06/08/2017 1534   CREATININE 0.90 06/08/2017 1534   CALCIUM 9.7 06/08/2017 1534   PROT 7.1 06/23/2017 1428   ALBUMIN 4.2 06/23/2017 1428   AST 30 06/23/2017 1428   ALT 32 06/23/2017 1428   ALKPHOS 76 06/23/2017 1428   BILITOT 0.4 06/23/2017 1428   GFRNONAA 80 (L) 05/07/2013 1525   GFRAA >90 05/07/2013 1525   Lab Results  Component Value  Date   CHOL 178 06/08/2017   HDL 57 06/08/2017   LDLCALC 106 (H) 06/08/2017   TRIG 64 06/08/2017   CHOLHDL 3.1 06/08/2017   Lab Results  Component Value Date   HGBA1C 5.7 10/18/2011   Lab Results  Component Value Date   EBRAXENM07 680 01/16/2010   Lab Results  Component Value Date   TSH 0.97 12/05/2017    11/13/08 LP - opening pressure 7cm H2O; WBC 1, RBC 0, glucose 62, protein 26, OCB (2 is CSF, not seen in serum), IgG index 0.5 (normal), lyme PCR neg, EBV PCR neg  11/13/08 VEP - normal  11/08/08 MRI cervical - normal  11/08/08 MRI brain (with and without contrast) - multiple supratentorial, periventricular and juxtacortical white matter lesions which may represent chronic demyelinating plaques or perivascular gliosis.  No abnormal enhancement on postcontrast views.   08/28/17 MRI brain  1.  Scattered T2/FLAIR hyperintense foci predominantly in the deep and subcortical white matter.  This is a nonspecific finding and most likely represents chronic microvascular ischemic change.  The pattern is not typical for demyelination.  The foci are not acute and they do not enhance after contrast.  When compared to the MRI dated 05/20/2004, there has been only slight progression in the number or size of the foci. 2.  There is a normal enhancement pattern and there are no acute findings.     ASSESSMENT  AND PLAN  62 y.o. year old female here with mixed tension and migraine headaches, left occipital neuralgia, and longer standing significant depression/anxiety. Some headaches have occipital neuralgia type features. Symptoms seem to be worse with stress and external factors.   Tried gabapentin with some benefit, but then was having more HA so switched to propranolol.    Dx:  Migraine without aura and without status migrainosus, not intractable  Tension headache    PLAN:  MIGRAINE WITHOUT AURA (worsening; stress related) - continue propranolol 64m twice a day  - continue aleve and  tylenol as needed migraine - continue exercise, activity, nutrition strategies  MIGRATORY PARESTHESIAS (? shingles vs other cause) - may increase gabapentin; currently 1066mtwice a day for burning pain; may increase up to 30073mwice a day   Meds ordered this encounter  Medications  . propranolol (INDERAL) 40 MG tablet    Sig: Take 1 tablet (40 mg total) by mouth 2 (two) times daily.    Dispense:  180 tablet    Refill:  4  . gabapentin (NEURONTIN) 100 MG capsule    Sig: Take 1-3 capsules (100-300 mg total) by mouth 2 (two) times daily.    Dispense:  180 capsule    Refill:  12   Return in about 9 months (around 10/31/2018).     VIKPenni BombardD 11/88/07/275:54:12 Certified in Neurology, Neurophysiology and Neuroimaging  [facility] [id number]uiMayhilleLivingstonC [phone]

## 2018-01-31 DIAGNOSIS — F3181 Bipolar II disorder: Secondary | ICD-10-CM | POA: Diagnosis not present

## 2018-01-31 DIAGNOSIS — F411 Generalized anxiety disorder: Secondary | ICD-10-CM | POA: Diagnosis not present

## 2018-02-07 ENCOUNTER — Telehealth: Payer: Self-pay | Admitting: Family Medicine

## 2018-02-07 MED ORDER — ZOSTER VAC RECOMB ADJUVANTED 50 MCG/0.5ML IM SUSR: 0.5000 mL | Freq: Once | INTRAMUSCULAR | 1 refills | Status: AC

## 2018-02-07 NOTE — Telephone Encounter (Signed)
Copied from [place] [id number]. Topic: Quick Communication - See Telephone Encounter >> Feb 07, 2018 11:15 AM Ahmed Prima L wrote: CRM for notification. See Telephone encounter for: 02/07/18.  Patient is calling to see if she needs to have the second shingle vaccine. She said she had the first one but had it done at [facility]. She said she thinks it was in the last 2 years. Patient would like the nurse to call her back

## 2018-02-07 NOTE — Telephone Encounter (Signed)
Please advise 

## 2018-02-07 NOTE — Telephone Encounter (Signed)
Pt made aware and rx was sent to pharmacy.

## 2018-02-07 NOTE — Telephone Encounter (Signed)
I do recommend the Shingrix vaccine (new shingles shot).  This is a series of 2 shots that will need to be given at the pharmacy based on insurance

## 2018-02-07 NOTE — Telephone Encounter (Signed)
Patient called back to say that she had the shingles vaccine at [facility] in [place] in August of 2016.

## 2018-02-08 ENCOUNTER — Other Ambulatory Visit: Payer: Self-pay | Admitting: Family Medicine

## 2018-02-10 ENCOUNTER — Encounter: Payer: Self-pay | Admitting: Sports Medicine

## 2018-02-10 ENCOUNTER — Ambulatory Visit (INDEPENDENT_AMBULATORY_CARE_PROVIDER_SITE_OTHER): Payer: Medicare Other | Admitting: Sports Medicine

## 2018-02-10 VITALS — BP 132/70 | HR 42 | Ht 61.0 in | Wt 110.6 lb

## 2018-02-10 DIAGNOSIS — M9908 Segmental and somatic dysfunction of rib cage: Secondary | ICD-10-CM | POA: Diagnosis not present

## 2018-02-10 DIAGNOSIS — M9903 Segmental and somatic dysfunction of lumbar region: Secondary | ICD-10-CM

## 2018-02-10 DIAGNOSIS — G8929 Other chronic pain: Secondary | ICD-10-CM | POA: Diagnosis not present

## 2018-02-10 DIAGNOSIS — M9901 Segmental and somatic dysfunction of cervical region: Secondary | ICD-10-CM

## 2018-02-10 DIAGNOSIS — M25511 Pain in right shoulder: Secondary | ICD-10-CM

## 2018-02-10 DIAGNOSIS — M9905 Segmental and somatic dysfunction of pelvic region: Secondary | ICD-10-CM | POA: Diagnosis not present

## 2018-02-10 DIAGNOSIS — M9902 Segmental and somatic dysfunction of thoracic region: Secondary | ICD-10-CM | POA: Diagnosis not present

## 2018-02-10 DIAGNOSIS — M9907 Segmental and somatic dysfunction of upper extremity: Secondary | ICD-10-CM | POA: Diagnosis not present

## 2018-02-10 DIAGNOSIS — M9904 Segmental and somatic dysfunction of sacral region: Secondary | ICD-10-CM

## 2018-02-10 NOTE — Progress Notes (Signed)
Theresa Martin. Theresa Martin, [place] at [facility] [phone]  Theresa Martin - 62 y.o. female MRN [id number]  Date of birth: [birth year]  Visit Date: 02/10/2018  PCP: Theresa Martin   Referred by: Theresa Martin  Scribe(s) for today's visit: Theresa Martin  SUBJECTIVE:  Theresa Martin "Theresa Martin" is here for Follow-up (R shoulder pain) .    HPI 03/30/17: Compared to the last office visit on 02/25/17, her previously described R shoulder symptoms are improving w/ much less pain noted after her last visit. Current symptoms are mild & are nonradiating and located mainly to her R scapular region. She has been taking IBU prn.  She has not gone back to PT since her last visit w/ Theresa Martin in late November 2018.  06/01/17: Compared to the last office visit, her previously described symptoms are improving. Still has some pain when lifting heavy objects. She has started lifting light weights at the gym to strengthen her shoulder.  Current symptoms are mild & are nonradiating She has been taking Advil or Meloxicam prn for the pain with some relief. She feels that she did benefit substantially from OMT at last visit.   07/27/17: Compared to the last office visit on 06/01/17, her previously described R shoulder pain symptoms show no change.  She states that, in general, her R shoulder is improving but notes that it flared up after taking care of her small granddaughter for the past few days. Current symptoms are moderate & are nonradiating She has been taking Advil or Meloxicam prn for pain.  She had OMT at her last visit and feels that this treatment has been helping w/ her symptoms.  09/21/2017: Compared to the last office visit, her previously described symptoms are worsening. She has increased pain when picking up her grandchild. She has pain whenever she tried to do anything strenuous with her R side.  Current symptoms are  moderate & are nonradiating. Pain imrpoves when she props her arm up as opposed to having it by her side.  She has been taking Advil or Meloxicam prn for pain with some relief. She received OMT in the past and tolerated well. She has been doing HEP and [employer]. She reports that she has been unable to access some of the exercises (probably goodman 12 min video) on her phone.   10/21/2017: Compared to the last office visit, her previously described symptoms are worsening, she vacuumed this past Tuesday and that caused pain to flare up. She started having n/t in her arms and fingers again. She has been having trouble sleeping d/t pain, seems to happen after being more active. She is now having pain on the L side of her neck and into the L shoulder.  Current symptoms are moderate & are radiating to both arms.  She has been taking Advil or Meloxicam with some relief. She responded well to OMT at her last visit. She hasn't been doing HEP regularly.   11/21/2017: Compared to the last office visit on 10/21/17, her previously described R shoulder pain symptoms are improving  Current symptoms are mild & are radiating to R UE w/ additional pain noted in her L UE. She has been taking Meloxicam and Advil prn.  She's had OMT at her last few visits and has responded well to these treatments.  She is doing her HEP daily. She states that she is also having some pain today in her  R lower back which occurred when she was bending down to pick something up off the ground.  12/19/2017: Compared to the last office visit, her previously described symptoms are improving. She reports 1 flare-up since her last visit.  Current symptoms are mild & are radiating to both arms to the fingers.  She has been doing HEP with no trouble. She takes Advil or Aleve prn (for HA). She has d/c Meloxicam but has it if needed.  Pt reports no significant changes since her last OV.  01/16/2018: Compared to the last office visit, her  previously described symptoms are improving. She still has pain when trying to reach back and when lifting.  Current symptoms are mild & are radiating to both arms to the fingers. She reports no change to medication regimen or other modalities for shoulder pain.  02/10/2018: Compared to the last office visit on 01/16/18, her previously described R shoulder symptoms are improving.  However, she is currently having issues w/ the R side of her neck x one week.  She reports noticing the pain one morning when she woke up and her symptoms have gotten worse since.  She states that she is not having an associated increase in HA due to her neck pain. Current symptoms are moderate and tight in nature & are radiating to the R side of her neck and head. She has been taking Advil or Aleve prn.  She has received several OMT treatments and feels that these have been beneficial.   REVIEW OF SYSTEMS: Reports night time disturbances. Denies fevers, chills, or night sweats. Denies unexplained weight loss. Denies personal history of cancer. Denies changes in bowel or bladder habits. Denies recent unreported falls. Denies new or worsening dyspnea or wheezing. Reports headaches or dizziness.  Reports numbness, tingling or weakness  In the extremities - BUEs Denies dizziness or presyncopal episodes Denies lower extremity edema   HISTORY & PERTINENT PRIOR DATA:  Significant/pertinent history, findings, studies include:  reports that she has never smoked. She has never used smokeless tobacco. No results for input(s): HGBA1C, LABURIC, CREATINE in the last 8760 hours. Pre-Visit Info 05/30/2015 9:51 AM  Medication: Reviewed & UTD with the patient.  Preferred Pharmacy and which med where: [id number] - [address], [place]   Allergies verified: NKA  Immunization Status: Flu vaccine-- Declines Tdap-- 07/27/12 PNA-- NA Shingles-- 09/26/14  A/P:   Changes to [place], PSH or Personal Hx:  UTD Pap-- 09/27/14 w/ Theresa Martin at [facility]; normal; patient reported MMG-- 09/27/14 at [facility]; normal; pt. reported Bone Density-- per patient, it has not been completed. CCS-- 01/21/10 w/ Dr. Verl Blalock at [facility]; no polyps or cancers; otherwise normal; follow-up in 10 years.  Care Teams Updated:  Theresa Martin - Gynecology  Dr. Andrey Spearman - Neurology  ED/Hospital/Urgent Care Visits: Per the patient, no recent visits to the ED/Hospital or Urgent Care.  To Discuss with Provider: No concerns at the time of call. No problems updated.  Otherwise prior history reviewed and updated per electronic medical record.   OBJECTIVE:  VS:  HT:5\' 1"  (154.9 cm)   WT:110 lb 9.6 oz (50.2 kg)  BMI:20.91    BP:132/70  HR:(!) 42bpm  TEMP: ( )  RESP:98 %   PHYSICAL EXAM: CONSTITUTIONAL: Well-developed, Well-nourished and In no acute distress Psychiatric: Alert & appropriately interactive. and Not depressed or anxious appearing. RESPIRATORY: No increased work of breathing and Trachea Midline EYES: Pupils are equal., EOM  intact without nystagmus. and No scleral icterus.  Upper and Lower extremities: EXTREMITY EXAM: Warm and well perfused NEURO: unremarkable Normal associated myotomal distribution strength to manual muscle testing Normal sensation to light touch  MSK Exam: Neck and back: . Well aligned, no significant deformity. . No overlying skin changes. . No focal bony tenderness . TTP over Right greater than left paraspinal muscles.  Right omohyoid restriction.  Markedly tight pectoralis major muscles on the right with limited external rotation of the shoulder. . Range of motion of the cervical, and thoracic spine is limited with sidebending and rotation.  She is slightly rotated to the left on the thoracic spine.  . Normal, non-painful: Lhermitte's compression test, Spurling's compression test, arm squeeze test and brachial plexus squeeze.    ASSESSMENT   1. Chronic right shoulder pain   2. Somatic dysfunction of cervical region   3. Somatic dysfunction of thoracic region   4. Somatic dysfunction of rib cage region   5. Somatic dysfunction of lumbar region   6. Somatic dysfunction of pelvis region   7. Somatic dysfunction of sacral region   8. Somatic dysfunction of upper extremity   9. Trigger point of right shoulder region     PLAN:  Pertinent additional documentation may be included in corresponding procedure notes, imaging studies, problem based documentation and patient instructions.  Procedures:  . Landmark Guided injection performed per procedure note . Osteopathic manipulation was performed today based on physical exam findings.  Please see procedure note for further information including Osteopathic Exam findings  Medications:  No orders of the defined types were placed in this encounter.  Discussion/Instructions: No problem-specific Assessment & Plan notes found for this encounter.  Marland Kitchen Responding well to OMT and HEP.  She did have a trigger point in her right periscapular region following manipulation today and this was alleviated with direct injection of the trigger point in her periscapular region. . Levator scapula trigger point left trapezius trigger point injected to the . Continue previously prescribed home exercise program.  . Discussed red flag symptoms that warrant earlier emergent evaluation and patient voices understanding. . Activity modifications and the importance of avoiding exacerbating activities (limiting pain to no more than a 4 / 10 during or following activity) recommended and discussed.  Follow-up:  . Return in about 2 weeks (around 02/24/2018) for consideration of repeat osteopathic manipulation.   . If any lack of improvement consider: consider further diagnostic evaluation with Further cervical spine evaluation or MRI of the shoulder.  . At follow up will plan to consider: to  consider repeat osteopathic manipulation     CMA/Martin served as scribe during this visit. History, Physical, and Plan performed by medical provider. Documentation and orders reviewed and attested to.      Gerda Diss, [facility] Physician

## 2018-02-10 NOTE — Patient Instructions (Signed)
You had an injection today.  Things to be aware of after injection are listed below: . You may experience no significant improvement or even a slight worsening in your symptoms during the first 24 to 48 hours.  After that we expect your symptoms to improve gradually over the next 2 weeks for the medicine to have its maximal effect.  You should continue to have improvement out to 6 weeks after your injection. . Dr. Maesyn Frisinger recommends icing the site of the injection for 20 minutes  1-2 times the day of your injection . You may shower but no swimming, tub bath or Jacuzzi for 24 hours. . If your bandage falls off this does not need to be replaced.  It is appropriate to remove the bandage after 4 hours. . You may resume light activities as tolerated unless otherwise directed per Dr. Kirill Chatterjee during your visit  POSSIBLE STEROID SIDE EFFECTS:  Side effects from injectable steroids tend to be less than when taken orally however you may experience some of the symptoms listed below.  If experienced these should only last for a short period of time. Change in menstrual flow  Edema (swelling)  Increased appetite Skin flushing (redness)  Skin rash/acne  Thrush (oral) Yeast vaginitis    Increased sweating  Depression Increased blood glucose levels Cramping and leg/calf  Euphoria (feeling happy)  POSSIBLE PROCEDURE SIDE EFFECTS: The side effects of the injection are usually fairly minimal however if you may experience some of the following side effects that are usually self-limited and will is off on their own.  If you are concerned please feel free to call the office with questions:  Increased numbness or tingling  Nausea or vomiting  Swelling or bruising at the injection site   Please call our office if if you experience any of the following symptoms over the next week as these can be signs of infection:   Fever greater than 100.5F  Significant swelling at the injection site  Significant redness or drainage  from the injection site  If after 2 weeks you are continuing to have worsening symptoms please call our office to discuss what the next appropriate actions should be including the potential for a return office visit or other diagnostic testing.    

## 2018-02-10 NOTE — Progress Notes (Signed)
PROCEDURE NOTE:  Landmark Guided: Injection: Right peri-scapular trigger point  DESCRIPTION OF PROCEDURE:  The patient's clinical condition is marked by substantial pain and/or significant functional disability. Other conservative therapy has not provided relief, is contraindicated, or not appropriate. There is a reasonable likelihood that injection will significantly improve the patient's pain and/or functional impairment.   After discussing the risks, benefits and expected outcomes of the injection and all questions were reviewed and answered, the patient wished to undergo the above named procedure.  Verbal consent was obtained. The skin was then prepped in sterile fashion and the target structure was injected as below:  Single injection performed as below:  PREP: Alcohol and Ethel Chloride APPROACH: direct, single injection, 25g 1.5 in. - twitch response elicited INJECTATE: 1 cc 0.5% Marcaine and 1 cc 40mg /mL DepoMedrol ASPIRATE: None DRESSING: Band-Aid  Post procedural instructions including recommending icing and warning signs for infection were reviewed.    This procedure was well tolerated and there were no complications.

## 2018-02-11 ENCOUNTER — Encounter: Payer: Self-pay | Admitting: Sports Medicine

## 2018-02-11 NOTE — Progress Notes (Signed)
PROCEDURE NOTE : OSTEOPATHIC MANIPULATION The decision today to treat with Osteopathic Manipulative Therapy (OMT) was based on physical exam findings. Verbal consent was obtained following a discussion with the patient regarding the of risks, benefits and potential side effects, including an acute pain flare,post manipulation soreness and need for repeat treatments.     Contraindications to OMT: NONE  Manipulation was performed as below: Regions Treated OMT Techniques Used  1. Cervical spine 2. Thoracic spine 3. Ribs 4. Lumbar spine 5. Pelvis 6. Sacrum 7. Upper extremities . HVLA . muscle energy . myofascial release   The patient tolerated the treatment well and reported Improved symptoms following treatment today. Patient was given medications, exercises, stretches and lifestyle modifications per AVS and verbally.   OSTEOPATHIC/STRUCTURAL EXAM:   OA - rotated right C4 Extended, rotated left, side bent right T2 FRS right (Flexed, Rotated & Sidebent) Rib 6 Left  Posterior L4 FRS right (Flexed, Rotated & Sidebent) Right psoas spasm Right anterior innonimate L on L sacral torsion Right pec minor tenderpoint Right supraspinatous tenderpoint Right trapezius tenderpoint

## 2018-02-13 ENCOUNTER — Ambulatory Visit: Payer: Medicare Other | Admitting: Sports Medicine

## 2018-02-14 ENCOUNTER — Ambulatory Visit: Payer: Medicare Other | Admitting: Sports Medicine

## 2018-02-19 NOTE — Progress Notes (Signed)
Gynecology Annual Exam  PCP: [name], MD  Chief Complaint:  Chief Complaint  Patient presents with  . Gynecologic Exam    History of Present Illness:Theresa Martin presents today for her annual exam. She is a 62 year old Caucasian/White female , G 3 P 2 0 1 2 , who is postmenopausal . She is having no significant GYN problems.   She has had no spotting.   The patient's past medical history is notable for a history of depression, anxiety, migraine without aura,  post herpetic neuralgia, hypertension, and hyperlipidemia.  Since her last annual GYN exam dated 02/18/2016, she had injured her right shoulder when she picked up the car seat with her grandchild in it to lift into the car. Slowly improved with PT, and is back to normal.  She is not sexually active. She has been sexually active in the past but not currently.   Her most recent pap smear was obtained 02/18/2016 and was NIL.   Her most recent mammogram obtained on 01/27/2018 was normal. There is no family history of breast cancer. There is no family history of ovarian cancer. The patient does not do monthly self breast exams.  She had a colonoscopy in 2014/2015 that was normal. Her next colonoscopy is due in 10 years.  She had a recent DEXA scan obtained in 2019 ( ordered by Dr Birdie Riddle / [facility] in [place]) that showed osteopenia. T score of femur neck  was -1.8 and FRAX scores were 17%/1.2%.  The patient does not smoke.  The patient does not drink alcohol.  The patient does not use illegal drugs.  The patient exercises regularly. (spin every day) The patient may not get adequate calcium in her diet (soy milk, yogurt, green leafy). She does take calcium and vitamin D3 supplements She had a recent cholesterol screen in 2019 that was normal on her LIpitor.     Review of Systems: Review of Systems  Constitutional: Negative for chills, fever and weight loss.  HENT: Negative for congestion, sinus pain  and sore throat.   Eyes: Negative for blurred vision and pain.  Respiratory: Negative for hemoptysis, shortness of breath and wheezing.   Cardiovascular: Positive for palpitations. Negative for chest pain and leg swelling.  Gastrointestinal: Positive for constipation. Negative for abdominal pain, blood in stool, diarrhea, heartburn, nausea and vomiting.  Genitourinary: Negative for dysuria, frequency, hematuria and urgency.  Musculoskeletal: Positive for neck pain. Negative for back pain, joint pain and myalgias.  Skin: Negative for itching and rash.  Neurological: Positive for tingling and headaches. Negative for dizziness.  Endo/Heme/Allergies: Positive for environmental allergies. Negative for polydipsia. Does not bruise/bleed easily.       Negative for hirsutism; positive for hot flashes   Psychiatric/Behavioral: Positive for depression. The patient is nervous/anxious and has insomnia.     Past Medical History:  Past Medical History:  Diagnosis Date  . Allergy   . Anxiety   . Depression   . Hyperlipidemia   . Hypertension   . Kidney stones   . Persistent headaches   . Pneumonia   . Postherpetic neuralgia   . Shingles outbreak 02/2014  . Skin cancer    moles on right groin and right buttock.    Past Surgical History:  Past Surgical History:  Procedure Laterality Date  . CARDIOVASCULAR STRESS TEST  02/2009   treadmill stress test: Low risk  . [place]  . COLONOSCOPY  2014/2015 [place], normal  . EXPLORATORY LAPAROTOMY     laproscopy for infertility  . EYE SURGERY Bilateral    laser-correct opening b/tn cornea and iris  . MOLE REMOVAL  2017   x 2 moles    Family History:  Family History  Problem Relation Age of Onset  . Hypertension Mother   . Coronary artery disease Mother   . Heart failure Mother   . Hypertension Father   . Liver disease Sister        liver failure  . Arrhythmia Brother   . Hypertension Brother   . Cancer Neg Hx         colon   . Breast cancer Neg Hx     Social History:  Social History   Socioeconomic History  . Marital status: Legally Separated    Spouse name: Not on file  . Number of children: 2  . Years of education: Not on file  . Highest education level: Not on file  Occupational History  . Occupation: Control and instrumentation engineer  Social Needs  . Financial resource strain: Not on file  . Food insecurity:    Worry: Not on file    Inability: Not on file  . Transportation needs:    Medical: Not on file    Non-medical: Not on file  Tobacco Use  . Smoking status: Never Smoker  . Smokeless tobacco: Never Used  Substance and Sexual Activity  . Alcohol use: No    Alcohol/week: 0.0 standard drinks    Comment: rare  . Drug use: No  . Sexual activity: Not Currently  Lifestyle  . Physical activity:    Days per week: Not on file    Minutes per session: Not on file  . Stress: Not on file  Relationships  . Social connections:    Talks on phone: Not on file    Gets together: Not on file    Attends religious service: Not on file    Active member of club or organization: Not on file    Attends meetings of clubs or organizations: Not on file    Relationship status: Not on file  . Intimate partner violence:    Fear of current or ex partner: Not on file    Emotionally abused: Not on file    Physically abused: Not on file    Forced sexual activity: Not on file  Other Topics Concern  . Not on file  Social History Narrative   Regular exercise- yes, daily running, elliptical    Diet: fruits and veggies, water    No caffeine    Allergies:  No Known Allergies  Medications: Prior to Admission medications   Medication Sig Start Date End Date Taking? Authorizing Provider  albuterol (PROVENTIL HFA;VENTOLIN HFA) 108 (90 Base) MCG/ACT inhaler Inhale 2 puffs into the lungs every 6 (six) hours as needed for wheezing or shortness of breath. 03/21/17   [name], MD  atorvastatin (LIPITOR) 20  MG tablet TAKE 1 TABLET BY MOUTH EVERY DAY 08/08/17   [name], MD  Calcium-Vitamin D-Vitamin K (CALCIUM + D + K PO) Take 3 each by mouth daily. Calcium 574m- Vitamin D 1000iu- K 477m    [provider]  clonazePAM (KLONOPIN) 0.5 MG tablet Take 0.25 mg by mouth at bedtime as needed.  01/12/14   [provider]  escitalopram (LEXAPRO) 10 MG tablet Take 10 mg by mouth daily.     [provider]  Fluocinolone-Emollient (FLUOCINOLONE CREAM &  EMOLLIENT EX) Apply topically.    [provider]  gabapentin (NEURONTIN) 100 MG capsule Take 1-3 capsules (100-300 mg total) by mouth 2 (two) times daily. 01/30/18   Penumalli, Earlean Polka, MD  Glucosamine-Chondroitin (MOVE FREE PO) Take 1 tablet by mouth daily.    [provider]  ibuprofen (ADVIL,MOTRIN) 200 MG tablet Take 200 mg by mouth every 6 (six) hours as needed.    [provider]  Ketoconazole-Hydrocortisone 2 & 1 % KIT Apply topically as needed. Cracks on mouth    [provider]  lamoTRIgine (LAMICTAL) 100 MG tablet TAKE 144m in the morning and 200 mg by mouth before bed.    [provider]  Melatonin 3 MG CAPS Take 1 capsule by mouth at bedtime.    [provider]  meloxicam (MOBIC) 15 MG tablet TAKE 1 TABLET BY MOUTH EVERY DAY 01/31/17   TMidge Minium MD  Naproxen Sodium (ALEVE) 220 MG CAPS As needed for headache 04/15/15   Penumalli, VEarlean Polka MD  propranolol (INDERAL) 40 MG tablet Take 1 tablet (40 mg total) by mouth 2 (two) times daily. 01/30/18   Penumalli, VEarlean Polka MD  QUEtiapine (SEROQUEL XR) 50 MG TB24 24 hr tablet Take 100 mg by mouth at bedtime.     [provider]  traZODone (DESYREL) 50 MG tablet Take 50 mg by mouth at bedtime as needed for sleep.  05/29/15   [provider]  triamterene-hydrochlorothiazide (MAXZIDE-25) 37.5-25 MG tablet TAKE 1 TABLET BY MOUTH EVERY DAY 02/08/18   TMidge Minium MD    Physical Exam Vitals:  BP 110/60   Ht 5' 1"  (1.549 m)   Wt 110 lb (49.9 kg)   BMI 20.78 kg/m   General: WF in NAD HEENT: normocephalic, anicteric Neck: no thyroid enlargement, no palpable nodules, no cervical lymphadenopathy  Pulmonary: No increased work of breathing, some scattered wheezes in right lower lobe Cardiovascular: RRR, without murmur  Breast: Breast symmetrical, no tenderness, no palpable nodules or masses, no skin or nipple retraction present, no nipple discharge.  No axillary, infraclavicular or supraclavicular lymphadenopathy. Abdomen: Soft, non-tender, non-distended.  Umbilicus without lesions.  No hepatomegaly or masses palpable. No evidence of hernia. Genitourinary:  External: Atrophic changes.  Normal urethral meatus, normal Bartholin's and Skene's glands.    Vagina: pale flattened walls, atrophic vaginal opening, no evidence of prolapse.    Cervix: Grossly normal in appearance, no bleeding, non-tender, deviated to the left  Uterus: Anteverted, normal size, shape, and consistency, mobile, and non-tender One digit pelvic exam  Adnexa: No adnexal masses, non-tender  Rectal: deferred  Lymphatic: no evidence of inguinal lymphadenopathy Extremities: no edema, erythema, or tenderness Neurologic: Grossly intact Psychiatric: mood appropriate, affect full     Assessment: 62y.o. [id number] atrophic changes due to menopause   Plan:   1) Breast cancer screening - recommend monthly self breast exam. Mammogram is up to date.  2) Osteoporosis prevention: discussed calcium and vitamin D3 requirements and the role of weight bearing exercise in preventing osteoporosis  3) Cervical cancer screening - Pap was done. ASCCP guidelines and rational discussed.  Patient opts for every 2 years screening interval  4) Colonoscopy-next due in 2024  5) Routine healthcare maintenance including cholesterol and diabetes screening managed by PCP   6) RTO in 2 years  CDalia Heading  [place]

## 2018-02-20 ENCOUNTER — Other Ambulatory Visit (HOSPITAL_COMMUNITY)
Admission: RE | Admit: 2018-02-20 | Discharge: 2018-02-20 | Disposition: A | Discharge status: Home / Self Care | Payer: Medicare Other | Source: Ambulatory Visit | Attending: Certified Nurse Midwife | Admitting: Certified Nurse Midwife

## 2018-02-20 ENCOUNTER — Ambulatory Visit (INDEPENDENT_AMBULATORY_CARE_PROVIDER_SITE_OTHER): Payer: Medicare Other | Admitting: Certified Nurse Midwife

## 2018-02-20 ENCOUNTER — Encounter: Payer: Self-pay | Admitting: Certified Nurse Midwife

## 2018-02-20 VITALS — BP 110/60 | Ht 61.0 in | Wt 110.0 lb

## 2018-02-20 DIAGNOSIS — Z124 Encounter for screening for malignant neoplasm of cervix: Secondary | ICD-10-CM | POA: Diagnosis not present

## 2018-02-20 DIAGNOSIS — Z01419 Encounter for gynecological examination (general) (routine) without abnormal findings: Secondary | ICD-10-CM

## 2018-02-20 DIAGNOSIS — M810 Age-related osteoporosis without current pathological fracture: Secondary | ICD-10-CM

## 2018-02-20 DIAGNOSIS — Z780 Asymptomatic menopausal state: Secondary | ICD-10-CM

## 2018-02-20 NOTE — Patient Instructions (Signed)
Try to get in 1000 mgm of calcium/day between your diet and supplements You also need [phone] IU of vitamin D3 daily

## 2018-02-21 ENCOUNTER — Encounter: Payer: Self-pay | Admitting: Certified Nurse Midwife

## 2018-02-21 DIAGNOSIS — C4490 Unspecified malignant neoplasm of skin, unspecified: Secondary | ICD-10-CM | POA: Insufficient documentation

## 2018-02-21 DIAGNOSIS — Z780 Asymptomatic menopausal state: Secondary | ICD-10-CM | POA: Insufficient documentation

## 2018-02-21 DIAGNOSIS — M810 Age-related osteoporosis without current pathological fracture: Secondary | ICD-10-CM

## 2018-02-21 DIAGNOSIS — M8580 Other specified disorders of bone density and structure, unspecified site: Secondary | ICD-10-CM | POA: Insufficient documentation

## 2018-02-21 LAB — CYTOLOGY - PAP: Diagnosis: NEGATIVE

## 2018-02-27 ENCOUNTER — Ambulatory Visit: Payer: Medicare Other | Admitting: Sports Medicine

## 2018-03-01 ENCOUNTER — Telehealth: Payer: Self-pay

## 2018-03-01 NOTE — Telephone Encounter (Signed)
This information was given to Practice Administrator and forwarded to the correct department that is allowed to make an address change.

## 2018-03-01 NOTE — Telephone Encounter (Signed)
Didn't you already call about this?     Copied from [place] [phone]. Topic: General - Other >> Feb 07, 2018  9:14 AM Janace Aris A wrote: Reason for CRM: Pt called in wanting to advise someone here at the office that we would  need to update the address information that we have for Dr.Tabori though [place] . She says she is trying to do her open enrollment and it's not allowing her to add this provider because the information is not updated.   The number the pt gave to address this is;  [phone]  Please advise >> Feb 07, 2018 10:00 AM Katina Dung, CMA wrote: Form from [place] has been printed and scanned to send to credentialing for updating address. >> Mar 01, 2018  9:55 AM Marin Olp L wrote: Patient would like an update from credentialing to get the status on correcting the address

## 2018-03-02 DIAGNOSIS — F3181 Bipolar II disorder: Secondary | ICD-10-CM | POA: Diagnosis not present

## 2018-03-02 DIAGNOSIS — F411 Generalized anxiety disorder: Secondary | ICD-10-CM | POA: Diagnosis not present

## 2018-03-03 ENCOUNTER — Encounter: Payer: Self-pay | Admitting: Sports Medicine

## 2018-03-03 ENCOUNTER — Ambulatory Visit: Payer: Medicare Other | Admitting: Sports Medicine

## 2018-03-03 ENCOUNTER — Ambulatory Visit (INDEPENDENT_AMBULATORY_CARE_PROVIDER_SITE_OTHER): Payer: Medicare Other | Admitting: Sports Medicine

## 2018-03-03 VITALS — BP 110/70 | HR 52 | Ht 61.0 in | Wt 110.4 lb

## 2018-03-03 DIAGNOSIS — M9902 Segmental and somatic dysfunction of thoracic region: Secondary | ICD-10-CM

## 2018-03-03 DIAGNOSIS — M9901 Segmental and somatic dysfunction of cervical region: Secondary | ICD-10-CM

## 2018-03-03 DIAGNOSIS — M9908 Segmental and somatic dysfunction of rib cage: Secondary | ICD-10-CM | POA: Diagnosis not present

## 2018-03-03 DIAGNOSIS — M25511 Pain in right shoulder: Secondary | ICD-10-CM | POA: Diagnosis not present

## 2018-03-03 DIAGNOSIS — G8929 Other chronic pain: Secondary | ICD-10-CM | POA: Diagnosis not present

## 2018-03-03 NOTE — Telephone Encounter (Signed)
Insurance companies are still in the process of loading our providers to the 2020 plans which may cause some temporary confusion. Please allow time for the insurance companies to complete this process. So far, we know of delays with [place], and [employer]. As long as the provider is currently in network with these plans for 2019 there should be no issues with 2020 once the insurance companies have completely loaded the providers. If there are any major changes with any plans, the contracting department would send a communication out detailing those changes. Unfortunately, there is nothing the payor enrollment department can do to speed up the process of getting the providers loaded to 2020 plans since that is completed by each insurance company.

## 2018-03-06 NOTE — Telephone Encounter (Signed)
Spoke to pt on 12/6 advising her of what Estill Bamberg said, pt stated an understanding and said thank you.

## 2018-03-24 ENCOUNTER — Ambulatory Visit: Payer: Medicare Other | Admitting: Sports Medicine

## 2018-03-28 ENCOUNTER — Ambulatory Visit (INDEPENDENT_AMBULATORY_CARE_PROVIDER_SITE_OTHER): Payer: Medicare Other | Admitting: Family Medicine

## 2018-03-28 ENCOUNTER — Other Ambulatory Visit: Payer: Self-pay

## 2018-03-28 ENCOUNTER — Encounter: Payer: Self-pay | Admitting: Family Medicine

## 2018-03-28 VITALS — BP 121/81 | HR 54 | Temp 98.6°F | Resp 16 | Ht 61.0 in | Wt 109.0 lb

## 2018-03-28 DIAGNOSIS — B9689 Other specified bacterial agents as the cause of diseases classified elsewhere: Secondary | ICD-10-CM

## 2018-03-28 DIAGNOSIS — J329 Chronic sinusitis, unspecified: Secondary | ICD-10-CM | POA: Diagnosis not present

## 2018-03-28 MED ORDER — AMOXICILLIN 875 MG PO TABS: 875.0000 mg | ORAL_TABLET | Freq: Two times a day (BID) | ORAL | 0 refills | Status: DC

## 2018-03-28 NOTE — Progress Notes (Signed)
   Subjective:    Patient ID: Theresa Martin, female    DOB: Oct 09, 1955, 62 y.o.   MRN: [id number]  HPI 'I feel awful'- started w/ body aches 1 week ago.  Saturday developed nausea, diarrhea.  GI sxs improved Monday but body aches continued.  Tm 100.4 last night.  + sore throat since Thursday.  + strep contacts.  + sinus pressure- 'this morning i'm blowing out green mucous'.  R ear pain.  + cough- having to use inhaler.   Review of Systems For ROS see HPI     Objective:   Physical Exam Vitals signs reviewed.  Constitutional:      General: She is not in acute distress.    Appearance: She is well-developed.  HENT:     Head: Normocephalic and atraumatic.     Right Ear: Tympanic membrane normal.     Left Ear: Tympanic membrane normal.     Nose: Mucosal edema and rhinorrhea present.     Right Sinus: Maxillary sinus tenderness and frontal sinus tenderness present.     Left Sinus: Maxillary sinus tenderness and frontal sinus tenderness present.     Mouth/Throat:     Pharynx: Uvula midline. Posterior oropharyngeal erythema present. No oropharyngeal exudate.  Eyes:     Conjunctiva/sclera: Conjunctivae normal.     Pupils: Pupils are equal, round, and reactive to light.  Neck:     Musculoskeletal: Normal range of motion and neck supple.  Cardiovascular:     Rate and Rhythm: Normal rate and regular rhythm.     Heart sounds: Normal heart sounds.  Pulmonary:     Effort: Pulmonary effort is normal.     Breath sounds: Wheezing (faint inspiratory/expiratory wheezes) present.     Comments: Coarse BS, L>R Lymphadenopathy:     Cervical: No cervical adenopathy.           Assessment & Plan:  Bacterial sinusitis- new.  Given duration and 2nd sickening, suspect bacterial infxn.  Start abx.  Reviewed supportive care and red flags that should prompt return.  Pt expressed understanding and is in agreement w/ plan.

## 2018-03-28 NOTE — Patient Instructions (Signed)
Follow up as needed or as scheduled START the Amoxicillin twice daily- take w/ food Drink plenty of fluids REST! Mucinex DM to help w/ cough and chest congestion Continue your inhaler as needed Alternate tylenol/ibuprofen for pain/fever Call with any questions or concerns Hang in there! Happy New Year!

## 2018-03-29 DIAGNOSIS — J189 Pneumonia, unspecified organism: Secondary | ICD-10-CM

## 2018-03-29 HISTORY — DX: Pneumonia, unspecified organism: J18.9

## 2018-03-30 ENCOUNTER — Ambulatory Visit: Payer: Medicare Other | Admitting: Sports Medicine

## 2018-04-03 ENCOUNTER — Telehealth: Payer: Self-pay | Admitting: Podiatry

## 2018-04-03 ENCOUNTER — Encounter

## 2018-04-03 ENCOUNTER — Ambulatory Visit (INDEPENDENT_AMBULATORY_CARE_PROVIDER_SITE_OTHER): Payer: Medicare Other | Admitting: Podiatry

## 2018-04-03 ENCOUNTER — Encounter: Payer: Self-pay | Admitting: Podiatry

## 2018-04-03 ENCOUNTER — Ambulatory Visit (INDEPENDENT_AMBULATORY_CARE_PROVIDER_SITE_OTHER): Payer: Medicare Other

## 2018-04-03 VITALS — BP 127/83 | HR 61 | Resp 16

## 2018-04-03 DIAGNOSIS — G5761 Lesion of plantar nerve, right lower limb: Secondary | ICD-10-CM | POA: Diagnosis not present

## 2018-04-03 DIAGNOSIS — M779 Enthesopathy, unspecified: Secondary | ICD-10-CM

## 2018-04-03 DIAGNOSIS — L600 Ingrowing nail: Secondary | ICD-10-CM | POA: Diagnosis not present

## 2018-04-03 DIAGNOSIS — G5781 Other specified mononeuropathies of right lower limb: Secondary | ICD-10-CM

## 2018-04-03 DIAGNOSIS — M778 Other enthesopathies, not elsewhere classified: Secondary | ICD-10-CM

## 2018-04-03 MED ORDER — NEOMYCIN-POLYMYXIN-HC 1 % OT SOLN: OTIC | 1 refills | Status: DC

## 2018-04-03 NOTE — Patient Instructions (Signed)

## 2018-04-03 NOTE — Addendum Note (Signed)
Addended by: Graceann Congress D on: 04/03/2018 04:45 PM   Modules accepted: Orders

## 2018-04-03 NOTE — Telephone Encounter (Signed)
Pt was seen this morning and had ingrown toenail removed. Pt was under the impression she was getting a prescription but pharmacy has not received it yet. Please give pt a call.

## 2018-04-03 NOTE — Telephone Encounter (Signed)
Medication has been sent to pharmacy and patient has been notified

## 2018-04-03 NOTE — Progress Notes (Signed)
Subjective:  Patient ID: Theresa Martin, female    DOB: 1956/02/05,  MRN: [id number] HPI Chief Complaint  Patient presents with  . Toe Pain    Hallux right - lateral border, tender x months, intermittent, has pedicure every 5 weeks to trim out  . Foot Pain    Plantar forefoot right - "feels like I'm walking on a ball", closed shoes make worse  . New Patient (Initial Visit)    63 y.o. female presents with the above complaint.   ROS: Denies fever chills nausea vomiting muscle aches pains calf pain back pain chest pain shortness of breath.  Past Medical History:  Diagnosis Date  . Allergy   . Anxiety   . Depression   . Hyperlipidemia   . Hypertension   . Kidney stones   . Osteopenia after menopause   . Persistent headaches   . Pneumonia   . Postherpetic neuralgia   . Shingles outbreak 02/2014  . Skin cancer    moles on right groin and right buttock.   Past Surgical History:  Procedure Laterality Date  . CARDIOVASCULAR STRESS TEST  02/2009   treadmill stress test: Low risk  . [place]  . COLONOSCOPY     2014/2015 [place], normal  . EXPLORATORY LAPAROTOMY     laproscopy for infertility  . EYE SURGERY Bilateral    laser-correct opening b/tn cornea and iris  . MOLE REMOVAL  2017   x 2 moles    Current Outpatient Medications:  .  albuterol (PROVENTIL HFA;VENTOLIN HFA) 108 (90 Base) MCG/ACT inhaler, Inhale 2 puffs into the lungs every 6 (six) hours as needed for wheezing or shortness of breath., Disp: 1 Inhaler, Rfl: 2 .  amoxicillin (AMOXIL) 875 MG tablet, Take 1 tablet (875 mg total) by mouth 2 (two) times daily., Disp: 20 tablet, Rfl: 0 .  atorvastatin (LIPITOR) 20 MG tablet, TAKE 1 TABLET BY MOUTH EVERY DAY, Disp: 30 tablet, Rfl: 6 .  Calcium-Vitamin D-Vitamin K (CALCIUM + D + K PO), Take 3 each by mouth daily. Calcium 536m- Vitamin D 1000iu- K 426m, Disp: , Rfl:  .  clonazePAM (KLONOPIN) 0.5 MG tablet, Take 0.25 mg by mouth at bedtime as  needed. , Disp: , Rfl: 2 .  escitalopram (LEXAPRO) 10 MG tablet, Take 10 mg by mouth daily. , Disp: , Rfl:  .  Fluocinolone-Emollient (FLUOCINOLONE CREAM & EMOLLIENT EX), Apply topically., Disp: , Rfl:  .  gabapentin (NEURONTIN) 100 MG capsule, Take 1-3 capsules (100-300 mg total) by mouth 2 (two) times daily., Disp: 180 capsule, Rfl: 12 .  Glucosamine-Chondroitin (MOVE FREE PO), Take 1 tablet by mouth daily., Disp: , Rfl:  .  ibuprofen (ADVIL,MOTRIN) 200 MG tablet, Take 200 mg by mouth every 6 (six) hours as needed., Disp: , Rfl:  .  Ketoconazole-Hydrocortisone 2 & 1 % KIT, Apply topically as needed. Cracks on mouth, Disp: , Rfl:  .  lamoTRIgine (LAMICTAL) 100 MG tablet, TAKE 10023mn the morning and 200 mg by mouth before bed., Disp: , Rfl:  .  Melatonin 3 MG CAPS, Take 1 capsule by mouth at bedtime., Disp: , Rfl:  .  meloxicam (MOBIC) 15 MG tablet, TAKE 1 TABLET BY MOUTH EVERY DAY, Disp: 30 tablet, Rfl: 1 .  Naproxen Sodium (ALEVE) 220 MG CAPS, As needed for headache, Disp: 60 each, Rfl:  .  propranolol (INDERAL) 40 MG tablet, Take 1 tablet (40 mg total) by mouth 2 (two) times daily., Disp: 180 tablet, Rfl:  4 .  QUEtiapine (SEROQUEL XR) 50 MG TB24 24 hr tablet, Take 100 mg by mouth at bedtime. , Disp: , Rfl:  .  traZODone (DESYREL) 50 MG tablet, Take 50 mg by mouth at bedtime as needed for sleep. , Disp: , Rfl:  .  triamterene-hydrochlorothiazide (MAXZIDE-25) 37.5-25 MG tablet, TAKE 1 TABLET BY MOUTH EVERY DAY, Disp: 30 tablet, Rfl: 6  No Known Allergies Review of Systems Objective:   Vitals:   04/03/18 0943  BP: 127/83  Pulse: 61  Resp: 16    General: Well developed, nourished, in no acute distress, alert and oriented x3   Dermatological: Skin is warm, dry and supple bilateral. Nails x 10 are well maintained; remaining integument appears unremarkable at this time. There are no open sores, no preulcerative lesions, no rash or signs of infection present.  Sharp incurvated nail margin  along the tibiofibular border of the hallux right.  Mild erythema no purulence no malodor drainage.  Vascular: Dorsalis Pedis artery and Posterior Tibial artery pedal pulses are 2/4 bilateral with immedate capillary fill time. Pedal hair growth present. No varicosities and no lower extremity edema present bilateral.   Neruologic: Grossly intact via light touch bilateral. Vibratory intact via tuning fork bilateral. Protective threshold with Semmes Wienstein monofilament intact to all pedal sites bilateral. Patellar and Achilles deep tendon reflexes 2+ bilateral. No Babinski or clonus noted bilateral.  Palpable Mulder's click third interdigital space of the right foot.  Musculoskeletal: No gross boney pedal deformities bilateral. No pain, crepitus, or limitation noted with foot and ankle range of motion bilateral. Muscular strength 5/5 in all groups tested bilateral.  Gait: Unassisted, Nonantalgic.    Radiographs:  Radiographs taken today do not demonstrate any type of osseous abnormalities  Assessment & Plan:   Assessment: Neuroma third interdigital space right foot.  Ingrown toenail tibiofibular border of the hallux right.  Plan: Discussed etiology pathology and surgical therapies at this point injected the third interdigital space with 10 mg of Kenalog and 5 mg Marcaine.  Tolerated procedure well.  Also performed chemical matrixectomy today after local anesthesia was administered.  She tolerated procedure well without complications.  Prescription for Corticosporin otic as well as both oral and written with instructions were provided.  I will follow-up with her in 2 to 3 weeks just for reevaluation.     Crislyn Willbanks T. [place], [place]

## 2018-04-05 ENCOUNTER — Other Ambulatory Visit: Payer: Self-pay

## 2018-04-05 ENCOUNTER — Ambulatory Visit (INDEPENDENT_AMBULATORY_CARE_PROVIDER_SITE_OTHER): Payer: Medicare Other | Admitting: Physician Assistant

## 2018-04-05 ENCOUNTER — Ambulatory Visit: Payer: Self-pay | Admitting: *Deleted

## 2018-04-05 ENCOUNTER — Encounter: Payer: Self-pay | Admitting: Physician Assistant

## 2018-04-05 VITALS — BP 122/62 | HR 54 | Temp 98.8°F | Resp 14 | Ht 61.0 in | Wt 108.0 lb

## 2018-04-05 DIAGNOSIS — R110 Nausea: Secondary | ICD-10-CM

## 2018-04-05 MED ORDER — ONDANSETRON HCL 4 MG PO TABS: 4.0000 mg | ORAL_TABLET | Freq: Three times a day (TID) | ORAL | 0 refills | Status: DC | PRN

## 2018-04-05 NOTE — Telephone Encounter (Signed)
Pt called because she is having a lot of intestinal issues; on 03/25/18 she had the stomach flu;she says that she had gotten better  But her symptoms returned and she is still having issues; she had a BM on 03/25/18 and 03/26/28 (diarrhea); she received antibiotic on 03/28/18; she states that her only other BMs were on 04/01/2018 and 04/04/2018 ("mud texture"); she complains of bloating, poor apetitie, weakness, stomach feeling unsettled, and nausea; recommendations per nurse triage protocol; the pt would like to be seen in the office today by Dr Birdie Riddle but she has no availability; pt offered and accepted appointment with Elyn Aquas, [place], 04/05/2018 at 1500; she verbalized understanding; will route to office for notification of this upcoming appointment.    Reason for Disposition . Nausea lasts > 1 week  Answer Assessment - Initial Assessment Questions 1. NAUSEA SEVERITY: "How bad is the nausea?" (e.g., mild, moderate, severe; dehydration, weight loss)   - MILD: loss of appetite without change in eating habits   - MODERATE: decreased oral intake without significant weight loss, dehydration, or malnutrition   - SEVERE: inadequate caloric or fluid intake, significant weight loss, symptoms of dehydration     moderate 2. ONSET: "When did the nausea begin?"     03/25/18 3. VOMITING: "Any vomiting?" If so, ask: "How many times today?"     no 4. RECURRENT SYMPTOM: "Have you had nausea before?" If so, ask: "When was the last time?" "What happened that time?"  no 5. CAUSE: "What do you think is causing the nausea?"     Weak from whatever she had which had on 03/25/18 6. PREGNANCY: "Is there any chance you are pregnant?" (e.g., unprotected intercourse, missed birth control pill, broken condom)     no  Protocols used: NAUSEA-A-AH

## 2018-04-05 NOTE — Patient Instructions (Addendum)
Please stop the antibiotic. Start a daily probiotic -- Align, Digestive Advantage or Culturelle are some options.  Keep hydrated and start the diet below. Reintroduce regular diet as symptoms are improving. The zofran is to take as directed if needed for nausea.   Bland Diet A bland diet consists of foods that are often soft and do not have a lot of fat, fiber, or extra seasonings. Foods without fat, fiber, or seasoning are easier for the body to digest. They are also less likely to irritate your mouth, throat, stomach, and other parts of your digestive system. A bland diet is sometimes called a BRAT diet. What is my plan? Your health care provider or food and nutrition specialist (dietitian) may recommend specific changes to your diet to prevent symptoms or to treat your symptoms. These changes may include:  Eating small meals often.  Cooking food until it is soft enough to chew easily.  Chewing your food well.  Drinking fluids slowly.  Not eating foods that are very spicy, sour, or fatty.  Not eating citrus fruits, such as oranges and grapefruit. What do I need to know about this diet?  Eat a variety of foods from the bland diet food list.  Do not follow a bland diet longer than needed.  Ask your health care provider whether you should take vitamins or supplements. What foods can I eat? Grains  Hot cereals, such as cream of wheat. Rice. Bread, crackers, or tortillas made from refined white flour. Vegetables Canned or cooked vegetables. Mashed or boiled potatoes. Fruits  Bananas. Applesauce. Other types of cooked or canned fruit with the skin and seeds removed, such as canned peaches or pears. Meats and other proteins  Scrambled eggs. Creamy peanut butter or other nut butters. Lean, well-cooked meats, such as chicken or fish. Tofu. Soups or broths. Dairy Low-fat dairy products, such as milk, cottage cheese, or yogurt. Beverages  Water. Herbal tea. Apple juice. Fats and  oils Mild salad dressings. Canola or olive oil. Sweets and desserts Pudding. Custard. Fruit gelatin. Ice cream. The items listed above may not be a complete list of recommended foods and beverages. Contact a dietitian for more options. What foods are not recommended? Grains Whole grain breads and cereals. Vegetables Raw vegetables. Fruits Raw fruits, especially citrus, berries, or dried fruits. Dairy Whole fat dairy foods. Beverages Caffeinated drinks. Alcohol. Seasonings and condiments Strongly flavored seasonings or condiments. Hot sauce. Salsa. Other foods Spicy foods. Fried foods. Sour foods, such as pickled or fermented foods. Foods with high sugar content. Foods high in fiber. The items listed above may not be a complete list of foods and beverages to avoid. Contact a dietitian for more information. Summary  A bland diet consists of foods that are often soft and do not have a lot of fat, fiber, or extra seasonings.  Foods without fat, fiber, or seasoning are easier for the body to digest.  Check with your health care provider to see how long you should follow this diet plan. It is not meant to be followed for long periods. This information is not intended to replace advice given to you by your health care provider. Make sure you discuss any questions you have with your health care provider. Document Released: 07/07/2015 Document Revised: 04/13/2017 Document Reviewed: 04/13/2017 Elsevier Interactive Patient Education  2019 [employer].

## 2018-04-05 NOTE — Progress Notes (Signed)
Acute Office Visit  Subjective:    Patient ID: Theresa Martin, female    DOB: Apr 26, 1955, 63 y.o.   MRN: [id number]  Chief Complaint  Patient presents with  . Nausea    HPI  Patient presents to clinic today c/o 1 week of nausea, bloating, increased flatulence and mild constipation. Denies fever, chills, vomiting. Notes mild anorexia without change in diet. Denies melena, hematochezia or tenesmus. Denies heart burn or indigestion. Symptoms started after beginning amoxicillin for sinusitis. Notes sinus symptoms are completely resolved. Has 1 day left of ABX.  Past Medical History:  Diagnosis Date  . Allergy   . Anxiety   . Depression   . Hyperlipidemia   . Hypertension   . Kidney stones   . Osteopenia after menopause   . Persistent headaches   . Pneumonia   . Postherpetic neuralgia   . Shingles outbreak 02/2014  . Skin cancer    moles on right groin and right buttock.    Past Surgical History:  Procedure Laterality Date  . CARDIOVASCULAR STRESS TEST  02/2009   treadmill stress test: Low risk  . [place]  . COLONOSCOPY     2014/2015 [place], normal  . EXPLORATORY LAPAROTOMY     laproscopy for infertility  . EYE SURGERY Bilateral    laser-correct opening b/tn cornea and iris  . MOLE REMOVAL  2017   x 2 moles    Family History  Problem Relation Age of Onset  . Hypertension Mother   . Coronary artery disease Mother   . Heart failure Mother   . Hypertension Father   . Liver disease Sister        liver failure  . Arrhythmia Brother   . Hypertension Brother   . Cancer Neg Hx        colon   . Breast cancer Neg Hx     Social History   Socioeconomic History  . Marital status: Legally Separated    Spouse name: Not on file  . Number of children: 2  . Years of education: Not on file  . Highest education level: Not on file  Occupational History  . Occupation: Control and instrumentation engineer  Social Needs  . Financial resource strain: Not on file   . Food insecurity:    Worry: Not on file    Inability: Not on file  . Transportation needs:    Medical: Not on file    Non-medical: Not on file  Tobacco Use  . Smoking status: Never Smoker  . Smokeless tobacco: Never Used  Substance and Sexual Activity  . Alcohol use: No    Alcohol/week: 0.0 standard drinks    Comment: rare  . Drug use: No  . Sexual activity: Not Currently  Lifestyle  . Physical activity:    Days per week: Not on file    Minutes per session: Not on file  . Stress: Not on file  Relationships  . Social connections:    Talks on phone: Not on file    Gets together: Not on file    Attends religious service: Not on file    Active member of club or organization: Not on file    Attends meetings of clubs or organizations: Not on file    Relationship status: Not on file  . Intimate partner violence:    Fear of current or ex partner: Not on file    Emotionally abused: Not on file    Physically abused: Not on file  Forced sexual activity: Not on file  Other Topics Concern  . Not on file  Social History Narrative   Regular exercise- yes, spins daily   Diet: fruits and veggies, water    No caffeine    Outpatient Medications Prior to Visit  Medication Sig Dispense Refill  . albuterol (PROVENTIL HFA;VENTOLIN HFA) 108 (90 Base) MCG/ACT inhaler Inhale 2 puffs into the lungs every 6 (six) hours as needed for wheezing or shortness of breath. 1 Inhaler 2  . amoxicillin (AMOXIL) 875 MG tablet Take 1 tablet (875 mg total) by mouth 2 (two) times daily. 20 tablet 0  . atorvastatin (LIPITOR) 20 MG tablet TAKE 1 TABLET BY MOUTH EVERY DAY 30 tablet 6  . Calcium-Vitamin D-Vitamin K (CALCIUM + D + K PO) Take 3 each by mouth daily. Calcium 589m- Vitamin D 1000iu- K 466m    . clonazePAM (KLONOPIN) 0.5 MG tablet Take 0.25 mg by mouth at bedtime as needed.   2  . escitalopram (LEXAPRO) 10 MG tablet Take 10 mg by mouth daily.     . Fluocinolone-Emollient (FLUOCINOLONE CREAM &  EMOLLIENT EX) Apply topically.    . gabapentin (NEURONTIN) 100 MG capsule Take 1-3 capsules (100-300 mg total) by mouth 2 (two) times daily. 180 capsule 12  . Glucosamine-Chondroitin (MOVE FREE PO) Take 1 tablet by mouth daily.    . Marland Kitchenbuprofen (ADVIL,MOTRIN) 200 MG tablet Take 200 mg by mouth every 6 (six) hours as needed.    . Marland Kitchenetoconazole-Hydrocortisone 2 & 1 % KIT Apply topically as needed. Cracks on mouth    . lamoTRIgine (LAMICTAL) 100 MG tablet TAKE 1005mn the morning and 200 mg by mouth before bed.    . Melatonin 3 MG CAPS Take 1 capsule by mouth at bedtime.    . meloxicam (MOBIC) 15 MG tablet TAKE 1 TABLET BY MOUTH EVERY DAY 30 tablet 1  . Naproxen Sodium (ALEVE) 220 MG CAPS As needed for headache 60 each   . NEOMYCIN-POLYMYXIN-HYDROCORTISONE (CORTISPORIN) 1 % SOLN OTIC solution Apply 1-2 drops to toe bid after soaking 10 mL 1  . propranolol (INDERAL) 40 MG tablet Take 1 tablet (40 mg total) by mouth 2 (two) times daily. 180 tablet 4  . QUEtiapine (SEROQUEL XR) 50 MG TB24 24 hr tablet Take 100 mg by mouth at bedtime.     . traZODone (DESYREL) 50 MG tablet Take 50 mg by mouth at bedtime as needed for sleep.     . tMarland Kitcheniamterene-hydrochlorothiazide (MAXZIDE-25) 37.5-25 MG tablet TAKE 1 TABLET BY MOUTH EVERY DAY 30 tablet 6   No facility-administered medications prior to visit.     No Known Allergies  Review of Systems  Constitutional: Positive for malaise/fatigue.  Gastrointestinal: Positive for constipation (very few bowel movements and the ones that have occurred are very soft) and nausea. Negative for abdominal pain, blood in stool, diarrhea, heartburn and vomiting.  Genitourinary: Negative for dysuria, flank pain, frequency, hematuria and urgency.  Neurological: Positive for weakness (overall fatigue and weakness- just doesnt feel good ). Negative for dizziness and headaches.  Psychiatric/Behavioral: Negative for depression. The patient is not nervous/anxious.        Objective:      Physical Exam  Constitutional: She appears well-developed and well-nourished.  HENT:  Head: Normocephalic and atraumatic.  Mouth/Throat: Uvula is midline and mucous membranes are normal.  Eyes: Conjunctivae are normal.  Neck: Neck supple.  Cardiovascular: Normal rate, regular rhythm and normal heart sounds.  Pulmonary/Chest: Effort normal and breath sounds normal.  Abdominal: Soft. Bowel sounds are normal. She exhibits no distension. There is no abdominal tenderness.  Neurological: She is alert.  Psychiatric: She has a normal mood and affect.  Vitals reviewed. Patient abdomen feels slightly bloated.  BP 122/62   Pulse (!) 54   Temp 98.8 F (37.1 C) (Oral)   Resp 14   Ht 5' 1"  (1.549 m)   Wt 49 kg   SpO2 98%   BMI 20.41 kg/m  Wt Readings from Last 3 Encounters:  04/05/18 49 kg  03/28/18 49.4 kg  03/03/18 50.1 kg    There are no preventive care reminders to display for this patient.  There are no preventive care reminders to display for this patient.   Lab Results  Component Value Date   TSH 0.97 12/05/2017   Lab Results  Component Value Date   WBC 8.4 06/08/2017   HGB 12.9 06/08/2017   HCT 37.4 06/08/2017   MCV 88.6 06/08/2017   PLT 263 06/08/2017   Lab Results  Component Value Date   NA 139 06/08/2017   K 4.5 06/08/2017   CO2 30 06/08/2017   GLUCOSE 89 06/08/2017   BUN 14 06/08/2017   CREATININE 0.90 06/08/2017   BILITOT 0.4 06/23/2017   ALKPHOS 76 06/23/2017   AST 30 06/23/2017   ALT 32 06/23/2017   PROT 7.1 06/23/2017   ALBUMIN 4.2 06/23/2017   CALCIUM 9.7 06/08/2017   GFR 70.15 11/26/2016   Lab Results  Component Value Date   CHOL 178 06/08/2017   Lab Results  Component Value Date   HDL 57 06/08/2017   Lab Results  Component Value Date   LDLCALC 106 (H) 06/08/2017   Lab Results  Component Value Date   TRIG 64 06/08/2017   Lab Results  Component Value Date   CHOLHDL 3.1 06/08/2017   Lab Results  Component Value Date    HGBA1C 5.7 10/18/2011       Assessment & Plan:   1. Nausea With bloating and increased flatulence. 2/2 antibiotic use. Has completed 7 days of Augmentin for sinusitis. Will stop antibiotic at this point as infection has been adequately treated. Start daily probiotic. Rx zofran for nausea. Begin BRAT diet. Close follow-up discussed.   - ondansetron (ZOFRAN) 4 MG tablet; Take 1 tablet (4 mg total) by mouth every 8 (eight) hours as needed for nausea or vomiting.  Dispense: 20 tablet; Refill: 0   No orders of the defined types were placed in this encounter.    Erin E Mecum, Student-PA

## 2018-04-10 ENCOUNTER — Other Ambulatory Visit: Payer: Self-pay | Admitting: Family Medicine

## 2018-04-11 DIAGNOSIS — B009 Herpesviral infection, unspecified: Secondary | ICD-10-CM | POA: Diagnosis not present

## 2018-04-11 DIAGNOSIS — L0109 Other impetigo: Secondary | ICD-10-CM | POA: Diagnosis not present

## 2018-04-20 DIAGNOSIS — F411 Generalized anxiety disorder: Secondary | ICD-10-CM | POA: Diagnosis not present

## 2018-04-20 DIAGNOSIS — F3181 Bipolar II disorder: Secondary | ICD-10-CM | POA: Diagnosis not present

## 2018-04-21 ENCOUNTER — Ambulatory Visit: Payer: Medicare Other | Admitting: Sports Medicine

## 2018-04-24 ENCOUNTER — Ambulatory Visit: Payer: Medicare Other | Admitting: Podiatry

## 2018-04-25 ENCOUNTER — Ambulatory Visit (INDEPENDENT_AMBULATORY_CARE_PROVIDER_SITE_OTHER): Payer: Medicare Other | Admitting: Sports Medicine

## 2018-04-25 ENCOUNTER — Encounter: Payer: Self-pay | Admitting: Sports Medicine

## 2018-04-25 VITALS — BP 120/72 | HR 46 | Ht 61.0 in | Wt 108.8 lb

## 2018-04-25 DIAGNOSIS — M9904 Segmental and somatic dysfunction of sacral region: Secondary | ICD-10-CM

## 2018-04-25 DIAGNOSIS — M9905 Segmental and somatic dysfunction of pelvic region: Secondary | ICD-10-CM

## 2018-04-25 DIAGNOSIS — M9903 Segmental and somatic dysfunction of lumbar region: Secondary | ICD-10-CM | POA: Diagnosis not present

## 2018-04-25 DIAGNOSIS — M9901 Segmental and somatic dysfunction of cervical region: Secondary | ICD-10-CM | POA: Diagnosis not present

## 2018-04-25 DIAGNOSIS — M9902 Segmental and somatic dysfunction of thoracic region: Secondary | ICD-10-CM

## 2018-04-25 DIAGNOSIS — M25511 Pain in right shoulder: Secondary | ICD-10-CM | POA: Diagnosis not present

## 2018-04-25 DIAGNOSIS — G8929 Other chronic pain: Secondary | ICD-10-CM

## 2018-04-26 ENCOUNTER — Ambulatory Visit (INDEPENDENT_AMBULATORY_CARE_PROVIDER_SITE_OTHER): Payer: Medicare Other | Admitting: Podiatry

## 2018-04-26 ENCOUNTER — Encounter: Payer: Self-pay | Admitting: Podiatry

## 2018-04-26 DIAGNOSIS — G5761 Lesion of plantar nerve, right lower limb: Secondary | ICD-10-CM

## 2018-04-26 DIAGNOSIS — G5781 Other specified mononeuropathies of right lower limb: Secondary | ICD-10-CM

## 2018-04-26 NOTE — Progress Notes (Signed)
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  States that is still draining a little bit but all in all it seems to be doing pretty good she states that the neuroma is about the same and really has not made any improvement.  Objective: Vital signs are stable alert and oriented x3 dry eschar along the tibiofibular border of the hallux right.  No signs of infection.  She still has pain on palpation with a palpable Mulder's click third interdigital space of the right foot.  Assessment: Well-healing surgical toe hallux right.  At this point neuroma third interdigital space nonhealing.  Plan: Discussed etiology pathology and surgical therapies this point started her with her first dose of dehydrated alcohol after sterile Betadine skin prep injected 2 cc of 4% dehydrated alcohol and local anesthetic.  Encouraged her to continue soaking the foot

## 2018-04-30 ENCOUNTER — Encounter: Payer: Self-pay | Admitting: Sports Medicine

## 2018-04-30 NOTE — Progress Notes (Signed)
Theresa Martin. Rig[place], [place] at [facility] [phone]  Theresa Martin - 63 y.o. female MRN [id number]  Date of birth: January 10, 1956  Visit Date: 01/28/2020February 2, 2020  PCP: [name], MD   Referred by: [name], MD  SUBJECTIVE:   Chief Complaint  Patient presents with  . Neck - Follow-up  . Right Shoulder - Follow-up    Trigger point inj 02/10/18. Has tried IBU/Naproxen. Provided with [employer]. Has responded well to OMT in the past.     HPI: Patient is here for follow-up of right neck and shoulder pain.  Pain is rated as moderate tightness.  He continues to improve with osteopathic manipulation but has worsened slightly over the past several weeks.  She is getting a headache once again.  It is worsened with picking up heavy objects.  Does radiate into her right neck and in the right posterior side of her head.  The trigger point injection at last visit was beneficial.  It is helped with rest heat and ice.  Ibuprofen and Aleve taken intermittently have been slightly helpful.  She has not been quite as good with performing home therapeutic exercises as previously prescribed but had been doing well with this previously.  REVIEW OF SYSTEMS: Per HPI  Otherwise 12 point review of systems performed and is negative   HISTORY:  Prior history reviewed and updated per electronic medical record.  Patient Active Problem List   Diagnosis Date Noted  . Chronic right shoulder pain 03/30/2017    Multifactorial shoulder pain including scapular dyskinesis and rotator cuff tendinopathy.  She does have some underlying cervical changes that are minimal with her symptoms and has responded well to osteopathic manipulation   . Right shoulder pain 01/12/2017  . Tendinopathy of rotator cuff 12/20/2016   Social History   Occupational History  . Occupation: Control and instrumentation engineer  Tobacco Use  . Smoking status: Never Smoker  .  Smokeless tobacco: Never Used  Substance and Sexual Activity  . Alcohol use: No    Alcohol/week: 0.0 standard drinks    Comment: rare  . Drug use: No  . Sexual activity: Not Currently   Social History   Social History Narrative   Regular exercise- yes, spins daily   Diet: fruits and veggies, water    No caffeine    OBJECTIVE:  VS:  HT:5\' 1"  (154.9 cm)   WT:108 lb 12.8 oz (49.4 kg)  BMI:20.57    BP:120/72  HR:(!) 46bpm  TEMP: ( )  RESP:98 %   PHYSICAL EXAM: Adult female. No acute distress.  Alert and appropriate. Good cervical and lumbar range of motion although there are functional limitations. Negative Spurling's compression test and Lhermitte's compression test.   Upper extremity lower extremity strength is 5/5 in all myotomes. Normal sensation Significant anterior chain dominant posture.    ASSESSMENT:  No diagnosis found.  PROCEDURES:  PROCEDURE NOTE: OSTEOPATHIC MANIPULATION  The decision today to treat with Osteopathic Manipulative Therapy (OMT) was based on physical exam findings. Verbal consent was obtained following a discussion with the patient regarding the of risks, benefits and potential side effects, including an acute pain flare,post manipulation soreness and need for repeat treatments.   Contraindications to OMT: NONE Manipulation was performed as below: Regions Treated & Osteopathic Exam Findings C2 through C4 FRS left C5 FRS right T2 extended side bent right T4 through T6 rotated left L3 FRS right Left on left sacral torsion Right anterior  innominate  OMT Techniques Used: HVLA muscle energy myofascial release  The patient tolerated the treatment well and reported Improved symptoms following treatment today. Patient was given medications, exercises, stretches and lifestyle modifications per AVS and verbally.      PLAN:  Pertinent additional documentation may be included in corresponding procedure notes, imaging studies, problem based  documentation and patient instructions.  No problem-specific Assessment & Plan notes found for this encounter.   Continue previously prescribed home exercise program.   Osteopathic manipulation was performed today based on physical exam findings.  Patient has responded well to osteopathic manipulation previously the prior manipulation did not provide permanent long lasting relief.  The patient does feel as though there was significant benefit to the prior manipulation and they wished for repeat manipulation today.  They understand that home therapeutic exercises are critical part of the healing/treatment process and will continue with self treatment between now and their next visit as outlined.  The patient understands that the frequency of visits is meant to provide a stimulus to promote the body's own ability to heal and is not meant to be the sole means for improvement in their symptoms.  Activity modifications and the importance of avoiding exacerbating activities (limiting pain to no more than a 4 / 10 during or following activity) recommended and discussed.  Discussed red flag symptoms that warrant earlier emergent evaluation and patient voices understanding.   At follow up will plan to consider: repeat osteopathic manipulation  Return in about 4 weeks (around 05/23/2018).          Gerda Diss, [place] [facility] Physician

## 2018-05-04 ENCOUNTER — Encounter: Payer: Self-pay | Admitting: Sports Medicine

## 2018-05-04 NOTE — Progress Notes (Signed)
[name]. Amdrew Oboyle, [place] at [facility] [phone]  MARIALUISA BASARA - 63 y.o. female MRN [id number]  Date of birth: [birth year]  Visit Date: 12/06/2019February 6, 2020  PCP: [name], MD   Referred by: [name], MD  SUBJECTIVE:   Chief Complaint  Patient presents with  . f/u neck and R shoulder pain    Sx were improving but started to flare up again last week. She has responded well to OMT in the past. Taking Aleve, IBU prn with some releif.     HPI: Patient does feel like she is doing well with osteopathic manipulation in her home therapeutic exercises.  The upper trap trigger point that was injected last visit is doing better.  REVIEW OF SYSTEMS: Denies fevers, chills, recent weight gain or weight loss.  No night sweats. No significant nighttime awakenings due to this issue. Pt denies any change in bowel or bladder habits, muscle weakness, numbness or falls associated with this pain.  HISTORY:  Prior history reviewed and updated per electronic medical record.  Patient Active Problem List   Diagnosis Date Noted  . Skin cancer     moles on right groin and right buttock.   . Osteopenia after menopause   . Chronic right shoulder pain 03/30/2017    Multifactorial shoulder pain including scapular dyskinesis and rotator cuff tendinopathy.  She does have some underlying cervical changes that are minimal with her symptoms and has responded well to osteopathic manipulation   . Right shoulder pain 01/12/2017  . Tendinopathy of rotator cuff 12/20/2016  . Angle-closure glaucoma, severe stage 08/19/2015  . Osteopenia 06/19/2015  . Migraine without aura and without status migrainosus, not intractable 01/13/2015  . High risk medications (not anticoagulants) long-term use 09/26/2014  . Breast signs and symptoms 02/08/2014  . Routine general medical examination at a health care facility 07/27/2012  . Hyperlipidemia  09/29/2010  . URETEROPELVIC JUNCTION OBSTRUCTION, CONGENITAL 12/15/2009    Qualifier: Diagnosis of  By: [name] MD, Amy     . CONSTIPATION 12/11/2009    Qualifier: Diagnosis of  By: [name] MD, Amy     . CARDIAC MURMUR 11/26/2008    Qualifier: Diagnosis of  By: [name] MD, Amy     . MAGNETIC RESONANCE IMAGING, BRAIN, ABNORMAL 10/29/2008    Qualifier: Diagnosis of  By: [name] MD, Amy     . VENEREAL WART 10/23/2008    Qualifier: History of  By: [name] MD, Amy     . Major depressive disorder, recurrent episode ([place]) 10/23/2008    Qualifier: Diagnosis of  By: [name] MD, Amy     . COMMON MIGRAINE 10/23/2008    Qualifier: Diagnosis of  By: [name] MD, Amy     . Essential hypertension 10/23/2008    Qualifier: Diagnosis of  By: [name] MD, Amy     . ALLERGIC RHINITIS 10/23/2008    Qualifier: Diagnosis of  By: [name] MD, Amy     . ASTHMA, INTERMITTENT, MILD 10/23/2008    Qualifier: History of  By: [name] MD, Amy     . RENAL CALCULUS, HX OF 10/23/2008    Qualifier: Diagnosis of  By: [name] MD, Amy      Social History   Occupational History  . Occupation: Control and instrumentation engineer  Tobacco Use  . Smoking status: Never Smoker  . Smokeless tobacco: Never Used  Substance and Sexual Activity  . Alcohol use: No    Alcohol/week: 0.0 standard drinks  Comment: rare  . Drug use: No  . Sexual activity: Not Currently   Social History   Social History Narrative   Regular exercise- yes, spins daily   Diet: fruits and veggies, water    No caffeine    OBJECTIVE:  VS:  HT:5\' 1"  (154.9 cm)   WT:110 lb 6.4 oz (50.1 kg)  BMI:20.87    BP:110/70  HR:(!) 52bpm  TEMP: ( )  RESP:98 %   PHYSICAL EXAM: Adult female. No acute distress.  Alert and appropriate. Good cervical and lumbar range of motion although there are functional limitations. Negative Spurling's compression test and Lhermitte's compression test.   Upper extremity lower extremity strength is 5/5 in  all myotomes. Normal sensation Significant anterior chain dominant posture.    ASSESSMENT:   1. Chronic right shoulder pain   2. Somatic dysfunction of cervical region   3. Somatic dysfunction of thoracic region   4. Somatic dysfunction of rib cage region     PROCEDURES:  PROCEDURE NOTE : OSTEOPATHIC MANIPULATION The decision today to treat with Osteopathic Manipulative Therapy (OMT) was based on physical exam findings. Verbal consent was obtained following a discussion with the patient regarding the of risks, benefits and potential side effects, including an acute pain flare,post manipulation soreness and need for repeat treatments. Minimal risk of injury to neurovascular structures with cervical manipulation previously discussed in detailed and reaffirmed today.   Contraindications to OMT: NONE  Manipulation was performed as below: Regions Treated & Osteopathic Exam Findings  CERVICAL SPINE: OA - rotated right C5 Extended, rotated LEFT, sidebent LEFT THORACIC SPINE:  T2 - 4 Neutral, rotated LEFT, sidebent RIGHT RIBS:  Rib 1 Right  Exhalation dysfunction (elevated/inhaled)   OMT Techniques Used  HVLA muscle energy myofascial release    The patient tolerated the treatment well and reported Improved symptoms following treatment today. Patient was given medications, exercises, stretches and lifestyle modifications per AVS and verbally.     PLAN:  Pertinent additional documentation may be included in corresponding procedure notes, imaging studies, problem based documentation and patient instructions.  Continue previously prescribed home exercise program.   Osteopathic manipulation was performed today based on physical exam findings.  Patient has responded well to osteopathic manipulation previously the prior manipulation did not provide permanent long lasting relief.  The patient does feel as though there was significant benefit to the prior manipulation and they wished for  repeat manipulation today.  They understand that home therapeutic exercises are critical part of the healing/treatment process and will continue with self treatment between now and their next visit as outlined.  The patient understands that the frequency of visits is meant to provide a stimulus to promote the body's own ability to heal and is not meant to be the sole means for improvement in their symptoms.  Activity modifications and the importance of avoiding exacerbating activities (limiting pain to no more than a 4 / 10 during or following activity) recommended and discussed.  Discussed red flag symptoms that warrant earlier emergent evaluation and patient voices understanding.   Lab Orders  No laboratory test(s) ordered today   Imaging Orders  No imaging studies ordered today   Referral Orders  No referral(s) requested today    At follow up will plan to consider: repeat osteopathic manipulation  No follow-ups on file.          Gerda Diss, [place] [facility] Physician

## 2018-05-12 ENCOUNTER — Ambulatory Visit (INDEPENDENT_AMBULATORY_CARE_PROVIDER_SITE_OTHER): Payer: Medicare Other | Admitting: Sports Medicine

## 2018-05-12 ENCOUNTER — Encounter: Payer: Self-pay | Admitting: Sports Medicine

## 2018-05-12 VITALS — BP 120/62 | HR 52 | Ht 61.0 in | Wt 109.8 lb

## 2018-05-12 DIAGNOSIS — M9905 Segmental and somatic dysfunction of pelvic region: Secondary | ICD-10-CM | POA: Diagnosis not present

## 2018-05-12 DIAGNOSIS — M25511 Pain in right shoulder: Secondary | ICD-10-CM

## 2018-05-12 DIAGNOSIS — G8929 Other chronic pain: Secondary | ICD-10-CM

## 2018-05-12 DIAGNOSIS — M9902 Segmental and somatic dysfunction of thoracic region: Secondary | ICD-10-CM | POA: Diagnosis not present

## 2018-05-12 DIAGNOSIS — M9908 Segmental and somatic dysfunction of rib cage: Secondary | ICD-10-CM

## 2018-05-12 DIAGNOSIS — M9901 Segmental and somatic dysfunction of cervical region: Secondary | ICD-10-CM

## 2018-05-14 ENCOUNTER — Encounter: Payer: Self-pay | Admitting: Sports Medicine

## 2018-05-14 NOTE — Progress Notes (Signed)
[name]. Shizuye Rupert, [place] at [facility] [phone]  IRAIS MOTTRAM - 63 y.o. female MRN [id number]  Date of birth: March 23, 1956  Visit Date: 05/12/2018  PCP: [name], MD   Referred by: [name], MD  SUBJECTIVE:   Chief Complaint  Patient presents with  . Follow-up    R shoulder pain.  IBU and Aleve prn.  Archie Balboa and HEP    HPI: Patient is here for recurrent right shoulder and neck pain.  Pain does radiate into the posterior aspect of her right head is worsened with picking up heavy items.  She has had a right trigger point injection on the right with moderate improvement into her trap region.  She has taken ibuprofen and Aleve intermittently with good improvement.  She has been performing her home therapeutic exercise program including [employer] with moderate improvement.  Requesting repeat manipulation today.  REVIEW OF SYSTEMS: Denies fevers, chills, recent weight gain or weight loss.  No night sweats.  Pt denies any change in bowel or bladder habits, muscle weakness, numbness or falls associated with this pain.  HISTORY:  Prior history reviewed and updated per electronic medical record.  Patient Active Problem List   Diagnosis Date Noted  . Skin cancer     moles on right groin and right buttock.   . Osteopenia after menopause   . Chronic right shoulder pain 03/30/2017    Multifactorial shoulder pain including scapular dyskinesis and rotator cuff tendinopathy.  She does have some underlying cervical changes that are minimal with her symptoms and has responded well to osteopathic manipulation   . Right shoulder pain 01/12/2017  . Tendinopathy of rotator cuff 12/20/2016  . Angle-closure glaucoma, severe stage 08/19/2015  . Osteopenia 06/19/2015  . Migraine without aura and without status migrainosus, not intractable 01/13/2015  . High risk medications (not anticoagulants) long-term use 09/26/2014    . Breast signs and symptoms 02/08/2014  . Routine general medical examination at a health care facility 07/27/2012  . Hyperlipidemia 09/29/2010  . URETEROPELVIC JUNCTION OBSTRUCTION, CONGENITAL 12/15/2009    Qualifier: Diagnosis of  By: [name] MD, Amy     . CONSTIPATION 12/11/2009    Qualifier: Diagnosis of  By: [name] MD, Amy     . CARDIAC MURMUR 11/26/2008    Qualifier: Diagnosis of  By: [name] MD, Amy     . MAGNETIC RESONANCE IMAGING, BRAIN, ABNORMAL 10/29/2008    Qualifier: Diagnosis of  By: [name] MD, Amy     . VENEREAL WART 10/23/2008    Qualifier: History of  By: [name] MD, Amy     . Major depressive disorder, recurrent episode ([place]) 10/23/2008    Qualifier: Diagnosis of  By: [name] MD, Amy     . COMMON MIGRAINE 10/23/2008    Qualifier: Diagnosis of  By: [name] MD, Amy     . Essential hypertension 10/23/2008    Qualifier: Diagnosis of  By: [name] MD, Amy     . ALLERGIC RHINITIS 10/23/2008    Qualifier: Diagnosis of  By: [name] MD, Amy     . ASTHMA, INTERMITTENT, MILD 10/23/2008    Qualifier: History of  By: [name] MD, Amy     . RENAL CALCULUS, HX OF 10/23/2008    Qualifier: Diagnosis of  By: [name] MD, Amy      Social History   Occupational History  . Occupation: Control and instrumentation engineer  Tobacco Use  . Smoking status: Never Smoker  . Smokeless tobacco:  Never Used  Substance and Sexual Activity  . Alcohol use: No    Alcohol/week: 0.0 standard drinks    Comment: rare  . Drug use: No  . Sexual activity: Not Currently   Social History   Social History Narrative   Regular exercise- yes, spins daily   Diet: fruits and veggies, water    No caffeine    OBJECTIVE:  VS:  HT:5\' 1"  (154.9 cm)   WT:109 lb 12.8 oz (49.8 kg)  BMI:20.76    BP:120/62  HR:(!) 52bpm  TEMP: ( )  RESP:95 %   PHYSICAL EXAM: Adult female. No acute distress.  Alert and appropriate. Good cervical and lumbar range of motion although there are functional  limitations. Negative Spurling's compression test and Lhermitte's compression test.   Upper extremity lower extremity strength is 5/5 in all myotomes. Normal sensation Significant anterior chain dominant posture.    ASSESSMENT:   1. Chronic right shoulder pain   2. Somatic dysfunction of pelvis region   3. Somatic dysfunction of thoracic region   4. Somatic dysfunction of rib cage region   5. Somatic dysfunction of cervical region     PROCEDURES:  PROCEDURE NOTE : OSTEOPATHIC MANIPULATION The decision today to treat with Osteopathic Manipulative Therapy (OMT) was based on physical exam findings. Verbal consent was obtained following a discussion with the patient regarding the of risks, benefits and potential side effects, including an acute pain flare,post manipulation soreness and need for repeat treatments. Minimal risk of injury to neurovascular structures with cervical manipulation previously discussed in detailed and reaffirmed today.   Contraindications to OMT: NONE  Manipulation was performed as below: Regions Treated & Osteopathic Exam Findings  CERVICAL SPINE: OA - rotated right C4 FRS LEFT (Flexed, Rotated & Sidebent) THORACIC SPINE:  T2 ERS LEFT (Extended, Rotated & Sidebent) RIBS:  Rib 6 Right  Inhalation dysfunction (depressed/exhaled) PELVIS:  Right psoas spasm Right anterior innonimate   OMT Techniques Used  HVLA muscle energy myofascial release    The patient tolerated the treatment well and reported Improved symptoms following treatment today. Patient was given medications, exercises, stretches and lifestyle modifications per AVS and verbally.     PLAN:  Pertinent additional documentation may be included in corresponding procedure notes, imaging studies, problem based documentation and patient instructions.  Continue previously prescribed home exercise program.   Discussed the underlying features of tight hip flexors leading to crouched, fetal like  position that results in spinal column compression.  Including lumbar hyperflexion with hypermobility, thoracic flexion with restrictive rotation and cervical lordosis reversal.   Osteopathic manipulation was performed today based on physical exam findings.  Patient has responded well to osteopathic manipulation previously the prior manipulation did not provide permanent long lasting relief.  The patient does feel as though there was significant benefit to the prior manipulation and they wished for repeat manipulation today.  They understand that home therapeutic exercises are critical part of the healing/treatment process and will continue with self treatment between now and their next visit as outlined.  The patient understands that the frequency of visits is meant to provide a stimulus to promote the body's own ability to heal and is not meant to be the sole means for improvement in their symptoms.  Activity modifications and the importance of avoiding exacerbating activities (limiting pain to no more than a 4 / 10 during or following activity) recommended and discussed.  Discussed red flag symptoms that warrant earlier emergent evaluation and patient voices understanding.   No  orders of the defined types were placed in this encounter.  Lab Orders  No laboratory test(s) ordered today   Imaging Orders  No imaging studies ordered today   Referral Orders  No referral(s) requested today    Return in about 4 weeks (around 06/09/2018).          Gerda Diss, [place] [facility] Physician

## 2018-05-17 ENCOUNTER — Ambulatory Visit (INDEPENDENT_AMBULATORY_CARE_PROVIDER_SITE_OTHER): Payer: Medicare Other | Admitting: Podiatry

## 2018-05-17 ENCOUNTER — Ambulatory Visit: Payer: Medicare Other | Admitting: Podiatry

## 2018-05-17 ENCOUNTER — Encounter: Payer: Self-pay | Admitting: Podiatry

## 2018-05-17 DIAGNOSIS — L918 Other hypertrophic disorders of the skin: Secondary | ICD-10-CM | POA: Diagnosis not present

## 2018-05-17 DIAGNOSIS — G5761 Lesion of plantar nerve, right lower limb: Secondary | ICD-10-CM | POA: Diagnosis not present

## 2018-05-17 DIAGNOSIS — Z86018 Personal history of other benign neoplasm: Secondary | ICD-10-CM | POA: Diagnosis not present

## 2018-05-17 DIAGNOSIS — G5781 Other specified mononeuropathies of right lower limb: Secondary | ICD-10-CM

## 2018-05-17 DIAGNOSIS — Z85820 Personal history of malignant melanoma of skin: Secondary | ICD-10-CM | POA: Diagnosis not present

## 2018-05-17 DIAGNOSIS — L578 Other skin changes due to chronic exposure to nonionizing radiation: Secondary | ICD-10-CM | POA: Diagnosis not present

## 2018-05-17 DIAGNOSIS — Q828 Other specified congenital malformations of skin: Secondary | ICD-10-CM

## 2018-05-17 DIAGNOSIS — B009 Herpesviral infection, unspecified: Secondary | ICD-10-CM | POA: Diagnosis not present

## 2018-05-17 NOTE — Progress Notes (Signed)
Theresa Martin presents today for follow-up of neuroma second interspace of the right foot.  States is a little bit better but still bothers me first thing in the morning and whenever up and wearing tighter shoes.  She also complaining of a painful area to the sub-fifth met head right foot.  Denies any trauma denies any foreign bodies.  Objective: Vital signs are stable alert and oriented x3.  Pulses are palpable.  I evaluated and debrided the small area of reactive hyperkeratosis sub-fifth metatarsal head of the right foot.  It appears to have been a foreign body may be a small piece of glass that came out of the area.  She related that it felt better prior to her leaving him placed small dressing.  The neuroma to the third interdigital space is still palpable and painful on exam.  Assessment: Neuroma third interspace right.  Small area of reactive hyperkeratosis and foreign body right sub-fifth head right.  Plan: Discussed etiology pathology conservative therapies after Betadine skin prep I debrided the area of foreign body and removed the object that I found I also reinjected her second dose of a 4% dehydrated alcohol to the third interdigital space of the right foot.  Follow-up with her in 3 to 4 weeks

## 2018-05-22 ENCOUNTER — Ambulatory Visit: Payer: Medicare Other | Admitting: Sports Medicine

## 2018-05-22 DIAGNOSIS — F3181 Bipolar II disorder: Secondary | ICD-10-CM | POA: Diagnosis not present

## 2018-05-22 DIAGNOSIS — F411 Generalized anxiety disorder: Secondary | ICD-10-CM | POA: Diagnosis not present

## 2018-05-22 DIAGNOSIS — Z79899 Other long term (current) drug therapy: Secondary | ICD-10-CM | POA: Diagnosis not present

## 2018-05-24 ENCOUNTER — Ambulatory Visit: Payer: Medicare Other | Admitting: Sports Medicine

## 2018-05-26 ENCOUNTER — Ambulatory Visit (INDEPENDENT_AMBULATORY_CARE_PROVIDER_SITE_OTHER): Payer: Medicare Other | Admitting: Podiatry

## 2018-05-26 ENCOUNTER — Encounter: Payer: Self-pay | Admitting: Podiatry

## 2018-05-26 DIAGNOSIS — L989 Disorder of the skin and subcutaneous tissue, unspecified: Secondary | ICD-10-CM | POA: Diagnosis not present

## 2018-05-30 NOTE — Progress Notes (Signed)
   Subjective: 63 year old female presenting today for follow up evaluation of a neuroma of the right foot. She states the pain has not improved at all. She reports a painful callus lesion of the right foot as well. She states it feels as if she is walking on a rock. Walking and bearing weight increases the pain. She has not had any recent treatment. Patient is here for further evaluation and treatment.   Past Medical History:  Diagnosis Date  . Allergy   . Anxiety   . Depression   . Hyperlipidemia   . Hypertension   . Kidney stones   . Osteopenia after menopause   . Persistent headaches   . Pneumonia   . Postherpetic neuralgia   . Shingles outbreak 02/2014  . Skin cancer    moles on right groin and right buttock.     Objective:  Physical Exam General: Alert and oriented x3 in no acute distress  Dermatology: Hyperkeratotic lesion present on the sub-fifth MPJ of the right foot. Pain on palpation with a central nucleated core noted. Skin is warm, dry and supple bilateral lower extremities. Negative for open lesions or macerations.  Vascular: Palpable pedal pulses bilaterally. No edema or erythema noted. Capillary refill within normal limits.  Neurological: Epicritic and protective threshold grossly intact bilaterally.   Musculoskeletal Exam: Pain on palpation at the keratotic lesion noted. Range of motion within normal limits bilateral. Muscle strength 5/5 in all groups bilateral.  Assessment: 1. Porokeratosis sub-fifth MPJ right   Plan of Care:  1. Patient evaluated 2. Excisional debridement of keratoic lesion using a chisel blade was performed without incident. Salinocaine applied.  3. Dressed area with light dressing. 4. Patient is to return to the clinic PRN.   Edrick Kins, DPM [facility]  Dr. Edrick Kins, [place] [place]                                        [place], [place] [address]                Office [phone]  Fax [phone]

## 2018-06-07 ENCOUNTER — Encounter (INDEPENDENT_AMBULATORY_CARE_PROVIDER_SITE_OTHER): Payer: Medicare Other | Admitting: Podiatry

## 2018-06-07 NOTE — Progress Notes (Signed)
This encounter was created in error - please disregard.

## 2018-06-08 ENCOUNTER — Ambulatory Visit (INDEPENDENT_AMBULATORY_CARE_PROVIDER_SITE_OTHER): Payer: Medicare Other | Admitting: Podiatry

## 2018-06-08 ENCOUNTER — Other Ambulatory Visit: Payer: Self-pay

## 2018-06-08 ENCOUNTER — Encounter: Payer: Self-pay | Admitting: Podiatry

## 2018-06-08 DIAGNOSIS — G5761 Lesion of plantar nerve, right lower limb: Secondary | ICD-10-CM

## 2018-06-08 DIAGNOSIS — M778 Other enthesopathies, not elsewhere classified: Secondary | ICD-10-CM

## 2018-06-08 DIAGNOSIS — M779 Enthesopathy, unspecified: Secondary | ICD-10-CM

## 2018-06-08 DIAGNOSIS — Q828 Other specified congenital malformations of skin: Secondary | ICD-10-CM

## 2018-06-08 DIAGNOSIS — G5781 Other specified mononeuropathies of right lower limb: Secondary | ICD-10-CM

## 2018-06-08 NOTE — Progress Notes (Signed)
She presents today for follow-up of neuroma third interdigital space of the right foot states it is burning a lot and numbness to get the sole space and the bottom of the foot she returns refers to the fifth metatarsal.  Objective: Vital signs are stable she alert oriented x3 porokeratotic lesion with bursitis overlying the fifth metatarsal plantarly.  She also has a palpable neuroma to the third interdigital space of the left foot.  2 previous injections of dehydrated alcohol failed to alleviate any of her symptoms.  Assessment: Plantar bursitis with porokeratotic lesion fifth metatarsal right foot.  Also has neuroma third interdigital space right foot.  Plan: Discussed etiology pathology conservative versus surgical therapies at this point went ahead and injected her third dose of dehydrated alcohol a total of 2 cc 4% dehydrated alcohol and local anesthetic was injected.  Also injected 2 mg of dexamethasone and local anesthetic sub-leisurely fifth metatarsal phalangeal joint plantarly deep nucleated the porokeratotic lesion.  Would like to follow-up with her in 3 weeks.

## 2018-06-09 ENCOUNTER — Other Ambulatory Visit: Payer: Self-pay

## 2018-06-09 ENCOUNTER — Ambulatory Visit (INDEPENDENT_AMBULATORY_CARE_PROVIDER_SITE_OTHER): Payer: Medicare Other | Admitting: Sports Medicine

## 2018-06-09 ENCOUNTER — Encounter: Payer: Self-pay | Admitting: Sports Medicine

## 2018-06-09 VITALS — BP 110/62 | HR 42 | Ht 61.0 in | Wt 109.2 lb

## 2018-06-09 DIAGNOSIS — M9902 Segmental and somatic dysfunction of thoracic region: Secondary | ICD-10-CM | POA: Diagnosis not present

## 2018-06-09 DIAGNOSIS — M25511 Pain in right shoulder: Secondary | ICD-10-CM | POA: Diagnosis not present

## 2018-06-09 DIAGNOSIS — M9908 Segmental and somatic dysfunction of rib cage: Secondary | ICD-10-CM | POA: Diagnosis not present

## 2018-06-09 DIAGNOSIS — M9905 Segmental and somatic dysfunction of pelvic region: Secondary | ICD-10-CM

## 2018-06-09 DIAGNOSIS — G8929 Other chronic pain: Secondary | ICD-10-CM | POA: Diagnosis not present

## 2018-06-09 DIAGNOSIS — M9901 Segmental and somatic dysfunction of cervical region: Secondary | ICD-10-CM | POA: Diagnosis not present

## 2018-06-12 ENCOUNTER — Encounter: Payer: Self-pay | Admitting: Sports Medicine

## 2018-06-12 NOTE — Progress Notes (Signed)
Theresa Martin. Edsel Shives, [place] at [facility] [phone]  Theresa Martin - 63 y.o. female MRN [id number]  Date of birth: 02-11-1956  Visit Date: 06/09/2018  PCP: [name], MD   Referred by: [name], MD  SUBJECTIVE:   Chief Complaint  Patient presents with  . Right Shoulder - Follow-up  . Neck - Follow-up    HPI: Right shoulder continues to be painful.  She has good days and bad days and overall is getting good relief with osteopathic manipulation  REVIEW OF SYSTEMS: Denies fevers, chills, recent weight gain or weight loss.  No night sweats.  Pt denies any change in bowel or bladder habits, muscle weakness, numbness or falls associated with this pain.  HISTORY:  Prior history reviewed and updated per electronic medical record.  Patient Active Problem List   Diagnosis Date Noted  . Skin cancer     moles on right groin and right buttock.   . Osteopenia after menopause   . Chronic right shoulder pain 03/30/2017    Multifactorial shoulder pain including scapular dyskinesis and rotator cuff tendinopathy.  She does have some underlying cervical changes that are minimal with her symptoms and has responded well to osteopathic manipulation   . Right shoulder pain 01/12/2017  . Tendinopathy of rotator cuff 12/20/2016  . Angle-closure glaucoma, severe stage 08/19/2015  . Osteopenia 06/19/2015  . Migraine without aura and without status migrainosus, not intractable 01/13/2015  . High risk medications (not anticoagulants) long-term use 09/26/2014  . Breast signs and symptoms 02/08/2014  . Routine general medical examination at a health care facility 07/27/2012  . Hyperlipidemia 09/29/2010  . URETEROPELVIC JUNCTION OBSTRUCTION, CONGENITAL 12/15/2009    Qualifier: Diagnosis of  By: [name] MD, Amy     . CONSTIPATION 12/11/2009    Qualifier: Diagnosis of  By: [name] MD, Amy     . CARDIAC MURMUR 11/26/2008   Qualifier: Diagnosis of  By: [name] MD, Amy     . MAGNETIC RESONANCE IMAGING, BRAIN, ABNORMAL 10/29/2008    Qualifier: Diagnosis of  By: [name] MD, Amy     . VENEREAL WART 10/23/2008    Qualifier: History of  By: [name] MD, Amy     . Major depressive disorder, recurrent episode ([place]) 10/23/2008    Qualifier: Diagnosis of  By: [name] MD, Amy     . COMMON MIGRAINE 10/23/2008    Qualifier: Diagnosis of  By: [name] MD, Amy     . Essential hypertension 10/23/2008    Qualifier: Diagnosis of  By: [name] MD, Amy     . ALLERGIC RHINITIS 10/23/2008    Qualifier: Diagnosis of  By: [name] MD, Amy     . ASTHMA, INTERMITTENT, MILD 10/23/2008    Qualifier: History of  By: [name] MD, Amy     . RENAL CALCULUS, HX OF 10/23/2008    Qualifier: Diagnosis of  By: [name] MD, Amy      Social History   Occupational History  . Occupation: Control and instrumentation engineer  Tobacco Use  . Smoking status: Never Smoker  . Smokeless tobacco: Never Used  Substance and Sexual Activity  . Alcohol use: No    Alcohol/week: 0.0 standard drinks    Comment: rare  . Drug use: No  . Sexual activity: Not Currently   Social History   Social History Narrative   Regular exercise- yes, spins daily   Diet: fruits and veggies, water    No caffeine    OBJECTIVE:  VS:  HT:5\' 1"  (154.9 cm)   WT:109 lb 3.2 oz (49.5 kg)  BMI:20.64    BP:110/62  HR:(!) 42bpm  TEMP: ( )  RESP:99 %   PHYSICAL EXAM: Adult female. No acute distress.  Alert and appropriate. Good cervical and lumbar range of motion although there are functional limitations. Negative Spurling's compression test and Lhermitte's compression test.   Upper extremity lower extremity strength is 5/5 in all myotomes. Normal sensation Significant anterior chain dominant posture.    ASSESSMENT:   1. Chronic right shoulder pain   2. Somatic dysfunction of cervical region   3. Somatic dysfunction of pelvis region   4. Somatic dysfunction  of thoracic region   5. Somatic dysfunction of rib cage region     PROCEDURES:  PROCEDURE NOTE : OSTEOPATHIC MANIPULATION The decision today to treat with Osteopathic Manipulative Therapy (OMT) was based on physical exam findings. Verbal consent was obtained following a discussion with the patient regarding the of risks, benefits and potential side effects, including an acute pain flare,post manipulation soreness and need for repeat treatments. Minimal risk of injury to neurovascular structures with cervical manipulation previously discussed in detailed and reaffirmed today.   Contraindications to OMT: NONE  Manipulation was performed as below: [facility] Treated & Osteopathic Exam Findings  CERVICAL SPINE: OA - rotated right C4 FRS LEFT (Flexed, Rotated & Sidebent) THORACIC SPINE:  T2 ERS LEFT (Extended, Rotated & Sidebent) RIBS:  Rib 6 Right  Inhalation dysfunction (depressed/exhaled) PELVIS:  Right psoas spasm Right anterior innonimate   OMT Techniques Used  HVLA muscle energy myofascial release      The patient tolerated the treatment well and reported Improved symptoms following treatment today. Patient was given medications, exercises, stretches and lifestyle modifications per AVS and verbally.     PLAN:  Pertinent additional documentation may be included in corresponding procedure notes, imaging studies, problem based documentation and patient instructions.  Continue previously prescribed home exercise program.   Discussed the underlying features of tight hip flexors leading to crouched, fetal like position that results in spinal column compression.  Including lumbar hyperflexion with hypermobility, thoracic flexion with restrictive rotation and cervical lordosis reversal.   Osteopathic manipulation was performed today based on physical exam findings.  Patient has responded well to osteopathic manipulation previously the prior manipulation did not provide permanent long  lasting relief.  The patient does feel as though there was significant benefit to the prior manipulation and they wished for repeat manipulation today.  They understand that home therapeutic exercises are critical part of the healing/treatment process and will continue with self treatment between now and their next visit as outlined.  The patient understands that the frequency of visits is meant to provide a stimulus to promote the body's own ability to heal and is not meant to be the sole means for improvement in their symptoms.  Activity modifications and the importance of avoiding exacerbating activities (limiting pain to no more than a 4 / 10 during or following activity) recommended and discussed.  Discussed red flag symptoms that warrant earlier emergent evaluation and patient voices understanding.   No orders of the defined types were placed in this encounter.  Lab Orders  No laboratory test(s) ordered today   Imaging Orders  No imaging studies ordered today   Referral Orders  No referral(s) requested today    Return in about 4 weeks (around 07/07/2018) for consideration of repeat Osteopathic Manipulation.          Gerda Diss,  [facility] Physician

## 2018-06-13 ENCOUNTER — Telehealth: Payer: Self-pay | Admitting: Podiatry

## 2018-06-13 NOTE — Telephone Encounter (Signed)
Patient has a painful foot/she is having burning. She wanted to know if she should come in for appointment tomorrow. Left a message on the nurse line in [place] yesterday 06/12/2018

## 2018-06-13 NOTE — Telephone Encounter (Signed)
I spoke with patient, she states that she is still having pain since her injections, but the burning in her foot is much better.  She did tell me that she has not rested since her injections and she has been on her feet a lot.  I educated her on the importance of resting, icing and elevating foot after steroid injections.  I instructed her to soak foot in warm Epson salt and take 600mg  Ibuprofen and 1 ES Tylenol, ice and elevate, if no better in the next 2 days to call back and we will work her in.  She verbalized understanding

## 2018-06-14 ENCOUNTER — Ambulatory Visit: Payer: Medicare Other | Admitting: Podiatry

## 2018-06-16 ENCOUNTER — Telehealth: Payer: Self-pay | Admitting: Sports Medicine

## 2018-06-16 NOTE — Telephone Encounter (Signed)
See note  Copied from [place] [id number]. Topic: General - Other >> Jun 16, 2018 10:57 AM Bea Graff, NT wrote: Reason for CRM: Pt is wanting to start seeing Dr, Paulla Fore every 3 weeks instead of 4 but insurance needs a statement stating this is medically necessary in order for them to cover this. Pt is wanting to see if Dr. Paulla Fore can do this for her.

## 2018-06-19 ENCOUNTER — Other Ambulatory Visit: Payer: Self-pay | Admitting: Physical Therapy

## 2018-06-19 ENCOUNTER — Encounter: Payer: Self-pay | Admitting: Physical Therapy

## 2018-06-19 NOTE — Telephone Encounter (Signed)
Dr. Paulla Fore willing to write letter.  Called pt and informed her of this and also informed her that Dr. Paulla Fore is currently not doing any OMT treatments for at least the next 2 weeks.  The pt verbalized understanding.

## 2018-06-22 NOTE — Telephone Encounter (Signed)
Called pt to verify exactly what needs to go in the letter she is requesting for her insurance company.  Pt called her ins co and then returned my call, stating that evidently she doesn't need a letter after all.  She states that the person she spoke to at [facility] stated that she doesn't need a letter but that her visit note needs to specify that the treatment she receives is medically necessary.

## 2018-06-26 ENCOUNTER — Telehealth: Payer: Self-pay | Admitting: Podiatry

## 2018-06-26 NOTE — Telephone Encounter (Signed)
Patient has been coming in for injections for her neuroma and was supposed to have a series of 7 shots. Patients appt is being rescheduled due to CDC restrictions and would like to know if it will be okay to have her appointments delayed or will it affect her treatment/results.

## 2018-06-29 ENCOUNTER — Ambulatory Visit: Payer: Medicare Other | Admitting: Podiatry

## 2018-07-10 ENCOUNTER — Telehealth: Payer: Self-pay | Admitting: Family Medicine

## 2018-07-10 ENCOUNTER — Ambulatory Visit: Payer: Medicare Other | Admitting: Sports Medicine

## 2018-07-10 ENCOUNTER — Telehealth: Payer: Self-pay | Admitting: Sports Medicine

## 2018-07-10 NOTE — Telephone Encounter (Signed)
Copied from [place] [phone]. Topic: General - Other >> Jul 07, 2018  8:29 AM Leward Quan A wrote: Reason for CRM: Patient called to speak to [facility] about scheduling an appointment with Dr Paulla Fore. Please advise

## 2018-07-10 NOTE — Telephone Encounter (Signed)
Copied from [place] [phone]. Topic: General - Other >> Jul 07, 2018  8:29 AM Leward Quan A wrote: Reason for CRM: Patient called to speak to [facility] about scheduling an appointment with Dr Paulla Fore. Please advise >> Jul 10, 2018  3:26 PM Reyne Dumas L wrote: Pt called and left message on [place] trying to reschedule appointment with Dr. Paulla Fore.

## 2018-08-10 ENCOUNTER — Telehealth: Payer: Self-pay | Admitting: *Deleted

## 2018-08-10 ENCOUNTER — Telehealth: Payer: Self-pay | Admitting: Family Medicine

## 2018-08-10 MED ORDER — ALBUTEROL SULFATE HFA 108 (90 BASE) MCG/ACT IN AERS: 2.0000 | INHALATION_SPRAY | Freq: Four times a day (QID) | RESPIRATORY_TRACT | 2 refills | Status: DC | PRN

## 2018-08-10 NOTE — Telephone Encounter (Signed)
Medication filled to pharmacy as requested.   

## 2018-08-10 NOTE — Telephone Encounter (Signed)
Pt states that she needs a refill on albuterol, [facility] [address] In [place]

## 2018-08-10 NOTE — Telephone Encounter (Signed)
Called patient and inquired ho she is doing, did she have any needs. I advised her that due to current COVID 19 pandemic, our office is severely reducing in person visits in order to minimize the risk to our patients and healthcare providers. However if she would like to be seen sooner that Aug we will convert her visit to video and reschedule. She stated her headaches are under control. She would like to keep August FU. She  verbalized understanding, appreciation of call.

## 2018-08-10 NOTE — Addendum Note (Signed)
Addended by: Davis Gourd on: 08/10/2018 11:36 AM   Modules accepted: Orders

## 2018-08-17 ENCOUNTER — Ambulatory Visit: Payer: Medicare Other | Admitting: Family Medicine

## 2018-08-17 ENCOUNTER — Ambulatory Visit: Payer: Medicare Other | Admitting: Sports Medicine

## 2018-08-25 ENCOUNTER — Ambulatory Visit: Payer: Medicare Other | Admitting: Family Medicine

## 2018-08-25 ENCOUNTER — Telehealth: Payer: Self-pay | Admitting: Family Medicine

## 2018-08-25 NOTE — Telephone Encounter (Signed)
Pt states that she will CB to schedule follow up appt, she was not at home when I called her.

## 2018-08-30 ENCOUNTER — Encounter: Payer: Self-pay | Admitting: Family Medicine

## 2018-08-30 ENCOUNTER — Other Ambulatory Visit: Payer: Self-pay

## 2018-08-30 ENCOUNTER — Ambulatory Visit (INDEPENDENT_AMBULATORY_CARE_PROVIDER_SITE_OTHER): Payer: Medicare Other | Admitting: Family Medicine

## 2018-08-30 VITALS — Ht 61.0 in | Wt 109.0 lb

## 2018-08-30 DIAGNOSIS — I10 Essential (primary) hypertension: Secondary | ICD-10-CM | POA: Diagnosis not present

## 2018-08-30 DIAGNOSIS — E785 Hyperlipidemia, unspecified: Secondary | ICD-10-CM

## 2018-08-30 DIAGNOSIS — F331 Major depressive disorder, recurrent, moderate: Secondary | ICD-10-CM

## 2018-08-30 NOTE — Progress Notes (Signed)
Virtual Visit via Video   I connected with patient on 08/30/18 at 10:40 AM EDT by a video enabled telemedicine application and verified that I am speaking with the correct person using two identifiers.  Location patient: Home Location provider: Acupuncturist, Office Persons participating in the virtual visit: Patient, Provider, [name] [name]  I discussed the limitations of evaluation and management by telemedicine and the availability of in person appointments. The patient expressed understanding and agreed to proceed.  Subjective:   HPI:   HTN- chronic problem, on Propranolol 74m BID and Triamterene HCTZ 37.5/25 daily.  No CP, SOB, HAs, visual changes, edema.  Hyperlipidemia- chronic problem, on Atorvastatin 29mdaily.  Continues to walk daily for 1 hr.  Denies abd pain, N/V.  Depression- chronic problem.  Following w/ Psych and on multiple meds.  Pt reports at onset of COVID it 'was really really hard'.  Feels she is adjusting better and doing well at this time.    ROS:   See pertinent positives and negatives per HPI.  Patient Active Problem List   Diagnosis Date Noted  . Skin cancer   . Osteopenia after menopause   . Chronic right shoulder pain 03/30/2017  . Right shoulder pain 01/12/2017  . Tendinopathy of rotator cuff 12/20/2016  . Angle-closure glaucoma, severe stage 08/19/2015  . Osteopenia 06/19/2015  . Migraine without aura and without status migrainosus, not intractable 01/13/2015  . High risk medications (not anticoagulants) long-term use 09/26/2014  . Breast signs and symptoms 02/08/2014  . Routine general medical examination at a health care facility 07/27/2012  . Hyperlipidemia 09/29/2010  . URETEROPELVIC JUNCTION OBSTRUCTION, CONGENITAL 12/15/2009  . CONSTIPATION 12/11/2009  . CARDIAC MURMUR 11/26/2008  . MAGNETIC RESONANCE IMAGING, BRAIN, ABNORMAL 10/29/2008  . VENEREAL WART 10/23/2008  . Major depressive disorder, recurrent episode ([place] 10/23/2008  . COMMON MIGRAINE 10/23/2008  . Essential hypertension 10/23/2008  . ALLERGIC RHINITIS 10/23/2008  . ASTHMA, INTERMITTENT, MILD 10/23/2008  . RENAL CALCULUS, HX OF 10/23/2008    Social History   Tobacco Use  . Smoking status: Never Smoker  . Smokeless tobacco: Never Used  Substance Use Topics  . Alcohol use: No    Alcohol/week: 0.0 standard drinks    Comment: rare    Current Outpatient Medications:  .  albuterol (VENTOLIN HFA) 108 (90 Base) MCG/ACT inhaler, Inhale 2 puffs into the lungs every 6 (six) hours as needed for wheezing or shortness of breath., Disp: 1 Inhaler, Rfl: 2 .  atorvastatin (LIPITOR) 20 MG tablet, TAKE 1 TABLET BY MOUTH EVERY DAY, Disp: 30 tablet, Rfl: 6 .  Calcium-Vitamin D-Vitamin K (CALCIUM + D + K PO), Take 3 each by mouth daily. Calcium 50079mVitamin D 1000iu- K 5m54mDisp: , Rfl:  .  clonazePAM (KLONOPIN) 0.5 MG tablet, Take 0.25 mg by mouth at bedtime as needed. , Disp: , Rfl: 2 .  ELDERBERRY PO, Take by mouth., Disp: , Rfl:  .  escitalopram (LEXAPRO) 10 MG tablet, Take 10 mg by mouth daily. , Disp: , Rfl:  .  Fluocinolone-Emollient (FLUOCINOLONE CREAM & EMOLLIENT EX), Apply topically., Disp: , Rfl:  .  gabapentin (NEURONTIN) 100 MG capsule, Take 1-3 capsules (100-300 mg total) by mouth 2 (two) times daily., Disp: 180 capsule, Rfl: 12 .  Glucosamine-Chondroitin (MOVE FREE PO), Take 1 tablet by mouth daily., Disp: , Rfl:  .  ibuprofen (ADVIL,MOTRIN) 200 MG tablet, Take 200 mg by mouth every 6 (six) hours as needed., Disp: , Rfl:  .  Ketoconazole-Hydrocortisone 2 & 1 % KIT, Apply topically as needed. Cracks on mouth, Disp: , Rfl:  .  lamoTRIgine (LAMICTAL) 100 MG tablet, TAKE 19m in the morning and 100 mg by mouth before bed., Disp: , Rfl:  .  Melatonin 3 MG CAPS, Take 1 capsule by mouth at bedtime., Disp: , Rfl:  .  meloxicam (MOBIC) 15 MG tablet, TAKE 1 TABLET BY MOUTH EVERY DAY, Disp: 30 tablet, Rfl: 1 .  Naproxen Sodium (ALEVE) 220 MG CAPS,  As needed for headache, Disp: 60 each, Rfl:  .  NEOMYCIN-POLYMYXIN-HYDROCORTISONE (CORTISPORIN) 1 % SOLN OTIC solution, Apply 1-2 drops to toe bid after soaking, Disp: 10 mL, Rfl: 1 .  ondansetron (ZOFRAN) 4 MG tablet, Take 1 tablet (4 mg total) by mouth every 8 (eight) hours as needed for nausea or vomiting., Disp: 20 tablet, Rfl: 0 .  propranolol (INDERAL) 40 MG tablet, Take 1 tablet (40 mg total) by mouth 2 (two) times daily., Disp: 180 tablet, Rfl: 4 .  QUEtiapine (SEROQUEL XR) 50 MG TB24 24 hr tablet, Take 100 mg by mouth at bedtime. , Disp: , Rfl:  .  traZODone (DESYREL) 50 MG tablet, Take 50 mg by mouth at bedtime as needed for sleep. , Disp: , Rfl:  .  triamterene-hydrochlorothiazide (MAXZIDE-25) 37.5-25 MG tablet, TAKE 1 TABLET BY MOUTH EVERY DAY, Disp: 30 tablet, Rfl: 6  No Known Allergies  Objective:   Ht 5' 1"  (1.549 m)   Wt 109 lb (49.4 kg)   BMI 20.60 kg/m   AAOx3, NAD NCAT, EOMI No obvious CN deficits Coloring WNL Pt is able to speak clearly, coherently without shortness of breath or increased work of breathing.  Thought process is linear.  Mood is appropriate.   Assessment and Plan:   HTN- chronic problem.  No way to check BP today.  Asymptomatic.  Check labs.  No anticipated med changes.  Hyperlipidemia- chronic problem, tolerating statin w/o difficulty.  Check labs.  Adjust meds prn   Depression- following w/ psych.  On multiple medications.  Currently stable.  Will follow.   KAnnye Asa MD 08/30/2018

## 2018-08-30 NOTE — Progress Notes (Signed)
I have discussed the procedure for the virtual visit with the patient who has given consent to proceed with assessment and treatment.   Pt unable to obtain vitals.   Jessica L Brodmerkel, CMA     

## 2018-09-01 ENCOUNTER — Other Ambulatory Visit (INDEPENDENT_AMBULATORY_CARE_PROVIDER_SITE_OTHER): Payer: Medicare Other

## 2018-09-01 ENCOUNTER — Other Ambulatory Visit: Payer: Self-pay

## 2018-09-01 DIAGNOSIS — E785 Hyperlipidemia, unspecified: Secondary | ICD-10-CM | POA: Diagnosis not present

## 2018-09-01 DIAGNOSIS — I10 Essential (primary) hypertension: Secondary | ICD-10-CM | POA: Diagnosis not present

## 2018-09-01 LAB — TSH: TSH: 2.02 u[IU]/mL (ref 0.35–4.50)

## 2018-09-01 LAB — HEPATIC FUNCTION PANEL
ALT: 19 U/L (ref 0–35)
AST: 22 U/L (ref 0–37)
Albumin: 4.2 g/dL (ref 3.5–5.2)
Alkaline Phosphatase: 70 U/L (ref 39–117)
Bilirubin, Direct: 0.1 mg/dL (ref 0.0–0.3)
Total Bilirubin: 0.4 mg/dL (ref 0.2–1.2)
Total Protein: 6.8 g/dL (ref 6.0–8.3)

## 2018-09-01 LAB — BASIC METABOLIC PANEL
BUN: 14 mg/dL (ref 6–23)
CO2: 31 mEq/L (ref 19–32)
Calcium: 9.2 mg/dL (ref 8.4–10.5)
Chloride: 100 mEq/L (ref 96–112)
Creatinine, Ser: 0.77 mg/dL (ref 0.40–1.20)
GFR: 75.55 mL/min (ref >60.00)
Glucose, Bld: 75 mg/dL (ref 70–99)
Potassium: 4.1 mEq/L (ref 3.5–5.1)
Sodium: 139 mEq/L (ref 135–145)

## 2018-09-01 LAB — CBC WITH DIFFERENTIAL/PLATELET
Basophils Absolute: 0 10*3/uL (ref 0.0–0.1)
Basophils Relative: 0.6 % (ref 0.0–3.0)
Eosinophils Absolute: 0 10*3/uL (ref 0.0–0.7)
Eosinophils Relative: 0.9 % (ref 0.0–5.0)
HCT: 37.2 % (ref 36.0–46.0)
Hemoglobin: 12.6 g/dL (ref 12.0–15.0)
Lymphocytes Relative: 29.9 % (ref 12.0–46.0)
Lymphs Abs: 1.4 10*3/uL (ref 0.7–4.0)
MCHC: 34 g/dL (ref 30.0–36.0)
MCV: 93 fl (ref 78.0–100.0)
Monocytes Absolute: 0.4 10*3/uL (ref 0.1–1.0)
Monocytes Relative: 7.7 % (ref 3.0–12.0)
Neutro Abs: 2.9 10*3/uL (ref 1.4–7.7)
Neutrophils Relative %: 60.9 % (ref 43.0–77.0)
Platelets: 260 10*3/uL (ref 150.0–400.0)
RBC: 4 Mil/uL (ref 3.87–5.11)
RDW: 12.1 % (ref 11.5–15.5)
WBC: 4.7 10*3/uL (ref 4.0–10.5)

## 2018-09-01 LAB — LIPID PANEL
Cholesterol: 175 mg/dL (ref 0–200)
HDL: 60.2 mg/dL (ref >39.00)
LDL Cholesterol: 101 mg/dL — ABNORMAL HIGH (ref 0–99)
NonHDL: 114.61
Total CHOL/HDL Ratio: 3
Triglycerides: 66 mg/dL (ref 0.0–149.0)
VLDL: 13.2 mg/dL (ref 0.0–40.0)

## 2018-09-05 ENCOUNTER — Other Ambulatory Visit: Payer: Self-pay

## 2018-09-05 ENCOUNTER — Encounter: Payer: Self-pay | Admitting: Family Medicine

## 2018-09-05 ENCOUNTER — Ambulatory Visit (INDEPENDENT_AMBULATORY_CARE_PROVIDER_SITE_OTHER): Payer: Medicare Other | Admitting: Family Medicine

## 2018-09-05 ENCOUNTER — Encounter: Payer: Self-pay | Admitting: General Practice

## 2018-09-05 DIAGNOSIS — M999 Biomechanical lesion, unspecified: Secondary | ICD-10-CM | POA: Insufficient documentation

## 2018-09-05 DIAGNOSIS — G8929 Other chronic pain: Secondary | ICD-10-CM | POA: Diagnosis not present

## 2018-09-05 DIAGNOSIS — M25511 Pain in right shoulder: Secondary | ICD-10-CM | POA: Diagnosis not present

## 2018-09-05 NOTE — Assessment & Plan Note (Signed)
Multifactorial.  Seems to be more muscle imbalances.  Discussed posture and ergonomics.  Mild scapular dyskinesis likely contributing.  Follow-up again in 4 to 6 weeks

## 2018-09-05 NOTE — Progress Notes (Signed)
[facility], [place] [address] Phone: [phone] Subjective:    I'm seeing this patient by the request  of:    CC: Right shoulder pain and neck pain follow-up  MBT:DHRCBULAGT  Theresa Martin is a 63 y.o. female coming in with complaint of right shoulder. Patient was seeing Dr. Paulla Fore. Patient states that she injured her arm last June when picking up a baby carrier. Pain is over the pec and into the right scapula. Was getting adjustments. Does have tingling in the right hand. Does use gabapentin.       Past Medical History:  Diagnosis Date  . Allergy   . Anxiety   . Depression   . Hyperlipidemia   . Hypertension   . Kidney stones   . Osteopenia after menopause   . Persistent headaches   . Pneumonia   . Postherpetic neuralgia   . Shingles outbreak 02/2014  . Skin cancer    moles on right groin and right buttock.   Past Surgical History:  Procedure Laterality Date  . CARDIOVASCULAR STRESS TEST  02/2009   treadmill stress test: Low risk  . [place]  . COLONOSCOPY     2014/2015 [place], normal  . EXPLORATORY LAPAROTOMY     laproscopy for infertility  . EYE SURGERY Bilateral    laser-correct opening b/tn cornea and iris  . MOLE REMOVAL  2017   x 2 moles   Social History   Socioeconomic History  . Marital status: Legally Separated    Spouse name: Not on file  . Number of children: 2  . Years of education: Not on file  . Highest education level: Not on file  Occupational History  . Occupation: Control and instrumentation engineer  Social Needs  . Financial resource strain: Not on file  . Food insecurity:    Worry: Not on file    Inability: Not on file  . Transportation needs:    Medical: Not on file    Non-medical: Not on file  Tobacco Use  . Smoking status: Never Smoker  . Smokeless tobacco: Never Used  Substance and Sexual Activity  . Alcohol use: No    Alcohol/week: 0.0 standard drinks   Comment: rare  . Drug use: No  . Sexual activity: Not Currently  Lifestyle  . Physical activity:    Days per week: Not on file    Minutes per session: Not on file  . Stress: Not on file  Relationships  . Social connections:    Talks on phone: Not on file    Gets together: Not on file    Attends religious service: Not on file    Active member of club or organization: Not on file    Attends meetings of clubs or organizations: Not on file    Relationship status: Not on file  Other Topics Concern  . Not on file  Social History Narrative   Regular exercise- yes, spins daily   Diet: fruits and veggies, water    No caffeine   No Known Allergies Family History  Problem Relation Age of Onset  . Hypertension Mother   . Coronary artery disease Mother   . Heart failure Mother   . Hypertension Father   . Liver disease Sister        liver failure  . Arrhythmia Brother   . Hypertension Brother   . Cancer Neg Hx        colon   .  Breast cancer Neg Hx      Current Outpatient Medications (Cardiovascular):  .  atorvastatin (LIPITOR) 20 MG tablet, TAKE 1 TABLET BY MOUTH EVERY DAY .  propranolol (INDERAL) 40 MG tablet, Take 1 tablet (40 mg total) by mouth 2 (two) times daily. Marland Kitchen  triamterene-hydrochlorothiazide (MAXZIDE-25) 37.5-25 MG tablet, TAKE 1 TABLET BY MOUTH EVERY DAY  Current Outpatient Medications (Respiratory):  .  albuterol (VENTOLIN HFA) 108 (90 Base) MCG/ACT inhaler, Inhale 2 puffs into the lungs every 6 (six) hours as needed for wheezing or shortness of breath.  Current Outpatient Medications (Analgesics):  .  ibuprofen (ADVIL,MOTRIN) 200 MG tablet, Take 200 mg by mouth every 6 (six) hours as needed. .  meloxicam (MOBIC) 15 MG tablet, TAKE 1 TABLET BY MOUTH EVERY DAY .  Naproxen Sodium (ALEVE) 220 MG CAPS, As needed for headache   Current Outpatient Medications (Other):  Marland Kitchen  Calcium-Vitamin D-Vitamin K (CALCIUM + D + K PO), Take 3 each by mouth daily. Calcium 552m-  Vitamin D 1000iu- K 458m .  clonazePAM (KLONOPIN) 0.5 MG tablet, Take 0.25 mg by mouth at bedtime as needed.  . Marland KitchenELDERBERRY PO, Take by mouth. .  escitalopram (LEXAPRO) 10 MG tablet, Take 10 mg by mouth daily.  .  Fluocinolone-Emollient (FLUOCINOLONE CREAM & EMOLLIENT EX), Apply topically. .  gabapentin (NEURONTIN) 100 MG capsule, Take 1-3 capsules (100-300 mg total) by mouth 2 (two) times daily. .  Glucosamine-Chondroitin (MOVE FREE PO), Take 1 tablet by mouth daily. . Marland KitchenKetoconazole-Hydrocortisone 2 & 1 % KIT, Apply topically as needed. Cracks on mouth .  lamoTRIgine (LAMICTAL) 100 MG tablet, TAKE 10055mn the morning and 100 mg by mouth before bed. .  Melatonin 3 MG CAPS, Take 1 capsule by mouth at bedtime. .  NEOMYCIN-POLYMYXIN-HYDROCORTISONE (CORTISPORIN) 1 % SOLN OTIC solution, Apply 1-2 drops to toe bid after soaking .  ondansetron (ZOFRAN) 4 MG tablet, Take 1 tablet (4 mg total) by mouth every 8 (eight) hours as needed for nausea or vomiting. .  Marland KitchenUEtiapine (SEROQUEL XR) 50 MG TB24 24 hr tablet, Take 100 mg by mouth at bedtime.  .  traZODone (DESYREL) 50 MG tablet, Take 50 mg by mouth at bedtime as needed for sleep.     Past medical history, social, surgical and family history all reviewed in electronic medical record.  No pertanent information unless stated regarding to the chief complaint.   Review of Systems:  No headache, visual changes, nausea, vomiting, diarrhea, constipation, dizziness, abdominal pain, skin rash, fevers, chills, night sweats, weight loss, swollen lymph nodes, body aches, joint swelling, muscle aches, chest pain, shortness of breath, mood changes.   Objective  Blood pressure 120/62, pulse (!) 44, height 5' 1"  (1.549 m), weight 111 lb (50.3 kg), SpO2 98 %. Systems examined below as of    General: No apparent distress alert and oriented x3 mood and affect normal, dressed appropriately.  HEENT: Pupils equal, extraocular movements intact  Respiratory: Patient's  speak in full sentences and does not appear short of breath  Cardiovascular: No lower extremity edema, non tender, no erythema  Skin: Warm dry intact with no signs of infection or rash on extremities or on axial skeleton.  Abdomen: Soft nontender  Neuro: Cranial nerves II through XII are intact, neurovascularly intact in all extremities with 2+ DTRs and 2+ pulses.  Lymph: No lymphadenopathy of posterior or anterior cervical chain or axillae bilaterally.  Gait normal with good balance and coordination.  MSK:  Non tender with full  range of motion and good stability and symmetric strength and tone of shoulders, elbows, wrist, hip, knee and ankles bilaterally.  Neck exam does have some mild loss of lordosis.  Patient does have some mild crepitus.  Negative Spurling's.  Tightness in the paraspinal musculature and trapezius area right greater than left  Osteopathic findings C2 flexed rotated and side bent right T3 extended rotated and side bent right inhaled third rib T6 extended rotated and side bent left L2 flexed rotated and side bent right Sacrum right on right    Impression and Recommendations:     This case required medical decision making of moderate complexity. The above documentation has been reviewed and is accurate and complete Lyndal Pulley, DO       Note: This dictation was prepared with Dragon dictation along with smaller phrase technology. Any transcriptional errors that result from this process are unintentional.

## 2018-09-05 NOTE — Patient Instructions (Signed)
Good to see you. Vitamin D 2000 IU daily  See me again in 4 weeks

## 2018-09-05 NOTE — Assessment & Plan Note (Signed)
Decision today to treat with OMT was based on Physical Exam  After verbal consent patient was treated with HVLA, ME, FPR techniques in cervical, thoracic, rib lumbar and sacral areas  Patient tolerated the procedure well with improvement in symptoms  Patient given exercises, stretches and lifestyle modifications  See medications in patient instructions if given  Patient will follow up in 4 weeks

## 2018-09-13 ENCOUNTER — Other Ambulatory Visit: Payer: Self-pay

## 2018-09-13 ENCOUNTER — Encounter: Payer: Self-pay | Admitting: Podiatry

## 2018-09-13 ENCOUNTER — Ambulatory Visit (INDEPENDENT_AMBULATORY_CARE_PROVIDER_SITE_OTHER): Payer: Medicare Other | Admitting: Podiatry

## 2018-09-13 VITALS — Temp 97.2°F

## 2018-09-13 DIAGNOSIS — M7751 Other enthesopathy of right foot: Secondary | ICD-10-CM | POA: Diagnosis not present

## 2018-09-13 DIAGNOSIS — G5761 Lesion of plantar nerve, right lower limb: Secondary | ICD-10-CM | POA: Diagnosis not present

## 2018-09-13 DIAGNOSIS — Q828 Other specified congenital malformations of skin: Secondary | ICD-10-CM

## 2018-09-13 DIAGNOSIS — G5781 Other specified mononeuropathies of right lower limb: Secondary | ICD-10-CM

## 2018-09-13 NOTE — Progress Notes (Signed)
She presents today chief complaint of painful neuroma third interdigital space of the right foot previously injected was doing fine but is started to hurt now.  Also have that painful callus beneath the fifth metatarsal of the right foot.  Objective: Vital signs are stable she is alert and oriented x3.  Sub-fifth metatarsal does demonstrate a palpable bursitis and a very small porokeratotic lesion.  She also has pain and a palpable Mulder's click to the third interdigital space of the right foot.  Assessment: Neuroma third interdigital space right bursitis sub-fifth metatarsal head right and porokeratotic lesion right.  Plan: Debrided reactive hyperkeratotic lesion I injected the bursa sub-leisurely fifth met head right.  2 mg of dexamethasone was injected after area was cleaned with alcohol.  I also injected the neuroma with dehydrated alcohol after sterile Betadine skin prep 2 cc 4% dehydrated alcohol and I will follow-up with her in 3 weeks.

## 2018-09-18 ENCOUNTER — Ambulatory Visit: Payer: Medicare Other | Admitting: Podiatry

## 2018-09-27 ENCOUNTER — Other Ambulatory Visit: Payer: Medicare Other

## 2018-09-27 ENCOUNTER — Other Ambulatory Visit: Payer: Self-pay

## 2018-09-27 ENCOUNTER — Ambulatory Visit (INDEPENDENT_AMBULATORY_CARE_PROVIDER_SITE_OTHER): Payer: Medicare Other | Admitting: Physician Assistant

## 2018-09-27 ENCOUNTER — Encounter: Payer: Self-pay | Admitting: Physician Assistant

## 2018-09-27 ENCOUNTER — Telehealth: Payer: Self-pay | Admitting: *Deleted

## 2018-09-27 ENCOUNTER — Telehealth: Payer: Self-pay | Admitting: Physician Assistant

## 2018-09-27 VITALS — Wt 109.0 lb

## 2018-09-27 DIAGNOSIS — Z20822 Contact with and (suspected) exposure to covid-19: Secondary | ICD-10-CM

## 2018-09-27 DIAGNOSIS — I639 Cerebral infarction, unspecified: Secondary | ICD-10-CM | POA: Diagnosis not present

## 2018-09-27 DIAGNOSIS — J4530 Mild persistent asthma, uncomplicated: Secondary | ICD-10-CM | POA: Diagnosis not present

## 2018-09-27 DIAGNOSIS — J310 Chronic rhinitis: Secondary | ICD-10-CM

## 2018-09-27 DIAGNOSIS — R6889 Other general symptoms and signs: Secondary | ICD-10-CM | POA: Diagnosis not present

## 2018-09-27 MED ORDER — BECLOMETHASONE DIPROP HFA 80 MCG/ACT IN AERB: 1.0000 | INHALATION_SPRAY | Freq: Two times a day (BID) | RESPIRATORY_TRACT | 0 refills | Status: DC

## 2018-09-27 MED ORDER — MONTELUKAST SODIUM 10 MG PO TABS: 10.0000 mg | ORAL_TABLET | Freq: Every day | ORAL | 3 refills | Status: DC

## 2018-09-27 NOTE — Telephone Encounter (Signed)
-----   Message from Leonidas Romberg, [place] sent at 09/27/2018 10:51 AM EDT ----- Name: Theresa Martin DOB: 1955-05-14 MRN: [id number] Reason for testing: Shortness of breath Insurance: Medicare Policy#: [id number]  Valid Ordering Provider: Brunetta Jeans, PA-C

## 2018-09-27 NOTE — Telephone Encounter (Signed)
Spoke with patient.  Scheduled her for testing today at [facility] at 11:45 am.  Testing protocol reviewed.

## 2018-09-27 NOTE — Telephone Encounter (Signed)
Pt has a doxy appt this morning at 10AM. She would like to come in and have her ears cleaned out she thinks that that is contributing to her ear pain. Can we change this appt to an in office visit?

## 2018-09-27 NOTE — Progress Notes (Signed)
I have discussed the procedure for the virtual visit with the patient who has given consent to proceed with assessment and treatment.   Marypat Kimmet S Jaxan Michel, CMA     

## 2018-09-27 NOTE — Progress Notes (Signed)
Virtual Visit via Video   I connected with patient on 09/27/18 at 10:00 AM EDT by a video enabled telemedicine application and verified that I am speaking with the correct person using two identifiers.  Location patient: Home Location provider: [facility], Office Persons participating in the virtual visit: Patient, Provider, PA-Student Anibal Henderson), CMA (Eduard Clos)  I discussed the limitations of evaluation and management by telemedicine and the availability of in person appointments. The patient expressed understanding and agreed to proceed.  Subjective:   HPI:   Patient endorses that her R ear stopped up, decreased hearing started several days ago. Patient endorses sinus pressure started a few wks ago. Denies sinus pain. Denies cough, HA, fever or chills. Notes chronic runny nose with sneezing and PND over the past several months. Occasional chest tightness/ heaviness. Albuterol helps, increased SOB recently, reports hearing an wheeze sometimes. Humidity has a large effect, [place] just returned this week    ROS:   See pertinent positives and negatives per HPI.  Patient Active Problem List   Diagnosis Date Noted   Nonallopathic lesion of thoracic region 09/05/2018   Nonallopathic lesion of rib cage 09/05/2018   Nonallopathic lesion of cervical region 09/05/2018   Nonallopathic lesion of lumbosacral region 09/05/2018   Nonallopathic lesion of sacral region 09/05/2018   Skin cancer    Osteopenia after menopause    Chronic right shoulder pain 03/30/2017   Right shoulder pain 01/12/2017   Tendinopathy of rotator cuff 12/20/2016   Angle-closure glaucoma, severe stage 08/19/2015   Osteopenia 06/19/2015   Migraine without aura and without status migrainosus, not intractable 01/13/2015   High risk medications (not anticoagulants) long-term use 09/26/2014   Breast signs and symptoms 02/08/2014   Routine general medical examination at a  health care facility 07/27/2012   Hyperlipidemia 09/29/2010   URETEROPELVIC JUNCTION OBSTRUCTION, CONGENITAL 12/15/2009   CONSTIPATION 12/11/2009   CARDIAC MURMUR 11/26/2008   MAGNETIC RESONANCE IMAGING, BRAIN, ABNORMAL 10/29/2008   VENEREAL WART 10/23/2008   Major depressive disorder, recurrent episode ([place]) 10/23/2008   COMMON MIGRAINE 10/23/2008   Essential hypertension 10/23/2008   ALLERGIC RHINITIS 10/23/2008   ASTHMA, INTERMITTENT, MILD 10/23/2008   RENAL CALCULUS, HX OF 10/23/2008    Social History   Tobacco Use   Smoking status: Never Smoker   Smokeless tobacco: Never Used  Substance Use Topics   Alcohol use: No    Alcohol/week: 0.0 standard drinks    Comment: rare    Current Outpatient Medications:    albuterol (VENTOLIN HFA) 108 (90 Base) MCG/ACT inhaler, Inhale 2 puffs into the lungs every 6 (six) hours as needed for wheezing or shortness of breath., Disp: 1 Inhaler, Rfl: 2   atorvastatin (LIPITOR) 20 MG tablet, TAKE 1 TABLET BY MOUTH EVERY DAY, Disp: 30 tablet, Rfl: 6   Calcium-Vitamin D-Vitamin K (CALCIUM + D + K PO), Take 3 each by mouth daily. Calcium 531m- Vitamin D 1000iu- K 450m, Disp: , Rfl:    clonazePAM (KLONOPIN) 0.5 MG tablet, Take 0.25 mg by mouth at bedtime as needed. , Disp: , Rfl: 2   ELDERBERRY PO, Take by mouth., Disp: , Rfl:    escitalopram (LEXAPRO) 10 MG tablet, Take 10 mg by mouth daily. , Disp: , Rfl:    Fluocinolone-Emollient (FLUOCINOLONE CREAM & EMOLLIENT EX), Apply topically., Disp: , Rfl:    gabapentin (NEURONTIN) 100 MG capsule, Take 1-3 capsules (100-300 mg total) by mouth 2 (two) times daily., Disp: 180 capsule, Rfl: 12  Glucosamine-Chondroitin (MOVE FREE PO), Take 1 tablet by mouth daily., Disp: , Rfl:    ibuprofen (ADVIL,MOTRIN) 200 MG tablet, Take 200 mg by mouth every 6 (six) hours as needed., Disp: , Rfl:    Ketoconazole-Hydrocortisone 2 & 1 % KIT, Apply topically as needed. Cracks on mouth, Disp: , Rfl:      lamoTRIgine (LAMICTAL) 100 MG tablet, TAKE 123m in the morning and 100 mg by mouth before bed., Disp: , Rfl:    Melatonin 3 MG CAPS, Take 1 capsule by mouth at bedtime., Disp: , Rfl:    meloxicam (MOBIC) 15 MG tablet, TAKE 1 TABLET BY MOUTH EVERY DAY, Disp: 30 tablet, Rfl: 1   Naproxen Sodium (ALEVE) 220 MG CAPS, As needed for headache, Disp: 60 each, Rfl:    NEOMYCIN-POLYMYXIN-HYDROCORTISONE (CORTISPORIN) 1 % SOLN OTIC solution, Apply 1-2 drops to toe bid after soaking, Disp: 10 mL, Rfl: 1   ondansetron (ZOFRAN) 4 MG tablet, Take 1 tablet (4 mg total) by mouth every 8 (eight) hours as needed for nausea or vomiting., Disp: 20 tablet, Rfl: 0   propranolol (INDERAL) 40 MG tablet, Take 1 tablet (40 mg total) by mouth 2 (two) times daily., Disp: 180 tablet, Rfl: 4   QUEtiapine (SEROQUEL XR) 50 MG TB24 24 hr tablet, Take 50 mg by mouth at bedtime. , Disp: , Rfl:    traZODone (DESYREL) 50 MG tablet, Take 50 mg by mouth at bedtime as needed for sleep. , Disp: , Rfl:    triamterene-hydrochlorothiazide (MAXZIDE-25) 37.5-25 MG tablet, TAKE 1 TABLET BY MOUTH EVERY DAY, Disp: 30 tablet, Rfl: 6  No Known Allergies  Objective:   Wt 109 lb (49.4 kg)    BMI 20.60 kg/m   Patient is well-developed, well-nourished in no acute distress.  Resting comfortably at home.  Head is normocephalic, atraumatic.  No labored breathing.  Speech is clear and coherent with logical contest.  Patient is alert and oriented at baseline.   Assessment and Plan:   1. Chronic rhinitis Start Xyzal. Begin Singulair. Begin Flonase. OTC medications and supportive measures reviewed with patient. If not improving will need in-office assessment.    2. Mild persistentextrinsic asthma without complication Qvar to use daily for now until Singulair gets into system. Albuterol PRN. Strict return precautions reviewed.     WLeeanne Rio PA-C 09/27/2018

## 2018-09-27 NOTE — Telephone Encounter (Signed)
As noted in Skype message we will need to do video visit first as she is scheduled for URI symptoms including ear pressure, pain and sore throat. If we determine no concern for COVID we can bring her in for ear canal assessment/cleaning.

## 2018-09-27 NOTE — Telephone Encounter (Signed)
Pt has been made aware. 

## 2018-09-29 ENCOUNTER — Inpatient Hospital Stay
Admission: EM | Admit: 2018-09-29 | Discharge: 2018-10-01 | DRG: 202 | Disposition: A | Discharge status: Home / Self Care | Payer: Medicare Other | Attending: Internal Medicine | Admitting: Internal Medicine

## 2018-09-29 ENCOUNTER — Emergency Department: Payer: Medicare Other

## 2018-09-29 ENCOUNTER — Encounter: Payer: Self-pay | Admitting: Emergency Medicine

## 2018-09-29 ENCOUNTER — Other Ambulatory Visit: Payer: Self-pay

## 2018-09-29 DIAGNOSIS — R079 Chest pain, unspecified: Secondary | ICD-10-CM | POA: Diagnosis not present

## 2018-09-29 DIAGNOSIS — R200 Anesthesia of skin: Secondary | ICD-10-CM | POA: Diagnosis not present

## 2018-09-29 DIAGNOSIS — R531 Weakness: Secondary | ICD-10-CM | POA: Diagnosis not present

## 2018-09-29 DIAGNOSIS — J45901 Unspecified asthma with (acute) exacerbation: Principal | ICD-10-CM | POA: Diagnosis present

## 2018-09-29 DIAGNOSIS — I119 Hypertensive heart disease without heart failure: Secondary | ICD-10-CM | POA: Diagnosis present

## 2018-09-29 DIAGNOSIS — G500 Trigeminal neuralgia: Secondary | ICD-10-CM | POA: Diagnosis present

## 2018-09-29 DIAGNOSIS — J189 Pneumonia, unspecified organism: Secondary | ICD-10-CM | POA: Diagnosis not present

## 2018-09-29 DIAGNOSIS — G379 Demyelinating disease of central nervous system, unspecified: Secondary | ICD-10-CM | POA: Diagnosis present

## 2018-09-29 DIAGNOSIS — R202 Paresthesia of skin: Secondary | ICD-10-CM | POA: Diagnosis present

## 2018-09-29 DIAGNOSIS — I639 Cerebral infarction, unspecified: Secondary | ICD-10-CM | POA: Diagnosis not present

## 2018-09-29 DIAGNOSIS — I340 Nonrheumatic mitral (valve) insufficiency: Secondary | ICD-10-CM | POA: Diagnosis not present

## 2018-09-29 DIAGNOSIS — E876 Hypokalemia: Secondary | ICD-10-CM | POA: Diagnosis present

## 2018-09-29 DIAGNOSIS — G459 Transient cerebral ischemic attack, unspecified: Secondary | ICD-10-CM

## 2018-09-29 DIAGNOSIS — B0229 Other postherpetic nervous system involvement: Secondary | ICD-10-CM | POA: Diagnosis present

## 2018-09-29 DIAGNOSIS — E785 Hyperlipidemia, unspecified: Secondary | ICD-10-CM | POA: Diagnosis present

## 2018-09-29 DIAGNOSIS — Z85828 Personal history of other malignant neoplasm of skin: Secondary | ICD-10-CM

## 2018-09-29 DIAGNOSIS — Z79899 Other long term (current) drug therapy: Secondary | ICD-10-CM | POA: Diagnosis not present

## 2018-09-29 DIAGNOSIS — E860 Dehydration: Secondary | ICD-10-CM | POA: Diagnosis present

## 2018-09-29 DIAGNOSIS — I6523 Occlusion and stenosis of bilateral carotid arteries: Secondary | ICD-10-CM | POA: Diagnosis not present

## 2018-09-29 DIAGNOSIS — F418 Other specified anxiety disorders: Secondary | ICD-10-CM | POA: Diagnosis present

## 2018-09-29 DIAGNOSIS — R51 Headache: Secondary | ICD-10-CM | POA: Diagnosis not present

## 2018-09-29 DIAGNOSIS — M8580 Other specified disorders of bone density and structure, unspecified site: Secondary | ICD-10-CM | POA: Diagnosis present

## 2018-09-29 DIAGNOSIS — Z20828 Contact with and (suspected) exposure to other viral communicable diseases: Secondary | ICD-10-CM | POA: Diagnosis not present

## 2018-09-29 DIAGNOSIS — R0603 Acute respiratory distress: Secondary | ICD-10-CM | POA: Diagnosis present

## 2018-09-29 DIAGNOSIS — Z8249 Family history of ischemic heart disease and other diseases of the circulatory system: Secondary | ICD-10-CM

## 2018-09-29 DIAGNOSIS — Z7951 Long term (current) use of inhaled steroids: Secondary | ICD-10-CM

## 2018-09-29 DIAGNOSIS — G43909 Migraine, unspecified, not intractable, without status migrainosus: Secondary | ICD-10-CM | POA: Diagnosis present

## 2018-09-29 DIAGNOSIS — R29701 NIHSS score 1: Secondary | ICD-10-CM | POA: Diagnosis present

## 2018-09-29 DIAGNOSIS — E871 Hypo-osmolality and hyponatremia: Secondary | ICD-10-CM | POA: Diagnosis not present

## 2018-09-29 DIAGNOSIS — R0602 Shortness of breath: Secondary | ICD-10-CM | POA: Diagnosis not present

## 2018-09-29 HISTORY — DX: Disorder of kidney and ureter, unspecified: N28.9

## 2018-09-29 LAB — DIFFERENTIAL
Abs Immature Granulocytes: 0.01 10*3/uL (ref 0.00–0.07)
Basophils Absolute: 0 10*3/uL (ref 0.0–0.1)
Basophils Relative: 1 %
Eosinophils Absolute: 0.1 10*3/uL (ref 0.0–0.5)
Eosinophils Relative: 1 %
Immature Granulocytes: 0 %
Lymphocytes Relative: 31 %
Lymphs Abs: 1.8 10*3/uL (ref 0.7–4.0)
Monocytes Absolute: 0.5 10*3/uL (ref 0.1–1.0)
Monocytes Relative: 8 %
Neutro Abs: 3.6 10*3/uL (ref 1.7–7.7)
Neutrophils Relative %: 59 %

## 2018-09-29 LAB — COMPREHENSIVE METABOLIC PANEL
ALT: 27 U/L (ref 0–44)
AST: 32 U/L (ref 15–41)
Albumin: 4.4 g/dL (ref 3.5–5.0)
Alkaline Phosphatase: 62 U/L (ref 38–126)
Anion gap: 9 (ref 5–15)
BUN: 18 mg/dL (ref 8–23)
CO2: 29 mmol/L (ref 22–32)
Calcium: 8.7 mg/dL — ABNORMAL LOW (ref 8.9–10.3)
Chloride: 89 mmol/L — ABNORMAL LOW (ref 98–111)
Creatinine, Ser: 0.68 mg/dL (ref 0.44–1.00)
GFR calc Af Amer: >60 mL/min (ref >60)
GFR calc non Af Amer: >60 mL/min (ref >60)
Glucose, Bld: 105 mg/dL — ABNORMAL HIGH (ref 70–99)
Potassium: 3.7 mmol/L (ref 3.5–5.1)
Sodium: 127 mmol/L — ABNORMAL LOW (ref 135–145)
Total Bilirubin: 0.7 mg/dL (ref 0.3–1.2)
Total Protein: 7.6 g/dL (ref 6.5–8.1)

## 2018-09-29 LAB — PROTIME-INR
INR: 0.9 (ref 0.8–1.2)
Prothrombin Time: 12.2 seconds (ref 11.4–15.2)

## 2018-09-29 LAB — CBC
HCT: 37.7 % (ref 36.0–46.0)
Hemoglobin: 12.8 g/dL (ref 12.0–15.0)
MCH: 30.9 pg (ref 26.0–34.0)
MCHC: 34 g/dL (ref 30.0–36.0)
MCV: 91.1 fL (ref 80.0–100.0)
Platelets: 260 10*3/uL (ref 150–400)
RBC: 4.14 MIL/uL (ref 3.87–5.11)
RDW: 11.1 % — ABNORMAL LOW (ref 11.5–15.5)
WBC: 6 10*3/uL (ref 4.0–10.5)
nRBC: 0 % (ref 0.0–0.2)

## 2018-09-29 LAB — APTT: aPTT: 30 seconds (ref 24–36)

## 2018-09-29 LAB — ETHANOL: Alcohol, Ethyl (B): <10 mg/dL (ref <10)

## 2018-09-29 LAB — SARS CORONAVIRUS 2 BY RT PCR (HOSPITAL ORDER, PERFORMED IN ~~LOC~~ HOSPITAL LAB): SARS Coronavirus 2: NEGATIVE

## 2018-09-29 MED ORDER — SODIUM CHLORIDE 0.9 % IV SOLN
INTRAVENOUS | Status: DC
  Administered 2018-09-30 (×2): via INTRAVENOUS

## 2018-09-29 MED ORDER — ASPIRIN 81 MG PO CHEW
324.0000 mg | CHEWABLE_TABLET | Freq: Once | ORAL | Status: AC
  Administered 2018-09-29: 324 mg via ORAL
  Filled 2018-09-29: qty 4

## 2018-09-29 MED ORDER — KETOROLAC TROMETHAMINE 30 MG/ML IJ SOLN
15.0000 mg | Freq: Once | INTRAMUSCULAR | Status: AC
  Administered 2018-09-29: 15 mg via INTRAVENOUS
  Filled 2018-09-29: qty 1

## 2018-09-29 MED ORDER — IPRATROPIUM-ALBUTEROL 0.5-2.5 (3) MG/3ML IN SOLN
3.0000 mL | Freq: Once | RESPIRATORY_TRACT | Status: AC
  Administered 2018-09-29: 3 mL via RESPIRATORY_TRACT
  Filled 2018-09-29: qty 3

## 2018-09-29 MED ORDER — STROKE: EARLY STAGES OF RECOVERY BOOK
Freq: Once | Status: AC
  Administered 2018-09-30: 13:00:00

## 2018-09-29 MED ORDER — GUAIFENESIN ER 600 MG PO TB12
600.0000 mg | ORAL_TABLET | Freq: Two times a day (BID) | ORAL | Status: DC
  Administered 2018-09-30 – 2018-10-01 (×3): 600 mg via ORAL
  Filled 2018-09-29 (×3): qty 1

## 2018-09-29 MED ORDER — LAMOTRIGINE 100 MG PO TABS
100.0000 mg | ORAL_TABLET | Freq: Two times a day (BID) | ORAL | Status: DC
  Administered 2018-09-30 – 2018-10-01 (×3): 100 mg via ORAL
  Filled 2018-09-29 (×3): qty 1

## 2018-09-29 MED ORDER — ACETAMINOPHEN 325 MG PO TABS
650.0000 mg | ORAL_TABLET | ORAL | Status: DC | PRN
  Administered 2018-09-30: 03:00:00 650 mg via ORAL
  Filled 2018-09-29: qty 2

## 2018-09-29 MED ORDER — ESCITALOPRAM OXALATE 10 MG PO TABS
10.0000 mg | ORAL_TABLET | Freq: Every day | ORAL | Status: DC
  Administered 2018-09-30 – 2018-10-01 (×2): 10 mg via ORAL
  Filled 2018-09-29 (×2): qty 1

## 2018-09-29 MED ORDER — SODIUM CHLORIDE 0.9 % IV SOLN
2.0000 g | INTRAVENOUS | Status: DC
  Administered 2018-10-01: 2 g via INTRAVENOUS
  Filled 2018-09-29: qty 2
  Filled 2018-09-29: qty 20

## 2018-09-29 MED ORDER — SENNOSIDES-DOCUSATE SODIUM 8.6-50 MG PO TABS
1.0000 | ORAL_TABLET | Freq: Two times a day (BID) | ORAL | Status: DC
  Administered 2018-10-01: 1 via ORAL
  Filled 2018-09-29 (×3): qty 1

## 2018-09-29 MED ORDER — METHYLPREDNISOLONE SODIUM SUCC 125 MG IJ SOLR
125.0000 mg | Freq: Once | INTRAMUSCULAR | Status: AC
  Administered 2018-09-29: 125 mg via INTRAVENOUS
  Filled 2018-09-29: qty 2

## 2018-09-29 MED ORDER — IPRATROPIUM-ALBUTEROL 0.5-2.5 (3) MG/3ML IN SOLN
3.0000 mL | Freq: Four times a day (QID) | RESPIRATORY_TRACT | Status: DC
  Administered 2018-09-30 – 2018-10-01 (×5): 3 mL via RESPIRATORY_TRACT
  Filled 2018-09-29 (×4): qty 3
  Filled 2018-09-29: qty 6

## 2018-09-29 MED ORDER — ACETAMINOPHEN 160 MG/5ML PO SOLN
650.0000 mg | ORAL | Status: DC | PRN
  Filled 2018-09-29: qty 20.3

## 2018-09-29 MED ORDER — IPRATROPIUM-ALBUTEROL 0.5-2.5 (3) MG/3ML IN SOLN
3.0000 mL | Freq: Once | RESPIRATORY_TRACT | Status: AC
  Administered 2018-09-29: 23:00:00 3 mL via RESPIRATORY_TRACT

## 2018-09-29 MED ORDER — MELATONIN 5 MG PO TABS
5.0000 mg | ORAL_TABLET | Freq: Every day | ORAL | Status: DC
  Administered 2018-09-30 (×2): 5 mg via ORAL
  Filled 2018-09-29 (×3): qty 1

## 2018-09-29 MED ORDER — ENOXAPARIN SODIUM 40 MG/0.4ML ~~LOC~~ SOLN
40.0000 mg | SUBCUTANEOUS | Status: DC
  Administered 2018-09-30 – 2018-10-01 (×2): 40 mg via SUBCUTANEOUS
  Filled 2018-09-29: qty 0.4

## 2018-09-29 MED ORDER — ONDANSETRON HCL 4 MG PO TABS: 4.0000 mg | ORAL_TABLET | Freq: Three times a day (TID) | ORAL | Status: DC | PRN

## 2018-09-29 MED ORDER — PROPRANOLOL HCL 40 MG PO TABS
40.0000 mg | ORAL_TABLET | Freq: Two times a day (BID) | ORAL | Status: DC
  Administered 2018-09-30: 12:00:00 40 mg via ORAL
  Filled 2018-09-29 (×4): qty 1

## 2018-09-29 MED ORDER — SODIUM CHLORIDE 0.9 % IV SOLN
2.0000 g | Freq: Once | INTRAVENOUS | Status: AC
  Administered 2018-09-29: 23:00:00 2 g via INTRAVENOUS
  Filled 2018-09-29: qty 20

## 2018-09-29 MED ORDER — ACETAMINOPHEN 650 MG RE SUPP: 650.0000 mg | RECTAL | Status: DC | PRN

## 2018-09-29 MED ORDER — SODIUM CHLORIDE 0.9 % IV SOLN
500.0000 mg | Freq: Once | INTRAVENOUS | Status: AC
  Administered 2018-09-29: 23:00:00 500 mg via INTRAVENOUS
  Filled 2018-09-29: qty 500

## 2018-09-29 MED ORDER — GABAPENTIN 100 MG PO CAPS: 100.0000 mg | ORAL_CAPSULE | Freq: Two times a day (BID) | ORAL | Status: DC

## 2018-09-29 MED ORDER — CALCIUM-VITAMIN D-VITAMIN K 750-500-40 MG-UNT-MCG PO TABS: ORAL_TABLET | Freq: Every day | ORAL | Status: DC

## 2018-09-29 MED ORDER — SODIUM CHLORIDE 0.9 % IV SOLN
500.0000 mg | INTRAVENOUS | Status: DC
  Administered 2018-10-01: 02:00:00 500 mg via INTRAVENOUS
  Filled 2018-09-29 (×3): qty 500

## 2018-09-29 MED ORDER — ATORVASTATIN CALCIUM 20 MG PO TABS
20.0000 mg | ORAL_TABLET | Freq: Every day | ORAL | Status: DC
  Administered 2018-09-30: 20 mg via ORAL
  Filled 2018-09-29: qty 1

## 2018-09-29 MED ORDER — IPRATROPIUM-ALBUTEROL 0.5-2.5 (3) MG/3ML IN SOLN
3.0000 mL | Freq: Once | RESPIRATORY_TRACT | Status: AC
  Administered 2018-09-30: 3 mL via RESPIRATORY_TRACT
  Filled 2018-09-29: qty 3

## 2018-09-29 MED ORDER — TRIAMTERENE-HCTZ 37.5-25 MG PO TABS: 1.0000 | ORAL_TABLET | Freq: Every day | ORAL | Status: DC

## 2018-09-29 MED ORDER — MONTELUKAST SODIUM 10 MG PO TABS
10.0000 mg | ORAL_TABLET | Freq: Every day | ORAL | Status: DC
  Administered 2018-09-30: 10 mg via ORAL
  Filled 2018-09-29: qty 1

## 2018-09-29 MED ORDER — TRAZODONE HCL 50 MG PO TABS: 25.0000 mg | ORAL_TABLET | Freq: Every evening | ORAL | Status: DC | PRN

## 2018-09-29 MED ORDER — CLONAZEPAM 0.5 MG PO TABS
0.2500 mg | ORAL_TABLET | Freq: Every evening | ORAL | Status: DC | PRN
  Administered 2018-09-30: 04:00:00 0.25 mg via ORAL
  Filled 2018-09-29: qty 1

## 2018-09-29 MED ORDER — QUETIAPINE FUMARATE ER 50 MG PO TB24
50.0000 mg | ORAL_TABLET | Freq: Every day | ORAL | Status: DC
  Administered 2018-09-30: 22:00:00 50 mg via ORAL
  Filled 2018-09-29 (×2): qty 1

## 2018-09-29 MED ORDER — ALUM & MAG HYDROXIDE-SIMETH 200-200-20 MG/5ML PO SUSP: 30.0000 mL | ORAL | Status: DC | PRN

## 2018-09-29 MED ORDER — ONDANSETRON HCL 4 MG/2ML IJ SOLN: 4.0000 mg | INTRAMUSCULAR | Status: DC | PRN

## 2018-09-29 MED ORDER — BECLOMETHASONE DIPROP HFA 80 MCG/ACT IN AERB: 1.0000 | INHALATION_SPRAY | Freq: Two times a day (BID) | RESPIRATORY_TRACT | Status: DC

## 2018-09-29 MED ORDER — ALBUTEROL SULFATE (2.5 MG/3ML) 0.083% IN NEBU
2.5000 mg | INHALATION_SOLUTION | Freq: Four times a day (QID) | RESPIRATORY_TRACT | Status: DC | PRN
  Administered 2018-09-30: 05:00:00 2.5 mg via RESPIRATORY_TRACT
  Filled 2018-09-29: qty 3

## 2018-09-29 NOTE — ED Provider Notes (Addendum)
[facility] Emergency Department Provider Note   ____________________________________________    I have reviewed the triage vital signs and the nursing notes.   HISTORY  Chief Complaint Numbness     HPI Theresa Martin is a 63 y.o. female who presents with complaints of numbness in her left face, left arm and left leg.  She reports this started about 2 hours ago.  She notes that she has been having "heaviness "in her head as well.  Additionally patient reports that she was seen by her PCP 2 days ago and had coronavirus test swab due to cough, fatigue.  Additionally she was prescribed an albuterol inhaler for her asthma and mild shortness of breath.  Denies fevers or chills.  No history of CVA.  Past Medical History:  Diagnosis Date   Allergy    Anxiety    Depression    Hyperlipidemia    Hypertension    Osteopenia after menopause    Persistent headaches    Pneumonia    Postherpetic neuralgia    Renal disorder    Shingles outbreak 02/2014   Skin cancer    moles on right groin and right buttock.    Patient Active Problem List   Diagnosis Date Noted   Nonallopathic lesion of thoracic region 09/05/2018   Nonallopathic lesion of rib cage 09/05/2018   Nonallopathic lesion of cervical region 09/05/2018   Nonallopathic lesion of lumbosacral region 09/05/2018   Nonallopathic lesion of sacral region 09/05/2018   Skin cancer    Osteopenia after menopause    Chronic right shoulder pain 03/30/2017   Right shoulder pain 01/12/2017   Tendinopathy of rotator cuff 12/20/2016   Angle-closure glaucoma, severe stage 08/19/2015   Osteopenia 06/19/2015   Migraine without aura and without status migrainosus, not intractable 01/13/2015   High risk medications (not anticoagulants) long-term use 09/26/2014   Breast signs and symptoms 02/08/2014   Routine general medical examination at a health care facility 07/27/2012    Hyperlipidemia 09/29/2010   URETEROPELVIC JUNCTION OBSTRUCTION, CONGENITAL 12/15/2009   CONSTIPATION 12/11/2009   CARDIAC MURMUR 11/26/2008   MAGNETIC RESONANCE IMAGING, BRAIN, ABNORMAL 10/29/2008   VENEREAL WART 10/23/2008   Major depressive disorder, recurrent episode ([place]) 10/23/2008   COMMON MIGRAINE 10/23/2008   Essential hypertension 10/23/2008   ALLERGIC RHINITIS 10/23/2008   ASTHMA, INTERMITTENT, MILD 10/23/2008   RENAL CALCULUS, HX OF 10/23/2008    Past Surgical History:  Procedure Laterality Date   CARDIOVASCULAR STRESS TEST  02/2009   treadmill stress test: Low risk   [place]     2014/2015 [place], normal   EXPLORATORY LAPAROTOMY     laproscopy for infertility   EYE SURGERY Bilateral    laser-correct opening b/tn cornea and iris   MOLE REMOVAL  2017   x 2 moles    Prior to Admission medications   Medication Sig Start Date End Date Taking? Authorizing Provider  albuterol (VENTOLIN HFA) 108 (90 Base) MCG/ACT inhaler Inhale 2 puffs into the lungs every 6 (six) hours as needed for wheezing or shortness of breath. 08/10/18  Yes [name], MD  atorvastatin (LIPITOR) 20 MG tablet TAKE 1 TABLET BY MOUTH EVERY DAY 04/10/18  Yes [name], MD  beclomethasone (QVAR) 80 MCG/ACT inhaler Inhale 1-2 puffs into the lungs 2 (two) times daily. 09/27/18  Yes Brunetta Jeans, PA-C  Calcium-Vitamin D-Vitamin K (CALCIUM + D + K PO) Take 3 each by mouth daily.  Calcium 556m- Vitamin D 1000iu- K 430m   Yes [provider]  clonazePAM (KLONOPIN) 0.5 MG tablet Take 0.25 mg by mouth at bedtime as needed.  01/12/14  Yes [provider]  ELDERBERRY PO Take by mouth.   Yes [provider]  escitalopram (LEXAPRO) 10 MG tablet Take 10 mg by mouth daily.    Yes [provider]  Fluocinolone-Emollient (FLUOCINOLONE CREAM & EMOLLIENT EX) Apply topically.   Yes [provider]   gabapentin (NEURONTIN) 100 MG capsule Take 1-3 capsules (100-300 mg total) by mouth 2 (two) times daily. 01/30/18  Yes Penumalli, ViEarlean PolkaMD  Glucosamine-Chondroitin (MOVE FREE PO) Take 1 tablet by mouth daily.   Yes [provider]  ibuprofen (ADVIL,MOTRIN) 200 MG tablet Take 200 mg by mouth every 6 (six) hours as needed.   Yes [provider]  Ketoconazole-Hydrocortisone 2 & 1 % KIT Apply topically as needed. Cracks on mouth   Yes [provider]  lamoTRIgine (LAMICTAL) 100 MG tablet TAKE 10021mn the morning and 100 mg by mouth before bed.   Yes [provider]  Melatonin 3 MG CAPS Take 1 capsule by mouth at bedtime.   Yes [provider]  montelukast (SINGULAIR) 10 MG tablet Take 1 tablet (10 mg total) by mouth at bedtime. 09/27/18  Yes MarBrunetta JeansA-C  Naproxen Sodium (ALEVE) 220 MG CAPS As needed for headache 04/15/15  Yes Penumalli, VikEarlean PolkaD  NEOMYCIN-POLYMYXIN-HYDROCORTISONE (CORTISPORIN) 1 % SOLN OTIC solution Apply 1-2 drops to toe bid after soaking 04/03/18  Yes Hyatt, Max T, DPM  ondansetron (ZOFRAN) 4 MG tablet Take 1 tablet (4 mg total) by mouth every 8 (eight) hours as needed for nausea or vomiting. 04/05/18  Yes MarBrunetta JeansA-C  propranolol (INDERAL) 40 MG tablet Take 1 tablet (40 mg total) by mouth 2 (two) times daily. 01/30/18  Yes Penumalli, VikEarlean PolkaD  QUEtiapine (SEROQUEL XR) 50 MG TB24 24 hr tablet Take 50 mg by mouth at bedtime.    Yes [provider]  traZODone (DESYREL) 50 MG tablet Take 50 mg by mouth at bedtime as needed for sleep.  05/29/15  Yes [provider]  triamterene-hydrochlorothiazide (MAXZIDE-25) 37.5-25 MG tablet TAKE 1 TABLET BY MOUTH EVERY DAY 02/08/18  Yes TabMidge MiniumD     Allergies Patient has no known allergies.  Family History  Problem Relation Age of Onset   Hypertension Mother    Coronary artery disease Mother    Heart failure Mother    Hypertension  Father    Liver disease Sister        liver failure   Arrhythmia Brother    Hypertension Brother    Cancer Neg Hx        colon    Breast cancer Neg Hx     Social History Social History   Tobacco Use   Smoking status: Never Smoker   Smokeless tobacco: Never Used  Substance Use Topics   Alcohol use: No    Alcohol/week: 0.0 standard drinks   Drug use: No    Review of Systems  Constitutional: No fever/chills Eyes: No visual changes.  ENT: No sore throat. Cardiovascular: Denies chest pain. Respiratory: As above Gastrointestinal: No abdominal pain.  No nausea, no vomiting.   Genitourinary: Negative for dysuria. Musculoskeletal: Negative for back pain. Skin: Negative for rash. Neurological: As above   ____________________________________________   PHYSICAL EXAM:  VITAL SIGNS: ED Triage Vitals  Enc Vitals Group  BP 09/29/18 2037 (!) 196/92     Pulse Rate 09/29/18 2037 63     Resp 09/29/18 2037 18     Temp 09/29/18 2044 98.8 F (37.1 C)     Temp Source 09/29/18 2044 Oral     SpO2 09/29/18 2037 100 %     Weight 09/29/18 2037 52.8 kg (116 lb 4.8 oz)     Height 09/29/18 2040 1.549 m (5' 1")     Head Circumference --      Peak Flow --      Pain Score 09/29/18 2040 0     Pain Loc --      Pain Edu? --      Excl. in [place]? --     Constitutional: Alert and oriented.  Eyes: Conjunctivae are normal.  PERRLA, EOMI Head: Atraumatic. Nose: No congestion/rhinnorhea. Mouth/Throat: Mucous membranes are moist.   Neck:  Painless ROM Cardiovascular: Normal rate, regular rhythm. Grossly normal heart sounds.  Good peripheral circulation. Respiratory: Mild tachypnea.  No retractions.  Diffuse wheezing Gastrointestinal: Soft and nontender. No distention.  No CVA tenderness. Genitourinary: deferred Musculoskeletal: No lower extremity tenderness nor edema.  Warm and well perfused.  Normal strength in all extremities Neurologic:  Normal speech and language. No gross  focal neurologic deficits are appreciated.  No nerves II to XII are normal.  NIH stroke scale of 1 Skin:  Skin is warm, dry and intact. No rash noted. Psychiatric: Mood and affect are normal. Speech and behavior are normal.  ____________________________________________   LABS (all labs ordered are listed, but only abnormal results are displayed)  Labs Reviewed  CBC - Abnormal; Notable for the following components:      Result Value   RDW 11.1 (*)    All other components within normal limits  COMPREHENSIVE METABOLIC PANEL - Abnormal; Notable for the following components:   Sodium 127 (*)    Chloride 89 (*)    Glucose, Bld 105 (*)    Calcium 8.7 (*)    All other components within normal limits  SARS CORONAVIRUS 2 (HOSPITAL ORDER, [place] [facility])  ETHANOL  PROTIME-INR  APTT  DIFFERENTIAL  URINE DRUG SCREEN, QUALITATIVE ([facility] ONLY)  URINALYSIS, ROUTINE W REFLEX MICROSCOPIC   ____________________________________________  EKG  ED ECG REPORT I, Lavonia Drafts, the attending physician, personally viewed and interpreted this ECG.  Date: 09/29/2018  Rhythm: normal sinus rhythm QRS Axis: normal Intervals: normal ST/T Wave abnormalities: Nonspecific changes Narrative Interpretation: no evidence of acute ischemia  ____________________________________________  RADIOLOGY  CT head unremarkable ____________________________________________   PROCEDURES  Procedure(s) performed: No  Procedures   Critical Care performed: yes  CRITICAL CARE Performed by: Lavonia Drafts   Total critical care time:30 minutes  Critical care time was exclusive of separately billable procedures and treating other patients.  Critical care was necessary to treat or prevent imminent or life-threatening deterioration.  Critical care was time spent personally by me on the following activities: development of treatment plan with patient and/or surrogate as well as nursing,  discussions with consultants, evaluation of patient's response to treatment, examination of patient, obtaining history from patient or surrogate, ordering and performing treatments and interventions, ordering and review of laboratory studies, ordering and review of radiographic studies, pulse oximetry and re-evaluation of patient's condition.  ____________________________________________   INITIAL IMPRESSION / ASSESSMENT AND PLAN / ED COURSE  Pertinent labs & imaging results that were available during my care of the patient were reviewed by me and considered in my  medical decision making (see chart for details).  Patient presents with strokelike symptoms within 4-1/2-hour window, code stroke called for consideration of TPA.  Additionally the patient is wheezing significantly on exam, we will give her Solu-Medrol and have her use her inhaler until coronavirus test is returned  CT scan is unremarkable.  Patient seen by tele-neurology who recommends no TPA but aspirin and admission.  Patient improved after steroids and inhaler but is complaining of chest discomfort.  Chest x-ray demonstrates right-sided infiltrate, will start abx  Patient is requiring nasal cannula oxygen, coronavirus negative, will give duo nebs and will admit to the hospitalist service    ____________________________________________   FINAL CLINICAL IMPRESSION(S) / ED DIAGNOSES  Final diagnoses:  Cerebrovascular accident (CVA), unspecified mechanism ([place])  Exacerbation of asthma, unspecified asthma severity, unspecified whether persistent        Note:  This document was prepared using Dragon voice recognition software and may include unintentional dictation errors.   Lavonia Drafts, MD 09/29/18 2213    Lavonia Drafts, MD 09/29/18 0093    Lavonia Drafts, MD 09/29/18 2315

## 2018-09-29 NOTE — ED Notes (Signed)
Patient placed on non-rebreather 15L/min

## 2018-09-29 NOTE — ED Triage Notes (Signed)
Patient ambulatory to stat desk stating that she has felt pressure in her head all day. Patient states that about an hour ago she started developing numbness to left side of face, arm and leg.

## 2018-09-29 NOTE — H&P (Addendum)
[facility] NAME: Theresa Martin    MR#:  [id number]  DATE OF BIRTH:  10/26/1955  DATE OF ADMISSION:  09/29/2018  PRIMARY CARE PHYSICIAN: [name], MD   REQUESTING/REFERRING PHYSICIAN: Lavonia Drafts, MD CHIEF COMPLAINT:   Chief Complaint  Patient presents with   Numbness    HISTORY OF PRESENT ILLNESS:  Theresa Martin  is a 63 y.o. Caucasian female with a known history of multiple medical problems that are mentioned below, who presented to the emergency room with acute onset of left upper and lower extremity numbness without weakness that started earlier today and has been getting better without headache or dizziness or blurred vision dysarthria or dysphagia.  She denies any chest pain or palpitations.  She has been having worsening cough that has been mainly dry over the last couple of days with associated dyspnea and wheezing.  She denied any fever or chills.  Upon presentation to the emergency room temperature was 97.2 and blood pressure was elevated 196/92 otherwise normal vital signs.  Labs revealed hyponatremia 127 and hypokalemia of 89.  COVID-19 test came back negative.  ABG showed pH 7.38, PCO2 42, PO2 of 96 bicarbonate 24.8 and O2 sat of 97% on 100% nonrebreather.  Portable chest x-ray revealed suspected right-sided pneumonia with mild interstitial prominence on the left.  It showed cardiomegaly.  EKG showed normal sinus rhythm with rate of 60 with T wave inversion laterally and poor R wave progression.  Head CT scan revealed no acute intracranial abnormalities.  The patient was given 4 baby aspirin, IV Rocephin and Zithromax as well as 3 duo nebs.  She will be admitted to a medically monitored bed for further evaluation and management.   PAST MEDICAL HISTORY:   Past Medical History:  Diagnosis Date   Allergy    Anxiety    Depression    Hyperlipidemia    Hypertension    Osteopenia after menopause     Persistent headaches    Pneumonia    Postherpetic neuralgia    Renal disorder    Shingles outbreak 02/2014   Skin cancer    moles on right groin and right buttock.  Asthma  PAST SURGICAL HISTORY:   Past Surgical History:  Procedure Laterality Date   CARDIOVASCULAR STRESS TEST  02/2009   treadmill stress test: Low risk   CESAREAN SECTION  1987, 1989   COLONOSCOPY     2014/2015 [place], normal   EXPLORATORY LAPAROTOMY     laproscopy for infertility   EYE SURGERY Bilateral    laser-correct opening b/tn cornea and iris   MOLE REMOVAL  2017   x 2 moles    SOCIAL HISTORY:   Social History   Tobacco Use   Smoking status: Never Smoker   Smokeless tobacco: Never Used  Substance Use Topics   Alcohol use: No    Alcohol/week: 0.0 standard drinks    FAMILY HISTORY:   Family History  Problem Relation Age of Onset   Hypertension Mother    Coronary artery disease Mother    Heart failure Mother    Hypertension Father    Liver disease Sister        liver failure   Arrhythmia Brother    Hypertension Brother    Cancer Neg Hx        colon    Breast cancer Neg Hx     DRUG ALLERGIES:  No Known Allergies  REVIEW OF SYSTEMS:  ROS As per history of present illness. All pertinent systems were reviewed above. Constitutional,  HEENT, cardiovascular, respiratory, GI, GU, musculoskeletal, neuro, psychiatric, endocrine,  integumentary and hematologic systems were reviewed and are otherwise  negative/unremarkable except for positive findings mentioned above in the HPI.   MEDICATIONS AT HOME:   Prior to Admission medications   Medication Sig Start Date End Date Taking? Authorizing Provider  albuterol (VENTOLIN HFA) 108 (90 Base) MCG/ACT inhaler Inhale 2 puffs into the lungs every 6 (six) hours as needed for wheezing or shortness of breath. 08/10/18  Yes [name], MD  atorvastatin (LIPITOR) 20 MG tablet TAKE 1 TABLET BY MOUTH EVERY DAY  04/10/18  Yes [name], MD  beclomethasone (QVAR) 80 MCG/ACT inhaler Inhale 1-2 puffs into the lungs 2 (two) times daily. 09/27/18  Yes Brunetta Jeans, PA-C  Calcium-Vitamin D-Vitamin K (CALCIUM + D + K PO) Take 3 each by mouth daily. Calcium 554m- Vitamin D 1000iu- K 454m   Yes [provider]  clonazePAM (KLONOPIN) 0.5 MG tablet Take 0.25 mg by mouth at bedtime as needed.  01/12/14  Yes [provider]  ELDERBERRY PO Take by mouth.   Yes [provider]  escitalopram (LEXAPRO) 10 MG tablet Take 10 mg by mouth daily.    Yes [provider]  Fluocinolone-Emollient (FLUOCINOLONE CREAM & EMOLLIENT EX) Apply topically.   Yes [provider]  gabapentin (NEURONTIN) 100 MG capsule Take 1-3 capsules (100-300 mg total) by mouth 2 (two) times daily. 01/30/18  Yes Penumalli, ViEarlean PolkaMD  Glucosamine-Chondroitin (MOVE FREE PO) Take 1 tablet by mouth daily.   Yes [provider]  ibuprofen (ADVIL,MOTRIN) 200 MG tablet Take 200 mg by mouth every 6 (six) hours as needed.   Yes [provider]  Ketoconazole-Hydrocortisone 2 & 1 % KIT Apply topically as needed. Cracks on mouth   Yes [provider]  lamoTRIgine (LAMICTAL) 100 MG tablet TAKE 10023mn the morning and 100 mg by mouth before bed.   Yes [provider]  Melatonin 3 MG CAPS Take 1 capsule by mouth at bedtime.   Yes [provider]  montelukast (SINGULAIR) 10 MG tablet Take 1 tablet (10 mg total) by mouth at bedtime. 09/27/18  Yes MarBrunetta JeansA-C  Naproxen Sodium (ALEVE) 220 MG CAPS As needed for headache 04/15/15  Yes Penumalli, VikEarlean PolkaD  NEOMYCIN-POLYMYXIN-HYDROCORTISONE (CORTISPORIN) 1 % SOLN OTIC solution Apply 1-2 drops to toe bid after soaking 04/03/18  Yes Hyatt, Max T, DPM  ondansetron (ZOFRAN) 4 MG tablet Take 1 tablet (4 mg total) by mouth every 8 (eight) hours as needed for nausea or vomiting. 04/05/18  Yes MarBrunetta JeansA-C    propranolol (INDERAL) 40 MG tablet Take 1 tablet (40 mg total) by mouth 2 (two) times daily. 01/30/18  Yes Penumalli, VikEarlean PolkaD  QUEtiapine (SEROQUEL XR) 50 MG TB24 24 hr tablet Take 50 mg by mouth at bedtime.    Yes [provider]  traZODone (DESYREL) 50 MG tablet Take 50 mg by mouth at bedtime as needed for sleep.  05/29/15  Yes [provider]  triamterene-hydrochlorothiazide (MAXZIDE-25) 37.5-25 MG tablet TAKE 1 TABLET BY MOUTH EVERY DAY 02/08/18  Yes TabMidge MiniumD      VITAL SIGNS:  Blood pressure (!) 124/93, pulse 67, temperature 98.8 F (37.1 C), temperature source Oral, resp. rate 14, height _0  (1.549 m), weight 52.8 kg, SpO2 95 %.  PHYSICAL EXAMINATION:  Physical Exam  GENERAL:  63 y.o.-year-old patient lying in the bed with mild to moderate respiratory distress 100% nonrebreather. EYES: Pupils equal, round, reactive to light and accommodation. No scleral icterus. Extraocular muscles intact.  HEENT: Head atraumatic, normocephalic. Oropharynx and nasopharynx clear.  NECK:  Supple, no jugular venous distention. No thyroid enlargement, no tenderness.  LUNGS: Right basal crackles with diffuse expiratory wheezes and tight expiratory airflow with harsh vesicular breathing. CARDIOVASCULAR: Regular rate and rhythm, S1, S2 normal. No murmurs, rubs, or gallops.  ABDOMEN: Soft, nondistended, nontender. Bowel sounds present. No organomegaly or mass.  EXTREMITIES: No pedal edema, cyanosis, or clubbing.  NEUROLOGIC: Cranial nerves II through XII are intact. Muscle strength 5/5 in all extremities.  Normal sensory exam to light touch throughout.  Gait was not checked.  No dysmetria or dysdiadochokinesia.  Normal finger to finger and finger-to-nose test as well as heel-to-shin test. PSYCHIATRIC: The patient is alert and oriented x 3.  Normal affect and good eye contact. SKIN: No obvious rash, lesion, or ulcer.   LABORATORY PANEL:   CBC Recent Labs  Lab  09/29/18 2043  WBC 6.0  HGB 12.8  HCT 37.7  PLT 260   ------------------------------------------------------------------------------------------------------------------  Chemistries  Recent Labs  Lab 09/29/18 2043  NA 127*  K 3.7  CL 89*  CO2 29  GLUCOSE 105*  BUN 18  CREATININE 0.68  CALCIUM 8.7*  AST 32  ALT 27  ALKPHOS 62  BILITOT 0.7   ------------------------------------------------------------------------------------------------------------------  Cardiac Enzymes No results for input(s): TROPONINI in the last 168 hours. ------------------------------------------------------------------------------------------------------------------  RADIOLOGY:  Dg Chest Portable 1 View  Result Date: 09/29/2018 CLINICAL DATA:  Acute left face and arm tingling. Mild headache. Chest pain and shortness of breath. EXAM: PORTABLE CHEST 1 VIEW COMPARISON:  October 26, 2008 FINDINGS: The heart size is normal. The hila and mediastinum are normal. No pneumothorax. No nodules or masses. Infiltrate seen throughout the right lung, most focal in the right base. Mild interstitial prominence on the left. No other acute abnormalities. IMPRESSION: Infiltrate seen throughout the right lung, most focal in the right base with mild interstitial prominence on the left. The findings may represent diffuse infection including atypical infections. Asymmetric edema is a possibility as well. However, there is no cardiomegaly. Electronically Signed   By: Dorise Bullion III M.D   On: 09/29/2018 21:59   Ct Head Code Stroke Wo Contrast  Addendum Date: 09/29/2018   ADDENDUM REPORT: 09/29/2018 21:17 ADDENDUM: Study discussed by telephone with Dr. Cherylann Banas on 09/29/2018 at 2113 hours. Electronically Signed   By: Genevie Ann M.D.   On: 09/29/2018 21:17   Result Date: 09/29/2018 CLINICAL DATA:  Code stroke. 63 year old female with left side numbness. Headache. EXAM: CT HEAD WITHOUT CONTRAST TECHNIQUE: Contiguous axial images were  obtained from the base of the skull through the vertex without intravenous contrast. COMPARISON:  Brain MRI 59 19, head CT 11/17/2009. FINDINGS: Brain: Cerebral volume is stable since 2019, with mild generalized cerebral volume loss since the 2011 CT. Scattered bilateral cerebral white matter hypodensity appears similar to FLAIR signal changes in 2019. No midline shift, ventriculomegaly, mass effect, evidence of mass lesion, intracranial hemorrhage or evidence of cortically based acute infarction. Gray-white matter differentiation is within normal limits throughout the brain. Vascular: Mild Calcified atherosclerosis at the skull base. No suspicious intracranial vascular hyperdensity. Skull: No acute osseous abnormality identified. Sinuses/Orbits: Visualized paranasal sinuses and mastoids are stable and well pneumatized. Other: Visualized orbits and scalp soft tissues are within normal limits. ASPECTS (  [place] Stroke Program Early CT Score) - Ganglionic level infarction (caudate, lentiform nuclei, internal capsule, insula, M1-M3 cortex): 7 - Supraganglionic infarction (M4-M6 cortex): 3 Total score (0-10 with 10 being normal): 10 IMPRESSION: 1. No acute cortically based infarct or acute intracranial hemorrhage identified. ASPECTS 10. 2. Nonspecific cerebral white matter changes appear stable to a brain MRI in 2019. Electronically Signed: By: Genevie Ann M.D. On: 09/29/2018 20:59      IMPRESSION AND PLAN:   1.  Right-sided community-acquired pneumonia with subsequent acute asthma exacerbation.The patient will be admitted to a medically monitored bed for community-acquired pneumonia and will be placed on IV Rocephin and Zithromax.  Mucolytic therapy be provided as well as duo nebs q.i.d. and q.4 hours p.r.n..   We will place her on scheduled IV steroid therapy. Sputum Gram stain culture and sensitivity will be obtained and blood cultures will be followed.   2.  TIA with left-sided numbness.  We will follow neuro  checks q.4 hours for 24 hours.  The patient will be placed on aspirin.  Will obtain a brain MRI without contrast as well as bilateral carotid Doppler and 2D echo with bubble study .  A  physical/occupation/speech therapy consults will be obtained in a.m..  The patient will be placed on statin therapy and fasting lipids will be checked.  3.  Hyponatremia.  This is likely hypovolemic due to volume depletion.  She will be hydrated with IV normal saline and will follow her BMP.  4.  Hypertension.  Permissive hypertension will be allowed.  Will resume propranolol.  5.  Dyslipidemia.  Statin therapy will be resumed and fasting lipids will be obtained.  6.  Depression with anxiety.  Lexapro and Klonopin as well as trazodone will be resumed.  7.  DVT prophylaxis.  Subcutaneous Lovenox.   All the records are reviewed and case discussed with ED provider. The plan of care was discussed in details with the patient (and family). I answered all questions. The patient agreed to proceed with the above mentioned plan. Further management will depend upon hospital course.   CODE STATUS: Full code  TOTAL TIME TAKING CARE OF THIS PATIENT: 55 minutes.    Christel Mormon M.D on 09/29/2018 at 11:59 PM  Pager - [phone]  After 6pm go to [email or web address] - Proofreader  [facility]  Office  [phone]  CC: Primary care physician; [name], MD   Note: This dictation was prepared with Dragon dictation along with smaller phrase technology. Any transcriptional errors that result from this process are unintentional.

## 2018-09-29 NOTE — Consult Note (Signed)
[facility] Tele[facility] Consult Services   Date of Service:   09/29/2018 20:55:12  Impression:     .  Rule Out Acute Ischemic Stroke     .  Acute left facial and arm paresthesias.     .  Acute chest pain and shortness of breath.  Comments/Sign-Out: NIHSS: 1 (mild left facial and right arm sensory loss compared to the opposite sides).  Mechanism of Stroke: Possible Thromboembolic Possible Cardioembolic Small Vessel Disease  Metrics: Last Known Well: 09/29/2018 19:30:16 [facility] Notification Time: 09/29/2018 22:02:54 Arrival Time: 09/29/2018 20:31:17 Stamp Time: 09/29/2018 20:55:12 Time First Login Attempt: 09/29/2018 21:00:37 Video Start Time: 09/29/2018 21:00:37  Symptoms: acute left face and arm tingling along with a mild headache, chest pain and shortness of breath. PMH: mood disorder, migraines. Not on anticoagulation. NIHSS Start Assessment Time: 09/29/2018 21:02:28 Patient is not a candidate for tPA. Patient was not deemed candidate for tPA thrombolytics because of resolving symptoms (no residual disabling symptoms).  CT head showed no acute hemorrhage or acute core infarct.  Clinical Presentation is not Suggestive of Large Vessel Occlusive Disease  ED Physician notified of diagnostic impression and management plan on 09/29/2018 21:11:01  Our recommendations are outlined below.  Recommendations:     .  Activate Stroke Protocol Admission/Order Set     .  Stroke/Telemetry Floor     .  Neuro Checks     .  Bedside Swallow Eval     .  DVT Prophylaxis     .  IV Fluids, Normal Saline     .  Head of Bed 30 Degrees     .  Euglycemia and Avoid Hyperthermia (PRN Acetaminophen)     .  Antiplatelet Therapy Recommended    Sign Out:     .  Discussed with Emergency Department Provider    ------------------------------------------------------------------------------  History of Present Illness: Patient is a 63 year old Female.  Patient  was brought by private transportation with symptoms of acute left face and arm tingling along with a mild headache, chest pain and shortness of breath. PMH: mood disorder, migraines. Not on anticoagulation.  63 year old female with acute left face and arm tingling along with a mild headache, chest pain and shortness of breath. PMH: mood disorder, migraines. Not on anticoagulation.  Last seen normal was within 4.5 hours. There is no history of hemorrhagic complications or intracranial hemorrhage. There is no history of Recent Anticoagulants. There is no history of recent major surgery. There is no history of recent stroke.  Examination: 1A: Level of Consciousness - Alert; keenly responsive + 0 1B: Ask Month and Age - Both Questions Right + 0 1C: Blink Eyes & Squeeze Hands - Performs Both Tasks + 0 2: Test Horizontal Extraocular Movements - Normal + 0 3: Test Visual Fields - No Visual Loss + 0 4: Test Facial Palsy (Use Grimace if Obtunded) - Normal symmetry + 0 5A: Test Left Arm Motor Drift - No Drift for 10 Seconds + 0 5B: Test Right Arm Motor Drift - No Drift for 10 Seconds + 0 6A: Test Left Leg Motor Drift - No Drift for 5 Seconds + 0 6B: Test Right Leg Motor Drift - No Drift for 5 Seconds + 0 7: Test Limb Ataxia (FNF/Heel-Shin) - No Ataxia + 0 8: Test Sensation - Mild-Moderate Loss: Less Sharp/More Dull + 1 9: Test Language/Aphasia - Normal; No aphasia + 0 10: Test Dysarthria - Normal + 0 11: Test Extinction/Inattention - No abnormality + 0  NIHSS Score: 1  Patient/Family was informed the Neurology Consult would happen via TeleHealth consult by way of interactive audio and video telecommunications and consented to receiving care in this manner.  Due to the immediate potential for life-threatening deterioration due to underlying acute neurologic illness, I spent 35 minutes providing critical care. This time includes time for face to face visit via telemedicine, review of medical  records, imaging studies and discussion of findings with providers, the patient and/or family.   Dr Ellin Goodie   [facility] [phone]  Case [id number]

## 2018-09-29 NOTE — ED Notes (Signed)
Call bell answered, pt reports "can't breathe" O2 sats 99% on 2 lpm [place]

## 2018-09-30 ENCOUNTER — Other Ambulatory Visit: Payer: Self-pay

## 2018-09-30 ENCOUNTER — Inpatient Hospital Stay: Payer: Medicare Other

## 2018-09-30 ENCOUNTER — Inpatient Hospital Stay (HOSPITAL_COMMUNITY)
Admit: 2018-09-30 | Discharge: 2018-09-30 | Disposition: A | Discharge status: Home / Self Care | Payer: Medicare Other | Attending: Family Medicine | Admitting: Family Medicine

## 2018-09-30 DIAGNOSIS — I340 Nonrheumatic mitral (valve) insufficiency: Secondary | ICD-10-CM

## 2018-09-30 DIAGNOSIS — G459 Transient cerebral ischemic attack, unspecified: Secondary | ICD-10-CM

## 2018-09-30 LAB — BLOOD GAS, ARTERIAL
Acid-base deficit: 0.4 mmol/L (ref 0.0–2.0)
Bicarbonate: 24.8 mmol/L (ref 20.0–28.0)
FIO2: 100
O2 Saturation: 97.3 %
Patient temperature: 37
pCO2 arterial: 42 mmHg (ref 32.0–48.0)
pH, Arterial: 7.38 (ref 7.350–7.450)
pO2, Arterial: 96 mmHg (ref 83.0–108.0)

## 2018-09-30 LAB — URINE DRUG SCREEN, QUALITATIVE (ARMC ONLY)
Amphetamines, Ur Screen: NOT DETECTED
Barbiturates, Ur Screen: NOT DETECTED
Benzodiazepine, Ur Scrn: NOT DETECTED
Cannabinoid 50 Ng, Ur ~~LOC~~: NOT DETECTED
Cocaine Metabolite,Ur ~~LOC~~: NOT DETECTED
MDMA (Ecstasy)Ur Screen: NOT DETECTED
Methadone Scn, Ur: NOT DETECTED
Opiate, Ur Screen: NOT DETECTED
Phencyclidine (PCP) Ur S: NOT DETECTED
Tricyclic, Ur Screen: NOT DETECTED

## 2018-09-30 LAB — EXPECTORATED SPUTUM ASSESSMENT W GRAM STAIN, RFLX TO RESP C

## 2018-09-30 LAB — URINALYSIS, ROUTINE W REFLEX MICROSCOPIC
Bilirubin Urine: NEGATIVE
Glucose, UA: >=500 mg/dL — AB
Hgb urine dipstick: NEGATIVE
Ketones, ur: NEGATIVE mg/dL
Leukocytes,Ua: NEGATIVE
Nitrite: NEGATIVE
Protein, ur: NEGATIVE mg/dL
Specific Gravity, Urine: 1.008 (ref 1.005–1.030)
Squamous Epithelial / HPF: NONE SEEN (ref 0–5)
pH: 7 (ref 5.0–8.0)

## 2018-09-30 LAB — CBC
HCT: 40.6 % (ref 36.0–46.0)
Hemoglobin: 14 g/dL (ref 12.0–15.0)
MCH: 31.4 pg (ref 26.0–34.0)
MCHC: 34.5 g/dL (ref 30.0–36.0)
MCV: 91 fL (ref 80.0–100.0)
Platelets: 273 10*3/uL (ref 150–400)
RBC: 4.46 MIL/uL (ref 3.87–5.11)
RDW: 11 % — ABNORMAL LOW (ref 11.5–15.5)
WBC: 11.2 10*3/uL — ABNORMAL HIGH (ref 4.0–10.5)
nRBC: 0 % (ref 0.0–0.2)

## 2018-09-30 LAB — COMPREHENSIVE METABOLIC PANEL
ALT: 25 U/L (ref 0–44)
AST: 38 U/L (ref 15–41)
Albumin: 3.6 g/dL (ref 3.5–5.0)
Alkaline Phosphatase: 63 U/L (ref 38–126)
Anion gap: 14 (ref 5–15)
BUN: 15 mg/dL (ref 8–23)
CO2: 22 mmol/L (ref 22–32)
Calcium: 8.2 mg/dL — ABNORMAL LOW (ref 8.9–10.3)
Chloride: 88 mmol/L — ABNORMAL LOW (ref 98–111)
Creatinine, Ser: 0.62 mg/dL (ref 0.44–1.00)
GFR calc Af Amer: >60 mL/min (ref >60)
GFR calc non Af Amer: >60 mL/min (ref >60)
Glucose, Bld: 208 mg/dL — ABNORMAL HIGH (ref 70–99)
Potassium: 4.1 mmol/L (ref 3.5–5.1)
Sodium: 124 mmol/L — ABNORMAL LOW (ref 135–145)
Total Bilirubin: 1.1 mg/dL (ref 0.3–1.2)
Total Protein: 7 g/dL (ref 6.5–8.1)

## 2018-09-30 LAB — LIPID PANEL
Cholesterol: 159 mg/dL (ref 0–200)
HDL: 65 mg/dL (ref >40)
LDL Cholesterol: 87 mg/dL (ref 0–99)
Total CHOL/HDL Ratio: 2.4 RATIO
Triglycerides: 34 mg/dL (ref <150)
VLDL: 7 mg/dL (ref 0–40)

## 2018-09-30 LAB — STREP PNEUMONIAE URINARY ANTIGEN: Strep Pneumo Urinary Antigen: NEGATIVE

## 2018-09-30 LAB — HEMOGLOBIN A1C
Hgb A1c MFr Bld: 5.3 % (ref 4.8–5.6)
Mean Plasma Glucose: 105.41 mg/dL

## 2018-09-30 MED ORDER — ASPIRIN EC 81 MG PO TBEC
81.0000 mg | DELAYED_RELEASE_TABLET | Freq: Every day | ORAL | Status: DC
  Administered 2018-09-30 – 2018-10-01 (×2): 81 mg via ORAL
  Filled 2018-09-30 (×2): qty 1

## 2018-09-30 MED ORDER — SIMVASTATIN 10 MG PO TABS: 20.0000 mg | ORAL_TABLET | Freq: Every day | ORAL | Status: DC

## 2018-09-30 MED ORDER — GABAPENTIN 100 MG PO CAPS
100.0000 mg | ORAL_CAPSULE | Freq: Every morning | ORAL | Status: DC
  Administered 2018-09-30 – 2018-10-01 (×2): 100 mg via ORAL
  Filled 2018-09-30 (×2): qty 1

## 2018-09-30 MED ORDER — METHYLPREDNISOLONE SODIUM SUCC 125 MG IJ SOLR
60.0000 mg | Freq: Three times a day (TID) | INTRAMUSCULAR | Status: DC
  Administered 2018-09-30 – 2018-10-01 (×4): 60 mg via INTRAVENOUS
  Filled 2018-09-30 (×5): qty 2

## 2018-09-30 MED ORDER — BUDESONIDE 0.25 MG/2ML IN SUSP
0.2500 mg | Freq: Two times a day (BID) | RESPIRATORY_TRACT | Status: DC
  Administered 2018-09-30 – 2018-10-01 (×3): 0.25 mg via RESPIRATORY_TRACT
  Filled 2018-09-30 (×4): qty 2

## 2018-09-30 MED ORDER — GABAPENTIN 100 MG PO CAPS
200.0000 mg | ORAL_CAPSULE | Freq: Every day | ORAL | Status: DC
  Administered 2018-09-30: 200 mg via ORAL
  Filled 2018-09-30: qty 2

## 2018-09-30 MED ORDER — ACETAMINOPHEN 325 MG PO TABS
ORAL_TABLET | ORAL | Status: AC
  Administered 2018-09-30: 03:00:00 650 mg via ORAL
  Filled 2018-09-30: qty 2

## 2018-09-30 NOTE — ED Notes (Signed)
Swallow screen passed pt to be admitted

## 2018-09-30 NOTE — Consult Note (Addendum)
Referring Physician: Pyreddy    Chief Complaint: Left sided numbness  HPI: Theresa Martin is an 63 y.o. female with a history of migraines and trigeminal neuralgia who on yesterday began to have a right sided headache. Had also noted that day left ankle and left shoulder pain (had previously had right shoulder pain).  Then later in the evening developed acutely left sided numbness.  Patient presented to the ED at that time.  Symptoms resolved completely overnight.  Initial NIHSS of 1.  Date last known well: Date: 09/29/2018 Time last known well: Time: 19:00 tPA Given: No: Minimal symptoms  Past Medical History:  Diagnosis Date  . Allergy   . Anxiety   . Depression   . Hyperlipidemia   . Hypertension   . Osteopenia after menopause   . Persistent headaches   . Pneumonia   . Postherpetic neuralgia   . Renal disorder   . Shingles outbreak 02/2014  . Skin cancer    moles on right groin and right buttock.    Past Surgical History:  Procedure Laterality Date  . CARDIOVASCULAR STRESS TEST  02/2009   treadmill stress test: Low risk  . [place]  . COLONOSCOPY     2014/2015 Anza, normal  . EXPLORATORY LAPAROTOMY     laproscopy for infertility  . EYE SURGERY Bilateral    laser-correct opening b/tn cornea and iris  . MOLE REMOVAL  2017   x 2 moles    Family History  Problem Relation Age of Onset  . Hypertension Mother   . Coronary artery disease Mother   . Heart failure Mother   . Hypertension Father   . Liver disease Sister        liver failure  . Arrhythmia Brother   . Hypertension Brother   . Cancer Neg Hx        colon   . Breast cancer Neg Hx    Social History:  reports that she has never smoked. She has never used smokeless tobacco. She reports that she does not drink alcohol or use drugs.  Allergies: No Known Allergies  Medications:  I have reviewed the patient's current medications. Prior to Admission:  Medications Prior to  Admission  Medication Sig Dispense Refill Last Dose  . albuterol (VENTOLIN HFA) 108 (90 Base) MCG/ACT inhaler Inhale 2 puffs into the lungs every 6 (six) hours as needed for wheezing or shortness of breath. 1 Inhaler 2 prn at prn  . atorvastatin (LIPITOR) 20 MG tablet TAKE 1 TABLET BY MOUTH EVERY DAY 30 tablet 6 09/29/2018 at unknown  . beclomethasone (QVAR) 80 MCG/ACT inhaler Inhale 1-2 puffs into the lungs 2 (two) times daily. 10.6 g 0 09/29/2018 at unknown  . Calcium-Vitamin D-Vitamin K (CALCIUM + D + K PO) Take 3 each by mouth daily. Calcium 53m- Vitamin D 1000iu- K 428m   09/29/2018 at unknown  . clonazePAM (KLONOPIN) 0.5 MG tablet Take 0.25 mg by mouth at bedtime as needed.   2 prn at prn  . ELDERBERRY PO Take by mouth.   09/29/2018 at unknown  . escitalopram (LEXAPRO) 10 MG tablet Take 10 mg by mouth daily.    09/29/2018 at unknown  . Fluocinolone-Emollient (FLUOCINOLONE CREAM & EMOLLIENT EX) Apply topically.   09/29/2018 at unknown  . gabapentin (NEURONTIN) 100 MG capsule Take 1-3 capsules (100-300 mg total) by mouth 2 (two) times daily. 180 capsule 12 09/29/2018 at unknown  . Glucosamine-Chondroitin (MOVE FREE PO) Take 1 tablet by  mouth daily.   09/29/2018 at unknown  . ibuprofen (ADVIL,MOTRIN) 200 MG tablet Take 200 mg by mouth every 6 (six) hours as needed.   prn at prn  . Ketoconazole-Hydrocortisone 2 & 1 % KIT Apply topically as needed. Cracks on mouth   prn at prn  . lamoTRIgine (LAMICTAL) 100 MG tablet TAKE 128m in the morning and 100 mg by mouth before bed.   09/29/2018 at 1000  . Melatonin 3 MG CAPS Take 1 capsule by mouth at bedtime.   09/28/2018 at unknown  . montelukast (SINGULAIR) 10 MG tablet Take 1 tablet (10 mg total) by mouth at bedtime. 30 tablet 3 09/28/2018 at unknown  . Naproxen Sodium (ALEVE) 220 MG CAPS As needed for headache 60 each  prn at prn  . NEOMYCIN-POLYMYXIN-HYDROCORTISONE (CORTISPORIN) 1 % SOLN OTIC solution Apply 1-2 drops to toe bid after soaking 10 mL 1 09/29/2018 at unknown   . ondansetron (ZOFRAN) 4 MG tablet Take 1 tablet (4 mg total) by mouth every 8 (eight) hours as needed for nausea or vomiting. 20 tablet 0 prn at prn  . propranolol (INDERAL) 40 MG tablet Take 1 tablet (40 mg total) by mouth 2 (two) times daily. 180 tablet 4 09/29/2018 at unknown  . QUEtiapine (SEROQUEL XR) 50 MG TB24 24 hr tablet Take 50 mg by mouth at bedtime.    09/28/2018 at unknown  . traZODone (DESYREL) 50 MG tablet Take 50 mg by mouth at bedtime as needed for sleep.    prn at prn  . triamterene-hydrochlorothiazide (MAXZIDE-25) 37.5-25 MG tablet TAKE 1 TABLET BY MOUTH EVERY DAY 30 tablet 6 09/29/2018 at unknown   Scheduled: . aspirin EC  81 mg Oral Daily  . atorvastatin  20 mg Oral Daily  . budesonide (PULMICORT) nebulizer solution  0.25 mg Nebulization BID  . Calcium-Vitamin D-Vitamin K   Oral Daily  . enoxaparin (LOVENOX) injection  40 mg Subcutaneous Q24H  . escitalopram  10 mg Oral Daily  . gabapentin  100 mg Oral q morning - 10a  . gabapentin  200 mg Oral QHS  . guaiFENesin  600 mg Oral BID  . ipratropium-albuterol  3 mL Nebulization QID  . lamoTRIgine  100 mg Oral BID  . Melatonin  5 mg Oral QHS  . methylPREDNISolone (SOLU-MEDROL) injection  60 mg Intravenous Q8H  . montelukast  10 mg Oral QHS  . propranolol  40 mg Oral BID  . QUEtiapine  50 mg Oral QHS  . senna-docusate  1 tablet Oral BID    ROS: History obtained from the patient  General ROS: negative for - chills, fatigue, fever, night sweats, weight gain or weight loss Psychological ROS: negative for - behavioral disorder, hallucinations, memory difficulties, mood swings or suicidal ideation Ophthalmic ROS: negative for - blurry vision, double vision, eye pain or loss of vision ENT ROS: negative for - epistaxis, nasal discharge, oral lesions, sore throat, tinnitus or vertigo Allergy and Immunology ROS: negative for - hives or itchy/watery eyes Hematological and Lymphatic ROS: negative for - bleeding problems, bruising or  swollen lymph nodes Endocrine ROS: negative for - galactorrhea, hair pattern changes, polydipsia/polyuria or temperature intolerance Respiratory ROS: cough, shortness of breath  Cardiovascular ROS: negative for - chest pain, dyspnea on exertion, edema or irregular heartbeat Gastrointestinal ROS: negative for - abdominal pain, diarrhea, hematemesis, nausea/vomiting or stool incontinence Genito-Urinary ROS: negative for - dysuria, hematuria, incontinence or urinary frequency/urgency Musculoskeletal ROS: as noted in HPI Neurological ROS: as noted in HPI Dermatological ROS: negative  for rash and skin lesion changes  Physical Examination: Blood pressure 125/67, pulse 66, temperature 98.5 F (36.9 C), temperature source Oral, resp. rate 18, height _0  (1.549 m), weight 52.8 kg, SpO2 99 %.  HEENT-  Normocephalic, no lesions, without obvious abnormality.  Normal external eye and conjunctiva.  Normal TM's bilaterally.  Normal auditory canals and external ears. Normal external nose, mucus membranes and septum.  Normal pharynx. Cardiovascular- S1, S2 normal, pulses palpable throughout   Lungs- chest clear, no wheezing, rales, normal symmetric air entry Abdomen- soft, non-tender; bowel sounds normal; no masses,  no organomegaly Extremities- no edema Lymph-no adenopathy palpable Musculoskeletal-no joint tenderness, deformity or swelling Skin-warm and dry, no hyperpigmentation, vitiligo, or suspicious lesions  Neurological Examination   Mental Status: Alert, oriented, thought content appropriate.  Speech fluent without evidence of aphasia.  Able to follow 3 step commands without difficulty. Cranial Nerves: II: Discs flat bilaterally; Visual fields grossly normal, pupils equal, round, reactive to light and accommodation III,IV, VI: ptosis not present, extra-ocular motions intact bilaterally V,VII: smile symmetric, facial light touch sensation normal bilaterally VIII: hearing normal  bilaterally IX,X: gag reflex present XI: bilateral shoulder shrug XII: midline tongue extension Motor: Right : Upper extremity   5/5    Left:     Upper extremity   5/5  Lower extremity   5/5     Lower extremity   5/5 Tone and bulk:normal tone throughout; no atrophy noted Sensory: Pinprick and light touch intact throughout, bilaterally Deep Tendon Reflexes: Symmetric throughout Plantars: Right: downgoing   Left: downgoing Cerebellar: Normal finger-to-nose and normal heel-to-shin testing bilaterally Gait: not tested due to O2 requirements    Laboratory Studies:  Basic Metabolic Panel: Recent Labs  Lab 09/29/18 2043 09/30/18 0549  NA 127* 124*  K 3.7 4.1  CL 89* 88*  CO2 29 22  GLUCOSE 105* 208*  BUN 18 15  CREATININE 0.68 0.62  CALCIUM 8.7* 8.2*    Liver Function Tests: Recent Labs  Lab 09/29/18 2043 09/30/18 0549  AST 32 38  ALT 27 25  ALKPHOS 62 63  BILITOT 0.7 1.1  PROT 7.6 7.0  ALBUMIN 4.4 3.6   No results for input(s): LIPASE, AMYLASE in the last 168 hours. No results for input(s): AMMONIA in the last 168 hours.  CBC: Recent Labs  Lab 09/29/18 2043 09/30/18 0549  WBC 6.0 11.2*  NEUTROABS 3.6  --   HGB 12.8 14.0  HCT 37.7 40.6  MCV 91.1 91.0  PLT 260 273    Cardiac Enzymes: No results for input(s): CKTOTAL, CKMB, CKMBINDEX, TROPONINI in the last 168 hours.  BNP: Invalid input(s): POCBNP  CBG: No results for input(s): GLUCAP in the last 168 hours.  Microbiology: Results for orders placed or performed during the hospital encounter of 09/29/18  SARS Coronavirus 2 (CEPHEID- Performed in [place] hospital lab), Hosp Order     Status: None   Collection Time: 09/29/18  9:55 PM   Specimen: Nasopharyngeal Swab  Result Value Ref Range Status   SARS Coronavirus 2 NEGATIVE NEGATIVE Final    Comment: (NOTE) If result is NEGATIVE SARS-CoV-2 target nucleic acids are NOT DETECTED. The SARS-CoV-2 RNA is generally detectable in upper and lower   respiratory specimens during the acute phase of infection. The lowest  concentration of SARS-CoV-2 viral copies this assay can detect is 250  copies / mL. A negative result does not preclude SARS-CoV-2 infection  and should not be used as the sole basis for treatment or other  patient management decisions.  A negative result may occur with  improper specimen collection / handling, submission of specimen other  than nasopharyngeal swab, presence of viral mutation(s) within the  areas targeted by this assay, and inadequate number of viral copies  (<250 copies / mL). A negative result must be combined with clinical  observations, patient history, and epidemiological information. If result is POSITIVE SARS-CoV-2 target nucleic acids are DETECTED. The SARS-CoV-2 RNA is generally detectable in upper and lower  respiratory specimens dur ing the acute phase of infection.  Positive  results are indicative of active infection with SARS-CoV-2.  Clinical  correlation with patient history and other diagnostic information is  necessary to determine patient infection status.  Positive results do  not rule out bacterial infection or co-infection with other viruses. If result is PRESUMPTIVE POSTIVE SARS-CoV-2 nucleic acids MAY BE PRESENT.   A presumptive positive result was obtained on the submitted specimen  and confirmed on repeat testing.  While 2019 novel coronavirus  (SARS-CoV-2) nucleic acids may be present in the submitted sample  additional confirmatory testing may be necessary for epidemiological  and / or clinical management purposes  to differentiate between  SARS-CoV-2 and other Sarbecovirus currently known to infect humans.  If clinically indicated additional testing with an alternate test  methodology [phone]) is advised. The SARS-CoV-2 RNA is generally  detectable in upper and lower respiratory sp ecimens during the acute  phase of infection. The expected result is Negative. Fact  Sheet for Patients:  StrictlyIdeas.no Fact Sheet for Healthcare Providers: BankingDealers.co.za This test is not yet approved or cleared by the [place] FDA and has been authorized for detection and/or diagnosis of SARS-CoV-2 by FDA under an Emergency Use Authorization (EUA).  This EUA will remain in effect (meaning this test can be used) for the duration of the COVID-19 declaration under Section 564(b)(1) of the Act, 21 U.S.C. section 360bbb-3(b)(1), unless the authorization is terminated or revoked sooner. Performed at [facility], [address] [place], [place] [address]   Blood culture (routine x 2)     Status: None (Preliminary result)   Collection Time: 09/29/18 10:28 PM   Specimen: BLOOD  Result Value Ref Range Status   Specimen Description BLOOD LEFT ANTECUBITAL  Final   Special Requests   Final    BOTTLES DRAWN AEROBIC AND ANAEROBIC Blood Culture adequate volume   Culture   Final    NO GROWTH < 12 HOURS Performed at [facility], [address], [place], [place] [address]    Report Status PENDING  Incomplete  Blood culture (routine x 2)     Status: None (Preliminary result)   Collection Time: 09/29/18 10:28 PM   Specimen: BLOOD  Result Value Ref Range Status   Specimen Description BLOOD RIGHT ANTECUBITAL  Final   Special Requests   Final    BOTTLES DRAWN AEROBIC AND ANAEROBIC Blood Culture adequate volume   Culture   Final    NO GROWTH < 12 HOURS Performed at [facility], [address], [place], [place] [address]    Report Status PENDING  Incomplete  Culture, sputum-assessment     Status: None   Collection Time: 09/30/18  5:49 AM   Specimen: Expectorated Sputum  Result Value Ref Range Status   Specimen Description EXPECTORATED SPUTUM  Final   Special Requests NONE  Final   Sputum evaluation   Final    Sputum specimen not acceptable for testing.  Please recollect.   C/NOEL  WEBSTER AT 8588 09/30/2018.PMF Performed at [facility], [address] [place], [place] [address]    Report Status 09/30/2018 FINAL  Final    Coagulation Studies: Recent Labs    09/29/18 2043  LABPROT 12.2  INR 0.9    Urinalysis:  Recent Labs  Lab 09/30/18 0755  COLORURINE STRAW*  LABSPEC 1.008  PHURINE 7.0  GLUCOSEU >=500*  HGBUR NEGATIVE  BILIRUBINUR NEGATIVE  KETONESUR NEGATIVE  PROTEINUR NEGATIVE  NITRITE NEGATIVE  LEUKOCYTESUR NEGATIVE    Lipid Panel:    Component Value Date/Time   CHOL 159 09/30/2018 0549   TRIG 34 09/30/2018 0549   HDL 65 09/30/2018 0549   CHOLHDL 2.4 09/30/2018 0549   VLDL 7 09/30/2018 0549   LDLCALC 87 09/30/2018 0549   LDLCALC 106 (H) 06/08/2017 1534    HgbA1C:  Lab Results  Component Value Date   HGBA1C 5.3 09/30/2018    Urine Drug Screen:      Component Value Date/Time   LABOPIA NONE DETECTED 09/30/2018 0755   COCAINSCRNUR NONE DETECTED 09/30/2018 0755   LABBENZ NONE DETECTED 09/30/2018 0755   AMPHETMU NONE DETECTED 09/30/2018 0755   THCU NONE DETECTED 09/30/2018 0755   LABBARB NONE DETECTED 09/30/2018 0755    Alcohol Level:  Recent Labs  Lab 09/29/18 2043  ETH <10    Other results: EKG: sinus rhythm at 60 bpm.  Imaging: US Carotid Bilateral (at [facility] Only)  Result Date: 09/30/2018 CLINICAL DATA:  TIA symptoms, hypertension, hyperlipidemia EXAM: BILATERAL CAROTID DUPLEX ULTRASOUND TECHNIQUE: Pearline Cables scale imaging, color Doppler and duplex ultrasound were performed of bilateral carotid and vertebral arteries in the neck. COMPARISON:  None. FINDINGS: Criteria: Quantification of carotid stenosis is based on velocity parameters that correlate the residual internal carotid diameter with NASCET-based stenosis levels, using the diameter of the distal internal carotid lumen as the denominator for stenosis measurement. The following velocity measurements were obtained: RIGHT ICA: 117/34 cm/sec CCA: 41/28 cm/sec  SYSTOLIC ICA/CCA RATIO:  1.8 ECA: 78 cm/sec LEFT ICA: 109/30 cm/sec CCA: 78/67 cm/sec SYSTOLIC ICA/CCA RATIO:  1.6 ECA: 85 cm/sec RIGHT CAROTID ARTERY: Minor echogenic shadowing plaque formation. No hemodynamically significant right ICA stenosis, velocity elevation, or turbulent flow. Degree of narrowing less than 50%. RIGHT VERTEBRAL ARTERY:  Antegrade LEFT CAROTID ARTERY: Similar scattered minor echogenic plaque formation. No hemodynamically significant left ICA stenosis, velocity elevation, or turbulent flow. LEFT VERTEBRAL ARTERY:  Antegrade IMPRESSION: Minor carotid atherosclerosis. No hemodynamically significant ICA stenosis. Degree of narrowing less than 50% bilaterally by ultrasound criteria. Patent antegrade vertebral flow bilaterally Electronically Signed   By: Jerilynn Mages.  Shick M.D.   On: 09/30/2018 09:50   Dg Chest Portable 1 View  Result Date: 09/29/2018 CLINICAL DATA:  Acute left face and arm tingling. Mild headache. Chest pain and shortness of breath. EXAM: PORTABLE CHEST 1 VIEW COMPARISON:  October 26, 2008 FINDINGS: The heart size is normal. The hila and mediastinum are normal. No pneumothorax. No nodules or masses. Infiltrate seen throughout the right lung, most focal in the right base. Mild interstitial prominence on the left. No other acute abnormalities. IMPRESSION: Infiltrate seen throughout the right lung, most focal in the right base with mild interstitial prominence on the left. The findings may represent diffuse infection including atypical infections. Asymmetric edema is a possibility as well. However, there is no cardiomegaly. Electronically Signed   By: Dorise Bullion III M.D   On: 09/29/2018 21:59   Ct Head Code Stroke Wo Contrast  Addendum Date: 09/29/2018  ADDENDUM REPORT: 09/29/2018 21:17 ADDENDUM: Study discussed by telephone with Dr. Cherylann Banas on 09/29/2018 at 2113 hours. Electronically Signed   By: Genevie Ann M.D.   On: 09/29/2018 21:17   Result Date: 09/29/2018 CLINICAL DATA:  Code  stroke. 63 year old female with left side numbness. Headache. EXAM: CT HEAD WITHOUT CONTRAST TECHNIQUE: Contiguous axial images were obtained from the base of the skull through the vertex without intravenous contrast. COMPARISON:  Brain MRI 59 19, head CT 11/17/2009. FINDINGS: Brain: Cerebral volume is stable since 2019, with mild generalized cerebral volume loss since the 2011 CT. Scattered bilateral cerebral white matter hypodensity appears similar to FLAIR signal changes in 2019. No midline shift, ventriculomegaly, mass effect, evidence of mass lesion, intracranial hemorrhage or evidence of cortically based acute infarction. Gray-white matter differentiation is within normal limits throughout the brain. Vascular: Mild Calcified atherosclerosis at the skull base. No suspicious intracranial vascular hyperdensity. Skull: No acute osseous abnormality identified. Sinuses/Orbits: Visualized paranasal sinuses and mastoids are stable and well pneumatized. Other: Visualized orbits and scalp soft tissues are within normal limits. ASPECTS [facility] Stroke Program Early CT Score) - Ganglionic level infarction (caudate, lentiform nuclei, internal capsule, insula, M1-M3 cortex): 7 - Supraganglionic infarction (M4-M6 cortex): 3 Total score (0-10 with 10 being normal): 10 IMPRESSION: 1. No acute cortically based infarct or acute intracranial hemorrhage identified. ASPECTS 10. 2. Nonspecific cerebral white matter changes appear stable to a brain MRI in 2019. Electronically Signed: By: Genevie Ann M.D. On: 09/29/2018 20:59    Assessment: 63 y.o. female with a history of migraine presenting with left sided numbness preceded by right sided headache.  MRI reviewed and shows no acute changes.  Some T2 white matter lesions noted that were seen on last imaging.  Patient on no antiplatelet therapy prior to admission.  Carotid dopplers show no evidence of hemodynamically significant stenosis.  Echocardiogram pending.  A1c 5.3, LDL 87.  TIA  on the differential but can not ule out the possibility of a complicated migraine as well.  Further work up recommended.    Stroke Risk Factors - hyperlipidemia and hypertension  Plan: 1. PT consult, OT consult, Speech consult 2. Echocardiogram pending 3. Prophylactic therapy-Dual antiplatelet therapy with ASA 71m and Plavix 735mfor three weeks with change to ASA 8144mlone as monotherapy after that time. 4. NPO until RN stroke swallow screen 5. Telemetry monitoring 6. Frequent neuro checks 7. Statin for lipid management with target LDL<70. 8. Follow up with neurology after discharge   LesAlexis GoodellD Neurology [id number]/2020, 2:15 PM

## 2018-09-30 NOTE — Progress Notes (Signed)
Advanced care plan. Purpose of the Encounter: CODE STATUS Parties in Attendance: Patient Patient's Decision Capacity: Good Subjective/Patient's story: Theresa Martin  is a 63 y.o. Caucasian female with a known history of multiple medical problems that are mentioned below, who presented to the emergency room with a concern of left upper and lower extremity numbness without weakness that started earlier today and has been getting better without headache or dizziness or blurred vision dysarthria or dysphagia.  She denies any chest pain or palpitations.  She has been having worsening cough that has been mainly dry over the last couple of days with associated dyspnea and wheezing.  She denied any fever or chills. Upon presentation to the emergency room temperature was 97.2 and blood pressure was elevated 196/92 otherwise normal vital signs.  Labs revealed hyponatremia 127 and hypokalemia of 89.  COVID-19 test came back negative.  ABG showed pH 7.38, PCO2 42, PO2 of 96 bicarbonate 24.8 and O2 sat of 97% on 100% nonrebreather.  Portable chest x-ray revealed suspected right-sided pneumonia with mild interstitial prominence on the left.  It showed cardiomegaly.  EKG showed normal sinus rhythm with rate of 60 with T wave inversion laterally and poor R wave progression.  Head CT scan revealed no acute intracranial abnormalities. The patient was given 4 baby aspirin, IV Rocephin and Zithromax as well as 3 duo nebs.  She will be admitted to a medically monitored bed for further evaluation and management. Objective/Medical story Patient needs treatment for pneumonia with antibiotics and needs coronavirus testing.  Needs evaluation for CVA with MRI brain, carotid ultrasound and echocardiogram. Goals of care determination:  Advance care directives goals of care treatment plan discussed Patient wants everything done which includes CPR, intubation ventilator if the need arises. CODE STATUS: Full code Time spent  discussing advanced care planning: 16 minutes

## 2018-09-30 NOTE — Evaluation (Signed)
Physical Therapy Evaluation Patient Details Name: Theresa Martin MRN: [id number] DOB: 09-12-1955 Today's Date: 09/30/2018   History of Present Illness  63 y.o. female who presented to the emergency room with a concern of left upper and lower extremity numbness without weakness  Clinical Impression  Pt generally confident and safe with mobility and limited in-room ambulation.  She was on 5L with sats at 99%, ambulation in room (and use of bathroom) on room air with some fatigue and sats dropping to mid/low 90s w/o excessive SOB or dyspnea.  Pt confidence with balance, showed no asymmetry of focal weakness and generally reports feeling close to her baseline apart from her breathing issues.  PT did not see need for further f/u once medically ready for d/c and pt agrees with this assessment, will complete PT orders at this time.     Follow Up Recommendations No PT follow up    Equipment Recommendations  None recommended by PT    Recommendations for Other Services       Precautions / Restrictions Precautions Precautions: None Restrictions Weight Bearing Restrictions: No      Mobility  Bed Mobility Overal bed mobility: Independent             General bed mobility comments: Pt easily transfers to EOB w/o assist  Transfers Overall transfer level: Independent Equipment used: None             General transfer comment: Pt able to rise and maintain balance without LOBs and showing good confidence  Ambulation/Gait Ambulation/Gait assistance: Modified independent (Device/Increase time) Gait Distance (Feet): 40 Feet Assistive device: None       General Gait Details: Pt able to ambulate with good confidence and no LOBs, did fatigue with the brief in room ambulation w/o O2 (sats dropped from 99% to 93% quickly on room air)  Stairs            Wheelchair Mobility    Modified Rankin (Stroke Patients Only)       Balance Overall balance assessment: Independent                                            Pertinent Vitals/Pain Pain Assessment: No/denies pain(c/o vague heaviness in lungs)    Home Living Family/patient expects to be discharged to:: Private residence Living Arrangements: Alone     Home Access: (can go around back with no steps)       Home Equipment: None      Prior Function Level of Independence: Independent         Comments: Pt able to drive, run errands, maintain home, etc     Hand Dominance        Extremity/Trunk Assessment   Upper Extremity Assessment Upper Extremity Assessment: Overall WFL for tasks assessed    Lower Extremity Assessment Lower Extremity Assessment: Overall WFL for tasks assessed       Communication   Communication: No difficulties  Cognition Arousal/Alertness: Awake/alert Behavior During Therapy: Flat affect Overall Cognitive Status: Within Functional Limits for tasks assessed                                        General Comments      Exercises     Assessment/Plan    PT Assessment Patent  does not need any further PT services  PT Problem List         PT Treatment Interventions      PT Goals (Current goals can be found in the Care Plan section)  Acute Rehab PT Goals Patient Stated Goal: get breathing better PT Goal Formulation: All assessment and education complete, DC therapy    Frequency     Barriers to discharge        Co-evaluation               AM-PAC PT "6 Clicks" Mobility  Outcome Measure Help needed turning from your back to your side while in a flat bed without using bedrails?: None Help needed moving from lying on your back to sitting on the side of a flat bed without using bedrails?: None Help needed moving to and from a bed to a chair (including a wheelchair)?: None Help needed standing up from a chair using your arms (e.g., wheelchair or bedside chair)?: None Help needed to walk in hospital room?:  None Help needed climbing 3-5 steps with a railing? : None 6 Click Score: 24    End of Session Equipment Utilized During Treatment: Oxygen(5L, off during ambulation) Activity Tolerance: Patient limited by fatigue;Patient tolerated treatment well Patient left: with bed alarm set;with call bell/phone within reach Nurse Communication: Mobility status PT Visit Diagnosis: Muscle weakness (generalized) (M62.81);Other symptoms and signs involving the nervous system (R29.898)    Time: 0488-8916 PT Time Calculation (min) (ACUTE ONLY): 14 min   Charges:   PT Evaluation $PT Eval Low Complexity: 1 Low          Kreg Shropshire, DPT 09/30/2018, 2:09 PM

## 2018-09-30 NOTE — ED Notes (Signed)
ED TO INPATIENT HANDOFF REPORT  ED Nurse Name and Phone #: Ena Dawley [phone]  S Name/Age/Gender Theresa Martin 63 y.o. female Room/Bed: ED12A/ED12A  Code Status   Code Status: Full Code  Home/SNF/Other Home Patient oriented to: self, place, time and situation Is this baseline? Yes   Triage Complete: Triage complete  Chief Complaint Left Side Numbness  Triage Note Patient ambulatory to stat desk stating that she has felt pressure in her head all day. Patient states that about an hour ago she started developing numbness to left side of face, arm and leg.    Allergies No Known Allergies  Level of Care/Admitting Diagnosis ED Disposition    ED Disposition Condition [facility]: [place] [[id number]]  Level of Care: Med-Surg [16]  Covid Evaluation: Confirmed COVID Negative  Diagnosis: CAP (community acquired pneumonia) [163845]  Admitting Physician: Christel Mormon [[id number]]  Attending Physician: Christel Mormon [[id number]]  Estimated length of stay: past midnight tomorrow  Certification:: I certify this patient will need inpatient services for at least 2 midnights  PT Class (Do Not Modify): Inpatient [101]  PT Acc Code (Do Not Modify): Private [1]       B Medical/Surgery History Past Medical History:  Diagnosis Date  . Allergy   . Anxiety   . Depression   . Hyperlipidemia   . Hypertension   . Osteopenia after menopause   . Persistent headaches   . Pneumonia   . Postherpetic neuralgia   . Renal disorder   . Shingles outbreak 02/2014  . Skin cancer    moles on right groin and right buttock.   Past Surgical History:  Procedure Laterality Date  . CARDIOVASCULAR STRESS TEST  02/2009   treadmill stress test: Low risk  . [place]  . COLONOSCOPY     2014/2015 [place], normal  . EXPLORATORY LAPAROTOMY     laproscopy for infertility  . EYE SURGERY Bilateral    laser-correct opening b/tn cornea and iris  .  MOLE REMOVAL  2017   x 2 moles     A IV Location/Drains/Wounds Patient Lines/Drains/Airways Status   Active Line/Drains/Airways    Name:   Placement date:   Placement time:   Site:   Days:   Peripheral IV 09/29/18 Right Antecubital   09/29/18    2044    Antecubital   1   Peripheral IV 09/29/18 Left Antecubital   09/29/18    2045    Antecubital   1          Intake/Output Last 24 hours  Intake/Output Summary (Last 24 hours) at 09/30/2018 0007 Last data filed at 09/29/2018 2357 Gross per 24 hour  Intake 351.41 ml  Output -  Net 351.41 ml    Labs/Imaging Results for orders placed or performed during the hospital encounter of 09/29/18 (from the past 48 hour(s))  Ethanol     Status: None   Collection Time: 09/29/18  8:43 PM  Result Value Ref Range   Alcohol, Ethyl (B) <10 <10 mg/dL    Comment: (NOTE) Lowest detectable limit for serum alcohol is 10 mg/dL. For medical purposes only. Performed at [facility], [address] [place], [place] [address]   Protime-INR     Status: None   Collection Time: 09/29/18  8:43 PM  Result Value Ref Range   Prothrombin Time 12.2 11.4 - 15.2 seconds   INR 0.9 0.8 - 1.2    Comment: (NOTE)  INR goal varies based on device and disease states. Performed at [facility], [address] [place], [place] [address]   APTT     Status: None   Collection Time: 09/29/18  8:43 PM  Result Value Ref Range   aPTT 30 24 - 36 seconds    Comment: Performed at [facility], [address] [place], [place] [address]  CBC     Status: Abnormal   Collection Time: 09/29/18  8:43 PM  Result Value Ref Range   WBC 6.0 4.0 - 10.5 K/uL   RBC 4.14 3.87 - 5.11 MIL/uL   Hemoglobin 12.8 12.0 - 15.0 g/dL   HCT 37.7 36.0 - 46.0 %   MCV 91.1 80.0 - 100.0 fL   MCH 30.9 26.0 - 34.0 pg   MCHC 34.0 30.0 - 36.0 g/dL   RDW 11.1 (L) 11.5 - 15.5 %   Platelets 260 150 - 400 K/uL   nRBC 0.0 0.0 - 0.2 %    Comment: Performed at [facility], [address] [place], [place] [address]  Differential     Status: None   Collection Time: 09/29/18  8:43 PM  Result Value Ref Range   Neutrophils Relative % 59 %   Neutro Abs 3.6 1.7 - 7.7 K/uL   Lymphocytes Relative 31 %   Lymphs Abs 1.8 0.7 - 4.0 K/uL   Monocytes Relative 8 %   Monocytes Absolute 0.5 0.1 - 1.0 K/uL   Eosinophils Relative 1 %   Eosinophils Absolute 0.1 0.0 - 0.5 K/uL   Basophils Relative 1 %   Basophils Absolute 0.0 0.0 - 0.1 K/uL   Immature Granulocytes 0 %   Abs Immature Granulocytes 0.01 0.00 - 0.07 K/uL    Comment: Performed at [facility], [address] [place], [place] [address]  Comprehensive metabolic panel     Status: Abnormal   Collection Time: 09/29/18  8:43 PM  Result Value Ref Range   Sodium 127 (L) 135 - 145 mmol/L   Potassium 3.7 3.5 - 5.1 mmol/L   Chloride 89 (L) 98 - 111 mmol/L   CO2 29 22 - 32 mmol/L   Glucose, Bld 105 (H) 70 - 99 mg/dL   BUN 18 8 - 23 mg/dL   Creatinine, Ser 0.68 0.44 - 1.00 mg/dL   Calcium 8.7 (L) 8.9 - 10.3 mg/dL   Total Protein 7.6 6.5 - 8.1 g/dL   Albumin 4.4 3.5 - 5.0 g/dL   AST 32 15 - 41 U/L   ALT 27 0 - 44 U/L   Alkaline Phosphatase 62 38 - 126 U/L   Total Bilirubin 0.7 0.3 - 1.2 mg/dL   GFR calc non Af Amer >60 >60 mL/min   GFR calc Af Amer >60 >60 mL/min   Anion gap 9 5 - 15    Comment: Performed at [facility], [address] [place], [place] [address]  SARS Coronavirus 2 (CEPHEID- Performed in [facility] hospital lab), Hosp Order     Status: None   Collection Time: 09/29/18  9:55 PM   Specimen: Nasopharyngeal Swab  Result Value Ref Range   SARS Coronavirus 2 NEGATIVE NEGATIVE    Comment: (NOTE) If result is NEGATIVE SARS-CoV-2 target nucleic acids are NOT DETECTED. The SARS-CoV-2 RNA is generally detectable in upper and lower  respiratory specimens during the acute phase of infection. The lowest  concentration of SARS-CoV-2 viral copies this assay can detect is  250  copies / mL.  A negative result does not preclude SARS-CoV-2 infection  and should not be used as the sole basis for treatment or other  patient management decisions.  A negative result may occur with  improper specimen collection / handling, submission of specimen other  than nasopharyngeal swab, presence of viral mutation(s) within the  areas targeted by this assay, and inadequate number of viral copies  (<250 copies / mL). A negative result must be combined with clinical  observations, patient history, and epidemiological information. If result is POSITIVE SARS-CoV-2 target nucleic acids are DETECTED. The SARS-CoV-2 RNA is generally detectable in upper and lower  respiratory specimens dur ing the acute phase of infection.  Positive  results are indicative of active infection with SARS-CoV-2.  Clinical  correlation with patient history and other diagnostic information is  necessary to determine patient infection status.  Positive results do  not rule out bacterial infection or co-infection with other viruses. If result is PRESUMPTIVE POSTIVE SARS-CoV-2 nucleic acids MAY BE PRESENT.   A presumptive positive result was obtained on the submitted specimen  and confirmed on repeat testing.  While 2019 novel coronavirus  (SARS-CoV-2) nucleic acids may be present in the submitted sample  additional confirmatory testing may be necessary for epidemiological  and / or clinical management purposes  to differentiate between  SARS-CoV-2 and other Sarbecovirus currently known to infect humans.  If clinically indicated additional testing with an alternate test  methodology [id number]) is advised. The SARS-CoV-2 RNA is generally  detectable in upper and lower respiratory sp ecimens during the acute  phase of infection. The expected result is Negative. Fact Sheet for Patients:  StrictlyIdeas.no Fact Sheet for Healthcare  Providers: BankingDealers.co.za This test is not yet approved or cleared by the [place] FDA and has been authorized for detection and/or diagnosis of SARS-CoV-2 by FDA under an Emergency Use Authorization (EUA).  This EUA will remain in effect (meaning this test can be used) for the duration of the COVID-19 declaration under Section 564(b)(1) of the Act, 21 U.S.C. section 360bbb-3(b)(1), unless the authorization is terminated or revoked sooner. Performed at [facility], [address] [place], [place] [address]   Blood gas, arterial     Status: None   Collection Time: 09/29/18 11:38 PM  Result Value Ref Range   FIO2 100.00    pH, Arterial 7.38 7.350 - 7.450   pCO2 arterial 42 32.0 - 48.0 mmHg   pO2, Arterial 96 83.0 - 108.0 mmHg   Bicarbonate 24.8 20.0 - 28.0 mmol/L   Acid-base deficit 0.4 0.0 - 2.0 mmol/L   O2 Saturation 97.3 %   Patient temperature 37.0    Collection site RIGHT RADIAL    Sample type ARTERIAL DRAW    Allens test (pass/fail) PASS PASS    Comment: Performed at [facility], [address], [place], [place] [address]   Dg Chest Portable 1 View  Result Date: 09/29/2018 CLINICAL DATA:  Acute left face and arm tingling. Mild headache. Chest pain and shortness of breath. EXAM: PORTABLE CHEST 1 VIEW COMPARISON:  October 26, 2008 FINDINGS: The heart size is normal. The hila and mediastinum are normal. No pneumothorax. No nodules or masses. Infiltrate seen throughout the right lung, most focal in the right base. Mild interstitial prominence on the left. No other acute abnormalities. IMPRESSION: Infiltrate seen throughout the right lung, most focal in the right base with mild interstitial prominence on the left. The findings may represent diffuse infection including atypical infections. Asymmetric edema is  a possibility as well. However, there is no cardiomegaly. Electronically Signed   By: Dorise Bullion III M.D   On: 09/29/2018  21:59   Ct Head Code Stroke Wo Contrast  Addendum Date: 09/29/2018   ADDENDUM REPORT: 09/29/2018 21:17 ADDENDUM: Study discussed by telephone with Dr. Cherylann Banas on 09/29/2018 at 2113 hours. Electronically Signed   By: Genevie Ann M.D.   On: 09/29/2018 21:17   Result Date: 09/29/2018 CLINICAL DATA:  Code stroke. 63 year old female with left side numbness. Headache. EXAM: CT HEAD WITHOUT CONTRAST TECHNIQUE: Contiguous axial images were obtained from the base of the skull through the vertex without intravenous contrast. COMPARISON:  Brain MRI [age], head CT 11/17/2009. FINDINGS: Brain: Cerebral volume is stable since 2019, with mild generalized cerebral volume loss since the 2011 CT. Scattered bilateral cerebral white matter hypodensity appears similar to FLAIR signal changes in 2019. No midline shift, ventriculomegaly, mass effect, evidence of mass lesion, intracranial hemorrhage or evidence of cortically based acute infarction. Gray-white matter differentiation is within normal limits throughout the brain. Vascular: Mild Calcified atherosclerosis at the skull base. No suspicious intracranial vascular hyperdensity. Skull: No acute osseous abnormality identified. Sinuses/Orbits: Visualized paranasal sinuses and mastoids are stable and well pneumatized. Other: Visualized orbits and scalp soft tissues are within normal limits. ASPECTS [facility] Stroke Program Early CT Score) - Ganglionic level infarction (caudate, lentiform nuclei, internal capsule, insula, M1-M3 cortex): 7 - Supraganglionic infarction (M4-M6 cortex): 3 Total score (0-10 with 10 being normal): 10 IMPRESSION: 1. No acute cortically based infarct or acute intracranial hemorrhage identified. ASPECTS 10. 2. Nonspecific cerebral white matter changes appear stable to a brain MRI in 2019. Electronically Signed: By: Genevie Ann M.D. On: 09/29/2018 20:59    Pending Labs Unresulted Labs (From admission, onward)    Start     Ordered   09/30/18 0500  Hemoglobin A1c   Tomorrow morning,   STAT     09/29/18 2357   09/30/18 0500  Lipid panel  Tomorrow morning,   STAT    Comments: Fasting    09/29/18 2357   09/30/18 0500  CBC  Tomorrow morning,   STAT     09/29/18 2357   09/30/18 0500  Comprehensive metabolic panel  Tomorrow morning,   STAT     09/29/18 2357   09/29/18 2353  Legionella Pneumophila Serogp 1 Ur Ag  Once,   STAT     09/29/18 2357   09/29/18 2353  Strep pneumoniae urinary antigen  Once,   STAT     09/29/18 2357   09/29/18 2353  Culture, blood (routine x 2) Call MD if unable to obtain prior to antibiotics being given  BLOOD CULTURE X 2,   STAT    Comments: If blood cultures drawn in Emergency Department - Do not draw and cancel order    09/29/18 2357   09/29/18 2353  Culture, sputum-assessment  Once,   STAT     09/29/18 2357   09/29/18 2352  HIV antibody (Routine Screening)  Once,   STAT     09/29/18 2357   09/29/18 2219  Blood culture (routine x 2)  BLOOD CULTURE X 2,   STAT     09/29/18 2218   09/29/18 2042  Urine Drug Screen, Qualitative  Once,   STAT     09/29/18 2041   09/29/18 2042  Urinalysis, Routine w reflex microscopic  ONCE - STAT,   STAT     09/29/18 2041  Vitals/Pain Today's Vitals   09/29/18 2200 09/29/18 2234 09/29/18 2300 09/30/18 0000  BP: (!) 150/131 (!) 154/88 (!) 124/93 (!) 154/91  Pulse: 61 62 67 62  Resp: 16 20 14  (!) 22  Temp:      TempSrc:      SpO2: 91% 95% 95% 100%  Weight:      Height:      PainSc:  10-Worst pain ever      Isolation Precautions Airborne and Contact precautions  Medications Medications  atorvastatin (LIPITOR) tablet 20 mg (has no administration in time range)  propranolol (INDERAL) tablet 40 mg (has no administration in time range)  triamterene-hydrochlorothiazide (MAXZIDE-25) 37.5-25 MG per tablet 1 tablet (has no administration in time range)  escitalopram (LEXAPRO) tablet 10 mg (has no administration in time range)  QUEtiapine (SEROQUEL XR) 24 hr tablet 50 mg (has  no administration in time range)  ondansetron (ZOFRAN) tablet 4 mg (has no administration in time range)  Melatonin CAPS 3 mg (has no administration in time range)  clonazePAM (KLONOPIN) tablet 0.25 mg (has no administration in time range)  gabapentin (NEURONTIN) capsule 100-300 mg (has no administration in time range)  lamoTRIgine (LAMICTAL) tablet 100 mg (has no administration in time range)  Calcium-Vitamin D-Vitamin K 750-500-40 MG-UNT-MCG TABS (has no administration in time range)  albuterol (VENTOLIN HFA) 108 (90 Base) MCG/ACT inhaler 2 puff (has no administration in time range)  beclomethasone (QVAR) 80 MCG/ACT inhaler 1-2 puff (has no administration in time range)  montelukast (SINGULAIR) tablet 10 mg (has no administration in time range)   stroke: mapping our early stages of recovery book (has no administration in time range)  0.9 %  sodium chloride infusion (has no administration in time range)  acetaminophen (TYLENOL) tablet 650 mg (has no administration in time range)    Or  acetaminophen (TYLENOL) solution 650 mg (has no administration in time range)    Or  acetaminophen (TYLENOL) suppository 650 mg (has no administration in time range)  senna-docusate (Senokot-S) tablet 1 tablet (has no administration in time range)  enoxaparin (LOVENOX) injection 40 mg (has no administration in time range)  ondansetron (ZOFRAN) injection 4 mg (has no administration in time range)  traZODone (DESYREL) tablet 25 mg (has no administration in time range)  alum & mag hydroxide-simeth (MAALOX/MYLANTA) 200-200-20 MG/5ML suspension 30 mL (has no administration in time range)  guaiFENesin (MUCINEX) 12 hr tablet 600 mg (has no administration in time range)  ipratropium-albuterol (DUONEB) 0.5-2.5 (3) MG/3ML nebulizer solution 3 mL (has no administration in time range)  cefTRIAXone (ROCEPHIN) 2 g in sodium chloride 0.9 % 100 mL IVPB (has no administration in time range)  azithromycin (ZITHROMAX) 500 mg in  sodium chloride 0.9 % 250 mL IVPB (has no administration in time range)  aspirin chewable tablet 324 mg (324 mg Oral Given 09/29/18 2135)  methylPREDNISolone sodium succinate (SOLU-MEDROL) 125 mg/2 mL injection 125 mg (125 mg Intravenous Given 09/29/18 2147)  cefTRIAXone (ROCEPHIN) 2 g in sodium chloride 0.9 % 100 mL IVPB ( Intravenous Stopped 09/29/18 2313)  azithromycin (ZITHROMAX) 500 mg in sodium chloride 0.9 % 250 mL IVPB ( Intravenous Stopped 09/29/18 2334)  ketorolac (TORADOL) 30 MG/ML injection 15 mg (15 mg Intravenous Given 09/29/18 2256)  ipratropium-albuterol (DUONEB) 0.5-2.5 (3) MG/3ML nebulizer solution 3 mL (3 mLs Nebulization Given 09/29/18 2317)  ipratropium-albuterol (DUONEB) 0.5-2.5 (3) MG/3ML nebulizer solution 3 mL (3 mLs Nebulization Given 09/29/18 2316)  ipratropium-albuterol (DUONEB) 0.5-2.5 (3) MG/3ML nebulizer solution 3 mL (3 mLs Nebulization Given  09/30/18 0003)    Mobility walks     Focused Assessments Pulmonary Assessment Handoff:  Lung sounds:   O2 Device: Aerosol Mask O2 Flow Rate (L/min): 2 L/min      R Recommendations: See Admitting Provider Note  Report given to:   Additional Notes: husband aware

## 2018-09-30 NOTE — Progress Notes (Signed)
Pt's Neuro Checks have not changed since initial assessment this shift

## 2018-09-30 NOTE — ED Notes (Signed)
ED TO INPATIENT HANDOFF REPORT  ED Nurse Name and Phone #: Anderson Malta [phone]  S Name/Age/Gender Theresa Martin 63 y.o. female Room/Bed: ED12A/ED12A  Code Status   Code Status: Full Code  Home/SNF/Other Home Patient oriented to: self, place, time and situation Is this baseline? Yes   Triage Complete: Triage complete  Chief Complaint Left Side Numbness  Triage Note Patient ambulatory to stat desk stating that she has felt pressure in her head all day. Patient states that about an hour ago she started developing numbness to left side of face, arm and leg.    Allergies No Known Allergies  Level of Care/Admitting Diagnosis ED Disposition    ED Disposition Condition [facility] Area: [place] [[id number]]  Level of Care: Med-Surg [16]  Covid Evaluation: Confirmed COVID Negative  Diagnosis: CAP (community acquired pneumonia) [235361]  Admitting Physician: Christel Mormon [[id number]]  Attending Physician: Christel Mormon [[id number]]  Estimated length of stay: past midnight tomorrow  Certification:: I certify this patient will need inpatient services for at least 2 midnights  PT Class (Do Not Modify): Inpatient [101]  PT Acc Code (Do Not Modify): Private [1]       B Medical/Surgery History Past Medical History:  Diagnosis Date  . Allergy   . Anxiety   . Depression   . Hyperlipidemia   . Hypertension   . Osteopenia after menopause   . Persistent headaches   . Pneumonia   . Postherpetic neuralgia   . Renal disorder   . Shingles outbreak 02/2014  . Skin cancer    moles on right groin and right buttock.   Past Surgical History:  Procedure Laterality Date  . CARDIOVASCULAR STRESS TEST  02/2009   treadmill stress test: Low risk  . [place]  . COLONOSCOPY     2014/2015 [place], normal  . EXPLORATORY LAPAROTOMY     laproscopy for infertility  . EYE SURGERY Bilateral    laser-correct opening b/tn cornea and  iris  . MOLE REMOVAL  2017   x 2 moles     A IV Location/Drains/Wounds Patient Lines/Drains/Airways Status   Active Line/Drains/Airways    Name:   Placement date:   Placement time:   Site:   Days:   Peripheral IV 09/29/18 Right Antecubital   09/29/18    2044    Antecubital   1   Peripheral IV 09/29/18 Left Antecubital   09/29/18    2045    Antecubital   1          Intake/Output Last 24 hours  Intake/Output Summary (Last 24 hours) at 09/30/2018 5093 Last data filed at 09/29/2018 2357 Gross per 24 hour  Intake 351.41 ml  Output -  Net 351.41 ml    Labs/Imaging Results for orders placed or performed during the hospital encounter of 09/29/18 (from the past 48 hour(s))  Ethanol     Status: None   Collection Time: 09/29/18  8:43 PM  Result Value Ref Range   Alcohol, Ethyl (B) <10 <10 mg/dL    Comment: (NOTE) Lowest detectable limit for serum alcohol is 10 mg/dL. For medical purposes only. Performed at [facility], [address] [place], [place] [address]   Protime-INR     Status: None   Collection Time: 09/29/18  8:43 PM  Result Value Ref Range   Prothrombin Time 12.2 11.4 - 15.2 seconds   INR 0.9 0.8 - 1.2    Comment: (NOTE)  INR goal varies based on device and disease states. Performed at [facility], [address] [place], [place] [address]   APTT     Status: None   Collection Time: 09/29/18  8:43 PM  Result Value Ref Range   aPTT 30 24 - 36 seconds    Comment: Performed at [facility], [address] [place], [place] [address]  CBC     Status: Abnormal   Collection Time: 09/29/18  8:43 PM  Result Value Ref Range   WBC 6.0 4.0 - 10.5 K/uL   RBC 4.14 3.87 - 5.11 MIL/uL   Hemoglobin 12.8 12.0 - 15.0 g/dL   HCT 37.7 36.0 - 46.0 %   MCV 91.1 80.0 - 100.0 fL   MCH 30.9 26.0 - 34.0 pg   MCHC 34.0 30.0 - 36.0 g/dL   RDW 11.1 (L) 11.5 - 15.5 %   Platelets 260 150 - 400 K/uL   nRBC 0.0 0.0 - 0.2 %    Comment: Performed at  [facility], [address] [place], [place] [address]  Differential     Status: None   Collection Time: 09/29/18  8:43 PM  Result Value Ref Range   Neutrophils Relative % 59 %   Neutro Abs 3.6 1.7 - 7.7 K/uL   Lymphocytes Relative 31 %   Lymphs Abs 1.8 0.7 - 4.0 K/uL   Monocytes Relative 8 %   Monocytes Absolute 0.5 0.1 - 1.0 K/uL   Eosinophils Relative 1 %   Eosinophils Absolute 0.1 0.0 - 0.5 K/uL   Basophils Relative 1 %   Basophils Absolute 0.0 0.0 - 0.1 K/uL   Immature Granulocytes 0 %   Abs Immature Granulocytes 0.01 0.00 - 0.07 K/uL    Comment: Performed at [facility], [address] [place], [place] [address]  Comprehensive metabolic panel     Status: Abnormal   Collection Time: 09/29/18  8:43 PM  Result Value Ref Range   Sodium 127 (L) 135 - 145 mmol/L   Potassium 3.7 3.5 - 5.1 mmol/L   Chloride 89 (L) 98 - 111 mmol/L   CO2 29 22 - 32 mmol/L   Glucose, Bld 105 (H) 70 - 99 mg/dL   BUN 18 8 - 23 mg/dL   Creatinine, Ser 0.68 0.44 - 1.00 mg/dL   Calcium 8.7 (L) 8.9 - 10.3 mg/dL   Total Protein 7.6 6.5 - 8.1 g/dL   Albumin 4.4 3.5 - 5.0 g/dL   AST 32 15 - 41 U/L   ALT 27 0 - 44 U/L   Alkaline Phosphatase 62 38 - 126 U/L   Total Bilirubin 0.7 0.3 - 1.2 mg/dL   GFR calc non Af Amer >60 >60 mL/min   GFR calc Af Amer >60 >60 mL/min   Anion gap 9 5 - 15    Comment: Performed at [facility], [address], [place],  [address]  SARS Coronavirus 2 (CEPHEID- Performed in [place] hospital lab), Hosp Order     Status: None   Collection Time: 09/29/18  9:55 PM   Specimen: Nasopharyngeal Swab  Result Value Ref Range   SARS Coronavirus 2 NEGATIVE NEGATIVE    Comment: (NOTE) If result is NEGATIVE SARS-CoV-2 target nucleic acids are NOT DETECTED. The SARS-CoV-2 RNA is generally detectable in upper and lower  respiratory specimens during the acute phase of infection. The lowest  concentration of SARS-CoV-2 viral copies this assay can  detect is 250  copies / mL.  A negative result does not preclude SARS-CoV-2 infection  and should not be used as the sole basis for treatment or other  patient management decisions.  A negative result may occur with  improper specimen collection / handling, submission of specimen other  than nasopharyngeal swab, presence of viral mutation(s) within the  areas targeted by this assay, and inadequate number of viral copies  (<250 copies / mL). A negative result must be combined with clinical  observations, patient history, and epidemiological information. If result is POSITIVE SARS-CoV-2 target nucleic acids are DETECTED. The SARS-CoV-2 RNA is generally detectable in upper and lower  respiratory specimens dur ing the acute phase of infection.  Positive  results are indicative of active infection with SARS-CoV-2.  Clinical  correlation with patient history and other diagnostic information is  necessary to determine patient infection status.  Positive results do  not rule out bacterial infection or co-infection with other viruses. If result is PRESUMPTIVE POSTIVE SARS-CoV-2 nucleic acids MAY BE PRESENT.   A presumptive positive result was obtained on the submitted specimen  and confirmed on repeat testing.  While 2019 novel coronavirus  (SARS-CoV-2) nucleic acids may be present in the submitted sample  additional confirmatory testing may be necessary for epidemiological  and / or clinical management purposes  to differentiate between  SARS-CoV-2 and other Sarbecovirus currently known to infect humans.  If clinically indicated additional testing with an alternate test  methodology [phone]) is advised. The SARS-CoV-2 RNA is generally  detectable in upper and lower respiratory sp ecimens during the acute  phase of infection. The expected result is Negative. Fact Sheet for Patients:  StrictlyIdeas.no Fact Sheet for Healthcare  Providers: BankingDealers.co.za This test is not yet approved or cleared by the [place] FDA and has been authorized for detection and/or diagnosis of SARS-CoV-2 by FDA under an Emergency Use Authorization (EUA).  This EUA will remain in effect (meaning this test can be used) for the duration of the COVID-19 declaration under Section 564(b)(1) of the Act, 21 U.S.C. section 360bbb-3(b)(1), unless the authorization is terminated or revoked sooner. Performed at [facility], [address] [place], [place] [address]   Blood culture (routine x 2)     Status: None (Preliminary result)   Collection Time: 09/29/18 10:28 PM   Specimen: BLOOD  Result Value Ref Range   Specimen Description BLOOD LEFT ANTECUBITAL    Special Requests      BOTTLES DRAWN AEROBIC AND ANAEROBIC Blood Culture adequate volume   Culture      NO GROWTH < 12 HOURS Performed at [facility], [address], [place], [place] [address]    Report Status PENDING   Blood culture (routine x 2)     Status: None (Preliminary result)   Collection Time: 09/29/18 10:28 PM   Specimen: BLOOD  Result Value Ref Range   Specimen Description BLOOD RIGHT ANTECUBITAL    Special Requests      BOTTLES DRAWN AEROBIC AND ANAEROBIC Blood Culture adequate volume   Culture      NO GROWTH < 12 HOURS Performed at [facility], [address], [place], [place] [address]    Report Status PENDING   Blood gas, arterial     Status: None   Collection Time: 09/29/18 11:38 PM  Result Value Ref Range   FIO2 100.00    pH, Arterial 7.38 7.350 - 7.450   pCO2 arterial 42 32.0 - 48.0 mmHg   pO2, Arterial 96 83.0 - 108.0 mmHg  Bicarbonate 24.8 20.0 - 28.0 mmol/L   Acid-base deficit 0.4 0.0 - 2.0 mmol/L   O2 Saturation 97.3 %   Patient temperature 37.0    Collection site RIGHT RADIAL    Sample type ARTERIAL DRAW    Allens test (pass/fail) PASS PASS    Comment: Performed at [facility], [address] [place], [place] [address]  Lipid panel     Status: None   Collection Time: 09/30/18  5:49 AM  Result Value Ref Range   Cholesterol 159 0 - 200 mg/dL   Triglycerides 34 <150 mg/dL   HDL 65 >40 mg/dL   Total CHOL/HDL Ratio 2.4 RATIO   VLDL 7 0 - 40 mg/dL   LDL Cholesterol 87 0 - 99 mg/dL    Comment:        Total Cholesterol/HDL:CHD Risk Coronary Heart Disease Risk Table                     Men   Women  1/2 Average Risk   3.4   3.3  Average Risk       5.0   4.4  2 X Average Risk   9.6   7.1  3 X Average Risk  23.4   11.0        Use the calculated Patient Ratio above and the CHD Risk Table to determine the patient's CHD Risk.        ATP III CLASSIFICATION (LDL):  <100     mg/dL   Optimal  100-129  mg/dL   Near or Above                    Optimal  130-159  mg/dL   Borderline  160-189  mg/dL   High  >190     mg/dL   Very High Performed at [facility], [address] [place], [place] [address]   Culture, sputum-assessment     Status: None   Collection Time: 09/30/18  5:49 AM   Specimen: Expectorated Sputum  Result Value Ref Range   Specimen Description EXPECTORATED SPUTUM    Special Requests NONE    Sputum evaluation      Sputum specimen not acceptable for testing.  Please recollect.   C/NOEL WEBSTER AT 7169 09/30/2018.PMF Performed at [facility], [address] [place], [place] [address]    Report Status 09/30/2018 FINAL   CBC     Status: Abnormal   Collection Time: 09/30/18  5:49 AM  Result Value Ref Range   WBC 11.2 (H) 4.0 - 10.5 K/uL   RBC 4.46 3.87 - 5.11 MIL/uL   Hemoglobin 14.0 12.0 - 15.0 g/dL   HCT 40.6 36.0 - 46.0 %   MCV 91.0 80.0 - 100.0 fL   MCH 31.4 26.0 - 34.0 pg   MCHC 34.5 30.0 - 36.0 g/dL   RDW 11.0 (L) 11.5 - 15.5 %   Platelets 273 150 - 400 K/uL   nRBC 0.0 0.0 - 0.2 %    Comment: Performed at [facility], [address] [place], [place] [address]  Comprehensive metabolic panel     Status:  Abnormal   Collection Time: 09/30/18  5:49 AM  Result Value Ref Range   Sodium 124 (L) 135 - 145 mmol/L   Potassium 4.1 3.5 - 5.1 mmol/L    Comment: HEMOLYSIS AT THIS LEVEL MAY AFFECT RESULT   Chloride 88 (L) 98 - 111 mmol/L   CO2 22 22 -  32 mmol/L   Glucose, Bld 208 (H) 70 - 99 mg/dL   BUN 15 8 - 23 mg/dL   Creatinine, Ser 0.62 0.44 - 1.00 mg/dL   Calcium 8.2 (L) 8.9 - 10.3 mg/dL   Total Protein 7.0 6.5 - 8.1 g/dL   Albumin 3.6 3.5 - 5.0 g/dL   AST 38 15 - 41 U/L   ALT 25 0 - 44 U/L   Alkaline Phosphatase 63 38 - 126 U/L   Total Bilirubin 1.1 0.3 - 1.2 mg/dL   GFR calc non Af Amer >60 >60 mL/min   GFR calc Af Amer >60 >60 mL/min   Anion gap 14 5 - 15    Comment: Performed at [facility], [address] [place], [place] [address]   Dg Chest Portable 1 View  Result Date: 09/29/2018 CLINICAL DATA:  Acute left face and arm tingling. Mild headache. Chest pain and shortness of breath. EXAM: PORTABLE CHEST 1 VIEW COMPARISON:  October 26, 2008 FINDINGS: The heart size is normal. The hila and mediastinum are normal. No pneumothorax. No nodules or masses. Infiltrate seen throughout the right lung, most focal in the right base. Mild interstitial prominence on the left. No other acute abnormalities. IMPRESSION: Infiltrate seen throughout the right lung, most focal in the right base with mild interstitial prominence on the left. The findings may represent diffuse infection including atypical infections. Asymmetric edema is a possibility as well. However, there is no cardiomegaly. Electronically Signed   By: Dorise Bullion III M.D   On: 09/29/2018 21:59   Ct Head Code Stroke Wo Contrast  Addendum Date: 09/29/2018   ADDENDUM REPORT: 09/29/2018 21:17 ADDENDUM: Study discussed by telephone with Dr. Cherylann Banas on 09/29/2018 at 2113 hours. Electronically Signed   By: Genevie Ann M.D.   On: 09/29/2018 21:17   Result Date: 09/29/2018 CLINICAL DATA:  Code stroke. 63 year old female with left side numbness.  Headache. EXAM: CT HEAD WITHOUT CONTRAST TECHNIQUE: Contiguous axial images were obtained from the base of the skull through the vertex without intravenous contrast. COMPARISON:  Brain MRI 59 19, head CT 11/17/2009. FINDINGS: Brain: Cerebral volume is stable since 2019, with mild generalized cerebral volume loss since the 2011 CT. Scattered bilateral cerebral white matter hypodensity appears similar to FLAIR signal changes in 2019. No midline shift, ventriculomegaly, mass effect, evidence of mass lesion, intracranial hemorrhage or evidence of cortically based acute infarction. Gray-white matter differentiation is within normal limits throughout the brain. Vascular: Mild Calcified atherosclerosis at the skull base. No suspicious intracranial vascular hyperdensity. Skull: No acute osseous abnormality identified. Sinuses/Orbits: Visualized paranasal sinuses and mastoids are stable and well pneumatized. Other: Visualized orbits and scalp soft tissues are within normal limits. ASPECTS [facility] Stroke Program Early CT Score) - Ganglionic level infarction (caudate, lentiform nuclei, internal capsule, insula, M1-M3 cortex): 7 - Supraganglionic infarction (M4-M6 cortex): 3 Total score (0-10 with 10 being normal): 10 IMPRESSION: 1. No acute cortically based infarct or acute intracranial hemorrhage identified. ASPECTS 10. 2. Nonspecific cerebral white matter changes appear stable to a brain MRI in 2019. Electronically Signed: By: Genevie Ann M.D. On: 09/29/2018 20:59    Pending Labs Unresulted Labs (From admission, onward)    Start     Ordered   09/30/18 0700  Expectorated sputum assessment w rflx to resp cult  Once,   R     09/30/18 0700   09/30/18 0648  Culture, sputum-assessment  Once,   STAT     09/30/18 0648   09/30/18 0500  Hemoglobin A1c  Tomorrow morning,   STAT     09/29/18 2357   09/29/18 2353  Legionella Pneumophila Serogp 1 Ur Ag  Once,   STAT     09/29/18 2357   09/29/18 2353  Strep pneumoniae urinary  antigen  Once,   STAT     09/29/18 2357   09/29/18 2352  HIV antibody (Routine Screening)  Once,   STAT     09/29/18 2357   09/29/18 2042  Urine Drug Screen, Qualitative  Once,   STAT     09/29/18 2041   09/29/18 2042  Urinalysis, Routine w reflex microscopic  ONCE - STAT,   STAT     09/29/18 2041          Vitals/Pain Today's Vitals   09/30/18 0352 09/30/18 0400 09/30/18 0400 09/30/18 0656  BP:  (!) 153/97  (!) 145/73  Pulse:  65  66  Resp:  16  17  Temp:      TempSrc:      SpO2:  100%  100%  Weight:      Height:      PainSc: 10-Worst pain ever  10-Worst pain ever 10-Worst pain ever    Isolation Precautions Airborne and Contact precautions  Medications Medications  atorvastatin (LIPITOR) tablet 20 mg (20 mg Oral Refused 09/30/18 0220)  propranolol (INDERAL) tablet 40 mg (40 mg Oral Refused 09/30/18 0217)  escitalopram (LEXAPRO) tablet 10 mg (has no administration in time range)  QUEtiapine (SEROQUEL XR) 24 hr tablet 50 mg (50 mg Oral Refused 09/30/18 0225)  ondansetron (ZOFRAN) tablet 4 mg (has no administration in time range)  Melatonin TABS 5 mg (5 mg Oral Given 09/30/18 0359)  clonazePAM (KLONOPIN) tablet 0.25 mg (0.25 mg Oral Given 09/30/18 0359)  gabapentin (NEURONTIN) capsule 100-300 mg (100 mg Oral Refused 09/30/18 0219)  lamoTRIgine (LAMICTAL) tablet 100 mg (100 mg Oral Refused 09/30/18 0221)  Calcium-Vitamin D-Vitamin K 750-500-40 MG-UNT-MCG TABS (has no administration in time range)  albuterol (PROVENTIL) (2.5 MG/3ML) 0.083% nebulizer solution 2.5 mg (2.5 mg Inhalation Given 09/30/18 0520)  montelukast (SINGULAIR) tablet 10 mg (10 mg Oral Refused 09/30/18 0221)   stroke: mapping our early stages of recovery book (has no administration in time range)  0.9 %  sodium chloride infusion ( Intravenous New Bag/Given 09/30/18 0024)  acetaminophen (TYLENOL) tablet 650 mg (650 mg Oral Given 09/30/18 0246)    Or  acetaminophen (TYLENOL) solution 650 mg ( Per Tube See Alternative 09/30/18 0246)     Or  acetaminophen (TYLENOL) suppository 650 mg ( Rectal See Alternative 09/30/18 0246)  senna-docusate (Senokot-S) tablet 1 tablet (1 tablet Oral Refused 09/30/18 0222)  enoxaparin (LOVENOX) injection 40 mg (40 mg Subcutaneous Given 09/30/18 0247)  ondansetron (ZOFRAN) injection 4 mg (has no administration in time range)  traZODone (DESYREL) tablet 25 mg (has no administration in time range)  alum & mag hydroxide-simeth (MAALOX/MYLANTA) 200-200-20 MG/5ML suspension 30 mL (has no administration in time range)  guaiFENesin (MUCINEX) 12 hr tablet 600 mg (600 mg Oral Refused 09/30/18 0223)  ipratropium-albuterol (DUONEB) 0.5-2.5 (3) MG/3ML nebulizer solution 3 mL (has no administration in time range)  cefTRIAXone (ROCEPHIN) 2 g in sodium chloride 0.9 % 100 mL IVPB (2 g Intravenous Not Given 09/30/18 0250)  azithromycin (ZITHROMAX) 500 mg in sodium chloride 0.9 % 250 mL IVPB (500 mg Intravenous Not Given 09/30/18 0249)  budesonide (PULMICORT) nebulizer solution 0.25 mg (0.25 mg Nebulization Given 09/30/18 0122)  methylPREDNISolone sodium succinate (SOLU-MEDROL) 125 mg/2 mL injection 60  mg (60 mg Intravenous Given 09/30/18 0645)  aspirin EC tablet 81 mg (has no administration in time range)  aspirin chewable tablet 324 mg (324 mg Oral Given 09/29/18 2135)  methylPREDNISolone sodium succinate (SOLU-MEDROL) 125 mg/2 mL injection 125 mg (125 mg Intravenous Given 09/29/18 2147)  cefTRIAXone (ROCEPHIN) 2 g in sodium chloride 0.9 % 100 mL IVPB ( Intravenous Stopped 09/29/18 2313)  azithromycin (ZITHROMAX) 500 mg in sodium chloride 0.9 % 250 mL IVPB ( Intravenous Stopped 09/29/18 2334)  ketorolac (TORADOL) 30 MG/ML injection 15 mg (15 mg Intravenous Given 09/29/18 2256)  ipratropium-albuterol (DUONEB) 0.5-2.5 (3) MG/3ML nebulizer solution 3 mL (3 mLs Nebulization Given 09/29/18 2317)  ipratropium-albuterol (DUONEB) 0.5-2.5 (3) MG/3ML nebulizer solution 3 mL (3 mLs Nebulization Given 09/29/18 2316)  ipratropium-albuterol (DUONEB)  0.5-2.5 (3) MG/3ML nebulizer solution 3 mL (3 mLs Nebulization Given 09/30/18 0003)    Mobility walks     Focused Assessments Pulmonary Assessment Handoff:  Lung sounds: Bilateral Breath Sounds: Coarse crackles O2 Device: HeliOx Nasal Cannula O2 Flow Rate (L/min): 25 L/min      R Recommendations: See Admitting Provider Note  Report given to:   Additional Notes:

## 2018-09-30 NOTE — Progress Notes (Signed)
Pt admitted from the ED to Room 114; pt verbalized that she just returned from [facility], [place] on Monday, 09/25/2018 where she stayed in an old trailer that had mold; she called her primary physician and did a tele visit where she was instructed to go to the [facility] building on Wednesday, 09/27/2018 for a COVID test; she did this and has not received any notification regarding the COVID test results.

## 2018-09-30 NOTE — Progress Notes (Signed)
OT Cancellation Note  Patient Details Name: Theresa Martin MRN: [id number] DOB: December 20, 1955   Cancelled Treatment:    Reason Eval/Treat Not Completed: Patient at procedure or test/ unavailable. Order received, chart reviewed. Pt out of room for testing. Will re-attempt OT evaluation at later date/time as pt is available and medically appropriate.  Jeni Salles, MPH, MS, OTR/L ascom [phone] 09/30/18, 9:41 AM

## 2018-09-30 NOTE — Progress Notes (Signed)
Per verbal discussion with MD, RT advised to assess and treat patient with regards to HFNC or Bipap. Patient assessed and found to have coarse crackles throughout but no other distresses noted at this time. Patient sitting up in bed speaking full sentences. Option discussed with patient regarding HFNC and/or Bipap. Patient did state she felt nauseous at this time. HFNC was started at this time due to nausea, normal ABG results, patient ability to speak in full sentences, no distress noted at this time, normal respiration/pattern. Patient is resting comfortably in bed,  tolerating HFNC at 30LPM, 60% FIO2. RN aware. Will continue to monitor.

## 2018-09-30 NOTE — ED Notes (Signed)
Pharmacy called for reschedule of ABX and for melatonin

## 2018-09-30 NOTE — Plan of Care (Signed)
  Problem: Education: Goal: Knowledge of disease or condition will improve Description: Stroke Mapping book given Note: Stroke mapping book

## 2018-09-30 NOTE — Plan of Care (Signed)
  Problem: Education: Goal: Knowledge of disease or condition will improve Description: Stroke Mapping book given 09/30/2018 1422 by Oris Drone, RN Outcome: Progressing 09/30/2018 1422 by Oris Drone, RN Note: Stroke mapping book Goal: Knowledge of secondary prevention will improve Outcome: Progressing Goal: Knowledge of patient specific risk factors addressed and post discharge goals established will improve Outcome: Progressing   Problem: Coping: Goal: Will verbalize positive feelings about self Outcome: Progressing Goal: Will identify appropriate support needs Outcome: Progressing   Problem: Health Behavior/Discharge Planning: Goal: Ability to manage health-related needs will improve Outcome: Progressing   Problem: Self-Care: Goal: Ability to participate in self-care as condition permits will improve Outcome: Progressing   Problem: Nutrition: Goal: Risk of aspiration will decrease Outcome: Progressing   Problem: Ischemic Stroke/TIA Tissue Perfusion: Goal: Complications of ischemic stroke/TIA will be minimized Outcome: Progressing   Problem: Activity: Goal: Ability to tolerate increased activity will improve Outcome: Progressing   Problem: Clinical Measurements: Goal: Ability to maintain a body temperature in the normal range will improve Outcome: Progressing   Problem: Respiratory: Goal: Ability to maintain adequate ventilation will improve Outcome: Progressing Weaning O2 throughout the shift 5 liters on admission down to 2 liters at present Goal: Ability to maintain a clear airway will improve Outcome: Progressing

## 2018-09-30 NOTE — Progress Notes (Signed)
SLP Cancellation Note  Patient Details Name: Theresa Martin MRN: [id number] DOB: Mar 16, 1956   Cancelled treatment:       Reason Eval/Treat Not Completed: SLP screened, no needs identified, will sign off(chart reviewed; consulted NSG then met w/ pt). Per chart notes, pt does follow w/ Psychiatry for Bipolar Dis., anxiety/depression. Pt denied any difficulty swallowing and is currently on a regular diet per chart; tolerates swallowing pills w/ water per NSG. Pt stated she ate a regular diet just yesterday w/ no deficits noted. She consumed applesauce and gingerale while SLP was in the room w/ no overt s/s of aspiration noted. Pt conversed at conversational level w/out deficits noted; pt and NSG denied any speech-language deficits. Pt was also texting on the phone w/ no c/o difficulties in her communications.  No further skilled ST services indicated as pt appears at her baseline. Pt agreed. NSG to reconsult if any change in status while admitted.      Orinda Kenner, [place], CCC-SLP Watson,Katherine 09/30/2018, 10:29 AM

## 2018-09-30 NOTE — Progress Notes (Signed)
OT Cancellation Note  Patient Details Name: [name] MRN: [id number] DOB: 1955-08-19   Cancelled Treatment:    Reason Eval/Treat Not Completed: Patient at procedure or test/ unavailable. On 2nd attempt, pt out for MRI. Will re-attempt at later date/time as pt is available and medically appropriate.   Jeni Salles, MPH, MS, OTR/L ascom [phone] 09/30/18, 11:03 AM

## 2018-09-30 NOTE — Progress Notes (Signed)
OT Screen Note  Patient Details Name: Theresa Martin MRN: [id number] DOB: February 01, 1956   Cancelled Treatment:    Reason Eval/Treat Not Completed: OT screened, no needs identified, will sign off. Consult received, chart reviewed. Pt pleasant, alert, and agreeable to therapy. Pt reports her RLE feels "heavy." With testing, sensation and coordination intact. Strength relatively equal bilaterally. BUE WFL. No difficulty performing ADL tasks. No skilled OT needs. Will sign off. Please re-consult if additional needs arise.   Jeni Salles, MPH, MS, OTR/L ascom [phone] 09/30/18, 12:38 PM

## 2018-09-30 NOTE — ED Notes (Signed)
Per charge hold pt due to decompensating O2 and no ICU beds available

## 2018-09-30 NOTE — Progress Notes (Signed)
*  PRELIMINARY RESULTS* Echocardiogram 2D Echocardiogram has been performed.  [place] 09/30/2018, 6:10 PM

## 2018-09-30 NOTE — Progress Notes (Addendum)
[place] [facility] [place] NAME: Danaiya Steadman    MR#:  [id number]  DATE OF BIRTH:  07-Mar-1956  SUBJECTIVE:  CHIEF COMPLAINT:   Chief Complaint  Patient presents with  . Numbness  Seen and evaluated today Recently traveled to [place] with a group of people Has shortness of breath and cough Currently on oxygen via nasal cannula which is new requirement No complaints of any chest pain Has numbness in the left arm and right lower extremity weakness  REVIEW OF SYSTEMS:    ROS  CONSTITUTIONAL: No documented fever. Has fatigue, weakness. No weight gain, no weight loss.  EYES: No blurry or double vision.  ENT: No tinnitus. No postnasal drip. No redness of the oropharynx.  RESPIRATORY: Has cough, has wheeze, no hemoptysis. Has dyspnea.  CARDIOVASCULAR: No chest pain. No orthopnea. No palpitations. No syncope.  GASTROINTESTINAL: No nausea, no vomiting or diarrhea. No abdominal pain. No melena or hematochezia.  GENITOURINARY: No dysuria or hematuria.  ENDOCRINE: No polyuria or nocturia. No heat or cold intolerance.  HEMATOLOGY: No anemia. No bruising. No bleeding.  INTEGUMENTARY: No rashes. No lesions.  MUSCULOSKELETAL: No arthritis. No swelling. No gout.  NEUROLOGIC: No numbness, tingling, or ataxia. No seizure-type activity.  Numbness left arm PSYCHIATRIC: No anxiety. No insomnia. No ADD.   DRUG ALLERGIES:  No Known Allergies  VITALS:  Blood pressure 139/72, pulse 69, temperature 97.9 F (36.6 C), temperature source Oral, resp. rate 16, height 5\' 1"  (1.549 m), weight 52.8 kg, SpO2 100 %.  PHYSICAL EXAMINATION:   Physical Exam  GENERAL:  63 y.o.-year-old patient lying in the bed with no acute distress.  EYES: Pupils equal, round, reactive to light and accommodation. No scleral icterus. Extraocular muscles intact.  HEENT: Head atraumatic, normocephalic. Oropharynx and nasopharynx clear.  NECK:  Supple, no jugular venous distention. No  thyroid enlargement, no tenderness.  LUNGS: Decreased breath sounds bilaterally, rales heard in right lung. No use of accessory muscles of respiration.  CARDIOVASCULAR: S1, S2 normal. No murmurs, rubs, or gallops.  ABDOMEN: Soft, nontender, nondistended. Bowel sounds present. No organomegaly or mass.  EXTREMITIES: No cyanosis, clubbing or edema b/l.    NEUROLOGIC: Cranial nerves II through XII are intact. No focal Motor or sensory deficits b/l.   PSYCHIATRIC: The patient is alert and oriented x 3.  SKIN: No obvious rash, lesion, or ulcer.   LABORATORY PANEL:   CBC Recent Labs  Lab 09/30/18 0549  WBC 11.2*  HGB 14.0  HCT 40.6  PLT 273   ------------------------------------------------------------------------------------------------------------------ Chemistries  Recent Labs  Lab 09/30/18 0549  NA 124*  K 4.1  CL 88*  CO2 22  GLUCOSE 208*  BUN 15  CREATININE 0.62  CALCIUM 8.2*  AST 38  ALT 25  ALKPHOS 63  BILITOT 1.1   ------------------------------------------------------------------------------------------------------------------  Cardiac Enzymes No results for input(s): TROPONINI in the last 168 hours. ------------------------------------------------------------------------------------------------------------------  RADIOLOGY:  Dg Chest Portable 1 View  Result Date: 09/29/2018 CLINICAL DATA:  Acute left face and arm tingling. Mild headache. Chest pain and shortness of breath. EXAM: PORTABLE CHEST 1 VIEW COMPARISON:  October 26, 2008 FINDINGS: The heart size is normal. The hila and mediastinum are normal. No pneumothorax. No nodules or masses. Infiltrate seen throughout the right lung, most focal in the right base. Mild interstitial prominence on the left. No other acute abnormalities. IMPRESSION: Infiltrate seen throughout the right lung, most focal in the right base with mild interstitial prominence on the left. The findings may  represent diffuse infection including  atypical infections. Asymmetric edema is a possibility as well. However, there is no cardiomegaly. Electronically Signed   By: Dorise Bullion III M.D   On: 09/29/2018 21:59   Ct Head Code Stroke Wo Contrast  Addendum Date: 09/29/2018   ADDENDUM REPORT: 09/29/2018 21:17 ADDENDUM: Study discussed by telephone with Dr. Cherylann Banas on 09/29/2018 at 2113 hours. Electronically Signed   By: Genevie Ann M.D.   On: 09/29/2018 21:17   Result Date: 09/29/2018 CLINICAL DATA:  Code stroke. 63 year old female with left side numbness. Headache. EXAM: CT HEAD WITHOUT CONTRAST TECHNIQUE: Contiguous axial images were obtained from the base of the skull through the vertex without intravenous contrast. COMPARISON:  Brain MRI 59 19, head CT 11/17/2009. FINDINGS: Brain: Cerebral volume is stable since 2019, with mild generalized cerebral volume loss since the 2011 CT. Scattered bilateral cerebral white matter hypodensity appears similar to FLAIR signal changes in 2019. No midline shift, ventriculomegaly, mass effect, evidence of mass lesion, intracranial hemorrhage or evidence of cortically based acute infarction. Gray-white matter differentiation is within normal limits throughout the brain. Vascular: Mild Calcified atherosclerosis at the skull base. No suspicious intracranial vascular hyperdensity. Skull: No acute osseous abnormality identified. Sinuses/Orbits: Visualized paranasal sinuses and mastoids are stable and well pneumatized. Other: Visualized orbits and scalp soft tissues are within normal limits. ASPECTS [facility] Stroke Program Early CT Score) - Ganglionic level infarction (caudate, lentiform nuclei, internal capsule, insula, M1-M3 cortex): 7 - Supraganglionic infarction (M4-M6 cortex): 3 Total score (0-10 with 10 being normal): 10 IMPRESSION: 1. No acute cortically based infarct or acute intracranial hemorrhage identified. ASPECTS 10. 2. Nonspecific cerebral white matter changes appear stable to a brain MRI in 2019.  Electronically Signed: By: Genevie Ann M.D. On: 09/29/2018 20:59     ASSESSMENT AND PLAN:   63 year old female female patient with history of hyperlipidemia, hypertension, osteopenia, skin cancer, postherpetic neuralgia, depression currently under hospitalist service for shortness of breath  -Right lung community-acquired pneumonia Continue IV Rocephin and Zithromax antibiotics Mucolytic's  -Left-sided numbness with weakness right lower extremity Initial CT head was normal Check MRI brain to assess for CVA Carotid ultrasound and 2D echocardiogram with bubble study PT OT consults Continue statin  -Hyponatremia IV hydration with normal saline Check for Legionella disease Follow-up sodium level  -Dyspnea and hypoxia [place] Cov[place] test [place].  -DVT prophylaxis subcu Lovenox daily  -Hyperlipidemia Continue statin therapy  All the records are reviewed and case discussed with Care Management/Social Worker. Management plans discussed with the patient, family and they are in agreement.  CODE STATUS: Full code  DVT Prophylaxis: SCDs  TOTAL TIME TAKING CARE OF THIS PATIENT: 38 minutes.   POSSIBLE D/C IN 2 to 3 DAYS, DEPENDING ON CLINICAL CONDITION.  Saundra Shelling M.D on 09/30/2018 at 9:47 AM  Between 7am to 6pm - Pager - [phone]  After 6pm go to [email or web address] - password [email or web address] [facility]  [facility] [place] [facility]  Office  [phone]  CC: Primary care physician; [name], MD  Note: This dictation was prepared with Dragon dictation along with smaller phrase technology. Any transcriptional errors that result from this process are unintentional.

## 2018-09-30 NOTE — Progress Notes (Signed)
Telephone call from [facility] advising that per their telephone call to [place] to check results on the 09/27/2018 [facility] testing no results to date on the 3-5 day testing; will advise if and when results come back

## 2018-10-01 LAB — CBC
HCT: 35.2 % — ABNORMAL LOW (ref 36.0–46.0)
Hemoglobin: 11.9 g/dL — ABNORMAL LOW (ref 12.0–15.0)
MCH: 30.9 pg (ref 26.0–34.0)
MCHC: 33.8 g/dL (ref 30.0–36.0)
MCV: 91.4 fL (ref 80.0–100.0)
Platelets: 253 10*3/uL (ref 150–400)
RBC: 3.85 MIL/uL — ABNORMAL LOW (ref 3.87–5.11)
RDW: 11.4 % — ABNORMAL LOW (ref 11.5–15.5)
WBC: 16.8 10*3/uL — ABNORMAL HIGH (ref 4.0–10.5)
nRBC: 0 % (ref 0.0–0.2)

## 2018-10-01 LAB — BASIC METABOLIC PANEL
Anion gap: 6 (ref 5–15)
BUN: 14 mg/dL (ref 8–23)
CO2: 26 mmol/L (ref 22–32)
Calcium: 8.4 mg/dL — ABNORMAL LOW (ref 8.9–10.3)
Chloride: 106 mmol/L (ref 98–111)
Creatinine, Ser: 0.55 mg/dL (ref 0.44–1.00)
GFR calc Af Amer: >60 mL/min (ref >60)
GFR calc non Af Amer: >60 mL/min (ref >60)
Glucose, Bld: 152 mg/dL — ABNORMAL HIGH (ref 70–99)
Potassium: 3.8 mmol/L (ref 3.5–5.1)
Sodium: 138 mmol/L (ref 135–145)

## 2018-10-01 LAB — ECHOCARDIOGRAM COMPLETE
Height: 61 in
Weight: 1860.8 oz

## 2018-10-01 MED ORDER — LEVOFLOXACIN 500 MG PO TABS: 500.0000 mg | ORAL_TABLET | Freq: Every day | ORAL | 0 refills | Status: AC

## 2018-10-01 MED ORDER — PREDNISONE 10 MG PO TABS: 10.0000 mg | ORAL_TABLET | Freq: Every day | ORAL | 0 refills | Status: DC

## 2018-10-01 MED ORDER — PNEUMOCOCCAL VAC POLYVALENT 25 MCG/0.5ML IJ INJ: 0.5000 mL | INJECTION | INTRAMUSCULAR | Status: DC

## 2018-10-01 NOTE — Progress Notes (Signed)
Propranolol held overnightdue to pulse and pressure, pnt reports she was taking for pulse related to anxiety.  Gardiner Barefoot gave order to hold.

## 2018-10-01 NOTE — Plan of Care (Signed)
  Problem: Education: Goal: Knowledge of disease or condition will improve Description: Stroke Mapping book given Outcome: Progressing Goal: Knowledge of secondary prevention will improve Outcome: Progressing Goal: Knowledge of patient specific risk factors addressed and post discharge goals established will improve Outcome: Progressing   Problem: Coping: Goal: Will verbalize positive feelings about self Outcome: Progressing Goal: Will identify appropriate support needs Outcome: Progressing   Problem: Health Behavior/Discharge Planning: Goal: Ability to manage health-related needs will improve Outcome: Progressing   Problem: Health Behavior/Discharge Planning: Goal: Ability to manage health-related needs will improve Outcome: Progressing   Problem: Self-Care: Goal: Ability to participate in self-care as condition permits will improve Outcome: Progressing   Problem: Nutrition: Goal: Risk of aspiration will decrease Outcome: Progressing   Problem: Ischemic Stroke/TIA Tissue Perfusion: Goal: Complications of ischemic stroke/TIA will be minimized Outcome: Progressing   Problem: Activity: Goal: Ability to tolerate increased activity will improve Outcome: Progressing   Problem: Clinical Measurements: Goal: Ability to maintain a body temperature in the normal range will improve Outcome: Progressing   Problem: Respiratory: Goal: Ability to maintain adequate ventilation will improve Outcome: Progressing Goal: Ability to maintain a clear airway will improve Outcome: Progressing

## 2018-10-01 NOTE — Progress Notes (Signed)
[facility]'s [facility]; pt discharged via wheelchair by nursing to the [facility] entrance 

## 2018-10-01 NOTE — Discharge Summary (Signed)
[facility] [place] NAME: Theresa Martin    MR#:  [id number]  DATE OF BIRTH:  1956/03/20  DATE OF ADMISSION:  09/29/2018 ADMITTING PHYSICIAN: Christel Mormon, MD  DATE OF DISCHARGE: 10/01/2018  PRIMARY CARE PHYSICIAN: [name], MD   ADMISSION DIAGNOSIS:  TIA (transient ischemic attack) [G45.9] Cerebrovascular accident (CVA), unspecified mechanism ([place]) [I63.9] Exacerbation of asthma, unspecified asthma severity, unspecified whether persistent [J45.901]  DISCHARGE DIAGNOSIS:  Active Problems:   CAP (community acquired pneumonia) Bronchial asthma exacerbation Dehydration Hyponatremia  SECONDARY DIAGNOSIS:   Past Medical History:  Diagnosis Date  . Allergy   . Anxiety   . Depression   . Hyperlipidemia   . Hypertension   . Osteopenia after menopause   . Persistent headaches   . Pneumonia   . Postherpetic neuralgia   . Renal disorder   . Shingles outbreak 02/2014  . Skin cancer    moles on right groin and right buttock.     ADMITTING HISTORY Theresa Martin  is a 63 y.o. Caucasian female with a known history of multiple medical problems that are mentioned below, who presented to the emergency room with acute onset of left upper and lower extremity numbness without weakness that started earlier today and has been getting better without headache or dizziness or blurred vision dysarthria or dysphagia.  She denies any chest pain or palpitations.  She has been having worsening cough that has been mainly dry over the last couple of days with associated dyspnea and wheezing.  She denied any fever or chills. Upon presentation to the emergency room temperature was 97.2 and blood pressure was elevated 196/92 otherwise normal vital signs.  Labs revealed hyponatremia 127 and hypokalemia of 89.  COVID-19 test came back negative.  ABG showed pH 7.38, PCO2 42, PO2 of 96 bicarbonate 24.8 and O2 sat of 97% on 100% nonrebreather.  Portable  chest x-ray revealed suspected right-sided pneumonia with mild interstitial prominence on the left.  It showed cardiomegaly.  EKG showed normal sinus rhythm with rate of 60 with T wave inversion laterally and poor R wave progression.  Head CT scan revealed no acute intracranial abnormalities. The patient was given 4 baby aspirin, IV Rocephin and Zithromax as well as 3 duo nebs.  She will be admitted to a medically monitored bed for further evaluation and management.   HOSPITAL COURSE:  Patient was admitted to medical floor and started on IV Rocephin and Zithromax antibiotics.  Was given aspirin worked up for stroke.  Initial CT head in the emergency room showed no acute abnormality.  She was worked up with MRI brain which showed demyelination but no stroke or hemorrhage.  Patient was worked up with echocardiogram and carotid ultrasound.  Carotid ultrasound did not show any hemodynamically significant stenosis.  Patient shortness of breath cough resolved.  COVID-19 test was negative.  She was weaned off oxygen.  Patient will be discharged home follow-up with outpatient primary care physician and neurology services as needed. Cultures did not reveal any growth.  Patient received IV Solu-Medrol and nebulization therapy for bronchial asthma exacerbation.  Shortness of breath and wheezing also improved. CONSULTS OBTAINED:  Treatment Team:  Catarina Hartshorn, MD  DRUG ALLERGIES:  No Known Allergies  DISCHARGE MEDICATIONS:   Allergies as of 10/01/2018   No Known Allergies     Medication List    STOP taking these medications   ibuprofen 200 MG tablet Commonly known as: ADVIL   propranolol 40  MG tablet Commonly known as: INDERAL     TAKE these medications   albuterol 108 (90 Base) MCG/ACT inhaler Commonly known as: VENTOLIN HFA Inhale 2 puffs into the lungs every 6 (six) hours as needed for wheezing or shortness of breath.   atorvastatin 20 MG tablet Commonly known as: LIPITOR TAKE 1  TABLET BY MOUTH EVERY DAY   beclomethasone 80 MCG/ACT inhaler Commonly known as: QVAR Inhale 1-2 puffs into the lungs 2 (two) times daily.   CALCIUM + D + K PO Take 3 each by mouth daily. Calcium 557m- Vitamin D 1000iu- K 435m  clonazePAM 0.5 MG tablet Commonly known as: KLONOPIN Take 0.25 mg by mouth at bedtime as needed.   ELDERBERRY PO Take by mouth.   escitalopram 10 MG tablet Commonly known as: LEXAPRO Take 10 mg by mouth daily.   FLUOCINOLONE CREAM & EMOLLIENT EX Apply topically.   gabapentin 100 MG capsule Commonly known as: NEURONTIN Take 1-3 capsules (100-300 mg total) by mouth 2 (two) times daily.   Ketoconazole-Hydrocortisone 2 & 1 % Kit Apply topically as needed. Cracks on mouth   lamoTRIgine 100 MG tablet Commonly known as: LAMICTAL TAKE 10081mn the morning and 100 mg by mouth before bed.   levofloxacin 500 MG tablet Commonly known as: Levaquin Take 1 tablet (500 mg total) by mouth daily for 5 days.   Melatonin 3 MG Caps Take 1 capsule by mouth at bedtime.   montelukast 10 MG tablet Commonly known as: SINGULAIR Take 1 tablet (10 mg total) by mouth at bedtime.   MOVE FREE PO Take 1 tablet by mouth daily.   Naproxen Sodium 220 MG Caps Commonly known as: Aleve As needed for headache   NEOMYCIN-POLYMYXIN-HYDROCORTISONE 1 % Soln OTIC solution Commonly known as: CORTISPORIN Apply 1-2 drops to toe bid after soaking   ondansetron 4 MG tablet Commonly known as: ZOFRAN Take 1 tablet (4 mg total) by mouth every 8 (eight) hours as needed for nausea or vomiting.   predniSONE 10 MG tablet Commonly known as: DELTASONE Take 1 tablet (10 mg total) by mouth daily. Label  & dispense according to the schedule below.  6 tablets day one, then 5 table day 2, then 4 tablets day 3, then 3 tablets day 4, 2 tablets day 5, then 1 tablet day 6, then stop   QUEtiapine 50 MG Tb24 24 hr tablet Commonly known as: SEROQUEL XR Take 50 mg by mouth at bedtime.    traZODone 50 MG tablet Commonly known as: DESYREL Take 50 mg by mouth at bedtime as needed for sleep.   triamterene-hydrochlorothiazide 37.5-25 MG tablet Commonly known as: MAXZIDE-25 TAKE 1 TABLET BY MOUTH EVERY DAY       Today  Patient seen today No shortness of breath Weaned off oxygen No wheezing Hemodynamically stable  VITAL SIGNS:  Blood pressure 116/61, pulse 66, temperature 98.4 F (36.9 C), temperature source Oral, resp. rate 16, height 5' 1"  (1.549 m), weight 52.8 kg, SpO2 98 %.  I/O:    Intake/Output Summary (Last 24 hours) at 10/01/2018 1228 Last data filed at 10/01/2018 1115 Gross per 24 hour  Intake 3173.87 ml  Output -  Net 3173.87 ml    PHYSICAL EXAMINATION:  Physical Exam  GENERAL:  [age].-year-old patient lying in the bed with no acute distress.  LUNGS: Normal breath sounds bilaterally, no wheezing, rales,rhonchi or crepitation. No use of accessory muscles of respiration.  CARDIOVASCULAR: S1, S2 normal. No murmurs, rubs, or gallops.  ABDOMEN:  Soft, non-tender, non-distended. Bowel sounds present. No organomegaly or mass.  NEUROLOGIC: Moves all 4 extremities. PSYCHIATRIC: The patient is alert and oriented x 3.  SKIN: No obvious rash, lesion, or ulcer.   DATA REVIEW:   CBC Recent Labs  Lab 10/01/18 0545  WBC 16.8*  HGB 11.9*  HCT 35.2*  PLT 253    Chemistries  Recent Labs  Lab 09/30/18 0549 10/01/18 0545  NA 124* 138  K 4.1 3.8  CL 88* 106  CO2 22 26  GLUCOSE 208* 152*  BUN 15 14  CREATININE 0.62 0.55  CALCIUM 8.2* 8.4*  AST 38  --   ALT 25  --   ALKPHOS 63  --   BILITOT 1.1  --     Cardiac Enzymes No results for input(s): TROPONINI in the last 168 hours.  Microbiology Results  Results for orders placed or performed during the hospital encounter of 09/29/18  SARS Coronavirus 2 (CEPHEID- Performed in [facility] hospital lab), Hosp Order     Status: None   Collection Time: 09/29/18  9:55 PM   Specimen: Nasopharyngeal Swab   Result Value Ref Range Status   SARS Coronavirus 2 NEGATIVE NEGATIVE Final    Comment: (NOTE) If result is NEGATIVE SARS-CoV-2 target nucleic acids are NOT DETECTED. The SARS-CoV-2 RNA is generally detectable in upper and lower  respiratory specimens during the acute phase of infection. The lowest  concentration of SARS-CoV-2 viral copies this assay can detect is 250  copies / mL. A negative result does not preclude SARS-CoV-2 infection  and should not be used as the sole basis for treatment or other  patient management decisions.  A negative result may occur with  improper specimen collection / handling, submission of specimen other  than nasopharyngeal swab, presence of viral mutation(s) within the  areas targeted by this assay, and inadequate number of viral copies  (<250 copies / mL). A negative result must be combined with clinical  observations, patient history, and epidemiological information. If result is POSITIVE SARS-CoV-2 target nucleic acids are DETECTED. The SARS-CoV-2 RNA is generally detectable in upper and lower  respiratory specimens dur ing the acute phase of infection.  Positive  results are indicative of active infection with SARS-CoV-2.  Clinical  correlation with patient history and other diagnostic information is  necessary to determine patient infection status.  Positive results do  not rule out bacterial infection or co-infection with other viruses. If result is PRESUMPTIVE POSTIVE SARS-CoV-2 nucleic acids MAY BE PRESENT.   A presumptive positive result was obtained on the submitted specimen  and confirmed on repeat testing.  While 2019 novel coronavirus  (SARS-CoV-2) nucleic acids may be present in the submitted sample  additional confirmatory testing may be necessary for epidemiological  and / or clinical management purposes  to differentiate between  SARS-CoV-2 and other Sarbecovirus currently known to infect humans.  If clinically indicated additional  testing with an alternate test  methodology [phone]) is advised. The SARS-CoV-2 RNA is generally  detectable in upper and lower respiratory sp ecimens during the acute  phase of infection. The expected result is Negative. Fact Sheet for Patients:  StrictlyIdeas.no Fact Sheet for Healthcare Providers: BankingDealers.co.za This test is not yet approved or cleared by the [place] FDA and has been authorized for detection and/or diagnosis of SARS-CoV-2 by FDA under an Emergency Use Authorization (EUA).  This EUA will remain in effect (meaning this test can be used) for the duration of the COVID-19 declaration under Section  564(b)(1) of the Act, 21 U.S.C. section 360bbb-3(b)(1), unless the authorization is terminated or revoked sooner. Performed at [facility], [address] [place], [place] [address]   Blood culture (routine x 2)     Status: None (Preliminary result)   Collection Time: 09/29/18 10:28 PM   Specimen: BLOOD  Result Value Ref Range Status   Specimen Description BLOOD LEFT ANTECUBITAL  Final   Special Requests   Final    BOTTLES DRAWN AEROBIC AND ANAEROBIC Blood Culture adequate volume   Culture   Final    NO GROWTH 2 DAYS Performed at [facility], [address], [place], [place] [address]    Report Status PENDING  Incomplete  Blood culture (routine x 2)     Status: None (Preliminary result)   Collection Time: 09/29/18 10:28 PM   Specimen: BLOOD  Result Value Ref Range Status   Specimen Description BLOOD RIGHT ANTECUBITAL  Final   Special Requests   Final    BOTTLES DRAWN AEROBIC AND ANAEROBIC Blood Culture adequate volume   Culture   Final    NO GROWTH 2 DAYS Performed at [facility], [address], [place], [place] [address]    Report Status PENDING  Incomplete  Culture, sputum-assessment     Status: None   Collection Time: 09/30/18  5:49 AM   Specimen: Expectorated Sputum   Result Value Ref Range Status   Specimen Description EXPECTORATED SPUTUM  Final   Special Requests NONE  Final   Sputum evaluation   Final    Sputum specimen not acceptable for testing.  Please recollect.   C/NOEL WEBSTER AT 1157 09/30/2018.PMF Performed at [facility], [address] [place], [place] [address]    Report Status 09/30/2018 FINAL  Final    RADIOLOGY:  Mr Brain Wo Contrast  Result Date: 09/30/2018 CLINICAL DATA:  Left sided numbness without weakness beginning earlier today. History of hypertension and hyperlipidemia. EXAM: MRI HEAD WITHOUT CONTRAST TECHNIQUE: Multiplanar, multiecho pulse sequences of the brain and surrounding structures were obtained without intravenous contrast. COMPARISON:  CT and carotid ultrasound yesterday and today. MRI 08/28/2017 and 05/20/2004. FINDINGS: Brain: Diffusion imaging does not show any acute or subacute infarction. No brainstem or cerebellar abnormality is seen. Cerebral hemispheres show multiple foci of T2 and FLAIR signal affecting the white matter including periventricular foci. The appearance could certainly be due to demyelinating disease/multiple sclerosis, but given a history of hypertension and hyperlipidemia may also be ordinary small-vessel disease. The findings do not appear noticeably progressive since June of 2019. No mass lesion, hemorrhage, hydrocephalus or extra-axial collection. Vascular: Major vessels at the base of the brain show flow. Skull and upper cervical spine: Negative Sinuses/Orbits: Clear/normal Other: None IMPRESSION: No acute or subacute infarction. Numerous scattered foci of T2 and FLAIR signal within the cerebral hemispheric deep and subcortical white matter. The differential diagnosis on the basis of the imaging would be demyelinating disease versus small-vessel disease. Findings appear stable since June of last year but are progressive since 2006. Given the history of hypertension and hyperlipidemia,  small-vessel disease is probably more likely. Electronically Signed   By: Nelson Chimes M.D.   On: 09/30/2018 11:24   US Carotid Bilateral (at [facility] Only)  Result Date: 09/30/2018 CLINICAL DATA:  TIA symptoms, hypertension, hyperlipidemia EXAM: BILATERAL CAROTID DUPLEX ULTRASOUND TECHNIQUE: Pearline Cables scale imaging, color Doppler and duplex ultrasound were performed of bilateral carotid and vertebral arteries in the neck. COMPARISON:  None. FINDINGS: Criteria: Quantification of  carotid stenosis is based on velocity parameters that correlate the residual internal carotid diameter with NASCET-based stenosis levels, using the diameter of the distal internal carotid lumen as the denominator for stenosis measurement. The following velocity measurements were obtained: RIGHT ICA: 117/34 cm/sec CCA: 16/10 cm/sec SYSTOLIC ICA/CCA RATIO:  1.8 ECA: 78 cm/sec LEFT ICA: 109/30 cm/sec CCA: 96/04 cm/sec SYSTOLIC ICA/CCA RATIO:  1.6 ECA: 85 cm/sec RIGHT CAROTID ARTERY: Minor echogenic shadowing plaque formation. No hemodynamically significant right ICA stenosis, velocity elevation, or turbulent flow. Degree of narrowing less than 50%. RIGHT VERTEBRAL ARTERY:  Antegrade LEFT CAROTID ARTERY: Similar scattered minor echogenic plaque formation. No hemodynamically significant left ICA stenosis, velocity elevation, or turbulent flow. LEFT VERTEBRAL ARTERY:  Antegrade IMPRESSION: Minor carotid atherosclerosis. No hemodynamically significant ICA stenosis. Degree of narrowing less than 50% bilaterally by ultrasound criteria. Patent antegrade vertebral flow bilaterally Electronically Signed   By: Jerilynn Mages.  Shick M.D.   On: 09/30/2018 09:50   Dg Chest Portable 1 View  Result Date: 09/29/2018 CLINICAL DATA:  Acute left face and arm tingling. Mild headache. Chest pain and shortness of breath. EXAM: PORTABLE CHEST 1 VIEW COMPARISON:  October 26, 2008 FINDINGS: The heart size is normal. The hila and mediastinum are normal. No pneumothorax. No nodules  or masses. Infiltrate seen throughout the right lung, most focal in the right base. Mild interstitial prominence on the left. No other acute abnormalities. IMPRESSION: Infiltrate seen throughout the right lung, most focal in the right base with mild interstitial prominence on the left. The findings may represent diffuse infection including atypical infections. Asymmetric edema is a possibility as well. However, there is no cardiomegaly. Electronically Signed   By: Dorise Bullion III M.D   On: 09/29/2018 21:59   Ct Head Code Stroke Wo Contrast  Addendum Date: 09/29/2018   ADDENDUM REPORT: 09/29/2018 21:17 ADDENDUM: Study discussed by telephone with Dr. Cherylann Banas on 09/29/2018 at 2113 hours. Electronically Signed   By: Genevie Ann M.D.   On: 09/29/2018 21:17   Result Date: 09/29/2018 CLINICAL DATA:  Code stroke. 63 year old female with left side numbness. Headache. EXAM: CT HEAD WITHOUT CONTRAST TECHNIQUE: Contiguous axial images were obtained from the base of the skull through the vertex without intravenous contrast. COMPARISON:  Brain MRI 59 19, head CT 11/17/2009. FINDINGS: Brain: Cerebral volume is stable since 2019, with mild generalized cerebral volume loss since the 2011 CT. Scattered bilateral cerebral white matter hypodensity appears similar to FLAIR signal changes in 2019. No midline shift, ventriculomegaly, mass effect, evidence of mass lesion, intracranial hemorrhage or evidence of cortically based acute infarction. Gray-white matter differentiation is within normal limits throughout the brain. Vascular: Mild Calcified atherosclerosis at the skull base. No suspicious intracranial vascular hyperdensity. Skull: No acute osseous abnormality identified. Sinuses/Orbits: Visualized paranasal sinuses and mastoids are stable and well pneumatized. Other: Visualized orbits and scalp soft tissues are within normal limits. ASPECTS [facility] Stroke Program Early CT Score) - Ganglionic level infarction (caudate, lentiform  nuclei, internal capsule, insula, M1-M3 cortex): 7 - Supraganglionic infarction (M4-M6 cortex): 3 Total score (0-10 with 10 being normal): 10 IMPRESSION: 1. No acute cortically based infarct or acute intracranial hemorrhage identified. ASPECTS 10. 2. Nonspecific cerebral white matter changes appear stable to a brain MRI in 2019. Electronically Signed: By: Genevie Ann M.D. On: 09/29/2018 20:59    Follow up with PCP in 1 week.  Management plans discussed with the patient, family and they are in agreement.  CODE STATUS: Full code    Code Status Orders  (  From admission, onward)         Start     Ordered   09/29/18 2354  Full code  Continuous     09/29/18 2357        Code Status History    This patient has a current code status but no historical code status.   Advance Care Planning Activity      TOTAL TIME TAKING CARE OF THIS PATIENT ON DAY OF DISCHARGE: more than 35 minutes.   Saundra Shelling M.D on 10/01/2018 at 12:28 PM  Between 7am to 6pm - Pager - [phone]  After 6pm go to [email or web address] - password [email or web address] [facility]  [facility] [place] [facility]  Office  [phone]  CC: Primary care physician; [name], MD  Note: This dictation was prepared with Dragon dictation along with smaller phrase technology. Any transcriptional errors that result from this process are unintentional.

## 2018-10-02 ENCOUNTER — Telehealth: Payer: Self-pay

## 2018-10-02 LAB — LEGIONELLA PNEUMOPHILA SEROGP 1 UR AG: L. pneumophila Serogp 1 Ur Ag: NEGATIVE

## 2018-10-02 NOTE — Telephone Encounter (Signed)
Pt called to report on 09/29/18 she reported to [facility] ED for CVA/TIA workup. In the hospital it was recommended she stop her propranolol due to low heart rate. Pt wanted to confirm if she should stop the propranolol all together or take 1 tab a week for so many weeks then d/c? Pt advised I would fwd to Dr. Gladstone Lighter nurse.

## 2018-10-02 NOTE — Telephone Encounter (Signed)
I called pt that per DR.Penumalli he recommends she takes advice of hospital MD and stop taking the propranolol. PT verbalized understanding. I tried to schedule pt with Dr. Rosaura Carpenter but his schedule for July did not have any open slots.I offer pt a earlier appt at 0830  with Amy NP week of July 20 to 24 but pt decline it. Pt stated she needs early afternoon. Pt made for 7/23 at 1100am. Pt aware of new appt and new check in process.Marland Kitchen

## 2018-10-02 NOTE — Telephone Encounter (Signed)
I spoke with Dr.Penumalli and he reviewed the phone note from pt. He stated patient should stop the propranolol based on the hospital recommendations. She can be seen for a earlier appt to discuss another form of headache management medications with Amy NP.

## 2018-10-02 NOTE — Telephone Encounter (Signed)
Attempted to contact patient to complete TCM and confirm hospital f/u appt.

## 2018-10-03 ENCOUNTER — Ambulatory Visit: Payer: Medicare Other | Admitting: Family Medicine

## 2018-10-03 LAB — HIV ANTIBODY (ROUTINE TESTING W REFLEX): HIV Screen 4th Generation wRfx: NONREACTIVE

## 2018-10-03 NOTE — Telephone Encounter (Signed)
Transition Care Management Follow-up Telephone Call  DATE OF ADMISSION:  09/29/2018       DATE OF DISCHARGE: 10/01/2018 DISCHARGE DIAGNOSIS:  CAP (community acquired pneumonia), Bronchial asthma exacerbation, Dehydration and Hyponatremia   How have you been since you were released from the hospital? "Much better today"   Do you understand why you were in the hospital? yes   Do you understand the discharge instructions? yes   Where were you discharged to? Home.    Items Reviewed:  Medications reviewed: yes  Allergies reviewed: yes  Dietary changes reviewed: yes  Referrals reviewed: yes. F/U with Neuro in August.    Functional Questionnaire:   Activities of Daily Living (ADLs):   She states they are independent in the following: ambulation, bathing and hygiene, feeding, continence, grooming, toileting and dressing States they require assistance with the following: None.    Any transportation issues/concerns?: no   Any patient concerns? no   Confirmed importance and date/time of follow-up visits scheduled yes  Provider Appointment booked with PCP 10/11/2018 via VV.   Confirmed with patient if condition begins to worsen call PCP or go to the ER.  Patient was given the office number and encouraged to call back with question or concerns.  : yes

## 2018-10-04 ENCOUNTER — Other Ambulatory Visit: Payer: Self-pay | Admitting: Family Medicine

## 2018-10-04 LAB — CULTURE, BLOOD (ROUTINE X 2)
Culture: NO GROWTH
Culture: NO GROWTH
Special Requests: ADEQUATE
Special Requests: ADEQUATE

## 2018-10-04 LAB — NOVEL CORONAVIRUS, NAA: SARS-CoV-2, NAA: NOT DETECTED

## 2018-10-11 ENCOUNTER — Ambulatory Visit: Payer: Medicare Other | Admitting: Podiatry

## 2018-10-11 ENCOUNTER — Ambulatory Visit (INDEPENDENT_AMBULATORY_CARE_PROVIDER_SITE_OTHER): Payer: Medicare Other | Admitting: Family Medicine

## 2018-10-11 ENCOUNTER — Other Ambulatory Visit: Payer: Self-pay

## 2018-10-11 ENCOUNTER — Encounter: Payer: Self-pay | Admitting: Family Medicine

## 2018-10-11 VITALS — Ht 61.0 in | Wt 110.0 lb

## 2018-10-11 DIAGNOSIS — F411 Generalized anxiety disorder: Secondary | ICD-10-CM | POA: Diagnosis not present

## 2018-10-11 DIAGNOSIS — E871 Hypo-osmolality and hyponatremia: Secondary | ICD-10-CM

## 2018-10-11 DIAGNOSIS — D72829 Elevated white blood cell count, unspecified: Secondary | ICD-10-CM | POA: Diagnosis not present

## 2018-10-11 DIAGNOSIS — J189 Pneumonia, unspecified organism: Secondary | ICD-10-CM | POA: Diagnosis not present

## 2018-10-11 DIAGNOSIS — F3181 Bipolar II disorder: Secondary | ICD-10-CM | POA: Diagnosis not present

## 2018-10-11 NOTE — Progress Notes (Signed)
Virtual Visit via Video   I connected with patient on 10/11/18 at 10:00 AM EDT by a video enabled telemedicine application and verified that I am speaking with the correct person using two identifiers.  Location patient: Home Location provider: [facility]up[facility]ist, Office Persons participating in the virtual visit: Patient, Provider, Lindsay [name]  I discussed the limitations of evaluation and management by telemedicine and the availability of in person appointments. The patient expressed understanding and agreed to proceed.  Subjective:   HPI:   Hospital f/u- pt present to ER on 7/3 w/ TIA (L sided weakness) and cough.  Her BP was elevated, Na 127.  CXR showed R PNA and cardiomegaly.  CT and MRI showed no evidence of CVA.  She was tx'd w/ Rocephin and Zithromax for PNA.  COVID (-), cultures negative.  She ws transitioned to Levaquin for d/c.  BP was 116/61 at D/c.  Na had normalized to 138.  Ca was mildly low at 8.4.  WBC was elevated at 16.8.  She was d/c'd 7/5.  Today pt reports 'I'm a lot better'.  Has finished Levaquin.  Denies cough.  Denies SOB.  If she does feel tight, she will use inhaler.  Pt reports weakness has resolved.  Pt has neuro f/u August 5th.  Pt reports she has had recurrent PNA 'always on R side' and inpt team recommended a pulmonary appt.  Denies CP, HAs, visual changes, edema.  Pt was at [facility] the week before she developed PNA.  She had negative COVID test which reassured her.  I reviewed the H&P, d/c summary, labs, images, consults.  Reviewed past medical, surgical, family and social histories.   ROS:   See pertinent positives and negatives per HPI.  Patient Active Problem List   Diagnosis Date Noted  . CAP (community acquired pneumonia) 09/29/2018  . Nonallopathic lesion of thoracic region 09/05/2018  . Nonallopathic lesion of rib cage 09/05/2018  . Nonallopathic lesion of cervical region 09/05/2018  . Nonallopathic lesion of lumbosacral region  09/05/2018  . Nonallopathic lesion of sacral region 09/05/2018  . Skin cancer   . Osteopenia after menopause   . Chronic right shoulder pain 03/30/2017  . Right shoulder pain 01/12/2017  . Tendinopathy of rotator cuff 12/20/2016  . Angle-closure glaucoma, severe stage 08/19/2015  . Osteopenia 06/19/2015  . Migraine without aura and without status migrainosus, not intractable 01/13/2015  . High risk medications (not anticoagulants) long-term use 09/26/2014  . Breast signs and symptoms 02/08/2014  . Routine general medical examination at a health care facility 07/27/2012  . Hyperlipidemia 09/29/2010  . URETEROPELVIC JUNCTION OBSTRUCTION, CONGENITAL 12/15/2009  . CONSTIPATION 12/11/2009  . CARDIAC MURMUR 11/26/2008  . MAGNETIC RESONANCE IMAGING, BRAIN, ABNORMAL 10/29/2008  . VENEREAL WART 10/23/2008  . Major depressive disorder, recurrent episode ([place]) 10/23/2008  . COMMON MIGRAINE 10/23/2008  . Essential hypertension 10/23/2008  . ALLERGIC RHINITIS 10/23/2008  . ASTHMA, INTERMITTENT, MILD 10/23/2008  . RENAL CALCULUS, HX OF 10/23/2008    Social History   Tobacco Use  . Smoking status: Never Smoker  . Smokeless tobacco: Never Used  Substance Use Topics  . Alcohol use: No    Alcohol/week: 0.0 standard drinks    Current Outpatient Medications:  .  albuterol (VENTOLIN HFA) 108 (90 Base) MCG/ACT inhaler, Inhale 2 puffs into the lungs every 6 (six) hours as needed for wheezing or shortness of breath., Disp: 1 Inhaler, Rfl: 2 .  atorvastatin (LIPITOR) 20 MG tablet, TAKE 1 TABLET BY MOUTH EVERY  DAY, Disp: 30 tablet, Rfl: 6 .  beclomethasone (QVAR) 80 MCG/ACT inhaler, Inhale 1-2 puffs into the lungs 2 (two) times daily., Disp: 10.6 g, Rfl: 0 .  Calcium-Vitamin D-Vitamin K (CALCIUM + D + K PO), Take 3 each by mouth daily. Calcium 528m- Vitamin D 1000iu- K 435m, Disp: , Rfl:  .  clonazePAM (KLONOPIN) 0.5 MG tablet, Take 0.25 mg by mouth at bedtime as needed. , Disp: , Rfl: 2 .   ELDERBERRY PO, Take by mouth., Disp: , Rfl:  .  escitalopram (LEXAPRO) 10 MG tablet, Take 10 mg by mouth daily. , Disp: , Rfl:  .  Fluocinolone-Emollient (FLUOCINOLONE CREAM & EMOLLIENT EX), Apply topically., Disp: , Rfl:  .  gabapentin (NEURONTIN) 100 MG capsule, Take 1-3 capsules (100-300 mg total) by mouth 2 (two) times daily., Disp: 180 capsule, Rfl: 12 .  Glucosamine-Chondroitin (MOVE FREE PO), Take 1 tablet by mouth daily., Disp: , Rfl:  .  Ketoconazole-Hydrocortisone 2 & 1 % KIT, Apply topically as needed. Cracks on mouth, Disp: , Rfl:  .  lamoTRIgine (LAMICTAL) 100 MG tablet, TAKE 1004mn the morning and 100 mg by mouth before bed., Disp: , Rfl:  .  Melatonin 3 MG CAPS, Take 1 capsule by mouth at bedtime., Disp: , Rfl:  .  montelukast (SINGULAIR) 10 MG tablet, Take 1 tablet (10 mg total) by mouth at bedtime., Disp: 30 tablet, Rfl: 3 .  Naproxen Sodium (ALEVE) 220 MG CAPS, As needed for headache, Disp: 60 each, Rfl:  .  NEOMYCIN-POLYMYXIN-HYDROCORTISONE (CORTISPORIN) 1 % SOLN OTIC solution, Apply 1-2 drops to toe bid after soaking, Disp: 10 mL, Rfl: 1 .  QUEtiapine (SEROQUEL XR) 50 MG TB24 24 hr tablet, Take 50 mg by mouth at bedtime. , Disp: , Rfl:  .  traZODone (DESYREL) 50 MG tablet, Take 50 mg by mouth at bedtime as needed for sleep. , Disp: , Rfl:  .  triamterene-hydrochlorothiazide (MAXZIDE-25) 37.5-25 MG tablet, TAKE 1 TABLET BY MOUTH EVERY DAY, Disp: 30 tablet, Rfl: 6  No Known Allergies  Objective:   Ht _0  (1.549 m)   Wt 110 lb (49.9 kg)   BMI 20.78 kg/m   AAOx3, NAD NCAT, EOMI No obvious CN deficits Coloring WNL Pt is able to speak clearly, coherently without shortness of breath or increased work of breathing.  Thought process is linear.  Mood is appropriate.   Assessment and Plan:   CAP- new to provider.  Pt seemingly had sxs of COVID but 2 tests were negative.  Treated as CAP w/ Levaquin.  Pt reports feeling much better.  Would like to see pulmonary as she  reports recurrent PNAs on R side.  Referral placed.  Hypocalcemia- check labs.  Replete prn  Hyponatremia- was stable at time of d/c.  Will repeat to ensure pt is drinking enough fluids   Leukocytosis- new.  Suspect this is due to her CAP.  Repeat CBC to ensure things are trending down.   KatAnnye AsaD 10/11/2018

## 2018-10-11 NOTE — Progress Notes (Signed)
I have discussed the procedure for the virtual visit with the patient who has given consent to proceed with assessment and treatment.   Pt states that she is feeling much better.  Unable to get vitals except for weight today.   Davis Gourd, CMA

## 2018-10-12 ENCOUNTER — Other Ambulatory Visit: Payer: Self-pay

## 2018-10-12 ENCOUNTER — Other Ambulatory Visit (INDEPENDENT_AMBULATORY_CARE_PROVIDER_SITE_OTHER): Payer: Medicare Other

## 2018-10-12 ENCOUNTER — Other Ambulatory Visit: Payer: Medicare Other

## 2018-10-12 DIAGNOSIS — D72829 Elevated white blood cell count, unspecified: Secondary | ICD-10-CM | POA: Diagnosis not present

## 2018-10-12 DIAGNOSIS — E871 Hypo-osmolality and hyponatremia: Secondary | ICD-10-CM

## 2018-10-12 LAB — BASIC METABOLIC PANEL
BUN: 12 mg/dL (ref 6–23)
CO2: 34 mEq/L — ABNORMAL HIGH (ref 19–32)
Calcium: 9 mg/dL (ref 8.4–10.5)
Chloride: 101 mEq/L (ref 96–112)
Creatinine, Ser: 0.76 mg/dL (ref 0.40–1.20)
GFR: 76.67 mL/min (ref >60.00)
Glucose, Bld: 118 mg/dL — ABNORMAL HIGH (ref 70–99)
Potassium: 3.9 mEq/L (ref 3.5–5.1)
Sodium: 140 mEq/L (ref 135–145)

## 2018-10-12 LAB — CBC WITH DIFFERENTIAL/PLATELET
Basophils Absolute: 0.1 10*3/uL (ref 0.0–0.1)
Basophils Relative: 0.8 % (ref 0.0–3.0)
Eosinophils Absolute: 0.4 10*3/uL (ref 0.0–0.7)
Eosinophils Relative: 5.9 % — ABNORMAL HIGH (ref 0.0–5.0)
HCT: 38.5 % (ref 36.0–46.0)
Hemoglobin: 12.9 g/dL (ref 12.0–15.0)
Lymphocytes Relative: 34.4 % (ref 12.0–46.0)
Lymphs Abs: 2.1 10*3/uL (ref 0.7–4.0)
MCHC: 33.4 g/dL (ref 30.0–36.0)
MCV: 94.5 fl (ref 78.0–100.0)
Monocytes Absolute: 0.4 10*3/uL (ref 0.1–1.0)
Monocytes Relative: 5.9 % (ref 3.0–12.0)
Neutro Abs: 3.2 10*3/uL (ref 1.4–7.7)
Neutrophils Relative %: 53 % (ref 43.0–77.0)
Platelets: 279 10*3/uL (ref 150.0–400.0)
RBC: 4.07 Mil/uL (ref 3.87–5.11)
RDW: 12.5 % (ref 11.5–15.5)
WBC: 6.1 10*3/uL (ref 4.0–10.5)

## 2018-10-13 DIAGNOSIS — H6123 Impacted cerumen, bilateral: Secondary | ICD-10-CM | POA: Diagnosis not present

## 2018-10-13 DIAGNOSIS — H9190 Unspecified hearing loss, unspecified ear: Secondary | ICD-10-CM | POA: Diagnosis not present

## 2018-10-17 DIAGNOSIS — H93293 Other abnormal auditory perceptions, bilateral: Secondary | ICD-10-CM | POA: Diagnosis not present

## 2018-10-19 ENCOUNTER — Encounter: Payer: Self-pay | Admitting: Family Medicine

## 2018-10-19 ENCOUNTER — Ambulatory Visit (INDEPENDENT_AMBULATORY_CARE_PROVIDER_SITE_OTHER): Payer: Medicare Other | Admitting: Family Medicine

## 2018-10-19 ENCOUNTER — Other Ambulatory Visit: Payer: Self-pay

## 2018-10-19 VITALS — BP 147/62 | HR 60 | Temp 97.7°F | Ht 61.0 in | Wt 112.8 lb

## 2018-10-19 DIAGNOSIS — R200 Anesthesia of skin: Secondary | ICD-10-CM | POA: Diagnosis not present

## 2018-10-19 DIAGNOSIS — G43009 Migraine without aura, not intractable, without status migrainosus: Secondary | ICD-10-CM

## 2018-10-19 NOTE — Progress Notes (Signed)
PATIENT: Theresa Martin DOB: 25-Jan-1956  REASON FOR VISIT: follow up HISTORY FROM: patient  Chief Complaint  Patient presents with   Follow-up    Room 2, alone. Migraine f/u.      HISTORY OF PRESENT ILLNESS: Today 10/22/18 Theresa Martin is a 63 y.o. female here today for follow up migraines. She was recently admitted to [facility] on 7/3. She had a sudden onset of left sided numbness. She states that her left side and bilateral feet were numb. She denies weakness. No changes in speech, gait or mental status. She also had cough and felt very fatigued. She was diagnosed with right sided PNA and treated with multiple abx. NA was 127 on admission and BP was 196/92. Head CT and MRI were negative for stroke. Numbness resolved completely within 12 hours. While hospitalized, she reports bradycardia. She was discharged home and advised to discontinue propranolol for migraine prevention. She has felt much better since being home. She denies headaches. She denies any new numbness or weakness. She has been seen by PCP who referred her for pulmonary evaluation due to recurrent PNA.   MRI did show concerns for demyelination versus small vessel disease. Previous MS workup with Dr Leta Baptist was negative. She does have long standing history of HTN and HLD. She was started on 25m aspirin daily.    HISTORY: (copied from Dr PGladstone Lighternote on 01/30/2018)  UPDATE (01/30/18, VRP): Since last visit, doing worse with HA (14-15 per month). More stress levels than last visit. Burning sensation in scalp continues.  UPDATE (08/24/17, VRP): Since last visit, doing well. Tolerating propranolol. Avg 4-13 migraine per month. No alleviating or aggravating factors.   Separately, had right thoracic burning pain (no rash) in fall 2018, and was tx'd empirically with acyclovir. Then with right scalp burning pain last week; also with chronic left scalp sensitivity with migraines.  UPDATE 08/24/16: Since last  visit, avg 2-3 HA per month. Tolerating meds. No new issues. Mood stable. Weight improved.   UPDATE 02/23/16: Since last visit has ~ 1-5 HA per month. Tolerating propranolol + OTC tylenol/aleve for HA mgmt.   UPDATE 08/19/15: Since last visit, avg 1-10 HA per month. Usually with stress. Some more wt gain noted (~10lbs). Also had a a laser eye procedure for angle closure glaucoma, now improved.  UPDATE 04/15/15: Since last visit, doing well. No more migraine since 02/28/15. Propranolol is helping prevent HA. Mood stable.  UPDATE 01/13/15: Since last visit, having more headaches, and more fluid retention. Now being tapered off gabapentin by psychiatry due to side effects.   UPDATE 09/02/14: Doing well. Avg 3-6 days HA per month. Some HA are mild, some are severe. Taking gabapentin 2063mTID. [place] every day at [employer] mood is better as well.  UPDATE 05/17/14: Since last visit, patient had some psychiatry issues, was managed by behavioral health, and her headaches significant improved. She was doing well for several years and did not follow-up in our clinic. Patient here with her father today for this visit. Patient's father notes that her headaches seem to be significantly associated with her stress levels. In the last few months her stress levels have increased significantly. Her stress levels related to her ex-husband and her daughters, and some family issues. Patient having intermittent, almost daily left-sided headaches, with numbness and tingling in the left scalp, typically in the evening when she is home. During the daytime patient stays active, works out several times a week, and does  better. Patient had been on gabapentin 100 mg at bedtime for several years. Her psychiatrist increased this to 2 and then 3 capsules at bedtime. When she did 3 capsules at bedtime, her headache type symptoms paradoxical he worsened. She then went back to taking one capsule at bedtime. Strangely, she  reports that she was told she was able to take this medication (gabapentin) as needed as well and for the last few weeks has been taking one capsule at bedtime, followed by 1-2 capsules every hour, throughout the night, taking up to 10-14 capsules in a 12 hour period.   PRIOR HPI (11/04/08 - 06/03/09, VRP): 63 year old right-handed female with history of high blood pressure, depression and anxiety presenting for evaluation of chronic headaches and abnormal MRI scan.  On October 26, 2008 the patient presented to the emergency room for right-sided chest pain and was diagnosed with pneumonia.   At this time her headaches worsened in severity.  Upon review of prior MRIs demonstrating microvascular gliosis, patient was referred to our neurology clinic for futher evaluation. Patient's headaches began in 2005 consisting of a "dull pressure like sensation" over the top of her head. Occasionally these are associated with mild nausea without vomiting as well as intermittent left facial numbness.  Patient had dull nagging low level headaches on a daily basis with daily flareups involving a hot sensation over her scalp.  Sometimes this sensation starts as posterior neck pain that moves up the back of her head. She was initially evaluated with MRI scans in 2005 and 2006 and started on topiramate 100 mg at bedtime.   In addition she was taking Advil and Tylenol over-the-counter almost a daily basis.   During flareups the patient would have to lay down in place ice packs over her eyes and the top of her head.   Patient's headaches are aggravated by lack of sleep or stress. No food triggers noted.  Her other physicians decided to taper her off of NSAIDs, tylenol and also her topiramate. Patient had MRI of the head and neck, found to have some nonspecific white matter lesions. Patient had lumbar puncture and additional blood testing, but multiple sclerosis was ruled out. Patient was treated with several occipital nerve blocks with good  results.  REVIEW OF SYSTEMS: Out of a complete 14 system review of symptoms, the patient complains only of the following symptoms, numbness, headaches and all other reviewed systems are negative.  ALLERGIES: No Known Allergies  HOME MEDICATIONS: Outpatient Medications Prior to Visit  Medication Sig Dispense Refill   albuterol (VENTOLIN HFA) 108 (90 Base) MCG/ACT inhaler Inhale 2 puffs into the lungs every 6 (six) hours as needed for wheezing or shortness of breath. 1 Inhaler 2   atorvastatin (LIPITOR) 20 MG tablet TAKE 1 TABLET BY MOUTH EVERY DAY 30 tablet 6   beclomethasone (QVAR) 80 MCG/ACT inhaler Inhale 1-2 puffs into the lungs 2 (two) times daily. 10.6 g 0   Calcium-Vitamin D-Vitamin K (CALCIUM + D + K PO) Take 3 each by mouth daily. Calcium 548m- Vitamin D 1000iu- K 419m     clonazePAM (KLONOPIN) 0.5 MG tablet Take 0.25 mg by mouth at bedtime as needed.   2   ELDERBERRY PO Take by mouth.     escitalopram (LEXAPRO) 10 MG tablet Take 10 mg by mouth daily.      gabapentin (NEURONTIN) 100 MG capsule Take 1-3 capsules (100-300 mg total) by mouth 2 (two) times daily. (Patient taking differently: Take 100-300 mg by mouth  2 (two) times daily. 100 in the morning and 200 at night.) 180 capsule 12   Glucosamine-Chondroitin (MOVE FREE PO) Take 1 tablet by mouth daily.     Ketoconazole-Hydrocortisone 2 & 1 % KIT Apply topically as needed. Cracks on mouth     lamoTRIgine (LAMICTAL) 100 MG tablet TAKE 177m in the morning and 100 mg by mouth before bed.     Melatonin 3 MG CAPS Take 1 capsule by mouth at bedtime.     montelukast (SINGULAIR) 10 MG tablet Take 1 tablet (10 mg total) by mouth at bedtime. 30 tablet 3   Naproxen Sodium (ALEVE) 220 MG CAPS As needed for headache 60 each    NEOMYCIN-POLYMYXIN-HYDROCORTISONE (CORTISPORIN) 1 % SOLN OTIC solution Apply 1-2 drops to toe bid after soaking 10 mL 1   traZODone (DESYREL) 50 MG tablet Take 50 mg by mouth at bedtime as needed for  sleep.      triamterene-hydrochlorothiazide (MAXZIDE-25) 37.5-25 MG tablet TAKE 1 TABLET BY MOUTH EVERY DAY 30 tablet 6   Fluocinolone-Emollient (FLUOCINOLONE CREAM & EMOLLIENT EX) Apply topically.     QUEtiapine (SEROQUEL XR) 50 MG TB24 24 hr tablet Take 50 mg by mouth at bedtime.      No facility-administered medications prior to visit.     PAST MEDICAL HISTORY: Past Medical History:  Diagnosis Date   Allergy    Anxiety    Depression    Hyperlipidemia    Hypertension    Osteopenia after menopause    Persistent headaches    Pneumonia    Postherpetic neuralgia    Renal disorder    Shingles outbreak 02/2014   Skin cancer    moles on right groin and right buttock.    PAST SURGICAL HISTORY: Past Surgical History:  Procedure Laterality Date   CARDIOVASCULAR STRESS TEST  02/2009   treadmill stress test: Low risk   CESAREAN SECTION  1987, 1989   COLONOSCOPY     2014/2015 Anton, normal   EXPLORATORY LAPAROTOMY     laproscopy for infertility   EYE SURGERY Bilateral    laser-correct opening b/tn cornea and iris   MOLE REMOVAL  2017   x 2 moles    FAMILY HISTORY: Family History  Problem Relation Age of Onset   Hypertension Mother    Coronary artery disease Mother    Heart failure Mother    Hypertension Father    Liver disease Sister        liver failure   Arrhythmia Brother    Hypertension Brother    Cancer Neg Hx        colon    Breast cancer Neg Hx     SOCIAL HISTORY: Social History   Socioeconomic History   Marital status: Legally Separated    Spouse name: Not on file   Number of children: 2   Years of education: Not on file   Highest education level: Not on file  Occupational History   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 Social Needs   Financial resource strain: Not on file   Food insecurity    Worry: Not on file    Inability: Not on file   Transportation needs    Medical: Not on file    Non-medical: Not on  file  Tobacco Use   Smoking status: Never Smoker   Smokeless tobacco: Never Used  Substance and Sexual Activity   Alcohol use: No    Alcohol/week: 0.0 standard drinks   Drug use: No   Sexual activity: Not  on file  Lifestyle   Physical activity    Days per week: Not on file    Minutes per session: Not on file   Stress: Not on file  Relationships   Social connections    Talks on phone: Not on file    Gets together: Not on file    Attends religious service: Not on file    Active member of club or organization: Not on file    Attends meetings of clubs or organizations: Not on file    Relationship status: Not on file   Intimate partner violence    Fear of current or ex partner: Not on file    Emotionally abused: Not on file    Physically abused: Not on file    Forced sexual activity: Not on file  Other Topics Concern   Not on file  Social History Narrative   Regular exercise- yes, spins daily   Diet: fruits and veggies, water    No caffeine      PHYSICAL EXAM  Vitals:   10/19/18 1121  BP: (!) 147/62  Pulse: 60  Temp: 97.7 F (36.5 C)  Weight: 112 lb 12.8 oz (51.2 kg)  Height: 5' 1"  (1.549 m)   Body mass index is 21.31 kg/m.  Generalized: Well developed, in no acute distress  Cardiology: normal rate and rhythm, no murmur noted Neurological examination  Mentation: Alert oriented to time, place, history taking. Follows all commands speech and language fluent Cranial nerve II-XII: Pupils were equal round reactive to light. Extraocular movements were full, visual field were full on confrontational test. Facial sensation and strength were normal. Uvula tongue midline. Head turning and shoulder shrug  were normal and symmetric. Motor: The motor testing reveals 5 over 5 strength of all 4 extremities. Good symmetric motor tone is noted throughout.  Sensory: Sensory testing is intact to soft touch on all 4 extremities. No evidence of extinction is noted.    Coordination: Cerebellar testing reveals good finger-nose-finger and heel-to-shin bilaterally.  Gait and station: Gait is normal. Tandem gait is normal. Romberg is negative. No drift is seen.    DIAGNOSTIC DATA (LABS, IMAGING, TESTING) - I reviewed patient records, labs, notes, testing and imaging myself where available.  MMSE - Mini Mental State Exam 06/03/2016  Orientation to time 5  Orientation to Place 5  Registration 3  Attention/ Calculation 5  Recall 3  Language- name 2 objects 2  Language- repeat 1  Language- follow 3 step command 3  Language- read & follow direction 1  Write a sentence 1  Copy design 1  Total score 30     Lab Results  Component Value Date   WBC 6.1 10/12/2018   HGB 12.9 10/12/2018   HCT 38.5 10/12/2018   MCV 94.5 10/12/2018   PLT 279.0 10/12/2018      Component Value Date/Time   NA 140 10/12/2018 0943   K 3.9 10/12/2018 0943   CL 101 10/12/2018 0943   CO2 34 (H) 10/12/2018 0943   GLUCOSE 118 (H) 10/12/2018 0943   BUN 12 10/12/2018 0943   CREATININE 0.76 10/12/2018 0943   CREATININE 0.90 06/08/2017 1534   CALCIUM 9.0 10/12/2018 0943   PROT 7.0 09/30/2018 0549   ALBUMIN 3.6 09/30/2018 0549   AST 38 09/30/2018 0549   ALT 25 09/30/2018 0549   ALKPHOS 63 09/30/2018 0549   BILITOT 1.1 09/30/2018 0549   GFRNONAA >60 10/01/2018 0545   GFRAA >60 10/01/2018 0545   Lab Results  Component Value Date   CHOL 159 09/30/2018   HDL 65 09/30/2018   LDLCALC 87 09/30/2018   TRIG 34 09/30/2018   CHOLHDL 2.4 09/30/2018   Lab Results  Component Value Date   HGBA1C 5.3 09/30/2018   Lab Results  Component Value Date   WPVXYIAX65 537 01/16/2010   Lab Results  Component Value Date   TSH 2.02 09/01/2018     ASSESSMENT AND PLAN 63 y.o. year old female  has a past medical history of Allergy, Anxiety, Depression, Hyperlipidemia, Hypertension, Osteopenia after menopause, Persistent headaches, Pneumonia, Postherpetic neuralgia, Renal disorder,  Shingles outbreak (02/2014), and Skin cancer. here with     ICD-10-CM   1. Migraine without aura and without status migrainosus, not intractable  G43.009   2. Left sided numbness  R20.0     Theresa Martin is feeling well today. We have discussed side effects of propranolol and other alternatives to treat migraines. She is not interested in preventative management at this time. She has felt well with no headaches/migraines and wishes to continue without prevention therapy at this time. Numbness has completely resolved. We have discuss possible causative events with elevated BP, low sodium and infectious process. She had LP in the past that was negative. We will continue to monitor symptoms closely. She was advised to monitor BP closely. Continued follow up with PCP for BP and lipid management advised. Continue daily asa 59m. She will follow up in 3 months, sooner if needed.    No orders of the defined types were placed in this encounter.    No orders of the defined types were placed in this encounter.     I spent 25 minutes with the patient. 50% of this time was spent counseling and educating patient on plan of care and medications.    ADebbora Presto FNP-C 10/22/2018, 11:27 AM [facility] [address] [place] [phone]

## 2018-10-19 NOTE — Patient Instructions (Addendum)
Discontinue propranolol  Follow up closely for worsening headaches/migraines  Stroke prevention (monitor BP and screening labs regularly with PCP)  Follow up in 3 months  Migraine Headache A migraine headache is a very strong throbbing pain on one side or both sides of your head. This type of headache can also cause other symptoms. It can last from 4 hours to 3 days. Talk with your doctor about what things may bring on (trigger) this condition. What are the causes? The exact cause of this condition is not known. This condition may be triggered or caused by:  Drinking alcohol.  Smoking.  Taking medicines, such as: ? Medicine used to treat chest pain (nitroglycerin). ? Birth control pills. ? Estrogen. ? Some blood pressure medicines.  Eating or drinking certain products.  Doing physical activity. Other things that may trigger a migraine headache include:  Having a menstrual period.  Pregnancy.  Hunger.  Stress.  Not getting enough sleep or getting too much sleep.  Weather changes.  Tiredness (fatigue). What increases the risk?  Being 52-71 years old.  Being female.  Having a family history of migraine headaches.  Being Caucasian.  Having depression or anxiety.  Being very overweight. What are the signs or symptoms?  A throbbing pain. This pain may: ? Happen in any area of the head, such as on one side or both sides. ? Make it hard to do daily activities. ? Get worse with physical activity. ? Get worse around bright lights or loud noises.  Other symptoms may include: ? Feeling sick to your stomach (nauseous). ? Vomiting. ? Dizziness. ? Being sensitive to bright lights, loud noises, or smells.  Before you get a migraine headache, you may get warning signs (an aura). An aura may include: ? Seeing flashing lights or having blind spots. ? Seeing bright spots, halos, or zigzag lines. ? Having tunnel vision or blurred vision. ? Having numbness or a  tingling feeling. ? Having trouble talking. ? Having weak muscles.  Some people have symptoms after a migraine headache (postdromal phase), such as: ? Tiredness. ? Trouble thinking (concentrating). How is this treated?  Taking medicines that: ? Relieve pain. ? Relieve the feeling of being sick to your stomach. ? Prevent migraine headaches.  Treatment may also include: ? Having acupuncture. ? Avoiding foods that bring on migraine headaches. ? Learning ways to control your body functions (biofeedback). ? Therapy to help you know and deal with negative thoughts (cognitive behavioral therapy). Follow these instructions at home: Medicines  Take over-the-counter and prescription medicines only as told by your doctor.  Ask your doctor if the medicine prescribed to you: ? Requires you to avoid driving or using heavy machinery. ? Can cause trouble pooping (constipation). You may need to take these steps to prevent or treat trouble pooping:  Drink enough fluid to keep your pee (urine) pale yellow.  Take over-the-counter or prescription medicines.  Eat foods that are high in fiber. These include beans, whole grains, and fresh fruits and vegetables.  Limit foods that are high in fat and sugar. These include fried or sweet foods. Lifestyle  Do not drink alcohol.  Do not use any products that contain nicotine or tobacco, such as cigarettes, e-cigarettes, and chewing tobacco. If you need help quitting, ask your doctor.  Get at least 8 hours of sleep every night.  Limit and deal with stress. General instructions      Keep a journal to find out what may bring on your migraine headaches.  For example, write down: ? What you eat and drink. ? How much sleep you get. ? Any change in what you eat or drink. ? Any change in your medicines.  If you have a migraine headache: ? Avoid things that make your symptoms worse, such as bright lights. ? It may help to lie down in a dark, quiet  room. ? Do not drive or use heavy machinery. ? Ask your doctor what activities are safe for you.  Keep all follow-up visits as told by your doctor. This is important. Contact a doctor if:  You get a migraine headache that is different or worse than others you have had.  You have more than 15 headache days in one month. Get help right away if:  Your migraine headache gets very bad.  Your migraine headache lasts longer than 72 hours.  You have a fever.  You have a stiff neck.  You have trouble seeing.  Your muscles feel weak or like you cannot control them.  You start to lose your balance a lot.  You start to have trouble walking.  You pass out (faint).  You have a seizure. Summary  A migraine headache is a very strong throbbing pain on one side or both sides of your head. These headaches can also cause other symptoms.  This condition may be treated with medicines and changes to your lifestyle.  Keep a journal to find out what may bring on your migraine headaches.  Contact a doctor if you get a migraine headache that is different or worse than others you have had.  Contact your doctor if you have more than 15 headache days in a month. This information is not intended to replace advice given to you by your health care provider. Make sure you discuss any questions you have with your health care provider. Document Released: 12/23/2007 Document Revised: 07/07/2018 Document Reviewed: 04/27/2018 Elsevier Patient Education  2020 [employer].

## 2018-10-22 ENCOUNTER — Encounter: Payer: Self-pay | Admitting: Family Medicine

## 2018-10-22 DIAGNOSIS — R200 Anesthesia of skin: Secondary | ICD-10-CM | POA: Insufficient documentation

## 2018-10-24 ENCOUNTER — Telehealth: Payer: Self-pay | Admitting: Internal Medicine

## 2018-10-24 NOTE — Telephone Encounter (Signed)
Called patient for COVID-19 pre-screening for in office visit. ° °Have you recently traveled any where out of the local area in the last 2 weeks? No ° °Have you been in close contact with a person diagnosed with COVID-19 or someone awaiting results within the last 2 weeks? No ° °Do you currently have any of the following symptoms? If so, when did they start? °Cough     Diarrhea   Joint Pain °Fever      Muscle Pain   Red eyes °Shortness of breath   Abdominal pain  Vomiting °Loss of smell    Rash    Sore Throat °Headache    Weakness   Bruising or bleeding ° ° °Okay to proceed with visit 10/24/2018  ° ° °

## 2018-10-24 NOTE — Progress Notes (Signed)
I reviewed note and agree with plan.   Penni Bombard, MD 12/25/901, 0:14 AM Certified in Neurology, Neurophysiology and Neuroimaging  [facility] [address], [place], [place] [address] [phone]

## 2018-10-25 ENCOUNTER — Ambulatory Visit (INDEPENDENT_AMBULATORY_CARE_PROVIDER_SITE_OTHER): Payer: Medicare Other | Admitting: Internal Medicine

## 2018-10-25 ENCOUNTER — Encounter: Payer: Self-pay | Admitting: Internal Medicine

## 2018-10-25 ENCOUNTER — Other Ambulatory Visit: Payer: Self-pay

## 2018-10-25 VITALS — BP 120/62 | HR 98 | Temp 97.3°F | Ht 61.0 in | Wt 114.4 lb

## 2018-10-25 DIAGNOSIS — I639 Cerebral infarction, unspecified: Secondary | ICD-10-CM

## 2018-10-25 DIAGNOSIS — J4520 Mild intermittent asthma, uncomplicated: Secondary | ICD-10-CM

## 2018-10-25 DIAGNOSIS — R9389 Abnormal findings on diagnostic imaging of other specified body structures: Secondary | ICD-10-CM | POA: Diagnosis not present

## 2018-10-25 NOTE — Progress Notes (Signed)
Name: Theresa Martin MRN: [id number] DOB: March 08, 1956     CONSULTATION DATE: 10/25/2018 REFERRING MD : Birdie Riddle  CHIEF COMPLAINT: abnormal CXR  HISTORY OF PRESENT ILLNESS: 63 year old white female seen today for recent admission for pneumonia on July 3 Patient had right-sided chest pain with pleurisy with chest congestions and a cough with wheezing but no fevers She was seen in the ER and was admitted for 2 days and was diagnosed with pneumonia consistent with a right-sided opacification on chest x-ray was given IV antibiotics and nebulized treatments and oxygen therapy  Patient was discharged after 2 days She was prescribed Qvar inhaled steroid and at this time feels better  Patient has been diagnosed with asthma in the past and was using albuterol inhaler as needed She has had 5 pneumonias in the past Triggers include smoke and perfumes and colognes  She is a non-smoker but 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 at this time  No signs of infection at this time No signs of exacerbation at this time No acute respiratory distress at this time  Overall she feels better  I have reviewed the CT of the chest from 10 years ago which shows a right middle lobe opacification consistent with pneumonia and her current chest x-ray done July 3 also reveals a right middle lobe opacification  I have explained to the patient this could be chronic inflammation from underlying atypical infection Malignancy is a low probability at this time However she will need CT chest for further evaluation and will also probably need airway evaluation with bronchoscopy which was explained to the patient   PAST MEDICAL HISTORY :   has a past medical history of Allergy, Anxiety, Depression, Hyperlipidemia, Hypertension, Osteopenia after menopause, Persistent headaches, Pneumonia, Postherpetic neuralgia, Renal disorder, Shingles outbreak (02/2014), and Skin  cancer.  has a past surgical history that includes Cesarean section (1987, 1989); Exploratory laparotomy; Cardiovascular stress test (02/2009); Mole removal (2017); Eye surgery (Bilateral); and Colonoscopy. Prior to Admission medications   Medication Sig Start Date End Date Taking? Authorizing Provider  albuterol (VENTOLIN HFA) 108 (90 Base) MCG/ACT inhaler Inhale 2 puffs into the lungs every 6 (six) hours as needed for wheezing or shortness of breath. 08/10/18   [name], MD  atorvastatin (LIPITOR) 20 MG tablet TAKE 1 TABLET BY MOUTH EVERY DAY 04/10/18   [name], MD  beclomethasone (QVAR) 80 MCG/ACT inhaler Inhale 1-2 puffs into the lungs 2 (two) times daily. 09/27/18   Brunetta Jeans, PA-C  Calcium-Vitamin D-Vitamin K (CALCIUM + D + K PO) Take 3 each by mouth daily. Calcium 594m- Vitamin D 1000iu- K 46m    [provider]  clonazePAM (KLONOPIN) 0.5 MG tablet Take 0.25 mg by mouth at bedtime as needed.  01/12/14   [provider]  ELDERBERRY PO Take by mouth.    [provider]  escitalopram (LEXAPRO) 10 MG tablet Take 10 mg by mouth daily.     [provider]  gabapentin (NEURONTIN) 100 MG capsule Take 1-3 capsules (100-300 mg total) by mouth 2 (two) times daily. Patient taking differently: Take 100-300 mg by mouth 2 (two) times daily. 100 in the morning and 200 at night. 01/30/18   Penumalli, ViEarlean PolkaMD  Glucosamine-Chondroitin (MOVE FREE PO) Take 1 tablet by mouth daily.    [provider]  Ketoconazole-Hydrocortisone 2 & 1 % KIT Apply topically as needed. Cracks on mouth  [provider]  lamoTRIgine (LAMICTAL) 100 MG tablet TAKE 153m in the morning and 100 mg by mouth before bed.    [provider]  Melatonin 3 MG CAPS Take 1 capsule by mouth at bedtime.    [provider]  montelukast (SINGULAIR) 10 MG tablet Take 1 tablet (10 mg total) by mouth at bedtime. 09/27/18   MBrunetta Jeans PA-C   Naproxen Sodium (ALEVE) 220 MG CAPS As needed for headache 04/15/15   Penumalli, VEarlean Polka MD  NEOMYCIN-POLYMYXIN-HYDROCORTISONE (CORTISPORIN) 1 % SOLN OTIC solution Apply 1-2 drops to toe bid after soaking 04/03/18   Hyatt, Max T, DPM  traZODone (DESYREL) 50 MG tablet Take 50 mg by mouth at bedtime as needed for sleep.  05/29/15   [provider]  triamterene-hydrochlorothiazide (MAXZIDE-25) 37.5-25 MG tablet TAKE 1 TABLET BY MOUTH EVERY DAY 10/04/18   TMidge Minium MD   No Known Allergies  FAMILY HISTORY:  family history includes Arrhythmia in her brother; Coronary artery disease in her mother; Heart failure in her mother; Hypertension in her brother, father, and mother; Liver disease in her sister. SOCIAL HISTORY:  reports that she has never smoked. She has never used smokeless tobacco. She reports that she does not drink alcohol or use drugs.    Review of Systems:  Gen:  Denies  fever, sweats, chills weigh loss  HEENT: Denies blurred vision, double vision, ear pain, eye pain, hearing loss, nose bleeds, sore throat Cardiac:  No dizziness, chest pain or heaviness, chest tightness,edema, No JVD Resp:   No cough, -sputum production, -shortness of breath,-wheezing, -hemoptysis,  Gi: Denies swallowing difficulty, stomach pain, nausea or vomiting, diarrhea, constipation, bowel incontinence Gu:  Denies bladder incontinence, burning urine Ext:   Denies Joint pain, stiffness or swelling Skin: Denies  skin rash, easy bruising or bleeding or hives Endoc:  Denies polyuria, polydipsia , polyphagia or weight change Psych:   Denies depression, insomnia or hallucinations  Other:  All other systems negative   BP 120/62 (BP Location: Left Arm, Cuff Size: Normal)   Pulse 98   Temp (!) 97.3 F (36.3 C) (Skin)   Ht 5' 1"  (1.549 m)   Wt 114 lb 6.4 oz (51.9 kg)   SpO2 (!) 62%   BMI 21.62 kg/m      Physical Examination:   GENERAL:NAD, no fevers, chills, no weakness no fatigue HEAD:  Normocephalic, atraumatic.  EYES: PERLA, EOMI No scleral icterus.  NECK: Supple. No thyromegaly.  No JVD.  PULMONARY: CTA B/L no wheezing, rhonchi, crackles CARDIOVASCULAR: S1 and S2. Regular rate and rhythm. No murmurs GASTROINTESTINAL: Soft, nontender, nondistended. Positive bowel sounds.  MUSCULOSKELETAL: No swelling, clubbing, or edema.  NEUROLOGIC: No gross focal neurological deficits. 5/5 strength all extremities SKIN: No ulceration, lesions, rashes, or cyanosis.  PSYCHIATRIC: Insight, judgment intact. -depression -anxiety ALL OTHER ROS ARE NEGATIVE   MEDICATIONS: I have reviewed all medications and confirmed regimen as documented       IMAGING    CT images reviewed from July 2010 Right middle lobe opacification consistent with pneumonia There was no follow-up CT scans Patient will need further imaging with CT chest for further assessment    ASSESSMENT AND PLAN SYNOPSIS  63year old pleasant white female seen today for follow-up pneumonia and with the history of asthma in the setting of chronic right middle lobe opacification  At this time her acute pneumonia and asthma exacerbation have resolved and she no longer needs antibiotics or prednisone therapy at this time Her asthma  seems to be under control with Qvar I recommend continuing this daily   Abnormal CT chest with right middle lobe opacification 10 years ago Current chest x-ray with right middle lobe opacification Patient will need CT chest for further evaluation I have discussed with her the possibility of proceeding with a bronchoscopy once I obtain CT chest    COVID-19 EDUCATION: The signs and symptoms of COVID-19 were discussed with the patient and how to seek care for testing.  The importance of social distancing was discussed today. Hand Washing Techniques and avoid touching face was advised.  MEDICATION ADJUSTMENTS/LABS AND TESTS ORDERED: Continue inhalers as prescribed CT chest ordered    CURRENT MEDICATIONS REVIEWED AT LENGTH WITH PATIENT TODAY   Patient satisfied with Plan of action and management. All questions answered  Follow up in 3 onths   Avan Gullett Patricia Pesa, M.D.  [facility]  Medical Director [place] Director [facility] Cardio-Pulmonary Department

## 2018-10-25 NOTE — Patient Instructions (Signed)
Obtain CT chest assess for pneumonia  Continue Qvar as prescribed  Albuterol as needed  Avoid Allergens  COVID-19 EDUCATION: The signs and symptoms of COVID-19 were discussed with the patient and how to seek care for testing.  The importance of social distancing was discussed today. Hand Washing Techniques and avoid touching face was advised.

## 2018-10-27 ENCOUNTER — Ambulatory Visit (INDEPENDENT_AMBULATORY_CARE_PROVIDER_SITE_OTHER): Payer: Medicare Other | Admitting: Family Medicine

## 2018-10-27 ENCOUNTER — Other Ambulatory Visit: Payer: Self-pay

## 2018-10-27 ENCOUNTER — Encounter: Payer: Self-pay | Admitting: Family Medicine

## 2018-10-27 DIAGNOSIS — R918 Other nonspecific abnormal finding of lung field: Secondary | ICD-10-CM

## 2018-10-27 DIAGNOSIS — I639 Cerebral infarction, unspecified: Secondary | ICD-10-CM | POA: Diagnosis not present

## 2018-10-27 NOTE — Progress Notes (Signed)
I have discussed the procedure for the virtual visit with the patient who has given consent to proceed with assessment and treatment.   Pt unable to obtain vitals.   Jessica L Brodmerkel, CMA     

## 2018-10-27 NOTE — Progress Notes (Signed)
Virtual Visit via Video   I connected with patient on 10/27/18 at  4:15 PM EDT by a video enabled telemedicine application and verified that I am speaking with the correct person using two identifiers.  Location patient: Home Location provider: Acupuncturist, Office Persons participating in the virtual visit: Patient, Provider, [place] [name]  I discussed the limitations of evaluation and management by telemedicine and the availability of in person appointments. The patient expressed understanding and agreed to proceed.  Subjective:   HPI:   F/u from pulmonary- saw Dr Mortimer Fries on 7/29.  'the area that I had the PNA in 10 yrs ago was still there and that's where I had my most recent PNA'.  Dr Mortimer Fries wants to repeat CT scan w/ possible of bronchoscopy.  She wanted to know my thoughts on proceeding w/ this.  ROS:   See pertinent positives and negatives per HPI.  Patient Active Problem List   Diagnosis Date Noted  . Left sided numbness 10/22/2018  . CAP (community acquired pneumonia) 09/29/2018  . Nonallopathic lesion of thoracic region 09/05/2018  . Nonallopathic lesion of rib cage 09/05/2018  . Nonallopathic lesion of cervical region 09/05/2018  . Nonallopathic lesion of lumbosacral region 09/05/2018  . Nonallopathic lesion of sacral region 09/05/2018  . Skin cancer   . Osteopenia after menopause   . Chronic right shoulder pain 03/30/2017  . Right shoulder pain 01/12/2017  . Tendinopathy of rotator cuff 12/20/2016  . Angle-closure glaucoma, severe stage 08/19/2015  . Osteopenia 06/19/2015  . Migraine without aura and without status migrainosus, not intractable 01/13/2015  . High risk medications (not anticoagulants) long-term use 09/26/2014  . Breast signs and symptoms 02/08/2014  . Routine general medical examination at a health care facility 07/27/2012  . Hyperlipidemia 09/29/2010  . URETEROPELVIC JUNCTION OBSTRUCTION, CONGENITAL 12/15/2009  . CONSTIPATION 12/11/2009  .  CARDIAC MURMUR 11/26/2008  . MAGNETIC RESONANCE IMAGING, BRAIN, ABNORMAL 10/29/2008  . VENEREAL WART 10/23/2008  . Major depressive disorder, recurrent episode ([place]) 10/23/2008  . COMMON MIGRAINE 10/23/2008  . Essential hypertension 10/23/2008  . ALLERGIC RHINITIS 10/23/2008  . ASTHMA, INTERMITTENT, MILD 10/23/2008  . RENAL CALCULUS, HX OF 10/23/2008    Social History   Tobacco Use  . Smoking status: Never Smoker  . Smokeless tobacco: Never Used  Substance Use Topics  . Alcohol use: No    Alcohol/week: 0.0 standard drinks    Current Outpatient Medications:  .  atorvastatin (LIPITOR) 20 MG tablet, TAKE 1 TABLET BY MOUTH EVERY DAY, Disp: 30 tablet, Rfl: 6 .  beclomethasone (QVAR) 80 MCG/ACT inhaler, Inhale 1-2 puffs into the lungs 2 (two) times daily., Disp: 10.6 g, Rfl: 0 .  Calcium-Vitamin D-Vitamin K (CALCIUM + D + K PO), Take 3 each by mouth daily. Calcium 522m- Vitamin D 1000iu- K 471m, Disp: , Rfl:  .  clonazePAM (KLONOPIN) 0.5 MG tablet, Take 0.25 mg by mouth at bedtime as needed. , Disp: , Rfl: 2 .  ELDERBERRY PO, Take by mouth., Disp: , Rfl:  .  escitalopram (LEXAPRO) 10 MG tablet, Take 10 mg by mouth daily. , Disp: , Rfl:  .  gabapentin (NEURONTIN) 100 MG capsule, Take 1-3 capsules (100-300 mg total) by mouth 2 (two) times daily. (Patient taking differently: Take 100-300 mg by mouth 2 (two) times daily. 100 in the morning and 200 at night.), Disp: 180 capsule, Rfl: 12 .  Glucosamine-Chondroitin (MOVE FREE PO), Take 1 tablet by mouth daily., Disp: , Rfl:  .  Ketoconazole-Hydrocortisone  2 & 1 % KIT, Apply topically as needed. Cracks on mouth, Disp: , Rfl:  .  lamoTRIgine (LAMICTAL) 100 MG tablet, TAKE 152m in the morning and 100 mg by mouth before bed., Disp: , Rfl:  .  Melatonin 3 MG CAPS, Take 1 capsule by mouth at bedtime., Disp: , Rfl:  .  montelukast (SINGULAIR) 10 MG tablet, Take 1 tablet (10 mg total) by mouth at bedtime., Disp: 30 tablet, Rfl: 3 .  Naproxen Sodium  (ALEVE) 220 MG CAPS, As needed for headache, Disp: 60 each, Rfl:  .  traZODone (DESYREL) 50 MG tablet, Take 50 mg by mouth at bedtime as needed for sleep. , Disp: , Rfl:  .  triamterene-hydrochlorothiazide (MAXZIDE-25) 37.5-25 MG tablet, TAKE 1 TABLET BY MOUTH EVERY DAY, Disp: 30 tablet, Rfl: 6  No Known Allergies  Objective:   There were no vitals taken for this visit.  AAOx3, NAD NCAT, EOMI No obvious CN deficits Coloring WNL Pt is able to speak clearly, coherently without shortness of breath or increased work of breathing.  Thought process is linear.  Mood is appropriate.   Assessment and Plan:   Abnormal lung imaging- told her I read Dr KZoila Shutternote and everything he recommended seemed absolutely appropriate and I would proceed as he suggested.  This made her feel better and she agreed that this seemed sensible.  Will follow along to help explain things if needed.   KAnnye Asa MD 10/27/2018

## 2018-10-31 ENCOUNTER — Ambulatory Visit
Admission: RE | Admit: 2018-10-31 | Discharge: 2018-10-31 | Disposition: A | Discharge status: Home / Self Care | Payer: Medicare Other | Source: Ambulatory Visit | Attending: Internal Medicine | Admitting: Internal Medicine

## 2018-10-31 ENCOUNTER — Other Ambulatory Visit: Payer: Self-pay

## 2018-10-31 DIAGNOSIS — R9389 Abnormal findings on diagnostic imaging of other specified body structures: Secondary | ICD-10-CM | POA: Diagnosis not present

## 2018-10-31 DIAGNOSIS — R918 Other nonspecific abnormal finding of lung field: Secondary | ICD-10-CM | POA: Diagnosis not present

## 2018-10-31 DIAGNOSIS — J189 Pneumonia, unspecified organism: Secondary | ICD-10-CM | POA: Diagnosis not present

## 2018-11-01 ENCOUNTER — Ambulatory Visit: Payer: Medicare Other | Admitting: Diagnostic Neuroimaging

## 2018-11-06 ENCOUNTER — Telehealth: Payer: Self-pay | Admitting: Internal Medicine

## 2018-11-06 NOTE — Telephone Encounter (Signed)
Called and spoke to pt, who is requesting CT results from 10/31/2018. Pt reviewed results via mychart, however she does not know what they mean.   DK please advise. Thanks

## 2018-11-08 DIAGNOSIS — F3181 Bipolar II disorder: Secondary | ICD-10-CM | POA: Diagnosis not present

## 2018-11-08 DIAGNOSIS — F411 Generalized anxiety disorder: Secondary | ICD-10-CM | POA: Diagnosis not present

## 2018-11-08 NOTE — Telephone Encounter (Signed)
Pt calling in to see when someone will be calling her back to discuss the results of the CT scan. She does not understand them and would like some help interpreting them Pleae advise.   them.

## 2018-11-08 NOTE — Telephone Encounter (Signed)
Will close encounter as nothing further is needed.  

## 2018-11-08 NOTE — Telephone Encounter (Signed)
Left message for pt

## 2018-11-08 NOTE — Telephone Encounter (Signed)
Called and let patient know about CT scan results

## 2018-11-09 ENCOUNTER — Other Ambulatory Visit: Payer: Self-pay | Admitting: Family Medicine

## 2018-11-16 ENCOUNTER — Telehealth: Payer: Self-pay | Admitting: Internal Medicine

## 2018-11-16 NOTE — Telephone Encounter (Signed)
Pt ended up not needed to send message to triage. Nothing further

## 2018-12-08 ENCOUNTER — Ambulatory Visit: Payer: Medicare Other | Admitting: Internal Medicine

## 2018-12-11 ENCOUNTER — Encounter: Payer: Self-pay | Admitting: Family Medicine

## 2018-12-11 ENCOUNTER — Ambulatory Visit (INDEPENDENT_AMBULATORY_CARE_PROVIDER_SITE_OTHER): Payer: Medicare Other | Admitting: Family Medicine

## 2018-12-11 ENCOUNTER — Other Ambulatory Visit: Payer: Self-pay

## 2018-12-11 DIAGNOSIS — I639 Cerebral infarction, unspecified: Secondary | ICD-10-CM | POA: Diagnosis not present

## 2018-12-11 DIAGNOSIS — R9389 Abnormal findings on diagnostic imaging of other specified body structures: Secondary | ICD-10-CM | POA: Diagnosis not present

## 2018-12-11 NOTE — Progress Notes (Signed)
Virtual Visit via Video   I connected with patient on 12/11/18 at  1:30 PM EDT by a video enabled telemedicine application and verified that I am speaking with the correct person using two identifiers.  Location patient: Home Location provider: [facility], Office Persons participating in the virtual visit: Patient, Provider, [place] [name]  I discussed the limitations of evaluation and management by telemedicine and the availability of in person appointments. The patient expressed understanding and agreed to proceed.  Subjective:   HPI:   CT scan- pt had repeat CT on 8/4 and there were no acute findings (CT report viewed and pulmonary notes).  Pulmonary does not feel the need to proceed w/ bronchoscopy.  She has a f/u scheduled next month.  No cough, SOB.  R sided pleuritic CP has improved.  She finds the addition of singulair has been quite helpful and reduced her need for the inhaler.  She is asking about getting the Prevnar vaccine.  ROS:   See pertinent positives and negatives per HPI.  Patient Active Problem List   Diagnosis Date Noted  . Left sided numbness 10/22/2018  . CAP (community acquired pneumonia) 09/29/2018  . Nonallopathic lesion of thoracic region 09/05/2018  . Nonallopathic lesion of rib cage 09/05/2018  . Nonallopathic lesion of cervical region 09/05/2018  . Nonallopathic lesion of lumbosacral region 09/05/2018  . Nonallopathic lesion of sacral region 09/05/2018  . Skin cancer   . Osteopenia after menopause   . Chronic right shoulder pain 03/30/2017  . Right shoulder pain 01/12/2017  . Tendinopathy of rotator cuff 12/20/2016  . Angle-closure glaucoma, severe stage 08/19/2015  . Osteopenia 06/19/2015  . Migraine without aura and without status migrainosus, not intractable 01/13/2015  . High risk medications (not anticoagulants) long-term use 09/26/2014  . Breast signs and symptoms 02/08/2014  . Routine general medical examination at a health care  facility 07/27/2012  . Hyperlipidemia 09/29/2010  . URETEROPELVIC JUNCTION OBSTRUCTION, CONGENITAL 12/15/2009  . CONSTIPATION 12/11/2009  . CARDIAC MURMUR 11/26/2008  . MAGNETIC RESONANCE IMAGING, BRAIN, ABNORMAL 10/29/2008  . VENEREAL WART 10/23/2008  . Major depressive disorder, recurrent episode ([place]) 10/23/2008  . COMMON MIGRAINE 10/23/2008  . Essential hypertension 10/23/2008  . ALLERGIC RHINITIS 10/23/2008  . ASTHMA, INTERMITTENT, MILD 10/23/2008  . RENAL CALCULUS, HX OF 10/23/2008    Social History   Tobacco Use  . Smoking status: Never Smoker  . Smokeless tobacco: Never Used  Substance Use Topics  . Alcohol use: No    Alcohol/week: 0.0 standard drinks    Current Outpatient Medications:  .  atorvastatin (LIPITOR) 20 MG tablet, TAKE 1 TABLET BY MOUTH EVERY DAY, Disp: 30 tablet, Rfl: 6 .  beclomethasone (QVAR) 80 MCG/ACT inhaler, Inhale 1-2 puffs into the lungs 2 (two) times daily., Disp: 10.6 g, Rfl: 0 .  Calcium-Vitamin D-Vitamin K (CALCIUM + D + K PO), Take 3 each by mouth daily. Calcium 536m- Vitamin D 1000iu- K 439m, Disp: , Rfl:  .  clonazePAM (KLONOPIN) 0.5 MG tablet, Take 0.25 mg by mouth at bedtime as needed. , Disp: , Rfl: 2 .  ELDERBERRY PO, Take by mouth., Disp: , Rfl:  .  escitalopram (LEXAPRO) 10 MG tablet, Take 10 mg by mouth daily. , Disp: , Rfl:  .  gabapentin (NEURONTIN) 100 MG capsule, Take 1-3 capsules (100-300 mg total) by mouth 2 (two) times daily. (Patient taking differently: Take 100-300 mg by mouth 2 (two) times daily. 100 in the morning and 200 at night.), Disp: 180  capsule, Rfl: 12 .  Glucosamine-Chondroitin (MOVE FREE PO), Take 1 tablet by mouth daily., Disp: , Rfl:  .  Ketoconazole-Hydrocortisone 2 & 1 % KIT, Apply topically as needed. Cracks on mouth, Disp: , Rfl:  .  lamoTRIgine (LAMICTAL) 100 MG tablet, TAKE 147m in the morning and 100 mg by mouth before bed., Disp: , Rfl:  .  Melatonin 3 MG CAPS, Take 1 capsule by mouth at bedtime., Disp: ,  Rfl:  .  montelukast (SINGULAIR) 10 MG tablet, Take 1 tablet (10 mg total) by mouth at bedtime., Disp: 30 tablet, Rfl: 3 .  Naproxen Sodium (ALEVE) 220 MG CAPS, As needed for headache, Disp: 60 each, Rfl:  .  traZODone (DESYREL) 50 MG tablet, Take 50 mg by mouth at bedtime as needed for sleep. , Disp: , Rfl:  .  triamterene-hydrochlorothiazide (MAXZIDE-25) 37.5-25 MG tablet, TAKE 1 TABLET BY MOUTH EVERY DAY, Disp: 30 tablet, Rfl: 6  No Known Allergies  Objective:   Ht 5' 1"  (1.549 m)   Wt 112 lb (50.8 kg)   BMI 21.16 kg/m   AAOx3, NAD NCAT, EOMI No obvious CN deficits Coloring WNL Pt is able to speak clearly, coherently without shortness of breath or increased work of breathing.  Thought process is linear.  Mood is appropriate.   Assessment and Plan:   Abnormal lung CT- pt had her f/u CT and pulmonary decided not to proceed w/ bronchoscopy.  This is great news.  She has f/u w/ them next month.  She will ask Medicare if she is eligible for Prevnar due to her intermittent asthma.  She was offered a flu shot but wants to wait until October.  Will follow.   KAnnye Asa MD 12/11/2018

## 2018-12-11 NOTE — Progress Notes (Signed)
I have discussed the procedure for the virtual visit with the patient who has given consent to proceed with assessment and treatment.   Jessica L Brodmerkel, CMA     

## 2018-12-12 ENCOUNTER — Telehealth: Payer: Self-pay | Admitting: Internal Medicine

## 2018-12-12 NOTE — Telephone Encounter (Signed)
Spoke with pt. Advised that she would need to schedule appt. With Dr.Kasa to get the vaccination. Explained that that which pneumonia vaccination she receives would be determined by age, and hx. Pt really does not want to come fore ov and states she may just go to pharmacy. Pt will call back if she decides to come here.

## 2018-12-12 NOTE — Telephone Encounter (Signed)
ATC No answer and no voicemail. Will try again later.

## 2018-12-13 ENCOUNTER — Telehealth: Payer: Self-pay | Admitting: Internal Medicine

## 2018-12-13 NOTE — Telephone Encounter (Signed)
Pt is aware of below message and voiced her understanding. Nothing further is needed. 

## 2018-12-13 NOTE — Telephone Encounter (Signed)
Pneumococcal 23 and Pneumococcal 13. However pt would like to have vaccine at her local pharmacy.

## 2018-12-13 NOTE — Telephone Encounter (Signed)
DK, which pneumonia vaccine do you recommend for pt?  Per our records it does not appear that she has had a PNA vaccine previously.  Okay to okay?

## 2018-12-13 NOTE — Telephone Encounter (Signed)
Which one would you like to order for her?

## 2018-12-13 NOTE — Telephone Encounter (Signed)
What do we have in office

## 2018-12-13 NOTE — Telephone Encounter (Signed)
ok 

## 2018-12-13 NOTE — Telephone Encounter (Signed)
Left message for pt

## 2018-12-13 NOTE — Telephone Encounter (Signed)
23

## 2018-12-18 ENCOUNTER — Other Ambulatory Visit: Payer: Self-pay | Admitting: Certified Nurse Midwife

## 2018-12-18 ENCOUNTER — Other Ambulatory Visit: Payer: Self-pay | Admitting: Family Medicine

## 2018-12-18 ENCOUNTER — Telehealth: Payer: Self-pay

## 2018-12-18 DIAGNOSIS — Z1231 Encounter for screening mammogram for malignant neoplasm of breast: Secondary | ICD-10-CM

## 2018-12-18 NOTE — Telephone Encounter (Signed)
Called pt to screen and confirm appt.  Pt wished to cancel appt and have PNA vaccine at pharmacy due to co pay.  Nothing further is needed at this time.

## 2018-12-20 ENCOUNTER — Ambulatory Visit: Payer: Medicare Other | Admitting: Internal Medicine

## 2019-01-03 DIAGNOSIS — F3181 Bipolar II disorder: Secondary | ICD-10-CM | POA: Diagnosis not present

## 2019-01-03 DIAGNOSIS — F411 Generalized anxiety disorder: Secondary | ICD-10-CM | POA: Diagnosis not present

## 2019-01-08 ENCOUNTER — Telehealth: Payer: Self-pay

## 2019-01-08 NOTE — Telephone Encounter (Signed)
Spoke with patient. Requests earlier appointment. Let patient know that we can call her if there are any cancellations. Patient voices understanding.

## 2019-01-10 ENCOUNTER — Other Ambulatory Visit: Payer: Self-pay

## 2019-01-10 DIAGNOSIS — Z20822 Contact with and (suspected) exposure to covid-19: Secondary | ICD-10-CM

## 2019-01-11 ENCOUNTER — Ambulatory Visit (INDEPENDENT_AMBULATORY_CARE_PROVIDER_SITE_OTHER): Payer: Medicare Other | Admitting: Internal Medicine

## 2019-01-11 ENCOUNTER — Ambulatory Visit: Payer: Medicare Other | Admitting: Internal Medicine

## 2019-01-11 ENCOUNTER — Encounter: Payer: Self-pay | Admitting: Internal Medicine

## 2019-01-11 ENCOUNTER — Ambulatory Visit: Payer: Medicare Other | Admitting: Family Medicine

## 2019-01-11 DIAGNOSIS — J4520 Mild intermittent asthma, uncomplicated: Secondary | ICD-10-CM

## 2019-01-11 LAB — NOVEL CORONAVIRUS, NAA: SARS-CoV-2, NAA: NOT DETECTED

## 2019-01-11 MED ORDER — BECLOMETHASONE DIPROP HFA 80 MCG/ACT IN AERB: 1.0000 | INHALATION_SPRAY | Freq: Two times a day (BID) | RESPIRATORY_TRACT | 10 refills | Status: DC

## 2019-01-11 NOTE — Patient Instructions (Signed)
Recommend Qvar as scheduled and use albuterol as needed

## 2019-01-11 NOTE — Progress Notes (Signed)
Name: Theresa Martin MRN: [id number] DOB: 07-05-1955     I connected with the patient by telephone enabled telemedicine visit and verified that I am speaking with the correct person using two identifiers.    I discussed the limitations, risks, security and privacy concerns of performing an evaluation and management service by telemedicine and the availability of in-person appointments. I also discussed with the patient that there may be a patient responsible charge related to this service. The patient expressed understanding and agreed to proceed.  PATIENT AGREES AND CONFIRMS -YES   Other persons participating in the visit and their role in the encounter: Patient, nursing  This visit type was conducted due to national recommendations for restrictions regarding the COVID-19 Pandemic (e.g. social distancing).  This format is felt to be most appropriate for this patient at this time.  All issues noted in this document were discussed and addressed.        REFERRING MD : Birdie Riddle  CHIEF COMPLAINT: SOB  HISTORY OF PRESENT ILLNESS: Increased use on inhalers Increased WOB RT sided chest pain  Patient is very anxious lady-this is driving her symptoms She is NOT taking inhalers   Using albuterol as needed   Triggers include smoke and perfumes and colognes  No exacerbation at this time No evidence of heart failure at this time No evidence or signs of infection at this time No respiratory distress No fevers, chills, nausea, vomiting, diarrhea No evidence of lower extremity edema No evidence hemoptysis       PAST MEDICAL HISTORY :   has a past medical history of Allergy, Anxiety, Depression, Hyperlipidemia, Hypertension, Osteopenia after menopause, Persistent headaches, Pneumonia, Postherpetic neuralgia, Renal disorder, Shingles outbreak (02/2014), and Skin cancer.  has a past surgical history that includes Cesarean section (1987, 1989); Exploratory laparotomy; Cardiovascular  stress test (02/2009); Mole removal (2017); Eye surgery (Bilateral); and Colonoscopy. Prior to Admission medications   Medication Sig Start Date End Date Taking? Authorizing Provider  albuterol (VENTOLIN HFA) 108 (90 Base) MCG/ACT inhaler Inhale 2 puffs into the lungs every 6 (six) hours as needed for wheezing or shortness of breath. 08/10/18   [name], MD  atorvastatin (LIPITOR) 20 MG tablet TAKE 1 TABLET BY MOUTH EVERY DAY 04/10/18   [name], MD  beclomethasone (QVAR) 80 MCG/ACT inhaler Inhale 1-2 puffs into the lungs 2 (two) times daily. 09/27/18   Brunetta Jeans, PA-C  Calcium-Vitamin D-Vitamin K (CALCIUM + D + K PO) Take 3 each by mouth daily. Calcium 542m- Vitamin D 1000iu- K 441m    [provider]  clonazePAM (KLONOPIN) 0.5 MG tablet Take 0.25 mg by mouth at bedtime as needed.  01/12/14   [provider]  ELDERBERRY PO Take by mouth.    [provider]  escitalopram (LEXAPRO) 10 MG tablet Take 10 mg by mouth daily.     [provider]  gabapentin (NEURONTIN) 100 MG capsule Take 1-3 capsules (100-300 mg total) by mouth 2 (two) times daily. Patient taking differently: Take 100-300 mg by mouth 2 (two) times daily. 100 in the morning and 200 at night. 01/30/18   Penumalli, ViEarlean PolkaMD  Glucosamine-Chondroitin (MOVE FREE PO) Take 1 tablet by mouth daily.    [provider]  Ketoconazole-Hydrocortisone 2 & 1 % KIT Apply topically as needed. Cracks on mouth    [provider]  lamoTRIgine (LAMICTAL) 100 MG tablet TAKE 10011mn the morning and 100 mg by mouth before bed.  [provider]  Melatonin 3 MG CAPS Take 1 capsule by mouth at bedtime.    [provider]  montelukast (SINGULAIR) 10 MG tablet Take 1 tablet (10 mg total) by mouth at bedtime. 09/27/18   Brunetta Jeans, PA-C  Naproxen Sodium (ALEVE) 220 MG CAPS As needed for headache 04/15/15   Penumalli, Earlean Polka, MD   NEOMYCIN-POLYMYXIN-HYDROCORTISONE (CORTISPORIN) 1 % SOLN OTIC solution Apply 1-2 drops to toe bid after soaking 04/03/18   Hyatt, Max T, DPM  traZODone (DESYREL) 50 MG tablet Take 50 mg by mouth at bedtime as needed for sleep.  05/29/15   [provider]  triamterene-hydrochlorothiazide (MAXZIDE-25) 37.5-25 MG tablet TAKE 1 TABLET BY MOUTH EVERY DAY 10/04/18   [name], MD   No Known Allergies  FAMILY HISTORY:  family history includes Arrhythmia in her brother; Coronary artery disease in her mother; Heart failure in her mother; Hypertension in her brother, father, and mother; Liver disease in her sister. SOCIAL HISTORY:  reports that she has never smoked. She has never used smokeless tobacco. She reports that she does not drink alcohol or use drugs.    Review of Systems:  Gen:  Denies  fever, sweats, chills weigh loss  HEENT: Denies blurred vision, double vision, ear pain, eye pain, hearing loss, nose bleeds, sore throat Cardiac:  No dizziness, chest pain or heaviness, chest tightness,edema, No JVD Resp:   No cough, -sputum production, -shortness of breath,-wheezing, -hemoptysis,  Gi: Denies swallowing difficulty, stomach pain, nausea or vomiting, diarrhea, constipation, bowel incontinence Gu:  Denies bladder incontinence, burning urine Ext:   Denies Joint pain, stiffness or swelling Skin: Denies  skin rash, easy bruising or bleeding or hives Endoc:  Denies polyuria, polydipsia , polyphagia or weight change Psych:   Denies depression, insomnia or hallucinations  Other:  All other systems negative   There were no vitals taken for this visit.        IMAGING   CT chest 10/2018  No acute process, 3 mm nodules No further CT scans needed   CT  was Independently Reviewed By Me Today       ASSESSMENT AND PLAN SYNOPSIS  No signs of ASTHMA exacerbation Patient needs to take inhalers daily instead of as needed  Patient needs to be assessed by Psychiatry  Patient to follow up    No further CT scans needed    COVID-19 EDUCATION: The signs and symptoms of COVID-19 were discussed with the patient and how to seek care for testing.  The importance of social distancing was discussed today. Hand Washing Techniques and avoid touching face was advised.  MEDICATION ADJUSTMENTS/LABS AND TESTS ORDERED: Continue inhalers as prescribed    CURRENT MEDICATIONS REVIEWED AT LENGTH WITH PATIENT TODAY   Patient satisfied with Plan of action and management. All questions answered  Follow up in 6 months   Total Time spent 25 mins  Hildreth Orsak Patricia Pesa, M.D.  [facility]  Medical Director [place] Director [facility] Cardio-Pulmonary Department

## 2019-01-15 NOTE — Progress Notes (Signed)
PATIENT: Theresa Martin DOB: 1955/05/02  REASON FOR VISIT: follow up HISTORY FROM: patient  Chief Complaint  Patient presents with   Migraine    rm 7, 3 month FU, "headaches are worse, hospital stopped propranolol so wonder if that has caused this"     HISTORY OF PRESENT ILLNESS: Today 01/16/19 Theresa Martin is a 63 y.o. female here today for follow up. She has noted an increase in frequency and intensity of headaches since stopping propranolol in July. She was seen by ER for concerns of PNA and transient numbness of left side and bilateral feet. She had resolution of numbness but reports that her legs will tremble at times. During admission, heart rate was low and propranolol was discontinued. Around August, headaches started worsening. Headaches are typically left sided. It starts in her neck and radiates to the top of her head. It can be sharp or dull. Pain is worse when she is moving and better at rest. She has tried Aleve, ibuprofen, meloxicam (uncertain of dose, given as needed two years ago) and Tylenol. She has taken all of these multiple times daily for the past two weeks. She has also taken clonazepam (old prescription), diazepam (old prescription) or alprazolam (from friend). Last week she contacted her psychiatrist and was given a prescription for clonazepam 0.28m twice daily. She has taken this dose for three days and reports that headaches are better but continue. She continues lamotrigine 1071mtwice daily and escitalopram 1038maily for mood. She is taking gabapentin 100m79m am and 200mg36mpm. She has not tried increasing dose. She is tolerating current dose. BP is 176/75 today but she reports that at home readings are usually 130's/70's. HR is 54 today, 63 when last checked at home. She denies concerns of acute confusion, speech changes, unilateral weakness or numbness, or gait changes. She has never taken triptan medications that she can remember.    HISTORY:  (copied from my note on 10/19/2018)  Theresa Martin is a 62 y.23 female here today for follow up migraines. [facility]. She had a sudden onset of left sided numbness. She states that her left side and bilateral feet were numb. She denies weakness. No changes in speech, gait or mental status. She also had cough and felt very fatigued. She was diagnosed with right sided PNA and treated with multiple abx. NA was 127 on admission and BP was 196/92. Head CT and MRI were negative for stroke. Numbness resolved completely within 12 hours. While hospitalized, she reports bradycardia. She was discharged home and advised to discontinue propranolol for migraine prevention. She has felt much better since being home. She denies headaches. She denies any new numbness or weakness. She has been seen by PCP who referred her for pulmonary evaluation due to recurrent PNA.   MRI did show concerns for demyelination versus small vessel disease. Previous MS workup with Dr PenumLeta Baptistnegative. She does have long standing history of HTN and HLD. She was started on 81mg 60mrin daily.    HISTORY: (copied from Dr PenumaGladstone Lighteron 01/30/2018)  UPDATE (01/30/18, VRP): Since last visit, doingworse with HA (14-15 per month).More stress levels than last visit. Burning sensation in scalp continues.  UPDATE (08/24/17, VRP): Since last visit, doing well. Tolerating propranolol. Avg 4-13 migraine per month. No alleviating or aggravating factors.   Separately, had right thoracic burning pain (no rash) in fall 2018, and was tx'd empirically with acyclovir.  Then with right scalp burning pain last week; also with chronic left scalp sensitivity with migraines.  UPDATE 08/24/16: Since last visit, avg 2-3 HA per month. Tolerating meds. No new issues. Mood stable. Weight improved.   UPDATE 02/23/16: Since last visit has ~ 1-5 HA per month. Tolerating propranolol + OTC tylenol/aleve for HA  mgmt.   UPDATE 08/19/15: Since last visit, avg 1-10 HA per month. Usually with stress. Some more wt gain noted (~10lbs). Also had a a laser eye procedure for angle closure glaucoma, now improved.  UPDATE 04/15/15: Since last visit, doing well. No more migraine since 02/28/15. Propranolol is helping prevent HA. Mood stable.  UPDATE 01/13/15: Since last visit, having more headaches, and more fluid retention. Now being tapered off gabapentin by psychiatry due to side effects.   UPDATE 09/02/14: Doing well. Avg 3-6 days HA per month. Some HA are mild, some are severe. Taking gabapentin 216m TID. Works out every day at [employer] Overall mood is better as well.  UPDATE 05/17/14: Since last visit, patient had some psychiatry issues, was managed by behavioral health, and her headaches significant improved. She was doing well for several years and did not follow-up in our clinic. Patient here with her father today for this visit. Patient's father notes that her headaches seem to be significantly associated with her stress levels. In the last few months her stress levels have increased significantly. Her stress levels related to her ex-husband and her daughters, and some family issues. Patient having intermittent, almost daily left-sided headaches, with numbness and tingling in the left scalp, typically in the evening when she is home. During the daytime patient stays active, works out several times a week, and does better. Patient had been on gabapentin 100 mg at bedtime for several years. Her psychiatrist increased this to 2 and then 3 capsules at bedtime. When she did 3 capsules at bedtime, her headache type symptoms paradoxical he worsened. She then went back to taking one capsule at bedtime. Strangely, she reports that she was told she was able to take this medication (gabapentin) as needed as well and for the last few weeks has been taking one capsule at bedtime, followed by 1-2 capsules every hour,  throughout the night, taking up to 10-14 capsules in a 12 hour period.   PRIOR HPI (11/04/08 - 06/03/09, VRP): [age]year old right-handed female with history of high blood pressure, depression and anxiety presenting for evaluation of chronic headaches and abnormal MRI scan. On October 26, 2008 the patient presented to the emergency room for right-sided chest pain and was diagnosed with pneumonia. At this time her headaches worsened in severity. Upon review of prior MRIs demonstrating microvascular gliosis, patient was referred to our neurology clinic for futher evaluation. Patient's headaches began in 2005 consisting of a "dull pressure like sensation" over the top of her head. Occasionally these are associated with mild nausea without vomiting as well as intermittent left facial numbness. Patient had dull nagging low level headaches on a daily basis with daily flareups involving a hot sensation over her scalp. Sometimes this sensation starts as posterior neck pain that moves up the back of her head. She was initially evaluated with MRI scans in 2005 and 2006 and started on topiramate 100 mg at bedtime. In addition she was taking Advil and Tylenol over-the-counter almost a daily basis. During flareups the patient would have to lay down in place ice packs over her eyes and the top of her head. Patient's headaches are aggravated by  lack of sleep or stress. No food triggers noted. Her other physicians decided to taper her off of NSAIDs, tylenol and also her topiramate. Patient had MRI of the head and neck, found to have some nonspecific white matter lesions. Patient had lumbar puncture and additional blood testing, but multiple sclerosis was ruled out. Patient was treated with several occipital nerve blocks with good results.   REVIEW OF SYSTEMS: Out of a complete 14 system review of symptoms, the patient complains only of the following symptoms, memory loss, dizziness, headaches, numbness, confusion,  anxiety/nervousness, restless legs, insomnia and all other reviewed systems are negative.   ALLERGIES: No Known Allergies  HOME MEDICATIONS: Outpatient Medications Prior to Visit  Medication Sig Dispense Refill   albuterol (PROVENTIL HFA) 108 (90 Base) MCG/ACT inhaler Inhale into the lungs every 6 (six) hours as needed for wheezing or shortness of breath.     aspirin EC 81 MG tablet Take 81 mg by mouth daily.     atorvastatin (LIPITOR) 20 MG tablet TAKE 1 TABLET BY MOUTH EVERY DAY 30 tablet 6   beclomethasone (QVAR) 80 MCG/ACT inhaler Inhale 1-2 puffs into the lungs 2 (two) times daily. 10.6 g 10   Calcium-Vitamin D-Vitamin K (CALCIUM + D + K PO) Take 3 each by mouth daily. Calcium 515m- Vitamin D 1000iu- K 438m     clonazePAM (KLONOPIN) 0.5 MG tablet Take 0.25 mg by mouth at bedtime as needed.   2   ELDERBERRY PO Take by mouth.     escitalopram (LEXAPRO) 10 MG tablet Take 10 mg by mouth daily.      gabapentin (NEURONTIN) 100 MG capsule Take 1-3 capsules (100-300 mg total) by mouth 2 (two) times daily. (Patient taking differently: Take 100-300 mg by mouth 2 (two) times daily. 100 in the morning and 200 at night.) 180 capsule 12   Glucosamine-Chondroitin (MOVE FREE PO) Take 1 tablet by mouth daily.     Ketoconazole-Hydrocortisone 2 & 1 % KIT Apply topically as needed. Cracks on mouth     lamoTRIgine (LAMICTAL) 100 MG tablet TAKE 10072mn the morning and 100 mg by mouth before bed.     Melatonin 3 MG CAPS Take 1 capsule by mouth at bedtime.     montelukast (SINGULAIR) 10 MG tablet Take 1 tablet (10 mg total) by mouth at bedtime. 30 tablet 3   Naproxen Sodium (ALEVE) 220 MG CAPS As needed for headache 60 each    traZODone (DESYREL) 50 MG tablet Take 50 mg by mouth at bedtime as needed for sleep.      triamterene-hydrochlorothiazide (MAXZIDE-25) 37.5-25 MG tablet TAKE 1 TABLET BY MOUTH EVERY DAY 30 tablet 6   No facility-administered medications prior to visit.     PAST  MEDICAL HISTORY: Past Medical History:  Diagnosis Date   Allergy    Anxiety    Depression    Hyperlipidemia    Hypertension    Osteopenia after menopause    Persistent headaches    Pneumonia    Postherpetic neuralgia    Renal disorder    Shingles outbreak 02/2014   Skin cancer    moles on right groin and right buttock.    PAST SURGICAL HISTORY: Past Surgical History:  Procedure Laterality Date   CARDIOVASCULAR STRESS TEST  02/2009   treadmill stress test: Low risk   CESAREAN SECTION  1987, 1989   COLONOSCOPY     2014/2015 [place], normal   EXPLORATORY LAPAROTOMY     laproscopy for infertility  EYE SURGERY Bilateral    laser-correct opening b/tn cornea and iris   MOLE REMOVAL  2017   x 2 moles    FAMILY HISTORY: Family History  Problem Relation Age of Onset   Hypertension Mother    Coronary artery disease Mother    Heart failure Mother    Hypertension Father    Liver disease Sister        liver failure   Arrhythmia Brother    Hypertension Brother    Cancer Neg Hx        colon    Breast cancer Neg Hx     SOCIAL HISTORY: Social History   Socioeconomic History   Marital status: Legally Separated    Spouse name: Not on file   Number of children: 2   Years of education: Not on file   Highest education level: Not on file  Occupational History   Occupation: Control and instrumentation engineer  Social Needs   Financial resource strain: Not on file   Food insecurity    Worry: Not on file    Inability: Not on file   Transportation needs    Medical: Not on file    Non-medical: Not on file  Tobacco Use   Smoking status: Never Smoker   Smokeless tobacco: Never Used  Substance and Sexual Activity   Alcohol use: No    Alcohol/week: 0.0 standard drinks   Drug use: No   Sexual activity: Not on file  Lifestyle   Physical activity    Days per week: Not on file    Minutes per session: Not on file   Stress: Not on file    Relationships   Social connections    Talks on phone: Not on file    Gets together: Not on file    Attends religious service: Not on file    Active member of club or organization: Not on file    Attends meetings of clubs or organizations: Not on file    Relationship status: Not on file   Intimate partner violence    Fear of current or ex partner: Not on file    Emotionally abused: Not on file    Physically abused: Not on file    Forced sexual activity: Not on file  Other Topics Concern   Not on file  Social History Narrative   Regular exercise- yes, spins daily   Diet: fruits and veggies, water    No caffeine      PHYSICAL EXAM  Vitals:   01/16/19 1357  BP: (!) 176/75  Pulse: (!) 54  Temp: (!) 96.9 F (36.1 C)  Weight: 112 lb 3.2 oz (50.9 kg)  Height: _0  (1.549 m)   Body mass index is 21.2 kg/m.  Generalized: Well developed, in no acute distress  Cardiology: normal rate and rhythm, no murmur noted Neurological examination  Mentation: Alert oriented to time, place, history taking. Follows all commands speech and language fluent Cranial nerve II-XII: Pupils were equal round reactive to light. Extraocular movements were full, visual field were full on confrontational test. Facial sensation and strength were normal. Uvula tongue midline. Head turning and shoulder shrug  were normal and symmetric. Motor: The motor testing reveals 5 over 5 strength of all 4 extremities. Good symmetric motor tone is noted throughout.  Sensory: Sensory testing is intact to soft touch on all 4 extremities. No evidence of extinction is noted.  Coordination: Cerebellar testing reveals good finger-nose-finger and heel-to-shin bilaterally.  Gait and station: Gait is  normal. Tandem gait is normal. Romberg is negative. No drift is seen.  Reflexes: Deep tendon reflexes are symmetric and normal bilaterally.   DIAGNOSTIC DATA (LABS, IMAGING, TESTING) - I reviewed patient records, labs, notes,  testing and imaging myself where available.  MMSE - Mini Mental State Exam 06/03/2016  Orientation to time 5  Orientation to Place 5  Registration 3  Attention/ Calculation 5  Recall 3  Language- name 2 objects 2  Language- repeat 1  Language- follow 3 step command 3  Language- read & follow direction 1  Write a sentence 1  Copy design 1  Total score 30     Lab Results  Component Value Date   WBC 6.1 10/12/2018   HGB 12.9 10/12/2018   HCT 38.5 10/12/2018   MCV 94.5 10/12/2018   PLT 279.0 10/12/2018      Component Value Date/Time   NA 140 10/12/2018 0943   K 3.9 10/12/2018 0943   CL 101 10/12/2018 0943   CO2 34 (H) 10/12/2018 0943   GLUCOSE 118 (H) 10/12/2018 0943   BUN 12 10/12/2018 0943   CREATININE 0.76 10/12/2018 0943   CREATININE 0.90 06/08/2017 1534   CALCIUM 9.0 10/12/2018 0943   PROT 7.0 09/30/2018 0549   ALBUMIN 3.6 09/30/2018 0549   AST 38 09/30/2018 0549   ALT 25 09/30/2018 0549   ALKPHOS 63 09/30/2018 0549   BILITOT 1.1 09/30/2018 0549   GFRNONAA >60 10/01/2018 0545   GFRAA >60 10/01/2018 0545   Lab Results  Component Value Date   CHOL 159 09/30/2018   HDL 65 09/30/2018   LDLCALC 87 09/30/2018   TRIG 34 09/30/2018   CHOLHDL 2.4 09/30/2018   Lab Results  Component Value Date   HGBA1C 5.3 09/30/2018   Lab Results  Component Value Date   ZOXWRUEA54 098 01/16/2010   Lab Results  Component Value Date   TSH 2.02 09/01/2018    ASSESSMENT AND PLAN 63 y.o. year old female  has a past medical history of Allergy, Anxiety, Depression, Hyperlipidemia, Hypertension, Osteopenia after menopause, Persistent headaches, Pneumonia, Postherpetic neuralgia, Renal disorder, Shingles outbreak (02/2014), and Skin cancer. here with     ICD-10-CM   1. Migraine without aura and without status migrainosus, not intractable  G43.009     She has had worsening of headaches since last visit. Heart rate remains low. We will increase gabapentin to 267m in am and 3044min  pm. She was advised to try to avoid OTC analgesics on a regular basis, no more than 1-2 times per week. CT and MRI were negative for stroke in 09/2018. Patient reports that she was told TIA can not be ruled out. We will avoid triptan medications for now but may consider these in the future if needed. I feel that anxiety is playing a significant role. I am hopeful that headaches will continue to improve with anxiety management with psychiatry. She will also follow up closely with PCP for BP review. BP elevated today but could be due to white coat. She will monitor at home and follow up with primary care for consistent readings greater than 140/90.  I will see her back in 3 months, sooner if needed.  She verbalizes understanding and agreement with this plan.   No orders of the defined types were placed in this encounter.    No orders of the defined types were placed in this encounter.     I spent 15 minutes with the patient. 50% of this time was spent  counseling and educating patient on plan of care and medications.    Debbora Presto, FNP-C 01/16/2019, 3:33 PM [facility] [address] [place], [place] [address] [phone]

## 2019-01-16 ENCOUNTER — Ambulatory Visit (INDEPENDENT_AMBULATORY_CARE_PROVIDER_SITE_OTHER): Payer: Medicare Other | Admitting: Family Medicine

## 2019-01-16 ENCOUNTER — Encounter: Payer: Self-pay | Admitting: Family Medicine

## 2019-01-16 ENCOUNTER — Other Ambulatory Visit: Payer: Self-pay

## 2019-01-16 VITALS — BP 176/75 | HR 54 | Temp 96.9°F | Ht 61.0 in | Wt 112.2 lb

## 2019-01-16 DIAGNOSIS — G43009 Migraine without aura, not intractable, without status migrainosus: Secondary | ICD-10-CM | POA: Diagnosis not present

## 2019-01-16 NOTE — Patient Instructions (Signed)
We will increase gabapentin to 200mg  in am and 300mg  in pm.   Please avoid OTC analgesics, will consider triptan therapy in future if not getting better  Please continue current treatment plan with psychiatry  Follow up with PCP for BP review as BP is elevated here.   Follow up with me in 3 months.   Migraine Headache A migraine headache is a very strong throbbing pain on one side or both sides of your head. This type of headache can also cause other symptoms. It can last from 4 hours to 3 days. Talk with your doctor about what things may bring on (trigger) this condition. What are the causes? The exact cause of this condition is not known. This condition may be triggered or caused by:  Drinking alcohol.  Smoking.  Taking medicines, such as: ? Medicine used to treat chest pain (nitroglycerin). ? Birth control pills. ? Estrogen. ? Some blood pressure medicines.  Eating or drinking certain products.  Doing physical activity. Other things that may trigger a migraine headache include:  Having a menstrual period.  Pregnancy.  Hunger.  Stress.  Not getting enough sleep or getting too much sleep.  Weather changes.  Tiredness (fatigue). What increases the risk?  Being 9-14 years old.  Being female.  Having a family history of migraine headaches.  Being Caucasian.  Having depression or anxiety.  Being very overweight. What are the signs or symptoms?  A throbbing pain. This pain may: ? Happen in any area of the head, such as on one side or both sides. ? Make it hard to do daily activities. ? Get worse with physical activity. ? Get worse around bright lights or loud noises.  Other symptoms may include: ? Feeling sick to your stomach (nauseous). ? Vomiting. ? Dizziness. ? Being sensitive to bright lights, loud noises, or smells.  Before you get a migraine headache, you may get warning signs (an aura). An aura may include: ? Seeing flashing lights or having  blind spots. ? Seeing bright spots, halos, or zigzag lines. ? Having tunnel vision or blurred vision. ? Having numbness or a tingling feeling. ? Having trouble talking. ? Having weak muscles.  Some people have symptoms after a migraine headache (postdromal phase), such as: ? Tiredness. ? Trouble thinking (concentrating). How is this treated?  Taking medicines that: ? Relieve pain. ? Relieve the feeling of being sick to your stomach. ? Prevent migraine headaches.  Treatment may also include: ? Having acupuncture. ? Avoiding foods that bring on migraine headaches. ? Learning ways to control your body functions (biofeedback). ? Therapy to help you know and deal with negative thoughts (cognitive behavioral therapy). Follow these instructions at home: Medicines  Take over-the-counter and prescription medicines only as told by your doctor.  Ask your doctor if the medicine prescribed to you: ? Requires you to avoid driving or using heavy machinery. ? Can cause trouble pooping (constipation). You may need to take these steps to prevent or treat trouble pooping:  Drink enough fluid to keep your pee (urine) pale yellow.  Take over-the-counter or prescription medicines.  Eat foods that are high in fiber. These include beans, whole grains, and fresh fruits and vegetables.  Limit foods that are high in fat and sugar. These include fried or sweet foods. Lifestyle  Do not drink alcohol.  Do not use any products that contain nicotine or tobacco, such as cigarettes, e-cigarettes, and chewing tobacco. If you need help quitting, ask your doctor.  Get at  least 8 hours of sleep every night.  Limit and deal with stress. General instructions      Keep a journal to find out what may bring on your migraine headaches. For example, write down: ? What you eat and drink. ? How much sleep you get. ? Any change in what you eat or drink. ? Any change in your medicines.  If you have a  migraine headache: ? Avoid things that make your symptoms worse, such as bright lights. ? It may help to lie down in a dark, quiet room. ? Do not drive or use heavy machinery. ? Ask your doctor what activities are safe for you.  Keep all follow-up visits as told by your doctor. This is important. Contact a doctor if:  You get a migraine headache that is different or worse than others you have had.  You have more than 15 headache days in one month. Get help right away if:  Your migraine headache gets very bad.  Your migraine headache lasts longer than 72 hours.  You have a fever.  You have a stiff neck.  You have trouble seeing.  Your muscles feel weak or like you cannot control them.  You start to lose your balance a lot.  You start to have trouble walking.  You pass out (faint).  You have a seizure. Summary  A migraine headache is a very strong throbbing pain on one side or both sides of your head. These headaches can also cause other symptoms.  This condition may be treated with medicines and changes to your lifestyle.  Keep a journal to find out what may bring on your migraine headaches.  Contact a doctor if you get a migraine headache that is different or worse than others you have had.  Contact your doctor if you have more than 15 headache days in a month. This information is not intended to replace advice given to you by your health care provider. Make sure you discuss any questions you have with your health care provider. Document Released: 12/23/2007 Document Revised: 07/07/2018 Document Reviewed: 04/27/2018 Elsevier Patient Education  2020 [employer].

## 2019-01-17 ENCOUNTER — Ambulatory Visit: Payer: Medicare Other | Admitting: Family Medicine

## 2019-01-18 DIAGNOSIS — F3181 Bipolar II disorder: Secondary | ICD-10-CM | POA: Diagnosis not present

## 2019-01-18 DIAGNOSIS — F411 Generalized anxiety disorder: Secondary | ICD-10-CM | POA: Diagnosis not present

## 2019-01-22 ENCOUNTER — Emergency Department: Payer: Medicare Other

## 2019-01-22 ENCOUNTER — Other Ambulatory Visit: Payer: Self-pay

## 2019-01-22 ENCOUNTER — Emergency Department
Admission: EM | Admit: 2019-01-22 | Discharge: 2019-01-22 | Disposition: A | Discharge status: Home / Self Care | Payer: Medicare Other | Attending: Emergency Medicine | Admitting: Emergency Medicine

## 2019-01-22 DIAGNOSIS — Z79899 Other long term (current) drug therapy: Secondary | ICD-10-CM | POA: Diagnosis not present

## 2019-01-22 DIAGNOSIS — R519 Headache, unspecified: Secondary | ICD-10-CM | POA: Diagnosis not present

## 2019-01-22 DIAGNOSIS — Z85828 Personal history of other malignant neoplasm of skin: Secondary | ICD-10-CM | POA: Diagnosis not present

## 2019-01-22 DIAGNOSIS — Z7982 Long term (current) use of aspirin: Secondary | ICD-10-CM | POA: Insufficient documentation

## 2019-01-22 DIAGNOSIS — G43909 Migraine, unspecified, not intractable, without status migrainosus: Secondary | ICD-10-CM | POA: Diagnosis not present

## 2019-01-22 DIAGNOSIS — I10 Essential (primary) hypertension: Secondary | ICD-10-CM | POA: Insufficient documentation

## 2019-01-22 DIAGNOSIS — R52 Pain, unspecified: Secondary | ICD-10-CM | POA: Diagnosis not present

## 2019-01-22 LAB — CBC WITH DIFFERENTIAL/PLATELET
Abs Immature Granulocytes: 0.01 10*3/uL (ref 0.00–0.07)
Basophils Absolute: 0 10*3/uL (ref 0.0–0.1)
Basophils Relative: 0 %
Eosinophils Absolute: 0.1 10*3/uL (ref 0.0–0.5)
Eosinophils Relative: 1 %
HCT: 36.5 % (ref 36.0–46.0)
Hemoglobin: 12.6 g/dL (ref 12.0–15.0)
Immature Granulocytes: 0 %
Lymphocytes Relative: 30 %
Lymphs Abs: 1.7 10*3/uL (ref 0.7–4.0)
MCH: 30.7 pg (ref 26.0–34.0)
MCHC: 34.5 g/dL (ref 30.0–36.0)
MCV: 88.8 fL (ref 80.0–100.0)
Monocytes Absolute: 0.5 10*3/uL (ref 0.1–1.0)
Monocytes Relative: 9 %
Neutro Abs: 3.3 10*3/uL (ref 1.7–7.7)
Neutrophils Relative %: 60 %
Platelets: 270 10*3/uL (ref 150–400)
RBC: 4.11 MIL/uL (ref 3.87–5.11)
RDW: 11.1 % — ABNORMAL LOW (ref 11.5–15.5)
WBC: 5.6 10*3/uL (ref 4.0–10.5)
nRBC: 0 % (ref 0.0–0.2)

## 2019-01-22 LAB — BASIC METABOLIC PANEL
Anion gap: 12 (ref 5–15)
BUN: 11 mg/dL (ref 8–23)
CO2: 28 mmol/L (ref 22–32)
Calcium: 9.4 mg/dL (ref 8.9–10.3)
Chloride: 95 mmol/L — ABNORMAL LOW (ref 98–111)
Creatinine, Ser: 0.62 mg/dL (ref 0.44–1.00)
GFR calc Af Amer: >60 mL/min (ref >60)
GFR calc non Af Amer: >60 mL/min (ref >60)
Glucose, Bld: 111 mg/dL — ABNORMAL HIGH (ref 70–99)
Potassium: 3.1 mmol/L — ABNORMAL LOW (ref 3.5–5.1)
Sodium: 135 mmol/L (ref 135–145)

## 2019-01-22 MED ORDER — KETOROLAC TROMETHAMINE 30 MG/ML IJ SOLN
15.0000 mg | Freq: Once | INTRAMUSCULAR | Status: AC
  Administered 2019-01-22: 15 mg via INTRAVENOUS
  Filled 2019-01-22: qty 1

## 2019-01-22 MED ORDER — SODIUM CHLORIDE 0.9 % IV BOLUS
1000.0000 mL | Freq: Once | INTRAVENOUS | Status: AC
  Administered 2019-01-22: 1000 mL via INTRAVENOUS

## 2019-01-22 MED ORDER — POTASSIUM CHLORIDE CRYS ER 20 MEQ PO TBCR
40.0000 meq | EXTENDED_RELEASE_TABLET | Freq: Once | ORAL | Status: AC
  Administered 2019-01-22: 40 meq via ORAL
  Filled 2019-01-22: qty 2

## 2019-01-22 MED ORDER — PROCHLORPERAZINE EDISYLATE 10 MG/2ML IJ SOLN
10.0000 mg | Freq: Once | INTRAMUSCULAR | Status: AC
  Administered 2019-01-22: 10 mg via INTRAVENOUS
  Filled 2019-01-22: qty 2

## 2019-01-22 MED ORDER — DIPHENHYDRAMINE HCL 50 MG/ML IJ SOLN
25.0000 mg | Freq: Once | INTRAMUSCULAR | Status: AC
  Administered 2019-01-22: 25 mg via INTRAVENOUS
  Filled 2019-01-22: qty 1

## 2019-01-22 MED ORDER — HALOPERIDOL LACTATE 5 MG/ML IJ SOLN
2.0000 mg | Freq: Once | INTRAMUSCULAR | Status: AC
  Administered 2019-01-22: 2 mg via INTRAVENOUS
  Filled 2019-01-22: qty 1

## 2019-01-22 NOTE — ED Notes (Signed)
Report given to Stephen, RN 

## 2019-01-22 NOTE — ED Provider Notes (Signed)
Assumed care from Dr. Charna Archer at 7 AM. Briefly, the patient is a 63 y.o. female with PMHx of  has a past medical history of Allergy, Anxiety, Depression, Hyperlipidemia, Hypertension, Osteopenia after menopause, Persistent headaches, Pneumonia, Postherpetic neuralgia, Renal disorder, Shingles outbreak (02/2014), and Skin cancer. here with headache. H/o chronic migraines, reportedly related to stressors w/ anxiety/depression. CT Head neg. Labs reassuring. Plan to control sx, d/c.   Labs Reviewed  BASIC METABOLIC PANEL - Abnormal; Notable for the following components:      Result Value   Potassium 3.1 (*)    Chloride 95 (*)    Glucose, Bld 111 (*)    All other components within normal limits  CBC WITH DIFFERENTIAL/PLATELET - Abnormal; Notable for the following components:   RDW 11.1 (*)    All other components within normal limits    Course of Care: ------------------------------------------ 7:42 AM on 01/22/2019 -----------------------------------------  Patient with persistent but improving headache.  Of note, she states that symptoms similar to this always occur when she has psychological stressors and she did have a significant stressor yesterday.  She has had all of these symptoms, including her headache and reported transient numbness, with previous migraines in the past.  She recently saw her psychiatrist for increasing episodes similar to this and is having her psychiatric medications increased.  Current plan is to treat symptomatically and discharge if improving.  No focal neurological exam findings.  ----------------------------------------- 9:23 AM on 01/22/2019 ----------------------------------------- Headache resolved, feeling better.  No focal neurological deficits.  Discharged with outpatient follow-up.    Duffy Bruce, MD 01/22/19 [id number]

## 2019-01-22 NOTE — Progress Notes (Signed)
I reviewed note and agree with plan.   Parnika Tweten R. Lanore Renderos, MD 01/22/2019, 11:16 AM Certified in Neurology, Neurophysiology and Neuroimaging  [facility] [address], [address] [place],  [address] [phone]  

## 2019-01-22 NOTE — ED Triage Notes (Signed)
Patient states that this is the worst headache she has ever had. Patient also states that she felt some left sided weakness. Patient has not history of stroke.

## 2019-01-22 NOTE — ED Provider Notes (Signed)
[facility] Emergency Department Provider Note   ____________________________________________   First MD Initiated Contact with Patient 01/22/19 [phone]     (approximate)  I have reviewed the triage vital signs and the nursing notes.   HISTORY  Chief Complaint Weakness and Migraine    HPI Theresa Martin is a 63 y.o. female with past medical history of migraines, hypertension, hyperlipidemia, and anxiety who presents to the ED complaining of headache.  Patient reports acute onset left-sided headache approximately 45 minutes prior to arrival.  She states she is dealt with similar left-sided headaches in the past, but this is the most severe she is ever experienced.  She states she typically has some left-sided numbness and weakness with her headache episodes and that MRI in the past has been negative for stroke.  She currently describes weakness in both of her legs, denies any weakness in her upper extremities.  She denies any numbness or changes in her vision.  She took a clonazepam prior to arrival with no improvement in her headache.        Past Medical History:  Diagnosis Date  . Allergy   . Anxiety   . Depression   . Hyperlipidemia   . Hypertension   . Osteopenia after menopause   . Persistent headaches   . Pneumonia   . Postherpetic neuralgia   . Renal disorder   . Shingles outbreak 02/2014  . Skin cancer    moles on right groin and right buttock.    Patient Active Problem List   Diagnosis Date Noted  . Abnormal chest CT 12/11/2018  . Left sided numbness 10/22/2018  . Nonallopathic lesion of thoracic region 09/05/2018  . Nonallopathic lesion of rib cage 09/05/2018  . Nonallopathic lesion of cervical region 09/05/2018  . Nonallopathic lesion of lumbosacral region 09/05/2018  . Nonallopathic lesion of sacral region 09/05/2018  . Skin cancer   . Osteopenia after menopause   . Chronic right shoulder pain 03/30/2017  . Right shoulder  pain 01/12/2017  . Tendinopathy of rotator cuff 12/20/2016  . Angle-closure glaucoma, severe stage 08/19/2015  . Osteopenia 06/19/2015  . Migraine without aura and without status migrainosus, not intractable 01/13/2015  . High risk medications (not anticoagulants) long-term use 09/26/2014  . Breast signs and symptoms 02/08/2014  . Routine general medical examination at a health care facility 07/27/2012  . Hyperlipidemia 09/29/2010  . URETEROPELVIC JUNCTION OBSTRUCTION, CONGENITAL 12/15/2009  . CONSTIPATION 12/11/2009  . CARDIAC MURMUR 11/26/2008  . MAGNETIC RESONANCE IMAGING, BRAIN, ABNORMAL 10/29/2008  . VENEREAL WART 10/23/2008  . Major depressive disorder, recurrent episode ([place]) 10/23/2008  . COMMON MIGRAINE 10/23/2008  . Essential hypertension 10/23/2008  . ALLERGIC RHINITIS 10/23/2008  . ASTHMA, INTERMITTENT, MILD 10/23/2008  . RENAL CALCULUS, HX OF 10/23/2008    Past Surgical History:  Procedure Laterality Date  . CARDIOVASCULAR STRESS TEST  02/2009   treadmill stress test: Low risk  . [place]  . COLONOSCOPY     2014/2015 [place], normal  . EXPLORATORY LAPAROTOMY     laproscopy for infertility  . EYE SURGERY Bilateral    laser-correct opening b/tn cornea and iris  . MOLE REMOVAL  2017   x 2 moles    Prior to Admission medications   Medication Sig Start Date End Date Taking? Authorizing Provider  albuterol (PROVENTIL HFA) 108 (90 Base) MCG/ACT inhaler Inhale into the lungs every 6 (six) hours as needed for wheezing or shortness of breath.    [provider]  aspirin EC 81 MG tablet Take 81 mg by mouth daily.    [provider]  atorvastatin (LIPITOR) 20 MG tablet TAKE 1 TABLET BY MOUTH EVERY DAY 11/09/18   [name], MD  beclomethasone (QVAR) 80 MCG/ACT inhaler Inhale 1-2 puffs into the lungs 2 (two) times daily. 01/11/19   Flora Lipps, MD  Calcium-Vitamin D-Vitamin K (CALCIUM + D + K PO) Take 3 each by mouth daily.  Calcium 594m- Vitamin D 1000iu- K 454m    [provider]  clonazePAM (KLONOPIN) 0.5 MG tablet Take 0.25 mg by mouth at bedtime as needed.  01/12/14   [provider]  ELDERBERRY PO Take by mouth.    [provider]  escitalopram (LEXAPRO) 10 MG tablet Take 10 mg by mouth daily.     [provider]  gabapentin (NEURONTIN) 100 MG capsule Take 1-3 capsules (100-300 mg total) by mouth 2 (two) times daily. Patient taking differently: Take 100-300 mg by mouth 2 (two) times daily. 100 in the morning and 200 at night. 01/30/18   Penumalli, ViEarlean PolkaMD  Glucosamine-Chondroitin (MOVE FREE PO) Take 1 tablet by mouth daily.    [provider]  Ketoconazole-Hydrocortisone 2 & 1 % KIT Apply topically as needed. Cracks on mouth    [provider]  lamoTRIgine (LAMICTAL) 100 MG tablet TAKE 10032mn the morning and 100 mg by mouth before bed.    [provider]  Melatonin 3 MG CAPS Take 1 capsule by mouth at bedtime.    [provider]  montelukast (SINGULAIR) 10 MG tablet Take 1 tablet (10 mg total) by mouth at bedtime. 09/27/18   MarBrunetta JeansA-C  Naproxen Sodium (ALEVE) 220 MG CAPS As needed for headache 04/15/15   Penumalli, VikEarlean PolkaD  traZODone (DESYREL) 50 MG tablet Take 50 mg by mouth at bedtime as needed for sleep.  05/29/15   [provider]  triamterene-hydrochlorothiazide (MAXZIDE-25) 37.5-25 MG tablet TAKE 1 TABLET BY MOUTH EVERY DAY 10/04/18   TabMidge MiniumD    Allergies Patient has no known allergies.  Family History  Problem Relation Age of Onset  . Hypertension Mother   . Coronary artery disease Mother   . Heart failure Mother   . Hypertension Father   . Liver disease Sister        liver failure  . Arrhythmia Brother   . Hypertension Brother   . Cancer Neg Hx        colon   . Breast cancer Neg Hx     Social History Social History   Tobacco Use  . Smoking status: Never Smoker  .  Smokeless tobacco: Never Used  Substance Use Topics  . Alcohol use: No    Alcohol/week: 0.0 standard drinks  . Drug use: No    Review of Systems  Constitutional: No fever/chills Eyes: No visual changes. ENT: No sore throat. Cardiovascular: Denies chest pain. Respiratory: Denies shortness of breath. Gastrointestinal: No abdominal pain.  No nausea, no vomiting.  No diarrhea.  No constipation. Genitourinary: Negative for dysuria. Musculoskeletal: Negative for back pain. Skin: Negative for rash. Neurological: Positive for headache and lower extremity weakness, negative for numbness.  ____________________________________________   PHYSICAL EXAM:  VITAL SIGNS: ED Triage Vitals  Enc Vitals Group     BP      Pulse      Resp      Temp      Temp src  SpO2      Weight      Height      Head Circumference      Peak Flow      Pain Score      Pain Loc      Pain Edu?      Excl. in [place]?     Constitutional: Alert and oriented. Eyes: Conjunctivae are normal.  Pupils equal round and reactive to light bilaterally, extraocular movements intact. Head: Atraumatic. Nose: No congestion/rhinnorhea. Mouth/Throat: Mucous membranes are moist. Neck: Normal ROM Cardiovascular: Normal rate, regular rhythm. Grossly normal heart sounds. Respiratory: Normal respiratory effort.  No retractions. Lungs CTAB. Gastrointestinal: Soft and nontender. No distention. Genitourinary: deferred Musculoskeletal: No lower extremity tenderness nor edema. Neurologic:  Normal speech and language.  4 out of 5 strength in bilateral lower extremities, 5 out of 5 strength in bilateral upper extremities with no pronator drift. Skin:  Skin is warm, dry and intact. No rash noted. Psychiatric: Mood and affect are normal. Speech and behavior are normal.  ____________________________________________   LABS (all labs ordered are listed, but only abnormal results are displayed)  Labs Reviewed  BASIC METABOLIC PANEL  - Abnormal; Notable for the following components:      Result Value   Potassium 3.1 (*)    Chloride 95 (*)    Glucose, Bld 111 (*)    All other components within normal limits  CBC WITH DIFFERENTIAL/PLATELET - Abnormal; Notable for the following components:   RDW 11.1 (*)    All other components within normal limits     PROCEDURES  Procedure(s) performed (including Critical Care):  Procedures   ____________________________________________   INITIAL IMPRESSION / ASSESSMENT AND PLAN / ED COURSE       63 year old female with history of migraines presents to the ED complaining of acute onset headache 45 minutes prior to arrival.  While she has a history of similar headaches, she describes this as the most severe.  Given this, will obtain head CT to rule out subarachnoid hemorrhage.  If this is negative within the first 6 hours of symptoms, would effectively rule out SAH.  Doubt meningitis as she has no fevers or neck stiffness.  Will also screen labs but if work-up negative, suspect symptoms related to recurrent migraine.  Will treat with migraine cocktail and reevaluate.  Patient turned over to Dr. Pattricia Boss pending reevaluation.  CT head is negative for acute process and labs are unremarkable outside of mild hypokalemia.      ____________________________________________   FINAL CLINICAL IMPRESSION(S) / ED DIAGNOSES  Final diagnoses:  Migraine without status migrainosus, not intractable, unspecified migraine type     ED Discharge Orders    None       Note:  This document was prepared using Dragon voice recognition software and may include unintentional dictation errors.   Blake Divine, MD 01/22/19 [phone]

## 2019-01-26 ENCOUNTER — Ambulatory Visit (INDEPENDENT_AMBULATORY_CARE_PROVIDER_SITE_OTHER): Payer: Medicare Other | Admitting: Family Medicine

## 2019-01-26 ENCOUNTER — Encounter: Payer: Self-pay | Admitting: Family Medicine

## 2019-01-26 ENCOUNTER — Other Ambulatory Visit: Payer: Self-pay

## 2019-01-26 VITALS — BP 126/78 | HR 72 | Temp 97.9°F | Resp 16 | Ht 61.0 in | Wt 111.2 lb

## 2019-01-26 DIAGNOSIS — Z23 Encounter for immunization: Secondary | ICD-10-CM | POA: Diagnosis not present

## 2019-01-26 DIAGNOSIS — F411 Generalized anxiety disorder: Secondary | ICD-10-CM | POA: Diagnosis not present

## 2019-01-26 DIAGNOSIS — I10 Essential (primary) hypertension: Secondary | ICD-10-CM

## 2019-01-26 DIAGNOSIS — G43009 Migraine without aura, not intractable, without status migrainosus: Secondary | ICD-10-CM | POA: Diagnosis not present

## 2019-01-26 DIAGNOSIS — F331 Major depressive disorder, recurrent, moderate: Secondary | ICD-10-CM

## 2019-01-26 DIAGNOSIS — I639 Cerebral infarction, unspecified: Secondary | ICD-10-CM

## 2019-01-26 DIAGNOSIS — F3181 Bipolar II disorder: Secondary | ICD-10-CM | POA: Diagnosis not present

## 2019-01-26 NOTE — Patient Instructions (Signed)
Follow up as needed or as scheduled Continue to take the medication as directed by psychiatry Consider restarting talk therapy- it could be very helpful! The most important thing for your physical and emotional health is to take your medication Call with any questions or concerns Hang in there!

## 2019-01-26 NOTE — Assessment & Plan Note (Signed)
Currently asymptomatic but has had quite a number of recent HAs due to stress and anxiety.  She is working with both psychiatry and neuro on this.  Stressed the need for her to take her medications as directed.  Will follow along.

## 2019-01-26 NOTE — Assessment & Plan Note (Signed)
Deteriorated.  Pt felt she was able to wean off all of her medication and psych agreed.  She was 'ok' until she wasn't and things have been quite a roller coaster recently.  Family is now providing 24 hr supervision and assisting w/ medication.  She is working w/ psych to get back on a stable regimen.  Encouraged her to stick with it.

## 2019-01-26 NOTE — Assessment & Plan Note (Signed)
Well controlled today.  No changes at this time

## 2019-01-26 NOTE — Progress Notes (Signed)
   Subjective:    Patient ID: Theresa Martin, female    DOB: [birth year], 64 y.o.   MRN: [id number]  HPI ER f/u- pt went to ER on 10/26 for migraine.  EMS had to be called 'b/c I was completely out of it'.  This is after seeing Neuro on 10/20.  She reports she had similar sxs w/ significant psychological stressors in the past.  Her psychiatrist is in the process of adjusting meds.  They had weaned her medication b/c pt wanted to come off medication and this triggered this sequence of events.  This is the 2nd time that stopping medications has caused significant problems.  They are restarting Seroquel and 'anxiety pills'.  Brother reports that she now needs assistance w/ medications, 'can't remember anything'.  They report inability to focus.  'As soon as she thinks of something distressing, it triggers the migraine'.  She has had 24/7 supervision since ER visit.  Neurologist had increased Gabapentin to 200mg  QAM and 300mg  QPM.  At time of neuro visit, BP was elevated.  Today is normal.     Review of Systems For ROS see HPI     Objective:   Physical Exam Vitals signs reviewed.  Constitutional:      General: She is not in acute distress.    Appearance: Normal appearance. She is normal weight. She is not ill-appearing.  HENT:     Head: Normocephalic and atraumatic.  Neurological:     General: No focal deficit present.     Mental Status: She is alert and oriented to person, place, and time.  Psychiatric:        Mood and Affect: Mood normal.        Behavior: Behavior normal.        Thought Content: Thought content normal.           Assessment & Plan:

## 2019-01-28 ENCOUNTER — Encounter: Payer: Self-pay | Admitting: Emergency Medicine

## 2019-01-28 ENCOUNTER — Other Ambulatory Visit: Payer: Self-pay

## 2019-01-28 ENCOUNTER — Emergency Department
Admission: EM | Admit: 2019-01-28 | Discharge: 2019-01-28 | Disposition: A | Discharge status: Home / Self Care | Payer: Medicare Other | Attending: Emergency Medicine | Admitting: Emergency Medicine

## 2019-01-28 DIAGNOSIS — I10 Essential (primary) hypertension: Secondary | ICD-10-CM | POA: Diagnosis not present

## 2019-01-28 DIAGNOSIS — Z046 Encounter for general psychiatric examination, requested by authority: Secondary | ICD-10-CM | POA: Diagnosis present

## 2019-01-28 DIAGNOSIS — F419 Anxiety disorder, unspecified: Secondary | ICD-10-CM | POA: Diagnosis not present

## 2019-01-28 DIAGNOSIS — F331 Major depressive disorder, recurrent, moderate: Secondary | ICD-10-CM | POA: Insufficient documentation

## 2019-01-28 DIAGNOSIS — Z7982 Long term (current) use of aspirin: Secondary | ICD-10-CM | POA: Diagnosis not present

## 2019-01-28 DIAGNOSIS — Z85828 Personal history of other malignant neoplasm of skin: Secondary | ICD-10-CM | POA: Insufficient documentation

## 2019-01-28 DIAGNOSIS — J45909 Unspecified asthma, uncomplicated: Secondary | ICD-10-CM | POA: Insufficient documentation

## 2019-01-28 DIAGNOSIS — Z79899 Other long term (current) drug therapy: Secondary | ICD-10-CM | POA: Insufficient documentation

## 2019-01-28 LAB — URINE DRUG SCREEN, QUALITATIVE (ARMC ONLY)
Amphetamines, Ur Screen: NOT DETECTED
Barbiturates, Ur Screen: NOT DETECTED
Benzodiazepine, Ur Scrn: NOT DETECTED
Cannabinoid 50 Ng, Ur ~~LOC~~: NOT DETECTED
Cocaine Metabolite,Ur ~~LOC~~: NOT DETECTED
MDMA (Ecstasy)Ur Screen: NOT DETECTED
Methadone Scn, Ur: NOT DETECTED
Opiate, Ur Screen: NOT DETECTED
Phencyclidine (PCP) Ur S: NOT DETECTED
Tricyclic, Ur Screen: NOT DETECTED

## 2019-01-28 LAB — COMPREHENSIVE METABOLIC PANEL
ALT: 21 U/L (ref 0–44)
AST: 27 U/L (ref 15–41)
Albumin: 4.5 g/dL (ref 3.5–5.0)
Alkaline Phosphatase: 74 U/L (ref 38–126)
Anion gap: 14 (ref 5–15)
BUN: 7 mg/dL — ABNORMAL LOW (ref 8–23)
CO2: 27 mmol/L (ref 22–32)
Calcium: 9.7 mg/dL (ref 8.9–10.3)
Chloride: 95 mmol/L — ABNORMAL LOW (ref 98–111)
Creatinine, Ser: 0.58 mg/dL (ref 0.44–1.00)
GFR calc Af Amer: >60 mL/min (ref >60)
GFR calc non Af Amer: >60 mL/min (ref >60)
Glucose, Bld: 109 mg/dL — ABNORMAL HIGH (ref 70–99)
Potassium: 3.4 mmol/L — ABNORMAL LOW (ref 3.5–5.1)
Sodium: 136 mmol/L (ref 135–145)
Total Bilirubin: 0.4 mg/dL (ref 0.3–1.2)
Total Protein: 8.1 g/dL (ref 6.5–8.1)

## 2019-01-28 LAB — CBC WITH DIFFERENTIAL/PLATELET
Abs Immature Granulocytes: 0.02 10*3/uL (ref 0.00–0.07)
Basophils Absolute: 0 10*3/uL (ref 0.0–0.1)
Basophils Relative: 0 %
Eosinophils Absolute: 0.1 10*3/uL (ref 0.0–0.5)
Eosinophils Relative: 1 %
HCT: 36.8 % (ref 36.0–46.0)
Hemoglobin: 12.4 g/dL (ref 12.0–15.0)
Immature Granulocytes: 0 %
Lymphocytes Relative: 14 %
Lymphs Abs: 1.2 10*3/uL (ref 0.7–4.0)
MCH: 30.2 pg (ref 26.0–34.0)
MCHC: 33.7 g/dL (ref 30.0–36.0)
MCV: 89.8 fL (ref 80.0–100.0)
Monocytes Absolute: 0.7 10*3/uL (ref 0.1–1.0)
Monocytes Relative: 8 %
Neutro Abs: 6.8 10*3/uL (ref 1.7–7.7)
Neutrophils Relative %: 77 %
Platelets: 325 10*3/uL (ref 150–400)
RBC: 4.1 MIL/uL (ref 3.87–5.11)
RDW: 10.9 % — ABNORMAL LOW (ref 11.5–15.5)
WBC: 8.9 10*3/uL (ref 4.0–10.5)
nRBC: 0 % (ref 0.0–0.2)

## 2019-01-28 LAB — URINALYSIS, COMPLETE (UACMP) WITH MICROSCOPIC
Bacteria, UA: NONE SEEN
Bilirubin Urine: NEGATIVE
Glucose, UA: NEGATIVE mg/dL
Hgb urine dipstick: NEGATIVE
Ketones, ur: NEGATIVE mg/dL
Leukocytes,Ua: NEGATIVE
Nitrite: NEGATIVE
Protein, ur: NEGATIVE mg/dL
Specific Gravity, Urine: 1.001 — ABNORMAL LOW (ref 1.005–1.030)
Squamous Epithelial / HPF: NONE SEEN (ref 0–5)
WBC, UA: NONE SEEN WBC/hpf (ref 0–5)
pH: 7 (ref 5.0–8.0)

## 2019-01-28 LAB — ACETAMINOPHEN LEVEL: Acetaminophen (Tylenol), Serum: <10 ug/mL — ABNORMAL LOW (ref 10–30)

## 2019-01-28 LAB — SALICYLATE LEVEL: Salicylate Lvl: <7 mg/dL (ref 2.8–30.0)

## 2019-01-28 LAB — ETHANOL: Alcohol, Ethyl (B): <10 mg/dL (ref <10)

## 2019-01-28 MED ORDER — LORAZEPAM 0.5 MG PO TABS
0.5000 mg | ORAL_TABLET | Freq: Once | ORAL | Status: AC
  Administered 2019-01-28: 0.5 mg via ORAL
  Filled 2019-01-28: qty 1

## 2019-01-28 MED ORDER — ACETAMINOPHEN 500 MG PO TABS
ORAL_TABLET | ORAL | Status: AC
  Filled 2019-01-28: qty 2

## 2019-01-28 MED ORDER — ACETAMINOPHEN 500 MG PO TABS
1000.0000 mg | ORAL_TABLET | Freq: Once | ORAL | Status: AC
  Administered 2019-01-28: 1000 mg via ORAL

## 2019-01-28 MED ORDER — MELATONIN 5 MG PO CAPS: 5.0000 mg | ORAL_CAPSULE | Freq: Every day | ORAL | 0 refills | Status: AC

## 2019-01-28 NOTE — ED Notes (Signed)
Telepsych in progress. 

## 2019-01-28 NOTE — ED Notes (Signed)
Tele psych placed in room.

## 2019-01-28 NOTE — ED Provider Notes (Signed)
[facility] Emergency Department Provider Note   ____________________________________________   First MD Initiated Contact with Patient 01/28/19 [phone]     (approximate)  I have reviewed the triage vital signs and the nursing notes.   HISTORY  Chief Complaint Psychiatric Evaluation    HPI Theresa Martin is a 63 y.o. female who presents to the ED from home for behavioral medicine evaluation.  Patient has been working with her psychiatrist to wean herself off of her psych medications.  She was taken off Seroquel extended release and placed on regular Seroquel which she started yesterday.  Family states she has been "talking crazy" with heightened anxiety.  Denies active SI/HI/AH/VH.  Voices no medical complaints.       Past Medical History:  Diagnosis Date  . Allergy   . Anxiety   . Depression   . Hyperlipidemia   . Hypertension   . Osteopenia after menopause   . Persistent headaches   . Pneumonia   . Postherpetic neuralgia   . Renal disorder   . Shingles outbreak 02/2014  . Skin cancer    moles on right groin and right buttock.    Patient Active Problem List   Diagnosis Date Noted  . Abnormal chest CT 12/11/2018  . Left sided numbness 10/22/2018  . Nonallopathic lesion of thoracic region 09/05/2018  . Nonallopathic lesion of rib cage 09/05/2018  . Nonallopathic lesion of cervical region 09/05/2018  . Nonallopathic lesion of lumbosacral region 09/05/2018  . Nonallopathic lesion of sacral region 09/05/2018  . Skin cancer   . Osteopenia after menopause   . Chronic right shoulder pain 03/30/2017  . Right shoulder pain 01/12/2017  . Tendinopathy of rotator cuff 12/20/2016  . Angle-closure glaucoma, severe stage 08/19/2015  . Migraine without aura and without status migrainosus, not intractable 01/13/2015  . High risk medications (not anticoagulants) long-term use 09/26/2014  . Routine general medical examination at a health care facility  07/27/2012  . Hyperlipidemia 09/29/2010  . URETEROPELVIC JUNCTION OBSTRUCTION, CONGENITAL 12/15/2009  . CONSTIPATION 12/11/2009  . CARDIAC MURMUR 11/26/2008  . MAGNETIC RESONANCE IMAGING, BRAIN, ABNORMAL 10/29/2008  . VENEREAL WART 10/23/2008  . Major depressive disorder, recurrent episode ([place]) 10/23/2008  . Essential hypertension 10/23/2008  . ALLERGIC RHINITIS 10/23/2008  . ASTHMA, INTERMITTENT, MILD 10/23/2008  . RENAL CALCULUS, HX OF 10/23/2008    Past Surgical History:  Procedure Laterality Date  . CARDIOVASCULAR STRESS TEST  02/2009   treadmill stress test: Low risk  . [place]  . COLONOSCOPY     2014/2015 [place], normal  . EXPLORATORY LAPAROTOMY     laproscopy for infertility  . EYE SURGERY Bilateral    laser-correct opening b/tn cornea and iris  . MOLE REMOVAL  2017   x 2 moles    Prior to Admission medications   Medication Sig Start Date End Date Taking? Authorizing Provider  albuterol (PROVENTIL HFA) 108 (90 Base) MCG/ACT inhaler Inhale into the lungs every 6 (six) hours as needed for wheezing or shortness of breath.    [provider]  aspirin EC 81 MG tablet Take 81 mg by mouth daily.    [provider]  atorvastatin (LIPITOR) 20 MG tablet TAKE 1 TABLET BY MOUTH EVERY DAY 11/09/18   [name], MD  beclomethasone (QVAR) 80 MCG/ACT inhaler Inhale 1-2 puffs into the lungs 2 (two) times daily. 01/11/19   Flora Lipps, MD  Calcium-Vitamin D-Vitamin K (CALCIUM + D + K PO) Take 3 each  by mouth daily. Calcium 540m- Vitamin D 1000iu- K 442m    [provider]  clonazePAM (KLONOPIN) 0.5 MG tablet Take 0.25 mg by mouth at bedtime as needed.  01/12/14   [provider]  ELDERBERRY PO Take by mouth.    [provider]  escitalopram (LEXAPRO) 10 MG tablet Take 10 mg by mouth daily.     [provider]  gabapentin (NEURONTIN) 100 MG capsule Take 1-3 capsules (100-300 mg total) by mouth 2  (two) times daily. Patient taking differently: Take 100-300 mg by mouth 2 (two) times daily. 100 in the morning and 200 at night. 01/30/18   Penumalli, ViEarlean PolkaMD  Glucosamine-Chondroitin (MOVE FREE PO) Take 1 tablet by mouth daily.    [provider]  Ketoconazole-Hydrocortisone 2 & 1 % KIT Apply topically as needed. Cracks on mouth    [provider]  lamoTRIgine (LAMICTAL) 100 MG tablet Take 100 mg by mouth daily.     [provider]  Melatonin 3 MG CAPS Take 1 capsule by mouth at bedtime.    [provider]  montelukast (SINGULAIR) 10 MG tablet Take 1 tablet (10 mg total) by mouth at bedtime. 09/27/18   MaBrunetta JeansPA-C  Naproxen Sodium (ALEVE) 220 MG CAPS As needed for headache 04/15/15   Penumalli, ViEarlean PolkaMD  QUEtiapine (SEROQUEL) 100 MG tablet Take 100 mg by mouth 3 (three) times daily. Pt takes 1 tablet in the AM and 2 tablets in the PM    [provider]  triamterene-hydrochlorothiazide (MAXZIDE-25) 37.5-25 MG tablet TAKE 1 TABLET BY MOUTH EVERY DAY 10/04/18   TaMidge MiniumMD    Allergies Patient has no known allergies.  Family History  Problem Relation Age of Onset  . Hypertension Mother   . Coronary artery disease Mother   . Heart failure Mother   . Hypertension Father   . Liver disease Sister        liver failure  . Arrhythmia Brother   . Hypertension Brother   . Cancer Neg Hx        colon   . Breast cancer Neg Hx     Social History Social History   Tobacco Use  . Smoking status: Never Smoker  . Smokeless tobacco: Never Used  Substance Use Topics  . Alcohol use: No    Alcohol/week: 0.0 standard drinks  . Drug use: No    Review of Systems  Constitutional: No fever/chills Eyes: No visual changes. ENT: No sore throat. Cardiovascular: Denies chest pain. Respiratory: Denies shortness of breath. Gastrointestinal: No abdominal pain.  No nausea, no vomiting.  No diarrhea.  No constipation. Genitourinary:  Negative for dysuria. Musculoskeletal: Negative for back pain. Skin: Negative for rash. Neurological: Negative for headaches, focal weakness or numbness. Psychiatric:  Positive for psychiatric medication adjustments and heightened anxiety.  ____________________________________________   PHYSICAL EXAM:  VITAL SIGNS: ED Triage Vitals  Enc Vitals Group     BP 01/28/19 0414 (!) 134/114     Pulse Rate 01/28/19 0414 84     Resp 01/28/19 0414 16     Temp 01/28/19 0414 98.3 F (36.8 C)     Temp Source 01/28/19 0414 Oral     SpO2 01/28/19 0414 99 %     Weight 01/28/19 0422 111 lb (50.3 kg)     Height 01/28/19 0422 _0  (1.549 m)     Head Circumference --      Peak Flow --  Pain Score 01/28/19 0421 10     Pain Loc --      Pain Edu? --      Excl. in [place]? --     Constitutional: Asleep, awakens for exam.  Well appearing and in no acute distress. Eyes: Conjunctivae are normal. PERRL. EOMI. Head: Atraumatic. Nose: No congestion/rhinnorhea. Mouth/Throat: Mucous membranes are moist.  Oropharynx non-erythematous. Neck: No stridor.   Cardiovascular: Normal rate, regular rhythm. Grossly normal heart sounds.  Good peripheral circulation. Respiratory: Normal respiratory effort.  No retractions. Lungs CTAB. Gastrointestinal: Soft and nontender. No distention. No abdominal bruits. No CVA tenderness. Musculoskeletal: No lower extremity tenderness nor edema.  No joint effusions. Neurologic:  Normal speech and language. No gross focal neurologic deficits are appreciated. No gait instability. Skin:  Skin is warm, dry and intact. No rash noted. Psychiatric: Mood and affect are normal. Speech and behavior are normal.  ____________________________________________   LABS (all labs ordered are listed, but only abnormal results are displayed)  Labs Reviewed  COMPREHENSIVE METABOLIC PANEL - Abnormal; Notable for the following components:      Result Value   Potassium 3.4 (*)    Chloride 95 (*)     Glucose, Bld 109 (*)    BUN 7 (*)    All other components within normal limits  ACETAMINOPHEN LEVEL - Abnormal; Notable for the following components:   Acetaminophen (Tylenol), Serum <10 (*)    All other components within normal limits  URINALYSIS, COMPLETE (UACMP) WITH MICROSCOPIC - Abnormal; Notable for the following components:   Color, Urine COLORLESS (*)    APPearance CLEAR (*)    Specific Gravity, Urine 1.001 (*)    All other components within normal limits  CBC WITH DIFFERENTIAL/PLATELET - Abnormal; Notable for the following components:   RDW 10.9 (*)    All other components within normal limits  ETHANOL  SALICYLATE LEVEL  URINE DRUG SCREEN, QUALITATIVE ([facility] ONLY)  CBC WITH DIFFERENTIAL/PLATELET   ____________________________________________  EKG  None ____________________________________________  RADIOLOGY  ED MD interpretation: None  Official radiology report(s): No results found.  ____________________________________________   PROCEDURES  Procedure(s) performed (including Critical Care):  Procedures   ____________________________________________   INITIAL IMPRESSION / ASSESSMENT AND PLAN / ED COURSE  As part of my medical decision making, I reviewed the following data within the [place] notes reviewed and incorporated, Labs reviewed, Old chart reviewed, A consult was requested and obtained from this/these consultant(s) Psychiatry and Notes from prior ED visits     TEDDI BADALAMENTI was evaluated in Emergency Department on 01/28/2019 for the symptoms described in the history of present illness. She was evaluated in the context of the global COVID-19 pandemic, which necessitated consideration that the patient might be at risk for infection with the SARS-CoV-2 virus that causes COVID-19. Institutional protocols and algorithms that pertain to the evaluation of patients at risk for COVID-19 are in a state of rapid change based  on information released by regulatory bodies including the CDC and federal and state organizations. These policies and algorithms were followed during the patient's care in the ED.    63 year old female undergoing psychiatric medication adjustments with worsening anxiety.  Laboratory and urinalysis results noted.  Patient is medically cleared for psychiatric evaluation and disposition.      ____________________________________________   FINAL CLINICAL IMPRESSION(S) / ED DIAGNOSES  Final diagnoses:  Anxiety  Moderate episode of recurrent major depressive disorder [facility])     ED Discharge Orders    None  Note:  This document was prepared using Dragon voice recognition software and may include unintentional dictation errors.   Paulette Blanch, MD 01/28/19 [phone]

## 2019-01-28 NOTE — ED Notes (Signed)
Pt. States along with her psychiatrist she weaned herself off psych. Medications.  Pt. States she was started on Seroquel yesterday.  Pt. States she had been on Seroquel ER in the past.  Pt. Denies SI/HI and AV/H.  Pt. Appears anxious.

## 2019-01-28 NOTE — ED Notes (Signed)
Patient brother Juanda Crumble called and advised of her discharge. Will be over to pick her up.

## 2019-01-28 NOTE — ED Notes (Signed)
Pt very confused, unable to dress herself at this time. Pt's own black pants on

## 2019-01-28 NOTE — ED Notes (Addendum)
Black tennis shoes, black socks, pink shirt, gray sports bra, tan underwear placed in hospital belongings bag. Watch and earrings sent home with brother.

## 2019-01-28 NOTE — ED Notes (Signed)
Pt in shower.  

## 2019-01-28 NOTE — ED Notes (Signed)
Pt walking around room; pt c/o pain in her legs and head, also states that her feet are cold. Pt reminded that her socks are on her bed and she could put them on at will. Pt appears very anxious. Requested and received verbal order for tylenol. Pt given tylenol and told we will reevaluate its effectiveness in a while.

## 2019-01-28 NOTE — ED Notes (Signed)
Pt password determined by pts brother: "[email or web address]"

## 2019-01-28 NOTE — ED Provider Notes (Signed)
3:43 PM Assumed care for off going team.   Blood pressure (!) 156/73, pulse 74, temperature 98 F (36.7 C), temperature source Oral, resp. rate 18, height 5\' 1"  (1.549 m), weight 50.3 kg, SpO2 98 %.  See their HPI for full report but in brief came in voluntary for increasing anxiety.   Plan from psychiatry.  Patient does not meet IVC or admission to inpatient criteria.  Recommended increasing the Seroquel to 200 nightly.  Increase melatonin to 5 mg nightly.  Continue Klonopin for now but recommend in the benzos are not a great long-term solution.  Referral to neurologist for consideration of dementia work-up.  3:49 PM re-evaluate patient and she felt comfortable with this plan.  I discussed the provisional nature of ED diagnosis, the treatment so far, the ongoing plan of care, follow up appointments and return precautions with the patient and any family or support people present. They expressed understanding and agreed with the plan, discharged home.             Vanessa , MD 01/28/19 [phone]

## 2019-01-28 NOTE — ED Notes (Signed)
Assumed care of patient patient pacing in room crying and stating she is scared, patient reassure for her safety. Was taught breathing excersice  for coping . Patient was given meds prior to writters arrival. Will monitor for effects.

## 2019-01-28 NOTE — Discharge Instructions (Signed)
Recommended increasing the Seroquel to 200 nightly.  Increase melatonin to 5 mg nightly.  Continue Klonopin for now but recommend in the benzos are not a great long-term solution.  Referral to neurologist for consideration of dementia work-up.

## 2019-01-28 NOTE — ED Notes (Signed)
Pt. States she is cold, pt given additional blankets.

## 2019-01-28 NOTE — ED Notes (Signed)
Pt states her legs are better but that she still has a headache. Pt states she normally takes propanolol for headaches.

## 2019-01-28 NOTE — ED Notes (Signed)
SOC completed awaiting recommendations from psych. Multiple calls received from patient family, as per patient verbalized consent to give her brother Juanda Crumble information on her status.

## 2019-01-28 NOTE — ED Notes (Signed)
Resumed care from [place], [place]. Orders reviewed; pt resting.

## 2019-01-28 NOTE — ED Triage Notes (Signed)
Pt here due to worsening of her mental health issues after trying to wean herself off of her psych meds. She started new medication yesterday, prescribed by her psych Dr. Johnathan Hausen in with pt at triage states she has been "talking crazy" and about things from the past, severely anxious, states they waited at home as long as they could hoping the new medication would balance her out but it did not.

## 2019-01-29 ENCOUNTER — Ambulatory Visit: Payer: Medicare Other | Admitting: Internal Medicine

## 2019-01-30 ENCOUNTER — Other Ambulatory Visit: Payer: Self-pay

## 2019-01-30 ENCOUNTER — Emergency Department (HOSPITAL_COMMUNITY)
Admission: EM | Admit: 2019-01-30 | Discharge: 2019-01-30 | Discharge status: Against Medical Advice | Payer: Medicare Other

## 2019-01-30 NOTE — ED Notes (Signed)
Called pt x1 from lobby No response

## 2019-01-31 ENCOUNTER — Emergency Department (HOSPITAL_COMMUNITY)
Admission: EM | Admit: 2019-01-31 | Discharge: 2019-02-01 | Disposition: A | Discharge status: Other Acute Inpatient Hospital | Payer: Medicare Other | Attending: Emergency Medicine | Admitting: Emergency Medicine

## 2019-01-31 ENCOUNTER — Encounter (HOSPITAL_COMMUNITY): Payer: Self-pay | Admitting: Obstetrics and Gynecology

## 2019-01-31 DIAGNOSIS — Z20828 Contact with and (suspected) exposure to other viral communicable diseases: Secondary | ICD-10-CM | POA: Insufficient documentation

## 2019-01-31 DIAGNOSIS — J45909 Unspecified asthma, uncomplicated: Secondary | ICD-10-CM | POA: Insufficient documentation

## 2019-01-31 DIAGNOSIS — Z79899 Other long term (current) drug therapy: Secondary | ICD-10-CM | POA: Diagnosis not present

## 2019-01-31 DIAGNOSIS — Z03818 Encounter for observation for suspected exposure to other biological agents ruled out: Secondary | ICD-10-CM | POA: Diagnosis not present

## 2019-01-31 DIAGNOSIS — I10 Essential (primary) hypertension: Secondary | ICD-10-CM | POA: Diagnosis not present

## 2019-01-31 DIAGNOSIS — F309 Manic episode, unspecified: Secondary | ICD-10-CM | POA: Diagnosis not present

## 2019-01-31 DIAGNOSIS — F3113 Bipolar disorder, current episode manic without psychotic features, severe: Secondary | ICD-10-CM | POA: Insufficient documentation

## 2019-01-31 DIAGNOSIS — F3010 Manic episode without psychotic symptoms, unspecified: Secondary | ICD-10-CM

## 2019-01-31 DIAGNOSIS — Z7982 Long term (current) use of aspirin: Secondary | ICD-10-CM | POA: Insufficient documentation

## 2019-01-31 LAB — CBC
HCT: 36.8 % (ref 36.0–46.0)
Hemoglobin: 12.3 g/dL (ref 12.0–15.0)
MCH: 30.7 pg (ref 26.0–34.0)
MCHC: 33.4 g/dL (ref 30.0–36.0)
MCV: 91.8 fL (ref 80.0–100.0)
Platelets: 326 10*3/uL (ref 150–400)
RBC: 4.01 MIL/uL (ref 3.87–5.11)
RDW: 11.2 % — ABNORMAL LOW (ref 11.5–15.5)
WBC: 6.9 10*3/uL (ref 4.0–10.5)
nRBC: 0 % (ref 0.0–0.2)

## 2019-01-31 LAB — URINALYSIS, ROUTINE W REFLEX MICROSCOPIC
Bacteria, UA: NONE SEEN
Bilirubin Urine: NEGATIVE
Glucose, UA: NEGATIVE mg/dL
Ketones, ur: 20 mg/dL — AB
Leukocytes,Ua: NEGATIVE
Nitrite: NEGATIVE
Protein, ur: NEGATIVE mg/dL
Specific Gravity, Urine: 1.004 — ABNORMAL LOW (ref 1.005–1.030)
pH: 7 (ref 5.0–8.0)

## 2019-01-31 LAB — COMPREHENSIVE METABOLIC PANEL
ALT: 19 U/L (ref 0–44)
AST: 21 U/L (ref 15–41)
Albumin: 4.3 g/dL (ref 3.5–5.0)
Alkaline Phosphatase: 76 U/L (ref 38–126)
Anion gap: 10 (ref 5–15)
BUN: 7 mg/dL — ABNORMAL LOW (ref 8–23)
CO2: 27 mmol/L (ref 22–32)
Calcium: 9.1 mg/dL (ref 8.9–10.3)
Chloride: 96 mmol/L — ABNORMAL LOW (ref 98–111)
Creatinine, Ser: 0.65 mg/dL (ref 0.44–1.00)
GFR calc Af Amer: >60 mL/min (ref >60)
GFR calc non Af Amer: >60 mL/min (ref >60)
Glucose, Bld: 98 mg/dL (ref 70–99)
Potassium: 3 mmol/L — ABNORMAL LOW (ref 3.5–5.1)
Sodium: 133 mmol/L — ABNORMAL LOW (ref 135–145)
Total Bilirubin: 0.8 mg/dL (ref 0.3–1.2)
Total Protein: 7.5 g/dL (ref 6.5–8.1)

## 2019-01-31 LAB — RAPID URINE DRUG SCREEN, HOSP PERFORMED
Amphetamines: NOT DETECTED
Barbiturates: NOT DETECTED
Benzodiazepines: NOT DETECTED
Cocaine: NOT DETECTED
Opiates: NOT DETECTED
Tetrahydrocannabinol: NOT DETECTED

## 2019-01-31 LAB — ACETAMINOPHEN LEVEL: Acetaminophen (Tylenol), Serum: <10 ug/mL — ABNORMAL LOW (ref 10–30)

## 2019-01-31 LAB — SALICYLATE LEVEL: Salicylate Lvl: <7 mg/dL (ref 2.8–30.0)

## 2019-01-31 LAB — ETHANOL: Alcohol, Ethyl (B): <10 mg/dL (ref <10)

## 2019-01-31 MED ORDER — ZIPRASIDONE MESYLATE 20 MG IM SOLR
20.0000 mg | Freq: Two times a day (BID) | INTRAMUSCULAR | Status: DC | PRN
  Administered 2019-01-31: 14:00:00 20 mg via INTRAMUSCULAR
  Filled 2019-01-31: qty 20

## 2019-01-31 MED ORDER — STERILE WATER FOR INJECTION IJ SOLN
INTRAMUSCULAR | Status: AC
  Administered 2019-01-31: 14:00:00
  Filled 2019-01-31: qty 10

## 2019-01-31 MED ORDER — LORAZEPAM 1 MG PO TABS
1.0000 mg | ORAL_TABLET | ORAL | Status: AC | PRN
  Administered 2019-01-31: 12:00:00 1 mg via ORAL
  Filled 2019-01-31: qty 1

## 2019-01-31 MED ORDER — POTASSIUM CHLORIDE CRYS ER 20 MEQ PO TBCR
40.0000 meq | EXTENDED_RELEASE_TABLET | Freq: Once | ORAL | Status: AC
  Administered 2019-01-31: 40 meq via ORAL
  Filled 2019-01-31: qty 2

## 2019-01-31 NOTE — ED Triage Notes (Signed)
Patient presents to the ED after being brought in by her brother. Patient's brother reports she was recently d/c from [facility]. Patient is not speaking to RN appropriately and only making gestures as if playing charades. Patient denies SI/HI but is fixating on objects such as the clock and repeating gestures and pointing to the ceiling with her hand and acting as if she is hanging her hand.  Patient's brother reports patient has become increasingly agitated and was throwing books and other objects at him as she got more upset.
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
## 2019-01-31 NOTE — ED Provider Notes (Signed)
[place] DEPT Provider Note   CSN: [id number] Arrival date & time: 01/31/19  0756     History   Chief Complaint Chief Complaint  Patient presents with  . Medical Clearance  . Manic Behavior    HPI Theresa Martin is a 63 y.o. female history depression, anxiety, behavioral disorder, presenting to the emergency department with agitation behavioral changes.  Patient was seen and evaluated overnight at [facility] 4 days ago for worsening behavioral changes.  She presented at the behest of her family with concerns that she was behaving speaking erratically.  She was evaluated by one of the psychiatrist and cleared for outpatient psychiatry, with some minor adjustments made to her medication, increasing her daily Seroquel dose as noted below.   However, she now presents back to the ER in the company of her brother Juanda Crumble, who reports that her behavior has become even more bizarre and concerning.  He states that for the past 2 days she has had difficulty expressing herself.  She perseverates on thoughts, for example, telling him over and over that his shirt is below.  She also repeat herself saying things like "His name is Clair Gulling, J-I-M, Dub Mikes."  He says is unusual for her.  Until recently she was living independently.  He says when she is well, she has incredibly lucid.  She has normal conversations with her family, lives by herself in [place], and otherwise is able to take care of her self without any difficulties.  However he believes she is having a "episode".  He says in the past she has required hospitalization for 2 -3 weeks until she is well enough for discharge.  For psychiatric medications, her current regimen is: She is on seroquel now 200 mg at night, 100 mg in the AM Klonipin 0.5 mg at bedside Lamictal 100 mg daily Lexapro 10 mg daily  Her daughter helps her take her medications every day and her brother at bedside  reports he has not missed any medicines.  The patient herself exhibits very bizarre behavior during my exam.  She is unable to provide any kind of history.  She is continuously pointing, smiling, nodding, and answering my questions inappropriately.  She does deny having pain anywhere.  She denies a headache when I asked her.     HPI  Past Medical History:  Diagnosis Date  . Allergy   . Anxiety   . Depression   . Hyperlipidemia   . Hypertension   . Osteopenia after menopause   . Persistent headaches   . Pneumonia   . Postherpetic neuralgia   . Renal disorder   . Shingles outbreak 02/2014  . Skin cancer    moles on right groin and right buttock.    Patient Active Problem List   Diagnosis Date Noted  . Abnormal chest CT 12/11/2018  . Left sided numbness 10/22/2018  . Nonallopathic lesion of thoracic region 09/05/2018  . Nonallopathic lesion of rib cage 09/05/2018  . Nonallopathic lesion of cervical region 09/05/2018  . Nonallopathic lesion of lumbosacral region 09/05/2018  . Nonallopathic lesion of sacral region 09/05/2018  . Skin cancer   . Osteopenia after menopause   . Chronic right shoulder pain 03/30/2017  . Right shoulder pain 01/12/2017  . Tendinopathy of rotator cuff 12/20/2016  . Angle-closure glaucoma, severe stage 08/19/2015  . Migraine without aura and without status migrainosus, not intractable 01/13/2015  . High risk medications (not anticoagulants) long-term use 09/26/2014  . Routine  general medical examination at a health care facility 07/27/2012  . Hyperlipidemia 09/29/2010  . URETEROPELVIC JUNCTION OBSTRUCTION, CONGENITAL 12/15/2009  . CONSTIPATION 12/11/2009  . CARDIAC MURMUR 11/26/2008  . MAGNETIC RESONANCE IMAGING, BRAIN, ABNORMAL 10/29/2008  . VENEREAL WART 10/23/2008  . Major depressive disorder, recurrent episode ([place]) 10/23/2008  . Essential hypertension 10/23/2008  . ALLERGIC RHINITIS 10/23/2008  . ASTHMA, INTERMITTENT, MILD 10/23/2008  .  RENAL CALCULUS, HX OF 10/23/2008    Past Surgical History:  Procedure Laterality Date  . CARDIOVASCULAR STRESS TEST  02/2009   treadmill stress test: Low risk  . [place]  . COLONOSCOPY     2014/2015 [place], normal  . EXPLORATORY LAPAROTOMY     laproscopy for infertility  . EYE SURGERY Bilateral    laser-correct opening b/tn cornea and iris  . MOLE REMOVAL  2017   x 2 moles     OB History    Gravida  3   Para  2   Term  2   Preterm      AB  1   Living  2     SAB  1   TAB      Ectopic      Multiple      Live Births  2        Obstetric Comments  1st Menstrual Cycle:  11 1st Pregnancy:  29          Home Medications    Prior to Admission medications   Medication Sig Start Date End Date Taking? Authorizing Provider  albuterol (PROVENTIL HFA) 108 (90 Base) MCG/ACT inhaler Inhale into the lungs every 6 (six) hours as needed for wheezing or shortness of breath.    [provider]  aspirin EC 81 MG tablet Take 81 mg by mouth daily.    [provider]  atorvastatin (LIPITOR) 20 MG tablet TAKE 1 TABLET BY MOUTH EVERY DAY 11/09/18   [name], MD  beclomethasone (QVAR) 80 MCG/ACT inhaler Inhale 1-2 puffs into the lungs 2 (two) times daily. 01/11/19   Flora Lipps, MD  Calcium-Vitamin D-Vitamin K (CALCIUM + D + K PO) Take 3 each by mouth daily. Calcium 516m- Vitamin D 1000iu- K 462m    [provider]  clonazePAM (KLONOPIN) 0.5 MG tablet Take 0.25 mg by mouth at bedtime as needed.  01/12/14   [provider]  ELDERBERRY PO Take by mouth.    [provider]  escitalopram (LEXAPRO) 10 MG tablet Take 10 mg by mouth daily.     [provider]  gabapentin (NEURONTIN) 100 MG capsule Take 1-3 capsules (100-300 mg total) by mouth 2 (two) times daily. Patient taking differently: Take 100-300 mg by mouth 2 (two) times daily. 100 in the morning and 200 at night. 01/30/18   Penumalli,  ViEarlean PolkaMD  Glucosamine-Chondroitin (MOVE FREE PO) Take 1 tablet by mouth daily.    [provider]  Ketoconazole-Hydrocortisone 2 & 1 % KIT Apply topically as needed. Cracks on mouth    [provider]  lamoTRIgine (LAMICTAL) 100 MG tablet Take 100 mg by mouth daily.     [provider]  Melatonin 5 MG CAPS Take 1 capsule (5 mg total) by mouth at bedtime. 01/28/19 02/27/19  FuVanessa DurhamMD  montelukast (SINGULAIR) 10 MG tablet Take 1 tablet (10 mg total) by mouth at bedtime. 09/27/18   MaBrunetta JeansPA-C  Naproxen Sodium (ALEVE) 220 MG CAPS As needed for  headache 04/15/15   Penumalli, Earlean Polka, MD  QUEtiapine (SEROQUEL) 100 MG tablet Take 100 mg by mouth 3 (three) times daily. Pt takes 1 tablet in the AM and 2 tablets in the PM    [provider]  triamterene-hydrochlorothiazide (MAXZIDE-25) 37.5-25 MG tablet TAKE 1 TABLET BY MOUTH EVERY DAY 10/04/18   [name], MD    Family History Family History  Problem Relation Age of Onset  . Hypertension Mother   . Coronary artery disease Mother   . Heart failure Mother   . Hypertension Father   . Liver disease Sister        liver failure  . Arrhythmia Brother   . Hypertension Brother   . Cancer Neg Hx        colon   . Breast cancer Neg Hx     Social History Social History   Tobacco Use  . Smoking status: Never Smoker  . Smokeless tobacco: Never Used  Substance Use Topics  . Alcohol use: No    Alcohol/week: 0.0 standard drinks  . Drug use: No     Allergies   Patient has no known allergies.   Review of Systems Review of Systems  Unable to perform ROS: Psychiatric disorder (Level 5 caveat)     Physical Exam Updated Vital Signs BP (!) 179/95 (BP Location: Left Arm) Comment: Pt would not keep arm still  Pulse 79   Temp 98.7 F (37.1 C) (Oral)   Resp 20   SpO2 95%   Physical Exam Vitals signs and nursing note reviewed.  Constitutional:      General: She is not in acute  distress.    Appearance: She is well-developed.     Comments: Smiling, pointing up at the ceiling, then past me Intermittently saying random things like "I'm Inez Catalina, I'm great."  HENT:     Head: Normocephalic and atraumatic.  Eyes:     Conjunctiva/sclera: Conjunctivae normal.  Neck:     Musculoskeletal: Neck supple.  Cardiovascular:     Rate and Rhythm: Normal rate and regular rhythm.     Pulses: Normal pulses.  Pulmonary:     Effort: Pulmonary effort is normal. No respiratory distress.  Abdominal:     Palpations: Abdomen is soft.     Tenderness: There is no abdominal tenderness.  Skin:    General: Skin is warm and dry.  Neurological:     Mental Status: She is alert.      ED Treatments / Results  Labs (all labs ordered are listed, but only abnormal results are displayed) Labs Reviewed  CBC - Abnormal; Notable for the following components:      Result Value   RDW 11.2 (*)    All other components within normal limits  COMPREHENSIVE METABOLIC PANEL - Abnormal; Notable for the following components:   Sodium 133 (*)    Potassium 3.0 (*)    Chloride 96 (*)    BUN 7 (*)    All other components within normal limits  ACETAMINOPHEN LEVEL - Abnormal; Notable for the following components:   Acetaminophen (Tylenol), Serum <10 (*)    All other components within normal limits  ETHANOL  SALICYLATE LEVEL  RAPID URINE DRUG SCREEN, HOSP PERFORMED  URINALYSIS, ROUTINE W REFLEX MICROSCOPIC    EKG None  Radiology No results found.  Procedures Procedures (including critical care time)  Medications Ordered in ED Medications  ziprasidone (GEODON) injection 20 mg (20 mg Intramuscular Given 01/31/19 1341)    And  LORazepam (ATIVAN) tablet 1 mg (1 mg Oral Given 01/31/19 1209)  potassium chloride SA (KLOR-CON) CR tablet 40 mEq (40 mEq Oral Given 01/31/19 1027)  sterile water (preservative free) injection (  Given 01/31/19 1344)     Initial Impression / Assessment and Plan / ED  Course  I have reviewed the triage vital signs and the nursing notes.  Pertinent labs & imaging results that were available during my care of the patient were reviewed by me and considered in my medical decision making (see chart for details).  63 year old female presenting to the emergency department worsening behavioral changes over the past several days.  Her brother Juanda Crumble present at bedside and states that he is at his wit's end trying to take care of the patient.  She is normally completely independent and functional, but over the past week or 2 has been having progressively worsening behavioral changes.  He is concerned for her safety.  He states her speech is bizarre and unlike her self.  The patient had a full medical work-up at [facility] 3 to 4 days ago, and was evaluated by their psychiatrist and recommended for outpatient psychiatry with minor adjustments to her Seroquel dosing.  However her brother states that the patient can barely take care of herself, and is requiring constant monitoring.  He states there is been an acute change in her behavior and he really believes she is having a psychiatric episode.  My exam the patient is in no acute distress.  Her vital signs are normal.  A very low suspicion for infection or intracranial hemorrhage or bleed.  Although she had a recent negative screening, given the several days of past, we will repeat her tox and metabolic work-up and check her urine for signs of infection.  I will also place her behavioral health for reevaluation by her team; IVC if necessary; the patient does not demonstrate decision making capacity on my exam.   Final Clinical Impressions(s) / ED Diagnoses   Final diagnoses:  Manic behavior [facility])    ED Discharge Orders    None       Wyvonnia Dusky, MD 01/31/19 [phone]

## 2019-01-31 NOTE — BH Assessment (Signed)
Patient has been accepted to [facility] BMU - Pt's bed is pending COVID lab results

## 2019-01-31 NOTE — ED Notes (Signed)
Pt continues to yelling out, restless. difficult to redirect.

## 2019-01-31 NOTE — ED Notes (Signed)
Pt room 27. Pt continues to make bizarre gestures, arms moving , pointing. Pt not communicating.

## 2019-01-31 NOTE — BH Assessment (Addendum)
Per Weldon Picking [facility] staff), patient accepted to [facility] adult unit pending a bed assignment.

## 2019-01-31 NOTE — ED Notes (Signed)
Trifan MD at bedside 

## 2019-01-31 NOTE — Progress Notes (Signed)
Received Theresa Martin in her room at the change of shift with the sitter at the bedside. She completed her dinner and resting in the bed. She was cooperative with the Covid test.She verbalized having a lack of sleep prior to this hospitalization.

## 2019-01-31 NOTE — ED Notes (Signed)
Pt restless, calling out, flight of ideas, disorganized, difficult to redirect, nonsensical, pt can not complete a sentence or a task.

## 2019-02-01 ENCOUNTER — Other Ambulatory Visit: Payer: Self-pay

## 2019-02-01 ENCOUNTER — Inpatient Hospital Stay
Admission: RE | Admit: 2019-02-01 | Discharge: 2019-02-15 | DRG: 885 | Disposition: A | Discharge status: Home / Self Care | Payer: Medicare Other | Source: Intra-hospital | Attending: Psychiatry | Admitting: Psychiatry

## 2019-02-01 DIAGNOSIS — M797 Fibromyalgia: Secondary | ICD-10-CM | POA: Diagnosis present

## 2019-02-01 DIAGNOSIS — G4700 Insomnia, unspecified: Secondary | ICD-10-CM | POA: Diagnosis present

## 2019-02-01 DIAGNOSIS — F259 Schizoaffective disorder, unspecified: Secondary | ICD-10-CM | POA: Diagnosis present

## 2019-02-01 DIAGNOSIS — K219 Gastro-esophageal reflux disease without esophagitis: Secondary | ICD-10-CM | POA: Diagnosis present

## 2019-02-01 DIAGNOSIS — Z20828 Contact with and (suspected) exposure to other viral communicable diseases: Secondary | ICD-10-CM | POA: Diagnosis present

## 2019-02-01 DIAGNOSIS — Z79899 Other long term (current) drug therapy: Secondary | ICD-10-CM | POA: Diagnosis not present

## 2019-02-01 DIAGNOSIS — I10 Essential (primary) hypertension: Secondary | ICD-10-CM | POA: Diagnosis present

## 2019-02-01 DIAGNOSIS — G43909 Migraine, unspecified, not intractable, without status migrainosus: Secondary | ICD-10-CM | POA: Diagnosis present

## 2019-02-01 DIAGNOSIS — Z7982 Long term (current) use of aspirin: Secondary | ICD-10-CM | POA: Diagnosis not present

## 2019-02-01 DIAGNOSIS — E785 Hyperlipidemia, unspecified: Secondary | ICD-10-CM | POA: Diagnosis present

## 2019-02-01 DIAGNOSIS — J449 Chronic obstructive pulmonary disease, unspecified: Secondary | ICD-10-CM | POA: Diagnosis present

## 2019-02-01 DIAGNOSIS — J45909 Unspecified asthma, uncomplicated: Secondary | ICD-10-CM | POA: Diagnosis not present

## 2019-02-01 DIAGNOSIS — Z85828 Personal history of other malignant neoplasm of skin: Secondary | ICD-10-CM

## 2019-02-01 DIAGNOSIS — I959 Hypotension, unspecified: Secondary | ICD-10-CM | POA: Diagnosis not present

## 2019-02-01 DIAGNOSIS — F3113 Bipolar disorder, current episode manic without psychotic features, severe: Secondary | ICD-10-CM | POA: Diagnosis not present

## 2019-02-01 DIAGNOSIS — J4520 Mild intermittent asthma, uncomplicated: Secondary | ICD-10-CM

## 2019-02-01 DIAGNOSIS — G2581 Restless legs syndrome: Secondary | ICD-10-CM | POA: Diagnosis present

## 2019-02-01 LAB — SARS CORONAVIRUS 2 (TAT 6-24 HRS): SARS Coronavirus 2: NEGATIVE

## 2019-02-01 MED ORDER — MONTELUKAST SODIUM 10 MG PO TABS
10.0000 mg | ORAL_TABLET | Freq: Every day | ORAL | Status: DC
  Administered 2019-02-01 – 2019-02-14 (×14): 10 mg via ORAL
  Filled 2019-02-01 (×14): qty 1

## 2019-02-01 MED ORDER — LORAZEPAM 2 MG/ML IJ SOLN
1.0000 mg | Freq: Four times a day (QID) | INTRAMUSCULAR | Status: DC | PRN
  Administered 2019-02-04 – 2019-02-05 (×2): 1 mg via INTRAMUSCULAR
  Filled 2019-02-01 (×2): qty 1

## 2019-02-01 MED ORDER — GABAPENTIN 100 MG PO CAPS
200.0000 mg | ORAL_CAPSULE | Freq: Two times a day (BID) | ORAL | Status: DC
  Administered 2019-02-02 – 2019-02-05 (×7): 200 mg via ORAL
  Filled 2019-02-01 (×8): qty 2

## 2019-02-01 MED ORDER — ACETAMINOPHEN 325 MG PO TABS
650.0000 mg | ORAL_TABLET | Freq: Four times a day (QID) | ORAL | Status: DC | PRN
  Administered 2019-02-01 – 2019-02-15 (×10): 650 mg via ORAL
  Filled 2019-02-01 (×10): qty 2

## 2019-02-01 MED ORDER — QUETIAPINE FUMARATE 25 MG PO TABS
25.0000 mg | ORAL_TABLET | Freq: Two times a day (BID) | ORAL | Status: DC
  Filled 2019-02-01: qty 1

## 2019-02-01 MED ORDER — GABAPENTIN 100 MG PO CAPS: 100.0000 mg | ORAL_CAPSULE | Freq: Two times a day (BID) | ORAL | Status: DC

## 2019-02-01 MED ORDER — ALUM & MAG HYDROXIDE-SIMETH 200-200-20 MG/5ML PO SUSP: 30.0000 mL | ORAL | Status: DC | PRN

## 2019-02-01 MED ORDER — ALBUTEROL SULFATE HFA 108 (90 BASE) MCG/ACT IN AERS
2.0000 | INHALATION_SPRAY | Freq: Four times a day (QID) | RESPIRATORY_TRACT | Status: DC | PRN
  Administered 2019-02-03 – 2019-02-15 (×7): 2 via RESPIRATORY_TRACT
  Filled 2019-02-01 (×2): qty 6.7

## 2019-02-01 MED ORDER — CALCIUM CARBONATE-VITAMIN D 500-200 MG-UNIT PO TABS
1.0000 | ORAL_TABLET | Freq: Every day | ORAL | Status: DC
  Administered 2019-02-02 – 2019-02-14 (×13): 1 via ORAL
  Filled 2019-02-01 (×14): qty 1

## 2019-02-01 MED ORDER — QUETIAPINE FUMARATE 25 MG PO TABS
50.0000 mg | ORAL_TABLET | Freq: Two times a day (BID) | ORAL | Status: DC
  Administered 2019-02-02 – 2019-02-08 (×13): 50 mg via ORAL
  Filled 2019-02-01 (×14): qty 2

## 2019-02-01 MED ORDER — MELATONIN 5 MG PO TABS
5.0000 mg | ORAL_TABLET | Freq: Every day | ORAL | Status: DC
  Administered 2019-02-01 – 2019-02-14 (×14): 5 mg via ORAL
  Filled 2019-02-01 (×15): qty 1

## 2019-02-01 MED ORDER — ACETAMINOPHEN 325 MG PO TABS
650.0000 mg | ORAL_TABLET | Freq: Four times a day (QID) | ORAL | Status: DC | PRN
  Administered 2019-02-01: 10:00:00 650 mg via ORAL
  Filled 2019-02-01 (×2): qty 2

## 2019-02-01 MED ORDER — MAGNESIUM HYDROXIDE 400 MG/5ML PO SUSP: 30.0000 mL | Freq: Every day | ORAL | Status: DC | PRN

## 2019-02-01 MED ORDER — ASPIRIN EC 81 MG PO TBEC
81.0000 mg | DELAYED_RELEASE_TABLET | Freq: Every day | ORAL | Status: DC
  Administered 2019-02-02 – 2019-02-15 (×14): 81 mg via ORAL
  Filled 2019-02-01 (×16): qty 1

## 2019-02-01 MED ORDER — CALCIUM CARBONATE-VITAMIN D 500-200 MG-UNIT PO TABS
ORAL_TABLET | Freq: Every day | ORAL | Status: DC
  Filled 2019-02-01: qty 1

## 2019-02-01 MED ORDER — ATORVASTATIN CALCIUM 20 MG PO TABS
20.0000 mg | ORAL_TABLET | Freq: Every day | ORAL | Status: DC
  Administered 2019-02-02 – 2019-02-15 (×14): 20 mg via ORAL
  Filled 2019-02-01 (×16): qty 1

## 2019-02-01 MED ORDER — LORAZEPAM 0.5 MG PO TABS
0.5000 mg | ORAL_TABLET | Freq: Four times a day (QID) | ORAL | Status: DC | PRN
  Administered 2019-02-01 – 2019-02-13 (×8): 0.5 mg via ORAL
  Filled 2019-02-01 (×8): qty 1

## 2019-02-01 MED ORDER — TRIAMTERENE-HCTZ 37.5-25 MG PO TABS
1.0000 | ORAL_TABLET | Freq: Every day | ORAL | Status: DC
  Administered 2019-02-02 – 2019-02-15 (×14): 1 via ORAL
  Filled 2019-02-01 (×15): qty 1

## 2019-02-01 MED ORDER — ESCITALOPRAM OXALATE 10 MG PO TABS
10.0000 mg | ORAL_TABLET | Freq: Every day | ORAL | Status: DC
  Administered 2019-02-02 – 2019-02-03 (×2): 10 mg via ORAL
  Filled 2019-02-01 (×3): qty 1

## 2019-02-01 MED ORDER — ACETAMINOPHEN 500 MG PO TABS
1000.0000 mg | ORAL_TABLET | Freq: Once | ORAL | Status: AC
  Administered 2019-02-01: 07:00:00 1000 mg via ORAL
  Filled 2019-02-01: qty 2

## 2019-02-01 MED ORDER — STERILE WATER FOR INJECTION IJ SOLN
INTRAMUSCULAR | Status: AC
  Administered 2019-02-01: 11:00:00 10 mL
  Filled 2019-02-01: qty 10

## 2019-02-01 MED ORDER — ZIPRASIDONE MESYLATE 20 MG IM SOLR
20.0000 mg | Freq: Two times a day (BID) | INTRAMUSCULAR | Status: DC | PRN
  Administered 2019-02-01: 11:00:00 20 mg via INTRAMUSCULAR
  Filled 2019-02-01: qty 20

## 2019-02-01 MED ORDER — QUETIAPINE FUMARATE 200 MG PO TABS
200.0000 mg | ORAL_TABLET | Freq: Every day | ORAL | Status: DC
  Administered 2019-02-01: 20:00:00 200 mg via ORAL
  Filled 2019-02-01: qty 1

## 2019-02-01 MED ORDER — LORAZEPAM 1 MG PO TABS
1.0000 mg | ORAL_TABLET | ORAL | Status: AC | PRN
  Administered 2019-02-01: 13:00:00 1 mg via ORAL
  Filled 2019-02-01: qty 1

## 2019-02-01 NOTE — BH Assessment (Signed)
[facility] Assessment Progress Note  Per Letitia Libra, FNP, this pt requires psychiatric hospitalization.  Ian Malkin, Counselor reports that pt has been accepted to [facility] by Dr Lala Lund to Rm 311.  They will be ready to receive pt after 13:00.  Pt presents under IVC initiated by EDP Octaviano Glow, MD, and IVC documents have been faxed to [phone].  Pt's nurse, Eustaquio Maize, has been notified, and agrees to call report to [phone].  Pt is to be transported via [facility].   Jalene Mullet, [place] Coordinator [phone]

## 2019-02-01 NOTE — ED Notes (Signed)
Pt off unit to [facility]. Pt alert, cooperative, no s/s of distress. DC information given to sheriff for facility.  Belongings give to sheriff for facility. Pt ambulatory off unit, escorted and transported by sheriff.

## 2019-02-01 NOTE — BHH Group Notes (Signed)
Balance In Life 02/01/2019 1PM  Type of Therapy/Topic:  Group Therapy:  Balance in Life  Participation Level:  Did Not Attend  Description of Group:   This group will address the concept of balance and how it feels and looks when one is unbalanced. Patients will be encouraged to process areas in their lives that are out of balance and identify reasons for remaining unbalanced. Facilitators will guide patients in utilizing problem-solving interventions to address and correct the stressor making their life unbalanced. Understanding and applying boundaries will be explored and addressed for obtaining and maintaining a balanced life. Patients will be encouraged to explore ways to assertively make their unbalanced needs known to significant others in their lives, using other group members and facilitator for support and feedback.  Therapeutic Goals: 1. Patient will identify two or more emotions or situations they have that consume much of in their lives. 2. Patient will identify signs/triggers that life has become out of balance:  3. Patient will identify two ways to set boundaries in order to achieve balance in their lives:  4. Patient will demonstrate ability to communicate their needs through discussion and/or role plays  Summary of Patient Progress:    Therapeutic Modalities:   Cognitive Behavioral Therapy Solution-Focused Therapy Assertiveness Training  Lonnette Shrode Lynelle Smoke, LCSW

## 2019-02-01 NOTE — Progress Notes (Signed)
This Probation officer has been trying to administer patient's scheduled medication, and she has been sitting in the chair outside of the medication room shaking like a leaf. Patient is shaking her head "no" when this writer asks her to take her medication, stating "I usually take it at nine".

## 2019-02-01 NOTE — ED Notes (Signed)
Called [facility] to inform intake that pt has Neg COVID. They are going to call back with bed assignment.

## 2019-02-01 NOTE — BH Assessment (Signed)
Patient has been accepted to [facility].  Accepting physician is Dr. Isidoro Donning.  Attending Physician will be Dr. Weber Cooks.  Patient has been assigned to room 311, by [place] Charge Nurse Demetria.  Call report to [phone].  Representative/Transfer Coordinator is Gilda Abboud Patient pre-admitted by [facility] Patient Access Gust Rung.)  [facility] ER Staff Eustaquio Maize, W., RN) made aware of acceptance.  Patient bed will be available after 1:00pm

## 2019-02-01 NOTE — Progress Notes (Signed)
Admission Note:   63 year old female, admitted to the unit, who presents IVC in no acute distress for the treatment of Depression and manic behaviors. Patient appears flat and depressed. Patient was calm and cooperative with admission process. Patient endorsed depression and anxiety stating that she doesn't know where she is and she worries about everything, which is making her feel this way. Patient denies SI/HI/AVH. Patient has a past medical history of Kidney disease, Anxiety, Pneumonia, Cancer, and eye surgery. Patient says she is on disability and lives alone. Skin was assessed with Cara, MHT and found to be clear of any abnormal marks. Patient searched and no contraband found and unit policies explained and understanding verbalized. Consents obtained. Food and fluids offered, and fluids accepted. Patient had no additional questions or concerns to voice to this Probation officer.

## 2019-02-01 NOTE — Tx Team (Signed)
Initial Treatment Plan 02/01/2019 7:26 PM Theresa Martin [id number]    PATIENT STRESSORS: Marital or family conflict Medication change or noncompliance   PATIENT STRENGTHS: Communication skills Supportive family/friends   PATIENT IDENTIFIED PROBLEMS: Depression  Anxiety  Confusion                 DISCHARGE CRITERIA:  Ability to meet basic life and health needs Improved stabilization in mood, thinking, and/or behavior Need for constant or close observation no longer present  PRELIMINARY DISCHARGE PLAN: Outpatient therapy Return to previous living arrangement  PATIENT/FAMILY INVOLVEMENT: This treatment plan has been presented to and reviewed with the patient, Theresa Martin. The patient has been given the opportunity to ask questions and make suggestions.  Theresa Vieyra, RN 02/01/2019, 7:26 PM

## 2019-02-01 NOTE — Progress Notes (Signed)
This writer attempted to administer medications to patient once her brother showed up to visit her just in case she were to feel more comfortable with him around, however, she continued to refuse to take it.

## 2019-02-01 NOTE — Plan of Care (Signed)
New admission.   Problem: Education: Goal: Knowledge of [place] [facility] Education information/materials will improve Outcome: Not Progressing Goal: Emotional status will improve Outcome: Not Progressing Goal: Mental status will improve Outcome: Not Progressing Goal: Verbalization of understanding the information provided will improve Outcome: Not Progressing   Problem: Safety: Goal: Periods of time without injury will increase Outcome: Not Progressing   Problem: Education: Goal: Utilization of techniques to improve thought processes will improve Outcome: Not Progressing   Problem: Coping: Goal: Coping ability will improve Outcome: Not Progressing Goal: Will verbalize feelings Outcome: Not Progressing   Problem: Coping: Goal: Ability to identify and develop effective coping behavior will improve Outcome: Not Progressing
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
## 2019-02-01 NOTE — ED Notes (Signed)
Pt to room 36. Pt oriented to unit. Pt alert, calm, cooperative, no s/s of distress.  Treatment plan discussed.

## 2019-02-02 DIAGNOSIS — F259 Schizoaffective disorder, unspecified: Principal | ICD-10-CM

## 2019-02-02 LAB — HEPATIC FUNCTION PANEL
ALT: 25 U/L (ref 0–44)
AST: 38 U/L (ref 15–41)
Albumin: 4 g/dL (ref 3.5–5.0)
Alkaline Phosphatase: 76 U/L (ref 38–126)
Bilirubin, Direct: <0.1 mg/dL (ref 0.0–0.2)
Total Bilirubin: 0.7 mg/dL (ref 0.3–1.2)
Total Protein: 7.4 g/dL (ref 6.5–8.1)

## 2019-02-02 LAB — LIPID PANEL
Cholesterol: 158 mg/dL (ref 0–200)
HDL: 62 mg/dL (ref >40)
LDL Cholesterol: 86 mg/dL (ref 0–99)
Total CHOL/HDL Ratio: 2.5 RATIO
Triglycerides: 49 mg/dL (ref <150)
VLDL: 10 mg/dL (ref 0–40)

## 2019-02-02 LAB — TSH: TSH: 1.244 u[IU]/mL (ref 0.350–4.500)

## 2019-02-02 MED ORDER — QUETIAPINE FUMARATE 100 MG PO TABS
300.0000 mg | ORAL_TABLET | Freq: Every day | ORAL | Status: DC
  Administered 2019-02-02 – 2019-02-04 (×3): 300 mg via ORAL
  Filled 2019-02-02 (×3): qty 3

## 2019-02-02 MED ORDER — CLONAZEPAM 0.5 MG PO TABS
0.2500 mg | ORAL_TABLET | Freq: Two times a day (BID) | ORAL | Status: DC
  Administered 2019-02-02 – 2019-02-04 (×4): 0.25 mg via ORAL
  Filled 2019-02-02 (×4): qty 1

## 2019-02-02 NOTE — [facility] Group Notes (Signed)
LCSW Group Therapy Note  02/02/2019 11:07 AM  Type of Therapy and Topic:  Group Therapy:  Feelings around Relapse and Recovery  Participation Level:  Did Not Attend   Description of Group:    Patients in this group will discuss emotions they experience before and after a relapse. They will process how experiencing these feelings, or avoidance of experiencing them, relates to having a relapse. Facilitator will guide patients to explore emotions they have related to recovery. Patients will be encouraged to process which emotions are more powerful. They will be guided to discuss the emotional reaction significant others in their lives may have to their relapse or recovery. Patients will be assisted in exploring ways to respond to the emotions of others without this contributing to a relapse.  Therapeutic Goals: 1. Patient will identify two or more emotions that lead to a relapse for them 2. Patient will identify two emotions that result when they relapse 3. Patient will identify two emotions related to recovery 4. Patient will demonstrate ability to communicate their needs through discussion and/or role plays   Summary of Patient Progress: x    Therapeutic Modalities:   Cognitive Behavioral Therapy Solution-Focused Therapy Assertiveness Training Relapse Prevention Therapy   Evalina Field, MSW, LCSW Clinical Social Work 02/02/2019 11:07 AM

## 2019-02-02 NOTE — Progress Notes (Signed)
Recreation Therapy Notes  INPATIENT RECREATION THERAPY ASSESSMENT  Patient Details Name: Theresa Martin MRN: [id number] DOB: 30-Jan-1956 Today's Date: 02/02/2019       Information Obtained From: Patient  Able to Participate in Assessment/Interview: Yes  Patient Presentation: Responsive  Reason for Admission (Per Patient): Active Symptoms  Patient Stressors:    Coping Skills:   Exercise  Leisure Interests (2+):  Exercise - Walking, Exercise - Running, Social - Family, Social - Friends, Therapist, music - Camping  Frequency of Recreation/Participation: Monthly  Awareness of Community Resources:  Yes  Community Resources:  Park, Engineer, building services  Current Use:    If no, Barriers?:    Expressed Interest in [place] of Residence:  Insurance underwriter  Patient Main Form of Transportation:    Patient Strengths:  Nice, caring  Patient Identified Areas of Improvement:  Figuring out what is going on  Patient Goal for Hospitalization:  Getting better  Current SI (including self-harm):  No  Current HI:  No  Current AVH: No  Staff Intervention Plan: Group Attendance, Collaborate with Interdisciplinary Treatment Team  Consent to Intern Participation: N/A  Million Maharaj 02/02/2019, 2:42 PM

## 2019-02-02 NOTE — Plan of Care (Signed)
  Problem: Education: Goal: Knowledge of [place] [facility] Education information/materials will improve Outcome: Progressing Goal: Emotional status will improve Outcome: Progressing Goal: Mental status will improve Outcome: Progressing   Problem: Safety: Goal: Periods of time without injury will increase Outcome: Progressing   Problem: Coping: Goal: Ability to identify and develop effective coping behavior will improve Outcome: Progressing   Problem: Education: Goal: Verbalization of understanding the information provided will improve Outcome: Not Progressing   Problem: Coping: Goal: Will verbalize feelings Outcome: Not Progressing

## 2019-02-02 NOTE — Progress Notes (Signed)
[facility] MD Progress Note  02/02/2019 3:33 PM Theresa Martin  MRN:  [id number] Subjective: Follow-up note for this patient with schizoaffective disorder versus mixed bipolar episode.  Patient seen chart reviewed.  With patient's consent I also spoke with her brother, [name], on the telephone.  He was able to give a pretty clear history of the events over the last week or so.  Patient had been decompensating with more and more psychotic symptoms intermittently agitated behavior mood swings confusion and inability to care for self.  It appears that this is related at least in part to recent changes in her psychiatric medicine.  Sounds like she used to take higher doses of Seroquel which had been gradually tapered.  On interview today and the patient was fairly disorganized.  She was complaining of having "a total panic attack" and used those words several times in a row.  She is tremulous although she is exaggerating a bit.  Asked me multiple times for her Qvar inhaler even though she does not actually appear to be in any respiratory distress or to be wheezing.  I was gradually I think able to reassure her that we would help her with her anxiety and get her to feel better.  She is not reporting suicidal thoughts has not been aggressive but at times has seemed confused and been wandering around and needed some redirection. Principal Problem: Schizo affective schizophrenia ([place]) Diagnosis: Principal Problem:   Schizo affective schizophrenia ([place])  Total Time spent with patient: 30 minutes  Past Psychiatric History: Long history of mental health problems.  Evidently her last hospitalization was about 8 years ago.  Unfortunately the details have been wiped from the computer records although we do at least have documentation that she had seen Dr. Geryl Councilman in the past.  Diagnoses in the past seem to have been more focused on bipolar disorder.  Apparently she had been doing well on Seroquel extended release for  an unknown period of time but had recently asked her psychiatrist to taper off of it.  I am not completely clear on what the time course on that was.  No known past suicide attempts.  Past Medical History:  Past Medical History:  Diagnosis Date  . Allergy   . Anxiety   . Depression   . Hyperlipidemia   . Hypertension   . Osteopenia after menopause   . Persistent headaches   . Pneumonia   . Postherpetic neuralgia   . Renal disorder   . Shingles outbreak 02/2014  . Skin cancer    moles on right groin and right buttock.    Past Surgical History:  Procedure Laterality Date  . CARDIOVASCULAR STRESS TEST  02/2009   treadmill stress test: Low risk  . [place]  . COLONOSCOPY     2014/2015 [place], normal  . EXPLORATORY LAPAROTOMY     laproscopy for infertility  . EYE SURGERY Bilateral    laser-correct opening b/tn cornea and iris  . MOLE REMOVAL  2017   x 2 moles   Family History:  Family History  Problem Relation Age of Onset  . Hypertension Mother   . Coronary artery disease Mother   . Heart failure Mother   . Hypertension Father   . Liver disease Sister        liver failure  . Arrhythmia Brother   . Hypertension Brother   . Cancer Neg Hx        colon   . Breast  cancer Neg Hx    Family Psychiatric  History: None reported Social History:  Social History   Substance and Sexual Activity  Alcohol Use No  . Alcohol/week: 0.0 standard drinks     Social History   Substance and Sexual Activity  Drug Use No    Social History   Socioeconomic History  . Marital status: Legally Separated    Spouse name: Not on file  . Number of children: 2  . Years of education: Not on file  . Highest education level: Not on file  Occupational History  . Occupation: Control and instrumentation engineer  Social Needs  . Financial resource strain: Not on file  . Food insecurity    Worry: Not on file    Inability: Not on file  . Transportation needs    Medical: Not on file     Non-medical: Not on file  Tobacco Use  . Smoking status: Never Smoker  . Smokeless tobacco: Never Used  Substance and Sexual Activity  . Alcohol use: No    Alcohol/week: 0.0 standard drinks  . Drug use: No  . Sexual activity: Not on file  Lifestyle  . Physical activity    Days per week: Not on file    Minutes per session: Not on file  . Stress: Not on file  Relationships  . Social Herbalist on phone: Not on file    Gets together: Not on file    Attends religious service: Not on file    Active member of club or organization: Not on file    Attends meetings of clubs or organizations: Not on file    Relationship status: Not on file  Other Topics Concern  . Not on file  Social History Narrative   Regular exercise- yes, spins daily   Diet: fruits and veggies, water    No caffeine   Additional Social History:    Pain Medications: see PTA Prescriptions: see PTA Over the Counter: see PTA                    Sleep: Fair  Appetite:  Fair  Current Medications: Current Facility-Administered Medications  Medication Dose Route Frequency Provider Last Rate Last Dose  . acetaminophen (TYLENOL) tablet 650 mg  650 mg Oral Q6H PRN Dixie Dials, MD   650 mg at 02/02/19 0958  . albuterol (VENTOLIN HFA) 108 (90 Base) MCG/ACT inhaler 2 puff  2 puff Inhalation Q6H PRN Money, Lowry Ram, FNP      . alum & mag hydroxide-simeth (MAALOX/MYLANTA) 200-200-20 MG/5ML suspension 30 mL  30 mL Oral Q4H PRN Cristofano, Dorene Ar, MD      . aspirin EC tablet 81 mg  81 mg Oral Daily Money, Lowry Ram, FNP   81 mg at 02/02/19 0813  . atorvastatin (LIPITOR) tablet 20 mg  20 mg Oral Daily Money, Lowry Ram, FNP   20 mg at 02/02/19 0813  . calcium-vitamin D (OSCAL WITH D) 500-200 MG-UNIT per tablet 1 tablet  1 tablet Oral Daily Money, Lowry Ram, FNP   1 tablet at 02/02/19 [phone]  . clonazePAM (KLONOPIN) tablet 0.25 mg  0.25 mg Oral BID Binnie Droessler T, MD      . escitalopram (LEXAPRO) tablet  10 mg  10 mg Oral Daily Money, Lowry Ram, FNP   10 mg at 02/02/19 0813  . gabapentin (NEURONTIN) capsule 200 mg  200 mg Oral BID Money, Lowry Ram, FNP   200 mg at 02/02/19 0813  .  LORazepam (ATIVAN) tablet 0.5 mg  0.5 mg Oral Q6H PRN Money, Lowry Ram, FNP   0.5 mg at 02/02/19 1304   Or  . LORazepam (ATIVAN) injection 1 mg  1 mg Intramuscular Q6H PRN Money, Darnelle Maffucci B, FNP      . magnesium hydroxide (MILK OF MAGNESIA) suspension 30 mL  30 mL Oral Daily PRN Cristofano, Dorene Ar, MD      . Melatonin TABS 5 mg  5 mg Oral QHS Money, Lowry Ram, FNP   5 mg at 02/01/19 2001  . montelukast (SINGULAIR) tablet 10 mg  10 mg Oral QHS Money, Lowry Ram, FNP   10 mg at 02/01/19 2001  . QUEtiapine (SEROQUEL) tablet 300 mg  300 mg Oral QHS Bahar Shelden T, MD      . QUEtiapine (SEROQUEL) tablet 50 mg  50 mg Oral BID Money, Lowry Ram, FNP   50 mg at 02/02/19 0814  . triamterene-hydrochlorothiazide (MAXZIDE-25) 37.5-25 MG per tablet 1 tablet  1 tablet Oral Daily Money, Lowry Ram, FNP   1 tablet at 02/02/19 [id number]    Lab Results:  Results for orders placed or performed during the hospital encounter of 02/01/19 (from the past 48 hour(s))  Hepatic function panel     Status: None   Collection Time: 02/02/19  8:10 AM  Result Value Ref Range   Total Protein 7.4 6.5 - 8.1 g/dL   Albumin 4.0 3.5 - 5.0 g/dL   AST 38 15 - 41 U/L   ALT 25 0 - 44 U/L   Alkaline Phosphatase 76 38 - 126 U/L   Total Bilirubin 0.7 0.3 - 1.2 mg/dL   Bilirubin, Direct <0.1 0.0 - 0.2 mg/dL   Indirect Bilirubin NOT CALCULATED 0.3 - 0.9 mg/dL    Comment: Performed at [facility], [address] [place], [place] [address]  Lipid panel     Status: None   Collection Time: 02/02/19  8:10 AM  Result Value Ref Range   Cholesterol 158 0 - 200 mg/dL   Triglycerides 49 <150 mg/dL   HDL 62 >40 mg/dL   Total CHOL/HDL Ratio 2.5 RATIO   VLDL 10 0 - 40 mg/dL   LDL Cholesterol 86 0 - 99 mg/dL    Comment:        Total Cholesterol/HDL:CHD Risk Coronary  Heart Disease Risk Table                     Men   Women  1/2 Average Risk   3.4   3.3  Average Risk       5.0   4.4  2 X Average Risk   9.6   7.1  3 X Average Risk  23.4   11.0        Use the calculated Patient Ratio above and the CHD Risk Table to determine the patient's CHD Risk.        ATP III CLASSIFICATION (LDL):  <100     mg/dL   Optimal  100-129  mg/dL   Near or Above                    Optimal  130-159  mg/dL   Borderline  160-189  mg/dL   High  >190     mg/dL   Very High Performed at [facility], [address] [place], [place] [address]   TSH     Status: None   Collection Time: 02/02/19  8:10 AM  Result  Value Ref Range   TSH 1.244 0.350 - 4.500 uIU/mL    Comment: Performed by a 3rd Generation assay with a functional sensitivity of <=0.01 uIU/mL. Performed at [facility], [address] [place], [place] [address]     Blood Alcohol level:  Lab Results  Component Value Date   [facility] <10 01/31/2019   ETH <10 [id number]    Metabolic Disorder Labs: Lab Results  Component Value Date   HGBA1C 5.3 09/30/2018   MPG 105.41 09/30/2018   No results found for: PROLACTIN Lab Results  Component Value Date   CHOL 158 02/02/2019   TRIG 49 02/02/2019   HDL 62 02/02/2019   CHOLHDL 2.5 02/02/2019   VLDL 10 02/02/2019   LDLCALC 86 02/02/2019   LDLCALC 87 09/30/2018    Physical Findings: AIMS:  , ,  ,  ,    CIWA:    COWS:     Musculoskeletal: Strength & Muscle Tone: within normal limits Gait & Station: normal Patient leans: N/A  Psychiatric Specialty Exam: Physical Exam  Nursing note and vitals reviewed. Constitutional: She appears well-developed and well-nourished.  HENT:  Head: Normocephalic and atraumatic.  Eyes: Pupils are equal, round, and reactive to light. Conjunctivae are normal.  Neck: Normal range of motion.  Cardiovascular: Regular rhythm and normal heart sounds.  Respiratory: Effort normal. No respiratory distress.  GI:  Soft.  Musculoskeletal: Normal range of motion.  Neurological: She is alert.  Skin: Skin is warm and dry.  Psychiatric: Her mood appears anxious. Her affect is labile. Her speech is rapid and/or pressured and tangential. She is agitated. She is not aggressive. Thought content is paranoid. Cognition and memory are impaired. She expresses impulsivity and inappropriate judgment. She expresses no homicidal and no suicidal ideation. She exhibits abnormal recent memory and abnormal remote memory.    Review of Systems  Constitutional: Negative.   HENT: Negative.   Eyes: Negative.   Respiratory: Negative.   Cardiovascular: Negative.   Gastrointestinal: Negative.   Musculoskeletal: Negative.   Skin: Negative.   Neurological: Negative.   Psychiatric/Behavioral: Positive for memory loss. Negative for depression, hallucinations, substance abuse and suicidal ideas. The patient is nervous/anxious and has insomnia.     Blood pressure (!) 172/77, pulse 84, temperature 98.7 F (37.1 C), temperature source Oral, resp. rate 18, height 5\' 1"  (1.549 m), weight 49.9 kg, SpO2 100 %.Body mass index is 20.78 kg/m.  General Appearance: Casual  Eye Contact:  Minimal  Speech:  Pressured  Volume:  Increased  Mood:  Anxious and Dysphoric  Affect:  Congruent and Labile  Thought Process:  Disorganized  Orientation:  Full (Time, Place, and Person)  Thought Content:  Illogical, Paranoid Ideation, Rumination and Tangential  Suicidal Thoughts:  No  Homicidal Thoughts:  No  Memory:  Immediate;   Fair Recent;   Poor Remote;   Fair  Judgement:  Impaired  Insight:  Shallow  Psychomotor Activity:  Restlessness  Concentration:  Concentration: Poor  Recall:  [employer] of Knowledge:  Fair  Language:  Fair  Akathisia:  No  Handed:  Right  AIMS (if indicated):     Assets:  Desire for Improvement Housing Physical Health Resilience Social Support  ADL's:  Intact  Cognition:  Impaired,  Mild  Sleep:  Number of  Hours: 6.25     Treatment Plan Summary: Daily contact with patient to assess and evaluate symptoms and progress in treatment, Medication management and Plan Patient still did not sleep well last night with  200 mg of Seroquel.  Tonight we will go up to 300 mg.  I have made sure that she has as needed medicines available as well for her acute anxiety.  Reassured the patient that we will get her feeling better.  I told her that if her brother brought in the inhaler I would allow her to use it but the hospital does not carry a Qvar inhaler.  She also as I mentioned does not really seem to be in any great respiratory distress and has an albuterol inhaler as needed if necessary.  Alethia Berthold, MD 02/02/2019, 3:33 PM

## 2019-02-02 NOTE — [facility] Suicide Risk Assessment (Signed)
Theresa Martin INPATIENT:  Family/Significant Other Suicide Prevention Education  Suicide Prevention Education:  Education Completed; Theresa Martin, Brother [phone] has been identified by the patient as the family member/significant other with whom the patient will be residing, and identified as the person(s) who will aid the patient in the event of a mental health crisis (suicidal ideations/suicide attempt).  With written consent from the patient, the family member/significant other has been provided the following suicide prevention education, prior to the and/or following the discharge of the patient.  The suicide prevention education provided includes the following:  Suicide risk factors  Suicide prevention and interventions  National Suicide Hotline telephone number  [facility] assessment telephone number  [facility] Emergency Assistance [place] and/or Residential Mobile Crisis Unit telephone number  Request made of family/significant other to:  Remove weapons (e.g., guns, rifles, knives), all items previously/currently identified as safety concern.    Remove drugs/medications (over-the-counter, prescriptions, illicit drugs), all items previously/currently identified as a safety concern.  The family member/significant other verbalizes understanding of the suicide prevention education information provided.  The family member/significant other agrees to remove the items of safety concern listed above.  CSW spoke with pts brother Theresa Martin, who reported that pt is in the hospital because she got her psychiatrist to taper her off of her seroquel and she started to go down hill. Per Theresa Martin, she wanted to cut back on her Seroquel because a side effect is gaining weight and pt did not want to gain weight and then pt started to go downhill and get worse. Theresa Martin expressed no concerns for SI/HI and denied pt having guns in her home. Theresa Martin expressed no concerns for pt  discharging depending on pts condition when she is discharged. Theresa Martin requested to speak with attending physician regarding pts condition.  [place] MSW LCSW 02/02/2019, 10:27 AM

## 2019-02-02 NOTE — Progress Notes (Signed)
Patient alert and oriented x 4, affect is flat, thoughts are organized and coherent, speech is soft, non pressured she appears anxious and restless in the dayroom during visitation with family member, she was offered emotional support and encouraged to use coping skills. Patient is receptive to staff, she came and got her evening medication, and after an hour she appeared less anxious, she was seen in the dayroom watching television and was appropriate. 15 minutes safety checks maintained will continue to monitor.

## 2019-02-02 NOTE — Progress Notes (Signed)
Patient presents alert with mild confusion. Constantly pacing floors. Reports slept poorly last night. Reports appetite fair, energy low and concentration poor. Reports Anxiety at 10, hopelessness at 6 and depression at 8. No tremors or shakes noted thus far. Reports pain to back and chest, tylenol given with relief.   Patient noted taking shower and washing hair. Medication compliant. Did not attend morning group. Encouragement and support offered. Safety checks maintained. Pt receptive and remains safe on unit with q 15 min checks.

## 2019-02-02 NOTE — Plan of Care (Signed)
  Problem: Coping: Goal: Ability to identify and develop effective coping behavior will improve Outcome: Progressing  Patient was able to use effective coping skill this evening when she was getting emotional and almost tearful.

## 2019-02-02 NOTE — Progress Notes (Signed)
Recreation Therapy Notes   Date: 02/02/2019  Time: 9:30 am   Location: Craft room   Behavioral response: N/A   Intervention Topic: Self-care  Discussion/Intervention: Patient did not attend group.   Clinical Observations/Feedback:  Patient did not attend group.   Kendrew Paci LRT/CTRS         Edric Fetterman 02/02/2019 11:44 AM
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
## 2019-02-03 MED ORDER — TEMAZEPAM 15 MG PO CAPS
30.0000 mg | ORAL_CAPSULE | Freq: Every day | ORAL | Status: DC
  Administered 2019-02-03 – 2019-02-08 (×6): 30 mg via ORAL
  Filled 2019-02-03 (×6): qty 2

## 2019-02-03 MED ORDER — TEMAZEPAM 15 MG PO CAPS: 15.0000 mg | ORAL_CAPSULE | Freq: Every day | ORAL | Status: DC

## 2019-02-03 NOTE — Progress Notes (Signed)
[facility] MD Progress Note  02/03/2019 1:33 PM Theresa Martin  MRN:  [id number] Subjective: Follow-up for this patient with schizoaffective disorder bipolar disorder.  Seen today the patient is not attending group.  She is very withdrawn but cooperative with the interview.  She tells me she is feeling absolutely exhausted.  She feels like she is overwhelmingly tired but cannot sleep.  She only slept about 3 hours last night and she is still jittery with her legs shaking today.  She denies having hallucinations.  She denies suicidal or homicidal ideation but says that she still feels confused often feels like she does not know where she is.  Her blood pressure still up a little bit.  So this bad sleep last night is even after greatly increasing the dose of her Seroquel. Principal Problem: Schizo affective schizophrenia ([place]) Diagnosis: Principal Problem:   Schizo affective schizophrenia ([place])  Total Time spent with patient: 30 minutes  Past Psychiatric History: Patient has a history of what sounds like schizoaffective or bipolar mood disorder with recent decompensation when Seroquel was discontinued  Past Medical History:  Past Medical History:  Diagnosis Date  . Allergy   . Anxiety   . Depression   . Hyperlipidemia   . Hypertension   . Osteopenia after menopause   . Persistent headaches   . Pneumonia   . Postherpetic neuralgia   . Renal disorder   . Shingles outbreak 02/2014  . Skin cancer    moles on right groin and right buttock.    Past Surgical History:  Procedure Laterality Date  . CARDIOVASCULAR STRESS TEST  02/2009   treadmill stress test: Low risk  . [place]  . COLONOSCOPY     2014/2015 [place], normal  . EXPLORATORY LAPAROTOMY     laproscopy for infertility  . EYE SURGERY Bilateral    laser-correct opening b/tn cornea and iris  . MOLE REMOVAL  2017   x 2 moles   Family History:  Family History  Problem Relation Age of Onset  . Hypertension  Mother   . Coronary artery disease Mother   . Heart failure Mother   . Hypertension Father   . Liver disease Sister        liver failure  . Arrhythmia Brother   . Hypertension Brother   . Cancer Neg Hx        colon   . Breast cancer Neg Hx    Family Psychiatric  History: See previous Social History:  Social History   Substance and Sexual Activity  Alcohol Use No  . Alcohol/week: 0.0 standard drinks     Social History   Substance and Sexual Activity  Drug Use No    Social History   Socioeconomic History  . Marital status: Legally Separated    Spouse name: Not on file  . Number of children: 2  . Years of education: Not on file  . Highest education level: Not on file  Occupational History  . Occupation: Control and instrumentation engineer  Social Needs  . Financial resource strain: Not on file  . Food insecurity    Worry: Not on file    Inability: Not on file  . Transportation needs    Medical: Not on file    Non-medical: Not on file  Tobacco Use  . Smoking status: Never Smoker  . Smokeless tobacco: Never Used  Substance and Sexual Activity  . Alcohol use: No    Alcohol/week: 0.0 standard drinks  . Drug use:  No  . Sexual activity: Not on file  Lifestyle  . Physical activity    Days per week: Not on file    Minutes per session: Not on file  . Stress: Not on file  Relationships  . Social Herbalist on phone: Not on file    Gets together: Not on file    Attends religious service: Not on file    Active member of club or organization: Not on file    Attends meetings of clubs or organizations: Not on file    Relationship status: Not on file  Other Topics Concern  . Not on file  Social History Narrative   Regular exercise- yes, spins daily   Diet: fruits and veggies, water    No caffeine   Additional Social History:    Pain Medications: see PTA Prescriptions: see PTA Over the Counter: see PTA                    Sleep: Poor  Appetite:   Fair  Current Medications: Current Facility-Administered Medications  Medication Dose Route Frequency Provider Last Rate Last Dose  . acetaminophen (TYLENOL) tablet 650 mg  650 mg Oral Q6H PRN Cristofano, Dorene Ar, MD   650 mg at 02/02/19 1721  . albuterol (VENTOLIN HFA) 108 (90 Base) MCG/ACT inhaler 2 puff  2 puff Inhalation Q6H PRN Money, Lowry Ram, FNP   2 puff at 02/03/19 1053  . alum & mag hydroxide-simeth (MAALOX/MYLANTA) 200-200-20 MG/5ML suspension 30 mL  30 mL Oral Q4H PRN Cristofano, Dorene Ar, MD      . aspirin EC tablet 81 mg  81 mg Oral Daily Money, Lowry Ram, FNP   81 mg at 02/03/19 0856  . atorvastatin (LIPITOR) tablet 20 mg  20 mg Oral Daily Money, Lowry Ram, FNP   20 mg at 02/03/19 0856  . calcium-vitamin D (OSCAL WITH D) 500-200 MG-UNIT per tablet 1 tablet  1 tablet Oral Daily Money, Lowry Ram, FNP   1 tablet at 02/03/19 0855  . clonazePAM (KLONOPIN) tablet 0.25 mg  0.25 mg Oral BID Yassmine Tamm, Madie Reno, MD   0.25 mg at 02/03/19 0856  . gabapentin (NEURONTIN) capsule 200 mg  200 mg Oral BID Money, Lowry Ram, FNP   200 mg at 02/03/19 0855  . LORazepam (ATIVAN) tablet 0.5 mg  0.5 mg Oral Q6H PRN Money, Lowry Ram, FNP   0.5 mg at 02/02/19 2032   Or  . LORazepam (ATIVAN) injection 1 mg  1 mg Intramuscular Q6H PRN Money, Darnelle Maffucci B, FNP      . magnesium hydroxide (MILK OF MAGNESIA) suspension 30 mL  30 mL Oral Daily PRN Cristofano, Dorene Ar, MD      . Melatonin TABS 5 mg  5 mg Oral QHS Money, Lowry Ram, FNP   5 mg at 02/02/19 2103  . montelukast (SINGULAIR) tablet 10 mg  10 mg Oral QHS Money, Lowry Ram, FNP   10 mg at 02/02/19 2103  . QUEtiapine (SEROQUEL) tablet 300 mg  300 mg Oral QHS Lavona Norsworthy T, MD   300 mg at 02/02/19 2103  . QUEtiapine (SEROQUEL) tablet 50 mg  50 mg Oral BID Money, Lowry Ram, FNP   50 mg at 02/03/19 0856  . temazepam (RESTORIL) capsule 30 mg  30 mg Oral QHS Felis Quillin T, MD      . triamterene-hydrochlorothiazide (MAXZIDE-25) 37.5-25 MG per tablet 1 tablet  1 tablet Oral  Daily Money, Lowry Ram, FNP  1 tablet at 02/03/19 0855    Lab Results:  Results for orders placed or performed during the hospital encounter of 02/01/19 (from the past 48 hour(s))  Hepatic function panel     Status: None   Collection Time: 02/02/19  8:10 AM  Result Value Ref Range   Total Protein 7.4 6.5 - 8.1 g/dL   Albumin 4.0 3.5 - 5.0 g/dL   AST 38 15 - 41 U/L   ALT 25 0 - 44 U/L   Alkaline Phosphatase 76 38 - 126 U/L   Total Bilirubin 0.7 0.3 - 1.2 mg/dL   Bilirubin, Direct <0.1 0.0 - 0.2 mg/dL   Indirect Bilirubin NOT CALCULATED 0.3 - 0.9 mg/dL    Comment: Performed at [facility], [address] [place], [place] [address]  Lipid panel     Status: None   Collection Time: 02/02/19  8:10 AM  Result Value Ref Range   Cholesterol 158 0 - 200 mg/dL   Triglycerides 49 <150 mg/dL   HDL 62 >40 mg/dL   Total CHOL/HDL Ratio 2.5 RATIO   VLDL 10 0 - 40 mg/dL   LDL Cholesterol 86 0 - 99 mg/dL    Comment:        Total Cholesterol/HDL:CHD Risk Coronary Heart Disease Risk Table                     Men   Women  1/2 Average Risk   3.4   3.3  Average Risk       5.0   4.4  2 X Average Risk   9.6   7.1  3 X Average Risk  23.4   11.0        Use the calculated Patient Ratio above and the CHD Risk Table to determine the patient's CHD Risk.        ATP III CLASSIFICATION (LDL):  <100     mg/dL   Optimal  100-129  mg/dL   Near or Above                    Optimal  130-159  mg/dL   Borderline  160-189  mg/dL   High  >190     mg/dL   Very High Performed at [facility], [address] [place], [place] [address]   TSH     Status: None   Collection Time: 02/02/19  8:10 AM  Result Value Ref Range   TSH 1.244 0.350 - 4.500 uIU/mL    Comment: Performed by a 3rd Generation assay with a functional sensitivity of <=0.01 uIU/mL. Performed at [facility] [address] [place], [place] [address]     Blood Alcohol level:  Lab Results  Component Value Date    [facility] <10 01/31/2019   ETH <10 [id number]    Metabolic Disorder Labs: Lab Results  Component Value Date   HGBA1C 5.3 09/30/2018   MPG 105.41 09/30/2018   No results found for: PROLACTIN Lab Results  Component Value Date   CHOL 158 02/02/2019   TRIG 49 02/02/2019   HDL 62 02/02/2019   CHOLHDL 2.5 02/02/2019   VLDL 10 02/02/2019   LDLCALC 86 02/02/2019   LDLCALC 87 09/30/2018    Physical Findings: AIMS:  , ,  ,  ,    CIWA:    COWS:     Musculoskeletal: Strength & Muscle Tone: within normal limits Gait & Station: normal Patient leans: N/A  Psychiatric Specialty Exam: Physical  Exam  Nursing note and vitals reviewed. Constitutional: She appears well-developed and well-nourished.  HENT:  Head: Normocephalic and atraumatic.  Eyes: Pupils are equal, round, and reactive to light. Conjunctivae are normal.  Neck: Normal range of motion.  Cardiovascular: Regular rhythm and normal heart sounds.  Respiratory: Effort normal. No respiratory distress.  GI: Soft.  Musculoskeletal: Normal range of motion.  Neurological: She is alert.  Skin: Skin is warm and dry.  Psychiatric: Her mood appears anxious. Her speech is delayed. She is slowed. Cognition and memory are impaired. She expresses no homicidal and no suicidal ideation.    Review of Systems  Constitutional: Positive for malaise/fatigue.  HENT: Negative.   Eyes: Negative.   Respiratory: Negative.   Cardiovascular: Negative.   Gastrointestinal: Negative.   Musculoskeletal: Negative.   Skin: Negative.   Neurological: Negative.   Psychiatric/Behavioral: Positive for depression and memory loss. Negative for hallucinations, substance abuse and suicidal ideas. The patient is nervous/anxious and has insomnia.     Blood pressure (!) 177/99, pulse 86, temperature 98.7 F (37.1 C), temperature source Oral, resp. rate 17, height 5\' 1"  (1.549 m), weight 49.9 kg, SpO2 (!) 81 %.Body mass index is 20.78 kg/m.  General Appearance:  Fairly Groomed  Eye Contact:  None  Speech:  Slow  Volume:  Decreased  Mood:  Dysphoric  Affect:  Constricted and Depressed  Thought Process:  Coherent  Orientation:  Full (Time, Place, and Person)  Thought Content:  Rumination and Tangential  Suicidal Thoughts:  No  Homicidal Thoughts:  No  Memory:  Immediate;   Fair Recent;   Poor Remote;   Poor  Judgement:  Impaired  Insight:  Fair  Psychomotor Activity:  Decreased  Concentration:  Concentration: Fair  Recall:  Poor  Fund of Knowledge:  Fair  Language:  Fair  Akathisia:  No  Handed:  Right  AIMS (if indicated):     Assets:  Desire for Improvement Housing Physical Health Resilience Social Support  ADL's:  Intact  Cognition:  Impaired,  Mild  Sleep:  Number of Hours: 3.15     Treatment Plan Summary: Daily contact with patient to assess and evaluate symptoms and progress in treatment, Medication management and Plan I proposed to the patient that we try to make sure she gets a decent night sleep tonight as that is clearly her chief complaint and is something that might really get her back into an appropriate rhythm.  I am going to give her 30 mg of Restoril tonight x1.  I would hope that this would get her a solid night sleep and then we can reassess how she is feeling over the next couple days.  For now I will just leave the current dose of Seroquel in place since it is a total of 400 mg a day.  I will increase the daily Klonopin since she is not overly agitated exactly.  We will continue to watch her blood pressure and hope that comes down with the current medication management.  Informed and educated patient about treatment plan.  Psychoeducation and supportive therapy.  Alethia Berthold, MD 02/03/2019, 1:33 PM

## 2019-02-03 NOTE — Plan of Care (Signed)
Data: Patient is appropriate and cooperative to assessment. Patient denies SI/HI and denies AVH. Patient has completed daily self inventory worksheet. Patient reports anxiety and depression. Patient reports somatic complaints of "passing out" during assessment. Patient is assessed no s/s lightheadedness, dizziness, B/P, WNL.  Patient has a pain rating of 0/10. Patient reports poor sleep quality, appetite is poor. Patient rates depression "10/10" , feelings of hopelessness "0/10" and anxiety "10/10" Patients goal for today is "get better and go home."  Action:  Q x 15 minute observation checks were completed for safety. Patient was provided with education on medications. Patient was offered support and encouragement. Patient was given scheduled medications. Patient  was encourage to attend groups, participate in unit activities and continue with plan of care.     Response: Patient has no complaints at this time. Patient is receptive to treatment and safety maintained on unit.    Problem: Education: Goal: Knowledge of [facility] Education information/materials will improve Outcome: Not Progressing Goal: Emotional status will improve Outcome: Not Progressing Goal: Mental status will improve Outcome: Not Progressing Goal: Verbalization of understanding the information provided will improve Outcome: Not Progressing

## 2019-02-03 NOTE — [facility] Group Notes (Signed)
LCSW Group Therapy 02/03/2019 1:00pm  Type of Therapy and Topic:  Group Therapy:  Setting Goals  Participation Level:  Did Not Attend  Description of Group: In this process group, patients discussed using strengths to work toward goals and address challenges.  Patients identified two positive things about themselves and one goal they were working on.  Patients were given the opportunity to share openly and support each other's plan for self-empowerment.  The group discussed the value of gratitude and were encouraged to have a daily reflection of positive characteristics or circumstances.  Patients were encouraged to identify a plan to utilize their strengths to work on current challenges and goals.  Therapeutic Goals 1. Patient will verbalize personal strengths/positive qualities and relate how these can assist with achieving desired personal goals 2. Patients will verbalize affirmation of peers plans for personal change and goal setting 3. Patients will explore the value of gratitude and positive focus as related to successful achievement of goals 4. Patients will verbalize a plan for regular reinforcement of personal positive qualities and circumstances.  Summary of Patient Progress: Patient did not attend.     Therapeutic Modalities Cognitive Behavioral Therapy Motivational Interviewing    Theresa Martin, [place]

## 2019-02-03 NOTE — Plan of Care (Signed)
  Problem: Education: Goal: Knowledge of [facility] Education information/materials will improve Outcome: Progressing Goal: Emotional status will improve Outcome: Progressing Goal: Mental status will improve Outcome: Progressing Goal: Verbalization of understanding the information provided will improve Outcome: Progressing  D: Patient has been needy and attention-seeking all shift. Extremely somatically focused. Asked for Albuterol inhaler. After getting it, complained it made her feel extremely anxious and said she was going to pass out. Offered reassurance, distracting activities, instructed patient in relaxation techniques, and medicated with Ativan per prn order. Patient slept briefly then got up and said she could not sleep because she was numb and "having a stroke". Patient has no facial asymmetry, grips are strong and even, no slurred speech. Patient said her knees were weak because she was having a stroke, and staff helped her to her bed--then five minutes later, she was observed  walking around completely without difficulty in the bathroom. Denies SI, HI and AVH. A: Continue to monitor for safety R: Safety maintained.

## 2019-02-03 NOTE — Progress Notes (Signed)
D: Patient has been needy and attention-seeking all shift. Extremely somatically focused. Asked for Albuterol inhaler. After getting it, complained it made her feel extremely anxious and said she was going to pass out. Offered reassurance, distracting activities, instructed patient in relaxation techniques, and medicated with Ativan per prn order. Patient slept briefly then got up and said she could not sleep because she was numb and "having a stroke". Patient has no facial asymmetry, grips are strong and even, no slurred speech. Patient said her knees were weak because she was having a stroke, and staff helped her to her bed--then five minutes later, she was observed  walking around completely without difficulty in the bathroom. Denies SI, HI and AVH. A: Continue to monitor for safety R: Safety maintained.

## 2019-02-04 MED ORDER — ENSURE ENLIVE PO LIQD
237.0000 mL | Freq: Two times a day (BID) | ORAL | Status: DC
  Administered 2019-02-04 – 2019-02-14 (×21): 237 mL via ORAL

## 2019-02-04 MED ORDER — IBUPROFEN 600 MG PO TABS
600.0000 mg | ORAL_TABLET | Freq: Four times a day (QID) | ORAL | Status: DC | PRN
  Administered 2019-02-09 – 2019-02-11 (×2): 600 mg via ORAL
  Filled 2019-02-04 (×2): qty 1

## 2019-02-04 MED ORDER — CLONAZEPAM 0.5 MG PO TABS
0.5000 mg | ORAL_TABLET | Freq: Two times a day (BID) | ORAL | Status: DC
  Administered 2019-02-04 – 2019-02-06 (×4): 0.5 mg via ORAL
  Filled 2019-02-04 (×5): qty 1

## 2019-02-04 NOTE — Progress Notes (Signed)
Patient remained anxious and restless with frequent complain of neck pain, legs and hands. Reporting numbness in legs but  Able to walk fine when she is alone in room. Requested Gatorade and ensure. Went to the dayroom for dinner. Continues to be redirected and encouraged.

## 2019-02-04 NOTE — Plan of Care (Signed)
Patient is somatic and asking the  nurse to check her vital signs because she is feeling sick , vital signs was done and stable , education and support is provided to patient to attend groups and develop coping skills , patient comply with her medications and no side effects noted, patient understood information provided , patient denies any SI/HI/AVH , only requiring 15 minutes checks , no distress.   Problem: Education: Goal: Knowledge of [facility] Education information/materials will improve Outcome: Progressing Goal: Emotional status will improve Outcome: Progressing Goal: Mental status will improve Outcome: Progressing Goal: Verbalization of understanding the information provided will improve Outcome: Progressing   Problem: Safety: Goal: Periods of time without injury will increase Outcome: Progressing   Problem: Education: Goal: Utilization of techniques to improve thought processes will improve Outcome: Progressing   Problem: Coping: Goal: Coping ability will improve Outcome: Progressing Goal: Will verbalize feelings Outcome: Progressing   Problem: Coping: Goal: Ability to identify and develop effective coping behavior will improve Outcome: Progressing

## 2019-02-04 NOTE — Progress Notes (Signed)
Patient's brother would like for the doctor to call him tomorrow with an update. Patient's brother also stated that she has an inhaler that she uses twice a day, that has been brought in, and if that inhaler in unsuccessful, then she uses the rescue inhaler.

## 2019-02-04 NOTE — [facility] Group Notes (Signed)
LCSW Group Therapy Note 02/04/2019 1:15pm  Type of Therapy and Topic: Group Therapy: Feelings Around Returning Home & Establishing a Supportive Framework and Supporting Oneself When Supports Not Available  Participation Level: Did Not Attend  Description of Group:  Patients first processed thoughts and feelings about upcoming discharge. These included fears of upcoming changes, lack of change, new living environments, judgements and expectations from others and overall stigma of mental health issues. The group then discussed the definition of a supportive framework, what that looks and feels like, and how do to discern it from an unhealthy non-supportive network. The group identified different types of supports as well as what to do when your family/friends are less than helpful or unavailable  Therapeutic Goals  1. Patient will identify one healthy supportive network that they can use at discharge. 2. Patient will identify one factor of a supportive framework and how to tell it from an unhealthy network. 3. Patient able to identify one coping skill to use when they do not have positive supports from others. 4. Patient will demonstrate ability to communicate their needs through discussion and/or role plays.  Summary of Patient Progress:  Pt was invited to attend group but chose not to attend. CSW will continue to encourage pt to attend group throughout their admission.    Therapeutic Modalities Cognitive Behavioral Therapy Motivational Interviewing   Fuquan Wilson  CUEBAS-COLON, LCSW 02/04/2019 11:11 AM

## 2019-02-04 NOTE — Plan of Care (Signed)
Patient is in room awake. Anxious and restless. Has obsessive and somatic thoughts. Missed her breakfast. Reports that she is afraid to get up because she will have a headache. "my entire body is numb...my feet, my fingers..I cant get out of the bed, I don't want to be in pain...". Patient received morning medications with ensure. Was encouraged to get out of the bed and get involved in activities with peers. Safety precautions reinforced.

## 2019-02-04 NOTE — Progress Notes (Signed)
[facility] MD Progress Note  02/04/2019 11:36 AM Theresa Martin  MRN:  [id number] Subjective: Follow-up for this patient with schizoaffective disorder.  She is very anxious today.  When I went to see her she was pacing in her room also with some stereotypical movements.  Complained that her neck was hurting.  Shaking all over.  When we sat down to talk her legs shook the entire time although I noticed that as I was leaving the room they stopped.  Patient having short-term memory problems nervousness appears somewhat paranoid.  She slept a little bit better last night but still only about 6-1/2 hours with all the medicine that she took.  She is not eating very well so nursing has asked that we add Ensure.  Vital signs were okay blood pressure little low. Principal Problem: Schizo affective schizophrenia ([place]) Diagnosis: Principal Problem:   Schizo affective schizophrenia ([place])  Total Time spent with patient: 30 minutes  Past Psychiatric History: Past history of bipolar or schizoaffective disorder with long stability and to recent medication changes  Past Medical History:  Past Medical History:  Diagnosis Date  . Allergy   . Anxiety   . Depression   . Hyperlipidemia   . Hypertension   . Osteopenia after menopause   . Persistent headaches   . Pneumonia   . Postherpetic neuralgia   . Renal disorder   . Shingles outbreak 02/2014  . Skin cancer    moles on right groin and right buttock.    Past Surgical History:  Procedure Laterality Date  . CARDIOVASCULAR STRESS TEST  02/2009   treadmill stress test: Low risk  . [place]  . COLONOSCOPY     2014/2015 [place], normal  . EXPLORATORY LAPAROTOMY     laproscopy for infertility  . EYE SURGERY Bilateral    laser-correct opening b/tn cornea and iris  . MOLE REMOVAL  2017   x 2 moles   Family History:  Family History  Problem Relation Age of Onset  . Hypertension Mother   . Coronary artery disease Mother   .  Heart failure Mother   . Hypertension Father   . Liver disease Sister        liver failure  . Arrhythmia Brother   . Hypertension Brother   . Cancer Neg Hx        colon   . Breast cancer Neg Hx    Family Psychiatric  History: See previous Social History:  Social History   Substance and Sexual Activity  Alcohol Use No  . Alcohol/week: 0.0 standard drinks     Social History   Substance and Sexual Activity  Drug Use No    Social History   Socioeconomic History  . Marital status: Legally Separated    Spouse name: Not on file  . Number of children: 2  . Years of education: Not on file  . Highest education level: Not on file  Occupational History  . Occupation: Control and instrumentation engineer  Social Needs  . Financial resource strain: Not on file  . Food insecurity    Worry: Not on file    Inability: Not on file  . Transportation needs    Medical: Not on file    Non-medical: Not on file  Tobacco Use  . Smoking status: Never Smoker  . Smokeless tobacco: Never Used  Substance and Sexual Activity  . Alcohol use: No    Alcohol/week: 0.0 standard drinks  . Drug use: No  . Sexual  activity: Not on file  Lifestyle  . Physical activity    Days per week: Not on file    Minutes per session: Not on file  . Stress: Not on file  Relationships  . Social Herbalist on phone: Not on file    Gets together: Not on file    Attends religious service: Not on file    Active member of club or organization: Not on file    Attends meetings of clubs or organizations: Not on file    Relationship status: Not on file  Other Topics Concern  . Not on file  Social History Narrative   Regular exercise- yes, spins daily   Diet: fruits and veggies, water    No caffeine   Additional Social History:    Pain Medications: see PTA Prescriptions: see PTA Over the Counter: see PTA                    Sleep: Fair  Appetite:  Fair  Current Medications: Current Facility-Administered  Medications  Medication Dose Route Frequency Provider Last Rate Last Dose  . acetaminophen (TYLENOL) tablet 650 mg  650 mg Oral Q6H PRN Cristofano, Dorene Ar, MD   650 mg at 02/02/19 1721  . albuterol (VENTOLIN HFA) 108 (90 Base) MCG/ACT inhaler 2 puff  2 puff Inhalation Q6H PRN Money, Lowry Ram, FNP   2 puff at 02/03/19 1053  . alum & mag hydroxide-simeth (MAALOX/MYLANTA) 200-200-20 MG/5ML suspension 30 mL  30 mL Oral Q4H PRN Cristofano, Dorene Ar, MD      . aspirin EC tablet 81 mg  81 mg Oral Daily Money, Lowry Ram, FNP   81 mg at 02/04/19 [id number]  . atorvastatin (LIPITOR) tablet 20 mg  20 mg Oral Daily Money, Lowry Ram, FNP   20 mg at 02/03/19 0856  . calcium-vitamin D (OSCAL WITH D) 500-200 MG-UNIT per tablet 1 tablet  1 tablet Oral Daily Money, Lowry Ram, FNP   1 tablet at 02/04/19 [phone]  . clonazePAM (KLONOPIN) tablet 0.5 mg  0.5 mg Oral BID Clapacs, John T, MD      . feeding supplement (ENSURE ENLIVE) (ENSURE ENLIVE) liquid 237 mL  237 mL Oral BID BM Clapacs, John T, MD      . gabapentin (NEURONTIN) capsule 200 mg  200 mg Oral BID Money, Lowry Ram, FNP   200 mg at 02/04/19 [id number]  . LORazepam (ATIVAN) tablet 0.5 mg  0.5 mg Oral Q6H PRN Money, Lowry Ram, FNP   0.5 mg at 02/02/19 2032   Or  . LORazepam (ATIVAN) injection 1 mg  1 mg Intramuscular Q6H PRN Money, Darnelle Maffucci B, FNP      . magnesium hydroxide (MILK OF MAGNESIA) suspension 30 mL  30 mL Oral Daily PRN Cristofano, Dorene Ar, MD      . Melatonin TABS 5 mg  5 mg Oral QHS Money, Lowry Ram, FNP   5 mg at 02/03/19 2120  . montelukast (SINGULAIR) tablet 10 mg  10 mg Oral QHS Money, Lowry Ram, FNP   10 mg at 02/03/19 2120  . QUEtiapine (SEROQUEL) tablet 300 mg  300 mg Oral QHS Clapacs, John T, MD   300 mg at 02/03/19 2120  . QUEtiapine (SEROQUEL) tablet 50 mg  50 mg Oral BID Money, Lowry Ram, FNP   50 mg at 02/04/19 [id number]  . temazepam (RESTORIL) capsule 30 mg  30 mg Oral QHS Clapacs, Madie Reno, MD   30 mg at 02/03/19 2120  .  triamterene-hydrochlorothiazide (MAXZIDE-25)  37.5-25 MG per tablet 1 tablet  1 tablet Oral Daily Money, Lowry Ram, FNP   1 tablet at 02/04/19 [id number]    Lab Results: No results found for this or any previous visit (from the past 48 hour(s)).  Blood Alcohol level:  Lab Results  Component Value Date   ETH <10 01/31/2019   ETH <10 123456    Metabolic Disorder Labs: Lab Results  Component Value Date   HGBA1C 5.3 09/30/2018   MPG 105.41 09/30/2018   No results found for: PROLACTIN Lab Results  Component Value Date   CHOL 158 02/02/2019   TRIG 49 02/02/2019   HDL 62 02/02/2019   CHOLHDL 2.5 02/02/2019   VLDL 10 02/02/2019   LDLCALC 86 02/02/2019   LDLCALC 87 09/30/2018    Physical Findings: AIMS:  , ,  ,  ,    CIWA:    COWS:     Musculoskeletal: Strength & Muscle Tone: within normal limits Gait & Station: normal Patient leans: N/A  Psychiatric Specialty Exam: Physical Exam  Nursing note and vitals reviewed. Constitutional: She appears well-developed and well-nourished.  HENT:  Head: Normocephalic and atraumatic.  Eyes: Pupils are equal, round, and reactive to light. Conjunctivae are normal.  Neck: Normal range of motion.  Cardiovascular: Regular rhythm and normal heart sounds.  Respiratory: Effort normal. No respiratory distress.  GI: Soft.  Musculoskeletal: Normal range of motion.  Neurological: She is alert.  Skin: Skin is warm and dry.  Psychiatric: Her mood appears anxious. Her speech is tangential. She is agitated. She is not aggressive. Thought content is paranoid. Cognition and memory are impaired. She expresses impulsivity and inappropriate judgment. She expresses no homicidal and no suicidal ideation.    Review of Systems  Constitutional: Negative.   HENT: Negative.   Eyes: Negative.   Respiratory: Negative.   Cardiovascular: Negative.   Gastrointestinal: Negative.   Musculoskeletal: Negative.   Skin: Negative.   Neurological: Negative.   Psychiatric/Behavioral: Positive for memory loss.  Negative for depression, hallucinations, substance abuse and suicidal ideas. The patient is nervous/anxious and has insomnia.     Blood pressure 131/65, pulse 93, temperature 97.8 F (36.6 C), temperature source Oral, resp. rate 18, height 5\' 1"  (1.549 m), weight 49.9 kg, SpO2 100 %.Body mass index is 20.78 kg/m.  General Appearance: Casual  Eye Contact:  Minimal  Speech:  Garbled and Slow  Volume:  Decreased  Mood:  Anxious and Dysphoric  Affect:  Constricted and Inappropriate  Thought Process:  Disorganized  Orientation:  Negative  Thought Content:  Paranoid Ideation  Suicidal Thoughts:  No  Homicidal Thoughts:  No  Memory:  Immediate;   Fair Recent;   Poor Remote;   Poor  Judgement:  Impaired  Insight:  Shallow  Psychomotor Activity:  Mannerisms and Restlessness  Concentration:  Concentration: Poor  Recall:  Poor  Fund of Knowledge:  Fair  Language:  Fair  Akathisia:  No  Handed:  Right  AIMS (if indicated):     Assets:  Desire for Improvement Housing Physical Health Social Support  ADL's:  Impaired  Cognition:  Impaired,  Mild  Sleep:  Number of Hours: 6.5     Treatment Plan Summary: Daily contact with patient to assess and evaluate symptoms and progress in treatment, Medication management and Plan Patient was pacing and had mannerisms but I do not think this is akathisia.  She was able to sit down other than the exaggerated shaking of her leg.  Also I  do not think she is having EPS.  I tested her arm and there was no sign of any stiffness at all.  I am concerned about giving her too many benzodiazepines given that we already tried Restoril last night to get her to sleep but she still very nervous.  I am going to increase the clonazepam to 1/2 mg twice a day.  Encourage patient to be eating and drinking appropriately.  Nursing is aware of her current behaviors.  Encourage patient to come out and interact with others on the unit.  We will try to make sure she has pain  medicine as well for her complaints of headache.  Alethia Berthold, MD 02/04/2019, 11:36 AM

## 2019-02-04 NOTE — Plan of Care (Signed)
Patient remains flat, and she has attention seeking ritualistic behavior, she is medication compliant although. She had minimal interaction with peers and staff. She remains sad, flat on approach.Will continue to monitor behaviors and compliance with treatment.

## 2019-02-05 MED ORDER — HALOPERIDOL 5 MG PO TABS
5.0000 mg | ORAL_TABLET | Freq: Three times a day (TID) | ORAL | Status: DC | PRN
  Administered 2019-02-06: 5 mg via ORAL
  Filled 2019-02-05: qty 1

## 2019-02-05 MED ORDER — QUETIAPINE FUMARATE 200 MG PO TABS
400.0000 mg | ORAL_TABLET | Freq: Every day | ORAL | Status: DC
  Administered 2019-02-05 – 2019-02-07 (×3): 400 mg via ORAL
  Filled 2019-02-05 (×4): qty 2

## 2019-02-05 MED ORDER — HALOPERIDOL LACTATE 5 MG/ML IJ SOLN: 5.0000 mg | Freq: Three times a day (TID) | INTRAMUSCULAR | Status: DC | PRN

## 2019-02-05 MED ORDER — GABAPENTIN 300 MG PO CAPS
300.0000 mg | ORAL_CAPSULE | Freq: Three times a day (TID) | ORAL | Status: DC
  Administered 2019-02-05 – 2019-02-10 (×13): 300 mg via ORAL
  Filled 2019-02-05 (×13): qty 1

## 2019-02-05 NOTE — Plan of Care (Signed)
Restless, agitated, with bizarre odd behaviors. Constantly stamping legs with eyes closed. Motor activity is slow: patient came to the medication room, received medication and was unable to walk back to her room. Was taken to her room via a wheelchair. Patient was moved to room 302 secondary to fall risk.

## 2019-02-05 NOTE — Progress Notes (Signed)
Patient fell asleep after taking Ativan 1mg  IM. Woke up around dinner time and was less agitated and was not shaking. Ate dinner and received medications. Currently in room. Safety precautions maintained.

## 2019-02-05 NOTE — [facility] Group Notes (Signed)
LCSW Group Therapy Note   02/05/2019 1:00 PM  Type of Therapy and Topic:  Group Therapy:  Overcoming Obstacles   Participation Level:  Did Not Attend   Description of Group:    In this group patients will be encouraged to explore what they see as obstacles to their own wellness and recovery. They will be guided to discuss their thoughts, feelings, and behaviors related to these obstacles. The group will process together ways to cope with barriers, with attention given to specific choices patients can make. Each patient will be challenged to identify changes they are motivated to make in order to overcome their obstacles. This group will be process-oriented, with patients participating in exploration of their own experiences as well as giving and receiving support and challenge from other group members.   Therapeutic Goals: 1. Patient will identify personal and current obstacles as they relate to admission. 2. Patient will identify barriers that currently interfere with their wellness or overcoming obstacles.  3. Patient will identify feelings, thought process and behaviors related to these barriers. 4. Patient will identify two changes they are willing to make to overcome these obstacles:      Summary of Patient Progress X   Therapeutic Modalities:   Cognitive Behavioral Therapy Solution Focused Therapy Motivational Interviewing Relapse Prevention Therapy  Assunta Curtis, MSW, LCSW 02/05/2019 12:38 PM

## 2019-02-05 NOTE — Progress Notes (Signed)
D - Patient was in her room upon arrival to the unit. Patient presents with high anxiety but denies SI/HI/AVH, denies pain. Patient was isolative to her room this evening.   A - Patient compliant with medication administration per MD orders. Patient given education. Patient given support and encouragement to be active in her treatment plan. Patient informed to let staff know if there are any issues or problems on the unit.   R - Patient being monitored Q 15 minutes for safety per unit protocol. Patient remains safe on the unit.

## 2019-02-05 NOTE — Progress Notes (Signed)
Patient currently in the dayroom, visiting with her brother. Alert and oriented. Calm and cooperative and engaged in conversation. Thought process organized.

## 2019-02-05 NOTE — Progress Notes (Signed)
Recreation Therapy Notes   Date: 02/05/2019  Time: 9:30 am   Location: Craft room   Behavioral response: N/A   Intervention Topic: Values  Discussion/Intervention: Patient did not attend group.   Clinical Observations/Feedback:  Patient did not attend group.   Jolanda Mccann LRT/CTRS        Rosa Wyly 02/05/2019 10:56 AM
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
## 2019-02-05 NOTE — Plan of Care (Signed)
Patient presents with high anxiety   Problem: Education: Goal: Emotional status will improve Outcome: Not Progressing Goal: Mental status will improve Outcome: Not Progressing

## 2019-02-06 MED ORDER — POLYVINYL ALCOHOL 1.4 % OP SOLN
1.0000 [drp] | Freq: Two times a day (BID) | OPHTHALMIC | Status: DC
  Administered 2019-02-06 – 2019-02-15 (×18): 1 [drp] via OPHTHALMIC
  Filled 2019-02-06: qty 15

## 2019-02-06 MED ORDER — BECLOMETHASONE DIPROP HFA 80 MCG/ACT IN AERB
1.0000 | INHALATION_SPRAY | Freq: Two times a day (BID) | RESPIRATORY_TRACT | Status: DC
  Administered 2019-02-06 – 2019-02-13 (×16): 2 via RESPIRATORY_TRACT
  Administered 2019-02-14 (×2): 1 via RESPIRATORY_TRACT
  Administered 2019-02-15: 08:00:00 2 via RESPIRATORY_TRACT
  Filled 2019-02-06: qty 10.6

## 2019-02-06 MED ORDER — CLONAZEPAM 1 MG PO TABS
1.0000 mg | ORAL_TABLET | Freq: Two times a day (BID) | ORAL | Status: DC
  Administered 2019-02-06 – 2019-02-08 (×4): 1 mg via ORAL
  Filled 2019-02-06 (×4): qty 1

## 2019-02-06 MED ORDER — SALINE SPRAY 0.65 % NA SOLN
1.0000 | NASAL | Status: DC | PRN
  Administered 2019-02-09 – 2019-02-15 (×8): 1 via NASAL
  Filled 2019-02-06: qty 44

## 2019-02-06 NOTE — Plan of Care (Signed)
Patient appears anxious and restless most of the shift.Patient stated that she is hearing voices as slamming the door.When patient exhibits panic symptoms,patient states "I cannot see or hear anything."Patient is alert and oriented x4.Denies SI and HI.Compliant with medications.Appetite and energy level good.Support and encouragement given.

## 2019-02-06 NOTE — Progress Notes (Signed)
[facility] MD Progress Note  02/06/2019 12:31 PM Theresa Martin  MRN:  [id number]   Subjective: Patient seen and assessed by writer. Today Probation officer made multiple attempts to talk to patient, however she continued to present with a significant amount of anxiety, restlessness, and psychomotor agitation.  This is a follow-up with the patient with schizoaffective, who presented in a manic state who presented with confusion. She is observed with leg shaking, tremors, and tapping of her upper extremities. Later in the day she is observed with her fingers in her ears and legs shaken. She is unable to communicate with Probation officer both times.  She denies any migraine like headaches today, as noted a rapid head nod. She denies any si/hi/avh.  Principal Problem: Schizo affective schizophrenia ([place]) Diagnosis: Principal Problem:   Schizo affective schizophrenia ([place])  Total Time spent with patient: 30 minutes  Past Psychiatric History: Multiple hospitalizations. Patient is a poor historian and has ben on multiple medications.   Past Medical History:  Past Medical History:  Diagnosis Date  . Allergy   . Anxiety   . Depression   . Hyperlipidemia   . Hypertension   . Osteopenia after menopause   . Persistent headaches   . Pneumonia   . Postherpetic neuralgia   . Renal disorder   . Shingles outbreak 02/2014  . Skin cancer    moles on right groin and right buttock.    Past Surgical History:  Procedure Laterality Date  . CARDIOVASCULAR STRESS TEST  02/2009   treadmill stress test: Low risk  . [place]  . COLONOSCOPY     2014/2015 [place], normal  . EXPLORATORY LAPAROTOMY     laproscopy for infertility  . EYE SURGERY Bilateral    laser-correct opening b/tn cornea and iris  . MOLE REMOVAL  2017   x 2 moles   Family History:  Family History  Problem Relation Age of Onset  . Hypertension Mother   . Coronary artery disease Mother   . Heart failure Mother   . Hypertension  Father   . Liver disease Sister        liver failure  . Arrhythmia Brother   . Hypertension Brother   . Cancer Neg Hx        colon   . Breast cancer Neg Hx    Family Psychiatric  History: None known Social History:  Social History   Substance and Sexual Activity  Alcohol Use No  . Alcohol/week: 0.0 standard drinks     Social History   Substance and Sexual Activity  Drug Use No    Social History   Socioeconomic History  . Marital status: Legally Separated    Spouse name: Not on file  . Number of children: 2  . Years of education: Not on file  . Highest education level: Not on file  Occupational History  . Occupation: Control and instrumentation engineer  Social Needs  . Financial resource strain: Not on file  . Food insecurity    Worry: Not on file    Inability: Not on file  . Transportation needs    Medical: Not on file    Non-medical: Not on file  Tobacco Use  . Smoking status: Never Smoker  . Smokeless tobacco: Never Used  Substance and Sexual Activity  . Alcohol use: No    Alcohol/week: 0.0 standard drinks  . Drug use: No  . Sexual activity: Not on file  Lifestyle  . Physical activity    Days per  week: Not on file    Minutes per session: Not on file  . Stress: Not on file  Relationships  . Social Herbalist on phone: Not on file    Gets together: Not on file    Attends religious service: Not on file    Active member of club or organization: Not on file    Attends meetings of clubs or organizations: Not on file    Relationship status: Not on file  Other Topics Concern  . Not on file  Social History Narrative   Regular exercise- yes, spins daily   Diet: fruits and veggies, water    No caffeine   Additional Social History:    Pain Medications: see PTA Prescriptions: see PTA Over the Counter: see PTA     Sleep: Fair  Appetite:  Poor  Current Medications: Current Facility-Administered Medications  Medication Dose Route Frequency Provider Last Rate  Last Dose  . acetaminophen (TYLENOL) tablet 650 mg  650 mg Oral Q6H PRN Cristofano, Dorene Ar, MD   650 mg at 02/02/19 1721  . albuterol (VENTOLIN HFA) 108 (90 Base) MCG/ACT inhaler 2 puff  2 puff Inhalation Q6H PRN Money, Lowry Ram, FNP   2 puff at 02/03/19 1053  . alum & mag hydroxide-simeth (MAALOX/MYLANTA) 200-200-20 MG/5ML suspension 30 mL  30 mL Oral Q4H PRN Cristofano, Dorene Ar, MD      . aspirin EC tablet 81 mg  81 mg Oral Daily Money, Lowry Ram, FNP   81 mg at 02/06/19 0741  . atorvastatin (LIPITOR) tablet 20 mg  20 mg Oral Daily Money, Lowry Ram, FNP   20 mg at 02/06/19 0741  . calcium-vitamin D (OSCAL WITH D) 500-200 MG-UNIT per tablet 1 tablet  1 tablet Oral Daily Money, Lowry Ram, FNP   1 tablet at 02/06/19 0741  . clonazePAM (KLONOPIN) tablet 0.5 mg  0.5 mg Oral BID Clapacs, Madie Reno, MD   0.5 mg at 02/06/19 0741  . feeding supplement (ENSURE ENLIVE) (ENSURE ENLIVE) liquid 237 mL  237 mL Oral BID BM Clapacs, John T, MD   237 mL at 02/05/19 1450  . gabapentin (NEURONTIN) capsule 300 mg  300 mg Oral TID Money, Lowry Ram, FNP   300 mg at 02/06/19 1209  . haloperidol (HALDOL) tablet 5 mg  5 mg Oral Q8H PRN Money, Lowry Ram, FNP   5 mg at 02/06/19 0741   Or  . haloperidol lactate (HALDOL) injection 5 mg  5 mg Intramuscular Q8H PRN Money, Darnelle Maffucci B, FNP      . ibuprofen (ADVIL) tablet 600 mg  600 mg Oral Q6H PRN Clapacs, John T, MD      . LORazepam (ATIVAN) tablet 0.5 mg  0.5 mg Oral Q6H PRN Money, Lowry Ram, FNP   0.5 mg at 02/04/19 2126   Or  . LORazepam (ATIVAN) injection 1 mg  1 mg Intramuscular Q6H PRN Money, Lowry Ram, FNP   1 mg at 02/05/19 1349  . magnesium hydroxide (MILK OF MAGNESIA) suspension 30 mL  30 mL Oral Daily PRN Cristofano, Dorene Ar, MD      . Melatonin TABS 5 mg  5 mg Oral QHS Money, Lowry Ram, FNP   5 mg at 02/05/19 2116  . montelukast (SINGULAIR) tablet 10 mg  10 mg Oral QHS Money, Lowry Ram, FNP   10 mg at 02/05/19 2116  . NON FORMULARY 1-2 puff  1-2 puff Inhalation BID Suella Broad, FNP      .  QUEtiapine (SEROQUEL) tablet 400 mg  400 mg Oral QHS Clapacs, Madie Reno, MD   400 mg at 02/05/19 2116  . QUEtiapine (SEROQUEL) tablet 50 mg  50 mg Oral BID Money, Lowry Ram, FNP   50 mg at 02/06/19 0741  . temazepam (RESTORIL) capsule 30 mg  30 mg Oral QHS Clapacs, Madie Reno, MD   30 mg at 02/05/19 2116  . triamterene-hydrochlorothiazide (MAXZIDE-25) 37.5-25 MG per tablet 1 tablet  1 tablet Oral Daily Money, Lowry Ram, FNP   1 tablet at 02/06/19 [id number]    Lab Results: No results found for this or any previous visit (from the past 48 hour(s)).  Blood Alcohol level:  Lab Results  Component Value Date   ETH <10 01/31/2019   ETH <10 123456    Metabolic Disorder Labs: Lab Results  Component Value Date   HGBA1C 5.3 09/30/2018   MPG 105.41 09/30/2018   No results found for: PROLACTIN Lab Results  Component Value Date   CHOL 158 02/02/2019   TRIG 49 02/02/2019   HDL 62 02/02/2019   CHOLHDL 2.5 02/02/2019   VLDL 10 02/02/2019   LDLCALC 86 02/02/2019   LDLCALC 87 09/30/2018    Physical Findings: AIMS:  , ,  ,  ,    CIWA:    COWS:     Musculoskeletal: Strength & Muscle Tone: decreased Gait & Station: shuffle Patient leans: N/A  Psychiatric Specialty Exam: Physical Exam  Nursing note and vitals reviewed. Constitutional: She is oriented to person, place, and time. She appears well-developed and well-nourished.  Respiratory: Effort normal.  Musculoskeletal: Normal range of motion.  Neurological: She is alert and oriented to person, place, and time.  Skin: Skin is warm.    Review of Systems  Constitutional: Negative.   HENT: Negative.   Eyes: Negative.   Respiratory: Negative.   Cardiovascular: Negative.   Gastrointestinal: Negative.   Genitourinary: Negative.   Musculoskeletal: Negative.   Skin: Negative.   Neurological: Positive for headaches (chronic migraines for the last 20 years).  Endo/Heme/Allergies: Negative.   Psychiatric/Behavioral: The  patient is nervous/anxious.     Blood pressure 129/70, pulse 100, temperature 97.9 F (36.6 C), temperature source Oral, resp. rate 16, height 5\' 1"  (1.549 m), weight 49.9 kg, SpO2 99 %.Body mass index is 20.78 kg/m.  General Appearance: Disheveled  Eye Contact:  Minimal  Speech:  Blocked  Volume:  Decreased  Mood:  Anxious  Affect:  Congruent  Thought Process:  Coherent, Irrelevant and Descriptions of Associations: Intact  Orientation:  Full (Time, Place, and Person)  Thought Content:  Hallucinations: Auditory  Suicidal Thoughts:  No  Homicidal Thoughts:  No  Memory:  Immediate;   Fair Recent;   Good Remote;   Good  Judgement:  Impaired  Insight:  Lacking  Psychomotor Activity:  Increased, Restlessness and Tremor  Concentration:  Concentration: Fair and Attention Span: Poor  Recall:  [employer] of Knowledge:  Fair  Language:  Fair  Akathisia:  Negative  Handed:  Right  AIMS (if indicated):     Assets:  Desire for Improvement Financial Resources/Insurance Housing Resilience Social Support  ADL's:  Intact  Cognition:  WNL  Sleep:  Number of Hours: 4    Treatment Plan Summary: Daily contact with patient to assess and evaluate symptoms and progress in treatment and Medication management Continue Neurontin to 300 mg p.o. 3 times daily for migraines as well as mood stability Continue Seroquel 300 mg p.o. nightly and 50 mg p.o. twice  daily for schizophrenia Continue Haldol 5 mg p.o. or IM every 8 hours as needed for severe anxiety or agitation Continue Maxide 25 p.o. daily for high blood pressure Continue Klonopin 0.5 mg p.o. twice daily for anxiety Continue Ativan p.o. or IM as needed for anxiety. If patient continues to present with restlessness, tapping and shaking of extremities may benefit from lab draws to check electrolyte and ck.  Encourage group therapy participation Continue every 15 minute safety checks  Suella Broad, FNP 02/06/2019, 12:31 PM

## 2019-02-06 NOTE — Tx Team (Addendum)
Interdisciplinary Treatment and Diagnostic Plan Update  02/06/2019 Time of Session: 9:00AM Theresa Martin MRN: [id number]  Principal Diagnosis: Schizo affective schizophrenia [facility])  Secondary Diagnoses: Principal Problem:   Schizo affective schizophrenia ([place])   Current Medications:  Current Facility-Administered Medications  Medication Dose Route Frequency Provider Last Rate Last Dose  . acetaminophen (TYLENOL) tablet 650 mg  650 mg Oral Q6H PRN Cristofano, Dorene Ar, MD   650 mg at 02/02/19 1721  . albuterol (VENTOLIN HFA) 108 (90 Base) MCG/ACT inhaler 2 puff  2 puff Inhalation Q6H PRN Money, Lowry Ram, FNP   2 puff at 02/03/19 1053  . alum & mag hydroxide-simeth (MAALOX/MYLANTA) 200-200-20 MG/5ML suspension 30 mL  30 mL Oral Q4H PRN Cristofano, Dorene Ar, MD      . aspirin EC tablet 81 mg  81 mg Oral Daily Money, Lowry Ram, FNP   81 mg at 02/06/19 0741  . atorvastatin (LIPITOR) tablet 20 mg  20 mg Oral Daily Money, Lowry Ram, FNP   20 mg at 02/06/19 0741  . calcium-vitamin D (OSCAL WITH D) 500-200 MG-UNIT per tablet 1 tablet  1 tablet Oral Daily Money, Lowry Ram, FNP   1 tablet at 02/06/19 0741  . clonazePAM (KLONOPIN) tablet 0.5 mg  0.5 mg Oral BID Clapacs, Madie Reno, MD   0.5 mg at 02/06/19 0741  . feeding supplement (ENSURE ENLIVE) (ENSURE ENLIVE) liquid 237 mL  237 mL Oral BID BM Clapacs, John T, MD   237 mL at 02/06/19 1011  . gabapentin (NEURONTIN) capsule 300 mg  300 mg Oral TID Money, Lowry Ram, FNP   300 mg at 02/06/19 1209  . haloperidol (HALDOL) tablet 5 mg  5 mg Oral Q8H PRN Money, Lowry Ram, FNP   5 mg at 02/06/19 0741   Or  . haloperidol lactate (HALDOL) injection 5 mg  5 mg Intramuscular Q8H PRN Money, Darnelle Maffucci B, FNP      . ibuprofen (ADVIL) tablet 600 mg  600 mg Oral Q6H PRN Clapacs, John T, MD      . LORazepam (ATIVAN) tablet 0.5 mg  0.5 mg Oral Q6H PRN Money, Lowry Ram, FNP   0.5 mg at 02/04/19 2126   Or  . LORazepam (ATIVAN) injection 1 mg  1 mg Intramuscular Q6H PRN Money,  Lowry Ram, FNP   1 mg at 02/05/19 1349  . magnesium hydroxide (MILK OF MAGNESIA) suspension 30 mL  30 mL Oral Daily PRN Cristofano, Dorene Ar, MD      . Melatonin TABS 5 mg  5 mg Oral QHS Money, Lowry Ram, FNP   5 mg at 02/05/19 2116  . montelukast (SINGULAIR) tablet 10 mg  10 mg Oral QHS Money, Lowry Ram, FNP   10 mg at 02/05/19 2116  . NON FORMULARY 1-2 puff  1-2 puff Inhalation BID Suella Broad, FNP      . QUEtiapine (SEROQUEL) tablet 400 mg  400 mg Oral QHS Clapacs, Madie Reno, MD   400 mg at 02/05/19 2116  . QUEtiapine (SEROQUEL) tablet 50 mg  50 mg Oral BID Money, Lowry Ram, FNP   50 mg at 02/06/19 0741  . temazepam (RESTORIL) capsule 30 mg  30 mg Oral QHS Clapacs, Madie Reno, MD   30 mg at 02/05/19 2116  . triamterene-hydrochlorothiazide (MAXZIDE-25) 37.5-25 MG per tablet 1 tablet  1 tablet Oral Daily Money, Lowry Ram, FNP   1 tablet at 02/06/19 5003   PTA Medications: Medications Prior to Admission  Medication Sig Dispense Refill Last  Dose  . albuterol (PROVENTIL HFA) 108 (90 Base) MCG/ACT inhaler Inhale into the lungs every 6 (six) hours as needed for wheezing or shortness of breath.     Marland Kitchen aspirin EC 81 MG tablet Take 81 mg by mouth daily.     Marland Kitchen atorvastatin (LIPITOR) 20 MG tablet TAKE 1 TABLET BY MOUTH EVERY DAY (Patient taking differently: Take 20 mg by mouth daily. ) 30 tablet 6   . beclomethasone (QVAR) 80 MCG/ACT inhaler Inhale 1-2 puffs into the lungs 2 (two) times daily. 10.6 g 10   . Calcium-Vitamin D-Vitamin K (CALCIUM + D + K PO) Take 3 each by mouth daily. Calcium 577m- Vitamin D 1000iu- K 468m     . clonazePAM (KLONOPIN) 0.5 MG tablet Take 0.25 mg by mouth at bedtime as needed for anxiety.   2   . ELDERBERRY PO Take 1 capsule by mouth daily.      . Marland Kitchenscitalopram (LEXAPRO) 10 MG tablet Take 10 mg by mouth daily.      . Marland Kitchenabapentin (NEURONTIN) 100 MG capsule Take 1-3 capsules (100-300 mg total) by mouth 2 (two) times daily. (Patient taking differently: Take 300 mg by mouth at  bedtime. ) 180 capsule 12   . Glucosamine-Chondroitin (MOVE FREE PO) Take 1 tablet by mouth daily.     . Marland Kitchenetoconazole-Hydrocortisone 2 & 1 % KIT Apply topically as needed. Cracks on mouth     . lamoTRIgine (LAMICTAL) 100 MG tablet Take 100 mg by mouth daily.      . Melatonin 5 MG CAPS Take 1 capsule (5 mg total) by mouth at bedtime. 30 capsule 0   . montelukast (SINGULAIR) 10 MG tablet Take 1 tablet (10 mg total) by mouth at bedtime. 30 tablet 3   . Naproxen Sodium (ALEVE) 220 MG CAPS As needed for headache (Patient taking differently: Take 220 mg by mouth daily as needed (headache). ) 60 each    . QUEtiapine (SEROQUEL) 100 MG tablet Take 100 mg by mouth 3 (three) times daily. Pt takes 1 tablet in the AM and 2 tablets in the PM     . triamterene-hydrochlorothiazide (MAXZIDE-25) 37.5-25 MG tablet TAKE 1 TABLET BY MOUTH EVERY DAY (Patient taking differently: Take 1 tablet by mouth daily. 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   Physician Treatment Plan for Primary Diagnosis: Schizo affective schizophrenia ([place]rail[place] Term Goal(s): Improvement in symptoms so as ready for discharge Improvement in symptoms so as ready for discharge   Short Term Goals: Ability to verbalize feelings will improve Ability to demonstrate self-control will improve Ability to identify and develop effective coping behaviors will improve Ability to maintain clinical measurements within normal limits will improve Compliance with prescribed medications will improve Ability to verbalize feelings will improve Ability to demonstrate self-control will improve Ability to identify and develop effective coping behaviors will improve Ability to maintain  clinical measurements within normal limits will improve Compliance with prescribed medications will improve  Medication Management: Evaluate patient's response, side effects, and tolerance of medication regimen.  Therapeutic Interventions: 1 to 1 sessions, Unit Group sessions and Medication administration.  Evaluation of Outcomes: Not Progressing  Physician Treatment Plan for Secondary Diagnosis: Principal Problem:   Schizo affective schizophrenia ([place] Long Term Goal(s): Improvement in symptoms so as ready  for discharge Improvement in symptoms so as ready for discharge   Short Term Goals: Ability to verbalize feelings will improve Ability to demonstrate self-control will improve Ability to identify and develop effective coping behaviors will improve Ability to maintain clinical measurements within normal limits will improve Compliance with prescribed medications will improve Ability to verbalize feelings will improve Ability to demonstrate self-control will improve Ability to identify and develop effective coping behaviors will improve Ability to maintain clinical measurements within normal limits will improve Compliance with prescribed medications will improve     Medication Management: Evaluate patient's response, side effects, and tolerance of medication regimen.  Therapeutic Interventions: 1 to 1 sessions, Unit Group sessions and Medication administration.  Evaluation of Outcomes: Not Progressing   RN Treatment Plan for Primary Diagnosis: Schizo affective schizophrenia ([place]) Long Term Goal(s): Knowledge of disease and therapeutic regimen to maintain health will improve  Short Term Goals: Ability to demonstrate self-control, Ability to participate in decision making will improve, Ability to verbalize feelings will improve, Ability to disclose and discuss suicidal ideas, Ability to identify and develop effective coping behaviors will improve and Compliance with prescribed  medications will improve  Medication Management: RN will administer medications as ordered by provider, will assess and evaluate patient's response and provide education to patient for prescribed medication. RN will report any adverse and/or side effects to prescribing provider.  Therapeutic Interventions: 1 on 1 counseling sessions, Psychoeducation, Medication administration, Evaluate responses to treatment, Monitor vital signs and CBGs as ordered, Perform/monitor CIWA, COWS, AIMS and Fall Risk screenings as ordered, Perform wound care treatments as ordered.  Evaluation of Outcomes: Not Progressing   LCSW Treatment Plan for Primary Diagnosis: Schizo affective schizophrenia ([place]) Long Term Goal(s): Safe transition to appropriate next level of care at discharge, Engage patient in therapeutic group addressing interpersonal concerns.  Short Term Goals: Engage patient in aftercare planning with referrals and resources, Increase social support, Increase ability to appropriately verbalize feelings, Increase emotional regulation, Facilitate acceptance of mental health diagnosis and concerns and Increase skills for wellness and recovery  Therapeutic Interventions: Assess for all discharge needs, 1 to 1 time with Social worker, Explore available resources and support systems, Assess for adequacy in community support network, Educate family and significant other(s) on suicide prevention, Complete Psychosocial Assessment, Interpersonal group therapy.  Evaluation of Outcomes: Not Progressing   Progress in Treatment: Attending groups: No. Participating in groups: No. Taking medication as prescribed: Yes. Toleration medication: Yes. Family/Significant other contact made: Yes, individual(s) contacted:  SPE completed withthe patient's brother.  Patient understands diagnosis: No. Discussing patient identified problems/goals with staff: Yes. Medical problems stabilized or resolved: Yes. Denies  suicidal/homicidal ideation: Yes. Issues/concerns per patient self-inventory: No. Other: none  New problem(s) identified: No, Describe:  none  New Short Term/Long Term Goal(s): elimination of symptoms of psychosis, medication management for mood stabilization; elimination of SI thoughts; development of comprehensive mental wellness plan.  Patient Goals:  "to go home"  Discharge Plan or Barriers: Patient reports plans to return to her home.  Patient sees a Quarry manager at [facility].  Patient reports a desire to continue with them.    Reason for Continuation of Hospitalization: Anxiety Depression Medication stabilization  Estimated Length of Stay: 1-7 days   Recreational Therapy: Patient: N/A Patient Goal: Patient will engage in groups without prompting or encouragement from LRT x3 group sessions within 5 recreation therapy group sessions  Attendees: Patient: Kevina Piloto 02/06/2019 1:21 PM  Physician: Dr. Weber Cooks, MD 02/06/2019 1:21 PM  Nursing:  02/06/2019 1:21 PM  RN Care Manager: 02/06/2019 1:21 PM  Social Worker: Assunta Curtis, LCSW 02/06/2019 1:21 PM  Recreational Therapist:  02/06/2019 1:21 PM  Other:  02/06/2019 1:21 PM  Other:  02/06/2019 1:21 PM  Other: 02/06/2019 1:21 PM    Scribe for Treatment Team: Rozann Lesches, LCSW 02/06/2019 1:21 PM

## 2019-02-06 NOTE — Progress Notes (Signed)
Recreation Therapy Notes  Date: 02/06/2019  Time: 9:30 am   Location: Craft room   Behavioral response: N/A   Intervention Topic: Relaxation  Discussion/Intervention: Patient did not attend group.   Clinical Observations/Feedback:  Patient did not attend group.   Cru Kritikos LRT/CTRS        Isabell Bonafede 02/06/2019 11:13 AM

## 2019-02-06 NOTE — [facility] Group Notes (Signed)
Feelings Around Diagnosis 02/06/2019 1PM  Type of Therapy/Topic:  Group Therapy:  Feelings about Diagnosis  Participation Level:  Did Not Attend   Description of Group:   This group will allow patients to explore their thoughts and feelings about diagnoses they have received. Patients will be guided to explore their level of understanding and acceptance of these diagnoses. Facilitator will encourage patients to process their thoughts and feelings about the reactions of others to their diagnosis and will guide patients in identifying ways to discuss their diagnosis with significant others in their lives. This group will be process-oriented, with patients participating in exploration of their own experiences, giving and receiving support, and processing challenge from other group members.   Therapeutic Goals: 1. Patient will demonstrate understanding of diagnosis as evidenced by identifying two or more symptoms of the disorder 2. Patient will be able to express two feelings regarding the diagnosis 3. Patient will demonstrate their ability to communicate their needs through discussion and/or role play  Summary of Patient Progress:       Therapeutic Modalities:   Cognitive Behavioral Therapy Brief Therapy Feelings Identification    Yvette Rack, LCSW 02/06/2019 1:46 PM

## 2019-02-07 NOTE — Progress Notes (Signed)
Patient alert and oriented x 4, affect is flat, thoughts are organized  speech is soft, she appears less anxious , she was offered emotional support and encouraged to use coping skills. Patient is receptive to staff, she was complaint with evening medication, and went into her room, 15 minutes safety checks maintained will continue to monitor.

## 2019-02-07 NOTE — Plan of Care (Signed)
Pt denies depression, SI, HI and AVH. Pt rates anxiety 10/10. Pt was educated on care plan and verbalizes understanding. Collier Bullock RN Problem: Education: Goal: Knowledge of [facility] Education information/materials will improve Outcome: Progressing Goal: Emotional status will improve Outcome: Not Progressing Goal: Mental status will improve Outcome: Not Progressing Goal: Verbalization of understanding the information provided will improve Outcome: Progressing   Problem: Safety: Goal: Periods of time without injury will increase Outcome: Progressing   Problem: Education: Goal: Utilization of techniques to improve thought processes will improve Outcome: Progressing   Problem: Coping: Goal: Coping ability will improve Outcome: Not Progressing Goal: Will verbalize feelings Outcome: Progressing   Problem: Coping: Goal: Ability to identify and develop effective coping behavior will improve Outcome: Not Progressing

## 2019-02-07 NOTE — [facility] Group Notes (Signed)
[facility] Group Notes:  (Nursing/MHT/Case Management/Adjunct)  Date:  02/07/2019  Time:  7:30 AM  Type of Therapy:  Group Therapy  Participation Level:  Did Not Attend   Theresa Martin 02/07/2019, 7:30 AM

## 2019-02-07 NOTE — [facility] Group Notes (Signed)
LCSW Group Therapy Note  02/07/2019 1:58 PM  Type of Therapy/Topic:  Group Therapy:  Emotion Regulation  Participation Level:  None   Description of Group:   The purpose of this group is to assist patients in learning to regulate negative emotions and experience positive emotions. Patients will be guided to discuss ways in which they have been vulnerable to their negative emotions. These vulnerabilities will be juxtaposed with experiences of positive emotions or situations, and patients will be challenged to use positive emotions to combat negative ones. Special emphasis will be placed on coping with negative emotions in conflict situations, and patients will process healthy conflict resolution skills.  Therapeutic Goals: 1. Patient will identify two positive emotions or experiences to reflect on in order to balance out negative emotions 2. Patient will label two or more emotions that they find the most difficult to experience 3. Patient will demonstrate positive conflict resolution skills through discussion and/or role plays  Summary of Patient Progress: Pt was present in group but did not engage in the group discussion.   Therapeutic Modalities:   Cognitive Behavioral Therapy Feelings Identification Dialectical Behavioral Therapy   Evalina Field, MSW, LCSW Clinical Social Work 02/07/2019 1:58 PM

## 2019-02-07 NOTE — Progress Notes (Signed)
Recreation Therapy Notes  Date: 02/07/2019  Time: 9:30 am   Location: Craft room   Behavioral response: N/A   Intervention Topic: Problem Solving  Discussion/Intervention: Patient did not attend group.   Clinical Observations/Feedback:  Patient did not attend group.   Avi Archuleta LRT/CTRS        Gwynn Chalker 02/07/2019 10:44 AM

## 2019-02-07 NOTE — Progress Notes (Signed)
[facility] MD Progress Note  02/07/2019 12:46 PM Theresa Martin  MRN:  [id number] Subjective: Follow-up patient with schizoaffective disorder.  Patient seen and chart reviewed.  Also spoke with her brother yesterday evening.  Today the patient was unsteady on her feet but did not fall.  She was able to sit in the office with me and carry on a pretty clear conversation.  She is a little disorganized about the date but is making an effort to try and improve her short-term memory.  Mood is still mostly seeming confused at this point.  Did not make any obviously psychotic statements.  Admits to still having some hallucinations.  Does not have as many physical complaints as she did previously. Principal Problem: Schizo affective schizophrenia ([place]) Diagnosis: Principal Problem:   Schizo affective schizophrenia ([place])  Total Time spent with patient: 30 minutes  Past Psychiatric History: Patient has a longstanding history of stability on medication with recent decompensation  Past Medical History:  Past Medical History:  Diagnosis Date  . Allergy   . Anxiety   . Depression   . Hyperlipidemia   . Hypertension   . Osteopenia after menopause   . Persistent headaches   . Pneumonia   . Postherpetic neuralgia   . Renal disorder   . Shingles outbreak 02/2014  . Skin cancer    moles on right groin and right buttock.    Past Surgical History:  Procedure Laterality Date  . CARDIOVASCULAR STRESS TEST  02/2009   treadmill stress test: Low risk  . [place]  . COLONOSCOPY     2014/2015 [place], normal  . EXPLORATORY LAPAROTOMY     laproscopy for infertility  . EYE SURGERY Bilateral    laser-correct opening b/tn cornea and iris  . MOLE REMOVAL  2017   x 2 moles   Family History:  Family History  Problem Relation Age of Onset  . Hypertension Mother   . Coronary artery disease Mother   . Heart failure Mother   . Hypertension Father   . Liver disease Sister        liver  failure  . Arrhythmia Brother   . Hypertension Brother   . Cancer Neg Hx        colon   . Breast cancer Neg Hx    Family Psychiatric  History: See previous Social History:  Social History   Substance and Sexual Activity  Alcohol Use No  . Alcohol/week: 0.0 standard drinks     Social History   Substance and Sexual Activity  Drug Use No    Social History   Socioeconomic History  . Marital status: Legally Separated    Spouse name: Not on file  . Number of children: 2  . Years of education: Not on file  . Highest education level: Not on file  Occupational History  . Occupation: Control and instrumentation engineer  Social Needs  . Financial resource strain: Not on file  . Food insecurity    Worry: Not on file    Inability: Not on file  . Transportation needs    Medical: Not on file    Non-medical: Not on file  Tobacco Use  . Smoking status: Never Smoker  . Smokeless tobacco: Never Used  Substance and Sexual Activity  . Alcohol use: No    Alcohol/week: 0.0 standard drinks  . Drug use: No  . Sexual activity: Not on file  Lifestyle  . Physical activity    Days per week: Not on  file    Minutes per session: Not on file  . Stress: Not on file  Relationships  . Social Herbalist on phone: Not on file    Gets together: Not on file    Attends religious service: Not on file    Active member of club or organization: Not on file    Attends meetings of clubs or organizations: Not on file    Relationship status: Not on file  Other Topics Concern  . Not on file  Social History Narrative   Regular exercise- yes, spins daily   Diet: fruits and veggies, water    No caffeine   Additional Social History:    Pain Medications: see PTA Prescriptions: see PTA Over the Counter: see PTA                    Sleep: Fair  Appetite:  Fair  Current Medications: Current Facility-Administered Medications  Medication Dose Route Frequency Provider Last Rate Last Dose  .  acetaminophen (TYLENOL) tablet 650 mg  650 mg Oral Q6H PRN Cristofano, Dorene Ar, MD   650 mg at 02/02/19 1721  . albuterol (VENTOLIN HFA) 108 (90 Base) MCG/ACT inhaler 2 puff  2 puff Inhalation Q6H PRN Money, Lowry Ram, FNP   2 puff at 02/03/19 1053  . alum & mag hydroxide-simeth (MAALOX/MYLANTA) 200-200-20 MG/5ML suspension 30 mL  30 mL Oral Q4H PRN Cristofano, Dorene Ar, MD      . aspirin EC tablet 81 mg  81 mg Oral Daily Money, Lowry Ram, FNP   81 mg at 02/07/19 0831  . atorvastatin (LIPITOR) tablet 20 mg  20 mg Oral Daily Money, Lowry Ram, FNP   20 mg at 02/07/19 [id number]  . beclomethasone (QVAR) 80 MCG/ACT inhaler 1-2 puff  1-2 puff Inhalation BID Suella Broad, FNP   2 puff at 02/07/19 0835  . calcium-vitamin D (OSCAL WITH D) 500-200 MG-UNIT per tablet 1 tablet  1 tablet Oral Daily Money, Lowry Ram, FNP   1 tablet at 02/07/19 0831  . clonazePAM (KLONOPIN) tablet 1 mg  1 mg Oral BID Clapacs, Madie Reno, MD   1 mg at 02/07/19 [id number]  . feeding supplement (ENSURE ENLIVE) (ENSURE ENLIVE) liquid 237 mL  237 mL Oral BID BM Clapacs, John T, MD   237 mL at 02/07/19 1000  . gabapentin (NEURONTIN) capsule 300 mg  300 mg Oral TID Money, Lowry Ram, FNP   300 mg at 02/07/19 1225  . haloperidol (HALDOL) tablet 5 mg  5 mg Oral Q8H PRN Money, Lowry Ram, FNP   5 mg at 02/06/19 0741   Or  . haloperidol lactate (HALDOL) injection 5 mg  5 mg Intramuscular Q8H PRN Money, Darnelle Maffucci B, FNP      . ibuprofen (ADVIL) tablet 600 mg  600 mg Oral Q6H PRN Clapacs, John T, MD      . LORazepam (ATIVAN) tablet 0.5 mg  0.5 mg Oral Q6H PRN Money, Lowry Ram, FNP   0.5 mg at 02/06/19 1329   Or  . LORazepam (ATIVAN) injection 1 mg  1 mg Intramuscular Q6H PRN Money, Lowry Ram, FNP   1 mg at 02/05/19 1349  . magnesium hydroxide (MILK OF MAGNESIA) suspension 30 mL  30 mL Oral Daily PRN Cristofano, Dorene Ar, MD      . Melatonin TABS 5 mg  5 mg Oral QHS Money, Lowry Ram, FNP   5 mg at 02/06/19 2138  . montelukast (SINGULAIR) tablet 10  mg  10 mg Oral QHS  Money, Lowry Ram, FNP   10 mg at 02/06/19 2138  . polyvinyl alcohol (LIQUIFILM TEARS) 1.4 % ophthalmic solution 1 drop  1 drop Both Eyes BID Clapacs, Madie Reno, MD   1 drop at 02/07/19 0837  . QUEtiapine (SEROQUEL) tablet 400 mg  400 mg Oral QHS Clapacs, John T, MD   400 mg at 02/06/19 2100  . QUEtiapine (SEROQUEL) tablet 50 mg  50 mg Oral BID Money, Lowry Ram, FNP   50 mg at 02/07/19 0831  . sodium chloride (OCEAN) 0.65 % nasal spray 1 spray  1 spray Each Nare PRN Clapacs, John T, MD      . temazepam (RESTORIL) capsule 30 mg  30 mg Oral QHS Clapacs, Madie Reno, MD   30 mg at 02/06/19 2138  . triamterene-hydrochlorothiazide (MAXZIDE-25) 37.5-25 MG per tablet 1 tablet  1 tablet Oral Daily Money, Lowry Ram, FNP   1 tablet at 02/07/19 0831    Lab Results: No results found for this or any previous visit (from the past 48 hour(s)).  Blood Alcohol level:  Lab Results  Component Value Date   ETH <10 01/31/2019   ETH <10 123456    Metabolic Disorder Labs: Lab Results  Component Value Date   HGBA1C 5.3 09/30/2018   MPG 105.41 09/30/2018   No results found for: PROLACTIN Lab Results  Component Value Date   CHOL 158 02/02/2019   TRIG 49 02/02/2019   HDL 62 02/02/2019   CHOLHDL 2.5 02/02/2019   VLDL 10 02/02/2019   LDLCALC 86 02/02/2019   LDLCALC 87 09/30/2018    Physical Findings: AIMS:  , ,  ,  ,    CIWA:    COWS:     Musculoskeletal: Strength & Muscle Tone: within normal limits Gait & Station: unsteady Patient leans: N/A  Psychiatric Specialty Exam: Physical Exam  Nursing note and vitals reviewed. Constitutional: She appears well-developed and well-nourished.  HENT:  Head: Normocephalic and atraumatic.  Eyes: Pupils are equal, round, and reactive to light. Conjunctivae are normal.  Neck: Normal range of motion.  Cardiovascular: Regular rhythm and normal heart sounds.  Respiratory: Effort normal. No respiratory distress.  GI: Soft.  Musculoskeletal: Normal range of motion.   Neurological: She is alert.  Skin: Skin is warm and dry.  Psychiatric: Her speech is normal. Judgment normal. Her mood appears anxious. She is slowed. Thought content is paranoid. Thought content is not delusional. Cognition and memory are impaired. She expresses no homicidal and no suicidal ideation.    Review of Systems  Constitutional: Negative.   HENT: Negative.   Eyes: Negative.   Respiratory: Negative.   Cardiovascular: Negative.   Gastrointestinal: Negative.   Musculoskeletal: Negative.   Skin: Negative.   Neurological: Negative.   Psychiatric/Behavioral: Positive for hallucinations and memory loss. The patient is nervous/anxious.     Blood pressure (!) 146/64, pulse 77, temperature 97.7 F (36.5 C), temperature source Oral, resp. rate 18, height 5\' 1"  (1.549 m), weight 49.9 kg, SpO2 100 %.Body mass index is 20.78 kg/m.  General Appearance: Casual  Eye Contact:  Fair  Speech:  Slow  Volume:  Decreased  Mood:  Anxious  Affect:  Congruent and Constricted  Thought Process:  Coherent  Orientation:  Full (Time, Place, and Person)  Thought Content:  Rumination and Tangential  Suicidal Thoughts:  No  Homicidal Thoughts:  No  Memory:  Immediate;   Fair Recent;   Poor Remote;   Fair  Judgement:  Fair  Insight:  Shallow  Psychomotor Activity:  Decreased  Concentration:  Concentration: Fair  Recall:  [employer] of Knowledge:  Fair  Language:  Fair  Akathisia:  No  Handed:  Right  AIMS (if indicated):     Assets:  Desire for Improvement Financial Resources/Insurance Housing Physical Health Resilience Social Support  ADL's:  Impaired  Cognition:  Impaired,  Mild  Sleep:  Number of Hours: 7.15     Treatment Plan Summary: Daily contact with patient to assess and evaluate symptoms and progress in treatment, Medication management and Plan She feels like she is a little confused still and is still having a little trouble with ongoing short-term memory but overall today  she seems to be emotionally more stable and able to carry on a more reasonable conversation.  Rather than increase the dose of anything today we will leave her Seroquel and everything at the same doses.  If she starts to sleep more regularly we can start backing off on some of the sleep medicine which might be leaving her oversedated during the day.  Encourage patient and her participation on the ward.  Alethia Berthold, MD 02/07/2019, 12:46 PM

## 2019-02-08 MED ORDER — QUETIAPINE FUMARATE ER 200 MG PO TB24
400.0000 mg | ORAL_TABLET | Freq: Every day | ORAL | Status: DC
  Administered 2019-02-08 – 2019-02-14 (×7): 400 mg via ORAL
  Filled 2019-02-08 (×8): qty 2

## 2019-02-08 MED ORDER — ACYCLOVIR 5 % EX OINT
TOPICAL_OINTMENT | CUTANEOUS | Status: DC
  Administered 2019-02-08 – 2019-02-09 (×3): via TOPICAL
  Administered 2019-02-09: 1 via TOPICAL
  Administered 2019-02-09 – 2019-02-10 (×2): via TOPICAL
  Administered 2019-02-10: 1 via TOPICAL
  Administered 2019-02-10 (×3): via TOPICAL
  Administered 2019-02-10: 1 via TOPICAL
  Administered 2019-02-11 – 2019-02-14 (×15): via TOPICAL
  Administered 2019-02-14: 1 via TOPICAL
  Administered 2019-02-14 – 2019-02-15 (×2): via TOPICAL
  Filled 2019-02-08: qty 15
  Filled 2019-02-08 (×2): qty 5
  Filled 2019-02-08: qty 15

## 2019-02-08 MED ORDER — QUETIAPINE FUMARATE 300 MG PO TABS
500.0000 mg | ORAL_TABLET | Freq: Every day | ORAL | Status: DC
  Filled 2019-02-08: qty 1

## 2019-02-08 MED ORDER — CLONAZEPAM 0.5 MG PO TABS
0.5000 mg | ORAL_TABLET | Freq: Two times a day (BID) | ORAL | Status: DC
  Administered 2019-02-08 – 2019-02-15 (×14): 0.5 mg via ORAL
  Filled 2019-02-08 (×14): qty 1

## 2019-02-08 MED ORDER — BIOTENE DRY MOUTH MT LIQD: 15.0000 mL | OROMUCOSAL | Status: DC | PRN

## 2019-02-08 NOTE — [facility] Group Notes (Signed)
LCSW Group Therapy Note  02/08/2019 1:00 PM  Type of Therapy/Topic:  Group Therapy:  Balance in Life  Participation Level:  Did Not Attend  Description of Group:    This group will address the concept of balance and how it feels and looks when one is unbalanced. Patients will be encouraged to process areas in their lives that are out of balance and identify reasons for remaining unbalanced. Facilitators will guide patients in utilizing problem-solving interventions to address and correct the stressor making their life unbalanced. Understanding and applying boundaries will be explored and addressed for obtaining and maintaining a balanced life. Patients will be encouraged to explore ways to assertively make their unbalanced needs known to significant others in their lives, using other group members and facilitator for support and feedback.  Therapeutic Goals: 1. Patient will identify two or more emotions or situations they have that consume much of in their lives. 2. Patient will identify signs/triggers that life has become out of balance:  3. Patient will identify two ways to set boundaries in order to achieve balance in their lives:  4. Patient will demonstrate ability to communicate their needs through discussion and/or role plays  Summary of Patient Progress: X     Therapeutic Modalities:   Cognitive Behavioral Therapy Solution-Focused Therapy Assertiveness Training  Assunta Curtis MSW, LCSW 02/08/2019 2:25 PM

## 2019-02-08 NOTE — Plan of Care (Signed)
  Problem: Coping: Goal: Ability to identify and develop effective coping behavior will improve Outcome: Progressing  Patient is less anxious, interacting with peers and staff appropriately  and using coping skills.

## 2019-02-08 NOTE — Plan of Care (Signed)
D- Patient alert and oriented. Patient presents in an anxious/preoccupied mood on assessment reporting that she slept "great" last night "until all of the noise". Patient did not endorse any anxiety, however, she continues to sit in her room as well as the dayroom shaking her leg. When this writer asked patient about any signs/symptoms of depression, patient stated "that's what I take medicine for". Patient denies SI, HI, AVH, and pain at this time. Patient's goal for today is to "feel better".  A- Scheduled medications administered to patient, per MD orders. Support and encouragement provided.  Routine safety checks conducted every 15 minutes.  Patient informed to notify staff with problems or concerns.  R- No adverse drug reactions noted. Patient contracts for safety at this time. Patient compliant with medications and treatment plan. Patient receptive, calm, and cooperative. Patient interacts well with others on the unit.  Patient remains safe at this time.  Problem: Education: Goal: Knowledge of [place] [facility] Education information/materials will improve Outcome: Progressing Goal: Emotional status will improve Outcome: Progressing Goal: Mental status will improve Outcome: Progressing Goal: Verbalization of understanding the information provided will improve Outcome: Progressing   Problem: Safety: Goal: Periods of time without injury will increase Outcome: Progressing   Problem: Education: Goal: Utilization of techniques to improve thought processes will improve Outcome: Progressing   Problem: Coping: Goal: Coping ability will improve Outcome: Progressing Goal: Will verbalize feelings Outcome: Progressing   Problem: Coping: Goal: Ability to identify and develop effective coping behavior will improve Outcome: Progressing

## 2019-02-08 NOTE — Progress Notes (Signed)
Patient alert and oriented x 4, affect is flat, brightens upon approach, thoughts are organized and coherent  speech is soft, she appears less anxious, she was offered emotional support and encouraged to use coping skills. Patient is receptive to staff, interacting appropriately with peers and staff, she was complaint with evening medication, 15 minutes safety checks maintained will continue to monitor.

## 2019-02-08 NOTE — Progress Notes (Signed)
Patient is asleep, this writer will administer morning medications once she wakes up. MD was notified.

## 2019-02-08 NOTE — Progress Notes (Signed)
D - Patient was in her room upon arrival to the unit. Patient presents with high anxiety but denies SI/HI/AVH, denies pain. Patient observed interacting appropriately with staff and peers on the unit. Patient stated she was starting to feel better and that she had a good day.   A - Patient compliant with medication administration per MD orders. Patient given education. Patient given support and encouragement to be active in her treatment plan. Patient informed to let staff know if there are any issues or problems on the unit.   R - Patient being monitored Q 15 minutes for safety per unit protocol. Patient remains safe on the unit.

## 2019-02-08 NOTE — Progress Notes (Signed)
Recreation Therapy Notes  Date: 02/08/2019  Time: 9:30 am   Location: Craft room   Behavioral response: N/A   Intervention Topic: Emotions  Discussion/Intervention: Patient did not attend group.   Clinical Observations/Feedback:  Patient did not attend group.   Maeleigh Buschman LRT/CTRS         Mary Hockey 02/08/2019 10:48 AM

## 2019-02-08 NOTE — Plan of Care (Signed)
Patient stated to this writer that she feels better today and was compliant with medications and procedures on the unit.   Problem: Education: Goal: Emotional status will improve Outcome: Progressing Goal: Mental status will improve Outcome: Progressing

## 2019-02-08 NOTE — Progress Notes (Signed)
[facility] MD Progress Note  02/08/2019 1:56 PM VERNEICE REXROAD  MRN:  [id number] Subjective: Patient seen and chart reviewed.  Patient today when I saw her in the afternoon was back to having the physical anxiety symptoms.  She was sitting in her room shaking her legs up and down.  I asked her to come with me to the office which is only about 30 feet away.  She leaned against the wall part of the way and walks with a shuffling gait.  While in the office she kept her eyes closed and when she got up to leave had to be directed to open her eyes back up in order to find her way up to the door.  Tends to talk in circles repeating herself frequently.  Affect looks blunted and anxious.  I am not really sure what is going on and that she changes so much from time to time.  Reviewed medication with her.  She is also now having a cold sore on her lips that I had not noticed before and complains that she is having an outbreak of shingles on her right chest.  I did not take a look at it but she says the pain is really bothering her. Principal Problem: Schizo affective schizophrenia ([place]) Diagnosis: Principal Problem:   Schizo affective schizophrenia ([place])  Total Time spent with patient: 30 minutes  Past Psychiatric History: Patient has a longstanding history of schizoaffective disorder that have been stable for years until medicine changes  Past Medical History:  Past Medical History:  Diagnosis Date  . Allergy   . Anxiety   . Depression   . Hyperlipidemia   . Hypertension   . Osteopenia after menopause   . Persistent headaches   . Pneumonia   . Postherpetic neuralgia   . Renal disorder   . Shingles outbreak 02/2014  . Skin cancer    moles on right groin and right buttock.    Past Surgical History:  Procedure Laterality Date  . CARDIOVASCULAR STRESS TEST  02/2009   treadmill stress test: Low risk  . [place]  . COLONOSCOPY     2014/2015 [place], normal  . EXPLORATORY  LAPAROTOMY     laproscopy for infertility  . EYE SURGERY Bilateral    laser-correct opening b/tn cornea and iris  . MOLE REMOVAL  2017   x 2 moles   Family History:  Family History  Problem Relation Age of Onset  . Hypertension Mother   . Coronary artery disease Mother   . Heart failure Mother   . Hypertension Father   . Liver disease Sister        liver failure  . Arrhythmia Brother   . Hypertension Brother   . Cancer Neg Hx        colon   . Breast cancer Neg Hx    Family Psychiatric  History: See previous Social History:  Social History   Substance and Sexual Activity  Alcohol Use No  . Alcohol/week: 0.0 standard drinks     Social History   Substance and Sexual Activity  Drug Use No    Social History   Socioeconomic History  . Marital status: Legally Separated    Spouse name: Not on file  . Number of children: 2  . Years of education: Not on file  . Highest education level: Not on file  Occupational History  . Occupation: Control and instrumentation engineer  Social Needs  . Financial resource strain: Not on file  .  Food insecurity    Worry: Not on file    Inability: Not on file  . Transportation needs    Medical: Not on file    Non-medical: Not on file  Tobacco Use  . Smoking status: Never Smoker  . Smokeless tobacco: Never Used  Substance and Sexual Activity  . Alcohol use: No    Alcohol/week: 0.0 standard drinks  . Drug use: No  . Sexual activity: Not on file  Lifestyle  . Physical activity    Days per week: Not on file    Minutes per session: Not on file  . Stress: Not on file  Relationships  . Social Herbalist on phone: Not on file    Gets together: Not on file    Attends religious service: Not on file    Active member of club or organization: Not on file    Attends meetings of clubs or organizations: Not on file    Relationship status: Not on file  Other Topics Concern  . Not on file  Social History Narrative   Regular exercise- yes, spins  daily   Diet: fruits and veggies, water    No caffeine   Additional Social History:    Pain Medications: see PTA Prescriptions: see PTA Over the Counter: see PTA                    Sleep: Poor  Appetite:  Fair  Current Medications: Current Facility-Administered Medications  Medication Dose Route Frequency Provider Last Rate Last Dose  . acetaminophen (TYLENOL) tablet 650 mg  650 mg Oral Q6H PRN Cristofano, Dorene Ar, MD   650 mg at 02/02/19 1721  . acyclovir cream (ZOVIRAX) 5 %   Topical Q3H Filemon Breton T, MD      . albuterol (VENTOLIN HFA) 108 (90 Base) MCG/ACT inhaler 2 puff  2 puff Inhalation Q6H PRN Money, Lowry Ram, FNP   2 puff at 02/03/19 1053  . alum & mag hydroxide-simeth (MAALOX/MYLANTA) 200-200-20 MG/5ML suspension 30 mL  30 mL Oral Q4H PRN Cristofano, Paul A, MD      . antiseptic oral rinse (BIOTENE) solution 15 mL  15 mL Mouth Rinse PRN Jewel Venditto T, MD      . aspirin EC tablet 81 mg  81 mg Oral Daily Money, Lowry Ram, FNP   81 mg at 02/08/19 1326  . atorvastatin (LIPITOR) tablet 20 mg  20 mg Oral Daily Money, Lowry Ram, FNP   20 mg at 02/08/19 1326  . beclomethasone (QVAR) 80 MCG/ACT inhaler 1-2 puff  1-2 puff Inhalation BID Suella Broad, FNP   2 puff at 02/08/19 1327  . calcium-vitamin D (OSCAL WITH D) 500-200 MG-UNIT per tablet 1 tablet  1 tablet Oral Daily Money, Lowry Ram, FNP   1 tablet at 02/08/19 1326  . clonazePAM (KLONOPIN) tablet 0.5 mg  0.5 mg Oral BID Tishawna Larouche T, MD      . feeding supplement (ENSURE ENLIVE) (ENSURE ENLIVE) liquid 237 mL  237 mL Oral BID BM Fredonia Casalino T, MD   237 mL at 02/08/19 1100  . gabapentin (NEURONTIN) capsule 300 mg  300 mg Oral TID Money, Darnelle Maffucci B, FNP   300 mg at 02/08/19 1325  . haloperidol (HALDOL) tablet 5 mg  5 mg Oral Q8H PRN Money, Lowry Ram, FNP   5 mg at 02/06/19 0741   Or  . haloperidol lactate (HALDOL) injection 5 mg  5 mg Intramuscular Q8H PRN Money,  Lowry Ram, FNP      . ibuprofen (ADVIL) tablet 600  mg  600 mg Oral Q6H PRN Jerriyah Louis T, MD      . LORazepam (ATIVAN) tablet 0.5 mg  0.5 mg Oral Q6H PRN Money, Lowry Ram, FNP   0.5 mg at 02/06/19 1329   Or  . LORazepam (ATIVAN) injection 1 mg  1 mg Intramuscular Q6H PRN Money, Lowry Ram, FNP   1 mg at 02/05/19 1349  . magnesium hydroxide (MILK OF MAGNESIA) suspension 30 mL  30 mL Oral Daily PRN Cristofano, Dorene Ar, MD      . Melatonin TABS 5 mg  5 mg Oral QHS Money, Lowry Ram, FNP   5 mg at 02/07/19 2127  . montelukast (SINGULAIR) tablet 10 mg  10 mg Oral QHS Money, Lowry Ram, FNP   10 mg at 02/07/19 2127  . polyvinyl alcohol (LIQUIFILM TEARS) 1.4 % ophthalmic solution 1 drop  1 drop Both Eyes BID Lucie Friedlander, Madie Reno, MD   1 drop at 02/08/19 1326  . QUEtiapine (SEROQUEL XR) 24 hr tablet 400 mg  400 mg Oral QHS Roselia Snipe T, MD      . sodium chloride (OCEAN) 0.65 % nasal spray 1 spray  1 spray Each Nare PRN Brewer Hitchman T, MD      . temazepam (RESTORIL) capsule 30 mg  30 mg Oral QHS Tykia Mellone, Madie Reno, MD   30 mg at 02/07/19 2127  . triamterene-hydrochlorothiazide (MAXZIDE-25) 37.5-25 MG per tablet 1 tablet  1 tablet Oral Daily Money, Lowry Ram, FNP   1 tablet at 02/08/19 1325    Lab Results: No results found for this or any previous visit (from the past 48 hour(s)).  Blood Alcohol level:  Lab Results  Component Value Date   ETH <10 01/31/2019   ETH <10 123456    Metabolic Disorder Labs: Lab Results  Component Value Date   HGBA1C 5.3 09/30/2018   MPG 105.41 09/30/2018   No results found for: PROLACTIN Lab Results  Component Value Date   CHOL 158 02/02/2019   TRIG 49 02/02/2019   HDL 62 02/02/2019   CHOLHDL 2.5 02/02/2019   VLDL 10 02/02/2019   LDLCALC 86 02/02/2019   LDLCALC 87 09/30/2018    Physical Findings: AIMS:  , ,  ,  ,    CIWA:    COWS:     Musculoskeletal: Strength & Muscle Tone: within normal limits Gait & Station: unsteady Patient leans: N/A  Psychiatric Specialty Exam: Physical Exam  Nursing note and  vitals reviewed. Constitutional: She appears well-developed and well-nourished.  HENT:  Head: Normocephalic and atraumatic.  Eyes: Pupils are equal, round, and reactive to light. Conjunctivae are normal.  Neck: Normal range of motion.  Cardiovascular: Regular rhythm and normal heart sounds.  Respiratory: Effort normal.  GI: Soft.  Musculoskeletal: Normal range of motion.  Neurological: She is alert.  Skin: Skin is warm and dry.  Psychiatric: Judgment and thought content normal. Her mood appears anxious. Her speech is delayed and tangential. She is slowed.    Review of Systems  Constitutional: Negative.   HENT: Negative.   Eyes: Negative.   Respiratory: Negative.   Cardiovascular: Negative.   Gastrointestinal: Negative.   Musculoskeletal: Negative.   Skin: Negative.   Neurological: Negative.   Psychiatric/Behavioral: Positive for hallucinations. Negative for depression, substance abuse and suicidal ideas. The patient is nervous/anxious.     Blood pressure (!) 148/72, pulse 98, temperature 97.8 F (36.6 C), temperature source Oral,  resp. rate 17, height 5\' 1"  (1.549 m), weight 49.9 kg, SpO2 100 %.Body mass index is 20.78 kg/m.  General Appearance: Fairly Groomed  Eye Contact:  Minimal  Speech:  Slow  Volume:  Decreased  Mood:  Euthymic  Affect:  Constricted and Flat  Thought Process:  NA  Orientation:  Full (Time, Place, and Person)  Thought Content:  Illogical, Hallucinations: Auditory and Rumination  Suicidal Thoughts:  No  Homicidal Thoughts:  No  Memory:  Immediate;   Fair Recent;   Fair Remote;   Fair  Judgement:  Impaired  Insight:  Shallow  Psychomotor Activity:  Mannerisms  Concentration:  Concentration: Fair  Recall:  [employer] of Knowledge:  Fair  Language:  Fair  Akathisia:  No  Handed:  Right  AIMS (if indicated):     Assets:  Desire for Improvement Housing Resilience  ADL's:  Intact  Cognition:  Impaired,  Mild  Sleep:  Number of Hours: 6.5      Treatment Plan Summary: Daily contact with patient to assess and evaluate symptoms and progress in treatment, Medication management and Plan  still remains somewhat puzzling to me.  Symptoms will vary from time to time during the day.  At times it is hard not to think that she may be exaggerating symptoms for affect except that she can also be observed sitting in her room shaking her legs when no one is around.  Patient does not appear to have very much insight are very good understanding.  At other times she is able to participate in groups fairly appropriately.  I reviewed her medicines and I had some concern that she could be oversedated although he has not really looked that she has about her that looks too tired.  Nevertheless I know she used to be on long-acting Seroquel so I am going to consolidate the daytime Seroquel and put it all to 1 dose at night of the extended release.  Still not sleeping all that well so I will back off yet of the Restoril.  Reviewed old notes and there really does not seem to be any clear prior medication that was any more effective than what she is taking.  Tried to talk with her about the overall concerns I have in the treatment plan but by that point she was just sort of talking in circles.  Continue 15-minute checks for now with the patient located near the nursing station.  Blood pressure goes up and down as well.  She had some low blood pressure earlier but then a recheck was back up to medium high so I am not going to change the blood pressure medicine now  Alethia Berthold, MD 02/08/2019, 1:56 PM

## 2019-02-09 MED ORDER — TEMAZEPAM 15 MG PO CAPS
15.0000 mg | ORAL_CAPSULE | Freq: Every day | ORAL | Status: DC
  Administered 2019-02-09 – 2019-02-14 (×6): 15 mg via ORAL
  Filled 2019-02-09 (×6): qty 1

## 2019-02-09 NOTE — Progress Notes (Signed)
[facility] MD Progress Note  02/09/2019 2:39 PM Theresa Martin  MRN:  [id number] Subjective: Follow-up patient seen.  Patient today was in her room neatly dressed and groomed.  She was able to carry on a reasonably lucid conversation although as usual within a few minutes she starts to get more nervous.  She still has shaking in her legs all the time but otherwise not showing any obvious outward signs of anxiety.  She remains fixated on the idea that she needs to have inhalers although she really does not seem to be in any respiratory distress and I reminded her that the albuterol actually makes her more nervous.  She is a little more lucid in her conversation and not bizarre today.  Taking care of her hygiene better.  Sleep is reported as 7 hours last night. Principal Problem: Schizo affective schizophrenia ([place]) Diagnosis: Principal Problem:   Schizo affective schizophrenia ([place])  Total Time spent with patient: 30 minutes  Past Psychiatric History: Patient has a history of schizoaffective disorder with long periods of stability until coming off her medicine  Past Medical History:  Past Medical History:  Diagnosis Date  . Allergy   . Anxiety   . Depression   . Hyperlipidemia   . Hypertension   . Osteopenia after menopause   . Persistent headaches   . Pneumonia   . Postherpetic neuralgia   . Renal disorder   . Shingles outbreak 02/2014  . Skin cancer    moles on right groin and right buttock.    Past Surgical History:  Procedure Laterality Date  . CARDIOVASCULAR STRESS TEST  02/2009   treadmill stress test: Low risk  . [place]  . COLONOSCOPY     2014/2015 [place], normal  . EXPLORATORY LAPAROTOMY     laproscopy for infertility  . EYE SURGERY Bilateral    laser-correct opening b/tn cornea and iris  . MOLE REMOVAL  2017   x 2 moles   Family History:  Family History  Problem Relation Age of Onset  . Hypertension Mother   . Coronary artery disease  Mother   . Heart failure Mother   . Hypertension Father   . Liver disease Sister        liver failure  . Arrhythmia Brother   . Hypertension Brother   . Cancer Neg Hx        colon   . Breast cancer Neg Hx    Family Psychiatric  History: See previous Social History:  Social History   Substance and Sexual Activity  Alcohol Use No  . Alcohol/week: 0.0 standard drinks     Social History   Substance and Sexual Activity  Drug Use No    Social History   Socioeconomic History  . Marital status: Legally Separated    Spouse name: Not on file  . Number of children: 2  . Years of education: Not on file  . Highest education level: Not on file  Occupational History  . Occupation: Control and instrumentation engineer  Social Needs  . Financial resource strain: Not on file  . Food insecurity    Worry: Not on file    Inability: Not on file  . Transportation needs    Medical: Not on file    Non-medical: Not on file  Tobacco Use  . Smoking status: Never Smoker  . Smokeless tobacco: Never Used  Substance and Sexual Activity  . Alcohol use: No    Alcohol/week: 0.0 standard drinks  . Drug  use: No  . Sexual activity: Not on file  Lifestyle  . Physical activity    Days per week: Not on file    Minutes per session: Not on file  . Stress: Not on file  Relationships  . Social Herbalist on phone: Not on file    Gets together: Not on file    Attends religious service: Not on file    Active member of club or organization: Not on file    Attends meetings of clubs or organizations: Not on file    Relationship status: Not on file  Other Topics Concern  . Not on file  Social History Narrative   Regular exercise- yes, spins daily   Diet: fruits and veggies, water    No caffeine   Additional Social History:    Pain Medications: see PTA Prescriptions: see PTA Over the Counter: see PTA                    Sleep: Fair  Appetite:  Fair  Current Medications: Current  Facility-Administered Medications  Medication Dose Route Frequency Provider Last Rate Last Dose  . acetaminophen (TYLENOL) tablet 650 mg  650 mg Oral Q6H PRN Cristofano, Dorene Ar, MD   650 mg at 02/09/19 1236  . acyclovir cream (ZOVIRAX) 5 %   Topical Q3H ,  T, MD      . albuterol (VENTOLIN HFA) 108 (90 Base) MCG/ACT inhaler 2 puff  2 puff Inhalation Q6H PRN Money, Lowry Ram, FNP   2 puff at 02/09/19 1423  . alum & mag hydroxide-simeth (MAALOX/MYLANTA) 200-200-20 MG/5ML suspension 30 mL  30 mL Oral Q4H PRN Cristofano, Paul A, MD      . antiseptic oral rinse (BIOTENE) solution 15 mL  15 mL Mouth Rinse PRN ,  T, MD      . aspirin EC tablet 81 mg  81 mg Oral Daily Money, Lowry Ram, FNP   81 mg at 02/09/19 0857  . atorvastatin (LIPITOR) tablet 20 mg  20 mg Oral Daily Money, Lowry Ram, FNP   20 mg at 02/09/19 0857  . beclomethasone (QVAR) 80 MCG/ACT inhaler 1-2 puff  1-2 puff Inhalation BID Suella Broad, FNP   2 puff at 02/09/19 0855  . calcium-vitamin D (OSCAL WITH D) 500-200 MG-UNIT per tablet 1 tablet  1 tablet Oral Daily Money, Lowry Ram, FNP   1 tablet at 02/09/19 0856  . clonazePAM (KLONOPIN) tablet 0.5 mg  0.5 mg Oral BID , Madie Reno, MD   0.5 mg at 02/09/19 0857  . feeding supplement (ENSURE ENLIVE) (ENSURE ENLIVE) liquid 237 mL  237 mL Oral BID BM ,  T, MD   237 mL at 02/09/19 1100  . gabapentin (NEURONTIN) capsule 300 mg  300 mg Oral TID Money, Lowry Ram, FNP   300 mg at 02/09/19 1236  . haloperidol (HALDOL) tablet 5 mg  5 mg Oral Q8H PRN Money, Lowry Ram, FNP   5 mg at 02/06/19 0741   Or  . haloperidol lactate (HALDOL) injection 5 mg  5 mg Intramuscular Q8H PRN Money, Darnelle Maffucci B, FNP      . ibuprofen (ADVIL) tablet 600 mg  600 mg Oral Q6H PRN , Madie Reno, MD   600 mg at 02/09/19 0857  . LORazepam (ATIVAN) tablet 0.5 mg  0.5 mg Oral Q6H PRN Money, Lowry Ram, FNP   0.5 mg at 02/09/19 0117   Or  . LORazepam (ATIVAN) injection 1 mg  1 mg Intramuscular Q6H  PRN Money, Lowry Ram, FNP   1 mg at 02/05/19 1349  . magnesium hydroxide (MILK OF MAGNESIA) suspension 30 mL  30 mL Oral Daily PRN Cristofano, Dorene Ar, MD      . Melatonin TABS 5 mg  5 mg Oral QHS Money, Lowry Ram, FNP   5 mg at 02/08/19 2118  . montelukast (SINGULAIR) tablet 10 mg  10 mg Oral QHS Money, Lowry Ram, FNP   10 mg at 02/08/19 2117  . polyvinyl alcohol (LIQUIFILM TEARS) 1.4 % ophthalmic solution 1 drop  1 drop Both Eyes BID , Madie Reno, MD   1 drop at 02/09/19 0856  . QUEtiapine (SEROQUEL XR) 24 hr tablet 400 mg  400 mg Oral QHS ,  T, MD   400 mg at 02/08/19 2117  . sodium chloride (OCEAN) 0.65 % nasal spray 1 spray  1 spray Each Nare PRN , Madie Reno, MD   1 spray at 02/09/19 0856  . temazepam (RESTORIL) capsule 15 mg  15 mg Oral QHS ,  T, MD      . triamterene-hydrochlorothiazide (MAXZIDE-25) 37.5-25 MG per tablet 1 tablet  1 tablet Oral Daily Money, Lowry Ram, FNP   1 tablet at 02/09/19 [id number]    Lab Results: No results found for this or any previous visit (from the past 48 hour(s)).  Blood Alcohol level:  Lab Results  Component Value Date   ETH <10 01/31/2019   ETH <10 123456    Metabolic Disorder Labs: Lab Results  Component Value Date   HGBA1C 5.3 09/30/2018   MPG 105.41 09/30/2018   No results found for: PROLACTIN Lab Results  Component Value Date   CHOL 158 02/02/2019   TRIG 49 02/02/2019   HDL 62 02/02/2019   CHOLHDL 2.5 02/02/2019   VLDL 10 02/02/2019   LDLCALC 86 02/02/2019   LDLCALC 87 09/30/2018    Physical Findings: AIMS:  , ,  ,  ,    CIWA:    COWS:     Musculoskeletal: Strength & Muscle Tone: within normal limits Gait & Station: normal Patient leans: N/A  Psychiatric Specialty Exam: Physical Exam  Nursing note and vitals reviewed. Constitutional: She appears well-developed and well-nourished.  HENT:  Head: Normocephalic and atraumatic.  Eyes: Pupils are equal, round, and reactive to light. Conjunctivae are  normal.  Neck: Normal range of motion.  Cardiovascular: Regular rhythm and normal heart sounds.  Respiratory: Effort normal. No respiratory distress.  GI: Soft.  Musculoskeletal: Normal range of motion.  Neurological: She is alert.  Skin: Skin is warm and dry.  Psychiatric: Judgment normal. Her mood appears anxious. Her speech is delayed. She is slowed. Thought content is not paranoid. Cognition and memory are normal. She expresses no homicidal and no suicidal ideation.    Review of Systems  Constitutional: Negative.   HENT: Negative.   Eyes: Negative.   Respiratory: Negative.   Cardiovascular: Negative.   Gastrointestinal: Negative.   Musculoskeletal: Negative.   Skin: Negative.   Neurological: Negative.   Psychiatric/Behavioral: Positive for memory loss. Negative for depression, hallucinations, substance abuse and suicidal ideas. The patient is nervous/anxious. The patient does not have insomnia.     Blood pressure 121/66, pulse 73, temperature 97.8 F (36.6 C), resp. rate 17, height 5\' 1"  (1.549 m), weight 49.9 kg, SpO2 100 %.Body mass index is 20.78 kg/m.  General Appearance: Casual  Eye Contact:  Good  Speech:  Clear and Coherent  Volume:  Normal  Mood:  Euthymic  Affect:  Constricted  Thought Process:  Coherent  Orientation:  Full (Time, Place, and Person)  Thought Content:  Logical and Rumination  Suicidal Thoughts:  No  Homicidal Thoughts:  No  Memory:  Immediate;   Fair Recent;   Fair Remote;   Fair  Judgement:  Fair  Insight:  Fair  Psychomotor Activity:  Normal and Decreased  Concentration:  Concentration: Fair  Recall:  [employer] of Knowledge:  Fair  Language:  Fair  Akathisia:  No  Handed:  Right  AIMS (if indicated):     Assets:  Desire for Improvement Housing Physical Health Resilience  ADL's:  Intact  Cognition:  WNL  Sleep:  Number of Hours: 7     Treatment Plan Summary: Daily contact with patient to assess and evaluate symptoms and  progress in treatment, Medication management and Plan I am going to continue to taper the Restoril at night down to 15 mg.  No other change to her antipsychotic or antidepressant medicine.  Continue monitoring over the weekend and encourage patient that if things stay stable she may be ready to go in a couple days.  Alethia Berthold, MD 02/09/2019, 2:39 PM

## 2019-02-09 NOTE — Progress Notes (Signed)
Patient is sitting in her room, in her chair, looking over photos and tapping her right foot. Patient asked this writer for her PRN inhaler, and it was provided to her. Patient continues to sit in her chair and tap her right foot.

## 2019-02-09 NOTE — [facility] Group Notes (Signed)
Feelings Around Relapse 02/09/2019 1PM  Type of Therapy and Topic:  Group Therapy:  Feelings around Relapse and Recovery  Participation Level:  None   Description of Group:    Patients in this group will discuss emotions they experience before and after a relapse. They will process how experiencing these feelings, or avoidance of experiencing them, relates to having a relapse. Facilitator will guide patients to explore emotions they have related to recovery. Patients will be encouraged to process which emotions are more powerful. They will be guided to discuss the emotional reaction significant others in their lives may have to patients' relapse or recovery. Patients will be assisted in exploring ways to respond to the emotions of others without this contributing to a relapse.  Therapeutic Goals: 1. Patient will identify two or more emotions that lead to a relapse for them 2. Patient will identify two emotions that result when they relapse 3. Patient will identify two emotions related to recovery 4. Patient will demonstrate ability to communicate their needs through discussion and/or role plays   Summary of Patient Progress: Pt came to group late and did not engage in todays session. Pt sat quietly, no input provided.    Therapeutic Modalities:   Cognitive Behavioral Therapy Solution-Focused Therapy Assertiveness Training Relapse Prevention Therapy   Yvette Rack, LCSW 02/09/2019 2:11 PM

## 2019-02-09 NOTE — Progress Notes (Signed)
Recreation Therapy Notes    Date: 02/09/2019  Time: 9:30 am  Location: Craft room  Behavioral response: Appropriate   Intervention Topic: Self-esteem   Discussion/Intervention:   Group content today was focused on self-esteem. Patient defined self-esteem and where it comes form. The group described reasons self-esteem is important. Individuals stated things that impact self-esteem and positive ways to improve self-esteem. The group participated in the intervention "Exploring self-esteem" where patients were able to create a collage of positive things that makes them who they are. Clinical Observations/Feedback:  Patient came to group late due to unknown reasons. Individual was social with peers and staff while participating in group.  Abdulhamid Olgin LRT/CTRS         Martyn Timme 02/09/2019 11:16 AM

## 2019-02-09 NOTE — Plan of Care (Signed)
D- Patient alert and oriented. Patient presents in a pleasant mood on assessment stating that she slept "great" last night and the only complaint that she had was of some confusion, "I can't remember anything, I know I'm in the hospital, but I don't know which one". Patient reported a headache, rating her pain a "9/10", in which she did request pain medication from this Probation officer. Patient denies any signs/symptoms fo depression, however, she rated her anxiety an "8/10", stating that "being afraid" is why she's feeling this way. Patient did not go into detail as to what she is afraid of. Patient also denies SI, HI, AVH, at this time. Patient's goal for today is to "feel better and to go home".  A- Scheduled medications administered to patient, per MD orders. Support and encouragement provided.  Routine safety checks conducted every 15 minutes.  Patient informed to notify staff with problems or concerns.  R- No adverse drug reactions noted. Patient contracts for safety at this time. Patient compliant with medications and treatment plan. Patient receptive, calm, and cooperative. Patient interacts well with others on the unit.  Patient remains safe at this time.  Problem: Education: Goal: Knowledge of [place] [facility] Education information/materials will improve Outcome: Progressing Goal: Emotional status will improve Outcome: Progressing Goal: Mental status will improve Outcome: Progressing Goal: Verbalization of understanding the information provided will improve Outcome: Progressing   Problem: Safety: Goal: Periods of time without injury will increase Outcome: Progressing   Problem: Education: Goal: Utilization of techniques to improve thought processes will improve Outcome: Progressing   Problem: Coping: Goal: Coping ability will improve Outcome: Progressing Goal: Will verbalize feelings Outcome: Progressing   Problem: Coping: Goal: Ability to identify and develop effective coping  behavior will improve Outcome: Progressing

## 2019-02-10 MED ORDER — GABAPENTIN 400 MG PO CAPS
400.0000 mg | ORAL_CAPSULE | Freq: Three times a day (TID) | ORAL | Status: DC
  Administered 2019-02-10 – 2019-02-15 (×15): 400 mg via ORAL
  Filled 2019-02-10 (×15): qty 1

## 2019-02-10 NOTE — [facility] Group Notes (Signed)
[place] Group Notes:  (Nursing/MHT/Case Management/Adjunct)  Date:  02/10/2019  Time:  9:00 PM  Type of Therapy:  Group Therapy  Participation Level:  Did Not Attend  Summary of Progress/Problems:  Theresa Martin 02/10/2019, 9:00 PM

## 2019-02-10 NOTE — Plan of Care (Signed)
Patient denies SI/HI/AVH. Patient is frequently at nurses station with somatic complaints. Patient is frequently complaining of restless legs. Patient is seen in milieu but isolates to self. Patient's safety is maintained with safety rounds.    Problem: Education: Goal: Knowledge of  General Education information/materials will improve Outcome: Not Progressing Goal: Emotional status will improve Outcome: Not Progressing Goal: Mental status will improve Outcome: Not Progressing

## 2019-02-10 NOTE — Tx Team (Signed)
Interdisciplinary Treatment and Diagnostic Plan Update  02/10/2019 Time of Session: 8:30AM Theresa Martin MRN: [id number]  Principal Diagnosis: Schizo affective schizophrenia [facility])  Secondary Diagnoses: Principal Problem:   Schizo affective schizophrenia ([place])   Current Medications:  Current Facility-Administered Medications  Medication Dose Route Frequency Provider Last Rate Last Dose  . acetaminophen (TYLENOL) tablet 650 mg  650 mg Oral Q6H PRN Cristofano, Dorene Ar, MD   650 mg at 02/09/19 1236  . acyclovir ointment (ZOVIRAX) 5 %   Topical Q3H Clapacs, John T, MD      . albuterol (VENTOLIN HFA) 108 (90 Base) MCG/ACT inhaler 2 puff  2 puff Inhalation Q6H PRN Money, Lowry Ram, FNP   2 puff at 02/09/19 1423  . alum & mag hydroxide-simeth (MAALOX/MYLANTA) 200-200-20 MG/5ML suspension 30 mL  30 mL Oral Q4H PRN Cristofano, Paul A, MD      . antiseptic oral rinse (BIOTENE) solution 15 mL  15 mL Mouth Rinse PRN Clapacs, John T, MD      . aspirin EC tablet 81 mg  81 mg Oral Daily Money, Lowry Ram, FNP   81 mg at 02/10/19 0946  . atorvastatin (LIPITOR) tablet 20 mg  20 mg Oral Daily Money, Lowry Ram, FNP   20 mg at 02/10/19 0947  . beclomethasone (QVAR) 80 MCG/ACT inhaler 1-2 puff  1-2 puff Inhalation BID Suella Broad, FNP   2 puff at 02/10/19 0947  . calcium-vitamin D (OSCAL WITH D) 500-200 MG-UNIT per tablet 1 tablet  1 tablet Oral Daily Money, Lowry Ram, FNP   1 tablet at 02/10/19 0947  . clonazePAM (KLONOPIN) tablet 0.5 mg  0.5 mg Oral BID Clapacs, Madie Reno, MD   0.5 mg at 02/10/19 0946  . feeding supplement (ENSURE ENLIVE) (ENSURE ENLIVE) liquid 237 mL  237 mL Oral BID BM Clapacs, Madie Reno, MD   237 mL at 02/10/19 0948  . gabapentin (NEURONTIN) capsule 300 mg  300 mg Oral TID Money, Lowry Ram, FNP   300 mg at 02/10/19 0946  . haloperidol (HALDOL) tablet 5 mg  5 mg Oral Q8H PRN Money, Lowry Ram, FNP   5 mg at 02/06/19 0741   Or  . haloperidol lactate (HALDOL) injection 5 mg  5 mg  Intramuscular Q8H PRN Money, Darnelle Maffucci B, FNP      . ibuprofen (ADVIL) tablet 600 mg  600 mg Oral Q6H PRN Clapacs, Madie Reno, MD   600 mg at 02/09/19 0857  . LORazepam (ATIVAN) tablet 0.5 mg  0.5 mg Oral Q6H PRN Money, Lowry Ram, FNP   0.5 mg at 02/09/19 0117   Or  . LORazepam (ATIVAN) injection 1 mg  1 mg Intramuscular Q6H PRN Money, Lowry Ram, FNP   1 mg at 02/05/19 1349  . magnesium hydroxide (MILK OF MAGNESIA) suspension 30 mL  30 mL Oral Daily PRN Cristofano, Dorene Ar, MD      . Melatonin TABS 5 mg  5 mg Oral QHS Money, Lowry Ram, FNP   5 mg at 02/09/19 2133  . montelukast (SINGULAIR) tablet 10 mg  10 mg Oral QHS Money, Lowry Ram, FNP   10 mg at 02/09/19 2137  . polyvinyl alcohol (LIQUIFILM TEARS) 1.4 % ophthalmic solution 1 drop  1 drop Both Eyes BID Clapacs, Madie Reno, MD   1 drop at 02/10/19 0947  . QUEtiapine (SEROQUEL XR) 24 hr tablet 400 mg  400 mg Oral QHS Clapacs, John T, MD   400 mg at 02/09/19 2134  . sodium  chloride (OCEAN) 0.65 % nasal spray 1 spray  1 spray Each Nare PRN Clapacs, Madie Reno, MD   1 spray at 02/10/19 0947  . temazepam (RESTORIL) capsule 15 mg  15 mg Oral QHS Clapacs, Madie Reno, MD   15 mg at 02/09/19 2133  . triamterene-hydrochlorothiazide (MAXZIDE-25) 37.5-25 MG per tablet 1 tablet  1 tablet Oral Daily Money, Lowry Ram, FNP   1 tablet at 02/10/19 2831   PTA Medications: Medications Prior to Admission  Medication Sig Dispense Refill Last Dose  . albuterol (PROVENTIL HFA) 108 (90 Base) MCG/ACT inhaler Inhale into the lungs every 6 (six) hours as needed for wheezing or shortness of breath.     Marland Kitchen aspirin EC 81 MG tablet Take 81 mg by mouth daily.     Marland Kitchen atorvastatin (LIPITOR) 20 MG tablet TAKE 1 TABLET BY MOUTH EVERY DAY (Patient taking differently: Take 20 mg by mouth daily. ) 30 tablet 6   . beclomethasone (QVAR) 80 MCG/ACT inhaler Inhale 1-2 puffs into the lungs 2 (two) times daily. 10.6 g 10   . Calcium-Vitamin D-Vitamin K (CALCIUM + D + K PO) Take 3 each by mouth daily. Calcium  55m- Vitamin D 1000iu- K 435m     . clonazePAM (KLONOPIN) 0.5 MG tablet Take 0.25 mg by mouth at bedtime as needed for anxiety.   2   . ELDERBERRY PO Take 1 capsule by mouth daily.      . Marland Kitchenscitalopram (LEXAPRO) 10 MG tablet Take 10 mg by mouth daily.      . Marland Kitchenabapentin (NEURONTIN) 100 MG capsule Take 1-3 capsules (100-300 mg total) by mouth 2 (two) times daily. (Patient taking differently: Take 300 mg by mouth at bedtime. ) 180 capsule 12   . Glucosamine-Chondroitin (MOVE FREE PO) Take 1 tablet by mouth daily.     . Marland Kitchenetoconazole-Hydrocortisone 2 & 1 % KIT Apply topically as needed. Cracks on mouth     . lamoTRIgine (LAMICTAL) 100 MG tablet Take 100 mg by mouth daily.      . Melatonin 5 MG CAPS Take 1 capsule (5 mg total) by mouth at bedtime. 30 capsule 0   . montelukast (SINGULAIR) 10 MG tablet Take 1 tablet (10 mg total) by mouth at bedtime. 30 tablet 3   . Naproxen Sodium (ALEVE) 220 MG CAPS As needed for headache (Patient taking differently: Take 220 mg by mouth daily as needed (headache). ) 60 each    . QUEtiapine (SEROQUEL) 100 MG tablet Take 100 mg by mouth 3 (three) times daily. Pt takes 1 tablet in the AM and 2 tablets in the PM     . triamterene-hydrochlorothiazide (MAXZIDE-25) 37.5-25 MG tablet TAKE 1 TABLET BY MOUTH EVERY DAY (Patient taking differently: Take 1 tablet by mouth daily. 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   Physician Treatment Plan for Primary Diagnosis: Schizo affective schizophrenia ([place] Goal(s): Improvement in symptoms so as ready for discharge Improvement in symptoms so as ready for discharge   Short Term Goals: Ability to verbalize feelings will improve Ability to  demonstrate self-control will improve Ability to identify and develop effective coping behaviors will improve Ability to maintain clinical measurements within normal limits will improve Compliance with prescribed medications will improve Ability to verbalize feelings will improve Ability  to demonstrate self-control will improve Ability to identify and develop effective coping behaviors will improve Ability to maintain clinical measurements within normal limits will improve Compliance with prescribed medications will improve  Medication Management: Evaluate patient's response, side effects, and tolerance of medication regimen.  Therapeutic Interventions: 1 to 1 sessions, Unit Group sessions and Medication administration.  Evaluation of Outcomes: Not Progressing  Physician Treatment Plan for Secondary Diagnosis: Principal Problem:   Schizo affective schizophrenia ([place])  Long Term Goal(s): Improvement in symptoms so as ready for discharge Improvement in symptoms so as ready for discharge   Short Term Goals: Ability to verbalize feelings will improve Ability to demonstrate self-control will improve Ability to identify and develop effective coping behaviors will improve Ability to maintain clinical measurements within normal limits will improve Compliance with prescribed medications will improve Ability to verbalize feelings will improve Ability to demonstrate self-control will improve Ability to identify and develop effective coping behaviors will improve Ability to maintain clinical measurements within normal limits will improve Compliance with prescribed medications will improve     Medication Management: Evaluate patient's response, side effects, and tolerance of medication regimen.  Therapeutic Interventions: 1 to 1 sessions, Unit Group sessions and Medication administration.  Evaluation of Outcomes: Not Met   RN Treatment Plan for Primary Diagnosis: Schizo affective  schizophrenia ([place]) Long Term Goal(s): Knowledge of disease and therapeutic regimen to maintain health will improve  Short Term Goals: Ability to demonstrate self-control, Ability to participate in decision making will improve, Ability to verbalize feelings will improve, Ability to disclose and discuss suicidal ideas, Ability to identify and develop effective coping behaviors will improve and Compliance with prescribed medications will improve  Medication Management: RN will administer medications as ordered by provider, will assess and evaluate patient's response and provide education to patient for prescribed medication. RN will report any adverse and/or side effects to prescribing provider.  Therapeutic Interventions: 1 on 1 counseling sessions, Psychoeducation, Medication administration, Evaluate responses to treatment, Monitor vital signs and CBGs as ordered, Perform/monitor CIWA, COWS, AIMS and Fall Risk screenings as ordered, Perform wound care treatments as ordered.  Evaluation of Outcomes: Not Met   LCSW Treatment Plan for Primary Diagnosis: Schizo affective schizophrenia ([place]) Long Term Goal(s): Safe transition to appropriate next level of care at discharge, Engage patient in therapeutic group addressing interpersonal concerns.  Short Term Goals: Engage patient in aftercare planning with referrals and resources, Increase social support, Increase ability to appropriately verbalize feelings, Increase emotional regulation, Facilitate acceptance of mental health diagnosis and concerns and Increase skills for wellness and recovery  Therapeutic Interventions: Assess for all discharge needs, 1 to 1 time with Social worker, Explore available resources and support systems, Assess for adequacy in community support network, Educate family and significant other(s) on suicide prevention, Complete Psychosocial Assessment, Interpersonal group therapy.  Evaluation of Outcomes: Not Met   Progress in  Treatment: Attending groups: No. Participating in groups: No. Taking medication as prescribed: Yes. Toleration medication: Yes. Family/Significant other contact made: Yes, individual(s) contacted:  SPE completed withthe patient's brother.  Patient understands diagnosis: No. Discussing patient identified problems/goals with staff: Yes. Medical problems stabilized or resolved: Yes. Denies suicidal/homicidal ideation: Yes. Issues/concerns per patient self-inventory: No. Other: none  New problem(s) identified: No, Describe:  none  New Short Term/Long Term Goal(s): elimination of symptoms of psychosis, medication management for mood stabilization; elimination of SI thoughts; development of comprehensive mental wellness plan.  Patient Goals:  "to go home"  Discharge Plan or Barriers: Patient reports plans  to return to her home.  Patient sees a Quarry manager at [facility].  Patient reports a desire to continue with them.  Update 02/10/19- Pt not adequate for discharge, still displaying sxs. Pt will be scheduled for follow up at [facility] when discharge date established.  Reason for Continuation of Hospitalization: Anxiety Depression Medication stabilization  Estimated Length of Stay: TBD   Recreational Therapy: Patient: N/A Patient Goal: Patient will engage in groups without prompting or encouragement from LRT x3 group sessions within 5 recreation therapy group sessions  Attendees: Patient:  02/10/2019 10:32 AM  Physician: Dr. Mallie Darting, MD 02/10/2019 10:32 AM  Nursing:  02/10/2019 10:32 AM  RN Care Manager: 02/10/2019 10:32 AM  Social Worker: Sanjuana Kava, LCSW 02/10/2019 10:32 AM  Recreational Therapist:  02/10/2019 10:32 AM  Other:  02/10/2019 10:32 AM  Other:  02/10/2019 10:32 AM  Other: 02/10/2019 10:32 AM    Scribe for Treatment Team: Yvette Rack, LCSW 02/10/2019 10:32 AM

## 2019-02-10 NOTE — Progress Notes (Signed)
[facility] MD Progress Note  02/10/2019 12:34 PM CLINIQUE SCHAPPERT  MRN:  [id number] Subjective: Patient is a 63 year old female admitted on 02/01/2019 after her brother brought her to the hospital in a manic state.  She was not eating or sleeping.  She was admitted with concerns for bipolar disorder.  She does have a past psychiatric history for schizo affective disorder versus schizophrenia  Objective: Patient is seen and examined.  Patient is a 63 year old female with the above-stated past psychiatric history who is seen in follow-up.  Review of the electronic medical record reveals that yesterday she appeared to be anxious during the discussion.  At that time she was fixated on her inhalers.  Today on examination her eye contact is poor.  She rubs her legs continually during the interview.  She stated that she is having restless leg type symptoms.  Her eye contact is poor.  She denied auditory hallucinations, but discussed the fact that she heard things through the wall during the night that woke her up.  Her vital signs are stable, she is afebrile.  Nursing notes reflect that she slept 6 hours last night.  Her medications include clonazepam, gabapentin, melatonin, Seroquel XR and temazepam.  Review of her laboratories revealed a low sodium, low potassium and a decreased chloride.  Her CBC was essentially normal.  TSH was normal at 1.244.  Urinalysis showed 20 mg per DL of ketones but no bacteria.  She denied suicidal or homicidal ideation.  She seems very somatic.  Principal Problem: Schizo affective schizophrenia ([place]) Diagnosis: Principal Problem:   Schizo affective schizophrenia ([place])  Total Time spent with patient: 20 minutes  Past Psychiatric History: See admission H&P  Past Medical History:  Past Medical History:  Diagnosis Date  . Allergy   . Anxiety   . Depression   . Hyperlipidemia   . Hypertension   . Osteopenia after menopause   . Persistent headaches   . Pneumonia   .  Postherpetic neuralgia   . Renal disorder   . Shingles outbreak 02/2014  . Skin cancer    moles on right groin and right buttock.    Past Surgical History:  Procedure Laterality Date  . CARDIOVASCULAR STRESS TEST  02/2009   treadmill stress test: Low risk  . [place]  . COLONOSCOPY     2014/2015 [place], normal  . EXPLORATORY LAPAROTOMY     laproscopy for infertility  . EYE SURGERY Bilateral    laser-correct opening b/tn cornea and iris  . MOLE REMOVAL  2017   x 2 moles   Family History:  Family History  Problem Relation Age of Onset  . Hypertension Mother   . Coronary artery disease Mother   . Heart failure Mother   . Hypertension Father   . Liver disease Sister        liver failure  . Arrhythmia Brother   . Hypertension Brother   . Cancer Neg Hx        colon   . Breast cancer Neg Hx    Family Psychiatric  History: See admission H&P Social History:  Social History   Substance and Sexual Activity  Alcohol Use No  . Alcohol/week: 0.0 standard drinks     Social History   Substance and Sexual Activity  Drug Use No    Social History   Socioeconomic History  . Marital status: Legally Separated    Spouse name: Not on file  . Number of children: 2  .  Years of education: Not on file  . Highest education level: Not on file  Occupational History  . Occupation: Control and instrumentation engineer  Social Needs  . Financial resource strain: Not on file  . Food insecurity    Worry: Not on file    Inability: Not on file  . Transportation needs    Medical: Not on file    Non-medical: Not on file  Tobacco Use  . Smoking status: Never Smoker  . Smokeless tobacco: Never Used  Substance and Sexual Activity  . Alcohol use: No    Alcohol/week: 0.0 standard drinks  . Drug use: No  . Sexual activity: Not on file  Lifestyle  . Physical activity    Days per week: Not on file    Minutes per session: Not on file  . Stress: Not on file  Relationships  . Social  Herbalist on phone: Not on file    Gets together: Not on file    Attends religious service: Not on file    Active member of club or organization: Not on file    Attends meetings of clubs or organizations: Not on file    Relationship status: Not on file  Other Topics Concern  . Not on file  Social History Narrative   Regular exercise- yes, spins daily   Diet: fruits and veggies, water    No caffeine   Additional Social History:    Pain Medications: see PTA Prescriptions: see PTA Over the Counter: see PTA                    Sleep: Good  Appetite:  Fair  Current Medications: Current Facility-Administered Medications  Medication Dose Route Frequency Provider Last Rate Last Dose  . acetaminophen (TYLENOL) tablet 650 mg  650 mg Oral Q6H PRN Cristofano, Dorene Ar, MD   650 mg at 02/09/19 1236  . acyclovir ointment (ZOVIRAX) 5 %   Topical Q3H Clapacs, John T, MD      . albuterol (VENTOLIN HFA) 108 (90 Base) MCG/ACT inhaler 2 puff  2 puff Inhalation Q6H PRN Money, Lowry Ram, FNP   2 puff at 02/09/19 1423  . alum & mag hydroxide-simeth (MAALOX/MYLANTA) 200-200-20 MG/5ML suspension 30 mL  30 mL Oral Q4H PRN Cristofano, Paul A, MD      . antiseptic oral rinse (BIOTENE) solution 15 mL  15 mL Mouth Rinse PRN Clapacs, John T, MD      . aspirin EC tablet 81 mg  81 mg Oral Daily Money, Lowry Ram, FNP   81 mg at 02/10/19 0946  . atorvastatin (LIPITOR) tablet 20 mg  20 mg Oral Daily Money, Lowry Ram, FNP   20 mg at 02/10/19 0947  . beclomethasone (QVAR) 80 MCG/ACT inhaler 1-2 puff  1-2 puff Inhalation BID Suella Broad, FNP   2 puff at 02/10/19 0947  . calcium-vitamin D (OSCAL WITH D) 500-200 MG-UNIT per tablet 1 tablet  1 tablet Oral Daily Money, Lowry Ram, FNP   1 tablet at 02/10/19 0947  . clonazePAM (KLONOPIN) tablet 0.5 mg  0.5 mg Oral BID Clapacs, Madie Reno, MD   0.5 mg at 02/10/19 0946  . feeding supplement (ENSURE ENLIVE) (ENSURE ENLIVE) liquid 237 mL  237 mL Oral BID BM  Clapacs, Madie Reno, MD   237 mL at 02/10/19 0948  . gabapentin (NEURONTIN) capsule 300 mg  300 mg Oral TID Money, Lowry Ram, FNP   300 mg at 02/10/19 0946  . haloperidol (  HALDOL) tablet 5 mg  5 mg Oral Q8H PRN Money, Lowry Ram, FNP   5 mg at 02/06/19 0741   Or  . haloperidol lactate (HALDOL) injection 5 mg  5 mg Intramuscular Q8H PRN Money, Darnelle Maffucci B, FNP      . ibuprofen (ADVIL) tablet 600 mg  600 mg Oral Q6H PRN Clapacs, Madie Reno, MD   600 mg at 02/09/19 0857  . LORazepam (ATIVAN) tablet 0.5 mg  0.5 mg Oral Q6H PRN Money, Lowry Ram, FNP   0.5 mg at 02/09/19 0117   Or  . LORazepam (ATIVAN) injection 1 mg  1 mg Intramuscular Q6H PRN Money, Lowry Ram, FNP   1 mg at 02/05/19 1349  . magnesium hydroxide (MILK OF MAGNESIA) suspension 30 mL  30 mL Oral Daily PRN Cristofano, Dorene Ar, MD      . Melatonin TABS 5 mg  5 mg Oral QHS Money, Lowry Ram, FNP   5 mg at 02/09/19 2133  . montelukast (SINGULAIR) tablet 10 mg  10 mg Oral QHS Money, Lowry Ram, FNP   10 mg at 02/09/19 2137  . polyvinyl alcohol (LIQUIFILM TEARS) 1.4 % ophthalmic solution 1 drop  1 drop Both Eyes BID Clapacs, Madie Reno, MD   1 drop at 02/10/19 0947  . QUEtiapine (SEROQUEL XR) 24 hr tablet 400 mg  400 mg Oral QHS Clapacs, John T, MD   400 mg at 02/09/19 2134  . sodium chloride (OCEAN) 0.65 % nasal spray 1 spray  1 spray Each Nare PRN Clapacs, Madie Reno, MD   1 spray at 02/10/19 0947  . temazepam (RESTORIL) capsule 15 mg  15 mg Oral QHS Clapacs, Madie Reno, MD   15 mg at 02/09/19 2133  . triamterene-hydrochlorothiazide (MAXZIDE-25) 37.5-25 MG per tablet 1 tablet  1 tablet Oral Daily Money, Lowry Ram, FNP   1 tablet at 02/10/19 [id number]    Lab Results: No results found for this or any previous visit (from the past 48 hour(s)).  Blood Alcohol level:  Lab Results  Component Value Date   ETH <10 01/31/2019   ETH <10 123456    Metabolic Disorder Labs: Lab Results  Component Value Date   HGBA1C 5.3 09/30/2018   MPG 105.41 09/30/2018   No results found  for: PROLACTIN Lab Results  Component Value Date   CHOL 158 02/02/2019   TRIG 49 02/02/2019   HDL 62 02/02/2019   CHOLHDL 2.5 02/02/2019   VLDL 10 02/02/2019   LDLCALC 86 02/02/2019   LDLCALC 87 09/30/2018    Physical Findings: AIMS:  , ,  ,  ,    CIWA:    COWS:     Musculoskeletal: Strength & Muscle Tone: within normal limits Gait & Station: normal Patient leans: N/A  Psychiatric Specialty Exam: Physical Exam  Nursing note and vitals reviewed. Constitutional: She appears well-developed and well-nourished.  HENT:  Head: Normocephalic and atraumatic.  Respiratory: Effort normal.  Neurological: She is alert.    ROS  Blood pressure 132/77, pulse 75, temperature 97.8 F (36.6 C), resp. rate 17, height 5\' 1"  (1.549 m), weight 49.9 kg, SpO2 100 %.Body mass index is 20.78 kg/m.  General Appearance: Casual  Eye Contact:  Poor  Speech:  Normal Rate  Volume:  Normal  Mood:  Anxious and Dysphoric  Affect:  Constricted  Thought Process:  Coherent and Descriptions of Associations: Intact  Orientation:  Full (Time, Place, and Person)  Thought Content:  Delusions and Hallucinations: Auditory  Suicidal Thoughts:  No  Homicidal Thoughts:  No  Memory:  Immediate;   Fair Recent;   Fair Remote;   Fair  Judgement:  Impaired  Insight:  Lacking  Psychomotor Activity:  Increased  Concentration:  Concentration: Fair and Attention Span: Fair  Recall:  [employer] of Knowledge:  Fair  Language:  Fair  Akathisia:  Negative  Handed:  Right  AIMS (if indicated):     Assets:  Desire for Improvement Resilience  ADL's:  Intact  Cognition:  WNL  Sleep:  Number of Hours: 6     Treatment Plan Summary: Daily contact with patient to assess and evaluate symptoms and progress in treatment, Medication management and Plan : Patient is seen and examined.  Patient is a 63 year old female with the above-stated past psychiatric history who is seen in follow-up.   Diagnosis: #1 schizoaffective  disorder; bipolar type versus schizophrenia versus bipolar disorder; most recently manic with psychotic features, #2 hypertension, #3 migraine headaches, #4 asthma, #5 osteopenia, #6 angle-closure glaucoma  Patient is seen in follow-up.  She is still very somatic and focused on what she sees is restless leg issues.  It is unclear if this is akathisia, but given she is on Seroquel the risk would be lower.  She is receiving gabapentin 300 mg p.o. 3 times daily as well as clonazepam 0.5 mg p.o. twice daily which should be effective for restless leg syndrome.  I will increase her gabapentin to 400 mg p.o. 3 times daily, and hopefully that will also give some preventative help towards her migraines.  She remains on Maxide for her hypertension, and most likely that is the cause of her electrolyte abnormalities.  I will recheck her electrolytes in the morning to make sure they are normalizing.  Her vital signs are stable, she is afebrile so no other changes in her medicines.  Hopefully this will help. 1.  Continue albuterol inhaler as needed for wheezing. 2.  Continue acyclovir cream to affected areas for herpes. 3.  Continue coated aspirin 81 mg p.o. daily for heart health. 4.  Continue Lipitor 20 mg p.o. daily for hyperlipidemia. 5.  Continue Qvar daily for COPD. 6.  Continue calcium/vitamin D daily for osteoporosis help. 7.  Continue clonazepam 0.5 mg p.o. twice daily for anxiety and restless leg syndrome. 8.  Increase gabapentin to 400 mg p.o. 3 times daily for anxiety and restless leg syndrome. 9.  Continue melatonin 5 mg p.o. nightly for insomnia. 10.  Continue Singulair 10 mg p.o. nightly for asthma symptoms. 11.  Continue Seroquel XR 400 mg p.o. nightly for mood stability and sleep. 12.  Continue temazepam 15 mg p.o. nightly for insomnia. 13.  Continue Maxide-25 1 tablet p.o. daily for hypertension. 14.  Repeat electrolyte panel in a.m. tomorrow. 15.  Disposition planning-in progress.  Sharma Covert, MD 02/10/2019, 12:34 PM

## 2019-02-10 NOTE — Progress Notes (Signed)
Patient alert and oriented x 4, affect is bright upon approach, thoughts are organized and coherent, her speech is soft, sheappearslessanxious and receptive to staff. Patient was  offered emotional support and encouraged to use coping skills. Patient is interacting appropriately with peers and staff, shewas complaint with eveningmedication, no loud outburst or bizarre behavior. 15 minutes safety checks maintained will continue to monitor

## 2019-02-10 NOTE — Plan of Care (Signed)
  Problem: Coping: Goal: Will verbalize feelings Outcome: Progressing  Patient verbalized feelings to staff, concerning her feeling less anxious and using cooping skills.

## 2019-02-11 LAB — BASIC METABOLIC PANEL
Anion gap: 9 (ref 5–15)
BUN: 16 mg/dL (ref 8–23)
CO2: 29 mmol/L (ref 22–32)
Calcium: 9.1 mg/dL (ref 8.9–10.3)
Chloride: 99 mmol/L (ref 98–111)
Creatinine, Ser: 0.57 mg/dL (ref 0.44–1.00)
GFR calc Af Amer: >60 mL/min (ref >60)
GFR calc non Af Amer: >60 mL/min (ref >60)
Glucose, Bld: 114 mg/dL — ABNORMAL HIGH (ref 70–99)
Potassium: 3.5 mmol/L (ref 3.5–5.1)
Sodium: 137 mmol/L (ref 135–145)

## 2019-02-11 MED ORDER — PANTOPRAZOLE SODIUM 40 MG PO TBEC
40.0000 mg | DELAYED_RELEASE_TABLET | Freq: Every day | ORAL | Status: DC
  Administered 2019-02-11 – 2019-02-15 (×5): 40 mg via ORAL
  Filled 2019-02-11 (×5): qty 1

## 2019-02-11 MED ORDER — NAPROXEN 375 MG PO TABS
375.0000 mg | ORAL_TABLET | Freq: Two times a day (BID) | ORAL | Status: DC | PRN
  Administered 2019-02-11 – 2019-02-14 (×4): 375 mg via ORAL
  Filled 2019-02-11 (×5): qty 1

## 2019-02-11 NOTE — Plan of Care (Signed)
Patient is pleasant upon approach but appear to be responding to internal stimuli , takes her routine medications with out  noticeable side effects , patient reports depressions 5/10, support and education is provided, coping skills are adequate , denies SI/HI/AVH , no thoughts of harm to self, has not requested PRNs , patient sleep through the night with out any issues only requires routine visual checks for safety no distress.   Problem: Education: Goal: Knowledge of [place] [facility] Education information/materials will improve Outcome: Progressing Goal: Emotional status will improve Outcome: Progressing Goal: Mental status will improve Outcome: Progressing Goal: Verbalization of understanding the information provided will improve Outcome: Progressing   Problem: Safety: Goal: Periods of time without injury will increase Outcome: Progressing   Problem: Education: Goal: Utilization of techniques to improve thought processes will improve Outcome: Progressing   Problem: Coping: Goal: Coping ability will improve Outcome: Progressing Goal: Will verbalize feelings Outcome: Progressing   Problem: Coping: Goal: Ability to identify and develop effective coping behavior will improve Outcome: Progressing

## 2019-02-11 NOTE — [facility] Group Notes (Signed)
LCSW Group Therapy Note 02/11/2019 1:15pm  Type of Therapy and Topic: Group Therapy: Feelings Around Returning Home & Establishing a Supportive Framework and Supporting Oneself When Supports Not Available  Participation Level: Did Not Attend  Description of Group:  Patients first processed thoughts and feelings about upcoming discharge. These included fears of upcoming changes, lack of change, new living environments, judgements and expectations from others and overall stigma of mental health issues. The group then discussed the definition of a supportive framework, what that looks and feels like, and how do to discern it from an unhealthy non-supportive network. The group identified different types of supports as well as what to do when your family/friends are less than helpful or unavailable  Therapeutic Goals  1. Patient will identify one healthy supportive network that they can use at discharge. 2. Patient will identify one factor of a supportive framework and how to tell it from an unhealthy network. 3. Patient able to identify one coping skill to use when they do not have positive supports from others. 4. Patient will demonstrate ability to communicate their needs through discussion and/or role plays.  Summary of Patient Progress:  Pt was invited to attend group but chose not to attend. CSW will continue to encourage pt to attend group throughout their admission.   Therapeutic Modalities Cognitive Behavioral Therapy Motivational Interviewing   [place] [employer]  CUEBAS-COLON, LCSW 02/11/2019 12:22 PM

## 2019-02-11 NOTE — Plan of Care (Signed)
Patient took her PM  medicines with out any noticeable side effects, and has been compliant with therapy and did not display any shaking syndrome with her legs or hands , patient is calm and restful in her room, education and support is provided , did not request any PRN , appetite is good , mood is appropriate and affect is normal , tolerating her medication with good effect. Denies any SI/HI/AVH and 15 minutes safety check is maintained no distress.   Problem: Education: Goal: Knowledge of [place] [facility] Education information/materials will improve Outcome: Progressing Goal: Emotional status will improve Outcome: Progressing Goal: Mental status will improve Outcome: Progressing Goal: Verbalization of understanding the information provided will improve Outcome: Progressing   Problem: Safety: Goal: Periods of time without injury will increase Outcome: Progressing   Problem: Education: Goal: Utilization of techniques to improve thought processes will improve Outcome: Progressing   Problem: Coping: Goal: Coping ability will improve Outcome: Progressing Goal: Will verbalize feelings Outcome: Progressing   Problem: Coping: Goal: Ability to identify and develop effective coping behavior will improve Outcome: Progressing

## 2019-02-11 NOTE — Progress Notes (Signed)
[facility] MD Progress Note  02/11/2019 10:23 AM DHAMAR HYNDS  MRN:  [id number] Subjective:  Patient is a 63 year old female admitted on 02/01/2019 after her brother brought her to the hospital in a manic state.  She was not eating or sleeping.  She was admitted with concerns for bipolar disorder.  She does have a past psychiatric history for schizo affective disorder versus schizophrenia  Objective: Patient is seen and examined.  Patient is a 64 year old female with the above-stated past psychiatric history seen in follow-up.  She actually looks a little bit better this morning.  She is able to smile and engage.  She still remains quite somatic.  Her sleep remained adequate with 5.75 hours.  She slept 6 hours the night before.  Yesterday when I came in to see her she was sitting in her bed rubbing her legs, and she is less focused on that.  After we bring up the symptoms she was having she began to discuss how the feelings go from her legs into her hands leading to a tremor and then radiate into her back and to the back of her neck leading to a migraine headache.  I explained to her that the increase in the gabapentin that we did yesterday would assist in migraine headache prevention.  On admission she had several abnormalities of her electrolytes.  We repeated those labs today.  The problems with her sodium and potassium have resolved.  Her blood sugar was 114.  She denied any auditory or visual hallucinations today.  She stated the only thing she heard last night was the air conditioner noise.  She denied any auditory or visual hallucinations.  Principal Problem: Schizo affective schizophrenia ([place]) Diagnosis: Principal Problem:   Schizo affective schizophrenia ([place])  Total Time spent with patient: 15 minutes  Past Psychiatric History: See admission H&P  Past Medical History:  Past Medical History:  Diagnosis Date  . Allergy   . Anxiety   . Depression   . Hyperlipidemia   . Hypertension   .  Osteopenia after menopause   . Persistent headaches   . Pneumonia   . Postherpetic neuralgia   . Renal disorder   . Shingles outbreak 02/2014  . Skin cancer    moles on right groin and right buttock.    Past Surgical History:  Procedure Laterality Date  . CARDIOVASCULAR STRESS TEST  02/2009   treadmill stress test: Low risk  . Elias-Fela Solis  . COLONOSCOPY     2014/2015 [place], normal  . EXPLORATORY LAPAROTOMY     laproscopy for infertility  . EYE SURGERY Bilateral    laser-correct opening b/tn cornea and iris  . MOLE REMOVAL  2017   x 2 moles   Family History:  Family History  Problem Relation Age of Onset  . Hypertension Mother   . Coronary artery disease Mother   . Heart failure Mother   . Hypertension Father   . Liver disease Sister        liver failure  . Arrhythmia Brother   . Hypertension Brother   . Cancer Neg Hx        colon   . Breast cancer Neg Hx    Family Psychiatric  History: See admission H&P Social History:  Social History   Substance and Sexual Activity  Alcohol Use No  . Alcohol/week: 0.0 standard drinks     Social History   Substance and Sexual Activity  Drug Use No    Social  History   Socioeconomic History  . Marital status: Legally Separated    Spouse name: Not on file  . Number of children: 2  . Years of education: Not on file  . Highest education level: Not on file  Occupational History  . Occupation: Control and instrumentation engineer  Social Needs  . Financial resource strain: Not on file  . Food insecurity    Worry: Not on file    Inability: Not on file  . Transportation needs    Medical: Not on file    Non-medical: Not on file  Tobacco Use  . Smoking status: Never Smoker  . Smokeless tobacco: Never Used  Substance and Sexual Activity  . Alcohol use: No    Alcohol/week: 0.0 standard drinks  . Drug use: No  . Sexual activity: Not on file  Lifestyle  . Physical activity    Days per week: Not on file    Minutes  per session: Not on file  . Stress: Not on file  Relationships  . Social Herbalist on phone: Not on file    Gets together: Not on file    Attends religious service: Not on file    Active member of club or organization: Not on file    Attends meetings of clubs or organizations: Not on file    Relationship status: Not on file  Other Topics Concern  . Not on file  Social History Narrative   Regular exercise- yes, spins daily   Diet: fruits and veggies, water    No caffeine   Additional Social History:    Pain Medications: see PTA Prescriptions: see PTA Over the Counter: see PTA                    Sleep: Fair  Appetite:  Fair  Current Medications: Current Facility-Administered Medications  Medication Dose Route Frequency Provider Last Rate Last Dose  . acetaminophen (TYLENOL) tablet 650 mg  650 mg Oral Q6H PRN Cristofano, Dorene Ar, MD   650 mg at 02/10/19 1351  . acyclovir ointment (ZOVIRAX) 5 %   Topical Q3H Clapacs, John T, MD      . albuterol (VENTOLIN HFA) 108 (90 Base) MCG/ACT inhaler 2 puff  2 puff Inhalation Q6H PRN Money, Lowry Ram, FNP   2 puff at 02/09/19 1423  . alum & mag hydroxide-simeth (MAALOX/MYLANTA) 200-200-20 MG/5ML suspension 30 mL  30 mL Oral Q4H PRN Cristofano, Paul A, MD      . antiseptic oral rinse (BIOTENE) solution 15 mL  15 mL Mouth Rinse PRN Clapacs, John T, MD      . aspirin EC tablet 81 mg  81 mg Oral Daily Money, Lowry Ram, FNP   81 mg at 02/11/19 0858  . atorvastatin (LIPITOR) tablet 20 mg  20 mg Oral Daily Money, Lowry Ram, FNP   20 mg at 02/11/19 0858  . beclomethasone (QVAR) 80 MCG/ACT inhaler 1-2 puff  1-2 puff Inhalation BID Suella Broad, FNP   2 puff at 02/11/19 0857  . calcium-vitamin D (OSCAL WITH D) 500-200 MG-UNIT per tablet 1 tablet  1 tablet Oral Daily Money, Lowry Ram, FNP   1 tablet at 02/11/19 0858  . clonazePAM (KLONOPIN) tablet 0.5 mg  0.5 mg Oral BID Clapacs, Madie Reno, MD   0.5 mg at 02/11/19 0858  . feeding  supplement (ENSURE ENLIVE) (ENSURE ENLIVE) liquid 237 mL  237 mL Oral BID BM Clapacs, Madie Reno, MD   237 mL at 02/10/19  1353  . gabapentin (NEURONTIN) capsule 400 mg  400 mg Oral TID Sharma Covert, MD   400 mg at 02/11/19 0858  . haloperidol (HALDOL) tablet 5 mg  5 mg Oral Q8H PRN Money, Lowry Ram, FNP   5 mg at 02/06/19 0741   Or  . haloperidol lactate (HALDOL) injection 5 mg  5 mg Intramuscular Q8H PRN Money, Darnelle Maffucci B, FNP      . ibuprofen (ADVIL) tablet 600 mg  600 mg Oral Q6H PRN Clapacs, Madie Reno, MD   600 mg at 02/11/19 0858  . LORazepam (ATIVAN) tablet 0.5 mg  0.5 mg Oral Q6H PRN Money, Lowry Ram, FNP   0.5 mg at 02/09/19 0117   Or  . LORazepam (ATIVAN) injection 1 mg  1 mg Intramuscular Q6H PRN Money, Lowry Ram, FNP   1 mg at 02/05/19 1349  . magnesium hydroxide (MILK OF MAGNESIA) suspension 30 mL  30 mL Oral Daily PRN Cristofano, Dorene Ar, MD      . Melatonin TABS 5 mg  5 mg Oral QHS Money, Lowry Ram, FNP   5 mg at 02/10/19 2146  . montelukast (SINGULAIR) tablet 10 mg  10 mg Oral QHS Money, Lowry Ram, FNP   10 mg at 02/10/19 2146  . polyvinyl alcohol (LIQUIFILM TEARS) 1.4 % ophthalmic solution 1 drop  1 drop Both Eyes BID Clapacs, Madie Reno, MD   1 drop at 02/11/19 0857  . QUEtiapine (SEROQUEL XR) 24 hr tablet 400 mg  400 mg Oral QHS Clapacs, John T, MD   400 mg at 02/10/19 2146  . sodium chloride (OCEAN) 0.65 % nasal spray 1 spray  1 spray Each Nare PRN Clapacs, Madie Reno, MD   1 spray at 02/10/19 0947  . temazepam (RESTORIL) capsule 15 mg  15 mg Oral QHS Clapacs, Madie Reno, MD   15 mg at 02/10/19 2146  . triamterene-hydrochlorothiazide (MAXZIDE-25) 37.5-25 MG per tablet 1 tablet  1 tablet Oral Daily Money, Lowry Ram, FNP   1 tablet at 02/11/19 [id number]    Lab Results:  Results for orders placed or performed during the hospital encounter of 02/01/19 (from the past 48 hour(s))  Basic metabolic panel     Status: Abnormal   Collection Time: 02/11/19  7:02 AM  Result Value Ref Range   Sodium 137 135 - 145  mmol/L   Potassium 3.5 3.5 - 5.1 mmol/L   Chloride 99 98 - 111 mmol/L   CO2 29 22 - 32 mmol/L   Glucose, Bld 114 (H) 70 - 99 mg/dL   BUN 16 8 - 23 mg/dL   Creatinine, Ser 0.57 0.44 - 1.00 mg/dL   Calcium 9.1 8.9 - 10.3 mg/dL   GFR calc non Af Amer >60 >60 mL/min   GFR calc Af Amer >60 >60 mL/min   Anion gap 9 5 - 15    Comment: Performed at [facility], [address] [place], [place] [address]    Blood Alcohol level:  Lab Results  Component Value Date   [facility] <10 01/31/2019   ETH <10 [id number]    Metabolic Disorder Labs: Lab Results  Component Value Date   HGBA1C 5.3 09/30/2018   MPG 105.41 09/30/2018   No results found for: PROLACTIN Lab Results  Component Value Date   CHOL 158 02/02/2019   TRIG 49 02/02/2019   HDL 62 02/02/2019   CHOLHDL 2.5 02/02/2019   VLDL 10 02/02/2019   LDLCALC 86 02/02/2019   LDLCALC 87 09/30/2018  Physical Findings: AIMS:  , ,  ,  ,    CIWA:    COWS:     Musculoskeletal: Strength & Muscle Tone: within normal limits Gait & Station: shuffle Patient leans: N/A  Psychiatric Specialty Exam: Physical Exam  Nursing note and vitals reviewed. Constitutional: She is oriented to person, place, and time. She appears well-developed and well-nourished.  HENT:  Head: Normocephalic and atraumatic.  Respiratory: Effort normal.  Neurological: She is alert and oriented to person, place, and time.    ROS  Blood pressure (!) 114/95, pulse 79, temperature 97.8 F (36.6 C), temperature source Oral, resp. rate 18, height 5\' 1"  (1.549 m), weight 49.9 kg, SpO2 100 %.Body mass index is 20.78 kg/m.  General Appearance: Casual  Eye Contact:  Fair  Speech:  Normal Rate  Volume:  Normal  Mood:  Anxious  Affect:  Congruent  Thought Process:  Coherent and Descriptions of Associations: Intact  Orientation:  Full (Time, Place, and Person)  Thought Content:  Logical  Suicidal Thoughts:  No  Homicidal Thoughts:  No  Memory:  Immediate;    Fair Recent;   Fair Remote;   Fair  Judgement:  Intact  Insight:  Fair  Psychomotor Activity:  Increased  Concentration:  Concentration: Fair and Attention Span: Fair  Recall:  [employer] of Knowledge:  Fair  Language:  Good  Akathisia:  Negative  Handed:  Right  AIMS (if indicated):     Assets:  Desire for Improvement Resilience  ADL's:  Intact  Cognition:  WNL  Sleep:  Number of Hours: 5.75     Treatment Plan Summary: Daily contact with patient to assess and evaluate symptoms and progress in treatment, Medication management and Plan : Patient is seen and examined.  Patient is a 63 year old female with the above-stated past psychiatric history who is seen in follow-up.  Diagnosis: #1 schizoaffective disorder; bipolar type versus schizophrenia versus bipolar disorder; most recently manic with psychotic features, #2 hypertension, #3 migraine headaches, #4 asthma, #5 osteopenia, #6 angle-closure glaucoma  Patient is seen in follow-up.  She remains somatic but somewhat improved today.  She was able to smile and engage as well as make eye contact.  She talked about the fact that the only thing she heard in her room last night was the air conditioner noise and not the voices that she had before.  The gabapentin I think has decreased some of her anxiety, and if necessary with some of the restless leg syndrome.  Her blood pressure remained stable.  Her electrolyte abnormalities have all resolved.  No changes in her medications at this point.  1.  Continue albuterol inhaler as needed for wheezing. 2.  Continue acyclovir cream to affected areas for herpes. 3.  Continue coated aspirin 81 mg p.o. daily for heart health. 4.  Continue Lipitor 20 mg p.o. daily for hyperlipidemia. 5.  Continue Qvar daily for COPD. 6.  Continue calcium/vitamin D daily for osteoporosis help. 7.  Continue clonazepam 0.5 mg p.o. twice daily for anxiety and restless leg syndrome. 8.    Continue gabapentin to 400 mg  p.o. 3 times daily for anxiety and restless leg syndrome. 9.  Continue melatonin 5 mg p.o. nightly for insomnia. 10.  Continue Singulair 10 mg p.o. nightly for asthma symptoms. 11.  Continue Seroquel XR 400 mg p.o. nightly for mood stability and sleep. 12.  Continue temazepam 15 mg p.o. nightly for insomnia. 13.  Continue Maxide-25 1 tablet p.o. daily for hypertension. 14.  Disposition planning-in progress.  Sharma Covert, MD 02/11/2019, 10:23 AM

## 2019-02-11 NOTE — Plan of Care (Signed)
D- Patient alert and oriented. Patient presents in a pleasant, improved mood on assessment stating that "I sleep sound until something wakes me up". Patient continues to endorse a headache, rating her pain level a "10/10", and she did request medication from this Probation officer. When asked about depression, patient stated "I take medicine for that". Patient also endorsed anxiety, stating "a lot, watching the news, I'm afraid of Covid" is why she's feeling this way. Patient denies SI, HI, AVH, at this time. Patient had no stated goals for today.   A- Scheduled medications administered to patient, per MD orders. Support and encouragement provided.  Routine safety checks conducted every 15 minutes.  Patient informed to notify staff with problems or concerns.  R- No adverse drug reactions noted. Patient contracts for safety at this time. Patient compliant with medications and treatment plan. Patient receptive, calm, and cooperative. Patient interacts well with others on the unit.  Patient remains safe at this time.  Problem: Education: Goal: Knowledge of [place] [facility] Education information/materials will improve Outcome: Progressing Goal: Emotional status will improve Outcome: Progressing Goal: Mental status will improve Outcome: Progressing Goal: Verbalization of understanding the information provided will improve Outcome: Progressing   Problem: Safety: Goal: Periods of time without injury will increase Outcome: Progressing   Problem: Education: Goal: Utilization of techniques to improve thought processes will improve Outcome: Progressing   Problem: Coping: Goal: Coping ability will improve Outcome: Progressing Goal: Will verbalize feelings Outcome: Progressing   Problem: Coping: Goal: Ability to identify and develop effective coping behavior will improve Outcome: Progressing

## 2019-02-12 NOTE — [facility] Group Notes (Signed)
LCSW Group Therapy Note   02/12/2019 10:54 AM   Type of Therapy and Topic:  Group Therapy:  Overcoming Obstacles   Participation Level:  None   Description of Group:    In this group patients will be encouraged to explore what they see as obstacles to their own wellness and recovery. They will be guided to discuss their thoughts, feelings, and behaviors related to these obstacles. The group will process together ways to cope with barriers, with attention given to specific choices patients can make. Each patient will be challenged to identify changes they are motivated to make in order to overcome their obstacles. This group will be process-oriented, with patients participating in exploration of their own experiences as well as giving and receiving support and challenge from other group members.   Therapeutic Goals: 1. Patient will identify personal and current obstacles as they relate to admission. 2. Patient will identify barriers that currently interfere with their wellness or overcoming obstacles.  3. Patient will identify feelings, thought process and behaviors related to these barriers. 4. Patient will identify two changes they are willing to make to overcome these obstacles:      Summary of Patient Progress Pt was present in group, but did not engage in the group discussion.     Therapeutic Modalities:   Cognitive Behavioral Therapy Solution Focused Therapy Motivational Interviewing Relapse Prevention Therapy  Evalina Field, MSW, LCSW Clinical Social Work 02/12/2019 10:54 AM

## 2019-02-12 NOTE — Progress Notes (Signed)
Recreation Therapy Notes    Date: 02/12/2019  Time: 9:30 am  Location: Craft room  Behavioral response: Appropriate   Intervention Topic: Goals   Discussion/Intervention:   Group content on today was focused on goals. Patients described what goals are and how they define goals. Individuals expressed how they go about setting goals and reaching them. The group identified how important goals are and if they make short term goals to reach long term goals. Patients described how many goals they work on at a time and what affects them not reaching their goal. Individuals described how much time they put into planning and obtaining their goals. The group participated in the intervention "My Goal Board" and made personal goal boards to help them achieve their goal. Clinical Observations/Feedback:  Patient came to group and stated her goal is to go back to the gym. She left group early due to unknown reasons and never returned.  Swayze Kozuch LRT/CTRS         Olie Scaffidi 02/12/2019 12:25 PM

## 2019-02-12 NOTE — Plan of Care (Signed)
Patient is provided with education and reinforcement , patient is gradually  improving in her ability to cope and control and respond well to treatment, minimal socialization,  has no behavioral concerns , encourage patient to engage in groups , patient is complying with her medications , no noticeable side effects noted, safety is maintained and frequent 15 minutes checks is done, patient denies SI/HI/AVH , has not ask for any PRNs no distress noted.   Problem: Education: Goal: Knowledge of [place] [facility] Education information/materials will improve Outcome: Progressing Goal: Emotional status will improve Outcome: Progressing Goal: Mental status will improve Outcome: Progressing Goal: Verbalization of understanding the information provided will improve Outcome: Progressing   Problem: Safety: Goal: Periods of time without injury will increase Outcome: Progressing   Problem: Education: Goal: Utilization of techniques to improve thought processes will improve Outcome: Progressing   Problem: Coping: Goal: Coping ability will improve Outcome: Progressing Goal: Will verbalize feelings Outcome: Progressing   Problem: Coping: Goal: Ability to identify and develop effective coping behavior will improve Outcome: Progressing

## 2019-02-12 NOTE — Plan of Care (Signed)
D- Patient alert and oriented. Patient presents in a pleasant mood on assessment stating that she didn't sleep well last night because her mouth gets dry at night and "I popped up because I couldn't breathe and I've been up ever since". Patient also stated that "I'm as tired when I wake up in the morning as I am when I go to sleep. Patient continues to endorse a headache, rating it a "10/10", in which she did request pain medication. Patient denies depression, however, she rates her anxiety a "10/10", stating "I can't handle the noise, slamming doors, and people talking/screaming, yelling". Patient also denies SI, HI, AVH, at this time. Patient had no stated goals for today.  A- Scheduled medications administered to patient, per MD orders. Support and encouragement provided.  Routine safety checks conducted every 15 minutes.  Patient informed to notify staff with problems or concerns.  R- No adverse drug reactions noted. Patient contracts for safety at this time. Patient compliant with medications and treatment plan. Patient receptive, calm, and cooperative. Patient interacts well with others on the unit.  Patient remains safe at this time.  Problem: Education: Goal: Knowledge of [place] [facility] Education information/materials will improve Outcome: Progressing Goal: Emotional status will improve Outcome: Progressing Goal: Mental status will improve Outcome: Progressing Goal: Verbalization of understanding the information provided will improve Outcome: Progressing   Problem: Safety: Goal: Periods of time without injury will increase Outcome: Progressing   Problem: Education: Goal: Utilization of techniques to improve thought processes will improve Outcome: Progressing   Problem: Coping: Goal: Coping ability will improve Outcome: Progressing Goal: Will verbalize feelings Outcome: Progressing   Problem: Coping: Goal: Ability to identify and develop effective coping behavior will  improve Outcome: Progressing

## 2019-02-12 NOTE — Progress Notes (Signed)
Void note.

## 2019-02-12 NOTE — Progress Notes (Signed)
Patient has a list of things that she needs done as well as medications that she takes at home and is not getting them here.

## 2019-02-12 NOTE — Progress Notes (Signed)
[facility] MD Progress Note  02/12/2019 4:30 PM Theresa Martin  MRN:  [id number] Subjective: Follow-up for this patient with schizoaffective disorder.  Patient seen chart reviewed.  Patient reports she is feeling better today.  She says she is thinking more clearly and not feeling quite as anxious.  She is neatly dressed and groomed today and although she is sitting in her room when I found her with the lights out she was not shaking or pacing or acting catatonic and she was able to engage in an appropriate conversation.  No evidence of any self injury or aggressive behavior or thinking.  Denied any hallucinations in the last day or so. Principal Problem: Schizo affective schizophrenia ([place]) Diagnosis: Principal Problem:   Schizo affective schizophrenia ([place])  Total Time spent with patient: 30 minutes  Past Psychiatric History: Patient has a past history of schizoaffective disorder with previous years of stability  Past Medical History:  Past Medical History:  Diagnosis Date  . Allergy   . Anxiety   . Depression   . Hyperlipidemia   . Hypertension   . Osteopenia after menopause   . Persistent headaches   . Pneumonia   . Postherpetic neuralgia   . Renal disorder   . Shingles outbreak 02/2014  . Skin cancer    moles on right groin and right buttock.    Past Surgical History:  Procedure Laterality Date  . CARDIOVASCULAR STRESS TEST  02/2009   treadmill stress test: Low risk  . [place]  . COLONOSCOPY     2014/2015 [place], normal  . EXPLORATORY LAPAROTOMY     laproscopy for infertility  . EYE SURGERY Bilateral    laser-correct opening b/tn cornea and iris  . MOLE REMOVAL  2017   x 2 moles   Family History:  Family History  Problem Relation Age of Onset  . Hypertension Mother   . Coronary artery disease Mother   . Heart failure Mother   . Hypertension Father   . Liver disease Sister        liver failure  . Arrhythmia Brother   . Hypertension  Brother   . Cancer Neg Hx        colon   . Breast cancer Neg Hx    Family Psychiatric  History: See previous Social History:  Social History   Substance and Sexual Activity  Alcohol Use No  . Alcohol/week: 0.0 standard drinks     Social History   Substance and Sexual Activity  Drug Use No    Social History   Socioeconomic History  . Marital status: Legally Separated    Spouse name: Not on file  . Number of children: 2  . Years of education: Not on file  . Highest education level: Not on file  Occupational History  . Occupation: Control and instrumentation engineer  Social Needs  . Financial resource strain: Not on file  . Food insecurity    Worry: Not on file    Inability: Not on file  . Transportation needs    Medical: Not on file    Non-medical: Not on file  Tobacco Use  . Smoking status: Never Smoker  . Smokeless tobacco: Never Used  Substance and Sexual Activity  . Alcohol use: No    Alcohol/week: 0.0 standard drinks  . Drug use: No  . Sexual activity: Not on file  Lifestyle  . Physical activity    Days per week: Not on file    Minutes per session: Not  on file  . Stress: Not on file  Relationships  . Social Herbalist on phone: Not on file    Gets together: Not on file    Attends religious service: Not on file    Active member of club or organization: Not on file    Attends meetings of clubs or organizations: Not on file    Relationship status: Not on file  Other Topics Concern  . Not on file  Social History Narrative   Regular exercise- yes, spins daily   Diet: fruits and veggies, water    No caffeine   Additional Social History:    Pain Medications: see PTA Prescriptions: see PTA Over the Counter: see PTA                    Sleep: Fair  Appetite:  Fair  Current Medications: Current Facility-Administered Medications  Medication Dose Route Frequency Provider Last Rate Last Dose  . acetaminophen (TYLENOL) tablet 650 mg  650 mg Oral Q6H  PRN Cristofano, Dorene Ar, MD   650 mg at 02/11/19 1258  . acyclovir ointment (ZOVIRAX) 5 %   Topical Q3H Clapacs, John T, MD      . albuterol (VENTOLIN HFA) 108 (90 Base) MCG/ACT inhaler 2 puff  2 puff Inhalation Q6H PRN Money, Lowry Ram, FNP   2 puff at 02/12/19 1448  . alum & mag hydroxide-simeth (MAALOX/MYLANTA) 200-200-20 MG/5ML suspension 30 mL  30 mL Oral Q4H PRN Cristofano, Paul A, MD      . antiseptic oral rinse (BIOTENE) solution 15 mL  15 mL Mouth Rinse PRN Clapacs, John T, MD      . aspirin EC tablet 81 mg  81 mg Oral Daily Money, Lowry Ram, FNP   81 mg at 02/12/19 0909  . atorvastatin (LIPITOR) tablet 20 mg  20 mg Oral Daily Money, Lowry Ram, FNP   20 mg at 02/12/19 0910  . beclomethasone (QVAR) 80 MCG/ACT inhaler 1-2 puff  1-2 puff Inhalation BID Suella Broad, FNP   2 puff at 02/12/19 0908  . calcium-vitamin D (OSCAL WITH D) 500-200 MG-UNIT per tablet 1 tablet  1 tablet Oral Daily Money, Lowry Ram, FNP   1 tablet at 02/12/19 0910  . clonazePAM (KLONOPIN) tablet 0.5 mg  0.5 mg Oral BID Clapacs, Madie Reno, MD   0.5 mg at 02/12/19 0910  . feeding supplement (ENSURE ENLIVE) (ENSURE ENLIVE) liquid 237 mL  237 mL Oral BID BM Clapacs, John T, MD   237 mL at 02/12/19 1448  . gabapentin (NEURONTIN) capsule 400 mg  400 mg Oral TID Sharma Covert, MD   400 mg at 02/12/19 1220  . haloperidol (HALDOL) tablet 5 mg  5 mg Oral Q8H PRN Money, Lowry Ram, FNP   5 mg at 02/06/19 0741   Or  . haloperidol lactate (HALDOL) injection 5 mg  5 mg Intramuscular Q8H PRN Money, Lowry Ram, FNP      . LORazepam (ATIVAN) tablet 0.5 mg  0.5 mg Oral Q6H PRN Money, Lowry Ram, FNP   0.5 mg at 02/09/19 0117   Or  . LORazepam (ATIVAN) injection 1 mg  1 mg Intramuscular Q6H PRN Money, Lowry Ram, FNP   1 mg at 02/05/19 1349  . magnesium hydroxide (MILK OF MAGNESIA) suspension 30 mL  30 mL Oral Daily PRN Cristofano, Dorene Ar, MD      . Melatonin TABS 5 mg  5 mg Oral QHS Money, Lowry Ram, FNP  5 mg at 02/11/19 2120  .  montelukast (SINGULAIR) tablet 10 mg  10 mg Oral QHS Money, Travis B, FNP   10 mg at 02/11/19 2120  . naproxen (NAPROSYN) tablet 375 mg  375 mg Oral BID PRN Sharma Covert, MD   375 mg at 02/12/19 0911  . pantoprazole (PROTONIX) EC tablet 40 mg  40 mg Oral Daily Sharma Covert, MD   40 mg at 02/12/19 0909  . polyvinyl alcohol (LIQUIFILM TEARS) 1.4 % ophthalmic solution 1 drop  1 drop Both Eyes BID Clapacs, Madie Reno, MD   1 drop at 02/12/19 0911  . QUEtiapine (SEROQUEL XR) 24 hr tablet 400 mg  400 mg Oral QHS Clapacs, John T, MD   400 mg at 02/11/19 2120  . sodium chloride (OCEAN) 0.65 % nasal spray 1 spray  1 spray Each Nare PRN Clapacs, Madie Reno, MD   1 spray at 02/12/19 0914  . temazepam (RESTORIL) capsule 15 mg  15 mg Oral QHS Clapacs, Madie Reno, MD   15 mg at 02/11/19 2120  . triamterene-hydrochlorothiazide (MAXZIDE-25) 37.5-25 MG per tablet 1 tablet  1 tablet Oral Daily Money, Lowry Ram, FNP   1 tablet at 02/12/19 [id number]    Lab Results:  Results for orders placed or performed during the hospital encounter of 02/01/19 (from the past 48 hour(s))  Basic metabolic panel     Status: Abnormal   Collection Time: 02/11/19  7:02 AM  Result Value Ref Range   Sodium 137 135 - 145 mmol/L   Potassium 3.5 3.5 - 5.1 mmol/L   Chloride 99 98 - 111 mmol/L   CO2 29 22 - 32 mmol/L   Glucose, Bld 114 (H) 70 - 99 mg/dL   BUN 16 8 - 23 mg/dL   Creatinine, Ser 0.57 0.44 - 1.00 mg/dL   Calcium 9.1 8.9 - 10.3 mg/dL   GFR calc non Af Amer >60 >60 mL/min   GFR calc Af Amer >60 >60 mL/min   Anion gap 9 5 - 15    Comment: Performed at [facility], [address] [place],  [address]    Blood Alcohol level:  Lab Results  Component Value Date   [facility] <10 01/31/2019   ETH <10 [id number]    Metabolic Disorder Labs: Lab Results  Component Value Date   HGBA1C 5.3 09/30/2018   MPG 105.41 09/30/2018   No results found for: PROLACTIN Lab Results  Component Value Date   CHOL 158 02/02/2019    TRIG 49 02/02/2019   HDL 62 02/02/2019   CHOLHDL 2.5 02/02/2019   VLDL 10 02/02/2019   LDLCALC 86 02/02/2019   LDLCALC 87 09/30/2018    Physical Findings: AIMS:  , ,  ,  ,    CIWA:    COWS:     Musculoskeletal: Strength & Muscle Tone: within normal limits Gait & Station: normal Patient leans: N/A  Psychiatric Specialty Exam: Physical Exam  Nursing note and vitals reviewed. Constitutional: She appears well-developed and well-nourished.  HENT:  Head: Normocephalic and atraumatic.  Eyes: Pupils are equal, round, and reactive to light. Conjunctivae are normal.  Neck: Normal range of motion.  Cardiovascular: Normal heart sounds.  Respiratory: Effort normal.  GI: Soft.  Musculoskeletal: Normal range of motion.  Neurological: She is alert.  Skin: Skin is warm and dry.  Psychiatric: She has a normal mood and affect. Her speech is normal and behavior is normal. Thought content normal. Cognition and memory are normal.  Review of Systems  Constitutional: Negative.   HENT: Negative.   Eyes: Negative.   Respiratory: Negative.   Cardiovascular: Negative.   Gastrointestinal: Negative.   Musculoskeletal: Negative.   Skin: Negative.   Neurological: Negative.   Psychiatric/Behavioral: Negative for depression, hallucinations, memory loss, substance abuse and suicidal ideas. The patient is nervous/anxious and has insomnia.     Blood pressure (!) 142/70, pulse (!) 104, temperature 98.1 F (36.7 C), temperature source Oral, resp. rate 18, height 5\' 1"  (1.549 m), weight 49.9 kg, SpO2 100 %.Body mass index is 20.78 kg/m.  General Appearance: Casual  Eye Contact:  Good  Speech:  Clear and Coherent  Volume:  Normal  Mood:  Euthymic  Affect:  Congruent  Thought Process:  Coherent  Orientation:  Full (Time, Place, and Person)  Thought Content:  Logical  Suicidal Thoughts:  No  Homicidal Thoughts:  No  Memory:  Immediate;   Fair Recent;   Fair Remote;   Fair  Judgement:  Fair   Insight:  Fair  Psychomotor Activity:  Normal  Concentration:  Concentration: Fair  Recall:  [employer] of Knowledge:  Fair  Language:  Fair  Akathisia:  No  Handed:  Right  AIMS (if indicated):     Assets:  Desire for Improvement Housing Physical Health Social Support  ADL's:  Intact  Cognition:  WNL  Sleep:  Number of Hours: 5.5     Treatment Plan Summary: Daily contact with patient to assess and evaluate symptoms and progress in treatment, Medication management and Plan No change to medication for today.  Supportive counseling and review of overall treatment plan and of medicine with patient.  She is looking much better and I think that she may be ready for discharge by Wednesday morning if stability continues.  This is the first time I have seen her even sort of doing okay and still as I said she was sitting in the dark so we want to make sure she is functional and has talked to her brother before she goes.  Alethia Berthold, MD 02/12/2019, 4:30 PM

## 2019-02-13 MED ORDER — XYLITOL 550 MG MT DISK
1.0000 | DISK | Freq: Every day | OROMUCOSAL | Status: DC
  Administered 2019-02-13: 22:00:00 1 via OROMUCOSAL
  Filled 2019-02-13 (×2): qty 1

## 2019-02-13 MED ORDER — NON FORMULARY: 1.0000 | Freq: Every day | Status: DC

## 2019-02-13 NOTE — [facility] Group Notes (Signed)

## 2019-02-13 NOTE — Progress Notes (Signed)
Recreation Therapy Notes     Date: 02/13/2019  Time: 9:30 am   Location: Craft room   Behavioral response: N/A   Intervention Topic: Stress Management   Discussion/Intervention: Patient did not attend group.   Clinical Observations/Feedback:  Patient did not attend group.   Lajune Perine LRT/CTRS        Kenza Munar 02/13/2019 10:49 AM

## 2019-02-13 NOTE — Plan of Care (Signed)
D- Patient alert and oriented. Patient presents in an anxious, but pleasant mood on assessment stating that she did not sleep last night and feels like she has been dragging. Patient continues to endorse a constant headache, rating her pain a "10/10", and she did request pain medication from this Probation officer. Patient denies depression, however, she reports being anxious because "I can't sleep". Patient did state to this Probation officer that "yesterday was the best day, I felt for me". Patient also denies SI, HI, AVH, at this time. Patient had no stated goals for today.  A- Scheduled medications administered to patient, per MD orders. Support and encouragement provided.  Routine safety checks conducted every 15 minutes.  Patient informed to notify staff with problems or concerns.  R- No adverse drug reactions noted. Patient contracts for safety at this time. Patient compliant with medications and treatment plan. Patient receptive, calm, and cooperative. Patient interacts well with others on the unit.  Patient remains safe at this time.  Problem: Education: Goal: Knowledge of  General Education information/materials will improve Outcome: Progressing Goal: Emotional status will improve Outcome: Progressing Goal: Mental status will improve Outcome: Progressing Goal: Verbalization of understanding the information provided will improve Outcome: Progressing   Problem: Safety: Goal: Periods of time without injury will increase Outcome: Progressing   Problem: Education: Goal: Utilization of techniques to improve thought processes will improve Outcome: Progressing   Problem: Coping: Goal: Coping ability will improve Outcome: Progressing Goal: Will verbalize feelings Outcome: Progressing   Problem: Coping: Goal: Ability to identify and develop effective coping behavior will improve Outcome: Progressing

## 2019-02-13 NOTE — Progress Notes (Signed)
[facility] MD Progress Note  02/13/2019 12:07 PM Theresa Martin  MRN:  [id number] Subjective: I feel ok. I just have a migraine. I didn't rest well at all. I do see a neurologist who prescribes me gabapentin and propanolol. I feel good though.   Objective: 63 year old female who presented to the ED with her brother, after presenting in a manic state. Today she continues to present with much improved mood, affect and thought processes. She is observed in the room reading her sympathy cards. Her room appears to be neat and tidy, and she presents well groomed. She is alert and oriented. She complains about not resting well last night, and has a migraine as a result. She is observed ambulating in the milieu and looking forward to completing her goals so that she can successfully discharge.  She has a history of migraines that is relieved by taking Aleve, however she continues to endorse pain. She does not appear to be responding to internal stimuli, preoccupations. At this time she denies si/hi/avh.    Principal Problem: Schizo affective schizophrenia ([place]) Diagnosis: Principal Problem:   Schizo affective schizophrenia ([place])  Total Time spent with patient: 30 minutes  Past Psychiatric History: Patient has a past history of schizoaffective disorder with previous years of stability  Past Medical History:  Past Medical History:  Diagnosis Date  . Allergy   . Anxiety   . Depression   . Hyperlipidemia   . Hypertension   . Osteopenia after menopause   . Persistent headaches   . Pneumonia   . Postherpetic neuralgia   . Renal disorder   . Shingles outbreak 02/2014  . Skin cancer    moles on right groin and right buttock.    Past Surgical History:  Procedure Laterality Date  . CARDIOVASCULAR STRESS TEST  02/2009   treadmill stress test: Low risk  . [place]  . COLONOSCOPY     2014/2015 [place], normal  . EXPLORATORY LAPAROTOMY     laproscopy for infertility  . EYE  SURGERY Bilateral    laser-correct opening b/tn cornea and iris  . MOLE REMOVAL  2017   x 2 moles   Family History:  Family History  Problem Relation Age of Onset  . Hypertension Mother   . Coronary artery disease Mother   . Heart failure Mother   . Hypertension Father   . Liver disease Sister        liver failure  . Arrhythmia Brother   . Hypertension Brother   . Cancer Neg Hx        colon   . Breast cancer Neg Hx    Family Psychiatric  History: See previous Social History:  Social History   Substance and Sexual Activity  Alcohol Use No  . Alcohol/week: 0.0 standard drinks     Social History   Substance and Sexual Activity  Drug Use No    Social History   Socioeconomic History  . Marital status: Legally Separated    Spouse name: Not on file  . Number of children: 2  . Years of education: Not on file  . Highest education level: Not on file  Occupational History  . Occupation: Control and instrumentation engineer  Social Needs  . Financial resource strain: Not on file  . Food insecurity    Worry: Not on file    Inability: Not on file  . Transportation needs    Medical: Not on file    Non-medical: Not on file  Tobacco Use  . Smoking status: Never Smoker  . Smokeless tobacco: Never Used  Substance and Sexual Activity  . Alcohol use: No    Alcohol/week: 0.0 standard drinks  . Drug use: No  . Sexual activity: Not on file  Lifestyle  . Physical activity    Days per week: Not on file    Minutes per session: Not on file  . Stress: Not on file  Relationships  . Social Herbalist on phone: Not on file    Gets together: Not on file    Attends religious service: Not on file    Active member of club or organization: Not on file    Attends meetings of clubs or organizations: Not on file    Relationship status: Not on file  Other Topics Concern  . Not on file  Social History Narrative   Regular exercise- yes, spins daily   Diet: fruits and veggies, water    No  caffeine   Additional Social History:    Pain Medications: see PTA Prescriptions: see PTA Over the Counter: see PTA     Sleep: Fair  Appetite:  Fair  Current Medications: Current Facility-Administered Medications  Medication Dose Route Frequency Provider Last Rate Last Dose  . acetaminophen (TYLENOL) tablet 650 mg  650 mg Oral Q6H PRN Cristofano, Dorene Ar, MD   650 mg at 02/13/19 1114  . acyclovir ointment (ZOVIRAX) 5 %   Topical Q3H Clapacs, John T, MD      . albuterol (VENTOLIN HFA) 108 (90 Base) MCG/ACT inhaler 2 puff  2 puff Inhalation Q6H PRN Money, Lowry Ram, FNP   2 puff at 02/12/19 1448  . alum & mag hydroxide-simeth (MAALOX/MYLANTA) 200-200-20 MG/5ML suspension 30 mL  30 mL Oral Q4H PRN Cristofano, Paul A, MD      . antiseptic oral rinse (BIOTENE) solution 15 mL  15 mL Mouth Rinse PRN Clapacs, John T, MD      . aspirin EC tablet 81 mg  81 mg Oral Daily Money, Lowry Ram, FNP   81 mg at 02/13/19 0857  . atorvastatin (LIPITOR) tablet 20 mg  20 mg Oral Daily Money, Lowry Ram, FNP   20 mg at 02/13/19 0857  . beclomethasone (QVAR) 80 MCG/ACT inhaler 1-2 puff  1-2 puff Inhalation BID Suella Broad, FNP   2 puff at 02/13/19 0856  . calcium-vitamin D (OSCAL WITH D) 500-200 MG-UNIT per tablet 1 tablet  1 tablet Oral Daily Money, Lowry Ram, FNP   1 tablet at 02/13/19 0857  . clonazePAM (KLONOPIN) tablet 0.5 mg  0.5 mg Oral BID Clapacs, Madie Reno, MD   0.5 mg at 02/13/19 0857  . feeding supplement (ENSURE ENLIVE) (ENSURE ENLIVE) liquid 237 mL  237 mL Oral BID BM Clapacs, John T, MD   237 mL at 02/13/19 1100  . gabapentin (NEURONTIN) capsule 400 mg  400 mg Oral TID Sharma Covert, MD   400 mg at 02/13/19 1114  . haloperidol (HALDOL) tablet 5 mg  5 mg Oral Q8H PRN Money, Lowry Ram, FNP   5 mg at 02/06/19 0741   Or  . haloperidol lactate (HALDOL) injection 5 mg  5 mg Intramuscular Q8H PRN Money, Lowry Ram, FNP      . LORazepam (ATIVAN) tablet 0.5 mg  0.5 mg Oral Q6H PRN Money, Lowry Ram,  FNP   0.5 mg at 02/09/19 0117   Or  . LORazepam (ATIVAN) injection 1 mg  1 mg Intramuscular Q6H  PRN Money, Lowry Ram, FNP   1 mg at 02/05/19 1349  . magnesium hydroxide (MILK OF MAGNESIA) suspension 30 mL  30 mL Oral Daily PRN Cristofano, Dorene Ar, MD      . Melatonin TABS 5 mg  5 mg Oral QHS Money, Lowry Ram, FNP   5 mg at 02/12/19 2128  . montelukast (SINGULAIR) tablet 10 mg  10 mg Oral QHS Money, Travis B, FNP   10 mg at 02/12/19 2128  . naproxen (NAPROSYN) tablet 375 mg  375 mg Oral BID PRN Sharma Covert, MD   375 mg at 02/13/19 0856  . pantoprazole (PROTONIX) EC tablet 40 mg  40 mg Oral Daily Sharma Covert, MD   40 mg at 02/13/19 0857  . polyvinyl alcohol (LIQUIFILM TEARS) 1.4 % ophthalmic solution 1 drop  1 drop Both Eyes BID Clapacs, Madie Reno, MD   1 drop at 02/13/19 0856  . QUEtiapine (SEROQUEL XR) 24 hr tablet 400 mg  400 mg Oral QHS Clapacs, John T, MD   400 mg at 02/12/19 2127  . sodium chloride (OCEAN) 0.65 % nasal spray 1 spray  1 spray Each Nare PRN Clapacs, Madie Reno, MD   1 spray at 02/12/19 1719  . temazepam (RESTORIL) capsule 15 mg  15 mg Oral QHS Clapacs, Madie Reno, MD   15 mg at 02/12/19 2128  . triamterene-hydrochlorothiazide (MAXZIDE-25) 37.5-25 MG per tablet 1 tablet  1 tablet Oral Daily Money, Lowry Ram, FNP   1 tablet at 02/13/19 [id number]    Lab Results:  No results found for this or any previous visit (from the past 48 hour(s)).  Blood Alcohol level:  Lab Results  Component Value Date   ETH <10 01/31/2019   ETH <10 123456    Metabolic Disorder Labs: Lab Results  Component Value Date   HGBA1C 5.3 09/30/2018   MPG 105.41 09/30/2018   No results found for: PROLACTIN Lab Results  Component Value Date   CHOL 158 02/02/2019   TRIG 49 02/02/2019   HDL 62 02/02/2019   CHOLHDL 2.5 02/02/2019   VLDL 10 02/02/2019   LDLCALC 86 02/02/2019   LDLCALC 87 09/30/2018    Physical Findings: AIMS:  , ,  ,  ,    CIWA:    COWS:     Musculoskeletal: Strength & Muscle  Tone: within normal limits Gait & Station: normal Patient leans: N/A  Psychiatric Specialty Exam: Physical Exam  Nursing note and vitals reviewed. Constitutional: She appears well-developed and well-nourished.  HENT:  Head: Normocephalic and atraumatic.  Eyes: Pupils are equal, round, and reactive to light. Conjunctivae are normal.  Neck: Normal range of motion.  Cardiovascular: Normal heart sounds.  Respiratory: Effort normal.  GI: Soft.  Musculoskeletal: Normal range of motion.  Neurological: She is alert.  Skin: Skin is warm and dry.  Psychiatric: She has a normal mood and affect. Her speech is normal and behavior is normal. Thought content normal. Cognition and memory are normal.    Review of Systems  Constitutional: Negative.   HENT: Negative.   Eyes: Negative.   Respiratory: Negative.   Cardiovascular: Negative.   Gastrointestinal: Negative.   Musculoskeletal: Negative.   Skin: Negative.   Neurological: Negative.   Psychiatric/Behavioral: Negative for depression, hallucinations, memory loss, substance abuse and suicidal ideas. The patient is nervous/anxious and has insomnia.     Blood pressure 140/72, pulse 72, temperature 98.1 F (36.7 C), temperature source Oral, resp. rate 18, height 5\' 1"  (1.549 m), weight  49.9 kg, SpO2 100 %.Body mass index is 20.78 kg/m.  General Appearance: Casual  Eye Contact:  Good  Speech:  Clear and Coherent  Volume:  Normal  Mood:  Euthymic  Affect:  Congruent  Thought Process:  Coherent  Orientation:  Full (Time, Place, and Person)  Thought Content:  Logical  Suicidal Thoughts:  No  Homicidal Thoughts:  No  Memory:  Immediate;   Fair Recent;   Fair Remote;   Fair  Judgement:  Fair  Insight:  Fair  Psychomotor Activity:  Normal  Concentration:  Concentration: Fair  Recall:  [employer] of Knowledge:  Fair  Language:  Fair  Akathisia:  No  Handed:  Right  AIMS (if indicated):     Assets:  Desire for  Improvement Housing Physical Health Social Support  ADL's:  Intact  Cognition:  WNL  Sleep:  Number of Hours: 5.75     Treatment Plan Summary: Daily contact with patient to assess and evaluate symptoms and progress in treatment, Medication management and Plan COntinue with current medications at this time. Patient presents much improved as a response to counseling and medication management. She appears to be ready for discharge as her thought process are normal, and she has a sound discharge plan. WIll be imperative for her to continue taking her medication once she discharges.   Suella Broad, FNP 02/13/2019, 12:07 PM

## 2019-02-14 NOTE — Progress Notes (Signed)
D- Patient alert and oriented. Patient presents in an anxious, but pleasant mood on assessment  she did sleep last night.  Patient did not complain of a headache. Patient denies depression, however, she reports being anxious because "she had not taken her inhaler" I reassured her that she received her inhaler twice. Patient also denies SI, HI, AVH, at this time.   A- Scheduled medications administered to patient, per MD orders. Support and encouragement provided.  Routine safety checks conducted every 15 minutes.  Patient informed to notify staff with problems or concerns.  R- No adverse drug reactions noted. Patient contracts for safety at this time. Patient compliant with medications and treatment plan. Patient receptive, calm, and cooperative. Patient interacts well with others on the unit.  Patient remains safe at this time.  Problem: Education: Goal: Knowledge of [place] Education information/materials will improve Outcome: Progressing Goal: Emotional status will improve Outcome: Progressing Goal: Mental status will improve Outcome: Progressing Goal: Verbalization of understanding the information provided will improve Outcome: Progressing   Problem: Safety: Goal: Periods of time without injury will increase Outcome: Progressing   Problem: Education: Goal: Utilization of techniques to improve thought processes will improve Outcome: Progressing   Problem: Coping: Goal: Coping ability will improve Outcome: Progressing Goal: Will verbalize feelings Outcome: Progressing   Problem: Coping: Goal: Ability to identify and develop effective coping behavior will improve Outcome: Progressing

## 2019-02-14 NOTE — Plan of Care (Signed)
Patient sleep through the night and only requires routine visual checks , patient complied with her medications denies any SI/HI/AVH , no distress.   Problem: Education: Goal: Knowledge of  General Education information/materials will improve Outcome: Progressing Goal: Emotional status will improve Outcome: Progressing Goal: Mental status will improve Outcome: Progressing Goal: Verbalization of understanding the information provided will improve Outcome: Progressing   Problem: Safety: Goal: Periods of time without injury will increase Outcome: Progressing   Problem: Education: Goal: Utilization of techniques to improve thought processes will improve Outcome: Progressing   Problem: Coping: Goal: Coping ability will improve Outcome: Progressing Goal: Will verbalize feelings Outcome: Progressing   Problem: Coping: Goal: Ability to identify and develop effective coping behavior will improve Outcome: Progressing

## 2019-02-14 NOTE — Plan of Care (Signed)
Patient stated that her anxiety is better but she gets anxious at times and the "rescue inhaler" [Albuterol] will help her.Patient states "the medicine I had for sleep last night helped me."Patient kept her room and herself neat and tidy.Denies SI,HI and AVH.Appetite and energy level good.Support and encouragement given.

## 2019-02-14 NOTE — BHH Suicide Risk Assessment (Signed)
[facility] Discharge Suicide Risk Assessment   Principal Problem: Schizo affective schizophrenia [facility]) Discharge Diagnoses: Principal Problem:   Schizo affective schizophrenia ([place])   Total Time spent with patient: 30 minutes  Musculoskeletal: Strength & Muscle Tone: within normal limits Gait & Station: normal Patient leans: N/A  Psychiatric Specialty Exam: Review of Systems  Constitutional: Negative.   HENT: Negative.   Eyes: Negative.   Respiratory: Negative.   Cardiovascular: Negative.   Gastrointestinal: Negative.   Musculoskeletal: Negative.   Skin: Negative.   Neurological: Negative.   Psychiatric/Behavioral: Negative.     Blood pressure 123/69, pulse 64, temperature 97.6 F (36.4 C), temperature source Oral, resp. rate 17, height 5\' 1"  (1.549 m), weight 49.9 kg, SpO2 100 %.Body mass index is 20.78 kg/m.  General Appearance: Casual  Eye Contact::  Good  Speech:  Clear and Coherent409  Volume:  Normal  Mood:  Euthymic  Affect:  Congruent  Thought Process:  Coherent  Orientation:  Full (Time, Place, and Person)  Thought Content:  Logical  Suicidal Thoughts:  No  Homicidal Thoughts:  No  Memory:  Immediate;   Fair Recent;   Fair Remote;   Fair  Judgement:  Fair  Insight:  Fair  Psychomotor Activity:  Normal  Concentration:  Fair  Recall:  [employer] of [place]  Language: Fair  Akathisia:  No  Handed:  Right  AIMS (if indicated):     Assets:  Desire for Improvement Housing Resilience Social Support  Sleep:  Number of Hours: 6  Cognition: WNL  ADL's:  Intact   Mental Status Per Nursing Assessment::   On Admission:  NA  Demographic Factors:  Caucasian and Living alone  Loss Factors: NA  Historical Factors: NA  Risk Reduction Factors:   Religious beliefs about death, Positive social support and Positive therapeutic relationship  Continued Clinical Symptoms:  Bipolar Disorder:   Mixed State  Cognitive Features That Contribute To Risk:   None    Suicide Risk:  Minimal: No identifiable suicidal ideation.  Patients presenting with no risk factors but with morbid ruminations; may be classified as minimal risk based on the severity of the depressive symptoms  Follow-up Information    [facility] Follow up on 03/02/2019.   Why: You have an appointment with Rushie Chestnut scheduled for Friday 03/02/2019 at 3:20PM. It will be in person, if you prefer telepsych you will need to call them. If you need to cancel or reschedule you must give them a 24 hour notice. Thank You! Contact information: [place] [address] [phone]           Plan Of Care/Follow-up recommendations:  Activity:  Activity as tolerated Diet:  Regular diet Other:  Follow-up with outpatient treatment in the community as scheduled with [facility] in [place].  Alethia Berthold, MD 02/14/2019, 4:15 PM

## 2019-02-14 NOTE — Progress Notes (Signed)
[facility] MD Progress Note  02/14/2019 4:12 PM Theresa Martin  MRN:  [id number] Subjective: Patient seen chart reviewed.  At last patient seems to be doing much better.  She is neatly groomed and dressed.  She is taking care of her ADLs fine.  She was able to sit down and have a very lucid conversation.  No delusional content.  No bizarre behavior.  No bizarre somatic complaints.  Tolerating medicine well.  Sleeping okay at night no new medical issues.  Patient has good insight and is interested in planning discharge Principal Problem: Schizo affective schizophrenia ([place]) Diagnosis: Principal Problem:   Schizo affective schizophrenia ([place])  Total Time spent with patient: 30 minutes  Past Psychiatric History: Past history of schizoaffective disorder previously stable for a long time until changes in medication  Past Medical History:  Past Medical History:  Diagnosis Date  . Allergy   . Anxiety   . Depression   . Hyperlipidemia   . Hypertension   . Osteopenia after menopause   . Persistent headaches   . Pneumonia   . Postherpetic neuralgia   . Renal disorder   . Shingles outbreak 02/2014  . Skin cancer    moles on right groin and right buttock.    Past Surgical History:  Procedure Laterality Date  . CARDIOVASCULAR STRESS TEST  02/2009   treadmill stress test: Low risk  . [place]  . COLONOSCOPY     2014/2015 [place], normal  . EXPLORATORY LAPAROTOMY     laproscopy for infertility  . EYE SURGERY Bilateral    laser-correct opening b/tn cornea and iris  . MOLE REMOVAL  2017   x 2 moles   Family History:  Family History  Problem Relation Age of Onset  . Hypertension Mother   . Coronary artery disease Mother   . Heart failure Mother   . Hypertension Father   . Liver disease Sister        liver failure  . Arrhythmia Brother   . Hypertension Brother   . Cancer Neg Hx        colon   . Breast cancer Neg Hx    Family Psychiatric  History: See  previous Social History:  Social History   Substance and Sexual Activity  Alcohol Use No  . Alcohol/week: 0.0 standard drinks     Social History   Substance and Sexual Activity  Drug Use No    Social History   Socioeconomic History  . Marital status: Legally Separated    Spouse name: Not on file  . Number of children: 2  . Years of education: Not on file  . Highest education level: Not on file  Occupational History  . Occupation: Control and instrumentation engineer  Social Needs  . Financial resource strain: Not on file  . Food insecurity    Worry: Not on file    Inability: Not on file  . Transportation needs    Medical: Not on file    Non-medical: Not on file  Tobacco Use  . Smoking status: Never Smoker  . Smokeless tobacco: Never Used  Substance and Sexual Activity  . Alcohol use: No    Alcohol/week: 0.0 standard drinks  . Drug use: No  . Sexual activity: Not on file  Lifestyle  . Physical activity    Days per week: Not on file    Minutes per session: Not on file  . Stress: Not on file  Relationships  . Social connections  Talks on phone: Not on file    Gets together: Not on file    Attends religious service: Not on file    Active member of club or organization: Not on file    Attends meetings of clubs or organizations: Not on file    Relationship status: Not on file  Other Topics Concern  . Not on file  Social History Narrative   Regular exercise- yes, spins daily   Diet: fruits and veggies, water    No caffeine   Additional Social History:    Pain Medications: see PTA Prescriptions: see PTA Over the Counter: see PTA                    Sleep: Fair  Appetite:  Fair  Current Medications: Current Facility-Administered Medications  Medication Dose Route Frequency Provider Last Rate Last Dose  . acetaminophen (TYLENOL) tablet 650 mg  650 mg Oral Q6H PRN Cristofano, Dorene Ar, MD   650 mg at 02/14/19 1246  . acyclovir ointment (ZOVIRAX) 5 %   Topical Q3H  Tavarious Freel T, MD      . albuterol (VENTOLIN HFA) 108 (90 Base) MCG/ACT inhaler 2 puff  2 puff Inhalation Q6H PRN Money, Lowry Ram, FNP   2 puff at 02/14/19 1245  . alum & mag hydroxide-simeth (MAALOX/MYLANTA) 200-200-20 MG/5ML suspension 30 mL  30 mL Oral Q4H PRN Cristofano, Paul A, MD      . antiseptic oral rinse (BIOTENE) solution 15 mL  15 mL Mouth Rinse PRN Cadence Haslam T, MD      . aspirin EC tablet 81 mg  81 mg Oral Daily Money, Lowry Ram, FNP   81 mg at 02/14/19 0818  . atorvastatin (LIPITOR) tablet 20 mg  20 mg Oral Daily Money, Lowry Ram, FNP   20 mg at 02/14/19 0818  . beclomethasone (QVAR) 80 MCG/ACT inhaler 1-2 puff  1-2 puff Inhalation BID Suella Broad, FNP   1 puff at 02/14/19 0847  . calcium-vitamin D (OSCAL WITH D) 500-200 MG-UNIT per tablet 1 tablet  1 tablet Oral Daily Money, Lowry Ram, FNP   1 tablet at 02/14/19 0820  . clonazePAM (KLONOPIN) tablet 0.5 mg  0.5 mg Oral BID Chane Magner, Madie Reno, MD   0.5 mg at 02/14/19 0818  . feeding supplement (ENSURE ENLIVE) (ENSURE ENLIVE) liquid 237 mL  237 mL Oral BID BM Eulalah Rupert T, MD   237 mL at 02/14/19 1040  . gabapentin (NEURONTIN) capsule 400 mg  400 mg Oral TID Sharma Covert, MD   400 mg at 02/14/19 1139  . haloperidol (HALDOL) tablet 5 mg  5 mg Oral Q8H PRN Money, Lowry Ram, FNP   5 mg at 02/06/19 0741   Or  . haloperidol lactate (HALDOL) injection 5 mg  5 mg Intramuscular Q8H PRN Money, Lowry Ram, FNP      . LORazepam (ATIVAN) tablet 0.5 mg  0.5 mg Oral Q6H PRN Money, Lowry Ram, FNP   0.5 mg at 02/13/19 1252   Or  . LORazepam (ATIVAN) injection 1 mg  1 mg Intramuscular Q6H PRN Money, Lowry Ram, FNP   1 mg at 02/05/19 1349  . magnesium hydroxide (MILK OF MAGNESIA) suspension 30 mL  30 mL Oral Daily PRN Cristofano, Dorene Ar, MD      . Melatonin TABS 5 mg  5 mg Oral QHS Money, Lowry Ram, FNP   5 mg at 02/13/19 2123  . montelukast (SINGULAIR) tablet 10 mg  10 mg  Oral QHS Money, Lowry Ram, FNP   10 mg at 02/13/19 2123  . naproxen  (NAPROSYN) tablet 375 mg  375 mg Oral BID PRN Sharma Covert, MD   375 mg at 02/13/19 0856  . pantoprazole (PROTONIX) EC tablet 40 mg  40 mg Oral Daily Sharma Covert, MD   40 mg at 02/14/19 0818  . polyvinyl alcohol (LIQUIFILM TEARS) 1.4 % ophthalmic solution 1 drop  1 drop Both Eyes BID Sahir Tolson, Madie Reno, MD   1 drop at 02/14/19 0848  . QUEtiapine (SEROQUEL XR) 24 hr tablet 400 mg  400 mg Oral QHS Aricela Bertagnolli T, MD   400 mg at 02/13/19 2123  . sodium chloride (OCEAN) 0.65 % nasal spray 1 spray  1 spray Each Nare PRN Mycah Mcdougall, Madie Reno, MD   1 spray at 02/13/19 2158  . temazepam (RESTORIL) capsule 15 mg  15 mg Oral QHS Tacie Mccuistion, Madie Reno, MD   15 mg at 02/13/19 2123  . triamterene-hydrochlorothiazide (MAXZIDE-25) 37.5-25 MG per tablet 1 tablet  1 tablet Oral Daily Money, Lowry Ram, FNP   1 tablet at 02/14/19 0819  . Xylitol DISK 1 tablet  1 tablet Mouth/Throat QHS Diahann Guajardo, Madie Reno, MD   1 tablet at 02/13/19 2155    Lab Results: No results found for this or any previous visit (from the past 48 hour(s)).  Blood Alcohol level:  Lab Results  Component Value Date   ETH <10 01/31/2019   ETH <10 123456    Metabolic Disorder Labs: Lab Results  Component Value Date   HGBA1C 5.3 09/30/2018   MPG 105.41 09/30/2018   No results found for: PROLACTIN Lab Results  Component Value Date   CHOL 158 02/02/2019   TRIG 49 02/02/2019   HDL 62 02/02/2019   CHOLHDL 2.5 02/02/2019   VLDL 10 02/02/2019   LDLCALC 86 02/02/2019   LDLCALC 87 09/30/2018    Physical Findings: AIMS:  , ,  ,  ,    CIWA:    COWS:     Musculoskeletal: Strength & Muscle Tone: within normal limits Gait & Station: normal Patient leans: N/A  Psychiatric Specialty Exam: Physical Exam  Nursing note and vitals reviewed. Constitutional: She appears well-developed and well-nourished.  HENT:  Head: Normocephalic and atraumatic.  Eyes: Pupils are equal, round, and reactive to light. Conjunctivae are normal.  Neck:  Normal range of motion.  Cardiovascular: Regular rhythm and normal heart sounds.  Respiratory: Effort normal. No respiratory distress.  GI: Soft.  Musculoskeletal: Normal range of motion.  Neurological: She is alert.  Skin: Skin is warm and dry.  Psychiatric: She has a normal mood and affect. Her speech is normal and behavior is normal. Judgment and thought content normal. Her affect is not blunt. Cognition and memory are normal.    Review of Systems  Constitutional: Negative.   HENT: Negative.   Eyes: Negative.   Respiratory: Negative.   Cardiovascular: Negative.   Gastrointestinal: Negative.   Musculoskeletal: Negative.   Skin: Negative.   Neurological: Negative.   Psychiatric/Behavioral: Negative.     Blood pressure 123/69, pulse 64, temperature 97.6 F (36.4 C), temperature source Oral, resp. rate 17, height 5\' 1"  (1.549 m), weight 49.9 kg, SpO2 100 %.Body mass index is 20.78 kg/m.  General Appearance: Casual  Eye Contact:  Good  Speech:  Clear and Coherent  Volume:  Normal  Mood:  Euthymic  Affect:  Congruent  Thought Process:  Coherent  Orientation:  Full (Time, Place, and Person)  Thought Content:  Logical  Suicidal Thoughts:  No  Homicidal Thoughts:  No  Memory:  Immediate;   Fair Recent;   Fair Remote;   Fair  Judgement:  Fair  Insight:  Fair  Psychomotor Activity:  Normal  Concentration:  Concentration: Fair  Recall:  [employer] of Knowledge:  Fair  Language:  Fair  Akathisia:  No  Handed:  Right  AIMS (if indicated):     Assets:  Desire for Improvement Housing Physical Health Social Support  ADL's:  Intact  Cognition:  WNL  Sleep:  Number of Hours: 6     Treatment Plan Summary: Daily contact with patient to assess and evaluate symptoms and progress in treatment, Medication management and Plan I spoke with her today about her challenges when she is discharged.  Patient shows good insight and is agreeable to outpatient treatment and staying on  medicine.  We will plan for discharge tomorrow.  Alethia Berthold, MD 02/14/2019, 4:12 PM

## 2019-02-14 NOTE — Progress Notes (Signed)
Recreation Therapy Notes   Date: 02/14/2019  Time: 9:30 am   Location: Craft room   Behavioral response: N/A   Intervention Topic: Necessities    Discussion/Intervention: Patient did not attend group.   Clinical Observations/Feedback:  Patient did not attend group.   Jaret Coppedge LRT/CTRS        Shyia Fillingim 02/14/2019 10:51 AM

## 2019-02-14 NOTE — BHH Group Notes (Signed)
Emotional Regulation 02/14/2019 1PM  Type of Therapy/Topic:  Group Therapy:  Emotion Regulation  Participation Level:  Did Not Attend   Description of Group:   The purpose of this group is to assist patients in learning to regulate negative emotions and experience positive emotions. Patients will be guided to discuss ways in which they have been vulnerable to their negative emotions. These vulnerabilities will be juxtaposed with experiences of positive emotions or situations, and patients will be challenged to use positive emotions to combat negative ones. Special emphasis will be placed on coping with negative emotions in conflict situations, and patients will process healthy conflict resolution skills.  Therapeutic Goals: 1. Patient will identify two positive emotions or experiences to reflect on in order to balance out negative emotions 2. Patient will label two or more emotions that they find the most difficult to experience 3. Patient will demonstrate positive conflict resolution skills through discussion and/or role plays  Summary of Patient Progress:       Therapeutic Modalities:   Cognitive Behavioral Therapy Feelings Identification Dialectical Behavioral Therapy   Yvette Rack, LCSW 02/14/2019 2:04 PM

## 2019-02-15 MED ORDER — ATORVASTATIN CALCIUM 20 MG PO TABS: 20.0000 mg | ORAL_TABLET | Freq: Every day | ORAL | 1 refills | Status: DC

## 2019-02-15 MED ORDER — BIOTENE DRY MOUTH MT LIQD: 15.0000 mL | OROMUCOSAL | 1 refills | Status: DC | PRN

## 2019-02-15 MED ORDER — CLONAZEPAM 0.5 MG PO TABS: 0.5000 mg | ORAL_TABLET | Freq: Two times a day (BID) | ORAL | 0 refills | Status: DC

## 2019-02-15 MED ORDER — QUETIAPINE FUMARATE ER 400 MG PO TB24: 400.0000 mg | ORAL_TABLET | Freq: Every day | ORAL | 1 refills | Status: AC

## 2019-02-15 MED ORDER — MONTELUKAST SODIUM 10 MG PO TABS: 10.0000 mg | ORAL_TABLET | Freq: Every day | ORAL | 1 refills | Status: DC

## 2019-02-15 MED ORDER — XYLITOL 550 MG MT DISK: 1.0000 | DISK | Freq: Every day | OROMUCOSAL | 1 refills | Status: DC

## 2019-02-15 MED ORDER — ACYCLOVIR 5 % EX OINT: TOPICAL_OINTMENT | CUTANEOUS | 1 refills | Status: DC

## 2019-02-15 MED ORDER — TRIAMTERENE-HCTZ 37.5-25 MG PO TABS: 1.0000 | ORAL_TABLET | Freq: Every day | ORAL | 1 refills | Status: DC

## 2019-02-15 MED ORDER — PANTOPRAZOLE SODIUM 40 MG PO TBEC: 40.0000 mg | DELAYED_RELEASE_TABLET | Freq: Every day | ORAL | 1 refills | Status: DC

## 2019-02-15 MED ORDER — TEMAZEPAM 15 MG PO CAPS: 15.0000 mg | ORAL_CAPSULE | Freq: Every day | ORAL | 0 refills | Status: AC

## 2019-02-15 MED ORDER — GABAPENTIN 400 MG PO CAPS: 400.0000 mg | ORAL_CAPSULE | Freq: Three times a day (TID) | ORAL | 1 refills | Status: DC

## 2019-02-15 MED ORDER — ALBUTEROL SULFATE HFA 108 (90 BASE) MCG/ACT IN AERS: 2.0000 | INHALATION_SPRAY | Freq: Four times a day (QID) | RESPIRATORY_TRACT | 1 refills | Status: DC | PRN

## 2019-02-15 MED ORDER — BECLOMETHASONE DIPROP HFA 80 MCG/ACT IN AERB: 1.0000 | INHALATION_SPRAY | Freq: Two times a day (BID) | RESPIRATORY_TRACT | 1 refills | Status: DC

## 2019-02-15 NOTE — Plan of Care (Addendum)
Pt denies depression SI, HI and AVH. Pt was educated on care plan and verbalizes understanding. Collier Bullock RN Problem: Education: Goal: Knowledge of [place] [facility] Education information/materials will improve Outcome: Adequate for Discharge Goal: Emotional status will improve Outcome: Adequate for Discharge Goal: Mental status will improve Outcome: Adequate for Discharge Goal: Verbalization of understanding the information provided will improve Outcome: Adequate for Discharge   Problem: Safety: Goal: Periods of time without injury will increase Outcome: Adequate for Discharge   Problem: Education: Goal: Utilization of techniques to improve thought processes will improve Outcome: Adequate for Discharge   Problem: Coping: Goal: Coping ability will improve Outcome: Adequate for Discharge Goal: Will verbalize feelings Outcome: Adequate for Discharge   Problem: Coping: Goal: Ability to identify and develop effective coping behavior will improve Outcome: Adequate for Discharge

## 2019-02-15 NOTE — Discharge Summary (Signed)
Physician Discharge Summary Note  Patient:  Theresa Martin is an 63 y.o., female MRN:  [id number] DOB:  1955/06/02 Patient phone:  [phone] (home)  Patient address:   [address] [place] [address],  Total Time spent with patient: 30 minutes  Date of Admission:  02/01/2019 Date of Discharge: 02/15/19  Reason for Admission:  63 year old female that presented to [facility] emergency department with her brother.  It appears that the patient had presented in a manic state.  It was reported that she has not been eating or sleeping.  Patient states that she does not remember the last time that she slept prior to getting to the hospital where she received an injection and slept for several hours.  Principal Problem: Schizo affective schizophrenia [facility]) Discharge Diagnoses: Principal Problem:   Schizo affective schizophrenia [facility])   Past Psychiatric History: Multiple hospitalizations. Patient is a poor historian and has ben on multiple medications.   Past Medical History:  Past Medical History:  Diagnosis Date  . Allergy   . Anxiety   . Depression   . Hyperlipidemia   . Hypertension   . Osteopenia after menopause   . Persistent headaches   . Pneumonia   . Postherpetic neuralgia   . Renal disorder   . Shingles outbreak 02/2014  . Skin cancer    moles on right groin and right buttock.    Past Surgical History:  Procedure Laterality Date  . CARDIOVASCULAR STRESS TEST  02/2009   treadmill stress test: Low risk  . [place]  . COLONOSCOPY     2014/2015 [place], normal  . EXPLORATORY LAPAROTOMY     laproscopy for infertility  . EYE SURGERY Bilateral    laser-correct opening b/tn cornea and iris  . MOLE REMOVAL  2017   x 2 moles   Family History:  Family History  Problem Relation Age of Onset  . Hypertension Mother   . Coronary artery disease Mother   . Heart failure Mother   . Hypertension Father   . Liver disease Sister         liver failure  . Arrhythmia Brother   . Hypertension Brother   . Cancer Neg Hx        colon   . Breast cancer Neg Hx    Family Psychiatric  History: None known Social History:  Social History   Substance and Sexual Activity  Alcohol Use No  . Alcohol/week: 0.0 standard drinks     Social History   Substance and Sexual Activity  Drug Use No    Social History   Socioeconomic History  . Marital status: Legally Separated    Spouse name: Not on file  . Number of children: 2  . Years of education: Not on file  . Highest education level: Not on file  Occupational History  . Occupation: Control and instrumentation engineer  Social Needs  . Financial resource strain: Not on file  . Food insecurity    Worry: Not on file    Inability: Not on file  . Transportation needs    Medical: Not on file    Non-medical: Not on file  Tobacco Use  . Smoking status: Never Smoker  . Smokeless tobacco: Never Used  Substance and Sexual Activity  . Alcohol use: No    Alcohol/week: 0.0 standard drinks  . Drug use: No  . Sexual activity: Not on file  Lifestyle  . Physical activity    Days per week: Not  on file    Minutes per session: Not on file  . Stress: Not on file  Relationships  . Social Herbalist on phone: Not on file    Gets together: Not on file    Attends religious service: Not on file    Active member of club or organization: Not on file    Attends meetings of clubs or organizations: Not on file    Relationship status: Not on file  Other Topics Concern  . Not on file  Social History Narrative   Regular exercise- yes, spins daily   Diet: fruits and veggies, water    No caffeine    Hospital Course:  Patient remained on the [facility] unit for 13 days. The patient stabilized on medication and therapy. Patient was discharged on Seroquel XR 400 mg QHS, Klonopin 0.5 mg p.o. twice daily, gabapentin 400 mg p.o. 3 times daily, continued home medications of aspirin EC 81 mg p.o. daily,  Lipitor 20 mg p.o. daily, Qvar 80 mcg per actuation twice daily, and Restoril 15 mg p.o. nightly. Patient has shown improvement with improved mood, affect, sleep, appetite, and interaction. Patient has attended group and participated. Patient has been seen in the day room interacting with peers and staff appropriately. Patient denies any SI/HI/AVH and contracts for safety. Patient agrees to follow up at [facility]. Patient is provided with prescriptions for their medications upon discharge.  Physical Findings: AIMS:  , ,  ,  ,    CIWA:    COWS:     Musculoskeletal: Strength & Muscle Tone: within normal limits Gait & Station: normal Patient leans: N/A  Psychiatric Specialty Exam: Physical Exam  Nursing note and vitals reviewed. Constitutional: She is oriented to person, place, and time. She appears well-developed and well-nourished.  Cardiovascular: Normal rate.  Respiratory: Effort normal.  Musculoskeletal: Normal range of motion.  Neurological: She is alert and oriented to person, place, and time.  Skin: Skin is warm.    Review of Systems  Constitutional: Negative.   HENT: Negative.   Eyes: Negative.   Respiratory: Negative.   Cardiovascular: Negative.   Gastrointestinal: Negative.   Genitourinary: Negative.   Musculoskeletal: Negative.   Skin: Negative.   Neurological: Negative.   Endo/Heme/Allergies: Negative.   Psychiatric/Behavioral: Negative.     Blood pressure (!) 147/72, pulse 73, temperature 97.6 F (36.4 C), temperature source Oral, resp. rate 18, height 5' 1"  (1.549 m), weight 49.9 kg, SpO2 100 %.Body mass index is 20.78 kg/m.   General Appearance: Fairly Groomed  Engineer, water::  Good  Speech:  Clear and [id number]  Volume:  Normal  Mood:  Euthymic  Affect:  Congruent  Thought Process:  Goal Directed  Orientation:  Full (Time, Place, and Person)  Thought Content:  Logical  Suicidal Thoughts:  No  Homicidal Thoughts:  No  Memory:   Immediate;   Fair Recent;   Fair Remote;   Fair  Judgement:  Fair  Insight:  Fair  Psychomotor Activity:  Normal  Concentration:  Fair  Recall:  [employer] of Knowledge:Fair  Language: Fair  Akathisia:  No  Handed:  Right  AIMS (if indicated):     Assets:  Desire for Improvement Housing Physical Health Resilience Social Support  Sleep:  Number of Hours: 5.75  Cognition: WNL  ADL's:  Intact   Have you used any form of tobacco in the last 30 days? (Cigarettes, Smokeless Tobacco, Cigars, and/or Pipes): No  Has this patient used any  form of tobacco in the last 30 days? (Cigarettes, Smokeless Tobacco, Cigars, and/or Pipes) Yes, No  Blood Alcohol level:  Lab Results  Component Value Date   ETH <10 01/31/2019   ETH <10 01/75/1025    Metabolic Disorder Labs:  Lab Results  Component Value Date   HGBA1C 5.3 09/30/2018   MPG 105.41 09/30/2018   No results found for: PROLACTIN Lab Results  Component Value Date   CHOL 158 02/02/2019   TRIG 49 02/02/2019   HDL 62 02/02/2019   CHOLHDL 2.5 02/02/2019   VLDL 10 02/02/2019   LDLCALC 86 02/02/2019   LDLCALC 87 09/30/2018    See Psychiatric Specialty Exam and Suicide Risk Assessment completed by Attending Physician prior to discharge.  Discharge destination:  Home  Is patient on multiple antipsychotic therapies at discharge:  No   Has Patient had three or more failed trials of antipsychotic monotherapy by history:  No  Recommended Plan for Multiple Antipsychotic Therapies: NA  Discharge Instructions    Diet - low sodium heart healthy   Complete by: As directed    Increase activity slowly   Complete by: As directed      Allergies as of 02/15/2019   No Known Allergies     Medication List    STOP taking these medications   ELDERBERRY PO   escitalopram 10 MG tablet Commonly known as: LEXAPRO   Ketoconazole-Hydrocortisone 2 & 1 % Kit   lamoTRIgine 100 MG tablet Commonly known as: LAMICTAL   MOVE FREE PO    QUEtiapine 100 MG tablet Commonly known as: SEROQUEL Replaced by: QUEtiapine 400 MG 24 hr tablet     TAKE these medications     Indication  acyclovir ointment 5 % Commonly known as: ZOVIRAX Apply topically every 3 (three) hours.  Indication: Herpes Simplex Infection of Skin & Mucous Membranes   albuterol 108 (90 Base) MCG/ACT inhaler Commonly known as: Proventil HFA Inhale 2 puffs into the lungs every 6 (six) hours as needed for wheezing or shortness of breath. What changed: how much to take  Indication: Chronic Obstructive Lung Disease   antiseptic oral rinse Liqd 15 mLs by Mouth Rinse route as needed for dry mouth.  Indication: Dry mouth   aspirin EC 81 MG tablet Take 81 mg by mouth daily.  Indication: Per PCP   atorvastatin 20 MG tablet Commonly known as: LIPITOR Take 1 tablet (20 mg total) by mouth daily.  Indication: High Amount of Fats in the Blood   beclomethasone 80 MCG/ACT inhaler Commonly known as: QVAR Inhale 1-2 puffs into the lungs 2 (two) times daily.  Indication: Chronic Obstructive Lung Disease   CALCIUM + D + K PO Take 3 each by mouth daily. Calcium 517m- Vitamin D 1000iu- K 455m Indication: Per PCP   clonazePAM 0.5 MG tablet Commonly known as: KLONOPIN Take 1 tablet (0.5 mg total) by mouth 2 (two) times daily. What changed:   how much to take  when to take this  reasons to take this  Indication: Feeling Anxious   gabapentin 400 MG capsule Commonly known as: NEURONTIN Take 1 capsule (400 mg total) by mouth 3 (three) times daily. What changed:   medication strength  how much to take  when to take this  Indication: Fibromyalgia Syndrome   Melatonin 5 MG Caps Take 1 capsule (5 mg total) by mouth at bedtime.  Indication: Trouble Sleeping   montelukast 10 MG tablet Commonly known as: SINGULAIR Take 1 tablet (10 mg total) by  mouth at bedtime.  Indication: Asthma   Naproxen Sodium 220 MG Caps Commonly known as: Aleve As needed  for headache What changed:   how much to take  how to take this  when to take this  reasons to take this  additional instructions  Indication: Headaches   pantoprazole 40 MG tablet Commonly known as: PROTONIX Take 1 tablet (40 mg total) by mouth daily. Start taking on: February 16, 2019  Indication: Gastroesophageal Reflux Disease   QUEtiapine 400 MG 24 hr tablet Commonly known as: SEROQUEL XR Take 1 tablet (400 mg total) by mouth at bedtime. Replaces: QUEtiapine 100 MG tablet  Indication: Schizophrenia   temazepam 15 MG capsule Commonly known as: RESTORIL Take 1 capsule (15 mg total) by mouth at bedtime.  Indication: Trouble Sleeping   triamterene-hydrochlorothiazide 37.5-25 MG tablet Commonly known as: MAXZIDE-25 Take 1 tablet by mouth daily.  Indication: High Blood Pressure Disorder   Xylitol 550 MG Disk Use as directed 1 tablet in the mouth or throat at bedtime.  Indication: For throat      Follow-up Information    [facility] up on 03/02/2019.   Why: You have an appointment with Rushie Chestnut scheduled for Friday 03/02/2019 at 3:20PM. It will be in person, if you prefer telepsych you will need to call them. If you need to cancel or reschedule you must give them a 24 hour notice. Thank You! Contact information: [place] [address] [phone]           Follow-up recommendations:  Continue activity as tolerated. Continue diet as recommended by your PCP. Ensure to keep all appointments with outpatient providers.  Comments:  Patient is instructed prior to discharge to: Take all medications as prescribed by his/her mental healthcare provider. Report any adverse effects and or reactions from the medicines to his/her outpatient provider promptly. Patient has been instructed & cautioned: To not engage in alcohol and or illegal drug use while on prescription medicines. In the event of worsening symptoms, patient is  instructed to call the crisis hotline, 911 and or go to the nearest ED for appropriate evaluation and treatment of symptoms. To follow-up with his/her primary care provider for your other medical issues, concerns and or health care needs.    Signed: Lowry Ram Xayden Linsey, FNP 02/15/2019, 9:46 AM

## 2019-02-15 NOTE — BHH Suicide Risk Assessment (Signed)
[facility] Discharge Suicide Risk Assessment   Principal Problem: Schizo affective schizophrenia [facility]) Discharge Diagnoses: Principal Problem:   Schizo affective schizophrenia ([place])   Total Time spent with patient: 30 minutes  Musculoskeletal: Strength & Muscle Tone: within normal limits Gait & Station: normal Patient leans: N/A  Psychiatric Specialty Exam: Review of Systems  Constitutional: Negative.   HENT: Negative.   Eyes: Negative.   Respiratory: Negative.   Cardiovascular: Negative.   Gastrointestinal: Negative.   Musculoskeletal: Negative.   Skin: Negative.   Neurological: Negative.   Psychiatric/Behavioral: Negative.     Blood pressure (!) 147/72, pulse 73, temperature 97.6 F (36.4 C), temperature source Oral, resp. rate 18, height 5\' 1"  (1.549 m), weight 49.9 kg, SpO2 100 %.Body mass index is 20.78 kg/m.  General Appearance: Fairly Groomed  Engineer, water::  Good  Speech:  Clear and [id number]  Volume:  Normal  Mood:  Euthymic  Affect:  Congruent  Thought Process:  Goal Directed  Orientation:  Full (Time, Place, and Person)  Thought Content:  Logical  Suicidal Thoughts:  No  Homicidal Thoughts:  No  Memory:  Immediate;   Fair Recent;   Fair Remote;   Fair  Judgement:  Fair  Insight:  Fair  Psychomotor Activity:  Normal  Concentration:  Fair  Recall:  [employer] of [place]  Language: Fair  Akathisia:  No  Handed:  Right  AIMS (if indicated):     Assets:  Desire for Improvement Housing Physical Health Resilience Social Support  Sleep:  Number of Hours: 5.75  Cognition: WNL  ADL's:  Intact   Mental Status Per Nursing Assessment::   On Admission:  NA  Demographic Factors:  Caucasian  Loss Factors: NA  Historical Factors: NA  Risk Reduction Factors:   Sense of responsibility to family, Positive social support and Positive therapeutic relationship  Continued Clinical Symptoms:  Bipolar Disorder:   Mixed State  Cognitive Features That  Contribute To Risk:  None    Suicide Risk:  Minimal: No identifiable suicidal ideation.  Patients presenting with no risk factors but with morbid ruminations; may be classified as minimal risk based on the severity of the depressive symptoms  Follow-up Information    [facility] up on 03/02/2019.   Why: You have an appointment with Rushie Chestnut scheduled for Friday 03/02/2019 at 3:20PM. It will be in person, if you prefer telepsych you will need to call them. If you need to cancel or reschedule you must give them a 24 hour notice. Thank You! Contact information: [place] [address] [phone]           Plan Of Care/Follow-up recommendations:  Activity:  Activity as tolerated Diet:  Regular diet Other:  Follow-up with outpatient treatment at [facility].  Continue current medication.  Alethia Berthold, MD 02/15/2019, 10:38 AM

## 2019-02-15 NOTE — Progress Notes (Signed)
  [facility] Adult Case Management Discharge Plan :  Will you be returning to the same living situation after discharge:  Yes,  lives alone At discharge, do you have transportation home?: Yes,  brother will pick up Do you have the ability to pay for your medications: Yes,  Medicare  Release of information consent forms completed and in the chart;  Patient's signature needed at discharge.  Patient to Follow up at: Follow-up Information    [facility] up on 03/02/2019.   Why: You have an appointment with Rushie Chestnut scheduled for Friday 03/02/2019 at 3:20PM. It will be in person, if you prefer telepsych you will need to call them. If you need to cancel or reschedule you must give them a 24 hour notice. Thank You! Contact information: [place] [address] [phone]           Next level of care provider has access to [place] and Suicide Prevention discussed: Yes,  [name], brother  Have you used any form of tobacco in the last 30 days? (Cigarettes, Smokeless Tobacco, Cigars, and/or Pipes): No  Has patient been referred to the Quitline?: N/A patient is not a smoker  Patient has been referred for addiction treatment: N/A  Yvette Rack, LCSW 02/15/2019, 9:33 AM

## 2019-02-15 NOTE — Tx Team (Signed)
Interdisciplinary Treatment and Diagnostic Plan Update  02/15/2019 Time of Session: 8:30AM JOSCELYNE RENVILLE MRN: [id number]  Principal Diagnosis: Schizo affective schizophrenia [facility])  Secondary Diagnoses: Principal Problem:   Schizo affective schizophrenia ([place])   Current Medications:  Current Facility-Administered Medications  Medication Dose Route Frequency Provider Last Rate Last Dose  . acetaminophen (TYLENOL) tablet 650 mg  650 mg Oral Q6H PRN Dixie Dials, MD   650 mg at 02/15/19 0903  . acyclovir ointment (ZOVIRAX) 5 %   Topical Q3H Clapacs, John T, MD      . albuterol (VENTOLIN HFA) 108 (90 Base) MCG/ACT inhaler 2 puff  2 puff Inhalation Q6H PRN Money, Lowry Ram, FNP   2 puff at 02/14/19 1245  . alum & mag hydroxide-simeth (MAALOX/MYLANTA) 200-200-20 MG/5ML suspension 30 mL  30 mL Oral Q4H PRN Cristofano, Paul A, MD      . antiseptic oral rinse (BIOTENE) solution 15 mL  15 mL Mouth Rinse PRN Clapacs, John T, MD      . aspirin EC tablet 81 mg  81 mg Oral Daily Money, Lowry Ram, FNP   81 mg at 02/15/19 0804  . atorvastatin (LIPITOR) tablet 20 mg  20 mg Oral Daily Money, Lowry Ram, FNP   20 mg at 02/15/19 0805  . beclomethasone (QVAR) 80 MCG/ACT inhaler 1-2 puff  1-2 puff Inhalation BID Suella Broad, FNP   2 puff at 02/15/19 0806  . calcium-vitamin D (OSCAL WITH D) 500-200 MG-UNIT per tablet 1 tablet  1 tablet Oral Daily Money, Lowry Ram, FNP   1 tablet at 02/14/19 0820  . clonazePAM (KLONOPIN) tablet 0.5 mg  0.5 mg Oral BID Clapacs, Madie Reno, MD   0.5 mg at 02/15/19 0803  . feeding supplement (ENSURE ENLIVE) (ENSURE ENLIVE) liquid 237 mL  237 mL Oral BID BM Clapacs, John T, MD   237 mL at 02/14/19 1417  . gabapentin (NEURONTIN) capsule 400 mg  400 mg Oral TID Sharma Covert, MD   400 mg at 02/15/19 0804  . haloperidol (HALDOL) tablet 5 mg  5 mg Oral Q8H PRN Money, Lowry Ram, FNP   5 mg at 02/06/19 0741   Or  . haloperidol lactate (HALDOL) injection 5 mg  5 mg  Intramuscular Q8H PRN Money, Lowry Ram, FNP      . LORazepam (ATIVAN) tablet 0.5 mg  0.5 mg Oral Q6H PRN Money, Lowry Ram, FNP   0.5 mg at 02/13/19 1252   Or  . LORazepam (ATIVAN) injection 1 mg  1 mg Intramuscular Q6H PRN Money, Lowry Ram, FNP   1 mg at 02/05/19 1349  . magnesium hydroxide (MILK OF MAGNESIA) suspension 30 mL  30 mL Oral Daily PRN Cristofano, Dorene Ar, MD      . Melatonin TABS 5 mg  5 mg Oral QHS Money, Lowry Ram, FNP   5 mg at 02/14/19 2130  . montelukast (SINGULAIR) tablet 10 mg  10 mg Oral QHS Money, Travis B, FNP   10 mg at 02/14/19 2130  . naproxen (NAPROSYN) tablet 375 mg  375 mg Oral BID PRN Sharma Covert, MD   375 mg at 02/14/19 1616  . pantoprazole (PROTONIX) EC tablet 40 mg  40 mg Oral Daily Sharma Covert, MD   40 mg at 02/15/19 0803  . polyvinyl alcohol (LIQUIFILM TEARS) 1.4 % ophthalmic solution 1 drop  1 drop Both Eyes BID Clapacs, Madie Reno, MD   1 drop at 02/15/19 0805  . QUEtiapine (SEROQUEL XR)  24 hr tablet 400 mg  400 mg Oral QHS Clapacs, John T, MD   400 mg at 02/14/19 2130  . sodium chloride (OCEAN) 0.65 % nasal spray 1 spray  1 spray Each Nare PRN Clapacs, Madie Reno, MD   1 spray at 02/15/19 [id number]  . temazepam (RESTORIL) capsule 15 mg  15 mg Oral QHS Clapacs, Madie Reno, MD   15 mg at 02/14/19 2130  . triamterene-hydrochlorothiazide (MAXZIDE-25) 37.5-25 MG per tablet 1 tablet  1 tablet Oral Daily Money, Lowry Ram, FNP   1 tablet at 02/15/19 0804  . Xylitol DISK 1 tablet  1 tablet Mouth/Throat QHS Clapacs, Madie Reno, MD   1 tablet at 02/13/19 2155   PTA Medications: Medications Prior to Admission  Medication Sig Dispense Refill Last Dose  . albuterol (PROVENTIL HFA) 108 (90 Base) MCG/ACT inhaler Inhale into the lungs every 6 (six) hours as needed for wheezing or shortness of breath.     Marland Kitchen aspirin EC 81 MG tablet Take 81 mg by mouth daily.     Marland Kitchen atorvastatin (LIPITOR) 20 MG tablet TAKE 1 TABLET BY MOUTH EVERY DAY (Patient taking differently: Take 20 mg by mouth daily. )  30 tablet 6   . beclomethasone (QVAR) 80 MCG/ACT inhaler Inhale 1-2 puffs into the lungs 2 (two) times daily. 10.6 g 10   . Calcium-Vitamin D-Vitamin K (CALCIUM + D + K PO) Take 3 each by mouth daily. Calcium 568m- Vitamin D 1000iu- K 415m     . clonazePAM (KLONOPIN) 0.5 MG tablet Take 0.25 mg by mouth at bedtime as needed for anxiety.   2   . ELDERBERRY PO Take 1 capsule by mouth daily.      . Marland Kitchenscitalopram (LEXAPRO) 10 MG tablet Take 10 mg by mouth daily.      . Marland Kitchenabapentin (NEURONTIN) 100 MG capsule Take 1-3 capsules (100-300 mg total) by mouth 2 (two) times daily. (Patient taking differently: Take 300 mg by mouth at bedtime. ) 180 capsule 12   . Glucosamine-Chondroitin (MOVE FREE PO) Take 1 tablet by mouth daily.     . Marland Kitchenetoconazole-Hydrocortisone 2 & 1 % KIT Apply topically as needed. Cracks on mouth     . lamoTRIgine (LAMICTAL) 100 MG tablet Take 100 mg by mouth daily.      . Melatonin 5 MG CAPS Take 1 capsule (5 mg total) by mouth at bedtime. 30 capsule 0   . montelukast (SINGULAIR) 10 MG tablet Take 1 tablet (10 mg total) by mouth at bedtime. 30 tablet 3   . Naproxen Sodium (ALEVE) 220 MG CAPS As needed for headache (Patient taking differently: Take 220 mg by mouth daily as needed (headache). ) 60 each    . QUEtiapine (SEROQUEL) 100 MG tablet Take 100 mg by mouth 3 (three) times daily. Pt takes 1 tablet in the AM and 2 tablets in the PM     . triamterene-hydrochlorothiazide (MAXZIDE-25) 37.5-25 MG tablet TAKE 1 TABLET BY MOUTH EVERY DAY (Patient taking differently: Take 1 tablet by mouth daily. 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   Physician Treatment Plan for Primary Diagnosis: [facility] affective [facility] Improvement in symptoms so as ready for discharge Improvement in symptoms so as ready for discharge   Short Term  Goals: Ability to verbalize feelings will improve Ability to demonstrate self-control will improve Ability to identify and develop effective coping behaviors will improve Ability to maintain clinical measurements within normal limits will improve Compliance with prescribed medications will improve Ability to verbalize feelings will improve Ability to demonstrate self-control will improve Ability to identify and develop effective coping behaviors will improve Ability to maintain clinical measurements within normal limits will improve Compliance with prescribed medications will improve  Medication Management: Evaluate patient's response, side effects, and tolerance of medication regimen.  Therapeutic Interventions: 1 to 1 sessions, Unit Group sessions and Medication administration.  Evaluation of Outcomes: Adequate for Discharge  Physician Treatment Plan for Secondary Diagnosis: Principal Problem:   Schizo affective schizophrenia ([place])  Long Term Goal(s): Improvement in symptoms so as ready for discharge Improvement in symptoms so as ready for discharge   Short Term Goals: Ability to verbalize feelings will improve Ability to demonstrate self-control will improve Ability to identify and develop effective coping behaviors will improve Ability to maintain clinical measurements within normal limits will improve Compliance with prescribed medications will improve Ability to verbalize feelings will improve Ability to demonstrate self-control will improve Ability to identify and develop effective coping behaviors will improve Ability to maintain clinical measurements within normal limits will improve Compliance with prescribed medications will improve     Medication Management: Evaluate patient's response, side effects, and tolerance of medication  regimen.  Therapeutic Interventions: 1 to 1 sessions, Unit Group sessions and Medication administration.  Evaluation of Outcomes: Adequate for Discharge   RN Treatment Plan for Primary Diagnosis: Schizo affective schizophrenia ([place]) Long Term Goal(s): Knowledge of disease and therapeutic regimen to maintain health will improve  Short Term Goals: Ability to demonstrate self-control, Ability to participate in decision making will improve, Ability to verbalize feelings will improve and Ability to identify and develop effective coping behaviors will improve  Medication Management: RN will administer medications as ordered by provider, will assess and evaluate patient's response and provide education to patient for prescribed medication. RN will report any adverse and/or side effects to prescribing provider.  Therapeutic Interventions: 1 on 1 counseling sessions, Psychoeducation, Medication administration, Evaluate responses to treatment, Monitor vital signs and CBGs as ordered, Perform/monitor CIWA, COWS, AIMS and Fall Risk screenings as ordered, Perform wound care treatments as ordered.  Evaluation of Outcomes: Adequate for Discharge   LCSW Treatment Plan for Primary Diagnosis: Schizo affective schizophrenia ([place]) Long Term Goal(s): Safe transition to appropriate next level of care at discharge, Engage patient in therapeutic group addressing interpersonal concerns.  Short Term Goals: Engage patient in aftercare planning with referrals and resources, Increase ability to appropriately verbalize feelings, Increase emotional regulation and Increase skills for wellness and recovery  Therapeutic Interventions: Assess for all discharge needs, 1 to 1 time with Social worker, Explore available resources and support systems, Assess for adequacy in community support network, Educate family and significant other(s) on suicide prevention, Complete Psychosocial Assessment, Interpersonal group  therapy.  Evaluation of Outcomes: Adequate for Discharge   Progress in Treatment: Attending groups: No. Participating in groups: No. Taking medication as prescribed: Yes. Toleration medication: Yes. Family/Significant other contact made: Yes, individual(s) contacted:  SPE completed withthe patient's brother.  Patient understands diagnosis: Yes. Discussing patient identified problems/goals with staff: Yes. Medical problems stabilized or resolved: Yes. Denies suicidal/homicidal ideation: Yes. Issues/concerns per patient self-inventory: No. Other: none  New problem(s) identified: No, Describe:  none  New Short Term/Long Term Goal(s): elimination of symptoms of psychosis, medication management for mood  stabilization; elimination of SI thoughts; development of comprehensive mental wellness plan.  Patient Goals:  "to go home"  Discharge Plan or Barriers: Patient reports plans to return to her home.  Patient sees a Quarry manager at [facility].  Patient reports a desire to continue with them.  Update 02/10/19- Pt not adequate for discharge, still displaying sxs. Pt will be scheduled for follow up at [facility] when discharge date established. Update 02/15/2019. Pt has an appointment scheduled with [facility] on 12/4 at 3:20PM.  Reason for Continuation of Hospitalization: none  Estimated Length of Stay: Today 02/15/2019   Recreational Therapy: Patient: N/A Patient Goal: Patient will engage in groups without prompting or encouragement from LRT x3 group sessions within 5 recreation therapy group sessions  Attendees: Patient:  02/15/2019 9:36 AM  Physician: Dr. Weber Cooks, MD 02/15/2019 9:36 AM  Nursing:  02/15/2019 9:36 AM  RN Care Manager: 02/15/2019 9:36 AM  Social Worker:  Evalina Field, LCSW 02/15/2019 9:36 AM  Recreational Therapist:  02/15/2019 9:36 AM  Other:  02/15/2019 9:36 AM  Other:  02/15/2019 9:36 AM  Other: 02/15/2019 9:36 AM    Scribe for Treatment  Team: Mariann Laster Atleigh Gruen, LCSW 02/15/2019 9:36 AM

## 2019-02-15 NOTE — Progress Notes (Addendum)
Pt denies SI, HI and AVH. Pt was educated on dc plan and verbalizes understanding. Pt received transition packet, prescriptions and belongings. Medications taken home were Sodium Chloride/ Nasal spray/ [place], Acyclovir Ointment, Albutero, QVAR, Liquid Tears. Dr.Clapacs gave the okay for medications to be taken home. Juanda Crumble her brother took home.  Collier Bullock RN

## 2019-02-28 ENCOUNTER — Ambulatory Visit (INDEPENDENT_AMBULATORY_CARE_PROVIDER_SITE_OTHER): Payer: Medicare Other | Admitting: Family Medicine

## 2019-02-28 ENCOUNTER — Ambulatory Visit (INDEPENDENT_AMBULATORY_CARE_PROVIDER_SITE_OTHER): Payer: Medicare Other

## 2019-02-28 ENCOUNTER — Other Ambulatory Visit: Payer: Self-pay

## 2019-02-28 ENCOUNTER — Encounter: Payer: Self-pay | Admitting: Family Medicine

## 2019-02-28 VITALS — BP 122/80 | HR 72 | Temp 97.9°F | Resp 16 | Ht 61.0 in | Wt 113.1 lb

## 2019-02-28 VITALS — BP 122/80 | Temp 97.9°F | Ht 61.0 in | Wt 113.1 lb

## 2019-02-28 DIAGNOSIS — K219 Gastro-esophageal reflux disease without esophagitis: Secondary | ICD-10-CM | POA: Insufficient documentation

## 2019-02-28 DIAGNOSIS — Z0000 Encounter for general adult medical examination without abnormal findings: Secondary | ICD-10-CM

## 2019-02-28 DIAGNOSIS — I639 Cerebral infarction, unspecified: Secondary | ICD-10-CM

## 2019-02-28 DIAGNOSIS — I10 Essential (primary) hypertension: Secondary | ICD-10-CM | POA: Diagnosis not present

## 2019-02-28 DIAGNOSIS — E785 Hyperlipidemia, unspecified: Secondary | ICD-10-CM

## 2019-02-28 NOTE — Assessment & Plan Note (Signed)
Chronic problem.  Tolerating statin w/o difficulty.  Recent lipid panel WNL.  No med changes at this time

## 2019-02-28 NOTE — Assessment & Plan Note (Signed)
Chronic problem.  sxs are currently well controlled.  No med changes at this time

## 2019-02-28 NOTE — Assessment & Plan Note (Signed)
Chronic problem, well controlled.  Asymptomatic.  Reviewed recent labs- no med changes at this time.

## 2019-02-28 NOTE — Patient Instructions (Signed)
Follow up in 6 months to recheck BP and cholesterol We'll notify you of your lab results and make any changes if needed Keep up the good work on diet and exercise- you look great! Call with any questions or concerns Stay Safe!  Stay Healthy! Happy Holidays!

## 2019-02-28 NOTE — Progress Notes (Addendum)
Subjective:   Theresa Martin is a 63 y.o. female who presents for Medicare Annual (Subsequent) preventive examination.  Review of Systems:   Cardiac Risk Factors include: dyslipidemia;hypertension    Objective:     Vitals: BP 122/80   Temp 97.9 F (36.6 C)   Ht 5\' 1"  (1.549 m)   Wt 113 lb 1.5 oz (51.3 kg)   BMI 21.37 kg/m   Body mass index is 21.37 kg/m.  Advanced Directives 02/28/2019 02/01/2019 01/31/2019 01/28/2019 01/22/2019 09/30/2018 09/29/2018  Does Patient Have a Medical Advance Directive? No No No;Yes No Yes No No  Type of Advance Directive - - Healthcare Power of [place] in Chart? - - No - copy requested - No - copy requested - -  Would patient like information on creating a medical advance directive? Yes (MAU/Ambulatory/Procedural Areas - Information given) No - Patient declined No - Patient declined - No - Patient declined No - Patient declined -    Tobacco Social History   Tobacco Use  Smoking Status Never Smoker  Smokeless Tobacco Never Used     Counseling given: Not Answered   Clinical Intake:  Pre-visit preparation completed: Yes  Pain : No/denies pain  Diabetes: No  How often do you need to have someone help you when you read instructions, pamphlets, or other written materials from your doctor or pharmacy?: 2 - Rarely  Interpreter Needed?: No  Comments: accompained by brother Information entered by :: Denman George LPN  Past Medical History:  Diagnosis Date  . Allergy   . Anxiety   . Depression   . Hyperlipidemia   . Hypertension   . Osteopenia after menopause   . Persistent headaches   . Pneumonia   . Postherpetic neuralgia   . Renal disorder   . Shingles outbreak 02/2014  . Skin cancer    moles on right groin and right buttock.   Past Surgical History:  Procedure Laterality Date  . CARDIOVASCULAR STRESS TEST  02/2009   treadmill stress test: Low risk   . [place]  . COLONOSCOPY     2014/2015 [place], normal  . EXPLORATORY LAPAROTOMY     laproscopy for infertility  . EYE SURGERY Bilateral    laser-correct opening b/tn cornea and iris  . MOLE REMOVAL  2017   x 2 moles   Family History  Problem Relation Age of Onset  . Hypertension Mother   . Coronary artery disease Mother   . Heart failure Mother   . Hypertension Father   . Liver disease Sister        liver failure  . Arrhythmia Brother   . Hypertension Brother   . Cancer Neg Hx        colon   . Breast cancer Neg Hx    Social History   Socioeconomic History  . Marital status: Legally Separated    Spouse name: Not on file  . Number of children: 2  . Years of education: Not on file  . Highest education level: Not on file  Occupational History  . Occupation: Control and instrumentation engineer  Social Needs  . Financial resource strain: Not on file  . Food insecurity    Worry: Not on file    Inability: Not on file  . Transportation needs    Medical: Not on file    Non-medical: Not on file  Tobacco Use  . Smoking status:  Never Smoker  . Smokeless tobacco: Never Used  Substance and Sexual Activity  . Alcohol use: No    Alcohol/week: 0.0 standard drinks  . Drug use: No  . Sexual activity: Not on file  Lifestyle  . Physical activity    Days per week: Not on file    Minutes per session: Not on file  . Stress: Not on file  Relationships  . Social Herbalist on phone: Not on file    Gets together: Not on file    Attends religious service: Not on file    Active member of club or organization: Not on file    Attends meetings of clubs or organizations: Not on file    Relationship status: Not on file  Other Topics Concern  . Not on file  Social History Narrative   Regular exercise- yes, spins daily   Diet: fruits and veggies, water    No caffeine   Lives alone     Outpatient Encounter Medications as of 02/28/2019  Medication Sig  . acyclovir  ointment (ZOVIRAX) 5 % Apply topically every 3 (three) hours.  Marland Kitchen albuterol (PROVENTIL HFA) 108 (90 Base) MCG/ACT inhaler Inhale 2 puffs into the lungs every 6 (six) hours as needed for wheezing or shortness of breath.  Marland Kitchen aspirin EC 81 MG tablet Take 81 mg by mouth daily.  Marland Kitchen atorvastatin (LIPITOR) 20 MG tablet Take 1 tablet (20 mg total) by mouth daily.  . beclomethasone (QVAR) 80 MCG/ACT inhaler Inhale 1-2 puffs into the lungs 2 (two) times daily.  . Calcium-Vitamin D-Vitamin K (CALCIUM + D + K PO) Take 3 each by mouth daily. Calcium 500mg - Vitamin D 1000iu- K 40mg    . clonazePAM (KLONOPIN) 0.5 MG tablet Take 1 tablet (0.5 mg total) by mouth 2 (two) times daily.  Marland Kitchen gabapentin (NEURONTIN) 400 MG capsule Take 1 capsule (400 mg total) by mouth 3 (three) times daily.  . Melatonin 5 MG CAPS Take by mouth.  . montelukast (SINGULAIR) 10 MG tablet Take 1 tablet (10 mg total) by mouth at bedtime.  . Naproxen Sodium (ALEVE) 220 MG CAPS As needed for headache (Patient taking differently: Take 220 mg by mouth daily as needed (headache). )  . pantoprazole (PROTONIX) 40 MG tablet Take 1 tablet (40 mg total) by mouth daily.  . QUEtiapine (SEROQUEL XR) 400 MG 24 hr tablet Take 1 tablet (400 mg total) by mouth at bedtime.  . temazepam (RESTORIL) 15 MG capsule Take 1 capsule (15 mg total) by mouth at bedtime.  . triamterene-hydrochlorothiazide (MAXZIDE-25) 37.5-25 MG tablet Take 1 tablet by mouth daily.   No facility-administered encounter medications on file as of 02/28/2019.     Activities of Daily Living In your present state of health, do you have any difficulty performing the following activities: 02/28/2019 02/28/2019  Hearing? N N  Vision? N N  Difficulty concentrating or making decisions? Y N  Comment with changes in pych medications -  Walking or climbing stairs? N N  Dressing or bathing? N N  Doing errands, shopping? N N  Preparing Food and eating ? N -  Using the Toilet? N -  In the past six  months, have you accidently leaked urine? N -  Do you have problems with loss of bowel control? N -  Managing your Medications? N -  Managing your Finances? N -  Housekeeping or managing your Housekeeping? N -  Some recent data might be hidden    Patient Care Team:  [name], MD as PCP - General (Family Medicine) Dalia Heading, CNM as Midwife (Certified Nurse Midwife) Bary Castilla, Forest Gleason, MD (General Surgery) Ulis Rias, MD (Psychiatry) Penni Bombard, MD as Consulting Physician (Neurology) Sable Feil, MD as Consulting Physician (Gastroenterology) Rushie Chestnut (Psychiatry) Mickle Plumb [facility]) Dermatology, [facility] Skin & (Dermatology) Gerda Diss, DO as Consulting Physician (Family Medicine) Debbora Presto, NP as Nurse Practitioner (Neurology) Flora Lipps, MD as Consulting Physician (Pulmonary Disease)    Assessment:   This is a routine wellness examination for Theresa Martin.  Exercise Activities and Dietary recommendations Current Exercise Habits: Home exercise routine, Type of exercise: walking, Time (Minutes): 30, Frequency (Times/Week): 7, Weekly Exercise (Minutes/Week): 210, Intensity: Mild  Goals    . Patient Stated     Maintain current health by staying active and eating well.     . Patient Stated     Regain independence     . patient states (pt-stated)     Maintain current health by staying active and making healthy food choices.        Fall Risk Fall Risk  02/28/2019 02/28/2019 01/26/2019 12/11/2018 10/27/2018  Falls in the past year? 0 0 0 0 0  Number falls in past yr: - 0 0 0 0  Injury with Fall? 0 0 0 0 0  Risk for fall due to : Medication side effect - - - -  Follow up Falls evaluation completed;Education provided;Falls prevention discussed Falls evaluation completed Falls evaluation completed - -   Is the patient's home free of loose throw rugs in walkways, pet beds, electrical cords, etc?   yes      Grab  bars in the bathroom? yes      Handrails on the stairs?   yes      Adequate lighting?   yes  Timed Get Up and Go performed: completed and within normal timeframe; no gait abnormalities noted   Depression Screen PHQ 2/9 Scores 02/28/2019 01/26/2019 12/11/2018 10/27/2018  PHQ - 2 Score 0 0 0 0  PHQ- 9 Score 0 0 0 0     Cognitive Function-no cognitive concerns at this time  Cognitive Testing  Alert? Yes         Normal Appearance? Yes  Oriented to person? Yes           Place? Yes  Time? Yes  Recall of three objects? Yes  Can perform simple calculations? Yes  Displays appropriate judgment? Yes  Can read the correct time from a watch face? Yes   MMSE - Mini Mental State Exam 06/03/2016  Orientation to time 5  Orientation to Place 5  Registration 3  Attention/ Calculation 5  Recall 3  Language- name 2 objects 2  Language- repeat 1  Language- follow 3 step command 3  Language- read & follow direction 1  Write a sentence 1  Copy design 1  Total score 30        Immunization History  Administered Date(s) Administered  . Influenza,inj,Quad PF,6+ Mos 01/19/2017, 01/26/2019  . Pneumococcal Polysaccharide-23 12/25/2018  . Tdap 07/27/2012  . Zoster 09/26/2014  . Zoster Recombinat (Shingrix) 05/04/2018, 10/26/2018    Qualifies for Shingles Vaccine? Shingrix completed  Screening Tests Health Maintenance  Topic Date Due  . COLONOSCOPY  01/22/2020  . MAMMOGRAM  01/28/2020  . PAP SMEAR-Modifier  02/20/2021  . TETANUS/TDAP  07/28/2022  . INFLUENZA VACCINE  Completed  . Hepatitis C Screening  Completed  . HIV Screening  Completed  Cancer Screenings: Lung: Low Dose CT Chest recommended if Age 24-80 years, 30 pack-year currently smoking OR have quit w/in 15years. Patient does not qualify. Breast:  Up to date on Mammogram? Yes; scheduled for 03/05/19 Up to date of Bone Density/Dexa? Yes Colorectal: colonoscopy 01/21/10    Plan:  I have personally reviewed and addressed the  Medicare Annual Wellness questionnaire and have noted the following in the patient's chart:  A. Medical and social history B. Use of alcohol, tobacco or illicit drugs  C. Current medications and supplements D. Functional ability and status E.  Nutritional status F.  Physical activity G. Advance directives H. List of other physicians I.  Hospitalizations, surgeries, and ER visits in previous 12 months J.  [place] such as hearing and vision if needed, cognitive and depression L. Referrals, records requested, and appointments- none   In addition, I have reviewed and discussed with patient certain preventive protocols, quality metrics, and best practice recommendations. A written personalized care plan for preventive services as well as general preventive health recommendations were provided to patient.   Signed,  Denman George, LPN  Nurse Health Advisor    Nurse Notes: Patient expresses increased copay with Qvar.  I will check to see if there is a manufacturer program available.   Reviewed documentation provided and agree w/ above.  Annye Asa, MD

## 2019-02-28 NOTE — Progress Notes (Addendum)
   Subjective:    Patient ID: Theresa Martin, female    DOB: Dec 29, 1955, 63 y.o.   MRN: [id number]  HPI Hyperlipidemia- chronic problem, on Lipitor 20mg  daily.  Recent labs show total cholesterol of 158 and LDL of 86.  HDL 62.  GERD- ongoing issue.  Currently on Protonix 40mg  daily.  Currently sxs are well controlled.  HTN- chronic problem, on Triamterene HCTZ 37.5/25mg  daily w/ good control.  Denies CP, SOB, HAs, visual changes, edema.  Recent labs show normal K+ (3.5) and normal Cr 0.57   Review of Systems For ROS see HPI   This visit occurred during the SARS-CoV-2 public health emergency.  Safety protocols were in place, including screening questions prior to the visit, additional usage of staff PPE, and extensive cleaning of exam room while observing appropriate contact time as indicated for disinfecting solutions.       Objective:   Physical Exam Vitals signs reviewed.  Constitutional:      General: She is not in acute distress.    Appearance: Normal appearance. She is well-developed.  HENT:     Head: Normocephalic and atraumatic.  Eyes:     Conjunctiva/sclera: Conjunctivae normal.     Pupils: Pupils are equal, round, and reactive to light.  Neck:     Musculoskeletal: Normal range of motion and neck supple.     Thyroid: No thyromegaly.  Cardiovascular:     Rate and Rhythm: Normal rate and regular rhythm.     Heart sounds: Normal heart sounds. No murmur.  Pulmonary:     Effort: Pulmonary effort is normal. No respiratory distress.     Breath sounds: Normal breath sounds.  Abdominal:     General: There is no distension.     Palpations: Abdomen is soft.     Tenderness: There is no abdominal tenderness.  Lymphadenopathy:     Cervical: No cervical adenopathy.  Skin:    General: Skin is warm and dry.  Neurological:     Mental Status: She is alert and oriented to person, place, and time.  Psychiatric:        Behavior: Behavior normal.           Assessment &  Plan:

## 2019-02-28 NOTE — Patient Instructions (Addendum)
Theresa Martin , Thank you for taking time to come for your Medicare Wellness Visit. I appreciate your ongoing commitment to your health goals. Please review the following plan we discussed and let me know if I can assist you in the future.   Screening recommendations/referrals: Colorectal Screening: up to date; last 01/21/10 Mammogram: up to date; scheduled for 03/05/19 Bone Density: up to date; last 06/20/17   Vision and Dental Exams: Recommended annual ophthalmology exams for early detection of glaucoma and other disorders of the eye Recommended annual dental exams for proper oral hygiene  Vaccinations: Influenza vaccine: completed 01/26/19 Pneumococcal vaccine: up to date; last 12/25/18 Tdap vaccine: up to date; last 07/27/12 Shingles vaccine: completed Shingrix   Advanced directives: Advance directives discussed with you today. I have provided a copy for you to complete at home and have notarized. Once this is complete please bring a copy in to our office so we can scan it into your chart.  Goals: Recommend to drink at least 6-8 8oz glasses of water per day and consume a balanced diet rich in fresh fruits and vegetables.   Next appointment: Please schedule your Annual Wellness Visit with your Nurse Health Advisor in one year.  Preventive Care 40-64 Years, Female Preventive care refers to lifestyle choices and visits with your health care provider that can promote health and wellness. What does preventive care include?  A yearly physical exam. This is also called an annual well check.  Dental exams once or twice a year.  Routine eye exams. Ask your health care provider how often you should have your eyes checked.  Personal lifestyle choices, including:  Daily care of your teeth and gums.  Regular physical activity.  Eating a healthy diet.  Avoiding tobacco and drug use.  Limiting alcohol use.  Practicing safe sex.  Taking low-dose aspirin daily starting at age 23 if  recommended by your health care provider.  Taking vitamin and mineral supplements as recommended by your health care provider. What happens during an annual well check? The services and screenings done by your health care provider during your annual well check will depend on your age, overall health, lifestyle risk factors, and family history of disease. Counseling  Your health care provider may ask you questions about your:  Alcohol use.  Tobacco use.  Drug use.  Emotional well-being.  Home and relationship well-being.  Sexual activity.  Eating habits.  Work and work Statistician.  Method of birth control.  Menstrual cycle.  Pregnancy history. Screening  You may have the following tests or measurements:  Height, weight, and BMI.  Blood pressure.  Lipid and cholesterol levels. These may be checked every 5 years, or more frequently if you are over 36 years old.  Skin check.  Lung cancer screening. You may have this screening every year starting at age 74 if you have a 30-pack-year history of smoking and currently smoke or have quit within the past 15 years.  Fecal occult blood test (FOBT) of the stool. You may have this test every year starting at age 12.  Flexible sigmoidoscopy or colonoscopy. You may have a sigmoidoscopy every 5 years or a colonoscopy every 10 years starting at age 70.  Hepatitis C blood test.  Hepatitis B blood test.  Sexually transmitted disease (STD) testing.  Diabetes screening. This is done by checking your blood sugar (glucose) after you have not eaten for a while (fasting). You may have this done every 1-3 years.  Mammogram. This may be  done every 1-2 years. Talk to your health care provider about when you should start having regular mammograms. This may depend on whether you have a family history of breast cancer.  BRCA-related cancer screening. This may be done if you have a family history of breast, ovarian, tubal, or peritoneal  cancers.  Pelvic exam and Pap test. This may be done every 3 years starting at age 21. Starting at age 30, this may be done every 5 years if you have a Pap test in combination with an HPV test.  Bone density scan. This is done to screen for osteoporosis. You may have this scan if you are at high risk for osteoporosis. Discuss your test results, treatment options, and if necessary, the need for more tests with your health care provider. Vaccines  Your health care provider may recommend certain vaccines, such as:  Influenza vaccine. This is recommended every year.  Tetanus, diphtheria, and acellular pertussis (Tdap, Td) vaccine. You may need a Td booster every 10 years.  Zoster vaccine. You may need this after age 60.  Pneumococcal 13-valent conjugate (PCV13) vaccine. You may need this if you have certain conditions and were not previously vaccinated.  Pneumococcal polysaccharide (PPSV23) vaccine. You may need one or two doses if you smoke cigarettes or if you have certain conditions. Talk to your health care provider about which screenings and vaccines you need and how often you need them. This information is not intended to replace advice given to you by your health care provider. Make sure you discuss any questions you have with your health care provider. Document Released: 04/11/2015 Document Revised: 12/03/2015 Document Reviewed: 01/14/2015 Elsevier Interactive Patient Education  2017 [employer]    Fall Prevention in the Home Falls can cause injuries. They can happen to people of all ages. There are many things you can do to make your home safe and to help prevent falls. What can I do on the outside of my home?  Regularly fix the edges of walkways and driveways and fix any cracks.  Remove anything that might make you trip as you walk through a door, such as a raised step or threshold.  Trim any bushes or trees on the path to your home.  Use bright outdoor lighting.  Clear  any walking paths of anything that might make someone trip, such as rocks or tools.  Regularly check to see if handrails are loose or broken. Make sure that both sides of any steps have handrails.  Any raised decks and porches should have guardrails on the edges.  Have any leaves, snow, or ice cleared regularly.  Use sand or salt on walking paths during winter.  Clean up any spills in your garage right away. This includes oil or grease spills. What can I do in the bathroom?  Use night lights.  Install grab bars by the toilet and in the tub and shower. Do not use towel bars as grab bars.  Use non-skid mats or decals in the tub or shower.  If you need to sit down in the shower, use a plastic, non-slip stool.  Keep the floor dry. Clean up any water that spills on the floor as soon as it happens.  Remove soap buildup in the tub or shower regularly.  Attach bath mats securely with double-sided non-slip rug tape.  Do not have throw rugs and other things on the floor that can make you trip. What can I do in the bedroom?  Use night lights.    Make sure that you have a light by your bed that is easy to reach.  Do not use any sheets or blankets that are too big for your bed. They should not hang down onto the floor.  Have a firm chair that has side arms. You can use this for support while you get dressed.  Do not have throw rugs and other things on the floor that can make you trip. What can I do in the kitchen?  Clean up any spills right away.  Avoid walking on wet floors.  Keep items that you use a lot in easy-to-reach places.  If you need to reach something above you, use a strong step stool that has a grab bar.  Keep electrical cords out of the way.  Do not use floor polish or wax that makes floors slippery. If you must use wax, use non-skid floor wax.  Do not have throw rugs and other things on the floor that can make you trip. What can I do with my stairs?  Do not  leave any items on the stairs.  Make sure that there are handrails on both sides of the stairs and use them. Fix handrails that are broken or loose. Make sure that handrails are as long as the stairways.  Check any carpeting to make sure that it is firmly attached to the stairs. Fix any carpet that is loose or worn.  Avoid having throw rugs at the top or bottom of the stairs. If you do have throw rugs, attach them to the floor with carpet tape.  Make sure that you have a light switch at the top of the stairs and the bottom of the stairs. If you do not have them, ask someone to add them for you. What else can I do to help prevent falls?  Wear shoes that:  Do not have high heels.  Have rubber bottoms.  Are comfortable and fit you well.  Are closed at the toe. Do not wear sandals.  If you use a stepladder:  Make sure that it is fully opened. Do not climb a closed stepladder.  Make sure that both sides of the stepladder are locked into place.  Ask someone to hold it for you, if possible.  Clearly mark and make sure that you can see:  Any grab bars or handrails.  First and last steps.  Where the edge of each step is.  Use tools that help you move around (mobility aids) if they are needed. These include:  Canes.  Walkers.  Scooters.  Crutches.  Turn on the lights when you go into a dark area. Replace any light bulbs as soon as they burn out.  Set up your furniture so you have a clear path. Avoid moving your furniture around.  If any of your floors are uneven, fix them.  If there are any pets around you, be aware of where they are.  Review your medicines with your doctor. Some medicines can make you feel dizzy. This can increase your chance of falling. Ask your doctor what other things that you can do to help prevent falls. This information is not intended to replace advice given to you by your health care provider. Make sure you discuss any questions you have with  your health care provider. Document Released: 01/09/2009 Document Revised: 08/21/2015 Document Reviewed: 04/19/2014 Elsevier Interactive Patient Education  2017 [employer].

## 2019-03-02 ENCOUNTER — Encounter: Payer: Self-pay | Admitting: Family Medicine

## 2019-03-02 ENCOUNTER — Other Ambulatory Visit: Payer: Self-pay

## 2019-03-02 ENCOUNTER — Ambulatory Visit
Admission: RE | Admit: 2019-03-02 | Discharge: 2019-03-02 | Disposition: A | Discharge status: Home / Self Care | Payer: Medicare Other | Source: Ambulatory Visit | Attending: Family Medicine | Admitting: Family Medicine

## 2019-03-02 DIAGNOSIS — Z1231 Encounter for screening mammogram for malignant neoplasm of breast: Secondary | ICD-10-CM

## 2019-03-09 ENCOUNTER — Other Ambulatory Visit: Payer: Self-pay | Admitting: Diagnostic Neuroimaging

## 2019-03-13 ENCOUNTER — Other Ambulatory Visit: Payer: Self-pay | Admitting: Diagnostic Neuroimaging

## 2019-03-14 ENCOUNTER — Other Ambulatory Visit: Payer: Self-pay | Admitting: Diagnostic Neuroimaging

## 2019-03-14 ENCOUNTER — Telehealth: Payer: Self-pay | Admitting: Family Medicine

## 2019-03-14 MED ORDER — PANTOPRAZOLE SODIUM 40 MG PO TBEC: 40.0000 mg | DELAYED_RELEASE_TABLET | Freq: Every day | ORAL | 1 refills | Status: DC

## 2019-03-14 NOTE — Telephone Encounter (Signed)
Medication filled to pharmacy as requested.   

## 2019-03-14 NOTE — Telephone Encounter (Signed)
Pt called in asking for a refill on the Pantoprazole 40 mg tabs. Pt uses [facility] on [address] in [place]. She was prescribed the medication while in the hosp. [facility]

## 2019-03-21 ENCOUNTER — Encounter: Payer: Self-pay | Admitting: Diagnostic Neuroimaging

## 2019-03-21 ENCOUNTER — Other Ambulatory Visit: Payer: Self-pay

## 2019-03-21 ENCOUNTER — Ambulatory Visit (INDEPENDENT_AMBULATORY_CARE_PROVIDER_SITE_OTHER): Payer: Medicare Other | Admitting: Diagnostic Neuroimaging

## 2019-03-21 ENCOUNTER — Encounter

## 2019-03-21 VITALS — BP 146/77 | HR 80 | Temp 97.3°F | Ht 61.0 in | Wt 114.0 lb

## 2019-03-21 DIAGNOSIS — G43109 Migraine with aura, not intractable, without status migrainosus: Secondary | ICD-10-CM

## 2019-03-21 DIAGNOSIS — I639 Cerebral infarction, unspecified: Secondary | ICD-10-CM

## 2019-03-21 MED ORDER — GABAPENTIN 400 MG PO CAPS: 400.0000 mg | ORAL_CAPSULE | Freq: Three times a day (TID) | ORAL | 12 refills | Status: DC

## 2019-03-21 MED ORDER — GABAPENTIN 100 MG PO CAPS: 200.0000 mg | ORAL_CAPSULE | Freq: Two times a day (BID) | ORAL | 12 refills | Status: DC

## 2019-03-21 NOTE — Progress Notes (Signed)
[facility]  PATIENT: Theresa Martin DOB: 29-Apr-1955  REFERRING CLINICIAN:  HISTORY FROM: patient REASON FOR VISIT: follow up   HISTORICAL  CHIEF COMPLAINT:  Chief Complaint  Patient presents with  . Migraine    rm 6, brotherJuanda Crumble,  [place] for severe headache, not sleeping , several hospital visits; now back on mood meds and doing better, no headache now x 2 weeks"    HISTORY OF PRESENT ILLNESS:   UPDATE (03/21/19, VRP): Since last visit, doing well until COVID pandemic. She weaned herself off her psych meds, then mania, psychosis . Then headaches worsened. Admitted to behavioral healthNow psych issues are better, and headache resolved since last month.   UPDATE (01/30/18, VRP): Since last visit, doing worse with HA (14-15 per month). More stress levels than last visit. Burning sensation in scalp continues.  UPDATE (08/24/17, VRP): Since last visit, doing well. Tolerating propranolol. Avg 4-13 migraine per month. No alleviating or aggravating factors.   Separately, had right thoracic burning pain (no rash) in fall 2018, and was tx'd empirically with acyclovir. Then with right scalp burning pain last week; also with chronic left scalp sensitivity with migraines.  UPDATE 08/24/16: Since last visit, avg 2-3 HA per month. Tolerating meds. No new issues. Mood stable. Weight improved.   UPDATE 02/23/16: Since last visit has ~ 1-5 HA per month. Tolerating propranolol + OTC tylenol/aleve for HA mgmt.   UPDATE 08/19/15: Since last visit, avg 1-10 HA per month. Usually with stress. Some more wt gain noted (~10lbs). Also had a a laser eye procedure for angle closure glaucoma, now improved.  UPDATE 04/15/15: Since last visit, doing well. No more migraine since 02/28/15. Propranolol is helping prevent HA. Mood stable.  UPDATE 01/13/15: Since last visit, having more headaches, and more fluid retention. Now being tapered off gabapentin by psychiatry due to side effects.    UPDATE 09/02/14: Doing well. Avg 3-6 days HA per month. Some HA are mild, some are severe. Taking gabapentin 200mg  TID. Works out every day at [employer]. Overall mood is better as well.  UPDATE 05/17/14: Since last visit, patient had some psychiatry issues, was managed by behavioral health, and her headaches significant improved. She was doing well for several years and did not follow-up in our clinic. Patient here with her father today for this visit. Patient's father notes that her headaches seem to be significantly associated with her stress levels. In the last few months her stress levels have increased significantly. Her stress levels related to her ex-husband and her daughters, and some family issues. Patient having intermittent, almost daily left-sided headaches, with numbness and tingling in the left scalp, typically in the evening when she is home. During the daytime patient stays active, works out several times a week, and does better. Patient had been on gabapentin 100 mg at bedtime for several years. Her psychiatrist increased this to 2 and then 3 capsules at bedtime. When she did 3 capsules at bedtime, her headache type symptoms paradoxical he worsened. She then went back to taking one capsule at bedtime. Strangely, she reports that she was told she was able to take this medication (gabapentin) as needed as well and for the last few weeks has been taking one capsule at bedtime, followed by 1-2 capsules every hour, throughout the night, taking up to 10-14 capsules in a 12 hour period.   PRIOR HPI (11/04/08 - 06/03/09, VRP): 63 year old right-handed female with history of high blood pressure, depression and anxiety presenting for  evaluation of chronic headaches and abnormal MRI scan.  On October 26, 2008 the patient presented to the emergency room for right-sided chest pain and was diagnosed with pneumonia.   At this time her headaches worsened in severity.  Upon review of prior MRIs demonstrating  microvascular gliosis, patient was referred to our neurology clinic for futher evaluation. Patient's headaches began in 2005 consisting of a "dull pressure like sensation" over the top of her head. Occasionally these are associated with mild nausea without vomiting as well as intermittent left facial numbness.  Patient had dull nagging low level headaches on a daily basis with daily flareups involving a hot sensation over her scalp.  Sometimes this sensation starts as posterior neck pain that moves up the back of her head. She was initially evaluated with MRI scans in 2005 and 2006 and started on topiramate 100 mg at bedtime.   In addition she was taking Advil and Tylenol over-the-counter almost a daily basis.   During flareups the patient would have to lay down in place ice packs over her eyes and the top of her head.   Patient's headaches are aggravated by lack of sleep or stress. No food triggers noted.  Her other physicians decided to taper her off of NSAIDs, tylenol and also her topiramate. Patient had MRI of the head and neck, found to have some nonspecific white matter lesions. Patient had lumbar puncture and additional blood testing, but multiple sclerosis was ruled out. Patient was treated with several occipital nerve blocks with good results.   REVIEW OF SYSTEMS: Full 14 system review of systems performed and negative except for: as per HPI.   ALLERGIES: No Known Allergies  HOME MEDICATIONS: Outpatient Medications Prior to Visit  Medication Sig Dispense Refill  . albuterol (PROVENTIL HFA) 108 (90 Base) MCG/ACT inhaler Inhale 2 puffs into the lungs every 6 (six) hours as needed for wheezing or shortness of breath. 6.7 g 1  . aspirin EC 81 MG tablet Take 81 mg by mouth daily.    Marland Kitchen atorvastatin (LIPITOR) 20 MG tablet Take 1 tablet (20 mg total) by mouth daily. 30 tablet 1  . beclomethasone (QVAR) 80 MCG/ACT inhaler Inhale 1-2 puffs into the lungs 2 (two) times daily. 10.6 g 1  . Calcium-Vitamin  D-Vitamin K (CALCIUM + D + K PO) Take 3 each by mouth daily. Calcium 500mg - Vitamin D 1000iu- K 40mg      . clonazePAM (KLONOPIN) 0.5 MG tablet Take 1 tablet (0.5 mg total) by mouth 2 (two) times daily. 60 tablet 0  . gabapentin (NEURONTIN) 100 MG capsule 200 mg 2 (two) times daily.    . montelukast (SINGULAIR) 10 MG tablet Take 1 tablet (10 mg total) by mouth at bedtime. 30 tablet 1  . pantoprazole (PROTONIX) 40 MG tablet Take 1 tablet (40 mg total) by mouth daily. 90 tablet 1  . QUEtiapine (SEROQUEL XR) 400 MG 24 hr tablet Take 1 tablet (400 mg total) by mouth at bedtime. 30 tablet 1  . temazepam (RESTORIL) 15 MG capsule Take 1 capsule (15 mg total) by mouth at bedtime. 30 capsule 0  . triamterene-hydrochlorothiazide (MAXZIDE-25) 37.5-25 MG tablet Take 1 tablet by mouth daily. 30 tablet 1  . acyclovir ointment (ZOVIRAX) 5 % Apply topically every 3 (three) hours. 15 g 1  . gabapentin (NEURONTIN) 400 MG capsule Take 1 capsule (400 mg total) by mouth 3 (three) times daily. 90 capsule 1  . Melatonin 5 MG CAPS Take by mouth.    Marland Kitchen  Naproxen Sodium (ALEVE) 220 MG CAPS As needed for headache (Patient taking differently: Take 220 mg by mouth daily as needed (headache). ) 60 each    No facility-administered medications prior to visit.    PAST MEDICAL HISTORY: Past Medical History:  Diagnosis Date  . Allergy   . Anxiety   . Depression   . Hyperlipidemia   . Hypertension   . Osteopenia after menopause   . Persistent headaches   . Pneumonia   . Postherpetic neuralgia   . Renal disorder   . Shingles outbreak 02/2014  . Skin cancer    moles on right groin and right buttock.    PAST SURGICAL HISTORY: Past Surgical History:  Procedure Laterality Date  . CARDIOVASCULAR STRESS TEST  02/2009   treadmill stress test: Low risk  . [place]  . COLONOSCOPY     2014/2015 [place], normal  . EXPLORATORY LAPAROTOMY     laproscopy for infertility  . EYE SURGERY Bilateral     laser-correct opening b/tn cornea and iris  . MOLE REMOVAL  2017   x 2 moles    FAMILY HISTORY: Family History  Problem Relation Age of Onset  . Hypertension Mother   . Coronary artery disease Mother   . Heart failure Mother   . Hypertension Father   . Liver disease Sister        liver failure  . Arrhythmia Brother   . Hypertension Brother   . Cancer Neg Hx        colon   . Breast cancer Neg Hx     SOCIAL HISTORY:  Social History   Socioeconomic History  . Marital status: Legally Separated    Spouse name: Not on file  . Number of children: 2  . Years of education: Not on file  . Highest education level: Not on file  Occupational History  . Occupation: Control and instrumentation engineer  Tobacco Use  . Smoking status: Never Smoker  . Smokeless tobacco: Never Used  Substance and Sexual Activity  . Alcohol use: No    Alcohol/week: 0.0 standard drinks  . Drug use: No  . Sexual activity: Not on file  Other Topics Concern  . Not on file  Social History Narrative   Regular exercise- yes, spins daily   Diet: fruits and veggies, water    No caffeine   Lives alone    Social Determinants of Health   Financial Resource Strain:   . Difficulty of Paying Living Expenses: Not on file  Food Insecurity:   . Worried About Charity fundraiser in the Last Year: Not on file  . Ran Out of Food in the Last Year: Not on file  Transportation Needs:   . Lack of Transportation (Medical): Not on file  . Lack of Transportation (Non-Medical): Not on file  Physical Activity:   . Days of Exercise per Week: Not on file  . Minutes of Exercise per Session: Not on file  Stress:   . Feeling of Stress : Not on file  Social Connections:   . Frequency of Communication with Friends and Family: Not on file  . Frequency of Social Gatherings with Friends and Family: Not on file  . Attends Religious Services: Not on file  . Active Member of Clubs or Organizations: Not on file  . Attends Archivist  Meetings: Not on file  . Marital Status: Not on file  Intimate Partner Violence:   . Fear of Current or Ex-Partner: Not  on file  . Emotionally Abused: Not on file  . Physically Abused: Not on file  . Sexually Abused: Not on file     PHYSICAL EXAM  Vitals:   03/21/19 1300  BP: (!) 146/77  Pulse: 80  Temp: (!) 97.3 F (36.3 C)  Weight: 114 lb (51.7 kg)  Height: 5\' 1"  (1.549 m)   Wt Readings from Last 3 Encounters:  03/21/19 114 lb (51.7 kg)  02/28/19 113 lb 1.5 oz (51.3 kg)  02/28/19 113 lb 2 oz (51.3 kg)   Body mass index is 21.54 kg/m.  No exam data present  MMSE - Mini Mental State Exam 06/03/2016  Orientation to time 5  Orientation to Place 5  Registration 3  Attention/ Calculation 5  Recall 3  Language- name 2 objects 2  Language- repeat 1  Language- follow 3 step command 3  Language- read & follow direction 1  Write a sentence 1  Copy design 1  Total score 30    GENERAL EXAM: Patient is in no distress; well developed, nourished and groomed; neck is supple  CARDIOVASCULAR: Regular rate and rhythm, no murmurs, no carotid bruits; BRADYCARDIA  NEUROLOGIC: MENTAL STATUS: awake, alert, language fluent, comprehension intact, naming intact, fund of knowledge appropriate CRANIAL NERVE: pupils equal and reactive to light, visual fields full to confrontation, extraocular muscles intact, no nystagmus, facial sensation and strength symmetric, hearing intact, palate elevates symmetrically, uvula midline, shoulder shrug symmetric, tongue midline. MOTOR: normal bulk and tone, full strength in the BUE, BLE SENSORY: normal and symmetric to light touch, temperature, vibration COORDINATION: finger-nose-finger, fine finger movements normal REFLEXES: deep tendon reflexes present and symmetric GAIT/STATION: narrow based gait; romberg is negative    DIAGNOSTIC DATA (LABS, IMAGING, TESTING) - I reviewed patient records, labs, notes, testing and imaging myself where  available.  Lab Results  Component Value Date   WBC 6.9 01/31/2019   HGB 12.3 01/31/2019   HCT 36.8 01/31/2019   MCV 91.8 01/31/2019   PLT 326 01/31/2019      Component Value Date/Time   NA 137 02/11/2019 0702   K 3.5 02/11/2019 0702   CL 99 02/11/2019 0702   CO2 29 02/11/2019 0702   GLUCOSE 114 (H) 02/11/2019 0702   BUN 16 02/11/2019 0702   CREATININE 0.57 02/11/2019 0702   CREATININE 0.90 06/08/2017 1534   CALCIUM 9.1 02/11/2019 0702   PROT 7.4 02/02/2019 0810   ALBUMIN 4.0 02/02/2019 0810   AST 38 02/02/2019 0810   ALT 25 02/02/2019 0810   ALKPHOS 76 02/02/2019 0810   BILITOT 0.7 02/02/2019 0810   GFRNONAA >60 02/11/2019 0702   GFRAA >60 02/11/2019 0702   Lab Results  Component Value Date   CHOL 158 02/02/2019   HDL 62 02/02/2019   LDLCALC 86 02/02/2019   TRIG 49 02/02/2019   CHOLHDL 2.5 02/02/2019   Lab Results  Component Value Date   HGBA1C 5.3 09/30/2018   Lab Results  Component Value Date   [id number] 01/16/2010   Lab Results  Component Value Date   TSH 1.244 02/02/2019    11/13/08 LP - opening pressure 7cm H2O; WBC 1, RBC 0, glucose 62, protein 26, OCB (2 is CSF, not seen in serum), IgG index 0.5 (normal), lyme PCR neg, EBV PCR neg  11/13/08 VEP - normal  11/08/08 MRI cervical - normal  11/08/08 MRI brain (with and without contrast) - multiple supratentorial, periventricular and juxtacortical white matter lesions which may represent chronic demyelinating plaques or perivascular gliosis.  No abnormal enhancement on postcontrast views.   08/28/17 MRI brain  1.  Scattered T2/FLAIR hyperintense foci predominantly in the deep and subcortical white matter.  This is a nonspecific finding and most likely represents chronic microvascular ischemic change.  The pattern is not typical for demyelination.  The foci are not acute and they do not enhance after contrast.  When compared to the MRI dated 05/20/2004, there has been only slight progression in the number or  size of the foci. 2.  There is a normal enhancement pattern and there are no acute findings.   09/30/18 MRI brain [I reviewed images myself and agree with interpretation. -VRP]  - No acute or subacute infarction. - Numerous scattered foci of T2 and FLAIR signal within the cerebral hemispheric deep and subcortical white matter. The differential diagnosis on the basis of the imaging would be demyelinating disease versus small-vessel disease. Findings appear stable since June of last year but are progressive since 2006. Given the history of hypertension and hyperlipidemia, small-vessel disease is probably more likely    ASSESSMENT AND PLAN  63 y.o. year old female here with mixed tension and migraine headaches, left occipital neuralgia, and longer standing significant depression/anxiety. Some headaches have occipital neuralgia type features. Symptoms seem to be worse with stress and external factors.   Meds tried: gabapentin, propranolol   Dx:  Migraine with aura and without status migrainosus, not intractable    PLAN:  MIGRAINE WITH AURA  - continue gabapentin 400mg  three times a day (10am, 5pm, 10pm) - continue tylenol as needed migraine - continue exercise, activity, nutrition strategies  Schizoaffective schizophrenia / anxiety / depression (per psychiatry) - continue seroquel 400mg  at bedtime - klonopin 0.5mg  twice a day   Meds ordered this encounter  Medications  . DISCONTD: gabapentin (NEURONTIN) 100 MG capsule    Sig: Take 2 capsules (200 mg total) by mouth 2 (two) times daily.    Dispense:  120 capsule    Refill:  12  . gabapentin (NEURONTIN) 400 MG capsule    Sig: Take 1 capsule (400 mg total) by mouth 3 (three) times daily.    Dispense:  90 capsule    Refill:  12   Return in about 1 year (around 03/20/2020) for with NP (Amy Lomax).     Penni Bombard, MD [id number], [id number] PM Certified in Neurology, Neurophysiology and Neuroimaging  [facility] [address], [place], [place] [address] [phone]

## 2019-03-21 NOTE — Patient Instructions (Signed)
MIGRAINE WITH AURA  - continue gabapentin 400mg  three times a day (10am, 5pm, 10pm)

## 2019-04-11 ENCOUNTER — Other Ambulatory Visit: Payer: Self-pay | Admitting: Family Medicine

## 2019-04-18 ENCOUNTER — Ambulatory Visit: Payer: Medicare Other | Admitting: Family Medicine

## 2019-05-05 ENCOUNTER — Other Ambulatory Visit: Payer: Self-pay | Admitting: Physician Assistant

## 2019-05-07 NOTE — Telephone Encounter (Signed)
Will defer further refills of patient's medications to PCP  

## 2019-05-21 ENCOUNTER — Telehealth: Payer: Self-pay | Admitting: *Deleted

## 2019-05-21 NOTE — Telephone Encounter (Signed)
Received from patient: It's been 3 weeks since I reduced the dosage to 800 mg of Gabapentin and I'm doing fine. Wanted to know if you think I could reduce a little more to see if it would help with my being sleepy during the day. Sent to Dr Leta Baptist for recommendations.

## 2019-05-22 NOTE — Telephone Encounter (Signed)
Ok to reduce gabapentin to 400mg  daily. -VRP

## 2019-05-22 NOTE — Telephone Encounter (Signed)
Sent Dr Gladstone Lighter reply via my chart.

## 2019-05-28 ENCOUNTER — Other Ambulatory Visit: Payer: Self-pay | Admitting: Family Medicine

## 2019-06-18 ENCOUNTER — Encounter: Payer: Self-pay | Admitting: Family Medicine

## 2019-06-26 ENCOUNTER — Encounter: Payer: Self-pay | Admitting: Family Medicine

## 2019-06-26 NOTE — Telephone Encounter (Signed)
Leaving for your review

## 2019-07-03 ENCOUNTER — Encounter: Payer: Self-pay | Admitting: Family Medicine

## 2019-07-16 ENCOUNTER — Telehealth: Payer: Medicare Other | Admitting: Family Medicine

## 2019-07-17 ENCOUNTER — Encounter: Payer: Self-pay | Admitting: Internal Medicine

## 2019-07-17 ENCOUNTER — Other Ambulatory Visit: Payer: Self-pay

## 2019-07-17 ENCOUNTER — Ambulatory Visit (INDEPENDENT_AMBULATORY_CARE_PROVIDER_SITE_OTHER): Payer: Medicare Other | Admitting: Internal Medicine

## 2019-07-17 VITALS — BP 122/60 | HR 82 | Temp 97.8°F | Ht 61.0 in | Wt 107.2 lb

## 2019-07-17 DIAGNOSIS — J4520 Mild intermittent asthma, uncomplicated: Secondary | ICD-10-CM

## 2019-07-17 NOTE — Progress Notes (Signed)
Name: Theresa Martin MRN: [id number] DOB: [birth year]        REFERRING MD : Birdie Riddle  CHIEF COMPLAINT Follow up asthma  HISTORY OF PRESENT ILLNESS: No exacerbation at this time No evidence of heart failure at this time No evidence or signs of infection at this time No respiratory distress No fevers, chills, nausea, vomiting, diarrhea No evidence of lower extremity edema No evidence hemoptysis  Using Qvar daily  Rinses out mouth Albuterol as needed  Triggers include smoke perfumes colognes  CT of the chest reviewed 10/31/2018 Right lower lobe vague opacification has significantly improved over the last 10 years No evidence of masses or significant nodules at this time Images reviewed with patient today      PAST MEDICAL HISTORY :   has a past medical history of Allergy, Anxiety, Depression, Hyperlipidemia, Hypertension, Osteopenia after menopause, Persistent headaches, Pneumonia, Postherpetic neuralgia, Renal disorder, Shingles outbreak (02/2014), and Skin cancer.  has a past surgical history that includes Cesarean section (1987, 1989); Exploratory laparotomy; Cardiovascular stress test (02/2009); Mole removal (2017); Eye surgery (Bilateral); and Colonoscopy. Prior to Admission medications   Medication Sig Start Date End Date Taking? Authorizing Provider  albuterol (VENTOLIN HFA) 108 (90 Base) MCG/ACT inhaler Inhale 2 puffs into the lungs every 6 (six) hours as needed for wheezing or shortness of breath. 08/10/18   [name], MD  atorvastatin (LIPITOR) 20 MG tablet TAKE 1 TABLET BY MOUTH EVERY DAY 04/10/18   [name], MD  beclomethasone (QVAR) 80 MCG/ACT inhaler Inhale 1-2 puffs into the lungs 2 (two) times daily. 09/27/18   Brunetta Jeans, PA-C  Calcium-Vitamin D-Vitamin K (CALCIUM + D + K PO) Take 3 each by mouth daily. Calcium 556m- Vitamin D 1000iu- K 463m    [provider]  clonazePAM (KLONOPIN) 0.5 MG tablet Take 0.25 mg by mouth at  bedtime as needed.  01/12/14   [provider]  ELDERBERRY PO Take by mouth.    [provider]  escitalopram (LEXAPRO) 10 MG tablet Take 10 mg by mouth daily.     [provider]  gabapentin (NEURONTIN) 100 MG capsule Take 1-3 capsules (100-300 mg total) by mouth 2 (two) times daily. Patient taking differently: Take 100-300 mg by mouth 2 (two) times daily. 100 in the morning and 200 at night. 01/30/18   Penumalli, ViEarlean PolkaMD  Glucosamine-Chondroitin (MOVE FREE PO) Take 1 tablet by mouth daily.    [provider]  Ketoconazole-Hydrocortisone 2 & 1 % KIT Apply topically as needed. Cracks on mouth    [provider]  lamoTRIgine (LAMICTAL) 100 MG tablet TAKE 10078mn the morning and 100 mg by mouth before bed.    [provider]  Melatonin 3 MG CAPS Take 1 capsule by mouth at bedtime.    [provider]  montelukast (SINGULAIR) 10 MG tablet Take 1 tablet (10 mg total) by mouth at bedtime. 09/27/18   MarBrunetta JeansA-C  Naproxen Sodium (ALEVE) 220 MG CAPS As needed for headache 04/15/15   Penumalli, VikEarlean PolkaD  NEOMYCIN-POLYMYXIN-HYDROCORTISONE (CORTISPORIN) 1 % SOLN OTIC solution Apply 1-2 drops to toe bid after soaking 04/03/18   Hyatt, Max T, DPM  traZODone (DESYREL) 50 MG tablet Take 50 mg by mouth at bedtime as needed for sleep.  05/29/15   [provider]  triamterene-hydrochlorothiazide (MAXZIDE-25) 37.5-25 MG tablet TAKE 1 TABLET BY MOUTH EVERY DAY 10/04/18   TabMidge MiniumD   No Known Allergies  FAMILY HISTORY:  family history includes Arrhythmia in her brother; Coronary artery disease in her mother; Heart failure in her mother; Hypertension in her brother, father, and mother; Liver disease in her sister. SOCIAL HISTORY:  reports that she has never smoked. She has never used smokeless tobacco. She reports that she does not drink alcohol or use drugs.      Review of Systems:  Gen:  Denies  fever, sweats,  chills weight loss  HEENT: Denies blurred vision, double vision, ear pain, eye pain, hearing loss, nose bleeds, sore throat Cardiac:  No dizziness, chest pain or heaviness, chest tightness,edema, No JVD Resp:   No cough, -sputum production, -shortness of breath,-wheezing, -hemoptysis,  Gi: Denies swallowing difficulty, stomach pain, nausea or vomiting, diarrhea, constipation, bowel incontinence Gu:  Denies bladder incontinence, burning urine Ext:   Denies Joint pain, stiffness or swelling Skin: Denies  skin rash, easy bruising or bleeding or hives Endoc:  Denies polyuria, polydipsia , polyphagia or weight change Psych:   Denies depression, insomnia or hallucinations  Other:  All other systems negative     BP 122/60 (BP Location: Left Arm, Cuff Size: Normal)   Pulse 82   Temp 97.8 F (36.6 C) (Temporal)   Ht 5' 1"  (1.549 m)   Wt 107 lb 3.2 oz (48.6 kg)   SpO2 98%   BMI 20.26 kg/m      Physical Examination:   General Appearance: No distress  Neuro:without focal findings,  speech normal,  HEENT: PERRLA, EOM intact.   Pulmonary: normal breath sounds, No wheezing.  CardiovascularNormal S1,S2.  No m/r/g.   Abdomen: Benign, Soft, non-tender. Renal:  No costovertebral tenderness  GU:  Not performed at this time. Endoc: No evident thyromegaly Skin:   warm, no rashes, no ecchymosis  Extremities: normal, no cyanosis, clubbing. PSYCHIATRIC: Mood, affect within normal limits.   ALL OTHER ROS ARE NEGATIVE    IMAGING   CT chest 10/2018  No acute process, 3 mm nodules No further CT scans needed   CT  was Independently Reviewed By Me Today       ASSESSMENT AND PLAN SYNOPSIS  Asthma mild intermittent well-controlled at this time Continues to take Qvar daily Albuterol as needed No signs of exacerbation at this time   Patient with previous history of right lower lobe pneumonia and history of lung nodules subcentimeter CT scan reviewed over the last 10 years No  further scans needed at this time   COVID-19 EDUCATION: The signs and symptoms of COVID-19 were discussed with the patient and how to seek care for testing.  The importance of social distancing was discussed today. Hand Washing Techniques and avoid touching face was advised.  MEDICATION ADJUSTMENTS/LABS AND TESTS ORDERED: Continue inhalers as prescribed    CURRENT MEDICATIONS REVIEWED AT LENGTH WITH PATIENT TODAY   Follow-up in 6 months  Total time spent 22 minutes  Shaurya Rawdon Patricia Pesa, M.D.  [facility]  Medical Director [place] Director [facility] Cardio-Pulmonary Department

## 2019-07-17 NOTE — Patient Instructions (Signed)
Continue inhalers as prescribed  Avoid triggers  No more CT scans needed at this time  Stop Singulair

## 2019-07-17 NOTE — Telephone Encounter (Signed)
Dr. Mortimer Fries email received today   "Hi when I came today I meant to ask Dr Mortimer Fries if I could try to cut down using my Qvar inhaler to once a day instead of two times.  Just wanted your feedback. Thanks"  Dr. Mortimer Fries please advise on Qvar

## 2019-07-20 ENCOUNTER — Other Ambulatory Visit: Payer: Self-pay

## 2019-07-20 ENCOUNTER — Encounter: Payer: Self-pay | Admitting: Family Medicine

## 2019-07-20 ENCOUNTER — Telehealth (INDEPENDENT_AMBULATORY_CARE_PROVIDER_SITE_OTHER): Payer: Medicare Other | Admitting: Family Medicine

## 2019-07-20 DIAGNOSIS — F331 Major depressive disorder, recurrent, moderate: Secondary | ICD-10-CM

## 2019-07-20 DIAGNOSIS — R5383 Other fatigue: Secondary | ICD-10-CM | POA: Diagnosis not present

## 2019-07-20 NOTE — Progress Notes (Signed)
I have discussed the procedure for the virtual visit with the patient who has given consent to proceed with assessment and treatment.   Theresa Martin, CMA     

## 2019-07-20 NOTE — Progress Notes (Signed)
Virtual Visit via Video   I connected with patient on 07/20/19 at  2:30 PM EDT by a video enabled telemedicine application and verified that I am speaking with the correct person using two identifiers.  Location patient: Home Location provider: Acupuncturist, Office Persons participating in the virtual visit: Patient, Provider, Nathalie [name]  I discussed the limitations of evaluation and management by telemedicine and the availability of in person appointments. The patient expressed understanding and agreed to proceed.  Subjective:   HPI:   'my psychiatrist just wanted me to check some blood panels'- Pt reports that she has been having 'a lot of depression'.  Was previously doing well on Seroquel alone but recently she has had to restart Lexapro.  + fatigue.  'I just don't feel good'.  Pt reports feeling somewhat better since stopping Singulair.  Yesterday she was able to go the gym for the first time in 'a long time'.  ROS:   See pertinent positives and negatives per HPI.  Patient Active Problem List   Diagnosis Date Noted  . GERD (gastroesophageal reflux disease) 02/28/2019  . Schizo affective schizophrenia ([place]) 02/01/2019  . Abnormal chest CT 12/11/2018  . Left sided numbness 10/22/2018  . Nonallopathic lesion of thoracic region 09/05/2018  . Nonallopathic lesion of rib cage 09/05/2018  . Nonallopathic lesion of cervical region 09/05/2018  . Nonallopathic lesion of lumbosacral region 09/05/2018  . Nonallopathic lesion of sacral region 09/05/2018  . Skin cancer   . Osteopenia after menopause   . Chronic right shoulder pain 03/30/2017  . Right shoulder pain 01/12/2017  . Tendinopathy of rotator cuff 12/20/2016  . Angle-closure glaucoma, severe stage 08/19/2015  . Migraine without aura and without status migrainosus, not intractable 01/13/2015  . High risk medications (not anticoagulants) long-term use 09/26/2014  . Routine general medical examination at a health  care facility 07/27/2012  . Hyperlipidemia 09/29/2010  . URETEROPELVIC JUNCTION OBSTRUCTION, CONGENITAL 12/15/2009  . CONSTIPATION 12/11/2009  . CARDIAC MURMUR 11/26/2008  . MAGNETIC RESONANCE IMAGING, BRAIN, ABNORMAL 10/29/2008  . VENEREAL WART 10/23/2008  . Major depressive disorder, recurrent episode ([place]) 10/23/2008  . Essential hypertension 10/23/2008  . ALLERGIC RHINITIS 10/23/2008  . Asthma 10/23/2008  . RENAL CALCULUS, HX OF 10/23/2008    Social History   Tobacco Use  . Smoking status: Never Smoker  . Smokeless tobacco: Never Used  Substance Use Topics  . Alcohol use: No    Alcohol/week: 0.0 standard drinks    Current Outpatient Medications:  .  albuterol (PROVENTIL HFA) 108 (90 Base) MCG/ACT inhaler, Inhale 2 puffs into the lungs every 6 (six) hours as needed for wheezing or shortness of breath., Disp: 6.7 g, Rfl: 1 .  atorvastatin (LIPITOR) 20 MG tablet, TAKE 1 TABLET BY MOUTH EVERY DAY, Disp: 90 tablet, Rfl: 2 .  beclomethasone (QVAR) 80 MCG/ACT inhaler, Inhale 1-2 puffs into the lungs 2 (two) times daily., Disp: 10.6 g, Rfl: 1 .  Calcium-Vitamin D-Vitamin K (CALCIUM + D + K PO), Take 3 each by mouth daily. Calcium 500mg - Vitamin D 1000iu- K 40mg  , Disp: , Rfl:  .  escitalopram (LEXAPRO) 10 MG tablet, Take 10 mg by mouth daily., Disp: , Rfl:  .  gabapentin (NEURONTIN) 400 MG capsule, Take 1 capsule (400 mg total) by mouth 3 (three) times daily., Disp: 90 capsule, Rfl: 12 .  montelukast (SINGULAIR) 10 MG tablet, TAKE 1 TABLET BY MOUTH EVERYDAY AT BEDTIME, Disp: 30 tablet, Rfl: 3 .  QUEtiapine (SEROQUEL XR) 400 MG  24 hr tablet, Take 1 tablet (400 mg total) by mouth at bedtime., Disp: 30 tablet, Rfl: 1 .  temazepam (RESTORIL) 15 MG capsule, Take 1 capsule (15 mg total) by mouth at bedtime., Disp: 30 capsule, Rfl: 0 .  triamterene-hydrochlorothiazide (MAXZIDE-25) 37.5-25 MG tablet, TAKE 1 TABLET BY MOUTH EVERY DAY, Disp: 90 tablet, Rfl: 2  No Known Allergies  Objective:    There were no vitals taken for this visit. AAOx3, NAD NCAT, EOMI No obvious CN deficits Coloring WNL Pt is able to speak clearly, coherently without shortness of breath or increased work of breathing.  Thought process is linear.  Mood is appropriate.   Assessment and Plan:   Depression- ongoing issue for pt.  Deteriorated recently.  Pt is following w/ psych and recently restarted Lexapro.  Her mood could be the cause of her excessive fatigue but psych wants to r/o possible metabolic causes.  Fatigue- new.  likely multifactorial (depression, medication, etc).  Will check labs to r/o metabolic causes.  She feels somewhat better after stopping Singulair.  Encouraged her to resume her regular exercise.  Will follow.   Annye Asa, MD 07/20/2019

## 2019-07-27 ENCOUNTER — Telehealth: Payer: Self-pay | Admitting: Family Medicine

## 2019-07-27 ENCOUNTER — Other Ambulatory Visit (INDEPENDENT_AMBULATORY_CARE_PROVIDER_SITE_OTHER): Payer: Medicare Other

## 2019-07-27 ENCOUNTER — Other Ambulatory Visit: Payer: Medicare Other

## 2019-07-27 ENCOUNTER — Other Ambulatory Visit: Payer: Self-pay

## 2019-07-27 DIAGNOSIS — R5383 Other fatigue: Secondary | ICD-10-CM

## 2019-07-27 LAB — CBC WITH DIFFERENTIAL/PLATELET
Basophils Absolute: 0 10*3/uL (ref 0.0–0.1)
Basophils Relative: 0.3 % (ref 0.0–3.0)
Eosinophils Absolute: 0 10*3/uL (ref 0.0–0.7)
Eosinophils Relative: 0.4 % (ref 0.0–5.0)
HCT: 37 % (ref 36.0–46.0)
Hemoglobin: 12.3 g/dL (ref 12.0–15.0)
Lymphocytes Relative: 11.3 % — ABNORMAL LOW (ref 12.0–46.0)
Lymphs Abs: 1.2 10*3/uL (ref 0.7–4.0)
MCHC: 33.3 g/dL (ref 30.0–36.0)
MCV: 91.6 fl (ref 78.0–100.0)
Monocytes Absolute: 0.4 10*3/uL (ref 0.1–1.0)
Monocytes Relative: 4.1 % (ref 3.0–12.0)
Neutro Abs: 8.9 10*3/uL — ABNORMAL HIGH (ref 1.4–7.7)
Neutrophils Relative %: 83.9 % — ABNORMAL HIGH (ref 43.0–77.0)
Platelets: 262 10*3/uL (ref 150.0–400.0)
RBC: 4.04 Mil/uL (ref 3.87–5.11)
RDW: 13.2 % (ref 11.5–15.5)
WBC: 10.6 10*3/uL — ABNORMAL HIGH (ref 4.0–10.5)

## 2019-07-27 LAB — BASIC METABOLIC PANEL
BUN: 13 mg/dL (ref 6–23)
CO2: 31 mEq/L (ref 19–32)
Calcium: 9 mg/dL (ref 8.4–10.5)
Chloride: 102 mEq/L (ref 96–112)
Creatinine, Ser: 0.72 mg/dL (ref 0.40–1.20)
GFR: 81.41 mL/min (ref >60.00)
Glucose, Bld: 112 mg/dL — ABNORMAL HIGH (ref 70–99)
Potassium: 3.5 mEq/L (ref 3.5–5.1)
Sodium: 140 mEq/L (ref 135–145)

## 2019-07-27 LAB — HEPATIC FUNCTION PANEL
ALT: 22 U/L (ref 0–35)
AST: 22 U/L (ref 0–37)
Albumin: 3.9 g/dL (ref 3.5–5.2)
Alkaline Phosphatase: 70 U/L (ref 39–117)
Bilirubin, Direct: 0.1 mg/dL (ref 0.0–0.3)
Total Bilirubin: 0.4 mg/dL (ref 0.2–1.2)
Total Protein: 6.7 g/dL (ref 6.0–8.3)

## 2019-07-27 LAB — TSH: TSH: 1.27 u[IU]/mL (ref 0.35–4.50)

## 2019-07-27 NOTE — Telephone Encounter (Signed)
Pt was not fasting for her BW today. She wanted to make Tabori aware of this.

## 2019-07-27 NOTE — Telephone Encounter (Signed)
FYI

## 2019-08-02 ENCOUNTER — Other Ambulatory Visit: Payer: Self-pay | Admitting: Family Medicine

## 2019-08-06 ENCOUNTER — Telehealth: Payer: Self-pay | Admitting: Family Medicine

## 2019-08-06 NOTE — Telephone Encounter (Signed)
Pt called in with questions about the neutrophils and lymphocytes Relative as well as the Neutro Abs. She wanted to know what they are and if she should be concerned about the range of the results

## 2019-08-06 NOTE — Telephone Encounter (Signed)
Please advise 

## 2019-08-07 ENCOUNTER — Telehealth: Payer: Self-pay | Admitting: *Deleted

## 2019-08-07 NOTE — Telephone Encounter (Signed)
Ok to setup follow up with me. -VRP

## 2019-08-07 NOTE — Telephone Encounter (Signed)
Spoke with patient and scheduled her to be seen tomorrow. She understands to arrive 30 minutes early, bring insurance cards, med list, wear a mask. Patient verbalized understanding, appreciation.

## 2019-08-07 NOTE — Telephone Encounter (Signed)
Received via my chart: I am having issues with my lower left eye pulling to the outside and some eye pain also. I went to my eye dr a week ago and was prescribed eyedrops for glaucoma because he was unsure why I was having this. I haven't filled them yet and now I'm having the pulling in my upper right eyelid also, so I called back and his nurse suggested that I contact you because she thinks this could be a neurological issue. So far I'm only having pain in my left eye.  Will route to MD; she is seen for migraines.

## 2019-08-08 ENCOUNTER — Other Ambulatory Visit: Payer: Self-pay

## 2019-08-08 ENCOUNTER — Ambulatory Visit (INDEPENDENT_AMBULATORY_CARE_PROVIDER_SITE_OTHER): Payer: Medicare Other | Admitting: Diagnostic Neuroimaging

## 2019-08-08 ENCOUNTER — Encounter: Payer: Self-pay | Admitting: Diagnostic Neuroimaging

## 2019-08-08 VITALS — BP 112/65 | HR 82 | Temp 96.9°F | Ht 61.0 in | Wt 116.0 lb

## 2019-08-08 DIAGNOSIS — H5712 Ocular pain, left eye: Secondary | ICD-10-CM

## 2019-08-08 MED ORDER — GABAPENTIN 400 MG PO CAPS: 400.0000 mg | ORAL_CAPSULE | Freq: Two times a day (BID) | ORAL | 12 refills | Status: DC

## 2019-08-08 NOTE — Progress Notes (Signed)
[facility]  PATIENT: Theresa Martin DOB: November 03, 1955  REFERRING CLINICIAN:  HISTORY FROM: patient REASON FOR VISIT: follow up   HISTORICAL  CHIEF COMPLAINT:  Chief Complaint  Patient presents with  . Issue with eyes    rm 7, est patient with new problem "feels like my lower left eyelid is pulling and my eye hurts; now it's happening in my right eye; I saw eye dr 1 week ago"    HISTORY OF PRESENT ILLNESS:   UPDATE (08/08/19, VRP): Since last visit, doing well, except past 3 months, has left eye pulling sensation, left eye pain, then left head pain. Some sensations in right eye. Also with mild glaucoma.   UPDATE (03/21/19, VRP): Since last visit, doing well until COVID pandemic. She weaned herself off her psych meds, then mania, psychosis . Then headaches worsened. Admitted to behavioral healthNow psych issues are better, and headache resolved since last month.   UPDATE (01/30/18, VRP): Since last visit, doing worse with HA (14-15 per month). More stress levels than last visit. Burning sensation in scalp continues.  UPDATE (08/24/17, VRP): Since last visit, doing well. Tolerating propranolol. Avg 4-13 migraine per month. No alleviating or aggravating factors.   Separately, had right thoracic burning pain (no rash) in fall 2018, and was tx'd empirically with acyclovir. Then with right scalp burning pain last week; also with chronic left scalp sensitivity with migraines.  UPDATE 08/24/16: Since last visit, avg 2-3 HA per month. Tolerating meds. No new issues. Mood stable. Weight improved.   UPDATE 02/23/16: Since last visit has ~ 1-5 HA per month. Tolerating propranolol + OTC tylenol/aleve for HA mgmt.   UPDATE 08/19/15: Since last visit, avg 1-10 HA per month. Usually with stress. Some more wt gain noted (~10lbs). Also had a a laser eye procedure for angle closure glaucoma, now improved.  UPDATE 04/15/15: Since last visit, doing well. No more migraine since  02/28/15. Propranolol is helping prevent HA. Mood stable.  UPDATE 01/13/15: Since last visit, having more headaches, and more fluid retention. Now being tapered off gabapentin by psychiatry due to side effects.   UPDATE 09/02/14: Doing well. Avg 3-6 days HA per month. Some HA are mild, some are severe. Taking gabapentin 200mg  TID. Works out every day at [employer]. Overall mood is better as well.  UPDATE 05/17/14: Since last visit, patient had some psychiatry issues, was managed by behavioral health, and her headaches significant improved. She was doing well for several years and did not follow-up in our clinic. Patient here with her father today for this visit. Patient's father notes that her headaches seem to be significantly associated with her stress levels. In the last few months her stress levels have increased significantly. Her stress levels related to her ex-husband and her daughters, and some family issues. Patient having intermittent, almost daily left-sided headaches, with numbness and tingling in the left scalp, typically in the evening when she is home. During the daytime patient stays active, works out several times a week, and does better. Patient had been on gabapentin 100 mg at bedtime for several years. Her psychiatrist increased this to 2 and then 3 capsules at bedtime. When she did 3 capsules at bedtime, her headache type symptoms paradoxical he worsened. She then went back to taking one capsule at bedtime. Strangely, she reports that she was told she was able to take this medication (gabapentin) as needed as well and for the last few weeks has been taking one capsule at bedtime,  followed by 1-2 capsules every hour, throughout the night, taking up to 10-14 capsules in a 12 hour period.   PRIOR HPI (11/04/08 - 06/03/09, VRP): 64 year old right-handed female with history of high blood pressure, depression and anxiety presenting for evaluation of chronic headaches and abnormal MRI scan.  On October 26, 2008 the patient presented to the emergency room for right-sided chest pain and was diagnosed with pneumonia.   At this time her headaches worsened in severity.  Upon review of prior MRIs demonstrating microvascular gliosis, patient was referred to our neurology clinic for futher evaluation. Patient's headaches began in 2005 consisting of a "dull pressure like sensation" over the top of her head. Occasionally these are associated with mild nausea without vomiting as well as intermittent left facial numbness.  Patient had dull nagging low level headaches on a daily basis with daily flareups involving a hot sensation over her scalp.  Sometimes this sensation starts as posterior neck pain that moves up the back of her head. She was initially evaluated with MRI scans in 2005 and 2006 and started on topiramate 100 mg at bedtime.   In addition she was taking Advil and Tylenol over-the-counter almost a daily basis.   During flareups the patient would have to lay down in place ice packs over her eyes and the top of her head.   Patient's headaches are aggravated by lack of sleep or stress. No food triggers noted.  Her other physicians decided to taper her off of NSAIDs, tylenol and also her topiramate. Patient had MRI of the head and neck, found to have some nonspecific white matter lesions. Patient had lumbar puncture and additional blood testing, but multiple sclerosis was ruled out. Patient was treated with several occipital nerve blocks with good results.   REVIEW OF SYSTEMS: Full 14 system review of systems performed and negative except for: as per HPI.   ALLERGIES: No Known Allergies  HOME MEDICATIONS: Outpatient Medications Prior to Visit  Medication Sig Dispense Refill  . albuterol (PROVENTIL HFA) 108 (90 Base) MCG/ACT inhaler Inhale 2 puffs into the lungs every 6 (six) hours as needed for wheezing or shortness of breath. 6.7 g 1  . atorvastatin (LIPITOR) 20 MG tablet TAKE 1 TABLET BY MOUTH EVERY DAY 90  tablet 2  . beclomethasone (QVAR) 80 MCG/ACT inhaler Inhale 1-2 puffs into the lungs 2 (two) times daily. 10.6 g 1  . Calcium-Vitamin D-Vitamin K (CALCIUM + D + K PO) Take 3 each by mouth daily. Calcium 500mg - Vitamin D 1000iu- K 40mg      . escitalopram (LEXAPRO) 10 MG tablet Take 10 mg by mouth daily.    Marland Kitchen gabapentin (NEURONTIN) 400 MG capsule Take 1 capsule (400 mg total) by mouth 3 (three) times daily. 90 capsule 12  . QUEtiapine (SEROQUEL XR) 400 MG 24 hr tablet Take 1 tablet (400 mg total) by mouth at bedtime. 30 tablet 1  . temazepam (RESTORIL) 15 MG capsule Take 1 capsule (15 mg total) by mouth at bedtime. 30 capsule 0  . triamterene-hydrochlorothiazide (MAXZIDE-25) 37.5-25 MG tablet TAKE 1 TABLET BY MOUTH EVERY DAY 90 tablet 2  . montelukast (SINGULAIR) 10 MG tablet TAKE 1 TABLET BY MOUTH EVERYDAY AT BEDTIME 30 tablet 3   No facility-administered medications prior to visit.    PAST MEDICAL HISTORY: Past Medical History:  Diagnosis Date  . Allergy   . Anxiety   . Depression   . Hyperlipidemia   . Hypertension   . Osteopenia after menopause   .  Persistent headaches   . Pneumonia 2020  . Postherpetic neuralgia   . Renal disorder   . Shingles outbreak 02/2014  . Skin cancer    moles on right groin and right buttock.    PAST SURGICAL HISTORY: Past Surgical History:  Procedure Laterality Date  . CARDIOVASCULAR STRESS TEST  02/2009   treadmill stress test: Low risk  . [place]  . COLONOSCOPY     2014/2015 [place], normal  . EXPLORATORY LAPAROTOMY     laproscopy for infertility  . EYE SURGERY Bilateral    laser-correct opening b/tn cornea and iris  . MOLE REMOVAL  2017   x 2 moles    FAMILY HISTORY: Family History  Problem Relation Age of Onset  . Hypertension Mother   . Coronary artery disease Mother   . Heart failure Mother   . Hypertension Father   . Liver disease Sister        liver failure  . Arrhythmia Brother   . Hypertension  Brother   . Cancer Neg Hx        colon   . Breast cancer Neg Hx     SOCIAL HISTORY:  Social History   Socioeconomic History  . Marital status: Legally Separated    Spouse name: Not on file  . Number of children: 2  . Years of education: Not on file  . Highest education level: Not on file  Occupational History  . Occupation: Control and instrumentation engineer  Tobacco Use  . Smoking status: Never Smoker  . Smokeless tobacco: Never Used  Substance and Sexual Activity  . Alcohol use: No    Alcohol/week: 0.0 standard drinks  . Drug use: No  . Sexual activity: Not on file  Other Topics Concern  . Not on file  Social History Narrative   Regular exercise- yes, spins daily   Diet: fruits and veggies, water    No caffeine   Lives alone    Social Determinants of Health   Financial Resource Strain:   . Difficulty of Paying Living Expenses:   Food Insecurity:   . Worried About Charity fundraiser in the Last Year:   . Arboriculturist in the Last Year:   Transportation Needs:   . Film/video editor (Medical):   Marland Kitchen Lack of Transportation (Non-Medical):   Physical Activity:   . Days of Exercise per Week:   . Minutes of Exercise per Session:   Stress:   . Feeling of Stress :   Social Connections:   . Frequency of Communication with Friends and Family:   . Frequency of Social Gatherings with Friends and Family:   . Attends Religious Services:   . Active Member of Clubs or Organizations:   . Attends Archivist Meetings:   Marland Kitchen Marital Status:   Intimate Partner Violence:   . Fear of Current or Ex-Partner:   . Emotionally Abused:   Marland Kitchen Physically Abused:   . Sexually Abused:      PHYSICAL EXAM  Vitals:   08/08/19 1257  BP: 112/65  Pulse: 82  Temp: (!) 96.9 F (36.1 C)  Weight: 116 lb (52.6 kg)  Height: 5\' 1"  (1.549 m)   Wt Readings from Last 3 Encounters:  08/08/19 116 lb (52.6 kg)  07/17/19 107 lb 3.2 oz (48.6 kg)  03/21/19 114 lb (51.7 kg)   Body mass index is  21.92 kg/m.  No exam data present  MMSE - Mini Mental State Exam 06/03/2016  Orientation to time 5  Orientation to Place 5  Registration 3  Attention/ Calculation 5  Recall 3  Language- name 2 objects 2  Language- repeat 1  Language- follow 3 step command 3  Language- read & follow direction 1  Write a sentence 1  Copy design 1  Total score 30    GENERAL EXAM: Patient is in no distress; well developed, nourished and groomed; neck is supple  CARDIOVASCULAR: Regular rate and rhythm, no murmurs, no carotid bruits; BRADYCARDIA  NEUROLOGIC: MENTAL STATUS: awake, alert, language fluent, comprehension intact, naming intact, fund of knowledge appropriate CRANIAL NERVE: pupils equal and reactive to light, visual fields full to confrontation, extraocular muscles intact, no nystagmus, facial sensation and strength symmetric, hearing intact, palate elevates symmetrically, uvula midline, shoulder shrug symmetric, tongue midline. MOTOR: normal bulk and tone, full strength in the BUE, BLE SENSORY: normal and symmetric to light touch, temperature, vibration COORDINATION: finger-nose-finger, fine finger movements normal REFLEXES: deep tendon reflexes present and symmetric GAIT/STATION: narrow based gait; romberg is negative    DIAGNOSTIC DATA (LABS, IMAGING, TESTING) - I reviewed patient records, labs, notes, testing and imaging myself where available.  Lab Results  Component Value Date   WBC 10.6 (H) 07/27/2019   HGB 12.3 07/27/2019   HCT 37.0 07/27/2019   MCV 91.6 07/27/2019   PLT 262.0 07/27/2019      Component Value Date/Time   NA 140 07/27/2019 0953   K 3.5 07/27/2019 0953   CL 102 07/27/2019 0953   CO2 31 07/27/2019 0953   GLUCOSE 112 (H) 07/27/2019 0953   BUN 13 07/27/2019 0953   CREATININE 0.72 07/27/2019 0953   CREATININE 0.90 06/08/2017 1534   CALCIUM 9.0 07/27/2019 0953   PROT 6.7 07/27/2019 0953   ALBUMIN 3.9 07/27/2019 0953   AST 22 07/27/2019 0953   ALT 22  07/27/2019 0953   ALKPHOS 70 07/27/2019 0953   BILITOT 0.4 07/27/2019 0953   GFRNONAA >60 02/11/2019 0702   GFRAA >60 02/11/2019 0702   Lab Results  Component Value Date   CHOL 158 02/02/2019   HDL 62 02/02/2019   LDLCALC 86 02/02/2019   TRIG 49 02/02/2019   CHOLHDL 2.5 02/02/2019   Lab Results  Component Value Date   HGBA1C 5.3 09/30/2018   Lab Results  Component Value Date   [id number] 01/16/2010   Lab Results  Component Value Date   TSH 1.27 07/27/2019    11/13/08 LP - opening pressure 7cm H2O; WBC 1, RBC 0, glucose 62, protein 26, OCB (2 is CSF, not seen in serum), IgG index 0.5 (normal), lyme PCR neg, EBV PCR neg  11/13/08 VEP - normal  11/08/08 MRI cervical - normal  11/08/08 MRI brain (with and without contrast) - multiple supratentorial, periventricular and juxtacortical white matter lesions which may represent chronic demyelinating plaques or perivascular gliosis.  No abnormal enhancement on postcontrast views.   08/28/17 MRI brain  1.  Scattered T2/FLAIR hyperintense foci predominantly in the deep and subcortical white matter.  This is a nonspecific finding and most likely represents chronic microvascular ischemic change.  The pattern is not typical for demyelination.  The foci are not acute and they do not enhance after contrast.  When compared to the MRI dated 05/20/2004, there has been only slight progression in the number or size of the foci. 2.  There is a normal enhancement pattern and there are no acute findings.   09/30/18 MRI brain [I reviewed images myself and agree with interpretation. -VRP]  -  No acute or subacute infarction. - Numerous scattered foci of T2 and FLAIR signal within the cerebral hemispheric deep and subcortical white matter. The differential diagnosis on the basis of the imaging would be demyelinating disease versus small-vessel disease. Findings appear stable since June of last year but are progressive since 2006. Given the history of  hypertension and hyperlipidemia, small-vessel disease is probably more likely    ASSESSMENT AND PLAN  64 y.o. year old female here with mixed tension and migraine headaches, left occipital neuralgia, and longer standing significant depression/anxiety. Some headaches have occipital neuralgia type features. Symptoms seem to be worse with stress and external factors.   Meds tried: gabapentin, propranolol  Dx:  Left eye pain - Plan: MR BRAIN W WO CONTRAST    PLAN:  LEFT EYE PAIN / SPASM / LEFT HEADACHES - check MRI brain  MIGRAINE WITH AURA (history of angle closure glaucoma; cannot use topiramate) - continue gabapentin 400mg  at bedtime; may increase to twice a day - continue tylenol as needed migraine - continue exercise, activity, nutrition strategies  Schizoaffective schizophrenia / anxiety / depression (per psychiatry) - continue seroquel 400mg  at bedtime  Meds ordered this encounter  Medications  . gabapentin (NEURONTIN) 400 MG capsule    Sig: Take 1 capsule (400 mg total) by mouth 2 (two) times daily.    Dispense:  60 capsule    Refill:  12   Meds ordered this encounter  Medications  . gabapentin (NEURONTIN) 400 MG capsule    Sig: Take 1 capsule (400 mg total) by mouth 2 (two) times daily.    Dispense:  60 capsule    Refill:  12   Return in about 1 year (around 08/07/2020).     Penni Bombard, MD [id number], [id number] PM Certified in Neurology, Neurophysiology and Neuroimaging  [facility] [address], [place], [place] [address] [phone]

## 2019-08-08 NOTE — Patient Instructions (Signed)
-   check MRI brain  - increase gabapentin to 400mg  twice a day

## 2019-08-09 NOTE — Telephone Encounter (Signed)
Neutrophils and lymphocytes are subsets of white blood cells.  If one goes up, the other goes down.  I reviewed this again and there is no cause for concern

## 2019-08-09 NOTE — Telephone Encounter (Signed)
Called pt and LMOVM to inform of PCP advice.

## 2019-08-10 ENCOUNTER — Telehealth: Payer: Self-pay | Admitting: Family Medicine

## 2019-08-10 ENCOUNTER — Other Ambulatory Visit: Payer: Self-pay

## 2019-08-10 ENCOUNTER — Telehealth (INDEPENDENT_AMBULATORY_CARE_PROVIDER_SITE_OTHER): Payer: Medicare Other | Admitting: Family Medicine

## 2019-08-10 ENCOUNTER — Encounter: Payer: Self-pay | Admitting: Family Medicine

## 2019-08-10 DIAGNOSIS — Z20822 Contact with and (suspected) exposure to covid-19: Secondary | ICD-10-CM

## 2019-08-10 NOTE — Telephone Encounter (Signed)
At this time, we are still seeing COVID cases locally and with her respiratory symptoms she needs to be seen virtually.  Once we have a virtual visit, we can determine what is needed going forward

## 2019-08-10 NOTE — Progress Notes (Signed)
Virtual Visit via Video   I connected with patient on 08/10/19 at  3:30 PM EDT by a video enabled telemedicine application and verified that I am speaking with the correct person using two identifiers.  Location patient: Home Location provider: Acup[facility]urist, Office Persons participating in the virtual visit: Patient, Provider, [place] [name]  I discussed the limitations of evaluation and management by telemedicine and the availability of in person appointments. The patient expressed understanding and agreed to proceed.  Subjective:   HPI:   URI- 'I feel like I've been hit by a truck'.  Sxs started Wednesday w/ a sore throat.  Then developed body aches, chest congestion, HA.  Minimal cough.  Tm 100 earlier today.  No changes to taste or smell.  'my ears hurt, my chest hurts'.  Denies sinus pain/pressure- 'it's not a sinus headache'.  No known sick contacts.  Has been wearing a mask when out.  'my chest feels tight'.  ROS:   See pertinent positives and negatives per HPI.  Patient Active Problem List   Diagnosis Date Noted  . GERD (gastroesophageal reflux disease) 02/28/2019  . Schizo affective schizophrenia ([place]) 02/01/2019  . Abnormal chest CT 12/11/2018  . Left sided numbness 10/22/2018  . Nonallopathic lesion of thoracic region 09/05/2018  . Nonallopathic lesion of rib cage 09/05/2018  . Nonallopathic lesion of cervical region 09/05/2018  . Nonallopathic lesion of lumbosacral region 09/05/2018  . Nonallopathic lesion of sacral region 09/05/2018  . Skin cancer   . Osteopenia after menopause   . Chronic right shoulder pain 03/30/2017  . Right shoulder pain 01/12/2017  . Tendinopathy of rotator cuff 12/20/2016  . Angle-closure glaucoma, severe stage 08/19/2015  . Migraine without aura and without status migrainosus, not intractable 01/13/2015  . High risk medications (not anticoagulants) long-term use 09/26/2014  . Routine general medical examination at a health care  facility 07/27/2012  . Hyperlipidemia 09/29/2010  . URETEROPELVIC JUNCTION OBSTRUCTION, CONGENITAL 12/15/2009  . CONSTIPATION 12/11/2009  . CARDIAC MURMUR 11/26/2008  . MAGNETIC RESONANCE IMAGING, BRAIN, ABNORMAL 10/29/2008  . VENEREAL WART 10/23/2008  . Major depressive disorder, recurrent episode ([place]) 10/23/2008  . Essential hypertension 10/23/2008  . ALLERGIC RHINITIS 10/23/2008  . Asthma 10/23/2008  . RENAL CALCULUS, HX OF 10/23/2008    Social History   Tobacco Use  . Smoking status: Never Smoker  . Smokeless tobacco: Never Used  Substance Use Topics  . Alcohol use: No    Alcohol/week: 0.0 standard drinks    Current Outpatient Medications:  .  albuterol (PROVENTIL HFA) 108 (90 Base) MCG/ACT inhaler, Inhale 2 puffs into the lungs every 6 (six) hours as needed for wheezing or shortness of breath., Disp: 6.7 g, Rfl: 1 .  atorvastatin (LIPITOR) 20 MG tablet, TAKE 1 TABLET BY MOUTH EVERY DAY, Disp: 90 tablet, Rfl: 2 .  beclomethasone (QVAR) 80 MCG/ACT inhaler, Inhale 1-2 puffs into the lungs 2 (two) times daily., Disp: 10.6 g, Rfl: 1 .  Calcium-Vitamin D-Vitamin K (CALCIUM + D + K PO), Take 3 each by mouth daily. Calcium 500mg - Vitamin D 1000iu- K 40mg  , Disp: , Rfl:  .  escitalopram (LEXAPRO) 10 MG tablet, Take 10 mg by mouth daily., Disp: , Rfl:  .  gabapentin (NEURONTIN) 400 MG capsule, Take 1 capsule (400 mg total) by mouth 2 (two) times daily., Disp: 60 capsule, Rfl: 12 .  QUEtiapine (SEROQUEL XR) 400 MG 24 hr tablet, Take 1 tablet (400 mg total) by mouth at bedtime., Disp: 30 tablet, Rfl:  1 .  temazepam (RESTORIL) 15 MG capsule, Take 1 capsule (15 mg total) by mouth at bedtime., Disp: 30 capsule, Rfl: 0 .  triamterene-hydrochlorothiazide (MAXZIDE-25) 37.5-25 MG tablet, TAKE 1 TABLET BY MOUTH EVERY DAY, Disp: 90 tablet, Rfl: 2  No Known Allergies  Objective:   There were no vitals taken for this visit. AAOx3, NAD NCAT, EOMI No obvious CN deficits Coloring WNL Pt is  able to speak clearly, coherently without shortness of breath or increased work of breathing.  Thought process is linear.  Mood is appropriate.   Assessment and Plan:   Viral URI/possible COVID- new.  Reviewed that she doesn't currently have signs/sxs of bacterial illness- no sore throat to suggest strep, no cough to suggest PNA, no sinus pressure to suggest sinusitis.  Sxs are consistent w/ viral illness.  Encouraged her to get a COVID test based on how she is feeling.  Reviewed supportive care- fluids, rest, tylenol/ibuprofen, Vit D, Vit C, Zinc.  She has an inhaler available for use.  Encouraged her to add Mucinex.  Discussed red flags that should prompt immediate attention- shortness of breath, CP.  Pt expressed understanding and is in agreement w/ plan.   Annye Asa, MD 08/10/2019

## 2019-08-10 NOTE — Telephone Encounter (Signed)
Pt called in stating that she would rather do an in office visit with Dr. Birdie Riddle due to her history of pneumonia. She states that he symptoms started on Wednesday with a sore throat, she now has congestion, and a cough. I did let her know that we need to start with a mychart visit and she did schedule one. She would rather be seen in person. Please advise if it is ok for an in office or reccommend what pt should do if we can't do the appt. In office.

## 2019-08-10 NOTE — Telephone Encounter (Signed)
Please advise 

## 2019-08-10 NOTE — Telephone Encounter (Signed)
Called and advised pt of PCP recommendations. She advised that she is now running a fever of 100 and with a cough.  She is going to call and see if she can get a COVID test done before appt with PCP since symptoms are progressing.

## 2019-08-10 NOTE — Progress Notes (Signed)
I have discussed the procedure for the virtual visit with the patient who has given consent to proceed with assessment and treatment.   Pt unable to obtain vitals.   Jessica L Brodmerkel, CMA     

## 2019-08-10 NOTE — Telephone Encounter (Signed)
Yes.  She definitely needs a COVID test.

## 2019-08-14 ENCOUNTER — Telehealth: Payer: Self-pay | Admitting: Diagnostic Neuroimaging

## 2019-08-14 NOTE — Telephone Encounter (Signed)
Medicare/bcbs Auth: [id number] (exp. 08/14/19 to 02/09/20) order sent to GI. They will reach out to the patient to schedule.

## 2019-08-16 ENCOUNTER — Telehealth: Payer: Self-pay

## 2019-08-16 MED ORDER — AMOXICILLIN 875 MG PO TABS
875.0000 mg | ORAL_TABLET | Freq: Two times a day (BID) | ORAL | 0 refills | Status: DC
Start: 2019-08-16 — End: 2019-09-05

## 2019-08-16 NOTE — Telephone Encounter (Signed)
Patient called in stating she is still having congestion in her head and chest as well as headache. No complaints of fever or body aches. Patient was seen virtually on 08/10/19. She states she has been taking Mucinex. In the morning her phlegm is green and as the day goes on it is mostly clear. She would like to have an antibiotic called into her pharmacy.

## 2019-08-16 NOTE — Telephone Encounter (Signed)
Prescription sent for Amoxicillin.  Pt can pick up and start at her convenience.

## 2019-08-16 NOTE — Telephone Encounter (Signed)
Patient notified of PCP recommendations and is agreement and expresses an understanding.  

## 2019-08-20 ENCOUNTER — Telehealth: Payer: Self-pay | Admitting: Family Medicine

## 2019-08-20 NOTE — Telephone Encounter (Signed)
Yes, she needs to keep her appt b/c the blood work we did for fatigue did not include her cholesterol panel  Yes, she should get the COVID vaccine and can get it at her convenience.  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

## 2019-08-20 NOTE — Telephone Encounter (Signed)
Pt called in asking if she needs to keep her June 3 appt with Tabori she states she just had BW done.   She also would like to know when she should get her covid vac, and which vac she should get?   Please advise

## 2019-08-20 NOTE — Telephone Encounter (Signed)
Patient notified of PCP recommendations and is agreement and expresses an understanding.  

## 2019-08-20 NOTE — Telephone Encounter (Signed)
Please advise 

## 2019-08-21 ENCOUNTER — Other Ambulatory Visit: Payer: Medicare Other

## 2019-08-28 ENCOUNTER — Encounter: Payer: Self-pay | Admitting: Family Medicine

## 2019-08-30 ENCOUNTER — Ambulatory Visit: Payer: Medicare Other | Admitting: Family Medicine

## 2019-09-05 ENCOUNTER — Encounter: Payer: Self-pay | Admitting: Family Medicine

## 2019-09-05 ENCOUNTER — Other Ambulatory Visit: Payer: Self-pay

## 2019-09-05 ENCOUNTER — Ambulatory Visit (INDEPENDENT_AMBULATORY_CARE_PROVIDER_SITE_OTHER): Payer: Medicare Other | Admitting: Family Medicine

## 2019-09-05 VITALS — BP 136/84 | HR 74 | Temp 98.0°F | Resp 16 | Ht 61.0 in | Wt 105.1 lb

## 2019-09-05 DIAGNOSIS — I10 Essential (primary) hypertension: Secondary | ICD-10-CM

## 2019-09-05 DIAGNOSIS — H4020X3 Unspecified primary angle-closure glaucoma, severe stage: Secondary | ICD-10-CM

## 2019-09-05 DIAGNOSIS — M8580 Other specified disorders of bone density and structure, unspecified site: Secondary | ICD-10-CM | POA: Diagnosis not present

## 2019-09-05 DIAGNOSIS — E785 Hyperlipidemia, unspecified: Secondary | ICD-10-CM | POA: Diagnosis not present

## 2019-09-05 LAB — LIPID PANEL
Cholesterol: 157 mg/dL (ref 0–200)
HDL: 60.9 mg/dL (ref >39.00)
LDL Cholesterol: 86 mg/dL (ref 0–99)
NonHDL: 96.02
Total CHOL/HDL Ratio: 3
Triglycerides: 49 mg/dL (ref 0.0–149.0)
VLDL: 9.8 mg/dL (ref 0.0–40.0)

## 2019-09-05 LAB — CBC WITH DIFFERENTIAL/PLATELET
Basophils Absolute: 0 10*3/uL (ref 0.0–0.1)
Basophils Relative: 0.6 % (ref 0.0–3.0)
Eosinophils Absolute: 0.2 10*3/uL (ref 0.0–0.7)
Eosinophils Relative: 3.7 % (ref 0.0–5.0)
HCT: 37.9 % (ref 36.0–46.0)
Hemoglobin: 12.9 g/dL (ref 12.0–15.0)
Lymphocytes Relative: 13.9 % (ref 12.0–46.0)
Lymphs Abs: 0.7 10*3/uL (ref 0.7–4.0)
MCHC: 33.9 g/dL (ref 30.0–36.0)
MCV: 90.9 fl (ref 78.0–100.0)
Monocytes Absolute: 0.6 10*3/uL (ref 0.1–1.0)
Monocytes Relative: 11.7 % (ref 3.0–12.0)
Neutro Abs: 3.5 10*3/uL (ref 1.4–7.7)
Neutrophils Relative %: 70.1 % (ref 43.0–77.0)
Platelets: 339 10*3/uL (ref 150.0–400.0)
RBC: 4.16 Mil/uL (ref 3.87–5.11)
RDW: 13.1 % (ref 11.5–15.5)
WBC: 4.9 10*3/uL (ref 4.0–10.5)

## 2019-09-05 LAB — BASIC METABOLIC PANEL
BUN: 10 mg/dL (ref 6–23)
CO2: 30 mEq/L (ref 19–32)
Calcium: 9.6 mg/dL (ref 8.4–10.5)
Chloride: 95 mEq/L — ABNORMAL LOW (ref 96–112)
Creatinine, Ser: 0.57 mg/dL (ref 0.40–1.20)
GFR: 106.56 mL/min (ref >60.00)
Glucose, Bld: 105 mg/dL — ABNORMAL HIGH (ref 70–99)
Potassium: 4.6 mEq/L (ref 3.5–5.1)
Sodium: 132 mEq/L — ABNORMAL LOW (ref 135–145)

## 2019-09-05 LAB — HEPATIC FUNCTION PANEL
ALT: 21 U/L (ref 0–35)
AST: 25 U/L (ref 0–37)
Albumin: 4.6 g/dL (ref 3.5–5.2)
Alkaline Phosphatase: 93 U/L (ref 39–117)
Bilirubin, Direct: 0.1 mg/dL (ref 0.0–0.3)
Total Bilirubin: 0.4 mg/dL (ref 0.2–1.2)
Total Protein: 7.4 g/dL (ref 6.0–8.3)

## 2019-09-05 LAB — TSH: TSH: 0.94 u[IU]/mL (ref 0.35–4.50)

## 2019-09-05 MED ORDER — LATANOPROST 0.005 % OP SOLN: 1.0000 [drp] | Freq: Every day | OPHTHALMIC | 12 refills | Status: AC

## 2019-09-05 NOTE — Patient Instructions (Signed)
Follow up in 6 months to recheck BP and cholesterol We'll notify you of your lab results and make any changes if needed Start a once daily multivitamin Continue to take daily Calcium and Vit D- 1200 units of Calcium and 2,000-5,000 units of Vit D Call with any questions or concerns Hang in there!

## 2019-09-05 NOTE — Progress Notes (Signed)
   Subjective:    Patient ID: Theresa Martin, female    DOB: 1956-02-17, 64 y.o.   MRN: [id number]  HPI HTN- chronic problem, on Triamterene HCTZ 37.5/25mg  daily w/ adequate control.  Pt is exercising regularly.  No CP.  Some SOB but following w/ pulmonary.  + HAs- following w/ eye doctor and neuro.  Glaucoma- was started on Latanoprost by eye doctor.  Previously had laser surgery in both eyes to relieve pressure.    Hyperlipidemia- chronic problem, on Lipitor 20mg  daily.  Denies abd pain, N/V.  'I have a lot of questions about vitamins'- drinking coconut water, antioxidant drinks, Vitamin water.  Taking Vit D daily.  Wants to know which vitamins are required and which are optional.   Review of Systems For ROS see HPI   This visit occurred during the SARS-CoV-2 public health emergency.  Safety protocols were in place, including screening questions prior to the visit, additional usage of staff PPE, and extensive cleaning of exam room while observing appropriate contact time as indicated for disinfecting solutions.       Objective:   Physical Exam Vitals reviewed.  Constitutional:      General: She is not in acute distress.    Appearance: Normal appearance. She is well-developed.  HENT:     Head: Normocephalic and atraumatic.  Eyes:     Conjunctiva/sclera: Conjunctivae normal.     Pupils: Pupils are equal, round, and reactive to light.  Neck:     Thyroid: No thyromegaly.  Cardiovascular:     Rate and Rhythm: Normal rate and regular rhythm.     Heart sounds: Normal heart sounds. No murmur heard.   Pulmonary:     Effort: Pulmonary effort is normal. No respiratory distress.     Breath sounds: Normal breath sounds.  Abdominal:     General: There is no distension.     Palpations: Abdomen is soft.     Tenderness: There is no abdominal tenderness.  Musculoskeletal:     Cervical back: Normal range of motion and neck supple.  Lymphadenopathy:     Cervical: No cervical  adenopathy.  Skin:    General: Skin is warm and dry.  Neurological:     Mental Status: She is alert and oriented to person, place, and time.  Psychiatric:        Behavior: Behavior normal.           Assessment & Plan:

## 2019-09-07 ENCOUNTER — Ambulatory Visit: Payer: Medicare Other | Admitting: Family Medicine

## 2019-09-10 NOTE — Telephone Encounter (Signed)
Follow up MRI. -VRP

## 2019-09-13 ENCOUNTER — Other Ambulatory Visit: Payer: Self-pay | Admitting: Family Medicine

## 2019-09-19 NOTE — Telephone Encounter (Signed)
She should be seen to reevaluate her to see what is going on either with me or an APP.

## 2019-09-19 NOTE — Telephone Encounter (Signed)
Dr. Patsey Berthold please advise, as Dr. Mortimer Fries is unavailable.

## 2019-09-20 ENCOUNTER — Ambulatory Visit: Payer: Medicare Other | Admitting: Adult Health

## 2019-09-20 ENCOUNTER — Telehealth: Payer: Self-pay | Admitting: Internal Medicine

## 2019-09-20 NOTE — Telephone Encounter (Signed)
Called and spoke pt, who reports of increased sob x3mo. Sob worsens with heat.  Pt is unsure if her medications need to be adjusted. LG recommended that pt be re evaluated.  Pt has been scheduled for virtual visit today at 4:30 with Rexene Edison, NP. Nothing further is needed

## 2019-09-21 ENCOUNTER — Ambulatory Visit: Payer: Medicare Other

## 2019-09-21 ENCOUNTER — Ambulatory Visit
Admission: RE | Admit: 2019-09-21 | Discharge: 2019-09-21 | Disposition: A | Discharge status: Home / Self Care | Payer: Medicare Other | Source: Ambulatory Visit | Attending: Diagnostic Neuroimaging | Admitting: Diagnostic Neuroimaging

## 2019-09-21 ENCOUNTER — Other Ambulatory Visit: Payer: Self-pay

## 2019-09-21 ENCOUNTER — Other Ambulatory Visit: Payer: Self-pay | Admitting: Family

## 2019-09-21 ENCOUNTER — Telehealth (INDEPENDENT_AMBULATORY_CARE_PROVIDER_SITE_OTHER): Payer: Medicare Other | Admitting: Adult Health

## 2019-09-21 ENCOUNTER — Encounter: Payer: Self-pay | Admitting: Adult Health

## 2019-09-21 DIAGNOSIS — H5712 Ocular pain, left eye: Secondary | ICD-10-CM

## 2019-09-21 DIAGNOSIS — J4520 Mild intermittent asthma, uncomplicated: Secondary | ICD-10-CM | POA: Diagnosis not present

## 2019-09-21 MED ORDER — PREDNISONE 10 MG PO TABS: ORAL_TABLET | ORAL | 0 refills | Status: DC

## 2019-09-21 MED ORDER — GADOBENATE DIMEGLUMINE 529 MG/ML IV SOLN
10.0000 mL | Freq: Once | INTRAVENOUS | Status: AC | PRN
  Administered 2019-09-21: 10 mL via INTRAVENOUS

## 2019-09-21 NOTE — Patient Instructions (Addendum)
Prednisone taper over next week.  Continue on QVAR 2 puffs Twice daily  ,brush/rinse and gargle after use.  Mucinex DM Twice daily  As needed  Cough /congestion  Chest xray today .  Follow up with Dr. Mortimer Fries in 4-6 weeks and As needed  [facility] office .  Please contact office for sooner follow up if symptoms do not improve or worsen or seek emergency care

## 2019-09-21 NOTE — Progress Notes (Signed)
Virtual Visit via Video Note  I connected with Theresa Martin on 09/21/19 at 10:30 AM EDT by a video enabled telemedicine application and verified that I am speaking with the correct person using two identifiers.  Location: Patient: Home  Provider: Office    I discussed the limitations of evaluation and management by telemedicine and the availability of in person appointments. The patient expressed understanding and agreed to proceed.  History of Present Illness: 64 yo female never smoker followed    Today's video visit is an acute office visit for asthma.  Patient complains over the last month that she has had some intermittent cough and wheezing.  She says she was treated for an upper respiratory infection/bronchitis last month.  She was treated with amoxicillin which did improve with decreased cough and congestion however she has residual ongoing dry cough that is minimally productive and intermittent wheezing. Patient has received her Covid vaccine #1 on September 03, 2019.  She denies any fever, loss of taste or smell, known sick contact exposure.  She continues on Qvar 2 puffs daily.  Has had to use her albuterol once or twice daily over the last few weeks.  Patient Active Problem List   Diagnosis Date Noted  . GERD (gastroesophageal reflux disease) 02/28/2019  . Schizo affective schizophrenia ([place]) 02/01/2019  . Abnormal chest CT 12/11/2018  . Left sided numbness 10/22/2018  . Nonallopathic lesion of thoracic region 09/05/2018  . Nonallopathic lesion of rib cage 09/05/2018  . Nonallopathic lesion of cervical region 09/05/2018  . Nonallopathic lesion of lumbosacral region 09/05/2018  . Nonallopathic lesion of sacral region 09/05/2018  . Skin cancer   . Osteopenia after menopause   . Chronic right shoulder pain 03/30/2017  . Right shoulder pain 01/12/2017  . Tendinopathy of rotator cuff 12/20/2016  . Angle-closure glaucoma, severe stage 08/19/2015  . Osteopenia 06/19/2015  .  Migraine without aura and without status migrainosus, not intractable 01/13/2015  . High risk medications (not anticoagulants) long-term use 09/26/2014  . Routine general medical examination at a health care facility 07/27/2012  . Hyperlipidemia 09/29/2010  . URETEROPELVIC JUNCTION OBSTRUCTION, CONGENITAL 12/15/2009  . CONSTIPATION 12/11/2009  . CARDIAC MURMUR 11/26/2008  . MAGNETIC RESONANCE IMAGING, BRAIN, ABNORMAL 10/29/2008  . VENEREAL WART 10/23/2008  . Major depressive disorder, recurrent episode ([place]) 10/23/2008  . Essential hypertension 10/23/2008  . ALLERGIC RHINITIS 10/23/2008  . Asthma 10/23/2008  . RENAL CALCULUS, HX OF 10/23/2008    Current Outpatient Medications on File Prior to Visit  Medication Sig Dispense Refill  . albuterol (VENTOLIN HFA) 108 (90 Base) MCG/ACT inhaler TAKE 2 PUFFS BY MOUTH EVERY 6 HOURS AS NEEDED FOR WHEEZE OR SHORTNESS OF BREATH 18 g 2  . atorvastatin (LIPITOR) 20 MG tablet TAKE 1 TABLET BY MOUTH EVERY DAY 90 tablet 2  . beclomethasone (QVAR) 80 MCG/ACT inhaler Inhale 1-2 puffs into the lungs 2 (two) times daily. 10.6 g 1  . Calcium-Vitamin D-Vitamin K (CALCIUM + D + K PO) Take 3 each by mouth daily. Calcium 500mg - Vitamin D 1000iu- K 40mg      . clonazePAM (KLONOPIN) 0.5 MG tablet Take 0.5 mg by mouth daily as needed.    Marland Kitchen escitalopram (LEXAPRO) 10 MG tablet Take 10 mg by mouth daily.    Marland Kitchen gabapentin (NEURONTIN) 400 MG capsule Take 1 capsule (400 mg total) by mouth 2 (two) times daily. 60 capsule 12  . latanoprost (XALATAN) 0.005 % ophthalmic solution Place 1 drop into both eyes at bedtime. 2.5 mL 12  .  montelukast (SINGULAIR) 10 MG tablet Take 10 mg by mouth daily.    . QUEtiapine (SEROQUEL XR) 400 MG 24 hr tablet Take 1 tablet (400 mg total) by mouth at bedtime. 30 tablet 1  . temazepam (RESTORIL) 15 MG capsule Take 1 capsule (15 mg total) by mouth at bedtime. 30 capsule 0  . triamterene-hydrochlorothiazide (MAXZIDE-25) 37.5-25 MG tablet TAKE 1  TABLET BY MOUTH EVERY DAY 90 tablet 2   No current facility-administered medications on file prior to visit.       Observations/Objective: She appears well with no apparent distress.  Speaks in full sentences with no visual accessory muscle use  Assessment and Plan: Acute asthma flare with recent upper respiratory infection. We will treat with a short course of prednisone.  Continue on her regimen with Qvar.  And follow-up in the office Check chest x-ray  Plan  Patient Instructions  Prednisone taper over next week.  Continue on QVAR 2 puffs Twice daily  ,brush/rinse and gargle after use.  Mucinex DM Twice daily  As needed  Cough /congestion  Chest xray today .  Follow up with Dr. Mortimer Fries in 4-6 weeks and As needed  [facility] office .  Please contact office for sooner follow up if symptoms do not improve or worsen or seek emergency care        Follow Up Instructions: Follow-up in 4 to 6 weeks and as needed   I discussed the assessment and treatment plan with the patient. The patient was provided an opportunity to ask questions and all were answered. The patient agreed with the plan and demonstrated an understanding of the instructions.   The patient was advised to call back or seek an in-person evaluation if the symptoms worsen or if the condition fails to improve as anticipated.  I provided 22 minutes of non-face-to-face time during this encounter.   Rexene Edison, NP

## 2019-09-24 ENCOUNTER — Telehealth: Payer: Self-pay | Admitting: Adult Health

## 2019-09-24 ENCOUNTER — Ambulatory Visit
Admission: RE | Admit: 2019-09-24 | Discharge: 2019-09-24 | Disposition: A | Discharge status: Home / Self Care | Payer: Medicare Other | Source: Ambulatory Visit | Attending: Adult Health | Admitting: Adult Health

## 2019-09-24 ENCOUNTER — Other Ambulatory Visit: Payer: Self-pay

## 2019-09-24 ENCOUNTER — Ambulatory Visit
Admission: RE | Admit: 2019-09-24 | Discharge: 2019-09-24 | Disposition: A | Discharge status: Home / Self Care | Payer: Medicare Other | Attending: Adult Health | Admitting: Adult Health

## 2019-09-24 DIAGNOSIS — J4520 Mild intermittent asthma, uncomplicated: Secondary | ICD-10-CM

## 2019-09-24 NOTE — Telephone Encounter (Signed)
CXR has been ordered for Theresa Martin imaging as requested by pt. Pt is aware and voiced her understanding.  Nothing further is needed.

## 2019-09-24 NOTE — Addendum Note (Signed)
Addended by: Claudette Head A on: 09/24/2019 01:20 PM   Modules accepted: Orders

## 2019-09-25 ENCOUNTER — Telehealth: Payer: Self-pay | Admitting: *Deleted

## 2019-09-25 NOTE — Telephone Encounter (Signed)
Spoke with patient and informed her the MRI brain showed unremarkable imaging results. No change from prior MRI 2020.  Patient verbalized understanding, appreciation.

## 2019-09-26 NOTE — Assessment & Plan Note (Signed)
Given her hx of Osteopenia encouraged her to continue Vit D.  Discussed that a number of the vitamins she's taking are unnecessary.  Will follow.

## 2019-09-26 NOTE — Assessment & Plan Note (Signed)
Chronic problem.  Tolerating statin w/o difficulty.  Check labs.  Adjust meds prn  

## 2019-09-26 NOTE — Assessment & Plan Note (Signed)
Chronic problem, adequate control.  Asymptomatic.  Check labs.  No anticipated med changes.  Will follow. 

## 2019-09-26 NOTE — Assessment & Plan Note (Signed)
Now on Latanoprost per eye doctor.

## 2019-09-27 ENCOUNTER — Encounter: Payer: Self-pay | Admitting: Family Medicine

## 2019-09-28 ENCOUNTER — Telehealth: Payer: Self-pay | Admitting: Adult Health

## 2019-09-28 ENCOUNTER — Encounter: Payer: Self-pay | Admitting: Family Medicine

## 2019-09-28 NOTE — Telephone Encounter (Signed)
Pt is questioning if she should wait to get her second vaccine, as she is currently taking prednisone.  During our conversation, pt stated that she would speak with the pharmacist for recommendations.  Advised pt to call us back if pharmacist was unable to help her.  Nothing further is needed at this time.

## 2019-10-02 ENCOUNTER — Telehealth: Payer: Self-pay | Admitting: Internal Medicine

## 2019-10-02 NOTE — Telephone Encounter (Signed)
That is fine to go ahead and get now if she is feeling better.  Please contact office for sooner follow up if symptoms do not improve or worsen or seek emergency care ]

## 2019-10-02 NOTE — Telephone Encounter (Signed)
Chronic scarring noted. Continue with office visit recommendations and follow-up   Previous CT chest August 2020 showed chronic changes in the right upper lobe possible post inflammatory/infectious changes. If symptoms continue consider high-resolution CT chest will discuss on follow-up visit in detail  Pt is aware of results and voiced her understanding.  Pt has been scheduled for mychart visit with Tammy Parrett on 10/22/2019 for 4-6wk rov, Dr. Mortimer Fries does not have any availability. Nothing further is needed at this time.

## 2019-10-02 NOTE — Telephone Encounter (Signed)
Spoke to pt, who is requesting Dr. Zoila Shutter advice on taking 2nd covid vaccine, as she was recently on prednisone and completed course on 09/29/2019. Pt is questioning if it is okay to proceed with vaccine.   Dr. Mortimer Fries please advise. Thanks

## 2019-10-02 NOTE — Telephone Encounter (Signed)
Tammy Parrett, please advise. Thanks

## 2019-10-03 ENCOUNTER — Telehealth: Payer: Self-pay | Admitting: Emergency Medicine

## 2019-10-03 ENCOUNTER — Telehealth: Payer: Self-pay | Admitting: Internal Medicine

## 2019-10-03 NOTE — Telephone Encounter (Signed)
Noted, ED referral was the appropriate recommendation.  She is a patient of Dr. Zoila Shutter.  Routing to Dr. Mortimer Fries as well.

## 2019-10-03 NOTE — Telephone Encounter (Signed)
Based on the [facility] Triage note advised patient to go to ED for evaluation. Patient declined and wanted to get her CT order at the imaging center in [place]. Patient states she didn't want [facility] to order the CT Chest, this was discussion with Pulmonology was the next steps after her CXR and symptoms hasn't improved after the Prednisone taper. Patient was calling to see if she could have a breathing treatment completed at the office.  Advised patient we were not having any sick patient's in the office so breathing treatment would not be possible. She was agreeable with this. Currently on the phone patient is talking and breathing normal. Denies any sob. She is using her inhalers (Albuterol and Qvar as directed) and staying inside away from heat and humidity. Advised patient happy that she is doing better than when she called. She has contacted her Pulmonologist and waiting on next steps for the CT chest.  Advised patient is she has another attack then she would need to go to the ER for evaluation and she was agreeable.

## 2019-10-03 NOTE — Telephone Encounter (Signed)
[place] RECORD [facility]urse Patient Name: Theresa Martin Gender: Female DOB: 1956/03/08 Age: 64 Y 9 M 1 D Return Phone Number: [phone] (Primary) Address: City/State/Zip: [place] [address] Client [facility] - Client Site [facility] - Day Physician Dimple Nanas- MD Contact Type Call Who Is Calling Patient / Member / Family / Caregiver Call Type Triage / Clinical Relationship To Patient Self Return Phone Number [phone] (Primary) Chief Complaint CHEST PAIN (>=21 years) - pain, pressure, heaviness or tightness Reason for Call Symptomatic / Request for [place] states she is having chest pain. Translation No Nurse Assessment Nurse: Raphael Gibney, RN, Vanita Ingles Date/Time (Eastern Time): 10/03/2019 8:48:21 AM Confirm and document reason for call. If symptomatic, describe symptoms. ---Caller states she is having chest pain. Pain is in upper right chest going to her back since yesterday. finished prednisone treatment last Saturday. had SOB yesterday. Has been using Q var and rescue inhaler twice a day. She still has some SOB. has appt with pulmonary doctor on July 26th. She wants to know if an order for CT scan can be sent to the imaging center in [place]. Has the patient had close contact with a person known or suspected to have the novel coronavirus illness OR traveled / lives in area with major community spread (including international travel) in the last 14 days from the onset of symptoms? * If Asymptomatic, screen for exposure and travel within the last 14 days. ---No Does the patient have any new or worsening symptoms? ---Yes Will a triage be completed? ---Yes Related visit to physician within the last 2 weeks? ---Yes Does the PT have any chronic conditions? (i.e. diabetes, asthma, this includes High risk factors for  pregnancy, etc.) ---Yes List chronic conditions. ---asthma Is this a behavioral health or substance abuse call? ---No Guidelines Guideline Title Affirmed Question Affirmed Notes Nurse Date/Time (Eastern Time) Asthma Attack Chest pain Raphael Gibney, RN, Vanita Ingles 10/03/2019 8:53:55 AMPLEASE NOTE: All timestamps contained within this report are represented as [place] Standard Time. CONFIDENTIALTY NOTICE: This fax transmission is intended only for the addressee. It contains information that is legally privileged, confidential or otherwise protected from use or disclosure. If you are not the intended recipient, you are strictly prohibited from reviewing, disclosing, copying using or disseminating any of this information or taking any action in reliance on or regarding this information. If you have received this fax in error, please notify us immediately by telephone so that we can arrange for its return to [place]. Phone: [phone], Toll-Free: [phone], Fax: [phone] Page: 2 of 2 Call Id: [id number] [place]. Time Eilene Ghazi Time) Disposition Final User 10/03/2019 8:45:55 AM Send to Urgent Queue Christel Mormon 10/03/2019 9:01:16 AM Go to ED Now Yes Raphael Gibney, RN, Doreatha Lew Disagree/Comply Disagree Caller Understands Yes PreDisposition Call Doctor Care Advice Given Per Guideline GO TO ED NOW: * You need to be seen in the Emergency Department. CARE ADVICE given per Asthma Attack (Adult) guideline. Comments User: Dannielle Burn, RN Date/Time Eilene Ghazi Time): 10/03/2019 8:57:44 AM pt states she is not going to the ER but wants order for CT scan sent to the imaging center for a chest CT scan. User: Dannielle Burn, RN Date/Time Eilene Ghazi Time): 10/03/2019 9:00:58 AM Called back line and spoke to Judson Roch and gave report that pt has chest pain with SIB after using hre inhalers. Triage outcome of go to ER now but pt does not want to go but  wants to know if an order for CT scan of chest can be sent to the imaging center in  [place] REFUSED

## 2019-10-03 NOTE — Telephone Encounter (Signed)
CMA note reviewed. Agree that if Pulmonology has been assessing and managing this then should defer to them. If there is any recurrence of attack not calming down with her medications she would need evaluation, potentially at nearest ER.

## 2019-10-03 NOTE — Telephone Encounter (Signed)
Pt reports of increased sob, right upper chest pain that radiates into right upper back. Pt completed course of prednisone on 09/29/2019. Pt stated that her breathing improved with prednisone, however she woke up yesterday with these new symptoms.  Pt denied arm discomfort, vomiting or nausea.  Pt stated that she felt that she was going to pass out yesterday due to dizziness and sob. She is using albuterol HFA and dulera with no relief.  I have recommended that pt go to ED for further evaluation due to her symptoms.  Pt is agreeable and had no further questions.   Will route to Dr. Patsey Berthold as Juluis Rainier, as Dr. Mortimer Fries is currently unavailable.

## 2019-10-05 ENCOUNTER — Telehealth: Payer: Self-pay | Admitting: Internal Medicine

## 2019-10-05 NOTE — Telephone Encounter (Signed)
Spoke to pt, who stated that her tongue and throat is raw. Pt feels that this is related to her inhalers.  Pt spoke with her pharmacist, who recommended that she use a spacer.  Pt is currently using cepacol mouthwash.    Dr. Patsey Berthold, please advise as Dr.kasa is unavailable.

## 2019-10-05 NOTE — Telephone Encounter (Signed)
Spoke to pt and relayed below message/recommendations. Pt is aware that a spacer will be placed up front for pickup on Monday, as I am currently in the [place] office.  Will call pt when spacer is ready for pickup.

## 2019-10-05 NOTE — Telephone Encounter (Signed)
Pt is aware that Dr. Mortimer Fries is currently unavailable and will be back on 10/08/2019. Pt stated that he received her 2nd covid vaccine yesterday, however she would still like his thoughts on it.

## 2019-10-05 NOTE — Telephone Encounter (Signed)
Patient of Dr. Zoila Shutter.  She should avoid Cepacol or similar mouthwash.  Absolutely no alcohol on mouthwash.  Cepacol is 14% alcohol mouthwash.  This is very irritating to the mouth and makes her more sensitive to the inhalers.  I recommend Biotene mouthwash which is made for dry mouth or just simply using a little bit of sodium bicarbonate in water and rinsing after using inhalers.  Agree that a spacer would also help.  Dr. Darnell Level.

## 2019-10-05 NOTE — Telephone Encounter (Signed)
Will also route to Dr. Mortimer Fries.

## 2019-10-08 NOTE — Telephone Encounter (Signed)
Pt is aware of below message/recommendations and voiced her understanding. Nothing further is needed.

## 2019-10-08 NOTE — Telephone Encounter (Signed)
Pt is aware that spacer has been placed up front for pickup. Nothing further is needed.

## 2019-10-08 NOTE — Telephone Encounter (Signed)
I recommend vaccines for all adults but she has to understand that there maybe side affects which can vary between people. She has to be willing to take the risks

## 2019-10-22 ENCOUNTER — Encounter: Payer: Self-pay | Admitting: Adult Health

## 2019-10-22 ENCOUNTER — Other Ambulatory Visit: Payer: Self-pay | Admitting: Emergency Medicine

## 2019-10-22 ENCOUNTER — Telehealth (INDEPENDENT_AMBULATORY_CARE_PROVIDER_SITE_OTHER): Payer: Medicare Other | Admitting: Adult Health

## 2019-10-22 DIAGNOSIS — J4520 Mild intermittent asthma, uncomplicated: Secondary | ICD-10-CM

## 2019-10-22 DIAGNOSIS — J4530 Mild persistent asthma, uncomplicated: Secondary | ICD-10-CM

## 2019-10-22 MED ORDER — ALBUTEROL SULFATE HFA 108 (90 BASE) MCG/ACT IN AERS: INHALATION_SPRAY | RESPIRATORY_TRACT | 2 refills | Status: DC

## 2019-10-22 MED ORDER — BUDESONIDE-FORMOTEROL FUMARATE 160-4.5 MCG/ACT IN AERO
2.0000 | INHALATION_SPRAY | Freq: Two times a day (BID) | RESPIRATORY_TRACT | 5 refills | Status: DC
Start: 2019-10-22 — End: 2020-04-23

## 2019-10-22 NOTE — Patient Instructions (Addendum)
Change QVAR to Symbicort 160/4.60mcg 2 puffs Twice daily  , rinse after use.  Mucinex DM Twice daily  As needed  Cough /congestion  Follow up with Dr. Mortimer Fries in 4-6 weeks with PFT  and As needed  [facility] office . In person visit .  Please contact office for sooner follow up if symptoms do not improve or worsen or seek emergency care

## 2019-10-22 NOTE — Progress Notes (Signed)
Virtual Visit via Video Note  I connected with Theresa Martin on 10/22/19 at  2:00 PM EDT by a video enabled telemedicine application and verified that I am speaking with the correct person using two identifiers.  Location: Patient: Home  Provider: Office    I discussed the limitations of evaluation and management by telemedicine and the availability of in person appointments. The patient expressed understanding and agreed to proceed.  History of Present Illness: 64 yo female never smoker followed for Asthma   Today's video visit is a follow up for Asthma. Was seen 1 month ago with Asthma flare , treated with steroid taper and continued on QVAR . Says she got much better back to baseline . Noticed last 3 days with high temps and humidity breathing not doing as well, increased albuterol use.  No fever, cough ,or chest pain. No edema. Chest xray last visit with increased interstitial markings. CT chest 10/2018 showed chronic changes in RUL felt from previous PNA  And multiple 3 mm nodules.   Past Medical History:  Diagnosis Date  . Allergy   . Anxiety   . Depression   . Hyperlipidemia   . Hypertension   . Osteopenia after menopause   . Persistent headaches   . Pneumonia 2020  . Postherpetic neuralgia   . Renal disorder   . Shingles outbreak 02/2014  . Skin cancer    moles on right groin and right buttock.    Current Outpatient Medications on File Prior to Visit  Medication Sig Dispense Refill  . albuterol (VENTOLIN HFA) 108 (90 Base) MCG/ACT inhaler TAKE 2 PUFFS BY MOUTH EVERY 6 HOURS AS NEEDED FOR WHEEZE OR SHORTNESS OF BREATH 18 g 2  . atorvastatin (LIPITOR) 20 MG tablet TAKE 1 TABLET BY MOUTH EVERY DAY 90 tablet 2  . beclomethasone (QVAR) 80 MCG/ACT inhaler Inhale 1-2 puffs into the lungs 2 (two) times daily. 10.6 g 1  . Calcium-Vitamin D-Vitamin K (CALCIUM + D + K PO) Take 3 each by mouth daily. Calcium 500mg - Vitamin D 1000iu- K 40mg      . clonazePAM (KLONOPIN) 0.5 MG  tablet Take 0.5 mg by mouth daily as needed.    Marland Kitchen escitalopram (LEXAPRO) 10 MG tablet Take 10 mg by mouth daily.    Marland Kitchen gabapentin (NEURONTIN) 400 MG capsule Take 1 capsule (400 mg total) by mouth 2 (two) times daily. 60 capsule 12  . latanoprost (XALATAN) 0.005 % ophthalmic solution Place 1 drop into both eyes at bedtime. 2.5 mL 12  . montelukast (SINGULAIR) 10 MG tablet Take 10 mg by mouth daily.    . predniSONE (DELTASONE) 10 MG tablet 4 tabs for 2 days, then 3 tabs for 2 days, 2 tabs for 2 days, then 1 tab for 2 days, then stop 20 tablet 0  . QUEtiapine (SEROQUEL XR) 400 MG 24 hr tablet Take 1 tablet (400 mg total) by mouth at bedtime. 30 tablet 1  . temazepam (RESTORIL) 15 MG capsule Take 1 capsule (15 mg total) by mouth at bedtime. 30 capsule 0  . triamterene-hydrochlorothiazide (MAXZIDE-25) 37.5-25 MG tablet TAKE 1 TABLET BY MOUTH EVERY DAY 90 tablet 2   No current facility-administered medications on file prior to visit.        Observations/Objective: Speaks in full sentences, no accessory muscle use.   Assessment and Plan: Asthma - Mild to moderate persistent with increased symptom burden  Needs to escalate treatment regimen -change ICS to ICS/LABA  Check PFT on return   Plan  Patient Instructions  Change QVAR to Symbicort 160/4.60mcg 2 puffs Twice daily  , rinse after use.  Mucinex DM Twice daily  As needed  Cough /congestion  Follow up with Dr. Mortimer Fries in 4-6 weeks with PFT  and As needed  [facility] office . In person visit .  Please contact office for sooner follow up if symptoms do not improve or worsen or seek emergency care        Follow Up Instructions: Follow up in 4-6 weeks and As needed     I discussed the assessment and treatment plan with the patient. The patient was provided an opportunity to ask questions and all were answered. The patient agreed with the plan and demonstrated an understanding of the instructions.   The patient was advised to call back or  seek an in-person evaluation if the symptoms worsen or if the condition fails to improve as anticipated.  I provided 22  minutes of non-face-to-face time during this encounter.   Rexene Edison, NP

## 2019-10-23 ENCOUNTER — Ambulatory Visit (INDEPENDENT_AMBULATORY_CARE_PROVIDER_SITE_OTHER): Payer: Medicare Other | Admitting: Primary Care

## 2019-10-23 ENCOUNTER — Encounter: Payer: Self-pay | Admitting: Primary Care

## 2019-10-23 ENCOUNTER — Telehealth: Payer: Self-pay | Admitting: Internal Medicine

## 2019-10-23 ENCOUNTER — Other Ambulatory Visit: Payer: Self-pay

## 2019-10-23 ENCOUNTER — Other Ambulatory Visit: Payer: Self-pay | Admitting: Primary Care

## 2019-10-23 VITALS — BP 128/80 | HR 85 | Temp 97.5°F | Ht 61.0 in | Wt 108.2 lb

## 2019-10-23 DIAGNOSIS — R252 Cramp and spasm: Secondary | ICD-10-CM | POA: Insufficient documentation

## 2019-10-23 DIAGNOSIS — R5383 Other fatigue: Secondary | ICD-10-CM | POA: Insufficient documentation

## 2019-10-23 DIAGNOSIS — J4520 Mild intermittent asthma, uncomplicated: Secondary | ICD-10-CM | POA: Diagnosis not present

## 2019-10-23 NOTE — Assessment & Plan Note (Signed)
-   Patient reports morning fatigue and headaches - She is established with [facility] neurology, recommend she follow-up with them to discuss possible sleep study to r/o sleep apnea as cause of fatigue. Other potential causes could be allergies or chronic sinusitis which was seen on MRI Brain in June

## 2019-10-23 NOTE — Telephone Encounter (Signed)
Sorry to hear she did not tolerate Symbicort . If she is having a severe asthma attack, needs to go to ER and be evaluated ASAP.   If she is feeling better from Symbicort intolerance , it does cause nervousness in some patients taking a few hours to wear off. It is like a long acting albuterol.   If she is worse needs to be seen in person visit here, PCP , urgent care or ER for further evaluation   Please contact office for sooner follow up if symptoms do not improve or worsen or seek emergency care

## 2019-10-23 NOTE — Patient Instructions (Addendum)
Recommendations: Decreased Symbicort to one puff every 12 hours Use aerochamber (spacer) with Symbicort   Orders: Pulmonary function testing Labs (BMET-potassium, Magnesium and IgE)  Follow-up: Recommend 4-6 weeks with Dr. Mortimer Fries per Tammy yesterday

## 2019-10-23 NOTE — Telephone Encounter (Signed)
Called and spoke with patient she is going to come in for an appointment today at 4:30 with Derl Barrow. Nothing further needed at this time.

## 2019-10-23 NOTE — Telephone Encounter (Signed)
Patient was given Symbicort yesterday and feels like her breathing has got worse. After taking it she was Dizzy and her head started hurting and feels like she can't get air. Patient feels like she may have an allergic reaction to it. She states she is a little better now since its been about 2 hours and she used her rescue inhaler and took ibuprofen.  Dr. Mortimer Fries is not back in the office until 9/28 and is completely full those 3 days and that is all that is showing of him right now.  Eustaquio Maize has an opening today at 4:30 is she needs to be seen.  Tammy please advise

## 2019-10-23 NOTE — Assessment & Plan Note (Signed)
-   Checking basic labs - Recommend patient follow-up with PCP

## 2019-10-23 NOTE — Assessment & Plan Note (Signed)
-   Started on Symbicort 160 yesterday d/t increased symptom burden - Patient reports burning sensation on tongue after first use of Symbicort today - Recommend decreasing Symbicort 160 to one puff twice daily and using AeroChamber with inhaler - Patient needs follow-up with Dr. Mortimer Fries in 4 to 6 weeks with PFTs

## 2019-10-23 NOTE — Progress Notes (Signed)
@Patient  ID: Theresa Martin, female    DOB: [birth year], 64 y.o.   MRN: [id number]  Chief Complaint  Patient presents with  . Follow-up    Asthma, reaction to symbicort    Referring provider: [name], MD  HPI: 64 yo female, never smoked. PMH significant for asthma, allergic rhinitis, GERD, hypertension, migraine without aura, osteopenia.  Patient of Dr. Mortimer Fries, last seen by [facility] yesterday for virtual video visit.  Previous [facility] pulmonary encounters: 10/22/19- Rexene Edison, NP Today's video visit is a follow up for Asthma. Was seen 1 month ago with Asthma flare , treated with steroid taper and continued on QVAR . Says she got much better back to baseline . Noticed last 3 days with high temps and humidity breathing not doing as well, increased albuterol use.  No fever, cough ,or chest pain. No edema. Chest xray last visit with increased interstitial markings. CT chest 10/2018 showed chronic changes in RUL felt from previous PNA  And multiple 3 mm nodules.   10/23/2019 - interim hx Patient presents today with reports of side effects from Symbicort. Patient has history of mild intermittent asthma. She was seen for virtual visit yesterday by pulmonary nurse practitioner and started on Symbicort 160 two puffs twice daily due to increased symptom burden. Patient reports worsening breathing symptoms, dizziness, headache and burning sensation in her mouth after use.  She previously tolerated Qvar and albuterol as needed. Started Symbicort today. She took Symbicort this morning she reports medication landing on her tongue causing burning sensation.  She has AeroChamber which she uses with albuterol but did not use it today when using Symbicort for the first time.   She has several other small complaints today. She reports significant morning fatigue. States that she sleep very well at night. Reports migraine headaches which is already follows with [facility] for. She has  tried botox injections and Neurontin.  She had an MRI in June which showed bilateral nonspecific periventricular and subcortial white matter hyperintensities, mild incidental chronic paranasal sinus inflammation.  No significant change compared with previous MRI from July 2020. She has eye pain and is going to opthamologist tomorrow to see if she needs a new eye glass prescription. She also report leg cramping which wakes her up in the morning. She is taking magnesium supplement.    No Known Allergies  Immunization History  Administered Date(s) Administered  . Influenza,inj,Quad PF,6+ Mos 01/19/2017, 01/26/2019  . Moderna SARS-COVID-2 Vaccination 09/03/2019, 09/24/2019  . Pneumococcal Polysaccharide-23 12/25/2018  . Tdap 07/27/2012  . Zoster 09/26/2014  . Zoster Recombinat (Shingrix) 05/04/2018, 10/26/2018    Past Medical History:  Diagnosis Date  . Allergy   . Anxiety   . Depression   . Hyperlipidemia   . Hypertension   . Osteopenia after menopause   . Persistent headaches   . Pneumonia 2020  . Postherpetic neuralgia   . Renal disorder   . Shingles outbreak 02/2014  . Skin cancer    moles on right groin and right buttock.    Tobacco History: Social History   Tobacco Use  Smoking Status Never Smoker  Smokeless Tobacco Never Used   Counseling given: Not Answered   Outpatient Medications Prior to Visit  Medication Sig Dispense Refill  . albuterol (VENTOLIN HFA) 108 (90 Base) MCG/ACT inhaler TAKE 2 PUFFS BY MOUTH EVERY 6 HOURS AS NEEDED FOR WHEEZE OR SHORTNESS OF BREATH 18 g 2  . atorvastatin (LIPITOR) 20 MG tablet TAKE 1 TABLET BY MOUTH EVERY  DAY 90 tablet 2  . Calcium-Vitamin D-Vitamin K (CALCIUM + D + K PO) Take 3 each by mouth daily. Calcium 500mg - Vitamin D 1000iu- K 40mg      . clonazePAM (KLONOPIN) 0.5 MG tablet Take 0.5 mg by mouth daily as needed.    Marland Kitchen escitalopram (LEXAPRO) 10 MG tablet Take 10 mg by mouth daily.    Marland Kitchen gabapentin (NEURONTIN) 400 MG capsule Take 1  capsule (400 mg total) by mouth 2 (two) times daily. 60 capsule 12  . latanoprost (XALATAN) 0.005 % ophthalmic solution Place 1 drop into both eyes at bedtime. 2.5 mL 12  . montelukast (SINGULAIR) 10 MG tablet Take 10 mg by mouth daily.    . predniSONE (DELTASONE) 10 MG tablet 4 tabs for 2 days, then 3 tabs for 2 days, 2 tabs for 2 days, then 1 tab for 2 days, then stop 20 tablet 0  . QUEtiapine (SEROQUEL XR) 400 MG 24 hr tablet Take 1 tablet (400 mg total) by mouth at bedtime. 30 tablet 1  . temazepam (RESTORIL) 15 MG capsule Take 1 capsule (15 mg total) by mouth at bedtime. 30 capsule 0  . triamterene-hydrochlorothiazide (MAXZIDE-25) 37.5-25 MG tablet TAKE 1 TABLET BY MOUTH EVERY DAY 90 tablet 2  . beclomethasone (QVAR) 80 MCG/ACT inhaler Inhale 1-2 puffs into the lungs 2 (two) times daily. (Patient not taking: Reported on 10/23/2019) 10.6 g 1  . budesonide-formoterol (SYMBICORT) 160-4.5 MCG/ACT inhaler Inhale 2 puffs into the lungs in the morning and at bedtime. (Patient not taking: Reported on 10/23/2019) 1 Inhaler 5   No facility-administered medications prior to visit.      Review of Systems  Review of Systems  Constitutional: Positive for fatigue.  Respiratory: Positive for shortness of breath.   Musculoskeletal:       Leg cramps  Neurological: Positive for headaches.  Psychiatric/Behavioral: The patient is nervous/anxious.      Physical Exam  BP 128/80 (BP Location: Left Arm, Cuff Size: Normal)   Pulse 85   Temp (!) 97.5 F (36.4 C) (Oral)   Ht 5\' 1"  (1.549 m)   Wt 108 lb 3.2 oz (49.1 kg)   SpO2 100%   BMI 20.44 kg/m  Physical Exam Constitutional:      Appearance: Normal appearance.  HENT:     Head: Normocephalic and atraumatic.  Cardiovascular:     Rate and Rhythm: Normal rate and regular rhythm.  Pulmonary:     Effort: Pulmonary effort is normal.     Breath sounds: Normal breath sounds. No wheezing or rhonchi.     Comments: Lungs clear Skin:    General: Skin  is warm and dry.  Neurological:     General: No focal deficit present.     Mental Status: She is alert and oriented to person, place, and time. Mental status is at baseline.  Psychiatric:        Mood and Affect: Mood normal.        Behavior: Behavior normal.        Thought Content: Thought content normal.        Judgment: Judgment normal.     Comments: Somewhat anxious      Lab Results:  CBC    Component Value Date/Time   WBC 4.9 09/05/2019 1143   RBC 4.16 09/05/2019 1143   HGB 12.9 09/05/2019 1143   HCT 37.9 09/05/2019 1143   PLT 339.0 09/05/2019 1143   MCV 90.9 09/05/2019 1143   MCH 30.7 01/31/2019 0936   MCHC  33.9 09/05/2019 1143   RDW 13.1 09/05/2019 1143   LYMPHSABS 0.7 09/05/2019 1143   MONOABS 0.6 09/05/2019 1143   EOSABS 0.2 09/05/2019 1143   BASOSABS 0.0 09/05/2019 1143    BMET    Component Value Date/Time   NA 132 (L) 09/05/2019 1143   K 4.6 09/05/2019 1143   CL 95 (L) 09/05/2019 1143   CO2 30 09/05/2019 1143   GLUCOSE 105 (H) 09/05/2019 1143   BUN 10 09/05/2019 1143   CREATININE 0.57 09/05/2019 1143   CREATININE 0.90 06/08/2017 1534   CALCIUM 9.6 09/05/2019 1143   GFRNONAA >60 02/11/2019 0702   GFRAA >60 02/11/2019 0702    BNP No results found for: BNP  ProBNP No results found for: PROBNP  Imaging: DG Chest 2 View  Result Date: 09/24/2019 CLINICAL DATA:  Asthma exacerbation, shortness of breath EXAM: CHEST - 2 VIEW COMPARISON:  09/29/2018 FINDINGS: Frontal and lateral views of the chest demonstrate an unremarkable cardiac silhouette. Diffuse interstitial scarring is noted, without airspace disease, effusion, or pneumothorax. The lungs are normally inflated. No acute bony abnormalities. IMPRESSION: 1. Diffuse interstitial scarring consistent with given history of asthma. 2. No acute airspace disease. Electronically Signed   By: Randa Ngo M.D.   On: 09/24/2019 22:09     Assessment & Plan:   Asthma - Started on Symbicort 160 yesterday d/t  increased symptom burden - Patient reports burning sensation on tongue after first use of Symbicort today - Recommend decreasing Symbicort 160 to one puff twice daily and using AeroChamber with inhaler - Patient needs follow-up with Dr. Mortimer Fries in 4 to 6 weeks with PFTs  Leg cramps - Checking basic labs - Recommend patient follow-up with PCP  Fatigue - Patient reports morning fatigue and headaches - She is established with [facility] neurology, recommend she follow-up with them to discuss possible sleep study to r/o sleep apnea as cause of fatigue. Other potential causes could be allergies or chronic sinusitis which was seen on MRI Brain in June    Martyn Ehrich, NP 10/23/2019

## 2019-10-24 LAB — BASIC METABOLIC PANEL
BUN: 20 mg/dL (ref 6–23)
CO2: 34 mEq/L — ABNORMAL HIGH (ref 19–32)
Calcium: 9 mg/dL (ref 8.4–10.5)
Chloride: 100 mEq/L (ref 96–112)
Creatinine, Ser: 0.75 mg/dL (ref 0.40–1.20)
GFR: 77.6 mL/min (ref >60.00)
Glucose, Bld: 87 mg/dL (ref 70–99)
Potassium: 4.4 mEq/L (ref 3.5–5.1)
Sodium: 136 mEq/L (ref 135–145)

## 2019-10-24 LAB — MAGNESIUM: Magnesium: 1.9 mg/dL (ref 1.5–2.5)

## 2019-10-24 LAB — IGE: IgE (Immunoglobulin E), Serum: 22 kU/L (ref <=114)

## 2019-10-24 NOTE — Progress Notes (Signed)
Please let patient know Magnesium and potassium level were normal.

## 2019-10-25 ENCOUNTER — Other Ambulatory Visit: Payer: Self-pay | Admitting: Family Medicine

## 2019-11-05 ENCOUNTER — Telehealth: Payer: Self-pay | Admitting: Internal Medicine

## 2019-11-05 DIAGNOSIS — R918 Other nonspecific abnormal finding of lung field: Secondary | ICD-10-CM

## 2019-11-05 DIAGNOSIS — G4719 Other hypersomnia: Secondary | ICD-10-CM

## 2019-11-05 NOTE — Telephone Encounter (Signed)
Called and spoke to patient.  Patient stated that she would like to proceed with CT and sleep study as discussed with Tammy Parrett at last office visit.  I do not see that CT nor sleep study was ordered at last office visit.  Pt feels that breathing has improved since starting symbicort, however she is still experiencing sob and fatigue.  She has had to use albuterol less, however she did use it today due to humidity.   Patient would like Dr. Zoila Shutter opinion on CT and sleep study.  She also stated that she has not been contacted to schedule PFT.  Suanne Marker, can you help with this?  Dr. Mortimer Fries, please advise. Thanks

## 2019-11-06 ENCOUNTER — Other Ambulatory Visit: Payer: Self-pay

## 2019-11-06 DIAGNOSIS — J4520 Mild intermittent asthma, uncomplicated: Secondary | ICD-10-CM

## 2019-11-06 NOTE — Telephone Encounter (Signed)
Patient is aware that Dr. Mortimer Fries is currently out of the office. Patient is okay with waiting for response.

## 2019-11-07 NOTE — Telephone Encounter (Signed)
Called patient to schedule PFT. Pt was inquiring as to what dates and times I had to schedule test and I advised patient that I would have to call Cardiopulmonary to schedule and that I couldn't look at their schedule to advise what was open.  Since patient has dates and times that she can go and not go, I gave patient the phone number to Cardiopulmonary to schedule PFT [phone].  Once scheduled, I will place COVID test and mail instructions to patient. Rhonda J Cobb

## 2019-11-09 ENCOUNTER — Encounter: Payer: Self-pay | Admitting: Family Medicine

## 2019-11-09 ENCOUNTER — Telehealth: Payer: Self-pay

## 2019-11-09 NOTE — Telephone Encounter (Signed)
Patient is aware of date/time of covid test prior to PFT.  

## 2019-11-09 NOTE — Telephone Encounter (Signed)
Pt scheduled PFT for Tues 11/13/2019 at 1:15 at [facility] and check in at the Admission/Registration Desk, arrival time 1:00.  COVID Test to on Monday 11/12/2019 at the [facility] between 8:00 am to 1:00 pm.  PFT and COVID information mailed to patient on 11/09/2019.  Nothing else needed at this time. Rhonda J Cobb

## 2019-11-12 ENCOUNTER — Other Ambulatory Visit: Payer: Self-pay

## 2019-11-12 ENCOUNTER — Other Ambulatory Visit
Admission: RE | Admit: 2019-11-12 | Discharge: 2019-11-12 | Disposition: A | Discharge status: Home / Self Care | Payer: Medicare Other | Source: Ambulatory Visit | Attending: Primary Care | Admitting: Primary Care

## 2019-11-12 DIAGNOSIS — Z01812 Encounter for preprocedural laboratory examination: Secondary | ICD-10-CM | POA: Diagnosis present

## 2019-11-12 DIAGNOSIS — Z20822 Contact with and (suspected) exposure to covid-19: Secondary | ICD-10-CM | POA: Insufficient documentation

## 2019-11-12 LAB — SARS CORONAVIRUS 2 (TAT 6-24 HRS): SARS Coronavirus 2: NEGATIVE

## 2019-11-13 ENCOUNTER — Telehealth: Payer: Self-pay

## 2019-11-13 ENCOUNTER — Ambulatory Visit: Payer: Medicare Other | Attending: Primary Care

## 2019-11-13 DIAGNOSIS — J4520 Mild intermittent asthma, uncomplicated: Secondary | ICD-10-CM

## 2019-11-13 LAB — PULMONARY FUNCTION TEST ARMC ONLY
DL/VA % pred: 127 %
DL/VA: 5.47 ml/min/mmHg/L
DLCO unc % pred: 104 %
DLCO unc: 18.67 ml/min/mmHg
FEF 25-75 Post: 1.24 L/sec
FEF 25-75 Pre: 2.06 L/sec
FEF2575-%Change-Post: -39 %
FEF2575-%Pred-Post: 61 %
FEF2575-%Pred-Pre: 102 %
FEV1-%Change-Post: -11 %
FEV1-%Pred-Post: 80 %
FEV1-%Pred-Pre: 91 %
FEV1-Post: 1.75 L
FEV1-Pre: 1.98 L
FEV1FVC-%Change-Post: -10 %
FEV1FVC-%Pred-Pre: 106 %
FEV6-%Change-Post: -1 %
FEV6-%Pred-Post: 86 %
FEV6-%Pred-Pre: 88 %
FEV6-Post: 2.37 L
FEV6-Pre: 2.4 L
FEV6FVC-%Pred-Post: 104 %
FEV6FVC-%Pred-Pre: 104 %
FVC-%Change-Post: -1 %
FVC-%Pred-Post: 83 %
FVC-%Pred-Pre: 84 %
FVC-Post: 2.37 L
Post FEV1/FVC ratio: 74 %
Post FEV6/FVC ratio: 100 %
Pre FEV1/FVC ratio: 82 %
Pre FEV6/FVC Ratio: 100 %
RV % pred: 81 %
RV: 1.57 L
TLC % pred: 85 %
TLC: 3.95 L

## 2019-11-13 NOTE — Telephone Encounter (Signed)
Dr. Mortimer Fries please advise on CT and sleep study. Thanks

## 2019-11-13 NOTE — Telephone Encounter (Signed)
Patient is inquiring about a bill she received for her MRI that Dr. Leta Baptist ordered. The bill was from [facility] in [place]. She said that she's had MRIs done before but never received a bill like this. I advised her that I would send a message back but that she should contact [facility] as well and ask them about it. She said she would.

## 2019-11-13 NOTE — Progress Notes (Signed)
Patient no showed for video visit with tammy. Needs to reschedule to go over PFTs. Patient of Dr. Mortimer Fries

## 2019-11-13 NOTE — Telephone Encounter (Signed)
Dr. Kasa, please advise. Thanks °

## 2019-11-16 NOTE — Progress Notes (Signed)
She is low risk for lung cancer as she is a non-smoker. CT 1 year ago was not concerning. Her diffusion capacity is normal on PFTs. I do not recommend future CT imaging at this time. She can follow-up with Dr. Mortimer Fries or Patsey Berthold to discuss.

## 2019-11-16 NOTE — Progress Notes (Signed)
Please let patient know her PFTs were normal. No obvious evidence of Obstructive Airways disease or Restrictive Lung disease. Diffusion capacity was normal.

## 2019-11-19 NOTE — Telephone Encounter (Signed)
Ct without contrast and Split night would be [facility]

## 2019-11-19 NOTE — Addendum Note (Signed)
Addended by: Claudette Head A on: 11/19/2019 03:00 PM   Modules accepted: Orders

## 2019-11-19 NOTE — Telephone Encounter (Signed)
CT and split night have been ordered.  Patient is aware and voiced her understanding.  Nothing further is needed at this time.

## 2019-11-19 NOTE — Telephone Encounter (Signed)
Patient is aware of below recommendations and voiced her understanding.  Dr. Mortimer Fries, please advise on CT. Would like with or without contrast. And split night?

## 2019-11-19 NOTE — Telephone Encounter (Signed)
Please obtain CT chest Dx SOB/lung nodule and Sleep STudy Dx OSA

## 2019-11-19 NOTE — Telephone Encounter (Signed)
Dr. Kasa, please see below message and advise. thanks 

## 2019-11-20 ENCOUNTER — Telehealth: Payer: Self-pay | Admitting: Internal Medicine

## 2019-11-20 NOTE — Telephone Encounter (Signed)
Called and spoke to patient. Patient stated that it was mentioned by Tammy parrett that she would need a more dept CT, however a CT without contrast was ordered, which she had last year. She is questioning if she should have a different type of CT.   Dr. Mortimer Fries, please advise? Thanks

## 2019-11-21 NOTE — Telephone Encounter (Signed)
Patient is aware of below message and voiced her understanding.  ?Nothing further is needed.  ? ?

## 2019-11-21 NOTE — Telephone Encounter (Signed)
No same CT as last year

## 2019-12-28 ENCOUNTER — Other Ambulatory Visit: Payer: Self-pay | Admitting: Family Medicine

## 2020-01-02 ENCOUNTER — Other Ambulatory Visit: Payer: Self-pay | Admitting: Family Medicine

## 2020-01-15 ENCOUNTER — Telehealth: Payer: Self-pay | Admitting: Internal Medicine

## 2020-01-15 NOTE — Telephone Encounter (Signed)
Called and spoke to patient, who stated that she received notification in mychart that she is due to PNA vaccine.  Patient had pneumo 23 on 12/25/2018.  Dr. Mortimer Fries, do you recommend prevnar 79?

## 2020-01-17 NOTE — Telephone Encounter (Signed)
Yes, usually can be taken 2 months apart

## 2020-01-17 NOTE — Telephone Encounter (Signed)
Lm for patient.  

## 2020-01-17 NOTE — Telephone Encounter (Signed)
Patient is aware of below recommendations and voiced her understanding.  Nothing further needed.   

## 2020-01-17 NOTE — Telephone Encounter (Signed)
Dr. Kasa, please advise. Thanks °

## 2020-02-01 DIAGNOSIS — G894 Chronic pain syndrome: Secondary | ICD-10-CM | POA: Diagnosis not present

## 2020-02-01 DIAGNOSIS — M546 Pain in thoracic spine: Secondary | ICD-10-CM | POA: Diagnosis not present

## 2020-02-01 DIAGNOSIS — M9908 Segmental and somatic dysfunction of rib cage: Secondary | ICD-10-CM | POA: Diagnosis not present

## 2020-02-01 DIAGNOSIS — M542 Cervicalgia: Secondary | ICD-10-CM | POA: Diagnosis not present

## 2020-02-01 DIAGNOSIS — M9901 Segmental and somatic dysfunction of cervical region: Secondary | ICD-10-CM | POA: Diagnosis not present

## 2020-02-01 DIAGNOSIS — M25461 Effusion, right knee: Secondary | ICD-10-CM | POA: Diagnosis not present

## 2020-02-01 DIAGNOSIS — M9903 Segmental and somatic dysfunction of lumbar region: Secondary | ICD-10-CM | POA: Diagnosis not present

## 2020-02-01 DIAGNOSIS — M9902 Segmental and somatic dysfunction of thoracic region: Secondary | ICD-10-CM | POA: Diagnosis not present

## 2020-02-01 DIAGNOSIS — M9904 Segmental and somatic dysfunction of sacral region: Secondary | ICD-10-CM | POA: Diagnosis not present

## 2020-02-01 DIAGNOSIS — M9905 Segmental and somatic dysfunction of pelvic region: Secondary | ICD-10-CM | POA: Diagnosis not present

## 2020-02-01 DIAGNOSIS — M25511 Pain in right shoulder: Secondary | ICD-10-CM | POA: Diagnosis not present

## 2020-02-18 ENCOUNTER — Telehealth: Payer: Medicare Other | Admitting: Primary Care

## 2020-02-18 ENCOUNTER — Ambulatory Visit (INDEPENDENT_AMBULATORY_CARE_PROVIDER_SITE_OTHER): Payer: Medicare Other

## 2020-02-18 ENCOUNTER — Telehealth: Payer: Self-pay | Admitting: Family Medicine

## 2020-02-18 ENCOUNTER — Ambulatory Visit
Admission: EM | Admit: 2020-02-18 | Discharge: 2020-02-18 | Disposition: A | Discharge status: Home / Self Care | Payer: Medicare Other | Attending: Family Medicine | Admitting: Family Medicine

## 2020-02-18 ENCOUNTER — Telehealth: Payer: Self-pay | Admitting: Internal Medicine

## 2020-02-18 ENCOUNTER — Encounter: Payer: Self-pay | Admitting: Emergency Medicine

## 2020-02-18 ENCOUNTER — Other Ambulatory Visit: Payer: Self-pay

## 2020-02-18 DIAGNOSIS — R918 Other nonspecific abnormal finding of lung field: Secondary | ICD-10-CM

## 2020-02-18 DIAGNOSIS — R509 Fever, unspecified: Secondary | ICD-10-CM | POA: Diagnosis not present

## 2020-02-18 DIAGNOSIS — M546 Pain in thoracic spine: Secondary | ICD-10-CM | POA: Diagnosis not present

## 2020-02-18 DIAGNOSIS — R5383 Other fatigue: Secondary | ICD-10-CM

## 2020-02-18 DIAGNOSIS — R059 Cough, unspecified: Secondary | ICD-10-CM | POA: Diagnosis not present

## 2020-02-18 DIAGNOSIS — R0789 Other chest pain: Secondary | ICD-10-CM

## 2020-02-18 DIAGNOSIS — Z8701 Personal history of pneumonia (recurrent): Secondary | ICD-10-CM

## 2020-02-18 NOTE — Telephone Encounter (Signed)
Pt called in asking if we can do an in office visit, she states that she had a fever yesterday, no cough, just hoarseness. She states that she normally get pneumonia and wanted Dr. Birdie Riddle to listen to her lungs.   Please advise if you are ok with an in office visit.   Pt can be reached at the home #

## 2020-02-18 NOTE — Discharge Instructions (Addendum)
If you cannot get CT ordered today by PCP or pulmonology  Recommend that you go to the ER for further evaluation and treatment

## 2020-02-18 NOTE — Telephone Encounter (Signed)
Patient called the office stating she went to urgent care and they did a chest xray and told her they recommend a CT chest with intravenous contrast for further evaluation and needing you to order it. Please advise.

## 2020-02-18 NOTE — Telephone Encounter (Signed)
Called and spoke to patient.  Patient stated that she was seen at [facility] today. CXR was ordered and showed some abnormalities and CT was recommended. She would like CT to be ordered.   [name], please advise. Dr. Mortimer Fries is unavailable.

## 2020-02-18 NOTE — Telephone Encounter (Signed)
Per [name]--recommend mychart visit tomorrow.  appt scheduled for 02/19/2020 at 2:00. Patient is aware and voiced her understanding. Nothing further needed.

## 2020-02-18 NOTE — Telephone Encounter (Signed)
Pt called in stating that she has went to the urgent care they did a chest xray and told her they recommend CT chest with intravenous contrast for further evaluation. They need Dr. Birdie Riddle to order this.   Please advise

## 2020-02-18 NOTE — Telephone Encounter (Signed)
Schedule televisit with me tomorrow

## 2020-02-18 NOTE — Telephone Encounter (Signed)
Since pt has respiratory sxs and a fever, this has to be a virtual visit

## 2020-02-18 NOTE — Telephone Encounter (Signed)
Pt has seen Pulmonary (Dr Mortimer Fries) and he ordered this for her in August.  She never scheduled it as recommended.  She can contact their office and decide how to proceed

## 2020-02-18 NOTE — ED Triage Notes (Addendum)
Patient c/o chest pain and back pain that started x 2 days "this occurs when I breath deep or reach for something".   Patient stated she had a fever at home, temp of 100.3 F.   Patient states "this feels like when I had pneumonia before".   Patient has used an inhaler at home with some relief of symptoms.   History of asthma and pneumonia.

## 2020-02-18 NOTE — ED Provider Notes (Signed)
[place]   [id number] 02/18/20 Arrival Time: 3491   CC: COVID symptoms  SUBJECTIVE: History from: patient.  Theresa Martin is a 64 y.o. female who presents with abrupt onset of fatigue, right sided chest pain that radiates to the back for the last 2 days. States that the pain is worse when she is moving or deep breathing. Has hx pneumonia. Has been around grandchildren that have also been sick. Denies sick exposure to COVID, flu or strep. Denies recent travel. Has negative history of Covid. Has completed Covid vaccines. Has used her inhalers for this at home. Denies previous symptoms in the past. Denies fever, chills, sinus pain, rhinorrhea, sore throat, SOB, wheezing, nausea, changes in bowel or bladder habits.    ROS: As per HPI.  All other pertinent ROS negative.     Past Medical History:  Diagnosis Date  . Allergy   . Anxiety   . Depression   . Hyperlipidemia   . Hypertension   . Osteopenia after menopause   . Persistent headaches   . Pneumonia 2020  . Postherpetic neuralgia   . Renal disorder   . Shingles outbreak 02/2014  . Skin cancer    moles on right groin and right buttock.   Past Surgical History:  Procedure Laterality Date  . CARDIOVASCULAR STRESS TEST  02/2009   treadmill stress test: Low risk  . [place]  . COLONOSCOPY     2014/2015 [place], normal  . EXPLORATORY LAPAROTOMY     laproscopy for infertility  . EYE SURGERY Bilateral    laser-correct opening b/tn cornea and iris  . MOLE REMOVAL  2017   x 2 moles   No Known Allergies No current facility-administered medications on file prior to encounter.   Current Outpatient Medications on File Prior to Encounter  Medication Sig Dispense Refill  . albuterol (VENTOLIN HFA) 108 (90 Base) MCG/ACT inhaler TAKE 2 PUFFS BY MOUTH EVERY 6 HOURS AS NEEDED FOR WHEEZE OR SHORTNESS OF BREATH 18 g 2  . atorvastatin (LIPITOR) 20 MG tablet TAKE 1 TABLET BY MOUTH EVERY DAY 90  tablet 2  . beclomethasone (QVAR) 80 MCG/ACT inhaler Inhale 1-2 puffs into the lungs 2 (two) times daily. 10.6 g 1  . budesonide-formoterol (SYMBICORT) 160-4.5 MCG/ACT inhaler Inhale 2 puffs into the lungs in the morning and at bedtime. 1 Inhaler 5  . Calcium-Vitamin D-Vitamin K (CALCIUM + D + K PO) Take 3 each by mouth daily. Calcium 500mg - Vitamin D 1000iu- K 40mg      . escitalopram (LEXAPRO) 10 MG tablet Take 10 mg by mouth daily.    Marland Kitchen gabapentin (NEURONTIN) 400 MG capsule Take 1 capsule (400 mg total) by mouth 2 (two) times daily. 60 capsule 12  . latanoprost (XALATAN) 0.005 % ophthalmic solution Place 1 drop into both eyes at bedtime. 2.5 mL 12  . montelukast (SINGULAIR) 10 MG tablet TAKE 1 TABLET BY MOUTH EVERYDAY AT BEDTIME 90 tablet 1  . QUEtiapine (SEROQUEL XR) 400 MG 24 hr tablet Take 1 tablet (400 mg total) by mouth at bedtime. 30 tablet 1  . temazepam (RESTORIL) 15 MG capsule Take 1 capsule (15 mg total) by mouth at bedtime. 30 capsule 0  . triamterene-hydrochlorothiazide (MAXZIDE-25) 37.5-25 MG tablet TAKE 1 TABLET BY MOUTH EVERY DAY 30 tablet 8  . clonazePAM (KLONOPIN) 0.5 MG tablet Take 0.5 mg by mouth daily as needed.    . predniSONE (DELTASONE) 10 MG tablet 4 tabs for 2 days, then 3 tabs  for 2 days, 2 tabs for 2 days, then 1 tab for 2 days, then stop 20 tablet 0   Social History   Socioeconomic History  . Marital status: Legally Separated    Spouse name: Not on file  . Number of children: 2  . Years of education: Not on file  . Highest education level: Not on file  Occupational History  . Occupation: Control and instrumentation engineer  Tobacco Use  . Smoking status: Never Smoker  . Smokeless tobacco: Never Used  Vaping Use  . Vaping Use: Never used  Substance and Sexual Activity  . Alcohol use: No    Alcohol/week: 0.0 standard drinks  . Drug use: No  . Sexual activity: Not on file  Other Topics Concern  . Not on file  Social History Narrative   Regular exercise- yes, spins daily    Diet: fruits and veggies, water    No caffeine   Lives alone    Social Determinants of Health   Financial Resource Strain:   . Difficulty of Paying Living Expenses: Not on file  Food Insecurity:   . Worried About Charity fundraiser in the Last Year: Not on file  . Ran Out of Food in the Last Year: Not on file  Transportation Needs:   . Lack of Transportation (Medical): Not on file  . Lack of Transportation (Non-Medical): Not on file  Physical Activity:   . Days of Exercise per Week: Not on file  . Minutes of Exercise per Session: Not on file  Stress:   . Feeling of Stress : Not on file  Social Connections:   . Frequency of Communication with Friends and Family: Not on file  . Frequency of Social Gatherings with Friends and Family: Not on file  . Attends Religious Services: Not on file  . Active Member of Clubs or Organizations: Not on file  . Attends Archivist Meetings: Not on file  . Marital Status: Not on file  Intimate Partner Violence:   . Fear of Current or Ex-Partner: Not on file  . Emotionally Abused: Not on file  . Physically Abused: Not on file  . Sexually Abused: Not on file   Family History  Problem Relation Age of Onset  . Hypertension Mother   . Coronary artery disease Mother   . Heart failure Mother   . Hypertension Father   . Liver disease Sister        liver failure  . Arrhythmia Brother   . Hypertension Brother   . Cancer Neg Hx        colon   . Breast cancer Neg Hx     OBJECTIVE:  Vitals:   02/18/20 1414  BP: 113/70  Pulse: 95  Resp: 17  Temp: 100 F (37.8 C)  TempSrc: Oral  SpO2: 100%     General appearance: alert; appears fatigued, but nontoxic; speaking in full sentences and tolerating own secretions HEENT: NCAT; Ears: EACs clear, TMs pearly gray; Eyes: PERRL.  EOM grossly intact. Sinuses: nontender; Nose: nares patent without rhinorrhea, Throat: oropharynx clear, tonsils non erythematous or enlarged, uvula midline   Neck: supple without LAD Lungs: unlabored respirations, symmetrical air entry; cough: absent; no respiratory distress; coarse lung sounds to R upper lobe, otherwise CTA Heart: regular rate and rhythm. Radial pulses 2+ symmetrical bilaterally Skin: warm and dry Psychological: alert and cooperative; normal mood and affect  LABS:  No results found for this or any previous visit (from the past 24 hour(s)).  ASSESSMENT & PLAN:  1. Opacity of lung on imaging study   2. Fever, unspecified   3. Other chest pain   4. Acute right-sided thoracic back pain   5. Other fatigue   6. History of pneumonia    Chest xray shows: "Narrative & Impression CLINICAL DATA:  Cough, history of pneumonia.  EXAM: CHEST - 2 VIEW  COMPARISON:  Chest x-ray 09/24/2019.  FINDINGS: The heart size and mediastinal contours are within normal limits.  Biapical pleural/pulmonary scarring. Retrosternal opacity on lateral view of unclear etiology. No pulmonary edema. No pleural effusion. No pneumothorax.  No acute osseous abnormality.  IMPRESSION: Retrosternal opacity on lateral view of unclear etiology. Finding could represent artifact versus real finding. Recommend CT chest with intravenous contrast for further evaluation.  These results will be called to the ordering clinician or representative by the Radiologist Assistant, and communication documented in the PACS or [employer].   Electronically Signed   By: Iven Finn M.D.   On: 02/18/2020 15:06  EKG reassuring today, no change from previous, NSR Recommended that she go to the ER for chest CT scan She declines at this time. Discussed with patient that she may be able to get her PCP to order a CT today, or her pulmonologist. She called PCP in office and they cannot get this until tomorrow She also called her pulmonologist and is awaiting a call back from them Discussed that if SOB developed or if chest pain worsened, she  needed to go immediately to the ER for further evaluation and treatment. Verbalized understanding and is in agreement with treatment plan       Faustino Congress, NP 02/19/20 [phone]

## 2020-02-19 ENCOUNTER — Encounter: Payer: Self-pay | Admitting: Family Medicine

## 2020-02-19 ENCOUNTER — Telehealth (INDEPENDENT_AMBULATORY_CARE_PROVIDER_SITE_OTHER): Payer: Medicare Other | Admitting: Primary Care

## 2020-02-19 ENCOUNTER — Encounter: Payer: Self-pay | Admitting: Primary Care

## 2020-02-19 ENCOUNTER — Telehealth (INDEPENDENT_AMBULATORY_CARE_PROVIDER_SITE_OTHER): Payer: Medicare Other | Admitting: Family Medicine

## 2020-02-19 DIAGNOSIS — R918 Other nonspecific abnormal finding of lung field: Secondary | ICD-10-CM | POA: Diagnosis not present

## 2020-02-19 DIAGNOSIS — R0781 Pleurodynia: Secondary | ICD-10-CM | POA: Diagnosis not present

## 2020-02-19 DIAGNOSIS — R0789 Other chest pain: Secondary | ICD-10-CM

## 2020-02-19 DIAGNOSIS — R509 Fever, unspecified: Secondary | ICD-10-CM | POA: Diagnosis not present

## 2020-02-19 DIAGNOSIS — J069 Acute upper respiratory infection, unspecified: Secondary | ICD-10-CM | POA: Diagnosis not present

## 2020-02-19 NOTE — Progress Notes (Addendum)
Virtual Visit via Video Note  I connected with Theresa Martin on 02/19/20 at  2:00 PM EST by a video enabled telemedicine application and verified that I am speaking with the correct person using two identifiers.  Location: Patient: Home Provider: Office   I discussed the limitations of evaluation and management by telemedicine and the availability of in person appointments. The patient expressed understanding and agreed to proceed.  History of Present Illness: 64 yo female, never smoked. PMH significant for asthma, allergic rhinitis, GERD, hypertension, migraine without aura, osteopenia.  Patient of Dr. Mortimer Fries, last seen by [facility] yesterday for virtual video visit.  Previous [facility] pulmonary encounters: 10/22/19- Rexene Edison, NP Today's video visit is a follow up for Asthma. Was seen 1 month ago with Asthma flare , treated with steroid taper and continued on QVAR . Says she got much better back to baseline . Noticed last 3 days with high temps and humidity breathing not doing as well, increased albuterol use.  No fever, cough ,or chest pain. No edema. Chest xray last visit with increased interstitial markings. CT chest 10/2018 showed chronic changes in RUL felt from previous PNA  And multiple 3 mm nodules.   10/23/2019  Patient presents today with reports of side effects from Symbicort. Patient has history of mild intermittent asthma. She was seen for virtual visit yesterday by pulmonary nurse practitioner and started on Symbicort 160 two puffs twice daily due to increased symptom burden. Patient reports worsening breathing symptoms, dizziness, headache and burning sensation in her mouth after use.  She previously tolerated Qvar and albuterol as needed. Started Symbicort today. She took Symbicort this morning she reports medication landing on her tongue causing burning sensation.  She has AeroChamber which she uses with albuterol but did not use it today when using Symbicort for the first  time.   She has several other small complaints today. She reports significant morning fatigue. States that she sleep very well at night. Reports migraine headaches which is already follows with [facility] for. She has tried botox injections and Neurontin.  She had an MRI in June which showed bilateral nonspecific periventricular and subcortial white matter hyperintensities, mild incidental chronic paranasal sinus inflammation.  No significant change compared with previous MRI from July 2020. She has eye pain and is going to opthamologist tomorrow to see if she needs a new eye glass prescription. She also report leg cramping which wakes her up in the morning. She is taking magnesium supplement.    02/19/2020- Interim hx  She was seen at [facility] yesterday for voice hoarseness, right sided chest pain and fever. Tmax 100. She was around her grandchildren last week who are currently sick. She is compliant with Symbicort twice daily. She had a CXR 02/18/20 that showed biapical pleural/pulmonary scarring and retrosternal opacity on lateral view, unclear etiology. Recommended CT chest with contrast for further evaluation. Her last CT chest in August 2020 showed multiple 65mm pulmonary nodules, chronic changes lower aspect right upper lobe. She was due for repeat CT chest in August 2021 to follow-up on lung nodule but appears this was never scheduled. She denies any shortness of breath, wheezing, chest tightness, cough, sinus congestion, N/V/D or dysuria. She is up to date with covid vaccination.   Observations/Objective:  - She appears well; no obvious shortness of breath, wheezing or cough - Able to speak in full sentences  Assessment and Plan:  Pleuritic chest pain: - Seen in UC 02/18/20 for fever, voice hoarsenss and RIGHT  sided pleuritic chest pain. CXR showed retrosternal opacity on lateral view. Ordering D-dimer and CTA to rule out PE or underlying acute process  Fever of unknown origin: - Likely  viral, she does have hx pneumonia - Denies shortness of breath, cough, congestion, dysuria, N/V/D   Follow Up Instructions:  - After imaging results/as needed if symptoms worsen   I discussed the assessment and treatment plan with the patient. The patient was provided an opportunity to ask questions and all were answered. The patient agreed with the plan and demonstrated an understanding of the instructions.   The patient was advised to call back or seek an in-person evaluation if the symptoms worsen or if the condition fails to improve as anticipated.  I provided 20 minutes of non-face-to-face time during this encounter.   Martyn Ehrich, NP

## 2020-02-19 NOTE — Addendum Note (Signed)
Addended by: Claudette Head A on: 02/19/2020 02:21 PM   Modules accepted: Orders

## 2020-02-19 NOTE — Patient Instructions (Addendum)
D-dimer and CTA re: pleuritic chest pain   Follow up after results, if evidence of pneumonia will send in antibiotic. Otherwise, fever and voice hoarseness likely viral. May want to see PCP for throat culture if persists.

## 2020-02-19 NOTE — Addendum Note (Signed)
Addended by: Martyn Ehrich on: 02/19/2020 04:52 PM   Modules accepted: Orders

## 2020-02-19 NOTE — Progress Notes (Signed)
I connected with  Theresa Martin on 02/19/20 by a video enabled telemedicine application and verified that I am speaking with the correct person using two identifiers.   I discussed the limitations of evaluation and management by telemedicine. The patient expressed understanding and agreed to proceed.

## 2020-02-19 NOTE — Progress Notes (Signed)
Virtual Visit via Video   I connected with patient on 02/19/20 at 11:30 AM EST by a video enabled telemedicine application and verified that I am speaking with the correct person using two identifiers.  Location patient: Home Location provider: [facility], Office Persons participating in the virtual visit: Patient, Provider, [place] (Sabrina M)  I discussed the limitations of evaluation and management by telemedicine and the availability of in person appointments. The patient expressed understanding and agreed to proceed.  Subjective:   HPI:   URI- Pt was seen in UC yesterday and has an appt w/ pulmonary later today.  Sxs started last week.  + body aches Monday/Tuesday, developed low grade fever on Wednesday.  Then developed chest pain on R side- pain w/ deep breath.  Went to [facility] in [place]- had EKG (WNL) and CXR.  CXR showed retrosternal opacity on lateral view and CT chest was recommended.  Pt has low grade fevers as the day goes on.  Denies cough, congestion.  ROS:   See pertinent positives and negatives per HPI.  Patient Active Problem List   Diagnosis Date Noted   Leg cramps 10/23/2019   Fatigue 10/23/2019   GERD (gastroesophageal reflux disease) 02/28/2019   Schizo affective schizophrenia ([place]) 02/01/2019   Abnormal chest CT 12/11/2018   Left sided numbness 10/22/2018   Nonallopathic lesion of thoracic region 09/05/2018   Nonallopathic lesion of rib cage 09/05/2018   Nonallopathic lesion of cervical region 09/05/2018   Nonallopathic lesion of lumbosacral region 09/05/2018   Nonallopathic lesion of sacral region 09/05/2018   Skin cancer    Osteopenia after menopause    Chronic right shoulder pain 03/30/2017   Right shoulder pain 01/12/2017   Tendinopathy of rotator cuff 12/20/2016   Angle-closure glaucoma, severe stage 08/19/2015   Osteopenia 06/19/2015   Migraine without aura and without status migrainosus, not intractable 01/13/2015    High risk medications (not anticoagulants) long-term use 09/26/2014   Routine general medical examination at a health care facility 07/27/2012   Hyperlipidemia 09/29/2010   URETEROPELVIC JUNCTION OBSTRUCTION, CONGENITAL 12/15/2009   CONSTIPATION 12/11/2009   CARDIAC MURMUR 11/26/2008   MAGNETIC RESONANCE IMAGING, BRAIN, ABNORMAL 10/29/2008   VENEREAL WART 10/23/2008   Major depressive disorder, recurrent episode ([place]) 10/23/2008   Essential hypertension 10/23/2008   ALLERGIC RHINITIS 10/23/2008   Asthma 10/23/2008   RENAL CALCULUS, HX OF 10/23/2008    Social History   Tobacco Use   Smoking status: Never Smoker   Smokeless tobacco: Never Used  Substance Use Topics   Alcohol use: No    Alcohol/week: 0.0 standard drinks    Current Outpatient Medications:    albuterol (VENTOLIN HFA) 108 (90 Base) MCG/ACT inhaler, TAKE 2 PUFFS BY MOUTH EVERY 6 HOURS AS NEEDED FOR WHEEZE OR SHORTNESS OF BREATH, Disp: 18 g, Rfl: 2   atorvastatin (LIPITOR) 20 MG tablet, TAKE 1 TABLET BY MOUTH EVERY DAY, Disp: 90 tablet, Rfl: 2   budesonide-formoterol (SYMBICORT) 160-4.5 MCG/ACT inhaler, Inhale 2 puffs into the lungs in the morning and at bedtime., Disp: 1 Inhaler, Rfl: 5   Calcium-Vitamin D-Vitamin K (CALCIUM + D + K PO), Take 3 each by mouth daily. Calcium 500mg - Vitamin D 1000iu- K 40mg  , Disp: , Rfl:    clonazePAM (KLONOPIN) 0.5 MG tablet, Take 0.5 mg by mouth daily as needed., Disp: , Rfl:    escitalopram (LEXAPRO) 10 MG tablet, Take 10 mg by mouth daily., Disp: , Rfl:    gabapentin (NEURONTIN) 400 MG capsule, Take 1  capsule (400 mg total) by mouth 2 (two) times daily., Disp: 60 capsule, Rfl: 12   latanoprost (XALATAN) 0.005 % ophthalmic solution, Place 1 drop into both eyes at bedtime., Disp: 2.5 mL, Rfl: 12   montelukast (SINGULAIR) 10 MG tablet, TAKE 1 TABLET BY MOUTH EVERYDAY AT BEDTIME, Disp: 90 tablet, Rfl: 1   QUEtiapine (SEROQUEL XR) 400 MG 24 hr tablet, Take 1  tablet (400 mg total) by mouth at bedtime., Disp: 30 tablet, Rfl: 1   temazepam (RESTORIL) 15 MG capsule, Take 1 capsule (15 mg total) by mouth at bedtime., Disp: 30 capsule, Rfl: 0   triamterene-hydrochlorothiazide (MAXZIDE-25) 37.5-25 MG tablet, TAKE 1 TABLET BY MOUTH EVERY DAY, Disp: 30 tablet, Rfl: 8   beclomethasone (QVAR) 80 MCG/ACT inhaler, Inhale 1-2 puffs into the lungs 2 (two) times daily. (Patient not taking: Reported on 02/19/2020), Disp: 10.6 g, Rfl: 1   predniSONE (DELTASONE) 10 MG tablet, 4 tabs for 2 days, then 3 tabs for 2 days, 2 tabs for 2 days, then 1 tab for 2 days, then stop (Patient not taking: Reported on 02/19/2020), Disp: 20 tablet, Rfl: 0  No Known Allergies  Objective:   There were no vitals taken for this visit. AAOx3, NAD NCAT, EOMI No obvious CN deficits Coloring WNL Pt is able to speak clearly, coherently without shortness of breath or increased work of breathing.  Thought process is linear.  Mood is appropriate.   Assessment and Plan:   URI- pt saw UC yesterday and has an appt later today w/ pulmonary.  Suspect viral illness as there was no defined infxn noted on CXR but there was a nonspecific opacity seen on lateral view.  Pt saw pulmonary this summer and CT scan was ordered- which is what was recommended to f/u this xray finding.  Will defer to pulmonary's expertise going forward and the next best steps.  Pt expressed understanding and is in agreement w/ plan.   Annye Asa, MD 02/19/2020

## 2020-02-20 ENCOUNTER — Other Ambulatory Visit: Payer: Self-pay

## 2020-02-20 ENCOUNTER — Other Ambulatory Visit
Admission: RE | Admit: 2020-02-20 | Discharge: 2020-02-20 | Disposition: A | Discharge status: Home / Self Care | Payer: Medicare Other | Source: Home / Self Care | Attending: Primary Care | Admitting: Primary Care

## 2020-02-20 ENCOUNTER — Ambulatory Visit
Admission: RE | Admit: 2020-02-20 | Discharge: 2020-02-20 | Disposition: A | Discharge status: Home / Self Care | Payer: Medicare Other | Source: Ambulatory Visit | Attending: Primary Care | Admitting: Primary Care

## 2020-02-20 ENCOUNTER — Telehealth: Payer: Self-pay | Admitting: Family Medicine

## 2020-02-20 DIAGNOSIS — R0789 Other chest pain: Secondary | ICD-10-CM

## 2020-02-20 DIAGNOSIS — R0781 Pleurodynia: Secondary | ICD-10-CM | POA: Insufficient documentation

## 2020-02-20 HISTORY — DX: Unspecified asthma, uncomplicated: J45.909

## 2020-02-20 LAB — POCT I-STAT CREATININE: Creatinine, Ser: 0.7 mg/dL (ref 0.44–1.00)

## 2020-02-20 LAB — FIBRIN DERIVATIVES D-DIMER (ARMC ONLY): Fibrin derivatives D-dimer (ARMC): 1056.03 ng/mL (FEU) — ABNORMAL HIGH (ref 0.00–499.00)

## 2020-02-20 MED ORDER — AMOXICILLIN-POT CLAVULANATE 875-125 MG PO TABS
1.0000 | ORAL_TABLET | Freq: Two times a day (BID) | ORAL | 0 refills | Status: DC
Start: 2020-02-20 — End: 2020-03-03

## 2020-02-20 MED ORDER — IOHEXOL 350 MG/ML SOLN
75.0000 mL | Freq: Once | INTRAVENOUS | Status: AC | PRN
  Administered 2020-02-20: 75 mL via INTRAVENOUS

## 2020-02-20 NOTE — Telephone Encounter (Signed)
Patient says that she had a ct scan and she has pneumonia on her left side - not a blood clot.

## 2020-02-20 NOTE — Addendum Note (Signed)
Addended by: Martyn Ehrich on: 02/20/2020 12:58 PM   Modules accepted: Orders

## 2020-02-20 NOTE — Progress Notes (Signed)
D-dimer was elevated, needs to have CTA that I ordered stat

## 2020-02-20 NOTE — Progress Notes (Signed)
D-dimer was elevated. CTA negative for PE but showed left lower pneumonia. Sending in Augmentin 1 tab twice daily x 1 week. Needs repeat imaging in 4-6 weeks to ensure resolution.

## 2020-02-29 ENCOUNTER — Telehealth: Payer: Self-pay | Admitting: Internal Medicine

## 2020-02-29 NOTE — Telephone Encounter (Signed)
Yes ok if completed abx course and feeling well. Strongly encourage masking, would wear warm clothes and not over exert herself.

## 2020-02-29 NOTE — Telephone Encounter (Signed)
Patient is aware below recommendations. She voiced her understanding and had no further questions.  Nothing further needed.

## 2020-02-29 NOTE — Telephone Encounter (Signed)
Spoke to patient via telephone.  Patient stated that completed course of abx for PNA on Tuesday. She [place]. She is questioning if it would be okay to attend and be around other people after recently having PNA.   [name], please advise. Dr. Mortimer Fries is unavailable. Thanks

## 2020-03-03 ENCOUNTER — Encounter: Payer: Self-pay | Admitting: Family Medicine

## 2020-03-03 ENCOUNTER — Ambulatory Visit (INDEPENDENT_AMBULATORY_CARE_PROVIDER_SITE_OTHER): Payer: Medicare Other | Admitting: Family Medicine

## 2020-03-03 ENCOUNTER — Other Ambulatory Visit: Payer: Self-pay

## 2020-03-03 VITALS — BP 128/70 | HR 70 | Temp 97.8°F | Resp 20 | Ht 61.0 in | Wt 109.6 lb

## 2020-03-03 DIAGNOSIS — I10 Essential (primary) hypertension: Secondary | ICD-10-CM | POA: Diagnosis not present

## 2020-03-03 DIAGNOSIS — E785 Hyperlipidemia, unspecified: Secondary | ICD-10-CM

## 2020-03-03 DIAGNOSIS — Z23 Encounter for immunization: Secondary | ICD-10-CM | POA: Diagnosis not present

## 2020-03-03 LAB — CBC WITH DIFFERENTIAL/PLATELET
Basophils Absolute: 0 10*3/uL (ref 0.0–0.1)
Basophils Relative: 0.5 % (ref 0.0–3.0)
Eosinophils Absolute: 0.1 10*3/uL (ref 0.0–0.7)
Eosinophils Relative: 0.7 % (ref 0.0–5.0)
HCT: 36.4 % (ref 36.0–46.0)
Hemoglobin: 12 g/dL (ref 12.0–15.0)
Lymphocytes Relative: 13.4 % (ref 12.0–46.0)
Lymphs Abs: 1 10*3/uL (ref 0.7–4.0)
MCHC: 33.1 g/dL (ref 30.0–36.0)
MCV: 90.6 fl (ref 78.0–100.0)
Monocytes Absolute: 0.5 10*3/uL (ref 0.1–1.0)
Monocytes Relative: 6.1 % (ref 3.0–12.0)
Neutro Abs: 6.1 10*3/uL (ref 1.4–7.7)
Neutrophils Relative %: 79.3 % — ABNORMAL HIGH (ref 43.0–77.0)
Platelets: 262 10*3/uL (ref 150.0–400.0)
RBC: 4.02 Mil/uL (ref 3.87–5.11)
RDW: 12.5 % (ref 11.5–15.5)
WBC: 7.7 10*3/uL (ref 4.0–10.5)

## 2020-03-03 LAB — LIPID PANEL
Cholesterol: 158 mg/dL (ref 0–200)
HDL: 51.9 mg/dL (ref >39.00)
LDL Cholesterol: 96 mg/dL (ref 0–99)
NonHDL: 106
Total CHOL/HDL Ratio: 3
Triglycerides: 52 mg/dL (ref 0.0–149.0)
VLDL: 10.4 mg/dL (ref 0.0–40.0)

## 2020-03-03 LAB — TSH: TSH: 1.19 u[IU]/mL (ref 0.35–4.50)

## 2020-03-03 LAB — HEPATIC FUNCTION PANEL
ALT: 33 U/L (ref 0–35)
AST: 25 U/L (ref 0–37)
Albumin: 4.1 g/dL (ref 3.5–5.2)
Alkaline Phosphatase: 80 U/L (ref 39–117)
Bilirubin, Direct: 0.1 mg/dL (ref 0.0–0.3)
Total Bilirubin: 0.3 mg/dL (ref 0.2–1.2)
Total Protein: 6.7 g/dL (ref 6.0–8.3)

## 2020-03-03 LAB — BASIC METABOLIC PANEL
BUN: 13 mg/dL (ref 6–23)
CO2: 30 mEq/L (ref 19–32)
Calcium: 9.1 mg/dL (ref 8.4–10.5)
Chloride: 103 mEq/L (ref 96–112)
Creatinine, Ser: 0.72 mg/dL (ref 0.40–1.20)
GFR: 87.9 mL/min (ref >60.00)
Glucose, Bld: 81 mg/dL (ref 70–99)
Potassium: 4.4 mEq/L (ref 3.5–5.1)
Sodium: 141 mEq/L (ref 135–145)

## 2020-03-03 NOTE — Progress Notes (Signed)
   Subjective:    Patient ID: Theresa Martin, female    DOB: May 04, 1955, 64 y.o.   MRN: [id number]  HPI HTN- chronic problem, on Triamterene HCTZ 37.5/25mg  daily w/ adequate control.  Denies CP, SOB, HAs, visual changes, edema.  Hyperlipidemia- chronic problem, on Lipitor 20mg  daily.  Denies abd pain, N/V.  No regular exercise at this time   Review of Systems For ROS see HPI   This visit occurred during the SARS-CoV-2 public health emergency.  Safety protocols were in place, including screening questions prior to the visit, additional usage of staff PPE, and extensive cleaning of exam room while observing appropriate contact time as indicated for disinfecting solutions.       Objective:   Physical Exam Vitals reviewed.  Constitutional:      General: She is not in acute distress.    Appearance: Normal appearance. She is well-developed. She is not ill-appearing.  HENT:     Head: Normocephalic and atraumatic.  Eyes:     Conjunctiva/sclera: Conjunctivae normal.     Pupils: Pupils are equal, round, and reactive to light.  Neck:     Thyroid: No thyromegaly.  Cardiovascular:     Rate and Rhythm: Normal rate and regular rhythm.     Heart sounds: Normal heart sounds. No murmur heard.   Pulmonary:     Effort: Pulmonary effort is normal. No respiratory distress.     Breath sounds: Normal breath sounds.  Abdominal:     General: There is no distension.     Palpations: Abdomen is soft.     Tenderness: There is no abdominal tenderness.  Musculoskeletal:     Cervical back: Normal range of motion and neck supple.     Right lower leg: No edema.     Left lower leg: No edema.  Lymphadenopathy:     Cervical: No cervical adenopathy.  Skin:    General: Skin is warm and dry.  Neurological:     Mental Status: She is alert and oriented to person, place, and time.  Psychiatric:        Behavior: Behavior normal.           Assessment & Plan:

## 2020-03-03 NOTE — Patient Instructions (Addendum)
Follow up in 6 months to recheck blood pressure and cholesterol We'll notify you of your lab results and make any changes if needed Continue to work on healthy diet and regular exercise- you can do it! Call with any questions or concerns Stay Safe!  Stay Healthy! Happy Holidays!!

## 2020-03-04 DIAGNOSIS — Z23 Encounter for immunization: Secondary | ICD-10-CM

## 2020-03-04 NOTE — Assessment & Plan Note (Signed)
Chronic problem.  On Lipitor 20mg  daily.  Asymptomatic.  No longer exercising.  Check labs.  Adjust meds prn

## 2020-03-04 NOTE — Assessment & Plan Note (Signed)
Chronic problem.  Adequate control on current Triamterene HCTZ 37.5/25mg  daily.  Asymptomatic.  Check labs.  No anticipated med changes.  Will follow.

## 2020-03-10 ENCOUNTER — Telehealth: Payer: Self-pay | Admitting: Family Medicine

## 2020-03-10 NOTE — Telephone Encounter (Signed)
Spoke with patient she was out shopping stated to call her back tomorrow

## 2020-03-11 ENCOUNTER — Telehealth: Payer: Self-pay | Admitting: Family Medicine

## 2020-03-11 NOTE — Telephone Encounter (Signed)
Attempted to schedule AWV. Unable to LVM.  Will try at later time.   Called patient to schedule Annual Wellness Visit.  Please schedule with Nurse Health Advisor Caroleen Hamman, RN at [facility]

## 2020-03-14 DIAGNOSIS — M542 Cervicalgia: Secondary | ICD-10-CM | POA: Diagnosis not present

## 2020-03-14 DIAGNOSIS — M9901 Segmental and somatic dysfunction of cervical region: Secondary | ICD-10-CM | POA: Diagnosis not present

## 2020-03-14 DIAGNOSIS — M9908 Segmental and somatic dysfunction of rib cage: Secondary | ICD-10-CM | POA: Diagnosis not present

## 2020-03-14 DIAGNOSIS — M9903 Segmental and somatic dysfunction of lumbar region: Secondary | ICD-10-CM | POA: Diagnosis not present

## 2020-03-14 DIAGNOSIS — M9902 Segmental and somatic dysfunction of thoracic region: Secondary | ICD-10-CM | POA: Diagnosis not present

## 2020-03-14 DIAGNOSIS — M25511 Pain in right shoulder: Secondary | ICD-10-CM | POA: Diagnosis not present

## 2020-03-14 DIAGNOSIS — G894 Chronic pain syndrome: Secondary | ICD-10-CM | POA: Diagnosis not present

## 2020-03-14 DIAGNOSIS — M9905 Segmental and somatic dysfunction of pelvic region: Secondary | ICD-10-CM | POA: Diagnosis not present

## 2020-03-14 DIAGNOSIS — M546 Pain in thoracic spine: Secondary | ICD-10-CM | POA: Diagnosis not present

## 2020-03-14 DIAGNOSIS — M9904 Segmental and somatic dysfunction of sacral region: Secondary | ICD-10-CM | POA: Diagnosis not present

## 2020-03-20 NOTE — Progress Notes (Signed)
Subjective:   Theresa Martin is a 64 y.o. female who presents for Medicare Annual (Subsequent) preventive examination.  I connected with Theresa Martin today by telephone and verified that I am speaking with the correct person using two identifiers. Location patient: home Location provider: work Persons participating in the virtual visit: patient, [name].    I discussed the limitations, risks, security and privacy concerns of performing an evaluation and management service by telephone and the availability of in person appointments. I also discussed with the patient that there may be a patient responsible charge related to this service. The patient expressed understanding and verbally consented to this telephonic visit.    Interactive audio and video telecommunications were attempted between this provider and patient, however failed, due to patient having technical difficulties OR patient did not have access to video capability.  We continued and completed visit with audio only.  Some vital signs may be absent or patient reported.   Time Spent with patient on telephone encounter: 25 minutes   Review of Systems     Cardiac Risk Factors include: advanced age (>14men, >61 women);hypertension;dyslipidemia;sedentary lifestyle     Objective:    Today's Vitals   03/24/20 0902  Weight: 109 lb (49.4 kg)  Height: 5\' 1"  (1.549 m)   Body mass index is 20.6 kg/m.  Advanced Directives 03/24/2020 02/28/2019 01/31/2019 01/28/2019 01/22/2019 09/30/2018 09/29/2018  Does Patient Have a Medical Advance Directive? No No No;Yes No Yes No No  Type of Advance Directive - - Healthcare Power of [place] in Chart? - - No - copy requested - No - copy requested - -  Would patient like information on creating a medical advance directive? No - Patient declined Yes (MAU/Ambulatory/Procedural Areas - Information given) No - Patient declined - No -  Patient declined No - Patient declined -  Some encounter information is confidential and restricted. Go to Review Flowsheets activity to see all data.    Current Medications (verified) Outpatient Encounter Medications as of 03/24/2020  Medication Sig  . albuterol (VENTOLIN HFA) 108 (90 Base) MCG/ACT inhaler TAKE 2 PUFFS BY MOUTH EVERY 6 HOURS AS NEEDED FOR WHEEZE OR SHORTNESS OF BREATH  . atorvastatin (LIPITOR) 20 MG tablet TAKE 1 TABLET BY MOUTH EVERY DAY  . budesonide-formoterol (SYMBICORT) 160-4.5 MCG/ACT inhaler Inhale 2 puffs into the lungs in the morning and at bedtime.  . clonazePAM (KLONOPIN) 0.5 MG tablet Take 0.5 mg by mouth daily as needed.  Marland Kitchen escitalopram (LEXAPRO) 10 MG tablet Take 10 mg by mouth daily.  Marland Kitchen gabapentin (NEURONTIN) 400 MG capsule Take 1 capsule (400 mg total) by mouth 2 (two) times daily.  Marland Kitchen latanoprost (XALATAN) 0.005 % ophthalmic solution Place 1 drop into both eyes at bedtime.  . montelukast (SINGULAIR) 10 MG tablet TAKE 1 TABLET BY MOUTH EVERYDAY AT BEDTIME  . QUEtiapine (SEROQUEL XR) 400 MG 24 hr tablet Take 1 tablet (400 mg total) by mouth at bedtime.  . temazepam (RESTORIL) 15 MG capsule Take 1 capsule (15 mg total) by mouth at bedtime.  . triamterene-hydrochlorothiazide (MAXZIDE-25) 37.5-25 MG tablet TAKE 1 TABLET BY MOUTH EVERY DAY   No facility-administered encounter medications on file as of 03/24/2020.    Allergies (verified) Patient has no known allergies.   History: Past Medical History:  Diagnosis Date  . Allergy   . Anxiety   . Asthma   . Depression   . Hyperlipidemia   .  Hypertension   . Osteopenia after menopause   . Persistent headaches   . Pneumonia 2020  . Postherpetic neuralgia   . Renal disorder   . Shingles outbreak 02/2014  . Skin cancer    moles on right groin and right buttock.   Past Surgical History:  Procedure Laterality Date  . CARDIOVASCULAR STRESS TEST  02/2009   treadmill stress test: Low risk  . [place]  . COLONOSCOPY     2014/2015 [place], normal  . EXPLORATORY LAPAROTOMY     laproscopy for infertility  . EYE SURGERY Bilateral    laser-correct opening b/tn cornea and iris  . MOLE REMOVAL  2017   x 2 moles   Family History  Problem Relation Age of Onset  . Hypertension Mother   . Coronary artery disease Mother   . Heart failure Mother   . Hypertension Father   . Liver disease Sister        liver failure  . Arrhythmia Brother   . Hypertension Brother   . Cancer Neg Hx        colon   . Breast cancer Neg Hx    Social History   Socioeconomic History  . Marital status: Legally Separated    Spouse name: Not on file  . Number of children: 2  . Years of education: Not on file  . Highest education level: Not on file  Occupational History  . Occupation: Control and instrumentation engineer  Tobacco Use  . Smoking status: Never Smoker  . Smokeless tobacco: Never Used  Vaping Use  . Vaping Use: Never used  Substance and Sexual Activity  . Alcohol use: No    Alcohol/week: 0.0 standard drinks  . Drug use: No  . Sexual activity: Not on file  Other Topics Concern  . Not on file  Social History Narrative   Regular exercise- yes, spins daily   Diet: fruits and veggies, water    No caffeine   Lives alone    Social Determinants of Health   Financial Resource Strain: Low Risk   . Difficulty of Paying Living Expenses: Not hard at all  Food Insecurity: No Food Insecurity  . Worried About Charity fundraiser in the Last Year: Never true  . Ran Out of Food in the Last Year: Never true  Transportation Needs: No Transportation Needs  . Lack of Transportation (Medical): No  . Lack of Transportation (Non-Medical): No  Physical Activity: Inactive  . Days of Exercise per Week: 0 days  . Minutes of Exercise per Session: 0 min  Stress: No Stress Concern Present  . Feeling of Stress : Not at all  Social Connections: Moderately Isolated  . Frequency of Communication with Friends  and Family: More than three times a week  . Frequency of Social Gatherings with Friends and Family: Once a week  . Attends Religious Services: More than 4 times per year  . Active Member of Clubs or Organizations: No  . Attends Archivist Meetings: Never  . Marital Status: Separated    Tobacco Counseling Counseling given: Not Answered   Clinical Intake:  Pre-visit preparation completed: Yes  Pain : No/denies pain     Nutritional Status: BMI of 19-24  Normal Nutritional Risks: None Diabetes: No  How often do you need to have someone help you when you read instructions, pamphlets, or other written materials from your doctor or pharmacy?: 1 - Never What is the last grade level you completed in  school?: 12th grade  Diabetic?No  Interpreter Needed?: No  Information entered by :: Caroleen Hamman LPN   Activities of Daily Living In your present state of health, do you have any difficulty performing the following activities: 03/24/2020 03/03/2020  Hearing? N N  Vision? N N  Difficulty concentrating or making decisions? N N  Walking or climbing stairs? N N  Dressing or bathing? N N  Doing errands, shopping? N N  Preparing Food and eating ? N -  Using the Toilet? N -  In the past six months, have you accidently leaked urine? N -  Do you have problems with loss of bowel control? N -  Managing your Medications? N -  Managing your Finances? N -  Housekeeping or managing your Housekeeping? N -  Some recent data might be hidden    Patient Care Team: [name], MD as PCP - General (Family Medicine) Dalia Heading, CNM (Inactive) as Midwife (Certified Nurse Midwife) Bary Castilla, Forest Gleason, MD (General Surgery) Ulis Rias, MD (Psychiatry) Penni Bombard, MD as Consulting Physician (Neurology) Sable Feil, MD as Consulting Physician (Gastroenterology) Rushie Chestnut (Psychiatry) Mickle Plumb [facility]) Dermatology, [facility]  Skin & (Dermatology) Gerda Diss, DO as Consulting Physician (Family Medicine) Debbora Presto, NP as Nurse Practitioner (Neurology) Flora Lipps, MD as Consulting Physician (Pulmonary Disease)  Indicate any recent Medical Services you may have received from other than [facility] providers in the past year (date may be approximate).     Assessment:   This is a routine wellness examination for [place].  Hearing/Vision screen  Hearing Screening   125Hz  250Hz  500Hz  1000Hz  2000Hz  3000Hz  4000Hz  6000Hz  8000Hz   Right ear:           Left ear:           Comments: No issues  Vision Screening Comments: Wears glasses Last eye exam-2021-[facility]  Dietary issues and exercise activities discussed: Current Exercise Habits: The patient does not participate in regular exercise at present, Exercise limited by: None identified  Goals      Patient Stated   .  patient states (pt-stated)      Maintain current health by staying active and making healthy food choices.       Other   .  Patient Stated      Maintain current health by staying active and eating well.     .  Patient Stated      Regain independence       Depression Screen PHQ 2/9 Scores 03/24/2020 03/03/2020 02/19/2020 09/05/2019 08/10/2019 07/20/2019 02/28/2019  PHQ - 2 Score 0 0 0 0 0 4 0  PHQ- 9 Score - 0 0 0 - 6 0    Fall Risk Fall Risk  03/24/2020 03/03/2020 02/19/2020 09/05/2019 08/10/2019  Falls in the past year? 0 0 0 0 0  Number falls in past yr: 0 0 0 0 0  Injury with Fall? 0 0 0 0 0  Risk for fall due to : - No Fall Risks No Fall Risks - -  Follow up Falls prevention discussed - - Falls evaluation completed Falls evaluation completed    FALL RISK PREVENTION PERTAINING TO THE HOME:  Any stairs in or around the home? No  Home free of loose throw rugs in walkways, pet beds, electrical cords, etc? Yes  Adequate lighting in your home to reduce risk of falls? Yes   ASSISTIVE DEVICES UTILIZED TO PREVENT FALLS:  Life alert?  No  Use of  a cane, walker or w/c? No  Grab bars in the bathroom? No  Shower chair or bench in shower? No  Elevated toilet seat or a handicapped toilet? No   TIMED UP AND GO:  Was the test performed? No . Phone visit   Cognitive Function:Normal cognitive status assessed by this Nurse Health Advisor. No abnormalities found.   MMSE - Mini Mental State Exam 06/03/2016  Orientation to time 5  Orientation to Place 5  Registration 3  Attention/ Calculation 5  Recall 3  Language- name 2 objects 2  Language- repeat 1  Language- follow 3 step command 3  Language- read & follow direction 1  Write a sentence 1  Copy design 1  Total score 30        Immunizations Immunization History  Administered Date(s) Administered  . Fluad Quad(high Dose 65+) 03/04/2020  . Influenza,inj,Quad PF,6+ Mos 01/19/2017, 01/26/2019  . Moderna Sars-Covid-2 Vaccination 09/03/2019, 09/24/2019  . Pneumococcal Conjugate-13 01/17/2020  . Pneumococcal Polysaccharide-23 12/25/2018  . Tdap 07/27/2012  . Zoster 09/26/2014  . Zoster Recombinat (Shingrix) 05/04/2018, 10/26/2018    TDAP status: Up to date  Flu Vaccine status: Up to date  Pneumococcal vaccine status: Up to date  Covid-19 vaccine status: Completed vaccines Booster due 03/2020  Qualifies for Shingles Vaccine? No   Zostavax completed Yes   Shingrix Completed?: Yes  Screening Tests Health Maintenance  Topic Date Due  . COVID-19 Vaccine (2 - Moderna risk 4-dose series) 10/22/2019  . COLONOSCOPY (Pts 45-15yrs Insurance coverage will need to be confirmed)  01/22/2020  . PAP SMEAR-Modifier  02/20/2021  . MAMMOGRAM  03/01/2021  . TETANUS/TDAP  07/28/2022  . PNA vac Low Risk Adult (2 of 2 - PPSV23) 12/25/2023  . INFLUENZA VACCINE  Completed  . DEXA SCAN  Completed  . Hepatitis C Screening  Completed  . HIV Screening  Completed    Health Maintenance  Health Maintenance Due  Topic Date Due  . COVID-19 Vaccine (2 - Moderna risk 4-dose  series) 10/22/2019  . COLONOSCOPY (Pts 45-42yrs Insurance coverage will need to be confirmed)  01/22/2020    Colorectal cancer screening: Referral to GI placed today. Pt aware the office will call re: appt.  Mammogram status: Ordered today. Pt provided with contact info and advised to call to schedule appt.   Bone Density status: Ordered today. Pt provided with contact info and advised to call to schedule appt.  Lung Cancer Screening: (Low Dose CT Chest recommended if Age 94-80 years, 30 pack-year currently smoking OR have quit w/in 15years.) does not qualify.    Additional Screening:  Hepatitis C Screening:  Completed 12/03/2015  Vision Screening: Recommended annual ophthalmology exams for early detection of glaucoma and other disorders of the eye. Is the patient up to date with their annual eye exam?  Yes  Who is the provider or what is the name of the office in which the patient attends annual eye exams? [facility]   Dental Screening: Recommended annual dental exams for proper oral hygiene  Community Resource Referral / Chronic Care Management: CRR required this visit?  No   CCM required this visit?  No      Plan:     I have personally reviewed and noted the following in the patient's chart:   . Medical and social history . Use of alcohol, tobacco or illicit drugs  . Current medications and supplements . Functional ability and status . Nutritional status . Physical activity . Advanced directives . List  of other physicians . Hospitalizations, surgeries, and ER visits in previous 12 months . Vitals . Screenings to include cognitive, depression, and falls . Referrals and appointments  In addition, I have reviewed and discussed with patient certain preventive protocols, quality metrics, and best practice recommendations. A written personalized care plan for preventive services as well as general preventive health recommendations were provided to patient.   Due  to this being a telephonic visit, the after visit summary with patients personalized plan was offered to patient via mail or my-chart.  Patient would like to access on my-chart.    Marta Antu, LPN   [id number]  Nurse Health Advisor  Nurse Notes: None

## 2020-03-22 ENCOUNTER — Encounter: Payer: Self-pay | Admitting: Family Medicine

## 2020-03-24 ENCOUNTER — Other Ambulatory Visit: Payer: Self-pay

## 2020-03-24 ENCOUNTER — Ambulatory Visit (INDEPENDENT_AMBULATORY_CARE_PROVIDER_SITE_OTHER): Payer: Medicare Other

## 2020-03-24 ENCOUNTER — Ambulatory Visit: Payer: Medicare Other | Admitting: Family Medicine

## 2020-03-24 VITALS — Ht 61.0 in | Wt 109.0 lb

## 2020-03-24 DIAGNOSIS — Z1231 Encounter for screening mammogram for malignant neoplasm of breast: Secondary | ICD-10-CM

## 2020-03-24 DIAGNOSIS — Z0000 Encounter for general adult medical examination without abnormal findings: Secondary | ICD-10-CM | POA: Diagnosis not present

## 2020-03-24 DIAGNOSIS — Z1211 Encounter for screening for malignant neoplasm of colon: Secondary | ICD-10-CM

## 2020-03-24 DIAGNOSIS — Z780 Asymptomatic menopausal state: Secondary | ICD-10-CM

## 2020-03-24 NOTE — Patient Instructions (Signed)
Ms. Theresa Martin , Thank you for taking time to complete your Medicare Wellness Visit. I appreciate your ongoing commitment to your health goals. Please review the following plan we discussed and let me know if I can assist you in the future.   Screening recommendations/referrals: Colonoscopy: Referral ordered today. Someone will be calling you to schedule. Mammogram: Ordered today. Someone will be calling you to schedule. Bone Density: Ordered today. Someone will be calling you to schedule. Recommended yearly ophthalmology/optometry visit for glaucoma screening and checkup Recommended yearly dental visit for hygiene and checkup  Vaccinations: Influenza vaccine: Up to date Pneumococcal vaccine: Completed vaccines Tdap vaccine: Up to date-Due-07/28/2022 Shingles vaccine: Completed vaccines   Covid-19:Booster due next month.  Advanced directives: Please bring a copy for your chart once completed.  Conditions/risks identified: See problem list  Next appointment: Follow up in one year for your annual wellness visit 04/06/2021 @ 9:00   Preventive Care 65 Years and Older, Female Preventive care refers to lifestyle choices and visits with your health care provider that can promote health and wellness. What does preventive care include?  A yearly physical exam. This is also called an annual well check.  Dental exams once or twice a year.  Routine eye exams. Ask your health care provider how often you should have your eyes checked.  Personal lifestyle choices, including:  Daily care of your teeth and gums.  Regular physical activity.  Eating a healthy diet.  Avoiding tobacco and drug use.  Limiting alcohol use.  Practicing safe sex.  Taking low-dose aspirin every day.  Taking vitamin and mineral supplements as recommended by your health care provider. What happens during an annual well check? The services and screenings done by your health care provider during your annual well  check will depend on your age, overall health, lifestyle risk factors, and family history of disease. Counseling  Your health care provider may ask you questions about your:  Alcohol use.  Tobacco use.  Drug use.  Emotional well-being.  Home and relationship well-being.  Sexual activity.  Eating habits.  History of falls.  Memory and ability to understand (cognition).  Work and work Astronomer.  Reproductive health. Screening  You may have the following tests or measurements:  Height, weight, and BMI.  Blood pressure.  Lipid and cholesterol levels. These may be checked every 5 years, or more frequently if you are over 2 years old.  Skin check.  Lung cancer screening. You may have this screening every year starting at age 62 if you have a 30-pack-year history of smoking and currently smoke or have quit within the past 15 years.  Fecal occult blood test (FOBT) of the stool. You may have this test every year starting at age 64.  Flexible sigmoidoscopy or colonoscopy. You may have a sigmoidoscopy every 5 years or a colonoscopy every 10 years starting at age 53.  Hepatitis C blood test.  Hepatitis B blood test.  Sexually transmitted disease (STD) testing.  Diabetes screening. This is done by checking your blood sugar (glucose) after you have not eaten for a while (fasting). You may have this done every 1-3 years.  Bone density scan. This is done to screen for osteoporosis. You may have this done starting at age 41.  Mammogram. This may be done every 1-2 years. Talk to your health care provider about how often you should have regular mammograms. Talk with your health care provider about your test results, treatment options, and if necessary, the need for more  tests. Vaccines  Your health care provider may recommend certain vaccines, such as:  Influenza vaccine. This is recommended every year.  Tetanus, diphtheria, and acellular pertussis (Tdap, Td) vaccine. You  may need a Td booster every 10 years.  Zoster vaccine. You may need this after age 62.  Pneumococcal 13-valent conjugate (PCV13) vaccine. One dose is recommended after age 36.  Pneumococcal polysaccharide (PPSV23) vaccine. One dose is recommended after age 37. Talk to your health care provider about which screenings and vaccines you need and how often you need them. This information is not intended to replace advice given to you by your health care provider. Make sure you discuss any questions you have with your health care provider. Document Released: 04/11/2015 Document Revised: 12/03/2015 Document Reviewed: 01/14/2015 Elsevier Interactive Patient Education  2017 [place] Prevention in the Home Falls can cause injuries. They can happen to people of all ages. There are many things you can do to make your home safe and to help prevent falls. What can I do on the outside of my home?  Regularly fix the edges of walkways and driveways and fix any cracks.  Remove anything that might make you trip as you walk through a door, such as a raised step or threshold.  Trim any bushes or trees on the path to your home.  Use bright outdoor lighting.  Clear any walking paths of anything that might make someone trip, such as rocks or tools.  Regularly check to see if handrails are loose or broken. Make sure that both sides of any steps have handrails.  Any raised decks and porches should have guardrails on the edges.  Have any leaves, snow, or ice cleared regularly.  Use sand or salt on walking paths during winter.  Clean up any spills in your garage right away. This includes oil or grease spills. What can I do in the bathroom?  Use night lights.  Install grab bars by the toilet and in the tub and shower. Do not use towel bars as grab bars.  Use non-skid mats or decals in the tub or shower.  If you need to sit down in the shower, use a plastic, non-slip stool.  Keep the floor  dry. Clean up any water that spills on the floor as soon as it happens.  Remove soap buildup in the tub or shower regularly.  Attach bath mats securely with double-sided non-slip rug tape.  Do not have throw rugs and other things on the floor that can make you trip. What can I do in the bedroom?  Use night lights.  Make sure that you have a light by your bed that is easy to reach.  Do not use any sheets or blankets that are too big for your bed. They should not hang down onto the floor.  Have a firm chair that has side arms. You can use this for support while you get dressed.  Do not have throw rugs and other things on the floor that can make you trip. What can I do in the kitchen?  Clean up any spills right away.  Avoid walking on wet floors.  Keep items that you use a lot in easy-to-reach places.  If you need to reach something above you, use a strong step stool that has a grab bar.  Keep electrical cords out of the way.  Do not use floor polish or wax that makes floors slippery. If you must use wax, use non-skid floor  wax.  Do not have throw rugs and other things on the floor that can make you trip. What can I do with my stairs?  Do not leave any items on the stairs.  Make sure that there are handrails on both sides of the stairs and use them. Fix handrails that are broken or loose. Make sure that handrails are as long as the stairways.  Check any carpeting to make sure that it is firmly attached to the stairs. Fix any carpet that is loose or worn.  Avoid having throw rugs at the top or bottom of the stairs. If you do have throw rugs, attach them to the floor with carpet tape.  Make sure that you have a light switch at the top of the stairs and the bottom of the stairs. If you do not have them, ask someone to add them for you. What else can I do to help prevent falls?  Wear shoes that:  Do not have high heels.  Have rubber bottoms.  Are comfortable and fit you  well.  Are closed at the toe. Do not wear sandals.  If you use a stepladder:  Make sure that it is fully opened. Do not climb a closed stepladder.  Make sure that both sides of the stepladder are locked into place.  Ask someone to hold it for you, if possible.  Clearly mark and make sure that you can see:  Any grab bars or handrails.  First and last steps.  Where the edge of each step is.  Use tools that help you move around (mobility aids) if they are needed. These include:  Canes.  Walkers.  Scooters.  Crutches.  Turn on the lights when you go into a dark area. Replace any light bulbs as soon as they burn out.  Set up your furniture so you have a clear path. Avoid moving your furniture around.  If any of your floors are uneven, fix them.  If there are any pets around you, be aware of where they are.  Review your medicines with your doctor. Some medicines can make you feel dizzy. This can increase your chance of falling. Ask your doctor what other things that you can do to help prevent falls. This information is not intended to replace advice given to you by your health care provider. Make sure you discuss any questions you have with your health care provider. Document Released: 01/09/2009 Document Revised: 08/21/2015 Document Reviewed: 04/19/2014 Elsevier Interactive Patient Education  2017 [employer].

## 2020-03-26 ENCOUNTER — Other Ambulatory Visit: Payer: Self-pay

## 2020-03-26 ENCOUNTER — Telehealth (INDEPENDENT_AMBULATORY_CARE_PROVIDER_SITE_OTHER): Payer: Self-pay | Admitting: Gastroenterology

## 2020-03-26 DIAGNOSIS — Z1211 Encounter for screening for malignant neoplasm of colon: Secondary | ICD-10-CM

## 2020-03-26 MED ORDER — PEG 3350-KCL-NA BICARB-NACL 420 G PO SOLR: 4000.0000 mL | Freq: Once | ORAL | 0 refills | Status: AC

## 2020-03-26 NOTE — Progress Notes (Signed)
Gastroenterology Pre-Procedure Review  Request Date: Tuesday 04/15/20 Requesting Physician: Dr. Maximino Greenland  PATIENT REVIEW QUESTIONS: The patient responded to the following health history questions as indicated:    1. Are you having any GI issues? no 2. Do you have a personal history of Polyps? no 3. Do you have a family history of Colon Cancer or Polyps? yes (brother colon polyps) 4. Diabetes Mellitus? no 5. Joint replacements in the past 12 months?no 6. Major health problems in the past 3 months?ER Visit Pneumonia 02/18/20 7. Any artificial heart valves, MVP, or defibrillator?no    MEDICATIONS & ALLERGIES:    Patient reports the following regarding taking any anticoagulation/antiplatelet therapy:   Plavix, Coumadin, Eliquis, Xarelto, Lovenox, Pradaxa, Brilinta, or Effient? no Aspirin? no  Patient confirms/reports the following medications:  Current Outpatient Medications  Medication Sig Dispense Refill  . albuterol (VENTOLIN HFA) 108 (90 Base) MCG/ACT inhaler TAKE 2 PUFFS BY MOUTH EVERY 6 HOURS AS NEEDED FOR WHEEZE OR SHORTNESS OF BREATH 18 g 2  . atorvastatin (LIPITOR) 20 MG tablet TAKE 1 TABLET BY MOUTH EVERY DAY 90 tablet 2  . budesonide-formoterol (SYMBICORT) 160-4.5 MCG/ACT inhaler Inhale 2 puffs into the lungs in the morning and at bedtime. 1 Inhaler 5  . CALCIUM PO Take by mouth.    . Cholecalciferol (VITAMIN D3 PO) Take by mouth.    . clonazePAM (KLONOPIN) 0.5 MG tablet Take 0.5 mg by mouth daily as needed.    Marland Kitchen ELDERBERRY PO Take by mouth.    . escitalopram (LEXAPRO) 10 MG tablet Take 10 mg by mouth daily.    Marland Kitchen gabapentin (NEURONTIN) 400 MG capsule Take 1 capsule (400 mg total) by mouth 2 (two) times daily. 60 capsule 12  . latanoprost (XALATAN) 0.005 % ophthalmic solution Place 1 drop into both eyes at bedtime. 2.5 mL 12  . magnesium gluconate (MAGONATE) 500 MG tablet Take 500 mg by mouth 2 (two) times daily.    . Misc Natural Products (JOINT HEALTH PO) Take by mouth.     . montelukast (SINGULAIR) 10 MG tablet TAKE 1 TABLET BY MOUTH EVERYDAY AT BEDTIME 90 tablet 1  . Multiple Vitamins-Minerals (WOMENS MULTIVITAMIN PO) Take by mouth.    . Probiotic Product (PROBIOTIC PO) Take by mouth.    . QUEtiapine (SEROQUEL XR) 400 MG 24 hr tablet Take 1 tablet (400 mg total) by mouth at bedtime. 30 tablet 1  . temazepam (RESTORIL) 15 MG capsule Take 1 capsule (15 mg total) by mouth at bedtime. 30 capsule 0  . triamterene-hydrochlorothiazide (MAXZIDE-25) 37.5-25 MG tablet TAKE 1 TABLET BY MOUTH EVERY DAY 30 tablet 8   No current facility-administered medications for this visit.    Patient confirms/reports the following allergies:  No Known Allergies  No orders of the defined types were placed in this encounter.   AUTHORIZATION INFORMATION Primary Insurance: 1D#: Group #:  Secondary Insurance: 1D#: Group #:  SCHEDULE INFORMATION: Date: Tuesday 04/15/20 Time: Location:[facility]

## 2020-04-02 ENCOUNTER — Telehealth: Payer: Self-pay

## 2020-04-02 DIAGNOSIS — F411 Generalized anxiety disorder: Secondary | ICD-10-CM | POA: Diagnosis not present

## 2020-04-02 DIAGNOSIS — F3181 Bipolar II disorder: Secondary | ICD-10-CM | POA: Diagnosis not present

## 2020-04-02 DIAGNOSIS — F3132 Bipolar disorder, current episode depressed, moderate: Secondary | ICD-10-CM | POA: Diagnosis not present

## 2020-04-02 NOTE — Telephone Encounter (Signed)
Patients call has been returned.  She has requested to reschedule her colonoscopy from 04/15/20 to another date due to transportation.  Her colonoscopy has been rescheduled to 05/02/20.  Laquita in Endoscopy has been notified of date change.    Instructions will be sent to reflect date change.  Patient has been advised of new COVID test date.  Referral updated.  Thanks,  [place], [place]

## 2020-04-06 ENCOUNTER — Encounter: Payer: Self-pay | Admitting: Family Medicine

## 2020-04-07 DIAGNOSIS — G894 Chronic pain syndrome: Secondary | ICD-10-CM | POA: Diagnosis not present

## 2020-04-07 DIAGNOSIS — M9902 Segmental and somatic dysfunction of thoracic region: Secondary | ICD-10-CM | POA: Diagnosis not present

## 2020-04-07 DIAGNOSIS — M9908 Segmental and somatic dysfunction of rib cage: Secondary | ICD-10-CM | POA: Diagnosis not present

## 2020-04-07 DIAGNOSIS — M542 Cervicalgia: Secondary | ICD-10-CM | POA: Diagnosis not present

## 2020-04-07 DIAGNOSIS — M25511 Pain in right shoulder: Secondary | ICD-10-CM | POA: Diagnosis not present

## 2020-04-07 DIAGNOSIS — M546 Pain in thoracic spine: Secondary | ICD-10-CM | POA: Diagnosis not present

## 2020-04-07 DIAGNOSIS — M9901 Segmental and somatic dysfunction of cervical region: Secondary | ICD-10-CM | POA: Diagnosis not present

## 2020-04-11 DIAGNOSIS — F411 Generalized anxiety disorder: Secondary | ICD-10-CM | POA: Diagnosis not present

## 2020-04-11 DIAGNOSIS — F3132 Bipolar disorder, current episode depressed, moderate: Secondary | ICD-10-CM | POA: Diagnosis not present

## 2020-04-14 ENCOUNTER — Encounter: Payer: Self-pay | Admitting: Family Medicine

## 2020-04-14 ENCOUNTER — Ambulatory Visit: Payer: Medicare Other

## 2020-04-15 ENCOUNTER — Telehealth: Payer: Self-pay

## 2020-04-15 NOTE — Telephone Encounter (Signed)
Left a voicemail has some questions about procedure

## 2020-04-15 NOTE — Telephone Encounter (Signed)
Called patient back and she did not answer. I left her a message to return my call.

## 2020-04-16 ENCOUNTER — Telehealth: Payer: Self-pay

## 2020-04-16 DIAGNOSIS — F3132 Bipolar disorder, current episode depressed, moderate: Secondary | ICD-10-CM | POA: Diagnosis not present

## 2020-04-16 DIAGNOSIS — F3181 Bipolar II disorder: Secondary | ICD-10-CM | POA: Diagnosis not present

## 2020-04-16 DIAGNOSIS — F411 Generalized anxiety disorder: Secondary | ICD-10-CM | POA: Diagnosis not present

## 2020-04-16 NOTE — Telephone Encounter (Signed)
Theresa Martin,  Patient has returned call back to you.  She said you may call her back at [phone].  Thanks,  [place], [place]

## 2020-04-17 NOTE — Telephone Encounter (Signed)
Called patient but had to leave her a voicemail letting her know that I was returning her

## 2020-04-18 ENCOUNTER — Other Ambulatory Visit (HOSPITAL_BASED_OUTPATIENT_CLINIC_OR_DEPARTMENT_OTHER): Payer: Medicare Other

## 2020-04-22 ENCOUNTER — Encounter: Payer: Self-pay | Admitting: Family Medicine

## 2020-04-22 NOTE — Telephone Encounter (Signed)
Dr. Mortimer Fries, Patient uses chamber with inhalers.

## 2020-04-22 NOTE — Telephone Encounter (Signed)
Dr. Kasa, please advise. thanks 

## 2020-04-23 ENCOUNTER — Other Ambulatory Visit: Payer: Self-pay | Admitting: Adult Health

## 2020-04-25 ENCOUNTER — Ambulatory Visit (HOSPITAL_BASED_OUTPATIENT_CLINIC_OR_DEPARTMENT_OTHER)
Admission: RE | Admit: 2020-04-25 | Discharge: 2020-04-25 | Disposition: A | Discharge status: Home / Self Care | Payer: Medicare Other | Source: Ambulatory Visit | Attending: Family Medicine | Admitting: Family Medicine

## 2020-04-25 ENCOUNTER — Other Ambulatory Visit: Payer: Self-pay

## 2020-04-25 DIAGNOSIS — Z780 Asymptomatic menopausal state: Secondary | ICD-10-CM | POA: Insufficient documentation

## 2020-04-25 DIAGNOSIS — E2839 Other primary ovarian failure: Secondary | ICD-10-CM | POA: Diagnosis not present

## 2020-04-25 DIAGNOSIS — M9908 Segmental and somatic dysfunction of rib cage: Secondary | ICD-10-CM | POA: Diagnosis not present

## 2020-04-25 DIAGNOSIS — G894 Chronic pain syndrome: Secondary | ICD-10-CM | POA: Diagnosis not present

## 2020-04-25 DIAGNOSIS — M9901 Segmental and somatic dysfunction of cervical region: Secondary | ICD-10-CM | POA: Diagnosis not present

## 2020-04-25 DIAGNOSIS — M9902 Segmental and somatic dysfunction of thoracic region: Secondary | ICD-10-CM | POA: Diagnosis not present

## 2020-04-25 DIAGNOSIS — M25511 Pain in right shoulder: Secondary | ICD-10-CM | POA: Diagnosis not present

## 2020-04-25 DIAGNOSIS — G40909 Epilepsy, unspecified, not intractable, without status epilepticus: Secondary | ICD-10-CM | POA: Diagnosis not present

## 2020-04-25 DIAGNOSIS — M546 Pain in thoracic spine: Secondary | ICD-10-CM | POA: Diagnosis not present

## 2020-04-25 DIAGNOSIS — M180 Bilateral primary osteoarthritis of first carpometacarpal joints: Secondary | ICD-10-CM | POA: Diagnosis not present

## 2020-04-25 DIAGNOSIS — E559 Vitamin D deficiency, unspecified: Secondary | ICD-10-CM | POA: Diagnosis not present

## 2020-04-25 DIAGNOSIS — M85852 Other specified disorders of bone density and structure, left thigh: Secondary | ICD-10-CM | POA: Diagnosis not present

## 2020-04-25 DIAGNOSIS — M542 Cervicalgia: Secondary | ICD-10-CM | POA: Diagnosis not present

## 2020-04-30 ENCOUNTER — Telehealth: Payer: Self-pay

## 2020-04-30 ENCOUNTER — Other Ambulatory Visit
Admission: RE | Admit: 2020-04-30 | Discharge: 2020-04-30 | Disposition: A | Discharge status: Home / Self Care | Payer: Medicare Other | Source: Ambulatory Visit | Attending: Gastroenterology | Admitting: Gastroenterology

## 2020-04-30 ENCOUNTER — Other Ambulatory Visit: Payer: Self-pay

## 2020-04-30 DIAGNOSIS — Z01812 Encounter for preprocedural laboratory examination: Secondary | ICD-10-CM | POA: Diagnosis not present

## 2020-04-30 DIAGNOSIS — Z20822 Contact with and (suspected) exposure to covid-19: Secondary | ICD-10-CM | POA: Insufficient documentation

## 2020-04-30 DIAGNOSIS — Z1211 Encounter for screening for malignant neoplasm of colon: Secondary | ICD-10-CM

## 2020-04-30 LAB — SARS CORONAVIRUS 2 (TAT 6-24 HRS): SARS Coronavirus 2: NEGATIVE

## 2020-04-30 NOTE — Telephone Encounter (Signed)
Patients call has been returned to reschedule her 05/02/20 colonoscopy.  Colonoscopy has been rescheduled to 05/09/20 with Dr. Bonna Gains. Patient has been made aware of her new COVID test date 05/07/20.    New instructions will be sent via mychart.  Vikki in endo has been informed of date change.  Referral updated.  Thanks,  [place], [place]

## 2020-05-01 DIAGNOSIS — F411 Generalized anxiety disorder: Secondary | ICD-10-CM | POA: Diagnosis not present

## 2020-05-01 DIAGNOSIS — F3132 Bipolar disorder, current episode depressed, moderate: Secondary | ICD-10-CM | POA: Diagnosis not present

## 2020-05-01 DIAGNOSIS — F3181 Bipolar II disorder: Secondary | ICD-10-CM | POA: Diagnosis not present

## 2020-05-06 DIAGNOSIS — F411 Generalized anxiety disorder: Secondary | ICD-10-CM | POA: Diagnosis not present

## 2020-05-06 DIAGNOSIS — F3132 Bipolar disorder, current episode depressed, moderate: Secondary | ICD-10-CM | POA: Diagnosis not present

## 2020-05-07 ENCOUNTER — Telehealth: Payer: Self-pay

## 2020-05-07 ENCOUNTER — Other Ambulatory Visit: Admission: RE | Admit: 2020-05-07 | Payer: Medicare Other | Source: Ambulatory Visit

## 2020-05-07 NOTE — Telephone Encounter (Signed)
Patient is calling want to cancel her colonoscopy that is scheduled for 05/09/2020 She states she will call back and rescheduled the procedure when her life calms down

## 2020-05-08 ENCOUNTER — Telehealth (INDEPENDENT_AMBULATORY_CARE_PROVIDER_SITE_OTHER): Payer: Medicare Other | Admitting: Family Medicine

## 2020-05-08 DIAGNOSIS — Z8709 Personal history of other diseases of the respiratory system: Secondary | ICD-10-CM | POA: Diagnosis not present

## 2020-05-08 DIAGNOSIS — J029 Acute pharyngitis, unspecified: Secondary | ICD-10-CM

## 2020-05-08 DIAGNOSIS — K219 Gastro-esophageal reflux disease without esophagitis: Secondary | ICD-10-CM

## 2020-05-08 DIAGNOSIS — K146 Glossodynia: Secondary | ICD-10-CM | POA: Diagnosis not present

## 2020-05-08 MED ORDER — CLOTRIMAZOLE 10 MG MT TROC: 10.0000 mg | Freq: Every day | OROMUCOSAL | 0 refills | Status: DC

## 2020-05-08 NOTE — Progress Notes (Signed)
Virtual Visit via Video Note  I connected with Theresa Martin  on 05/08/20 at  3:00 PM EST by a video enabled telemedicine application and verified that I am speaking with the correct person using two identifiers.  Location patient: home, [place] Location provider:work or home office Persons participating in the virtual visit: patient, provider  I discussed the limitations of evaluation and management by telemedicine and the availability of in person appointments. The patient expressed understanding and agreed to proceed.   HPI:  Acute telemedicine visit for chest congestion and throat discomfort: -Onset:on the tongue initially, but now is having burning sensation on tongue, and sometimes in throat and upper chest- feels like heart burn (she initially felt like had maybe burned tongue with coffee) -has appointment about this with pulmonologist next week -Denies:fevers, chills, cough, difficulty swallowing or breathing - but burns with anything she eats -Has tried:pepcid -Pertinent past medical history: asthma - uses symbicort; hx of gerd - on pantoprazole 40 mg in the past - has tried some pepcid lately and helps a little it seems -Pertinent medication allergies: nkda -COVID-19 vaccine status: moderna x 2  ROS: See pertinent positives and negatives per HPI.  Past Medical History:  Diagnosis Date  . Allergy   . Anxiety   . Asthma   . Depression   . Hyperlipidemia   . Hypertension   . Osteopenia after menopause   . Persistent headaches   . Pneumonia 2020  . Postherpetic neuralgia   . Renal disorder   . Shingles outbreak 02/2014  . Skin cancer    moles on right groin and right buttock.    Past Surgical History:  Procedure Laterality Date  . CARDIOVASCULAR STRESS TEST  02/2009   treadmill stress test: Low risk  . [place]  . COLONOSCOPY     2014/2015 [place], normal  . EXPLORATORY LAPAROTOMY     laproscopy for infertility  . EYE SURGERY Bilateral     laser-correct opening b/tn cornea and iris  . MOLE REMOVAL  2017   x 2 moles     Current Outpatient Medications:  .  clotrimazole (MYCELEX) 10 MG troche, Take 1 tablet (10 mg total) by mouth 5 (five) times daily., Disp: 35 Troche, Rfl: 0 .  albuterol (VENTOLIN HFA) 108 (90 Base) MCG/ACT inhaler, TAKE 2 PUFFS BY MOUTH EVERY 6 HOURS AS NEEDED FOR WHEEZE OR SHORTNESS OF BREATH, Disp: 18 g, Rfl: 2 .  atorvastatin (LIPITOR) 20 MG tablet, TAKE 1 TABLET BY MOUTH EVERY DAY, Disp: 90 tablet, Rfl: 2 .  CALCIUM PO, Take by mouth., Disp: , Rfl:  .  Cholecalciferol (VITAMIN D3 PO), Take by mouth., Disp: , Rfl:  .  clonazePAM (KLONOPIN) 0.5 MG tablet, Take 0.5 mg by mouth daily as needed., Disp: , Rfl:  .  ELDERBERRY PO, Take by mouth., Disp: , Rfl:  .  escitalopram (LEXAPRO) 10 MG tablet, Take 10 mg by mouth daily., Disp: , Rfl:  .  gabapentin (NEURONTIN) 400 MG capsule, Take 1 capsule (400 mg total) by mouth 2 (two) times daily., Disp: 60 capsule, Rfl: 12 .  latanoprost (XALATAN) 0.005 % ophthalmic solution, Place 1 drop into both eyes at bedtime., Disp: 2.5 mL, Rfl: 12 .  magnesium gluconate (MAGONATE) 500 MG tablet, Take 500 mg by mouth 2 (two) times daily., Disp: , Rfl:  .  Misc Natural Products (JOINT HEALTH PO), Take by mouth., Disp: , Rfl:  .  montelukast (SINGULAIR) 10 MG tablet, TAKE 1 TABLET BY MOUTH  EVERYDAY AT BEDTIME, Disp: 90 tablet, Rfl: 1 .  Multiple Vitamins-Minerals (WOMENS MULTIVITAMIN PO), Take by mouth., Disp: , Rfl:  .  Probiotic Product (PROBIOTIC PO), Take by mouth., Disp: , Rfl:  .  QUEtiapine (SEROQUEL XR) 400 MG 24 hr tablet, Take 1 tablet (400 mg total) by mouth at bedtime., Disp: 30 tablet, Rfl: 1 .  SYMBICORT 160-4.5 MCG/ACT inhaler, INHALE 2 PUFFS INTO THE LUNGS IN THE MORNING AND AT BEDTIME., Disp: 10.2 each, Rfl: 5 .  temazepam (RESTORIL) 15 MG capsule, Take 1 capsule (15 mg total) by mouth at bedtime., Disp: 30 capsule, Rfl: 0 .  triamterene-hydrochlorothiazide  (MAXZIDE-25) 37.5-25 MG tablet, TAKE 1 TABLET BY MOUTH EVERY DAY, Disp: 30 tablet, Rfl: 8  EXAM:  VITALS per patient if applicable:  GENERAL: alert, oriented, appears well and in no acute distress  HEENT: atraumatic, conjunttiva clear, no obvious abnormalities on inspection of external nose and ears, and throat look fairly normal on limited video visit exam today, possible mild erythema.  NECK: normal movements of the head and neck  LUNGS: on inspection no signs of respiratory distress, breathing rate appears normal, no obvious gross SOB, gasping or wheezing  CV: no obvious cyanosis  MS: moves all visible extremities without noticeable abnormality  PSYCH/NEURO: pleasant and cooperative, no obvious depression or anxiety, speech and thought processing grossly intact  ASSESSMENT AND PLAN:  Discussed the following assessment and plan:  Burning tongue  Sore throat  Gastroesophageal reflux disease, unspecified whether esophagitis present  History of asthma  -we discussed possible serious and likely etiologies, options for evaluation and workup, limitations of telemedicine visit vs in person visit, treatment, treatment risks and precautions. Pt prefers to treat via telemedicine empirically rather than in person at this moment.  On limited video visit exam, her tongue is without plaque or significant lesions that I can see.  Did advise exam is limited given this is a video visit with not great deal quality.  Query acid reflux versus yeast versus other.  Advised swishing with water and drinking some water every time she uses her inhaler.  Also advised she talk with her pulmonologist.  Also will try a short course of getting back on her acid reflux medicine.  Advised trying her medicine daily for 1 week to see if this improves his symptoms.  Could try clotrimazole troches as well as it seems the tongue is the main issue.  Did advise if symptoms do not resolve or any worsening that she seek in  person care follow-up evaluation.  She is agreeable to this plan. Scheduled follow up with PCP offered: Agrees to schedule follow-up if needed. Advised to seek prompt in person care if worsening, new symptoms arise, or if is not improving with treatment. Discussed options for inperson care if PCP office not available. Did let this patient know that I only do telemedicine on Tuesdays and Thursdays for [facility]. Advised to schedule follow up visit with PCP or [facility] if any further questions or concerns to avoid delays in care.   I discussed the assessment and treatment plan with the patient. The patient was provided an opportunity to ask questions and all were answered. The patient agreed with the plan and demonstrated an understanding of the instructions.     Lucretia Kern, DO

## 2020-05-08 NOTE — Patient Instructions (Signed)
Please try getting back on your acid medicine for 1 week.  That is not working, can try the clotrimazole troches per instructions.  Make sure to swish with water and drink some water each time you use your inhaler.  If these measures are not helping, or if symptoms worsen, please make a prompt follow-up with your primary care doctor or your pulmonologist in person.  -I sent the medication(s) we discussed to your pharmacy: Meds ordered this encounter  Medications  . clotrimazole (MYCELEX) 10 MG troche    Sig: Take 1 tablet (10 mg total) by mouth 5 (five) times daily.    Dispense:  35 Troche    Refill:  0     I hope you are feeling better soon!  Seek in person care promptly if your symptoms worsen, new concerns arise or you are not improving with treatment.  It was nice to meet you today. I help [place] [facility] out with telemedicine visits on Tuesdays and Thursdays and am available for visits on those days. If you have any concerns or questions following this visit please schedule a follow up visit with your Primary Care doctor or seek care at a local urgent care clinic to avoid delays in care.

## 2020-05-09 ENCOUNTER — Encounter: Admission: RE | Payer: Self-pay | Source: Home / Self Care

## 2020-05-09 ENCOUNTER — Ambulatory Visit: Admission: RE | Admit: 2020-05-09 | Payer: Medicare Other | Source: Home / Self Care | Admitting: Gastroenterology

## 2020-05-09 SURGERY — COLONOSCOPY WITH PROPOFOL
Anesthesia: General

## 2020-05-12 DIAGNOSIS — H40033 Anatomical narrow angle, bilateral: Secondary | ICD-10-CM | POA: Diagnosis not present

## 2020-05-19 DIAGNOSIS — F3132 Bipolar disorder, current episode depressed, moderate: Secondary | ICD-10-CM | POA: Diagnosis not present

## 2020-05-19 DIAGNOSIS — F3181 Bipolar II disorder: Secondary | ICD-10-CM | POA: Diagnosis not present

## 2020-05-19 DIAGNOSIS — F411 Generalized anxiety disorder: Secondary | ICD-10-CM | POA: Diagnosis not present

## 2020-05-20 ENCOUNTER — Other Ambulatory Visit: Payer: Self-pay

## 2020-05-20 ENCOUNTER — Ambulatory Visit
Admission: RE | Admit: 2020-05-20 | Discharge: 2020-05-20 | Disposition: A | Discharge status: Home / Self Care | Payer: Medicare Other | Source: Ambulatory Visit | Attending: Family Medicine | Admitting: Family Medicine

## 2020-05-20 ENCOUNTER — Other Ambulatory Visit: Payer: Self-pay | Admitting: Family Medicine

## 2020-05-20 ENCOUNTER — Other Ambulatory Visit (HOSPITAL_BASED_OUTPATIENT_CLINIC_OR_DEPARTMENT_OTHER): Payer: Medicare Other

## 2020-05-20 DIAGNOSIS — Z1231 Encounter for screening mammogram for malignant neoplasm of breast: Secondary | ICD-10-CM | POA: Insufficient documentation

## 2020-05-20 NOTE — Telephone Encounter (Signed)
I talked to Theresa Martin PCP  about the burning on my tongue and down my throat  a couple weeks ago and she suggested to only drink water and to start back taking my Pantoprazole I have for acid reflux. I'm  almost finished with it and the burning is still flaring up. She prescribed clotrimazole 10 mg but I haven't used it yet because I wanted to see if just drinking water would help but it didn't, so should I come in to let Dr. Mortimer Martin look at my tongue & throat  to see if it's the inhalers I'm using? I still am having to use my Albuterol inhaler along with my Symbicort one because I'm having some pain over my right breast and it helps to add in my Albuterol inhaler when I do.     Dr. Mortimer Martin, please advise. Thanks

## 2020-05-22 NOTE — Telephone Encounter (Signed)
Do you recommend a ENT in [place]?  Dr. Mortimer Fries, please advise. Thanks

## 2020-05-26 ENCOUNTER — Other Ambulatory Visit: Payer: Self-pay | Admitting: Family Medicine

## 2020-05-26 DIAGNOSIS — M9901 Segmental and somatic dysfunction of cervical region: Secondary | ICD-10-CM | POA: Diagnosis not present

## 2020-05-26 DIAGNOSIS — N6489 Other specified disorders of breast: Secondary | ICD-10-CM

## 2020-05-26 DIAGNOSIS — M180 Bilateral primary osteoarthritis of first carpometacarpal joints: Secondary | ICD-10-CM | POA: Diagnosis not present

## 2020-05-26 DIAGNOSIS — M25511 Pain in right shoulder: Secondary | ICD-10-CM | POA: Diagnosis not present

## 2020-05-26 DIAGNOSIS — R928 Other abnormal and inconclusive findings on diagnostic imaging of breast: Secondary | ICD-10-CM

## 2020-05-26 DIAGNOSIS — G894 Chronic pain syndrome: Secondary | ICD-10-CM | POA: Diagnosis not present

## 2020-05-26 DIAGNOSIS — M9902 Segmental and somatic dysfunction of thoracic region: Secondary | ICD-10-CM | POA: Diagnosis not present

## 2020-05-26 DIAGNOSIS — M9908 Segmental and somatic dysfunction of rib cage: Secondary | ICD-10-CM | POA: Diagnosis not present

## 2020-05-26 DIAGNOSIS — M546 Pain in thoracic spine: Secondary | ICD-10-CM | POA: Diagnosis not present

## 2020-05-26 DIAGNOSIS — M542 Cervicalgia: Secondary | ICD-10-CM | POA: Diagnosis not present

## 2020-05-26 NOTE — Progress Notes (Signed)
Orders signed as requested.

## 2020-05-29 DIAGNOSIS — F411 Generalized anxiety disorder: Secondary | ICD-10-CM | POA: Diagnosis not present

## 2020-05-29 DIAGNOSIS — F3132 Bipolar disorder, current episode depressed, moderate: Secondary | ICD-10-CM | POA: Diagnosis not present

## 2020-06-04 ENCOUNTER — Ambulatory Visit
Admission: RE | Admit: 2020-06-04 | Discharge: 2020-06-04 | Disposition: A | Discharge status: Home / Self Care | Payer: Medicare Other | Source: Ambulatory Visit | Attending: Family Medicine | Admitting: Family Medicine

## 2020-06-04 ENCOUNTER — Other Ambulatory Visit: Payer: Self-pay

## 2020-06-04 DIAGNOSIS — M25561 Pain in right knee: Secondary | ICD-10-CM | POA: Diagnosis not present

## 2020-06-04 DIAGNOSIS — R928 Other abnormal and inconclusive findings on diagnostic imaging of breast: Secondary | ICD-10-CM | POA: Diagnosis not present

## 2020-06-04 DIAGNOSIS — N6489 Other specified disorders of breast: Secondary | ICD-10-CM | POA: Diagnosis not present

## 2020-06-04 DIAGNOSIS — M9906 Segmental and somatic dysfunction of lower extremity: Secondary | ICD-10-CM | POA: Diagnosis not present

## 2020-06-04 DIAGNOSIS — M9903 Segmental and somatic dysfunction of lumbar region: Secondary | ICD-10-CM | POA: Diagnosis not present

## 2020-06-04 DIAGNOSIS — R922 Inconclusive mammogram: Secondary | ICD-10-CM | POA: Diagnosis not present

## 2020-06-04 DIAGNOSIS — M545 Low back pain, unspecified: Secondary | ICD-10-CM | POA: Diagnosis not present

## 2020-06-04 DIAGNOSIS — M9904 Segmental and somatic dysfunction of sacral region: Secondary | ICD-10-CM | POA: Diagnosis not present

## 2020-06-04 DIAGNOSIS — M9905 Segmental and somatic dysfunction of pelvic region: Secondary | ICD-10-CM | POA: Diagnosis not present

## 2020-06-05 DIAGNOSIS — F3132 Bipolar disorder, current episode depressed, moderate: Secondary | ICD-10-CM | POA: Diagnosis not present

## 2020-06-05 DIAGNOSIS — F411 Generalized anxiety disorder: Secondary | ICD-10-CM | POA: Diagnosis not present

## 2020-06-05 DIAGNOSIS — F3181 Bipolar II disorder: Secondary | ICD-10-CM | POA: Diagnosis not present

## 2020-06-09 DIAGNOSIS — K146 Glossodynia: Secondary | ICD-10-CM | POA: Diagnosis not present

## 2020-06-09 DIAGNOSIS — K219 Gastro-esophageal reflux disease without esophagitis: Secondary | ICD-10-CM | POA: Diagnosis not present

## 2020-06-09 DIAGNOSIS — J3489 Other specified disorders of nose and nasal sinuses: Secondary | ICD-10-CM | POA: Diagnosis not present

## 2020-06-09 DIAGNOSIS — H6121 Impacted cerumen, right ear: Secondary | ICD-10-CM | POA: Diagnosis not present

## 2020-06-12 DIAGNOSIS — Z85820 Personal history of malignant melanoma of skin: Secondary | ICD-10-CM | POA: Diagnosis not present

## 2020-06-12 DIAGNOSIS — L578 Other skin changes due to chronic exposure to nonionizing radiation: Secondary | ICD-10-CM | POA: Diagnosis not present

## 2020-06-12 DIAGNOSIS — B009 Herpesviral infection, unspecified: Secondary | ICD-10-CM | POA: Diagnosis not present

## 2020-06-12 DIAGNOSIS — L821 Other seborrheic keratosis: Secondary | ICD-10-CM | POA: Diagnosis not present

## 2020-06-18 DIAGNOSIS — F3132 Bipolar disorder, current episode depressed, moderate: Secondary | ICD-10-CM | POA: Diagnosis not present

## 2020-06-18 DIAGNOSIS — F3181 Bipolar II disorder: Secondary | ICD-10-CM | POA: Diagnosis not present

## 2020-06-18 DIAGNOSIS — F411 Generalized anxiety disorder: Secondary | ICD-10-CM | POA: Diagnosis not present

## 2020-06-24 ENCOUNTER — Telehealth: Payer: Self-pay | Admitting: Family Medicine

## 2020-06-24 DIAGNOSIS — M9903 Segmental and somatic dysfunction of lumbar region: Secondary | ICD-10-CM | POA: Diagnosis not present

## 2020-06-24 DIAGNOSIS — M545 Low back pain, unspecified: Secondary | ICD-10-CM | POA: Diagnosis not present

## 2020-06-24 DIAGNOSIS — M9906 Segmental and somatic dysfunction of lower extremity: Secondary | ICD-10-CM | POA: Diagnosis not present

## 2020-06-24 DIAGNOSIS — M25561 Pain in right knee: Secondary | ICD-10-CM | POA: Diagnosis not present

## 2020-06-24 DIAGNOSIS — M9905 Segmental and somatic dysfunction of pelvic region: Secondary | ICD-10-CM | POA: Diagnosis not present

## 2020-06-24 DIAGNOSIS — M9904 Segmental and somatic dysfunction of sacral region: Secondary | ICD-10-CM | POA: Diagnosis not present

## 2020-06-24 NOTE — Chronic Care Management (AMB) (Signed)
  Chronic Care Management   Note  06/24/2020 Name: Theresa Martin MRN: [id number] DOB: Aug 14, 1955  Theresa Martin is a 65 y.o. year old female who is a primary care patient of Birdie Riddle, Aundra Millet, MD. I reached out to Rose Phi by phone today in response to a referral sent by Theresa Martin's PCP, [name], MD.   Theresa Martin was given information about Chronic Care Management services today including:  1. CCM service includes personalized support from designated clinical staff supervised by her physician, including individualized plan of care and coordination with other care providers 2. 24/7 contact phone numbers for assistance for urgent and routine care needs. 3. Service will only be billed when office clinical staff spend 20 minutes or more in a month to coordinate care. 4. Only one practitioner may furnish and bill the service in a calendar month. 5. The patient may stop CCM services at any time (effective at the end of the month) by phone call to the office staff.   Patient agreed to services and verbal consent obtained.   Follow up plan:   Lauretta Grill [employer] Scheduler

## 2020-06-26 DIAGNOSIS — F3132 Bipolar disorder, current episode depressed, moderate: Secondary | ICD-10-CM | POA: Diagnosis not present

## 2020-06-26 DIAGNOSIS — F411 Generalized anxiety disorder: Secondary | ICD-10-CM | POA: Diagnosis not present

## 2020-07-02 DIAGNOSIS — F3181 Bipolar II disorder: Secondary | ICD-10-CM | POA: Diagnosis not present

## 2020-07-02 DIAGNOSIS — F411 Generalized anxiety disorder: Secondary | ICD-10-CM | POA: Diagnosis not present

## 2020-07-02 DIAGNOSIS — F3132 Bipolar disorder, current episode depressed, moderate: Secondary | ICD-10-CM | POA: Diagnosis not present

## 2020-07-02 NOTE — Progress Notes (Signed)
Chronic Care Management Pharmacy Note  07/03/2020 Name:  Theresa Martin MRN:  [id number] DOB:  26-Jan-1956  Subjective: Theresa Martin is an 65 y.o. year old female who is a primary patient of Tabori, Aundra Millet, MD.  The CCM team was consulted for assistance with disease management and care coordination needs.    Engaged with patient by telephone for initial visit in response to provider referral for pharmacy case management and/or care coordination services.   Consent to Services:  The patient was given the following information about Chronic Care Management services today, agreed to services, and gave verbal consent: 1. CCM service includes personalized support from designated clinical staff supervised by the primary care provider, including individualized plan of care and coordination with other care providers 2. 24/7 contact phone numbers for assistance for urgent and routine care needs. 3. Service will only be billed when office clinical staff spend 20 minutes or more in a month to coordinate care. 4. Only one practitioner may furnish and bill the service in a calendar month. 5.The patient may stop CCM services at any time (effective at the end of the month) by phone call to the office staff. 6. The patient will be responsible for cost sharing (co-pay) of up to 20% of the service fee (after annual deductible is met). Patient agreed to services and consent obtained.  Patient Care Team: [name], MD as PCP - General (Family Medicine) Dalia Heading, CNM (Inactive) as Midwife (Certified Nurse Midwife) Robert Bellow, MD (General Surgery) Ulis Rias, MD (Psychiatry) Penni Bombard, MD as Consulting Physician (Neurology) Sable Feil, MD as Consulting Physician (Gastroenterology) Rushie Chestnut (Psychiatry) [facility]) Dermatology, [facility] (Dermatology) Gerda Diss, DO as Consulting Physician (Family  Medicine) Debbora Presto, NP as Nurse Practitioner (Neurology) Flora Lipps, MD as Consulting Physician (Pulmonary Disease) Madelin Rear, [facility] as Pharmacist (Pharmacist)  Hospital visits: None in previous 6 months  Objective: Lab Results  Component Value Date   CREATININE 0.72 03/03/2020   CREATININE 0.70 02/20/2020   CREATININE 0.75 10/23/2019    Lab Results  Component Value Date   HGBA1C 5.3 09/30/2018   Last diabetic Eye exam: No results found for: HMDIABEYEEXA  Last diabetic Foot exam: No results found for: HMDIABFOOTEX      Component Value Date/Time   CHOL 158 03/03/2020 1204   TRIG 52.0 03/03/2020 1204   HDL 51.90 03/03/2020 1204   CHOLHDL 3 03/03/2020 1204   VLDL 10.4 03/03/2020 1204   LDLCALC 96 03/03/2020 1204   [place] 106 (H) 06/08/2017 1534    Hepatic Function Latest Ref Rng & Units 03/03/2020 09/05/2019 07/27/2019  Total Protein 6.0 - 8.3 g/dL 6.7 7.4 6.7  Albumin 3.5 - 5.2 g/dL 4.1 4.6 3.9  AST 0 - 37 U/L 25 25 22   ALT 0 - 35 U/L 33 21 22  Alk Phosphatase 39 - 117 U/L 80 93 70  Total Bilirubin 0.2 - 1.2 mg/dL 0.3 0.4 0.4  Bilirubin, Direct 0.0 - 0.3 mg/dL 0.1 0.1 0.1    Lab Results  Component Value Date/Time   TSH 1.19 03/03/2020 12:04 PM   TSH 0.94 09/05/2019 11:43 AM    CBC Latest Ref Rng & Units 03/03/2020 09/05/2019 07/27/2019  WBC 4.0 - 10.5 K/uL 7.7 4.9 10.6(H)  Hemoglobin 12.0 - 15.0 g/dL 12.0 12.9 12.3  Hematocrit 36.0 - 46.0 % 36.4 37.9 37.0  Platelets 150.0 - 400.0 K/uL 262.0 339.0 262.0   No results  found for: [facility]   03/2020 DEXA AP Spine L1-L2 04/25/2020 65.3 Normal -0.5 1.100 g/cm2 AP Spine L1-L2 06/20/2017 62.4 Normal -0.2 1.145 g/cm2 AP Spine L1-L2 06/19/2015 60.4 Normal 0.4 1.208 g/cm2   DualFemur Neck Left 04/25/2020 65.3 Osteopenia -2.2 0.739 g/cm2 DualFemur Neck Left 06/20/2017 62.4 Osteopenia -1.8 0.783 g/cm2 DualFemur Neck Left 06/19/2015 60.4 Osteopenia -1.8 0.787 g/cm2   DualFemur Total Mean 04/25/2020 65.3 Osteopenia -1.4  0.834 g/cm2 DualFemur Total Mean 06/20/2017 62.4 Osteopenia -1.1 0.873 g/cm2 DualFemur Total Mean 06/19/2015 60.4 Osteopenia -1.1 0.875 g/cm2   The probability of a major osteoporotic fracture is 29.3% within the next ten years. Hip fracture is 3.8%.  09/2018 US Carotid Bilateral: Minor carotid atherosclerosis. No hemodynamically significant ICA stenosis. Degree of narrowing less than 50% bilaterally by ultrasound criteria.  11/2017 Holter Monitor: NSR; Average HR 53; Less than 1% PVC;s; Less than 1% PAC;s; No significant arrhythmia  Clinical ASCVD: No MI/Stroke diagnosis noted in medical Hx, pt denies this.  The ASCVD Risk score Mikey Bussing DC Jr., et al., 2013) failed to calculate for the following reasons:   The patient has a prior MI or stroke diagnosis    Social History   Tobacco Use  Smoking Status Never Smoker  Smokeless Tobacco Never Used   BP Readings from Last 3 Encounters:  03/03/20 128/70  02/18/20 113/70  10/23/19 128/80   Pulse Readings from Last 3 Encounters:  03/03/20 70  02/18/20 95  10/23/19 85   Wt Readings from Last 3 Encounters:  03/24/20 109 lb (49.4 kg)  03/03/20 109 lb 9.6 oz (49.7 kg)  10/23/19 108 lb 3.2 oz (49.1 kg)    Assessment: Review of patient past medical history, allergies, medications, health status, including review of consultants reports, laboratory and other test data, was performed as part of comprehensive evaluation and provision of chronic care management services.   SDOH:  (Social Determinants of Health) assessments and interventions performed:    CCM Care Plan  No Known Allergies  Medications Reviewed Today    Reviewed by Madelin Rear, [facility] (Pharmacist) on 07/03/20 at 1521  Med List Status: <None>  Medication Order Taking? Sig Documenting Provider Last Dose Status Informant  albuterol (VENTOLIN HFA) 108 (90 Base) MCG/ACT inhaler [id number]  TAKE 2 PUFFS BY MOUTH EVERY 6 HOURS AS NEEDED FOR WHEEZE OR SHORTNESS OF BREATH [name], MD  Active Self  atorvastatin (LIPITOR) 20 MG tablet [id number] Yes TAKE 1 TABLET BY MOUTH EVERY DAY [name], MD Taking Active Self  CALCIUM PO [id number] Yes Take 600 mg by mouth daily. [provider] Taking Active Self  Cholecalciferol (VITAMIN D3 PO) [id number] Yes Take 5,000 Units by mouth. [provider] Taking Active Self  clonazePAM (KLONOPIN) 0.5 MG tablet [id number]  Take 0.5 mg by mouth daily as needed. [provider]  Active Self  clotrimazole (MYCELEX) 10 MG troche [id number]  Take 1 tablet (10 mg total) by mouth 5 (five) times daily. Lucretia Kern, DO  Active   ELDERBERRY PO [id number]  Take by mouth. [provider]  Active Self  escitalopram (LEXAPRO) 10 MG tablet [id number] Yes Take 10 mg by mouth daily. [provider] Taking Active Self  gabapentin (NEURONTIN) 400 MG capsule [id number] Yes Take 1 capsule (400 mg total) by mouth 2 (two) times daily. Penumalli, Earlean Polka, MD Taking Active Self  latanoprost (XALATAN) 0.005 % ophthalmic solution [id number]  Place 1 drop into both eyes at bedtime. [name], MD  Active Self  magnesium gluconate (MAGONATE) 500 MG tablet [id number] Yes Take 500 mg by mouth 2 (two) times daily. [provider] Taking Active Self  Misc Natural Products (JOINT HEALTH PO) [id number]  Take by mouth. [provider]  Active Self  montelukast (SINGULAIR) 10 MG tablet [id number] Yes TAKE 1 TABLET BY MOUTH EVERYDAY AT BEDTIME [name], MD Taking Active   Multiple Vitamins-Minerals (WOMENS MULTIVITAMIN PO) [id number] Yes Take by mouth. [provider] Taking Active Self  Probiotic Product (PROBIOTIC PO) [id number] Yes Take by mouth. [provider] Taking Active Self  QUEtiapine (SEROQUEL XR) 400 MG 24 hr tablet [id number] Yes Take 1 tablet (400 mg total) by mouth at bedtime. Money, Lowry Ram, FNP Taking Active Self  SYMBICORT 160-4.5 MCG/ACT inhaler  [id number] Yes INHALE 2 PUFFS INTO THE LUNGS IN THE MORNING AND AT BEDTIME. Parrett, Fonnie Mu, NP Taking Active   temazepam (RESTORIL) 15 MG capsule [id number] Yes Take 1 capsule (15 mg total) by mouth at bedtime. Money, Lowry Ram, FNP Taking Active Self  triamterene-hydrochlorothiazide (MAXZIDE-25) 37.5-25 MG tablet [id number] Yes TAKE 1 TABLET BY MOUTH EVERY DAY [name], MD Taking Active Self  Med List Note Victoriano Lain, CPhT 01/31/19 2134): Brother helped with meds          Patient Active Problem List   Diagnosis Date Noted  . GERD (gastroesophageal reflux disease) 02/28/2019  . Schizo affective schizophrenia ([place]) 02/01/2019  . Abnormal chest CT 12/11/2018  . Nonallopathic lesion of thoracic region 09/05/2018  . Nonallopathic lesion of rib cage 09/05/2018  . Nonallopathic lesion of cervical region 09/05/2018  . Nonallopathic lesion of lumbosacral region 09/05/2018  . Nonallopathic lesion of sacral region 09/05/2018  . Skin cancer   . Chronic right shoulder pain 03/30/2017  . Right shoulder pain 01/12/2017  . Tendinopathy of rotator cuff 12/20/2016  . Angle-closure glaucoma, severe stage 08/19/2015  . Osteopenia 06/19/2015  . Migraine without aura and without status migrainosus, not intractable 01/13/2015  . High risk medications (not anticoagulants) long-term use 09/26/2014  . Routine general medical examination at a health care facility 07/27/2012  . Hyperlipidemia 09/29/2010  . URETEROPELVIC JUNCTION OBSTRUCTION, CONGENITAL 12/15/2009  . CARDIAC MURMUR 11/26/2008  . MAGNETIC RESONANCE IMAGING, BRAIN, ABNORMAL 10/29/2008  . VENEREAL WART 10/23/2008  . Major depressive disorder, recurrent episode ([place]) 10/23/2008  . Essential hypertension 10/23/2008  . ALLERGIC RHINITIS 10/23/2008  . Asthma 10/23/2008  . RENAL CALCULUS, HX OF 10/23/2008    Immunization History  Administered Date(s) Administered  . Fluad Quad(high Dose 65+) 03/04/2020  .  Influenza,inj,Quad PF,6+ Mos 01/19/2017, 01/26/2019  . Moderna Sars-Covid-2 Vaccination 09/03/2019, 09/24/2019  . Pneumococcal Conjugate-13 01/17/2020  . Pneumococcal Polysaccharide-23 12/25/2018  . Tdap 07/27/2012  . Zoster 09/26/2014  . Zoster Recombinat (Shingrix) 05/04/2018, 10/26/2018   Conditions to be addressed/monitored: HTN HLD Asthma GERD MDD Osteopenia  Care Plan : CCM Pharmacy Care Plan  Updates made by Madelin Rear, [facility] since 07/03/2020 12:00 AM    Problem: HTN HLD Asthma GERD MDD Osteopenia   Priority: High    Long-Range Goal: Disease Management   Start Date: 07/03/2020  Expected End Date: 07/03/2021  This Visit's Progress: On track  Priority: High  Note:   Current Barriers:  . Working towards optimizing inhaler technique with symbicort as well as patient assistance   Pharmacist Clinical Goal(s):  Marland Kitchen Patient will verbalize ability to afford treatment regimen . contact provider office for questions/concerns as evidenced notation of same in electronic health record through collaboration with  PharmD and provider.   Interventions: . 1:1 collaboration with [name], MD regarding development and update of comprehensive plan of care as evidenced by provider attestation and co-signature . Inter-disciplinary care team collaboration (see longitudinal plan of care) . Comprehensive medication review performed; medication list updated in electronic medical record  Hypertension (BP goal <130/80) -Controlled -Current treatment: . Triamterene-HCTZ 37.5-25 mg once daily  -Medications previously tried: was on propranolol 40 mg twice daily through Dr Leta Baptist,   -Current home readings: 130/80s in office  -Denies hypotensive/hypertensive symptoms -Educated on Symptoms of hypotension and importance of maintaining adequate hydration; -Counseled to monitor BP at home as directed, document, and provide log at future appointments -Recommended to continue current  medication  Hyperlipidemia: (LDL goal < 100) -Controlled -Current treatment: . Atorvastatin 20 mg once daily  -Medications previously tried: n/a  -Current dietary patterns: fruit, sometimes a meal replacement, salad. Decaffeinated tea, Gatorade, coconut water.  -Current exercise habits: pickle ball, stationary bike. Has not been as active as preferred due to depression. -Educated on Cholesterol goals;  Benefits of statin for ASCVD risk reduction; -Recommended to continue current medication  Asthma (Goal: control symptoms and prevent exacerbations) Not typically needing to use rescue inhaler, changes in weather such is increased humidity can be a trigger and lead to use. Has been consistent with Symbicort twice daily, previously had some tongue/throat issues and had difficulty inhaling complete dose. Now that she is using chamber, this has gotten much better. Currently swishing/gargling with water after symbicort use but has not been spitting out.  -Controlled -Current treatment  . Albuterol (Ventolin) 2 puffs every 4-6 hours prn . Symbicort 160-4.5 mcg/act 2 puffs into the lungs int he morning and at bedtime - uses with chamber now  . Montelukast 10 mg -Exacerbations requiring treatment in last 6 months: 0 -Patient reports consistent use of maintenance inhaler. Frequency of rescue inhaler use: rare -Counseled on making sure to rinse mouth after using symbicort, however do not swallow and ensure water is spit out. -Recommended to continue current medication Assessed patient finances. Reviewed eligibility on Symbicort, based off of income/insurance seems that patient would qualify - agree with moving forward with application.   Osteopenia (Goal: ensure screening supplementation, safe medication use as needed) Typically taking 600 mg of calcium supplement/day, at times holding off due to constipation effects. Is trying to become more active again, has enjoyed pickle ball a lot previously.  Most likely would not want to start any Rx medication therapy unless officially osteoporotic. Does not have recent fall hx.  -Not ideally controlled -Last DEXA Scan: 04/25/2020  -Patient is a candidate for pharmacologic treatment due to T-Score -1.0 to -2.5 and 10-year risk of major osteoporotic fracture > 20% and T-Score -1.0 to -2.5 and 10-year risk of hip fracture > 3%. -Current treatment  . Calcium 600 mg once daily . Vitamin D3 5000 units daily  -Medications previously tried: none -Recommend 1200 mg of calcium daily from dietary and supplemental sources. Recommend weight-bearing and muscle strengthening exercises for building and maintaining bone density. Consider Vitamin d lab at next PCP visit  Patient Goals/Self-Care Activities . Patient will:  - take medications as prescribed collaborate with provider on medication access solutions target a minimum of 150 minutes of moderate intensity exercise weekly  Follow Up Plan: CPA to complete patient assistance application for symbicort and send to patient.  Pharmacist telephone visit scheduled in six months.  Medication Assistance: Symbicort patient assistance in progress. 300% FPL/$54,360. Patient's preferred pharmacy is:  [id number] [id number]: [phone]: [phone] Follow Up:  Patient agrees to Care Plan and Follow-up.    Future Appointments  Date Time Provider DApache 08/12/2020 11:00 AM PPenni Bombard MD [facility] None  01/15/2021 10:30 AM [facility] CCM PHARMACIST [facility]  04/06/2021  9:00 AM [facility] HEALTH COACH [facility]   JMadelin Rear Pharm.D., BCGP Clinical Pharmacist [place] [phone]

## 2020-07-03 ENCOUNTER — Encounter: Payer: Medicare Other | Admitting: Diagnostic Neuroimaging

## 2020-07-03 ENCOUNTER — Ambulatory Visit (INDEPENDENT_AMBULATORY_CARE_PROVIDER_SITE_OTHER): Payer: Medicare Other

## 2020-07-03 DIAGNOSIS — J4520 Mild intermittent asthma, uncomplicated: Secondary | ICD-10-CM

## 2020-07-03 DIAGNOSIS — I10 Essential (primary) hypertension: Secondary | ICD-10-CM | POA: Diagnosis not present

## 2020-07-03 DIAGNOSIS — E785 Hyperlipidemia, unspecified: Secondary | ICD-10-CM

## 2020-07-03 DIAGNOSIS — M8580 Other specified disorders of bone density and structure, unspecified site: Secondary | ICD-10-CM

## 2020-07-03 NOTE — Patient Instructions (Addendum)
Theresa Martin,  Thank you for talking with me today. I have included our care plan/goals in the following pages.   Please review and call me at [phone] with any questions.  Thanks! Ellin Mayhew, Pharm.D., BCGP Clinical Pharmacist [facility] [place] [phone] Patient Care Plan: CCM Pharmacy Care Plan    Problem Identified: HTN HLD Asthma GERD MDD Osteopenia   Priority: High    Long-Range Goal: Disease Management   Start Date: 07/03/2020  Expected End Date: 07/03/2021  This Visit's Progress: On track  Priority: High  Note:   Current Barriers:  . Working towards optimizing inhaler technique with symbicort as well as patient assistance   Pharmacist Clinical Goal(s):  Marland Kitchen Patient will verbalize ability to afford treatment regimen . contact provider office for questions/concerns as evidenced notation of same in electronic health record through collaboration with PharmD and provider.   Interventions: . 1:1 collaboration with [name], MD regarding development and update of comprehensive plan of care as evidenced by provider attestation and co-signature . Inter-disciplinary care team collaboration (see longitudinal plan of care) . Comprehensive medication review performed; medication list updated in electronic medical record  Hypertension (BP goal <130/80) -Controlled -Current treatment: . Triamterene-HCTZ 37.5-25 mg once daily  -Medications previously tried: was on propranolol 40 mg twice daily through Dr Leta Baptist,   -Current home readings: 130/80s in office  -Denies hypotensive/hypertensive symptoms -Educated on Symptoms of hypotension and importance of maintaining adequate hydration; -Counseled to monitor BP at home as directed, document, and provide log at future appointments -Recommended to continue current medication  Hyperlipidemia: (LDL goal < 100) -Controlled -Current treatment: . Atorvastatin 20 mg  once daily  -Medications previously tried: n/a  -Current dietary patterns: fruit, sometimes a meal replacement, salad. Decaffeinated tea, Gatorade, coconut water.  -Current exercise habits: pickle ball, stationary bike. Has not been as active as preferred due to depression. -Educated on Cholesterol goals;  Benefits of statin for ASCVD risk reduction; -Recommended to continue current medication  Asthma (Goal: control symptoms and prevent exacerbations) Not typically needing to use rescue inhaler, changes in weather such is increased humidity can be a trigger and lead to use. Has been consistent with Symbicort twice daily, previously had some tongue/throat issues and had difficulty inhaling complete dose. Now that she is using chamber, this has gotten much better. Currently swishing/gargling after use with water and swallowing.    -Controlled -Current treatment  . Albuterol (Ventolin) 2 puffs every 4-6 hours prn . Symbicort 160-4.5 mcg/act 2 puffs into the lungs int he morning and at bedtime - uses with chamber now  . Montelukast 10 mg -Exacerbations requiring treatment in last 6 months: 0 -Patient reports consistent use of maintenance inhaler. Frequency of rescue inhaler use: rare -Counseled on making sure to rinse mouth after using symbicort, however do not swallow and ensure water is spit out. -Recommended to continue current medication Assessed patient finances. Reviewed eligibility on Symbicort, based off of income/insurance seems that patient would qualify - agree with moving forward with application.   Osteopenia (Goal: ensure screening supplementation, safe medication use as needed) Typically taking 600 mg of calcium supplement/day, at times holding off due to constipation effects. Is trying to become more active again, has enjoyed pickle ball a lot previously. Most likely would not want to start any Rx medication therapy unless officially osteoporotic. Does not have recent fall  hx.  -Not ideally controlled -Last DEXA Scan: 04/25/2020  -Patient is a  candidate for pharmacologic treatment due to T-Score -1.0 to -2.5 and 10-year risk of major osteoporotic fracture > 20% and T-Score -1.0 to -2.5 and 10-year risk of hip fracture > 3%. -Current treatment  . Calcium 600 mg once daily . Vitamin D3 5000 units daily  -Medications previously tried: none -Recommend 1200 mg of calcium daily from dietary and supplemental sources. Recommend weight-bearing and muscle strengthening exercises for building and maintaining bone density. Consider Vitamin d lab at next PCP visit  Patient Goals/Self-Care Activities . Patient will:  - take medications as prescribed collaborate with provider on medication access solutions target a minimum of 150 minutes of moderate intensity exercise weekly  Follow Up Plan: CPA to complete patient assistance application for symbicort and send to patient.  Pharmacist telephone visit scheduled in six months.  Medication Assistance: Symbicort patient assistance in progress. 300% FPL/$54,360. Patient's preferred pharmacy is:  [id number] [address], [place] [address] Phone: [phone] Fax: [phone]  Follow Up:  Patient agrees to Care Plan and Follow-up.      The patient was given the following information about Chronic Care Management services today, agreed to services, and gave verbal consent: 1. CCM service includes personalized support from designated clinical staff supervised by the primary care provider, including individualized plan of care and coordination with other care providers 2. 24/7 contact phone numbers for assistance for urgent and routine care needs. 3. Service will only be billed when office clinical staff spend 20 minutes or more in a month to coordinate care. 4. Only one practitioner may furnish and bill the service in a calendar month. 5.The patient may stop CCM services at any  time (effective at the end of the month) by phone call to the office staff. 6. The patient will be responsible for cost sharing (co-pay) of up to 20% of the service fee (after annual deductible is met). Patient agreed to services and consent obtained.  The patient verbalized understanding of instructions provided today and agreed to receive a MyChart copy of patient instruction and/or educational materials. Telephone follow up appointment with pharmacy team member scheduled for: See next appointment with "Care Management Staff" under "What's Next" below.  High Cholesterol  High cholesterol is a condition in which the blood has high levels of a white, waxy substance similar to fat (cholesterol). The liver makes all the cholesterol that the body needs. The human body needs small amounts of cholesterol to help build cells. A person gets extra or excess cholesterol from the food that he or she eats. The blood carries cholesterol from the liver to the rest of the body. If you have high cholesterol, deposits (plaques) may build up on the walls of your arteries. Arteries are the blood vessels that carry blood away from your heart. These plaques make the arteries narrow and stiff. Cholesterol plaques increase your risk for heart attack and stroke. Work with your health care provider to keep your cholesterol levels in a healthy range. What increases the risk? The following factors may make you more likely to develop this condition:  Eating foods that are high in animal fat (saturated fat) or cholesterol.  Being overweight.  Not getting enough exercise.  A family history of high cholesterol (familial hypercholesterolemia).  Use of tobacco products.  Having diabetes. What are the signs or symptoms? There are no symptoms of this condition. How is this diagnosed? This condition may be diagnosed based on the results of a  blood test.  If you are older than 66 years of age, your health care provider may  check your cholesterol levels every 4-6 years.  You may be checked more often if you have high cholesterol or other risk factors for heart disease. The blood test for cholesterol measures:  "Bad" cholesterol, or LDL cholesterol. This is the main type of cholesterol that causes heart disease. The desired level is less than 100 mg/dL.  "Good" cholesterol, or HDL cholesterol. HDL helps protect against heart disease by cleaning the arteries and carrying the LDL to the liver for processing. The desired level for HDL is 60 mg/dL or higher.  Triglycerides. These are fats that your body can store or burn for energy. The desired level is less than 150 mg/dL.  Total cholesterol. This measures the total amount of cholesterol in your blood and includes LDL, HDL, and triglycerides. The desired level is less than 200 mg/dL. How is this treated? This condition may be treated with:  Diet changes. You may be asked to eat foods that have more fiber and less saturated fats or added sugar.  Lifestyle changes. These may include regular exercise, maintaining a healthy weight, and quitting use of tobacco products.  Medicines. These are given when diet and lifestyle changes have not worked. You may be prescribed a statin medicine to help lower your cholesterol levels. Follow these instructions at home: Eating and drinking  Eat a healthy, balanced diet. This diet includes: ? Daily servings of a variety of fresh, frozen, or canned fruits and vegetables. ? Daily servings of whole grain foods that are rich in fiber. ? Foods that are low in saturated fats and trans fats. These include poultry and fish without skin, lean cuts of meat, and low-fat dairy products. ? A variety of fish, especially oily fish that contain omega-3 fatty acids. Aim to eat fish at least 2 times a week.  Avoid foods and drinks that have added sugar.  Use healthy cooking methods, such as roasting, grilling, broiling, baking, poaching,  steaming, and stir-frying. Do not fry your food except for stir-frying.   Lifestyle  Get regular exercise. Aim to exercise for a total of 150 minutes a week. Increase your activity level by doing activities such as gardening, walking, and taking the stairs.  Do not use any products that contain nicotine or tobacco, such as cigarettes, e-cigarettes, and chewing tobacco. If you need help quitting, ask your health care provider.   General instructions  Take over-the-counter and prescription medicines only as told by your health care provider.  Keep all follow-up visits as told by your health care provider. This is important. Where to find more information  American Heart Association: www.heart.org  National Heart, Lung, and Blood Institute: https://wilson-eaton.com/ Contact a health care provider if:  You have trouble achieving or maintaining a healthy diet or weight.  You are starting an exercise program.  You are unable to stop smoking. Get help right away if:  You have chest pain.  You have trouble breathing.  You have any symptoms of a stroke. "BE FAST" is an easy way to remember the main warning signs of a stroke: ? B - Balance. Signs are dizziness, sudden trouble walking, or loss of balance. ? E - Eyes. Signs are trouble seeing or a sudden change in vision. ? F - Face. Signs are sudden weakness or numbness of the face, or the face or eyelid drooping on one side. ? A - Arms. Signs are weakness or numbness  in an arm. This happens suddenly and usually on one side of the body. ? S - Speech. Signs are sudden trouble speaking, slurred speech, or trouble understanding what people say. ? T - Time. Time to call emergency services. Write down what time symptoms started.  You have other signs of a stroke, such as: ? A sudden, severe headache with no known cause. ? Nausea or vomiting. ? Seizure. These symptoms may represent a serious problem that is an emergency. Do not wait to see if the  symptoms will go away. Get medical help right away. Call your local emergency services (911 in the U.S.). Do not drive yourself to the hospital. Summary  Cholesterol plaques increase your risk for heart attack and stroke. Work with your health care provider to keep your cholesterol levels in a healthy range.  Eat a healthy, balanced diet, get regular exercise, and maintain a healthy weight.  Do not use any products that contain nicotine or tobacco, such as cigarettes, e-cigarettes, and chewing tobacco.  Get help right away if you have any symptoms of a stroke. This information is not intended to replace advice given to you by your health care provider. Make sure you discuss any questions you have with your health care provider. Document Revised: 02/12/2019 Document Reviewed: 02/12/2019 Elsevier Patient Education  2021 [place].   Osteopenia  Osteopenia is a loss of thickness (density) inside the bones. Another name for osteopenia is low bone mass. Mild osteopenia is a normal part of aging. It is not a disease, and it does not cause symptoms. However, if you have osteopenia and continue to lose bone mass, you could develop a condition that causes the bones to become thin and break more easily (osteoporosis). Osteoporosis can cause you to lose some height, have back pain, and have a stooped posture. Although osteopenia is not a disease, making changes to your lifestyle and diet can help to prevent osteopenia from developing into osteoporosis. What are the causes? Osteopenia is caused by loss of calcium in the bones. Bones are constantly changing. Old bone cells are continually being replaced with new bone cells. This process builds new bone. The mineral calcium is needed to build new bone and maintain bone density. Bone density is usually highest around age 73. After that, most people's bodies cannot replace all the bone they have lost with new bone. What increases the risk? You are more  likely to develop this condition if:  You are older than age 79.  You are a woman who went through menopause early.  You have a long illness that keeps you in bed.  You do not get enough exercise.  You lack certain nutrients (malnutrition).  You have an overactive thyroid gland (hyperthyroidism).  You use products that contain nicotine or tobacco, such as cigarettes, e-cigarettes and chewing tobacco, or you drink a lot of alcohol.  You are taking medicines that weaken the bones, such as steroids. What are the signs or symptoms? This condition does not cause any symptoms. You may have a slightly higher risk for bone breaks (fractures), so getting fractures more easily than normal may be an indication of osteopenia. How is this diagnosed? This condition may be diagnosed based on an X-ray exam that measures bone density (dual-energy X-ray absorptiometry, or DEXA). This test can measure bone density in your hips, spine, and wrists. Osteopenia has no symptoms, so this condition is usually diagnosed after a routine bone density screening test is done for osteoporosis. This routine  screening is usually done for:  Women who are age 98 or older.  Men who are age 22 or older. If you have risk factors for osteopenia, you may have the screening test at an earlier age. How is this treated? Making dietary and lifestyle changes can lower your risk for osteoporosis. If you have severe osteopenia that is close to becoming osteoporosis, this condition can be treated with medicines and dietary supplements such as calcium and vitamin D. These supplements help to rebuild bone density. Follow these instructions at home: Eating and drinking Eat a diet that is high in calcium and vitamin D.  Calcium is found in dairy products, beans, salmon, and leafy green vegetables like spinach and broccoli.  Look for foods that have vitamin D and calcium added to them (fortified foods), such as orange juice, cereal,  and bread.   Lifestyle  Do 30 minutes or more of a weight-bearing exercise every day, such as walking, jogging, or playing a sport. These types of exercises strengthen the bones.  Do not use any products that contain nicotine or tobacco, such as cigarettes, e-cigarettes, and chewing tobacco. If you need help quitting, ask your health care provider.  Do not drink alcohol if: ? Your health care provider tells you not to drink. ? You are pregnant, may be pregnant, or are planning to become pregnant.  If you drink alcohol: ? Limit how much you use to:  0-1 drink a day for women.  0-2 drinks a day for men. ? Be aware of how much alcohol is in your drink. In the [place]., one drink equals one 12 oz bottle of beer (355 mL), one 5 oz glass of wine (148 mL), or one 1 oz glass of hard liquor (44 mL). General instructions  Take over-the-counter and prescription medicines only as told by your health care provider. These include vitamins and supplements.  Take precautions at home to lower your risk of falling, such as: ? Keeping rooms well-lit and free of clutter, such as cords. ? Installing safety rails on stairs. ? Using rubber mats in the bathroom or other areas that are often wet or slippery.  Keep all follow-up visits. This is important. Contact a health care provider if:  You have not had a bone density screening for osteoporosis and you are: ? A woman who is age 53 or older. ? A man who is age 64 or older.  You are a postmenopausal woman who has not had a bone density screening for osteoporosis.  You are older than age 21 and you want to know if you should have bone density screening for osteoporosis. Summary  Osteopenia is a loss of thickness (density) inside the bones. Another name for osteopenia is low bone mass.  Osteopenia is not a disease, but it may increase your risk for a condition that causes the bones to become thin and break more easily (osteoporosis).  You may be at  risk for osteopenia if you are older than age 92 or if you are a woman who went through early menopause.  Osteopenia does not cause any symptoms, but it can be diagnosed with a bone density screening test.  Dietary and lifestyle changes are the first treatment for osteopenia. These may lower your risk for osteoporosis. This information is not intended to replace advice given to you by your health care provider. Make sure you discuss any questions you have with your health care provider. Document Revised: 08/30/2019 Document Reviewed: 08/30/2019 Elsevier Patient Education  [place].

## 2020-07-17 DIAGNOSIS — F3132 Bipolar disorder, current episode depressed, moderate: Secondary | ICD-10-CM | POA: Diagnosis not present

## 2020-07-17 DIAGNOSIS — F3181 Bipolar II disorder: Secondary | ICD-10-CM | POA: Diagnosis not present

## 2020-07-17 DIAGNOSIS — F411 Generalized anxiety disorder: Secondary | ICD-10-CM | POA: Diagnosis not present

## 2020-07-18 DIAGNOSIS — F3110 Bipolar disorder, current episode manic without psychotic features, unspecified: Secondary | ICD-10-CM | POA: Diagnosis not present

## 2020-07-21 DIAGNOSIS — F411 Generalized anxiety disorder: Secondary | ICD-10-CM | POA: Diagnosis not present

## 2020-07-21 DIAGNOSIS — F3132 Bipolar disorder, current episode depressed, moderate: Secondary | ICD-10-CM | POA: Diagnosis not present

## 2020-07-24 ENCOUNTER — Other Ambulatory Visit: Payer: Self-pay | Admitting: Family Medicine

## 2020-07-25 DIAGNOSIS — F3132 Bipolar disorder, current episode depressed, moderate: Secondary | ICD-10-CM | POA: Diagnosis not present

## 2020-07-25 DIAGNOSIS — F3181 Bipolar II disorder: Secondary | ICD-10-CM | POA: Diagnosis not present

## 2020-07-25 DIAGNOSIS — F411 Generalized anxiety disorder: Secondary | ICD-10-CM | POA: Diagnosis not present

## 2020-07-30 DIAGNOSIS — M25561 Pain in right knee: Secondary | ICD-10-CM | POA: Diagnosis not present

## 2020-07-30 DIAGNOSIS — M9904 Segmental and somatic dysfunction of sacral region: Secondary | ICD-10-CM | POA: Diagnosis not present

## 2020-07-30 DIAGNOSIS — M9903 Segmental and somatic dysfunction of lumbar region: Secondary | ICD-10-CM | POA: Diagnosis not present

## 2020-07-30 DIAGNOSIS — M9906 Segmental and somatic dysfunction of lower extremity: Secondary | ICD-10-CM | POA: Diagnosis not present

## 2020-07-30 DIAGNOSIS — M9905 Segmental and somatic dysfunction of pelvic region: Secondary | ICD-10-CM | POA: Diagnosis not present

## 2020-07-30 DIAGNOSIS — M545 Low back pain, unspecified: Secondary | ICD-10-CM | POA: Diagnosis not present

## 2020-08-08 DIAGNOSIS — M9903 Segmental and somatic dysfunction of lumbar region: Secondary | ICD-10-CM | POA: Diagnosis not present

## 2020-08-08 DIAGNOSIS — M9905 Segmental and somatic dysfunction of pelvic region: Secondary | ICD-10-CM | POA: Diagnosis not present

## 2020-08-08 DIAGNOSIS — M9904 Segmental and somatic dysfunction of sacral region: Secondary | ICD-10-CM | POA: Diagnosis not present

## 2020-08-08 DIAGNOSIS — M5451 Vertebrogenic low back pain: Secondary | ICD-10-CM | POA: Diagnosis not present

## 2020-08-08 DIAGNOSIS — F411 Generalized anxiety disorder: Secondary | ICD-10-CM | POA: Diagnosis not present

## 2020-08-08 DIAGNOSIS — M9906 Segmental and somatic dysfunction of lower extremity: Secondary | ICD-10-CM | POA: Diagnosis not present

## 2020-08-08 DIAGNOSIS — F3181 Bipolar II disorder: Secondary | ICD-10-CM | POA: Diagnosis not present

## 2020-08-08 DIAGNOSIS — M542 Cervicalgia: Secondary | ICD-10-CM | POA: Diagnosis not present

## 2020-08-08 DIAGNOSIS — F3132 Bipolar disorder, current episode depressed, moderate: Secondary | ICD-10-CM | POA: Diagnosis not present

## 2020-08-08 DIAGNOSIS — G43011 Migraine without aura, intractable, with status migrainosus: Secondary | ICD-10-CM | POA: Diagnosis not present

## 2020-08-11 ENCOUNTER — Telehealth: Payer: Self-pay | Admitting: Internal Medicine

## 2020-08-11 ENCOUNTER — Telehealth: Payer: Self-pay | Admitting: Primary Care

## 2020-08-11 DIAGNOSIS — F3132 Bipolar disorder, current episode depressed, moderate: Secondary | ICD-10-CM | POA: Diagnosis not present

## 2020-08-11 DIAGNOSIS — F411 Generalized anxiety disorder: Secondary | ICD-10-CM | POA: Diagnosis not present

## 2020-08-11 NOTE — Telephone Encounter (Signed)
We do not have any spacers in the office at this time. We could order one through a DME company, but if she only needs it until tomorrow she would not receive it today.  lmtcb to make patient aware that we don't have spacers.

## 2020-08-11 NOTE — Telephone Encounter (Signed)
Spoke with patient to let her know that we do not currently have any spacers and if we placed an order for one she would have hers back before they got this processed. She expressed understanding. Nothing further needed at this time.

## 2020-08-11 NOTE — Telephone Encounter (Signed)
We do not have samples of Symbicort.  Patient is aware and voiced her understanding. She stated that she would try to locate her inhaler.  Nothing further needed at this time.

## 2020-08-11 NOTE — Telephone Encounter (Signed)
Spoke to patient, who stated she visited with her daughter this weekend and left her symbicort inhaler.  She has 4 refills left and contacted her pharmacy for a refill, however she was advised that insurance would not cover for 5 more days.  She is questioning if our office can override this. She is aware that we are unable to override this and she will need to pay out of pocket for early refill.  Patient sated that she would travel to [place] to pick up her inhaler.  Nothing further needed at this time.

## 2020-08-12 ENCOUNTER — Telehealth: Payer: Self-pay | Admitting: Internal Medicine

## 2020-08-12 ENCOUNTER — Ambulatory Visit: Payer: Medicare Other | Admitting: Diagnostic Neuroimaging

## 2020-08-12 DIAGNOSIS — J4520 Mild intermittent asthma, uncomplicated: Secondary | ICD-10-CM

## 2020-08-12 NOTE — Telephone Encounter (Signed)
Spoke to patient, who is requesting Rx for spacer.   Dr. Mortimer Fries, please advise. Thanks

## 2020-08-13 ENCOUNTER — Telehealth: Payer: Self-pay | Admitting: Internal Medicine

## 2020-08-13 NOTE — Telephone Encounter (Signed)
Please order spacer as requested

## 2020-08-13 NOTE — Telephone Encounter (Signed)
Patient is aware that we do not have any free spacers in office currently. She voiced her understanding.  Nothing further needed.

## 2020-08-13 NOTE — Telephone Encounter (Signed)
Order has been placed to [facility] for spacer. Patient is aware and voiced her understanding.  Nothing further needed at this time.

## 2020-08-19 DIAGNOSIS — F3132 Bipolar disorder, current episode depressed, moderate: Secondary | ICD-10-CM | POA: Diagnosis not present

## 2020-08-19 DIAGNOSIS — F3181 Bipolar II disorder: Secondary | ICD-10-CM | POA: Diagnosis not present

## 2020-08-19 DIAGNOSIS — F411 Generalized anxiety disorder: Secondary | ICD-10-CM | POA: Diagnosis not present

## 2020-08-28 ENCOUNTER — Encounter: Payer: Self-pay | Admitting: Family Medicine

## 2020-08-29 ENCOUNTER — Other Ambulatory Visit: Payer: Self-pay | Admitting: Diagnostic Neuroimaging

## 2020-08-29 DIAGNOSIS — M9905 Segmental and somatic dysfunction of pelvic region: Secondary | ICD-10-CM | POA: Diagnosis not present

## 2020-08-29 DIAGNOSIS — M25561 Pain in right knee: Secondary | ICD-10-CM | POA: Diagnosis not present

## 2020-08-29 DIAGNOSIS — M546 Pain in thoracic spine: Secondary | ICD-10-CM | POA: Diagnosis not present

## 2020-08-29 DIAGNOSIS — M9903 Segmental and somatic dysfunction of lumbar region: Secondary | ICD-10-CM | POA: Diagnosis not present

## 2020-08-29 DIAGNOSIS — M797 Fibromyalgia: Secondary | ICD-10-CM | POA: Diagnosis not present

## 2020-08-29 DIAGNOSIS — M9902 Segmental and somatic dysfunction of thoracic region: Secondary | ICD-10-CM | POA: Diagnosis not present

## 2020-08-29 DIAGNOSIS — M545 Low back pain, unspecified: Secondary | ICD-10-CM | POA: Diagnosis not present

## 2020-08-29 DIAGNOSIS — M9901 Segmental and somatic dysfunction of cervical region: Secondary | ICD-10-CM | POA: Diagnosis not present

## 2020-08-29 DIAGNOSIS — M542 Cervicalgia: Secondary | ICD-10-CM | POA: Diagnosis not present

## 2020-08-29 DIAGNOSIS — M9906 Segmental and somatic dysfunction of lower extremity: Secondary | ICD-10-CM | POA: Diagnosis not present

## 2020-08-29 DIAGNOSIS — M9904 Segmental and somatic dysfunction of sacral region: Secondary | ICD-10-CM | POA: Diagnosis not present

## 2020-08-29 DIAGNOSIS — G43011 Migraine without aura, intractable, with status migrainosus: Secondary | ICD-10-CM | POA: Diagnosis not present

## 2020-09-01 DIAGNOSIS — F3181 Bipolar II disorder: Secondary | ICD-10-CM | POA: Diagnosis not present

## 2020-09-01 DIAGNOSIS — F411 Generalized anxiety disorder: Secondary | ICD-10-CM | POA: Diagnosis not present

## 2020-09-01 DIAGNOSIS — F3132 Bipolar disorder, current episode depressed, moderate: Secondary | ICD-10-CM | POA: Diagnosis not present

## 2020-09-02 NOTE — Telephone Encounter (Signed)
Pt wanted to know if this would be done via a lab appt or would she need an OV? Is this something that would be covered by insurance?  Please advise

## 2020-09-09 DIAGNOSIS — F3132 Bipolar disorder, current episode depressed, moderate: Secondary | ICD-10-CM | POA: Diagnosis not present

## 2020-09-09 DIAGNOSIS — F411 Generalized anxiety disorder: Secondary | ICD-10-CM | POA: Diagnosis not present

## 2020-09-15 ENCOUNTER — Telehealth: Payer: Self-pay | Admitting: Diagnostic Neuroimaging

## 2020-09-15 ENCOUNTER — Ambulatory Visit: Payer: Medicare Other | Admitting: Diagnostic Neuroimaging

## 2020-09-15 NOTE — Telephone Encounter (Signed)
Pt cancelled appt due to stuck in traffic.

## 2020-09-16 ENCOUNTER — Ambulatory Visit (INDEPENDENT_AMBULATORY_CARE_PROVIDER_SITE_OTHER): Payer: Medicare Other | Admitting: Family Medicine

## 2020-09-16 ENCOUNTER — Encounter: Payer: Self-pay | Admitting: Family Medicine

## 2020-09-16 ENCOUNTER — Other Ambulatory Visit: Payer: Self-pay

## 2020-09-16 VITALS — BP 112/70 | HR 75 | Temp 96.4°F | Resp 17 | Ht 61.0 in | Wt 113.0 lb

## 2020-09-16 DIAGNOSIS — G894 Chronic pain syndrome: Secondary | ICD-10-CM | POA: Diagnosis not present

## 2020-09-16 DIAGNOSIS — M9908 Segmental and somatic dysfunction of rib cage: Secondary | ICD-10-CM | POA: Diagnosis not present

## 2020-09-16 DIAGNOSIS — F339 Major depressive disorder, recurrent, unspecified: Secondary | ICD-10-CM | POA: Diagnosis not present

## 2020-09-16 DIAGNOSIS — F259 Schizoaffective disorder, unspecified: Secondary | ICD-10-CM

## 2020-09-16 DIAGNOSIS — M542 Cervicalgia: Secondary | ICD-10-CM | POA: Diagnosis not present

## 2020-09-16 DIAGNOSIS — M25511 Pain in right shoulder: Secondary | ICD-10-CM | POA: Diagnosis not present

## 2020-09-16 DIAGNOSIS — B86 Scabies: Secondary | ICD-10-CM | POA: Diagnosis not present

## 2020-09-16 DIAGNOSIS — F331 Major depressive disorder, recurrent, moderate: Secondary | ICD-10-CM

## 2020-09-16 DIAGNOSIS — M9902 Segmental and somatic dysfunction of thoracic region: Secondary | ICD-10-CM | POA: Diagnosis not present

## 2020-09-16 DIAGNOSIS — M9901 Segmental and somatic dysfunction of cervical region: Secondary | ICD-10-CM | POA: Diagnosis not present

## 2020-09-16 NOTE — Progress Notes (Signed)
   Subjective:    Patient ID: Theresa Martin, female    DOB: [birth year], 65 y.o.   MRN: [id number]  HPI Major depressive disorder- pt is seeing Dr Dorann Ou in [place].  She went through the whole consultation for [place] but b/c there was mention of Bipolar 1 in her history, they did not want to proceed.  Currently on Lexapro and Seroquel.  Pt continues to feel depressed- 'i'm not myself'.  She is interested in GeneSight testing today.  Insurance told her to do testing at PCP office and not psych.  Psych is open to idea of adjusting meds based on results.   Review of Systems For ROS see HPI   This visit occurred during the SARS-CoV-2 public health emergency.  Safety protocols were in place, including screening questions prior to the visit, additional usage of staff PPE, and extensive cleaning of exam room while observing appropriate contact time as indicated for disinfecting solutions.      Objective:   Physical Exam Vitals reviewed.  Constitutional:      General: She is not in acute distress.    Appearance: Normal appearance. She is not ill-appearing.  HENT:     Head: Normocephalic and atraumatic.  Eyes:     Extraocular Movements: Extraocular movements intact.     Conjunctiva/sclera: Conjunctivae normal.  Skin:    General: Skin is warm and dry.  Neurological:     General: No focal deficit present.     Mental Status: She is alert and oriented to person, place, and time.  Psychiatric:        Mood and Affect: Mood normal.        Behavior: Behavior normal.        Thought Content: Thought content normal.          Assessment & Plan:  Recurrent depression/schizo affective- ongoing issue for pt.  She feels she needs a medication adjustment but doesn't know what to even try.  She has a psychiatrist but pt wants some direction from [facility] testing.  She reached out to insurance and they told her this had to be done via PCP.  Called company to get log-in/ordering privileges since  everyone w/ log-in access no longer works here.  This took 32 minutes but was finally able to make contact and get portal access.  Swab collected, FedEx pick up arranged.  Will now await results.

## 2020-09-16 NOTE — Patient Instructions (Signed)
We will get in touch with you regarding your results once they are available Call with any questions or concerns Have a great day!!

## 2020-09-18 DIAGNOSIS — R21 Rash and other nonspecific skin eruption: Secondary | ICD-10-CM | POA: Diagnosis not present

## 2020-09-23 DIAGNOSIS — F3132 Bipolar disorder, current episode depressed, moderate: Secondary | ICD-10-CM | POA: Diagnosis not present

## 2020-09-23 DIAGNOSIS — F411 Generalized anxiety disorder: Secondary | ICD-10-CM | POA: Diagnosis not present

## 2020-09-23 DIAGNOSIS — F3181 Bipolar II disorder: Secondary | ICD-10-CM | POA: Diagnosis not present

## 2020-09-24 ENCOUNTER — Encounter: Payer: Self-pay | Admitting: *Deleted

## 2020-09-25 ENCOUNTER — Telehealth: Payer: Self-pay

## 2020-09-25 DIAGNOSIS — M9908 Segmental and somatic dysfunction of rib cage: Secondary | ICD-10-CM | POA: Diagnosis not present

## 2020-09-25 DIAGNOSIS — M9901 Segmental and somatic dysfunction of cervical region: Secondary | ICD-10-CM | POA: Diagnosis not present

## 2020-09-25 DIAGNOSIS — M797 Fibromyalgia: Secondary | ICD-10-CM | POA: Diagnosis not present

## 2020-09-25 DIAGNOSIS — M25511 Pain in right shoulder: Secondary | ICD-10-CM | POA: Diagnosis not present

## 2020-09-25 DIAGNOSIS — M546 Pain in thoracic spine: Secondary | ICD-10-CM | POA: Diagnosis not present

## 2020-09-25 DIAGNOSIS — M542 Cervicalgia: Secondary | ICD-10-CM | POA: Diagnosis not present

## 2020-09-25 DIAGNOSIS — M9902 Segmental and somatic dysfunction of thoracic region: Secondary | ICD-10-CM | POA: Diagnosis not present

## 2020-09-25 DIAGNOSIS — G894 Chronic pain syndrome: Secondary | ICD-10-CM | POA: Diagnosis not present

## 2020-09-25 NOTE — Telephone Encounter (Signed)
I forwarded her these results directly from the portal.  She indicated she received them and didn't understand them.  I encouraged her to reach out to psych b/c the point of doing this was so that psych could adjust her medication

## 2020-09-25 NOTE — Telephone Encounter (Signed)
Patient called about Genesight test results. I looked through patient chart and did not see any results. Please advise.

## 2020-09-26 ENCOUNTER — Other Ambulatory Visit: Payer: Self-pay | Admitting: Family Medicine

## 2020-10-09 DIAGNOSIS — F3132 Bipolar disorder, current episode depressed, moderate: Secondary | ICD-10-CM | POA: Diagnosis not present

## 2020-10-09 DIAGNOSIS — F3181 Bipolar II disorder: Secondary | ICD-10-CM | POA: Diagnosis not present

## 2020-10-09 DIAGNOSIS — F411 Generalized anxiety disorder: Secondary | ICD-10-CM | POA: Diagnosis not present

## 2020-10-10 DIAGNOSIS — Z20822 Contact with and (suspected) exposure to covid-19: Secondary | ICD-10-CM | POA: Diagnosis not present

## 2020-10-13 ENCOUNTER — Ambulatory Visit: Payer: Medicare Other | Admitting: Diagnostic Neuroimaging

## 2020-10-13 ENCOUNTER — Other Ambulatory Visit: Payer: Self-pay | Admitting: Family Medicine

## 2020-10-14 DIAGNOSIS — F411 Generalized anxiety disorder: Secondary | ICD-10-CM | POA: Diagnosis not present

## 2020-10-14 DIAGNOSIS — M9904 Segmental and somatic dysfunction of sacral region: Secondary | ICD-10-CM | POA: Diagnosis not present

## 2020-10-14 DIAGNOSIS — M9903 Segmental and somatic dysfunction of lumbar region: Secondary | ICD-10-CM | POA: Diagnosis not present

## 2020-10-14 DIAGNOSIS — M9905 Segmental and somatic dysfunction of pelvic region: Secondary | ICD-10-CM | POA: Diagnosis not present

## 2020-10-14 DIAGNOSIS — M797 Fibromyalgia: Secondary | ICD-10-CM | POA: Diagnosis not present

## 2020-10-14 DIAGNOSIS — M9901 Segmental and somatic dysfunction of cervical region: Secondary | ICD-10-CM | POA: Diagnosis not present

## 2020-10-14 DIAGNOSIS — F3132 Bipolar disorder, current episode depressed, moderate: Secondary | ICD-10-CM | POA: Diagnosis not present

## 2020-10-14 DIAGNOSIS — M542 Cervicalgia: Secondary | ICD-10-CM | POA: Diagnosis not present

## 2020-10-14 DIAGNOSIS — G894 Chronic pain syndrome: Secondary | ICD-10-CM | POA: Diagnosis not present

## 2020-10-14 DIAGNOSIS — M546 Pain in thoracic spine: Secondary | ICD-10-CM | POA: Diagnosis not present

## 2020-10-14 DIAGNOSIS — M9902 Segmental and somatic dysfunction of thoracic region: Secondary | ICD-10-CM | POA: Diagnosis not present

## 2020-10-14 DIAGNOSIS — M9906 Segmental and somatic dysfunction of lower extremity: Secondary | ICD-10-CM | POA: Diagnosis not present

## 2020-10-14 DIAGNOSIS — M25561 Pain in right knee: Secondary | ICD-10-CM | POA: Diagnosis not present

## 2020-10-14 DIAGNOSIS — M545 Low back pain, unspecified: Secondary | ICD-10-CM | POA: Diagnosis not present

## 2020-10-23 ENCOUNTER — Telehealth: Payer: Self-pay

## 2020-10-23 NOTE — Telephone Encounter (Signed)
Spoke with patient about gene sight testing. Patient will reach out to [facility] to sign a release form so we can fax over result forms.

## 2020-10-23 NOTE — Telephone Encounter (Signed)
Pt called and wants [place] to send a copy of the GeneSight test to [facility] to Dr Moss Mc Sick. I have informed the patient that she will need to sign a release in order to send medical information.   O do not see the GeneSight test in [facility], Fabi said to call the company and see if we can get a copy of the test   Pt call back [phone]

## 2020-10-23 NOTE — Telephone Encounter (Signed)
Called gene sight and spoke with [place] who faxed over patient's results.

## 2020-10-28 DIAGNOSIS — M9901 Segmental and somatic dysfunction of cervical region: Secondary | ICD-10-CM | POA: Diagnosis not present

## 2020-10-28 DIAGNOSIS — M170 Bilateral primary osteoarthritis of knee: Secondary | ICD-10-CM | POA: Diagnosis not present

## 2020-10-28 DIAGNOSIS — M9906 Segmental and somatic dysfunction of lower extremity: Secondary | ICD-10-CM | POA: Diagnosis not present

## 2020-10-28 DIAGNOSIS — G894 Chronic pain syndrome: Secondary | ICD-10-CM | POA: Diagnosis not present

## 2020-10-28 DIAGNOSIS — M9902 Segmental and somatic dysfunction of thoracic region: Secondary | ICD-10-CM | POA: Diagnosis not present

## 2020-10-28 DIAGNOSIS — M9903 Segmental and somatic dysfunction of lumbar region: Secondary | ICD-10-CM | POA: Diagnosis not present

## 2020-10-28 DIAGNOSIS — M9905 Segmental and somatic dysfunction of pelvic region: Secondary | ICD-10-CM | POA: Diagnosis not present

## 2020-10-28 DIAGNOSIS — M797 Fibromyalgia: Secondary | ICD-10-CM | POA: Diagnosis not present

## 2020-10-28 DIAGNOSIS — M542 Cervicalgia: Secondary | ICD-10-CM | POA: Diagnosis not present

## 2020-10-29 DIAGNOSIS — F3132 Bipolar disorder, current episode depressed, moderate: Secondary | ICD-10-CM | POA: Diagnosis not present

## 2020-10-29 DIAGNOSIS — F411 Generalized anxiety disorder: Secondary | ICD-10-CM | POA: Diagnosis not present

## 2020-10-29 DIAGNOSIS — F3181 Bipolar II disorder: Secondary | ICD-10-CM | POA: Diagnosis not present

## 2020-11-02 ENCOUNTER — Other Ambulatory Visit: Payer: Self-pay | Admitting: Family Medicine

## 2020-11-03 ENCOUNTER — Telehealth: Payer: Self-pay | Admitting: Diagnostic Neuroimaging

## 2020-11-03 NOTE — Telephone Encounter (Signed)
Pt has been scheduled for 11/24/2020, have asked the pt via mychart if she would like to be seen sooner than this.

## 2020-11-03 NOTE — Telephone Encounter (Signed)
Pt called wanting a sooner appt, even if its with the NP. Pt is requesting a all back.

## 2020-11-03 NOTE — Telephone Encounter (Signed)
Pt called wanting a sooner appt, if she has to see the NP she said that will be fine. Pt requesting a call back.

## 2020-11-04 DIAGNOSIS — M9900 Segmental and somatic dysfunction of head region: Secondary | ICD-10-CM | POA: Diagnosis not present

## 2020-11-04 DIAGNOSIS — G4489 Other headache syndrome: Secondary | ICD-10-CM | POA: Diagnosis not present

## 2020-11-04 DIAGNOSIS — M9901 Segmental and somatic dysfunction of cervical region: Secondary | ICD-10-CM | POA: Diagnosis not present

## 2020-11-04 DIAGNOSIS — M797 Fibromyalgia: Secondary | ICD-10-CM | POA: Diagnosis not present

## 2020-11-04 DIAGNOSIS — M542 Cervicalgia: Secondary | ICD-10-CM | POA: Diagnosis not present

## 2020-11-05 ENCOUNTER — Telehealth: Payer: Self-pay | Admitting: Diagnostic Neuroimaging

## 2020-11-05 ENCOUNTER — Telehealth: Payer: Self-pay | Admitting: Internal Medicine

## 2020-11-05 NOTE — Telephone Encounter (Signed)
Pt called stating she is really want ting to get in sooner, she is scheduled on 11/24/2020. Pt states she will see anybody. Pt requesting a call back.

## 2020-11-05 NOTE — Telephone Encounter (Signed)
Called patient and informed her Dr Leta Baptist doesn't have any availability sooner. I can schedule her with Edman Circle NP. She agreed to FU on Thurs. Is aware to bring new insurance cards, arrive 30 minutes early. She  verbalized understanding, appreciation.

## 2020-11-06 ENCOUNTER — Encounter: Payer: Self-pay | Admitting: Adult Health

## 2020-11-06 ENCOUNTER — Ambulatory Visit (INDEPENDENT_AMBULATORY_CARE_PROVIDER_SITE_OTHER): Payer: Medicare Other | Admitting: Adult Health

## 2020-11-06 VITALS — BP 150/73 | HR 68 | Ht 61.0 in | Wt 111.2 lb

## 2020-11-06 DIAGNOSIS — G43011 Migraine without aura, intractable, with status migrainosus: Secondary | ICD-10-CM | POA: Diagnosis not present

## 2020-11-06 MED ORDER — KETOROLAC TROMETHAMINE 60 MG/2ML IM SOLN
30.0000 mg | Freq: Once | INTRAMUSCULAR | Status: AC
  Administered 2020-11-06: 30 mg via INTRAMUSCULAR

## 2020-11-06 NOTE — Progress Notes (Signed)
PATIENT: Theresa Martin DOB: 1956/01/24  REASON FOR VISIT: follow up HISTORY FROM: patient PRIMARY NEUROLOGIST: Dr. Leta Baptist   HISTORY OF PRESENT ILLNESS: Today 11/06/20:  Theresa Martin is a 65 year old female with a history of migraine headaches.  She returns today for follow-up.  She reports that she has had an ongoing headache for 2 weeks.  Reports that it starts on the left side behind the eye.  She reports that she has a tingling sensation that radiates down the face and down the arm into the leg and toes.  She has been taking an over-the-counter medication Tylenol and Advil on a daily basis.  Her sports medicine doctor saw her in the last 2 weeks and did an injection.  She is not sure what the injection was.  His notes is not in epic.  She also reports that she was given a prescription for tramadol.  She states that several years ago she had a severe headache with tingling sensations down the left side of the body and went to the emergency room.  Reports that she had a complete stroke work-up that was negative.  Patient had MRI of the brain with and without contrast in June 2021 that was unremarkable.  She remains on gabapentin 400 mg twice a day.  Reports that this has been controlling her headaches up until the last 2 weeks.   HISTORY UPDATE (08/08/19, VRP): Since last visit, doing well, except past 3 months, has left eye pulling sensation, left eye pain, then left head pain. Some sensations in right eye. Also with mild glaucoma.    UPDATE (03/21/19, VRP): Since last visit, doing well until COVID pandemic. She weaned herself off her psych meds, then mania, psychosis . Then headaches worsened. Admitted to behavioral healthNow psych issues are better, and headache resolved since last month.    UPDATE (01/30/18, VRP): Since last visit, doing worse with HA (14-15 per month). More stress levels than last visit. Burning sensation in scalp continues.   UPDATE (08/24/17, VRP): Since last  visit, doing well. Tolerating propranolol. Avg 4-13 migraine per month. No alleviating or aggravating factors.    Separately, had right thoracic burning pain (no rash) in fall 2018, and was tx'd empirically with acyclovir. Then with right scalp burning pain last week; also with chronic left scalp sensitivity with migraines.   UPDATE 08/24/16: Since last visit, avg 2-3 HA per month. Tolerating meds. No new issues. Mood stable. Weight improved.    UPDATE 02/23/16: Since last visit has ~ 1-5 HA per month. Tolerating propranolol + OTC tylenol/aleve for HA mgmt.    UPDATE 08/19/15: Since last visit, avg 1-10 HA per month. Usually with stress. Some more wt gain noted (~10lbs). Also had a a laser eye procedure for angle closure glaucoma, now improved.   UPDATE 04/15/15: Since last visit, doing well. No more migraine since 02/28/15. Propranolol is helping prevent HA. Mood stable.   UPDATE 01/13/15: Since last visit, having more headaches, and more fluid retention. Now being tapered off gabapentin by psychiatry due to side effects.    UPDATE 09/02/14: Doing well. Avg 3-6 days HA per month. Some HA are mild, some are severe. Taking gabapentin '200mg'$  TID. Works out every day at [employer]. Overall mood is better as well.   UPDATE 05/17/14: Since last visit, patient had some psychiatry issues, was managed by behavioral health, and her headaches significant improved. She was doing well for several years and did not follow-up in our clinic. Patient here  with her father today for this visit. Patient's father notes that her headaches seem to be significantly associated with her stress levels. In the last few months her stress levels have increased significantly. Her stress levels related to her ex-husband and her daughters, and some family issues. Patient having intermittent, almost daily left-sided headaches, with numbness and tingling in the left scalp, typically in the evening when she is home. During the daytime patient  stays active, works out several times a week, and does better. Patient had been on gabapentin 100 mg at bedtime for several years. Her psychiatrist increased this to 2 and then 3 capsules at bedtime. When she did 3 capsules at bedtime, her headache type symptoms paradoxical he worsened. She then went back to taking one capsule at bedtime. Strangely, she reports that she was told she was able to take this medication (gabapentin) as needed as well and for the last few weeks has been taking one capsule at bedtime, followed by 1-2 capsules every hour, throughout the night, taking up to 10-14 capsules in a 12 hour period.    PRIOR HPI (11/04/08 - 06/03/09, VRP): 65 year old right-handed female with history of high blood pressure, depression and anxiety presenting for evaluation of chronic headaches and abnormal MRI scan.  On October 26, 2008 the patient presented to the emergency room for right-sided chest pain and was diagnosed with pneumonia.   At this time her headaches worsened in severity.  Upon review of prior MRIs demonstrating microvascular gliosis, patient was referred to our neurology clinic for futher evaluation. Patient's headaches began in 2005 consisting of a "dull pressure like sensation" over the top of her head. Occasionally these are associated with mild nausea without vomiting as well as intermittent left facial numbness.  Patient had dull nagging low level headaches on a daily basis with daily flareups involving a hot sensation over her scalp.  Sometimes this sensation starts as posterior neck pain that moves up the back of her head. She was initially evaluated with MRI scans in 2005 and 2006 and started on topiramate 100 mg at bedtime.   In addition she was taking Advil and Tylenol over-the-counter almost a daily basis.   During flareups the patient would have to lay down in place ice packs over her eyes and the top of her head.   Patient's headaches are aggravated by lack of sleep or stress. No food  triggers noted.  Her other physicians decided to taper her off of NSAIDs, tylenol and also her topiramate. Patient had MRI of the head and neck, found to have some nonspecific white matter lesions. Patient had lumbar puncture and additional blood testing, but multiple sclerosis was ruled out. Patient was treated with several occipital nerve blocks with good results.  REVIEW OF SYSTEMS: Out of a complete 14 system review of symptoms, the patient complains only of the following symptoms, and all other reviewed systems are negative.  ALLERGIES: No Known Allergies  HOME MEDICATIONS: Outpatient Medications Prior to Visit  Medication Sig Dispense Refill   albuterol (VENTOLIN HFA) 108 (90 Base) MCG/ACT inhaler TAKE 2 PUFFS BY MOUTH EVERY 6 HOURS AS NEEDED FOR WHEEZE OR SHORTNESS OF BREATH 18 g 2   atorvastatin (LIPITOR) 20 MG tablet TAKE 1 TABLET BY MOUTH EVERY DAY 90 tablet 1   CALCIUM PO Take 600 mg by mouth daily.     Cholecalciferol (VITAMIN D3 PO) Take 5,000 Units by mouth.     clonazePAM (KLONOPIN) 0.5 MG tablet Take 0.5 mg by mouth  daily as needed.     ELDERBERRY PO Take by mouth.     escitalopram (LEXAPRO) 10 MG tablet Take 10 mg by mouth daily.     gabapentin (NEURONTIN) 400 MG capsule TAKE 1 CAPSULE (400 MG TOTAL) BY MOUTH 2 (TWO) TIMES DAILY. 60 capsule 12   latanoprost (XALATAN) 0.005 % ophthalmic solution Place 1 drop into both eyes at bedtime. 2.5 mL 12   magnesium gluconate (MAGONATE) 500 MG tablet Take 500 mg by mouth 2 (two) times daily.     Misc Natural Products (JOINT HEALTH PO) Take by mouth.     montelukast (SINGULAIR) 10 MG tablet TAKE 1 TABLET BY MOUTH EVERYDAY AT BEDTIME 30 tablet 5   Multiple Vitamins-Minerals (WOMENS MULTIVITAMIN PO) Take by mouth.     Probiotic Product (PROBIOTIC PO) Take by mouth.     QUEtiapine (SEROQUEL XR) 400 MG 24 hr tablet Take 1 tablet (400 mg total) by mouth at bedtime. 30 tablet 1   SYMBICORT 160-4.5 MCG/ACT inhaler INHALE 2 PUFFS INTO THE LUNGS  IN THE MORNING AND AT BEDTIME. 10.2 each 5   temazepam (RESTORIL) 15 MG capsule Take 1 capsule (15 mg total) by mouth at bedtime. 30 capsule 0   triamterene-hydrochlorothiazide (MAXZIDE-25) 37.5-25 MG tablet TAKE 1 TABLET BY MOUTH EVERY DAY 30 tablet 2   clotrimazole (MYCELEX) 10 MG troche Take 1 tablet (10 mg total) by mouth 5 (five) times daily. 35 Troche 0   No facility-administered medications prior to visit.    PAST MEDICAL HISTORY: Past Medical History:  Diagnosis Date   Allergy    Anxiety    Asthma    Depression    Hyperlipidemia    Hypertension    Migraines    Osteopenia after menopause    Persistent headaches    Pneumonia 2020   Postherpetic neuralgia    Renal disorder    Shingles outbreak 02/2014   Skin cancer    moles on right groin and right buttock.    PAST SURGICAL HISTORY: Past Surgical History:  Procedure Laterality Date   CARDIOVASCULAR STRESS TEST  02/2009   treadmill stress test: Low risk   CESAREAN SECTION  1987, 1989   COLONOSCOPY     2014/2015 [place], normal   EXPLORATORY LAPAROTOMY     laproscopy for infertility   EYE SURGERY Bilateral    laser-correct opening b/tn cornea and iris   MOLE REMOVAL  2017   x 2 moles    FAMILY HISTORY: Family History  Problem Relation Age of Onset   Hypertension Mother    Coronary artery disease Mother    Heart failure Mother    Transient ischemic attack Mother    Hypertension Father    Liver disease Sister        liver failure   Migraines Sister    Arrhythmia Brother    Hypertension Brother    Cancer Neg Hx        colon    Breast cancer Neg Hx     SOCIAL HISTORY: Social History   Socioeconomic History   Marital status: Legally Separated    Spouse name: Not on file   Number of children: 2   Years of education: Not on file   Highest education level: Not on file  Occupational History   Occupation: Control and instrumentation engineer   Occupation: retired  Tobacco Use   Smoking status: Never   Smokeless  tobacco: Never  Vaping Use   Vaping Use: Never used  Substance and Sexual Activity  Alcohol use: No    Alcohol/week: 0.0 standard drinks   Drug use: No   Sexual activity: Not Currently  Other Topics Concern   Not on file  Social History Narrative   Regular exercise- yes, spins daily   Diet: fruits and veggies, water    No caffeine   Lives alone    Social Determinants of Health   Financial Resource Strain: Low Risk    Difficulty of Paying Living Expenses: Not hard at all  Food Insecurity: No Food Insecurity   Worried About Charity fundraiser in the Last Year: Never true   [place] in the Last Year: Never true  Transportation Needs: No Transportation Needs   Lack of Transportation (Medical): No   Lack of Transportation (Non-Medical): No  Physical Activity: Inactive   Days of Exercise per Week: 0 days   Minutes of Exercise per Session: 0 min  Stress: No Stress Concern Present   Feeling of Stress : Not at all  Social Connections: Moderately Isolated   Frequency of Communication with Friends and Family: More than three times a week   Frequency of Social Gatherings with Friends and Family: Once a week   Attends Religious Services: More than 4 times per year   Active Member of [employer] or Organizations: No   Attends Archivist Meetings: Never   Marital Status: Separated  Intimate Partner Violence: Not At Risk   Fear of Current or Ex-Partner: No   Emotionally Abused: No   Physically Abused: No   Sexually Abused: No      PHYSICAL EXAM  Vitals:   11/06/20 1500  BP: (!) 150/73  Pulse: 68  Weight: 111 lb 3.2 oz (50.4 kg)  Height: '5\' 1"'$  (1.549 m)   Body mass index is 21.01 kg/m.  Generalized: Well developed, in no acute distress   Neurological examination  Mentation: Alert oriented to time, place, history taking. Follows all commands speech and language fluent Cranial nerve II-XII: Pupils were equal round reactive to light. Extraocular movements were  full, visual field were full on confrontational test. Facial sensation and strength were normal. Uvula tongue midline. Head turning and shoulder shrug  were normal and symmetric. Motor: The motor testing reveals 5 over 5 strength of all 4 extremities. Good symmetric motor tone is noted throughout.  Sensory: Sensory testing is intact to soft touch on all 4 extremities. No evidence of extinction is noted.  Coordination: Cerebellar testing reveals good finger-nose-finger and heel-to-shin bilaterally.  Gait and station: Gait is normal.  Reflexes: Deep tendon reflexes are symmetric and normal bilaterally.   DIAGNOSTIC DATA (LABS, IMAGING, TESTING) - I reviewed patient records, labs, notes, testing and imaging myself where available.  Lab Results  Component Value Date   WBC 7.7 03/03/2020   HGB 12.0 03/03/2020   HCT 36.4 03/03/2020   MCV 90.6 03/03/2020   PLT 262.0 03/03/2020      Component Value Date/Time   NA 141 03/03/2020 1204   K 4.4 03/03/2020 1204   CL 103 03/03/2020 1204   CO2 30 03/03/2020 1204   GLUCOSE 81 03/03/2020 1204   BUN 13 03/03/2020 1204   CREATININE 0.72 03/03/2020 1204   CREATININE 0.90 06/08/2017 1534   CALCIUM 9.1 03/03/2020 1204   PROT 6.7 03/03/2020 1204   ALBUMIN 4.1 03/03/2020 1204   AST 25 03/03/2020 1204   ALT 33 03/03/2020 1204   ALKPHOS 80 03/03/2020 1204   BILITOT 0.3 03/03/2020 1204   GFRNONAA >60 02/11/2019  [id number]   GFRAA >60 02/11/2019 0702   Lab Results  Component Value Date   CHOL 158 03/03/2020   HDL 51.90 03/03/2020   LDLCALC 96 03/03/2020   TRIG 52.0 03/03/2020   CHOLHDL 3 03/03/2020   Lab Results  Component Value Date   HGBA1C 5.3 09/30/2018   Lab Results  Component Value Date   [id number] 01/16/2010   Lab Results  Component Value Date   TSH 1.19 03/03/2020      ASSESSMENT AND PLAN 65 y.o. year old female  has a past medical history of Allergy, Anxiety, Asthma, Depression, Hyperlipidemia, Hypertension, Migraines,  Osteopenia after menopause, Persistent headaches, Pneumonia (2020), Postherpetic neuralgia, Renal disorder, Shingles outbreak (02/2014), and Skin cancer. here with :  1.  Migraine headache  -Toradol 30 mg IM for acute headache today -Continue gabapentin 40 mg twice a day -Advised if headache frequency and severity do not improve she should let us know -Advised the patient that she should not be taking over-the-counter medication on a daily basis as this will cause a rebound headache.  She should limit this to 2-3 times a week -Follow-up in 6 months or sooner if needed     Ward Givens, MSN, NP-C 11/06/2020, 3:09 PM [facility] [address] [place], [place] [address] [phone]

## 2020-11-06 NOTE — Patient Instructions (Signed)
Toradol 30 mg IM for acute headache today -Continue gabapentin 40 mg twice a day -Please refrain from using over-the-counter medication on a daily basis as this will cause rebound headaches.  Limit use to 2-3 times a week.

## 2020-11-06 NOTE — Progress Notes (Addendum)
Pt here for approintment order for toradol '30mg'$  IM injection.  Under aseptic technique toradol '30mg'$ /23m IM given   L deltoid.  Tolerated well.  Bandaid applied.  Wasted 172m [place] MiloN witness.

## 2020-11-06 NOTE — Addendum Note (Signed)
Addended by: Oliver Hum S on: 11/06/2020 04:00 PM   Modules accepted: Orders

## 2020-11-07 ENCOUNTER — Encounter: Payer: Self-pay | Admitting: Adult Health

## 2020-11-07 NOTE — Telephone Encounter (Signed)
The patient was concerned that there were things listed on her MyChart that had not been done. I called her and let her know the CT ordered by Dr. Mortimer Fries was denied and he wanted to wait until she did the split night sleep study which she cancelled.    Dr. Mortimer Fries the patient wants to know if she still needs to do the CT, sleep study, and PFT that was ordered back in 10/2019. She wants to know when she should make an appt to return to see you

## 2020-11-10 ENCOUNTER — Telehealth: Payer: Self-pay | Admitting: Adult Health

## 2020-11-10 ENCOUNTER — Ambulatory Visit: Payer: Medicare Other | Admitting: Diagnostic Neuroimaging

## 2020-11-10 DIAGNOSIS — H401132 Primary open-angle glaucoma, bilateral, moderate stage: Secondary | ICD-10-CM | POA: Diagnosis not present

## 2020-11-10 NOTE — Telephone Encounter (Signed)
Patient called as well and I called her back. See phone encounter.

## 2020-11-10 NOTE — Telephone Encounter (Signed)
Spoke with the patient.  Discussed options.  She thinks she may have taken Topamax in the past.  She is willing to try the prednisone Dosepak.  I let her know message will be sent to [facility].  Her questions were answered.  She did ask if these were seizures as she has a history of seizures.

## 2020-11-10 NOTE — Telephone Encounter (Signed)
Spoke with patient.  She saw [facility] NP on 8/11.  Patient stated the Toradol helped for a while but then it wore off.  Friday night she was in such pain she almost went to the ER.  She eventually took a Tramadol to see if that helped. Patient stated she has to get ice packs and sit down quietly.  She feels the pain coming on in the morning with movement.  She has a sharp pain on the left side of her head that goes down her arm into her feet.  She can't feel 2 toes on her left foot.  The symptoms are not new.  She is not better today.  She states if anything she is worse than when she saw [place]. I let her know I would speak with Jinny Blossom NP give her a call back. Her questions were answered.  Patient was very appreciative for the call.

## 2020-11-10 NOTE — Telephone Encounter (Signed)
Pt called states she is having severe headaches since her last visit with [place]. Pt Is wanting to come in the office again. Pt requesting a call back.

## 2020-11-10 NOTE — Telephone Encounter (Signed)
Has she ever been on prednisone?  And if so does she tolerate it okay if so we can do a prednisone Dosepak to see if it will break the cycle of her headache.  If she is not willing to try prednisone then we could add on Topamax 50 mg at bedtime for headache prevention to take in addition to the gabapentin for now.  In the future we can wean off the gabapentin

## 2020-11-11 DIAGNOSIS — M9905 Segmental and somatic dysfunction of pelvic region: Secondary | ICD-10-CM | POA: Diagnosis not present

## 2020-11-11 DIAGNOSIS — M9900 Segmental and somatic dysfunction of head region: Secondary | ICD-10-CM | POA: Diagnosis not present

## 2020-11-11 DIAGNOSIS — M9901 Segmental and somatic dysfunction of cervical region: Secondary | ICD-10-CM | POA: Diagnosis not present

## 2020-11-11 DIAGNOSIS — M9902 Segmental and somatic dysfunction of thoracic region: Secondary | ICD-10-CM | POA: Diagnosis not present

## 2020-11-11 DIAGNOSIS — G43011 Migraine without aura, intractable, with status migrainosus: Secondary | ICD-10-CM | POA: Diagnosis not present

## 2020-11-11 DIAGNOSIS — M9907 Segmental and somatic dysfunction of upper extremity: Secondary | ICD-10-CM | POA: Diagnosis not present

## 2020-11-11 DIAGNOSIS — M542 Cervicalgia: Secondary | ICD-10-CM | POA: Diagnosis not present

## 2020-11-11 DIAGNOSIS — M797 Fibromyalgia: Secondary | ICD-10-CM | POA: Diagnosis not present

## 2020-11-11 MED ORDER — PREDNISONE 5 MG PO TABS: 5.0000 mg | ORAL_TABLET | Freq: Every day | ORAL | 0 refills | Status: DC

## 2020-11-11 NOTE — Addendum Note (Signed)
Addended by: Trudie Buckler on: 11/11/2020 10:32 AM   Modules accepted: Orders

## 2020-11-11 NOTE — Telephone Encounter (Signed)
When I saw the patient in clinic last week I consulted with Dr. Jaynee Eagles.  Is less likely that these would represent a seizure since the symptoms have been ongoing and they start with a headache.  More likely a complicated migraine.  The patient can try a prednisone Dosepak in the future we may add on Topamax

## 2020-11-12 DIAGNOSIS — H401132 Primary open-angle glaucoma, bilateral, moderate stage: Secondary | ICD-10-CM | POA: Diagnosis not present

## 2020-11-12 NOTE — Telephone Encounter (Signed)
I have spoke with the patient and she now has an appt with Tammy Parrett on 12/18/20 @ 11:30am to discuss sleep study needs and the CT that was denied by Beverlee Nims, MD  You 2 days ago   Lets plan to re-assess her needs at next OV with me or any other provider

## 2020-11-13 ENCOUNTER — Telehealth: Payer: Self-pay | Admitting: Neurology

## 2020-11-13 ENCOUNTER — Telehealth: Payer: Self-pay | Admitting: Adult Health

## 2020-11-13 NOTE — Telephone Encounter (Signed)
Spoke with patient and advised to try gabapentin 400 mg 3 times daily per Dr. Krista Blue.  Patient verbalized understanding and appreciation.  Her questions were answered. Patient aware if she worsens or has concerning symptoms she should go to the ER. Patient aware we also have an on-call physician available if needed.

## 2020-11-13 NOTE — Telephone Encounter (Signed)
Pt states she is having bad leg cramps, left side of the head & tongue are numb.  Please call pt to advise what to do.

## 2020-11-13 NOTE — Telephone Encounter (Signed)
She called answering service that she was having a severe headache.  I called back and Left message to go the ED if very severe otherwise to call our office back tomorrow

## 2020-11-13 NOTE — Telephone Encounter (Signed)
I spoke with Dr Krista Blue. She has advised for patient to try a higher dose of Gabapentin and now take 400 mg TID instead of BID.

## 2020-11-13 NOTE — Telephone Encounter (Signed)
I called the patient. She stated she has been having these headaches for 2 weeks.  She saw Jinny Blossom last week and received a Toradol shot, and was placed on prednisone steroid Dosepak.  She states this afternoon she was tired and laying down to rest and then woke up with severe leg cramps (which are not new).  Pain starts in the bottom of her feet and spikes up.  She also states she has numbness and headache.  The numbness usually happens with a headache.  The only thing that is different this time is that her tongue is now included.  She believes it may be just the left side of the tongue but she is not quite sure.  She is on day 3 of the steroid Dosepak (4 tablets). I advised a message would be sent to the [place].

## 2020-11-14 ENCOUNTER — Telehealth: Payer: Self-pay | Admitting: Neurology

## 2020-11-14 MED ORDER — INDOMETHACIN 25 MG PO CAPS: ORAL_CAPSULE | ORAL | 0 refills | Status: DC

## 2020-11-14 NOTE — Telephone Encounter (Signed)
She has a continued headache and re-called the office/answering service.  Pain is left occipital to temple to forehead.  No nausea or much photophobia.   Toradol helped some.  I prescribed indomethacin prn.  Advised to go to urgent care or the ED if HA is worsening

## 2020-11-16 ENCOUNTER — Encounter: Payer: Self-pay | Admitting: Adult Health

## 2020-11-17 ENCOUNTER — Other Ambulatory Visit: Payer: Self-pay

## 2020-11-17 ENCOUNTER — Encounter: Payer: Self-pay | Admitting: Diagnostic Neuroimaging

## 2020-11-17 ENCOUNTER — Ambulatory Visit (INDEPENDENT_AMBULATORY_CARE_PROVIDER_SITE_OTHER): Payer: Medicare Other | Admitting: Diagnostic Neuroimaging

## 2020-11-17 VITALS — BP 134/86 | HR 78 | Ht 61.0 in | Wt 112.5 lb

## 2020-11-17 DIAGNOSIS — G43109 Migraine with aura, not intractable, without status migrainosus: Secondary | ICD-10-CM

## 2020-11-17 MED ORDER — NURTEC 75 MG PO TBDP: 75.0000 mg | ORAL_TABLET | Freq: Every day | ORAL | 6 refills | Status: DC | PRN

## 2020-11-17 MED ORDER — AJOVY 225 MG/1.5ML ~~LOC~~ SOSY: 225.0000 mg | PREFILLED_SYRINGE | SUBCUTANEOUS | 4 refills | Status: DC

## 2020-11-17 MED ORDER — RIZATRIPTAN BENZOATE 10 MG PO TBDP: 10.0000 mg | ORAL_TABLET | ORAL | 11 refills | Status: DC | PRN

## 2020-11-17 NOTE — Patient Instructions (Addendum)
  MIGRAINE PREVENTION  LIFESTYLE CHANGES -Stop or avoid smoking -Decrease or avoid caffeine / alcohol -Eat and sleep on a regular schedule -Exercise several times per week - start fremanezumab (Ajovy) '225mg'$  monthly - reduce gabapentin to '400mg'$  twice a day    MIGRAINE RESCUE  - ibuprofen, tylenol as needed - stop indomethacin - start rizatriptan (Maxalt) '10mg'$  as needed for breakthrough headache; may repeat x 1 after 2 hours; max 2 tabs per day or 8 per month - start rimegepant (Nurtec) '75mg'$  as needed for breakthrough headache; max 8 per month   Schizoaffective schizophrenia / anxiety / depression (per psychiatry) - continue seroquel '450mg'$  at bedtime

## 2020-11-17 NOTE — Telephone Encounter (Signed)
Spoke with MM NP.  Would like Dr. Leta Baptist to see patient for this appt.   Spoke with the patient.  She stated the indomethacin is not helping (can only take it twice daily) and neither is the gabapentin dosage increase that was ordered last Thursday.  She states she will be fine and then she will have a headache spike.  She would like to be seen and was very appreciative to get in with Dr. Leta Baptist.  I have her scheduled to see him at 3 PM today arrival 2:30-2:45.

## 2020-11-17 NOTE — Progress Notes (Signed)
[facility]  PATIENT: Theresa Martin DOB: 04/23/1955  REFERRING CLINICIAN:  HISTORY FROM: patient REASON FOR VISIT: follow up   HISTORICAL  CHIEF COMPLAINT:  Chief Complaint  Patient presents with   Follow-up    Rm 6 alone here for f/u h/a. Reports medications are not helping.     HISTORY OF PRESENT ILLNESS:   UPDATE (11/17/20, VRP): Since last visit, doing well until 3 weeks ago; returned from vacation with family (camper Lucianne Lei; fun but stressful). Then almost daily left sided throbbing HA, left side numbness. Tried gabapentin increase, prednisone, indomethacin; no relief. Phonophobia. No nausea.   UPDATE (11/06/20, MM): 65 year old female with a history of migraine headaches.  She returns today for follow-up.  She reports that she has had an ongoing headache for 2 weeks.  Reports that it starts on the left side behind the eye.  She reports that she has a tingling sensation that radiates down the face and down the arm into the leg and toes.  She has been taking an over-the-counter medication Tylenol and Advil on a daily basis.  Her sports medicine doctor saw her in the last 2 weeks and did an injection.  She is not sure what the injection was.  His notes is not in epic.  She also reports that she was given a prescription for tramadol.  She states that several years ago she had a severe headache with tingling sensations down the left side of the body and went to the emergency room.  Reports that she had a complete stroke work-up that was negative.  Patient had MRI of the brain with and without contrast in June 2021 that was unremarkable.  She remains on gabapentin 400 mg twice a day.  Reports that this has been controlling her headaches up until the last 2 weeks.  UPDATE (08/08/19, VRP): Since last visit, doing well, except past 3 months, has left eye pulling sensation, left eye pain, then left head pain. Some sensations in right eye. Also with mild glaucoma.   UPDATE  (03/21/19, VRP): Since last visit, doing well until COVID pandemic. She weaned herself off her psych meds, then mania, psychosis . Then headaches worsened. Admitted to behavioral health. Now psych issues are better, and headache resolved since last month.   UPDATE (01/30/18, VRP): Since last visit, doing worse with HA (14-15 per month). More stress levels than last visit. Burning sensation in scalp continues.  UPDATE (08/24/17, VRP): Since last visit, doing well. Tolerating propranolol. Avg 4-13 migraine per month. No alleviating or aggravating factors.   Separately, had right thoracic burning pain (no rash) in fall 2018, and was tx'd empirically with acyclovir. Then with right scalp burning pain last week; also with chronic left scalp sensitivity with migraines.  UPDATE 08/24/16: Since last visit, avg 2-3 HA per month. Tolerating meds. No new issues. Mood stable. Weight improved.   UPDATE 02/23/16: Since last visit has ~ 1-5 HA per month. Tolerating propranolol + OTC tylenol/aleve for HA mgmt.   UPDATE 08/19/15: Since last visit, avg 1-10 HA per month. Usually with stress. Some more wt gain noted (~10lbs). Also had a a laser eye procedure for angle closure glaucoma, now improved.  UPDATE 04/15/15: Since last visit, doing well. No more migraine since 02/28/15. Propranolol is helping prevent HA. Mood stable.  UPDATE 01/13/15: Since last visit, having more headaches, and more fluid retention. Now being tapered off gabapentin by psychiatry due to side effects.   UPDATE 09/02/14: Doing well. Avg 3-6  days HA per month. Some HA are mild, some are severe. Taking gabapentin '200mg'$  TID. Works out every day at [employer]. Overall mood is better as well.  UPDATE 05/17/14: Since last visit, patient had some psychiatry issues, was managed by behavioral health, and her headaches significant improved. She was doing well for several years and did not follow-up in our clinic. Patient here with her father today for this  visit. Patient's father notes that her headaches seem to be significantly associated with her stress levels. In the last few months her stress levels have increased significantly. Her stress levels related to her ex-husband and her daughters, and some family issues. Patient having intermittent, almost daily left-sided headaches, with numbness and tingling in the left scalp, typically in the evening when she is home. During the daytime patient stays active, works out several times a week, and does better. Patient had been on gabapentin 100 mg at bedtime for several years. Her psychiatrist increased this to 2 and then 3 capsules at bedtime. When she did 3 capsules at bedtime, her headache type symptoms paradoxical he worsened. She then went back to taking one capsule at bedtime. Strangely, she reports that she was told she was able to take this medication (gabapentin) as needed as well and for the last few weeks has been taking one capsule at bedtime, followed by 1-2 capsules every hour, throughout the night, taking up to 10-14 capsules in a 12 hour period.   PRIOR HPI (11/04/08 - 06/03/09, VRP): 65 year old right-handed female with history of high blood pressure, depression and anxiety presenting for evaluation of chronic headaches and abnormal MRI scan.  On October 26, 2008 the patient presented to the emergency room for right-sided chest pain and was diagnosed with pneumonia.   At this time her headaches worsened in severity.  Upon review of prior MRIs demonstrating microvascular gliosis, patient was referred to our neurology clinic for futher evaluation. Patient's headaches began in 2005 consisting of a "dull pressure like sensation" over the top of her head. Occasionally these are associated with mild nausea without vomiting as well as intermittent left facial numbness.  Patient had dull nagging low level headaches on a daily basis with daily flareups involving a hot sensation over her scalp.  Sometimes this sensation  starts as posterior neck pain that moves up the back of her head. She was initially evaluated with MRI scans in 2005 and 2006 and started on topiramate 100 mg at bedtime.   In addition she was taking Advil and Tylenol over-the-counter almost a daily basis.   During flareups the patient would have to lay down in place ice packs over her eyes and the top of her head.   Patient's headaches are aggravated by lack of sleep or stress. No food triggers noted.  Her other physicians decided to taper her off of NSAIDs, tylenol and also her topiramate. Patient had MRI of the head and neck, found to have some nonspecific white matter lesions. Patient had lumbar puncture and additional blood testing, but multiple sclerosis was ruled out. Patient was treated with several occipital nerve blocks with good results.   REVIEW OF SYSTEMS: Full 14 system review of systems performed and negative except for: as per HPI.   ALLERGIES: No Known Allergies  HOME MEDICATIONS: Outpatient Medications Prior to Visit  Medication Sig Dispense Refill   albuterol (VENTOLIN HFA) 108 (90 Base) MCG/ACT inhaler TAKE 2 PUFFS BY MOUTH EVERY 6 HOURS AS NEEDED FOR WHEEZE OR SHORTNESS OF BREATH 18  g 2   atorvastatin (LIPITOR) 20 MG tablet TAKE 1 TABLET BY MOUTH EVERY DAY 90 tablet 1   CALCIUM PO Take 1,300 mg by mouth daily.     Cholecalciferol (VITAMIN D3 PO) Take 5,000 Units by mouth.     clonazePAM (KLONOPIN) 0.5 MG tablet Take 0.5 mg by mouth daily as needed.     ELDERBERRY PO Take by mouth.     escitalopram (LEXAPRO) 10 MG tablet Take 10 mg by mouth daily.     gabapentin (NEURONTIN) 400 MG capsule TAKE 1 CAPSULE (400 MG TOTAL) BY MOUTH 2 (TWO) TIMES DAILY. (Patient taking differently: Take 400 mg by mouth 3 (three) times daily.) 60 capsule 12   Glucosamine-Chondroitin (MOVE FREE PO) Take by mouth daily.     indomethacin (INDOCIN) 25 MG capsule Take one pill po once or twice a day prn headahce 30 capsule 0   latanoprost (XALATAN) 0.005  % ophthalmic solution Place 1 drop into both eyes at bedtime. 2.5 mL 12   Misc Natural Products (JOINT HEALTH PO) Take by mouth.     montelukast (SINGULAIR) 10 MG tablet TAKE 1 TABLET BY MOUTH EVERYDAY AT BEDTIME 30 tablet 5   Multiple Vitamins-Minerals (WOMENS MULTIVITAMIN PO) Take by mouth.     Probiotic Product (PROBIOTIC PO) Take by mouth.     QUEtiapine (SEROQUEL XR) 400 MG 24 hr tablet Take 1 tablet (400 mg total) by mouth at bedtime. (Patient taking differently: Take 450 mg by mouth at bedtime.) 30 tablet 1   SYMBICORT 160-4.5 MCG/ACT inhaler INHALE 2 PUFFS INTO THE LUNGS IN THE MORNING AND AT BEDTIME. 10.2 each 5   temazepam (RESTORIL) 15 MG capsule Take 1 capsule (15 mg total) by mouth at bedtime. 30 capsule 0   triamterene-hydrochlorothiazide (MAXZIDE-25) 37.5-25 MG tablet TAKE 1 TABLET BY MOUTH EVERY DAY 30 tablet 2   UNABLE TO FIND Med Name: magnesium L theronate  650 mg at HS     predniSONE (DELTASONE) 5 MG tablet Take 1 tablet (5 mg total) by mouth daily with breakfast. Begin taking 6 tablets daily, taper by one tablet daily until off the medication. (Patient not taking: Reported on 11/17/2020) 21 tablet 0   No facility-administered medications prior to visit.    PAST MEDICAL HISTORY: Past Medical History:  Diagnosis Date   Allergy    Anxiety    Asthma    Depression    Hyperlipidemia    Hypertension    Migraines    Osteopenia after menopause    Persistent headaches    Pneumonia 2020   Postherpetic neuralgia    Renal disorder    Shingles outbreak 02/2014   Skin cancer    moles on right groin and right buttock.    PAST SURGICAL HISTORY: Past Surgical History:  Procedure Laterality Date   CARDIOVASCULAR STRESS TEST  02/2009   treadmill stress test: Low risk   CESAREAN SECTION  1987, 1989   COLONOSCOPY     2014/2015 [place], normal   EXPLORATORY LAPAROTOMY     laproscopy for infertility   EYE SURGERY Bilateral    laser-correct opening b/tn cornea and iris    MOLE REMOVAL  2017   x 2 moles    FAMILY HISTORY: Family History  Problem Relation Age of Onset   Hypertension Mother    Coronary artery disease Mother    Heart failure Mother    Transient ischemic attack Mother    Hypertension Father    Liver disease Sister  liver failure   Migraines Sister    Arrhythmia Brother    Hypertension Brother    Cancer Neg Hx        colon    Breast cancer Neg Hx     SOCIAL HISTORY:  Social History   Socioeconomic History   Marital status: Legally Separated    Spouse name: Not on file   Number of children: 2   Years of education: Not on file   Highest education level: Not on file  Occupational History   Occupation: Control and instrumentation engineer   Occupation: retired  Tobacco Use   Smoking status: Never   Smokeless tobacco: Never  Scientific laboratory technician Use: Never used  Substance and Sexual Activity   Alcohol use: No    Alcohol/week: 0.0 standard drinks   Drug use: No   Sexual activity: Not Currently  Other Topics Concern   Not on file  Social History Narrative   Regular exercise- yes, spins daily   Diet: fruits and veggies, water    No caffeine   Lives alone    Social Determinants of Health   Financial Resource Strain: Low Risk    Difficulty of Paying Living Expenses: Not hard at all  Food Insecurity: No Food Insecurity   Worried About Charity fundraiser in the Last Year: Never true   [place] in the Last Year: Never true  Transportation Needs: No Transportation Needs   Lack of Transportation (Medical): No   Lack of Transportation (Non-Medical): No  Physical Activity: Inactive   Days of Exercise per Week: 0 days   Minutes of Exercise per Session: 0 min  Stress: No Stress Concern Present   Feeling of Stress : Not at all  Social Connections: Moderately Isolated   Frequency of Communication with Friends and Family: More than three times a week   Frequency of Social Gatherings with Friends and Family: Once a week   Attends  Religious Services: More than 4 times per year   Active Member of [employer] or Organizations: No   Attends Archivist Meetings: Never   Marital Status: Separated  Intimate Partner Violence: Not At Risk   Fear of Current or Ex-Partner: No   Emotionally Abused: No   Physically Abused: No   Sexually Abused: No     PHYSICAL EXAM  Vitals:   11/17/20 1457  BP: 134/86  Pulse: 78  SpO2: 98%  Weight: 112 lb 8 oz (51 kg)  Height: '5\' 1"'$  (1.549 m)   Wt Readings from Last 3 Encounters:  11/17/20 112 lb 8 oz (51 kg)  11/06/20 111 lb 3.2 oz (50.4 kg)  09/16/20 113 lb (51.3 kg)   Body mass index is 21.26 kg/m.  No results found.  MMSE - Mini Mental State Exam 06/03/2016  Orientation to time 5  Orientation to Place 5  Registration 3  Attention/ Calculation 5  Recall 3  Language- name 2 objects 2  Language- repeat 1  Language- follow 3 step command 3  Language- read & follow direction 1  Write a sentence 1  Copy design 1  Total score 30    GENERAL EXAM: Patient is in no distress; well developed, nourished and groomed; neck is supple  CARDIOVASCULAR: Regular rate and rhythm, no murmurs, no carotid bruits; BRADYCARDIA  NEUROLOGIC: MENTAL STATUS: awake, alert, language fluent, comprehension intact, naming intact, fund of knowledge appropriate CRANIAL NERVE: pupils equal and reactive to light, visual fields full to confrontation, extraocular muscles intact,  no nystagmus, facial sensation and strength symmetric, hearing intact, palate elevates symmetrically, uvula midline, shoulder shrug symmetric, tongue midline. MOTOR: normal bulk and tone, full strength in the BUE, BLE SENSORY: normal and symmetric to light touch, temperature, vibration COORDINATION: finger-nose-finger, fine finger movements normal REFLEXES: deep tendon reflexes present and symmetric GAIT/STATION: narrow based gait; romberg is negative    DIAGNOSTIC DATA (LABS, IMAGING, TESTING) - I reviewed patient  records, labs, notes, testing and imaging myself where available.  Lab Results  Component Value Date   WBC 7.7 03/03/2020   HGB 12.0 03/03/2020   HCT 36.4 03/03/2020   MCV 90.6 03/03/2020   PLT 262.0 03/03/2020      Component Value Date/Time   NA 141 03/03/2020 1204   K 4.4 03/03/2020 1204   CL 103 03/03/2020 1204   CO2 30 03/03/2020 1204   GLUCOSE 81 03/03/2020 1204   BUN 13 03/03/2020 1204   CREATININE 0.72 03/03/2020 1204   CREATININE 0.90 06/08/2017 1534   CALCIUM 9.1 03/03/2020 1204   PROT 6.7 03/03/2020 1204   ALBUMIN 4.1 03/03/2020 1204   AST 25 03/03/2020 1204   ALT 33 03/03/2020 1204   ALKPHOS 80 03/03/2020 1204   BILITOT 0.3 03/03/2020 1204   GFRNONAA >60 02/11/2019 0702   GFRAA >60 02/11/2019 0702   Lab Results  Component Value Date   CHOL 158 03/03/2020   HDL 51.90 03/03/2020   LDLCALC 96 03/03/2020   TRIG 52.0 03/03/2020   CHOLHDL 3 03/03/2020   Lab Results  Component Value Date   HGBA1C 5.3 09/30/2018   Lab Results  Component Value Date   VITAMINB12 782 01/16/2010   Lab Results  Component Value Date   TSH 1.19 03/03/2020    11/13/08 LP - opening pressure 7cm H2O; WBC 1, RBC 0, glucose 62, protein 26, OCB (2 in CSF, not seen in serum), IgG index 0.5 (normal), lyme PCR neg, EBV PCR neg  11/13/08 VEP - normal  11/08/08 MRI cervical - normal  11/08/08 MRI brain (with and without contrast) - multiple supratentorial, periventricular and juxtacortical white matter lesions which may represent chronic demyelinating plaques or perivascular gliosis.  No abnormal enhancement on postcontrast views.   08/28/17 MRI brain  1.  Scattered T2/FLAIR hyperintense foci predominantly in the deep and subcortical white matter.  This is a nonspecific finding and most likely represents chronic microvascular ischemic change.  The pattern is not typical for demyelination.  The foci are not acute and they do not enhance after contrast.  When compared to the MRI dated 05/20/2004,  there has been only slight progression in the number or size of the foci. 2.  There is a normal enhancement pattern and there are no acute findings.   09/30/18 MRI brain [I reviewed images myself and agree with interpretation. -VRP]  - No acute or subacute infarction. - Numerous scattered foci of T2 and FLAIR signal within the cerebral hemispheric deep and subcortical white matter. The differential diagnosis on the basis of the imaging would be demyelinating disease versus small-vessel disease. Findings appear stable since June of last year but are progressive since 2006. Given the history of hypertension and hyperlipidemia, small-vessel disease is probably more likely  09/21/19 MRI brain - Abnormal MRI scan of the brain showing bilateral nonspecific periventricular and subcortical white matter hyperintensities with a differential discussed above.  No enhancing lesions are noted.  There are mild incidental changes of chronic paranasal sinus inflammation.  Overall no significant change compared with previous MRI from  09/30/2018    ASSESSMENT AND PLAN  65 y.o. year old female here with mixed tension and migraine headaches, left occipital neuralgia, and longer standing significant depression/anxiety. Some headaches have occipital neuralgia type features. Symptoms seem to be worse with stress and external factors.   Meds tried: gabapentin, propranolol, indomethacin, ibuprofen, tylenol, toradol  Dx:  Migraine with aura and without status migrainosus, not intractable    PLAN:  MIGRAINE WITH AURA (history of angle closure glaucoma; cannot use topiramate)  MIGRAINE PREVENTION  LIFESTYLE CHANGES -Stop or avoid smoking -Decrease or avoid caffeine / alcohol -Eat and sleep on a regular schedule -Exercise several times per week - fremanezumab (Ajovy) '225mg'$  monthly (or '675mg'$  every 3 months) - reduce gabapentin to '400mg'$  twice a day   MIGRAINE RESCUE  - ibuprofen, tylenol as needed - rizatriptan  (Maxalt) '10mg'$  as needed for breakthrough headache; may repeat x 1 after 2 hours; max 2 tabs per day or 8 per month - rimegepant (Nurtec) '75mg'$  as needed for breakthrough headache; max 8 per month  Schizoaffective schizophrenia / anxiety / depression (per psychiatry) - continue seroquel '450mg'$  at bedtime  Meds ordered this encounter  Medications   Fremanezumab-vfrm (AJOVY) 225 MG/1.5ML SOSY    Sig: Inject 225 mg into the skin every 30 (thirty) days.    Dispense:  4.5 mL    Refill:  4   rizatriptan (MAXALT-MLT) 10 MG disintegrating tablet    Sig: Take 1 tablet (10 mg total) by mouth as needed for migraine. May repeat in 2 hours if needed    Dispense:  9 tablet    Refill:  11   Rimegepant Sulfate (NURTEC) 75 MG TBDP    Sig: Take 75 mg by mouth daily as needed.    Dispense:  8 tablet    Refill:  6   Return in about 6 months (around 05/20/2021).     Penni Bombard, MD [id number], 0000000 PM Certified in Neurology, Neurophysiology and Neuroimaging  [facility] [address] [place], [place] [address] [phone]

## 2020-11-17 NOTE — Telephone Encounter (Signed)
Pt called stating she is still having really bad headaches. States the medicine that was called in for her on 11/14/2020 did not help her. Pt is requesting a call back.

## 2020-11-17 NOTE — Telephone Encounter (Signed)
Pt also called this am, will address in other phone note

## 2020-11-18 ENCOUNTER — Telehealth: Payer: Self-pay

## 2020-11-18 ENCOUNTER — Encounter: Payer: Self-pay | Admitting: *Deleted

## 2020-11-18 ENCOUNTER — Telehealth: Payer: Self-pay | Admitting: *Deleted

## 2020-11-18 DIAGNOSIS — F3181 Bipolar II disorder: Secondary | ICD-10-CM | POA: Diagnosis not present

## 2020-11-18 DIAGNOSIS — F3132 Bipolar disorder, current episode depressed, moderate: Secondary | ICD-10-CM | POA: Diagnosis not present

## 2020-11-18 DIAGNOSIS — F411 Generalized anxiety disorder: Secondary | ICD-10-CM | POA: Diagnosis not present

## 2020-11-18 NOTE — Telephone Encounter (Signed)
Ajovy PA, key: [id number], [id number]. Meds tried: gabapentin, propranolol, indomethacin, ibuprofen, tylenol, toradol. Your information has been submitted to [place].  If [facility] has not responded to your request within 24 hours, contact [place] at [phone]

## 2020-11-18 NOTE — Telephone Encounter (Signed)
I submitted a PA request for Nurtec on CMM, Key: [id number].   Awaiting determination from [place].

## 2020-11-18 NOTE — Telephone Encounter (Addendum)
Received fax from [place] denied because requested more than allowed  quantity of 16. Rx is for 8 tabs a month. Nurtec approved for up to 16 tabs a month x 12 months. Sent my chart to inform patient.

## 2020-11-18 NOTE — Telephone Encounter (Signed)
Ajovy approved 11/18/2020 - 02/18/2021. Sent her my chart to inform.

## 2020-11-20 ENCOUNTER — Encounter: Payer: Self-pay | Admitting: *Deleted

## 2020-11-20 NOTE — Telephone Encounter (Signed)
Pt called, pharmacy informed would have to pay $1000. [place], was instructed to have physician fill out a patient assistance form. May be able to get medication at no cost. Would like a call from the nurse.

## 2020-11-20 NOTE — Telephone Encounter (Addendum)
Called pharmacy, spoke with Hoyle Sauer who stated insurance denied 8 tabs for 30 days, stated not covered. [facility] prescription Help desk # [phone] spoke with Jacqulyn Ducking who stated pharmacy ran Rx 8 tabs for 8 days. She was unable to get 8 tabs for 30 days to go through. She spoke with senior PA team, I was on hold > 15 minutes. She stated that medication approved 8 tabs for 30 days, $47 co pay. [facility] spoke with pharmacist who was able to run Rx through. Sent my chart to inform patient.

## 2020-11-21 DIAGNOSIS — M9903 Segmental and somatic dysfunction of lumbar region: Secondary | ICD-10-CM | POA: Diagnosis not present

## 2020-11-21 DIAGNOSIS — M9901 Segmental and somatic dysfunction of cervical region: Secondary | ICD-10-CM | POA: Diagnosis not present

## 2020-11-21 DIAGNOSIS — M9905 Segmental and somatic dysfunction of pelvic region: Secondary | ICD-10-CM | POA: Diagnosis not present

## 2020-11-21 DIAGNOSIS — G4489 Other headache syndrome: Secondary | ICD-10-CM | POA: Diagnosis not present

## 2020-11-21 DIAGNOSIS — M9907 Segmental and somatic dysfunction of upper extremity: Secondary | ICD-10-CM | POA: Diagnosis not present

## 2020-11-21 DIAGNOSIS — M9902 Segmental and somatic dysfunction of thoracic region: Secondary | ICD-10-CM | POA: Diagnosis not present

## 2020-11-21 DIAGNOSIS — M542 Cervicalgia: Secondary | ICD-10-CM | POA: Diagnosis not present

## 2020-11-21 DIAGNOSIS — G894 Chronic pain syndrome: Secondary | ICD-10-CM | POA: Diagnosis not present

## 2020-11-24 ENCOUNTER — Ambulatory Visit: Payer: Medicare Other | Admitting: Diagnostic Neuroimaging

## 2020-11-25 ENCOUNTER — Telehealth: Payer: Self-pay | Admitting: *Deleted

## 2020-11-25 NOTE — Telephone Encounter (Signed)
Called patient and advised her per Dr Leta Baptist that she is taking too much medication for headaches. She should limit Nurtec to only 8 tabs per 30 days, 9 per 30 days for rizatriptan. She stated she is trying to get rid of daily headaches. When she wakes and moves her head she gets a headache; this is daily. She has been taking nurtec every morning and rizatriptan as needed. She only has 1 Rizatriptan remaining, is too early to refill. She stated she has received Toradol injections and steroid injections in past from other providers. I advised Dr Leta Baptist stated she can come in for migraine infusion; she agreed. I advised will call her back if medication, appointment time is available today. Patient verbalized understanding, appreciation. Per Maudie Mercury RN, infusion suite there is still a Producer, television/film/video of [place]; none to infuse migraine patients in our office. Will advise MD>

## 2020-11-25 NOTE — Telephone Encounter (Signed)
Called patient, discussed at length how to avoid rebound headache, how to properly take Nurtec and rizatriptan, advised she stop taking Nurtec every morning, stay hydrated, avoid triggers. She reported seeing Eye dr recently and needs new glasses and sunglasses. I asked her if she has been evaluated for sleep apnea; she stated no. Looking into her chart I advised her she has appointment with pulmonology 9/22 to discuss CT scan, sleep study, was to also have had PFT Aug 2021.  She stated family has told her in past she snores, and she wakes up with drool on her mouth.  She will call pulmonology to see if she can be seen sooner, will stop daily Nurtec, try measures we discussed for headaches. Patient verbalized understanding, appreciation.

## 2020-11-27 ENCOUNTER — Other Ambulatory Visit: Payer: Self-pay | Admitting: Family Medicine

## 2020-11-27 DIAGNOSIS — B029 Zoster without complications: Secondary | ICD-10-CM | POA: Diagnosis not present

## 2020-11-28 ENCOUNTER — Ambulatory Visit: Payer: Medicare Other | Admitting: Registered Nurse

## 2020-12-02 ENCOUNTER — Encounter: Payer: Self-pay | Admitting: Family Medicine

## 2020-12-04 DIAGNOSIS — F3181 Bipolar II disorder: Secondary | ICD-10-CM | POA: Diagnosis not present

## 2020-12-04 DIAGNOSIS — F411 Generalized anxiety disorder: Secondary | ICD-10-CM | POA: Diagnosis not present

## 2020-12-04 DIAGNOSIS — F3132 Bipolar disorder, current episode depressed, moderate: Secondary | ICD-10-CM | POA: Diagnosis not present

## 2020-12-05 DIAGNOSIS — K146 Glossodynia: Secondary | ICD-10-CM | POA: Diagnosis not present

## 2020-12-05 DIAGNOSIS — G4733 Obstructive sleep apnea (adult) (pediatric): Secondary | ICD-10-CM | POA: Diagnosis not present

## 2020-12-05 DIAGNOSIS — K219 Gastro-esophageal reflux disease without esophagitis: Secondary | ICD-10-CM | POA: Diagnosis not present

## 2020-12-08 ENCOUNTER — Other Ambulatory Visit: Payer: Self-pay | Admitting: *Deleted

## 2020-12-08 ENCOUNTER — Telehealth: Payer: Self-pay | Admitting: Diagnostic Neuroimaging

## 2020-12-08 DIAGNOSIS — R200 Anesthesia of skin: Secondary | ICD-10-CM

## 2020-12-08 DIAGNOSIS — R799 Abnormal finding of blood chemistry, unspecified: Secondary | ICD-10-CM

## 2020-12-08 DIAGNOSIS — R252 Cramp and spasm: Secondary | ICD-10-CM

## 2020-12-08 NOTE — Telephone Encounter (Signed)
Called patient who stated she's having more numbness on left side of face, head, arm. New symptom is left calf feels like something tight is on it.  Feel like I have to drag it. At night I get pulsating nerve impulses in my legs, my feet cramp. She still takes magnesium L theronate for leg cramps as advised by her sports med Dr. She is asking if she can have labs done, feels like her symptoms are nerve related and /or migraine related. I asked if she has called PCP; she stated she has not, thinks this is nerve related. I advised will discuss with Dr Leta Baptist and let her know. Patient verbalized understanding, appreciation.

## 2020-12-08 NOTE — Telephone Encounter (Signed)
Orders Placed This Encounter  Procedures   MR BRAIN W WO CONTRAST   Vitamin B12   Iron, TIBC and Ferritin Panel   Hemoglobin A1c   CK   Aldolase   TSH   T4, Free   CBC with Differential/Platelet   Comprehensive metabolic panel   Will check MRI and labs.    Penni Bombard, MD [id number], [id number] AM Certified in Neurology, Neurophysiology and Neuroimaging  [facility] [address] [place], [place] [address] [phone]

## 2020-12-08 NOTE — Telephone Encounter (Signed)
Pt called, having migraines, would like a call from the nurse to discuss getting an infusion.

## 2020-12-08 NOTE — Telephone Encounter (Signed)
Called patient and advised her of orders Dr Leta Baptist placed. Advised she'll get a call when MRI is approved; she can get it in [place] if she prefers. She will go to [facility] in [place] for labs today. Patient verbalized understanding, appreciation. Lbs released.

## 2020-12-09 ENCOUNTER — Encounter: Payer: Self-pay | Admitting: *Deleted

## 2020-12-09 DIAGNOSIS — G43109 Migraine with aura, not intractable, without status migrainosus: Secondary | ICD-10-CM

## 2020-12-09 NOTE — Telephone Encounter (Signed)
Called [facility] to check on labs, spoke with Tashika.  She had no records, will send my chart to ask if patient has had labs drawn yet.

## 2020-12-10 ENCOUNTER — Telehealth: Payer: Self-pay | Admitting: Diagnostic Neuroimaging

## 2020-12-10 ENCOUNTER — Other Ambulatory Visit: Payer: Self-pay | Admitting: Diagnostic Neuroimaging

## 2020-12-10 DIAGNOSIS — R200 Anesthesia of skin: Secondary | ICD-10-CM | POA: Diagnosis not present

## 2020-12-10 DIAGNOSIS — R252 Cramp and spasm: Secondary | ICD-10-CM | POA: Diagnosis not present

## 2020-12-10 DIAGNOSIS — R799 Abnormal finding of blood chemistry, unspecified: Secondary | ICD-10-CM | POA: Diagnosis not present

## 2020-12-10 NOTE — Telephone Encounter (Addendum)
Per VO from Dr. Leta Baptist, ok to add Magnesium.  I called pt back and she sts she just had her blood test drawn.  I called [facility] about adding on the magnesium level. I could not reach a [facility] tech but left a vm for a CB.  I did fax the add on order to [facility] # [phone], confirmation received. Pt advised of this and is going to wait about 10 mins to see if the [facility] can add the order on or if it needs to be drawn again. Pt will cb if she has any issues.

## 2020-12-10 NOTE — Telephone Encounter (Signed)
I called the pt back. She sts she is at [facility] getting her blood drawn this morning.  She wanted to know if Dr. Leta Baptist would be willing to add on a magnesium level. Pt reports waking up all hours of the night with leg and feet cramps. She had spoke with a nurse friend and wanted to see if this could be added?

## 2020-12-10 NOTE — Telephone Encounter (Signed)
Pt called states she is at [facility] now to do her labs. She sent a Mychart yesterday evening regarding the Magnesium. Pt requesting a call back.

## 2020-12-11 LAB — IRON,TIBC AND FERRITIN PANEL
Ferritin: 44 ng/mL (ref 15–150)
Iron Saturation: 14 % — ABNORMAL LOW (ref 15–55)
Iron: 48 ug/dL (ref 27–139)
Total Iron Binding Capacity: 340 ug/dL (ref 250–450)
UIBC: 292 ug/dL (ref 118–369)

## 2020-12-11 LAB — CBC WITH DIFFERENTIAL/PLATELET
Basophils Absolute: 0 10*3/uL (ref 0.0–0.2)
Basos: 0 %
EOS (ABSOLUTE): 0.1 10*3/uL (ref 0.0–0.4)
Eos: 1 %
Hematocrit: 36.5 % (ref 34.0–46.6)
Hemoglobin: 12.2 g/dL (ref 11.1–15.9)
Immature Grans (Abs): 0 10*3/uL (ref 0.0–0.1)
Immature Granulocytes: 0 %
Lymphocytes Absolute: 1.1 10*3/uL (ref 0.7–3.1)
Lymphs: 10 %
MCH: 30.3 pg (ref 26.6–33.0)
MCHC: 33.4 g/dL (ref 31.5–35.7)
MCV: 91 fL (ref 79–97)
Monocytes Absolute: 0.6 10*3/uL (ref 0.1–0.9)
Monocytes: 5 %
Neutrophils Absolute: 8.9 10*3/uL — ABNORMAL HIGH (ref 1.4–7.0)
Neutrophils: 84 %
Platelets: 292 10*3/uL (ref 150–450)
RBC: 4.03 x10E6/uL (ref 3.77–5.28)
RDW: 12 % (ref 11.7–15.4)
WBC: 10.7 10*3/uL (ref 3.4–10.8)

## 2020-12-11 LAB — COMPREHENSIVE METABOLIC PANEL
ALT: 41 IU/L — ABNORMAL HIGH (ref 0–32)
AST: 48 IU/L — ABNORMAL HIGH (ref 0–40)
Albumin/Globulin Ratio: 2.1 (ref 1.2–2.2)
Albumin: 4.6 g/dL (ref 3.8–4.8)
Alkaline Phosphatase: 85 IU/L (ref 44–121)
BUN/Creatinine Ratio: 24 (ref 12–28)
BUN: 18 mg/dL (ref 8–27)
Bilirubin Total: <0.2 mg/dL (ref 0.0–1.2)
CO2: 27 mmol/L (ref 20–29)
Calcium: 9.6 mg/dL (ref 8.7–10.3)
Chloride: 99 mmol/L (ref 96–106)
Creatinine, Ser: 0.75 mg/dL (ref 0.57–1.00)
Globulin, Total: 2.2 g/dL (ref 1.5–4.5)
Glucose: 107 mg/dL — ABNORMAL HIGH (ref 65–99)
Potassium: 4.4 mmol/L (ref 3.5–5.2)
Sodium: 140 mmol/L (ref 134–144)
Total Protein: 6.8 g/dL (ref 6.0–8.5)
eGFR: 88 mL/min/{1.73_m2} (ref >59)

## 2020-12-11 LAB — T4, FREE: Free T4: 0.82 ng/dL (ref 0.82–1.77)

## 2020-12-11 LAB — MAGNESIUM: Magnesium: 1.9 mg/dL (ref 1.6–2.3)

## 2020-12-11 LAB — TSH: TSH: 2.83 u[IU]/mL (ref 0.450–4.500)

## 2020-12-11 LAB — HEMOGLOBIN A1C
Est. average glucose Bld gHb Est-mCnc: 108 mg/dL
Hgb A1c MFr Bld: 5.4 % (ref 4.8–5.6)

## 2020-12-11 LAB — CK: Total CK: 471 U/L — ABNORMAL HIGH (ref 32–182)

## 2020-12-11 LAB — ALDOLASE: Aldolase: 9.4 U/L (ref 3.3–10.3)

## 2020-12-14 ENCOUNTER — Ambulatory Visit
Admission: RE | Admit: 2020-12-14 | Discharge: 2020-12-14 | Disposition: A | Discharge status: Home / Self Care | Payer: Medicare Other | Source: Ambulatory Visit | Attending: Diagnostic Neuroimaging | Admitting: Diagnostic Neuroimaging

## 2020-12-14 DIAGNOSIS — R202 Paresthesia of skin: Secondary | ICD-10-CM | POA: Diagnosis not present

## 2020-12-14 DIAGNOSIS — R200 Anesthesia of skin: Secondary | ICD-10-CM

## 2020-12-14 MED ORDER — GADOBENATE DIMEGLUMINE 529 MG/ML IV SOLN
10.0000 mL | Freq: Once | INTRAVENOUS | Status: AC | PRN
  Administered 2020-12-14: 10 mL via INTRAVENOUS

## 2020-12-15 DIAGNOSIS — G4730 Sleep apnea, unspecified: Secondary | ICD-10-CM | POA: Diagnosis not present

## 2020-12-17 DIAGNOSIS — F411 Generalized anxiety disorder: Secondary | ICD-10-CM | POA: Diagnosis not present

## 2020-12-17 DIAGNOSIS — F3132 Bipolar disorder, current episode depressed, moderate: Secondary | ICD-10-CM | POA: Diagnosis not present

## 2020-12-18 ENCOUNTER — Encounter: Payer: Self-pay | Admitting: Adult Health

## 2020-12-18 ENCOUNTER — Ambulatory Visit (INDEPENDENT_AMBULATORY_CARE_PROVIDER_SITE_OTHER): Payer: Medicare Other | Admitting: Adult Health

## 2020-12-18 ENCOUNTER — Other Ambulatory Visit: Payer: Self-pay

## 2020-12-18 ENCOUNTER — Ambulatory Visit
Admission: RE | Admit: 2020-12-18 | Discharge: 2020-12-18 | Disposition: A | Discharge status: Home / Self Care | Payer: Medicare Other | Source: Ambulatory Visit | Attending: Adult Health | Admitting: Adult Health

## 2020-12-18 ENCOUNTER — Ambulatory Visit
Admission: RE | Admit: 2020-12-18 | Discharge: 2020-12-18 | Disposition: A | Discharge status: Home / Self Care | Payer: Medicare Other | Attending: Adult Health | Admitting: Adult Health

## 2020-12-18 VITALS — BP 144/78 | HR 78 | Temp 97.1°F | Ht 61.0 in | Wt 114.8 lb

## 2020-12-18 DIAGNOSIS — J45909 Unspecified asthma, uncomplicated: Secondary | ICD-10-CM | POA: Diagnosis not present

## 2020-12-18 DIAGNOSIS — J189 Pneumonia, unspecified organism: Secondary | ICD-10-CM | POA: Diagnosis not present

## 2020-12-18 DIAGNOSIS — J4520 Mild intermittent asthma, uncomplicated: Secondary | ICD-10-CM

## 2020-12-18 DIAGNOSIS — J309 Allergic rhinitis, unspecified: Secondary | ICD-10-CM | POA: Diagnosis not present

## 2020-12-18 MED ORDER — BUDESONIDE-FORMOTEROL FUMARATE 160-4.5 MCG/ACT IN AERO: 2.0000 | INHALATION_SPRAY | Freq: Two times a day (BID) | RESPIRATORY_TRACT | 5 refills | Status: DC

## 2020-12-18 NOTE — Assessment & Plan Note (Signed)
Currently under good control continue on trigger prevention.  Plan  Patient Instructions  Decrease Symbicort 160/4.26mcg 1 puffs Twice daily  , rinse after use.  Continue on Singulair 10mg  daily .  Zyrtec 10mg . At bedtime  As needed   Mucinex DM Twice daily  As needed  Cough /congestion  Albuterol inhaler As needed   Chest xray today .  Follow up with Dr. Mortimer Fries in 6 months and As needed  [facility] office.  Please contact office for sooner follow up if symptoms do not improve or worsen or seek emergency care

## 2020-12-18 NOTE — Progress Notes (Signed)
@Patient  ID: Theresa Martin, female    DOB: 29-Oct-1955, 65 y.o.   MRN: [id number]  Chief Complaint  Patient presents with   Follow-up    Referring provider: [name], MD  HPI: 65 year old female never smoker followed for Asthma Medical history significant for Bipolar disorder , Schizo affective Schizophrenia , Migraines   TEST/EVENTS :  CT chest 10/2018 showed chronic changes in RUL felt from previous PNA  And multiple 3 mm nodules.   CT chest November 2021 negative for PE, stable chronic scarring in the lung apices, chronic airspace opacity in the right upper lobe, opacity in the left lower lobe, stable to millimeter lung nodules, no adenopathy, small hiatal hernia  CBC - Eosinophils 11/2020 -ab eos (100)  12/18/2020 Follow up : Asthma  Patient returns for a follow up visit . Last visit was 01/2020 for pneumonia. She is prone to recurrent pneumonia .  She was treated with antibiotics with improved symptoms. CT chest showed chronic scarring in apices /right upper lobe. New opacity in LLL . Needs follow up chest xray .  She remains on Symbicort 2 puffs twice daily.  Says that her breathing has been doing well.  She had no flare of cough or wheezing.  Does feel that her breathing can change with the seasons she definitely breathes better when is not as hot and humid outside.  She is also on Singulair daily.  Denies any increased nasal drainage.  Pulmonary function testing August 2021 showed normal lung function with FEV1 at 91%, ratio 82, FVC 84%, DLCO was 104%.  She does have chronic migraine headaches.  She is follows with neurology.  She is also been set up for home sleep study and is completing that this week through neurology.   No Known Allergies  Immunization History  Administered Date(s) Administered   Fluad Quad(high Dose 65+) 03/04/2020   Influenza,inj,Quad PF,6+ Mos 01/19/2017, 01/26/2019   Moderna Sars-Covid-2 Vaccination 09/03/2019, 09/24/2019    Pneumococcal Conjugate-13 01/17/2020   Pneumococcal Polysaccharide-23 12/25/2018   Tdap 07/27/2012   Zoster Recombinat (Shingrix) 05/04/2018, 10/26/2018   Zoster, Live 09/26/2014    Past Medical History:  Diagnosis Date   Allergy    Anxiety    Asthma    Depression    Hyperlipidemia    Hypertension    Migraines    Osteopenia after menopause    Persistent headaches    Pneumonia 2020   Postherpetic neuralgia    Renal disorder    Shingles outbreak 02/2014   Skin cancer    moles on right groin and right buttock.    Tobacco History: Social History   Tobacco Use  Smoking Status Never  Smokeless Tobacco Never   Counseling given: Not Answered   Outpatient Medications Prior to Visit  Medication Sig Dispense Refill   albuterol (VENTOLIN HFA) 108 (90 Base) MCG/ACT inhaler TAKE 2 PUFFS BY MOUTH EVERY 6 HOURS AS NEEDED FOR WHEEZE OR SHORTNESS OF BREATH 18 g 2   atorvastatin (LIPITOR) 20 MG tablet TAKE 1 TABLET BY MOUTH EVERY DAY 90 tablet 1   CALCIUM PO Take 1,300 mg by mouth daily.     Cholecalciferol (VITAMIN D3 PO) Take 5,000 Units by mouth.     clonazePAM (KLONOPIN) 0.5 MG tablet Take 0.5 mg by mouth daily as needed.     ELDERBERRY PO Take by mouth.     escitalopram (LEXAPRO) 10 MG tablet Take 10 mg by mouth daily.     Fremanezumab-vfrm (AJOVY) 225 MG/1.5ML SOSY  Inject 225 mg into the skin every 30 (thirty) days. 4.5 mL 4   gabapentin (NEURONTIN) 400 MG capsule TAKE 1 CAPSULE (400 MG TOTAL) BY MOUTH 2 (TWO) TIMES DAILY. (Patient taking differently: Take 400 mg by mouth 3 (three) times daily.) 60 capsule 12   Glucosamine-Chondroitin (MOVE FREE PO) Take by mouth daily.     latanoprost (XALATAN) 0.005 % ophthalmic solution Place 1 drop into both eyes at bedtime. 2.5 mL 12   Misc Natural Products (JOINT HEALTH PO) Take by mouth.     montelukast (SINGULAIR) 10 MG tablet TAKE 1 TABLET BY MOUTH EVERYDAY AT BEDTIME 30 tablet 5   Multiple Vitamins-Minerals (WOMENS MULTIVITAMIN PO)  Take by mouth.     Probiotic Product (PROBIOTIC PO) Take by mouth.     QUEtiapine (SEROQUEL XR) 400 MG 24 hr tablet Take 1 tablet (400 mg total) by mouth at bedtime. (Patient taking differently: Take 450 mg by mouth at bedtime.) 30 tablet 1   temazepam (RESTORIL) 15 MG capsule Take 1 capsule (15 mg total) by mouth at bedtime. 30 capsule 0   triamterene-hydrochlorothiazide (MAXZIDE-25) 37.5-25 MG tablet TAKE 1 TABLET BY MOUTH EVERY DAY 30 tablet 2   UNABLE TO FIND Med Name: magnesium L theronate  650 mg at HS     SYMBICORT 160-4.5 MCG/ACT inhaler INHALE 2 PUFFS INTO THE LUNGS IN THE MORNING AND AT BEDTIME. 10.2 each 5   indomethacin (INDOCIN) 25 MG capsule Take one pill po once or twice a day prn headahce (Patient not taking: Reported on 12/18/2020) 30 capsule 0   ketorolac (TORADOL) 10 MG tablet PLEASE SEE ATTACHED FOR DETAILED DIRECTIONS (Patient not taking: Reported on 12/18/2020)     Rimegepant Sulfate (NURTEC) 75 MG TBDP Take 75 mg by mouth daily as needed. (Patient not taking: Reported on 12/18/2020) 8 tablet 6   rizatriptan (MAXALT-MLT) 10 MG disintegrating tablet Take 1 tablet (10 mg total) by mouth as needed for migraine. May repeat in 2 hours if needed (Patient not taking: Reported on 12/18/2020) 9 tablet 11   traMADol (ULTRAM) 50 MG tablet Take 50 mg by mouth every 8 (eight) hours as needed. (Patient not taking: Reported on 12/18/2020)     No facility-administered medications prior to visit.     Review of Systems:   Constitutional:   No  weight loss, night sweats,  Fevers, chills,  +fatigue, or  lassitude.  HEENT:   No headaches,  Difficulty swallowing,  Tooth/dental problems, or  Sore throat,                No sneezing, itching, ear ache, nasal congestion, post nasal drip,   CV:  No chest pain,  Orthopnea, PND, swelling in lower extremities, anasarca, dizziness, palpitations, syncope.   GI  No heartburn, indigestion, abdominal pain, nausea, vomiting, diarrhea, change in bowel habits,  loss of appetite, bloody stools.   Resp:   No excess mucus, no productive cough,  No non-productive cough,  No coughing up of blood.  No change in color of mucus.  No wheezing.  No chest wall deformity  Skin: no rash or lesions.  GU: no dysuria, change in color of urine, no urgency or frequency.  No flank pain, no hematuria   MS:  No joint pain or swelling.  No decreased range of motion.  No back pain.    Physical Exam  BP (!) 144/78 (BP Location: Left Arm, Patient Position: Sitting, Cuff Size: Small)   Pulse 78   Temp (!) 97.1 F (  36.2 C) (Temporal)   Ht 5\' 1"  (1.549 m)   Wt 114 lb 12.8 oz (52.1 kg)   SpO2 98%   BMI 21.69 kg/m   GEN: A/Ox3; pleasant , NAD, well nourished    HEENT:  [place]/AT,  EACs-clear, TMs-wnl, NOSE-clear, THROAT-clear, no lesions, no postnasal drip or exudate noted.   NECK:  Supple w/ fair ROM; no JVD; normal carotid impulses w/o bruits; no thyromegaly or nodules palpated; no lymphadenopathy.    RESP  Clear  P & A; w/o, wheezes/ rales/ or rhonchi. no accessory muscle use, no dullness to percussion  CARD:  RRR, no m/r/g, no peripheral edema, pulses intact, no cyanosis or clubbing.  GI:   Soft & nt; nml bowel sounds; no organomegaly or masses detected.   Musco: Warm bil, no deformities or joint swelling noted.   Neuro: alert, no focal deficits noted.    Skin: Warm, no lesions or rashes    Lab Results:  CBC    Component Value Date/Time   WBC 10.7 12/10/2020 0954   WBC 7.7 03/03/2020 1204   RBC 4.03 12/10/2020 0954   RBC 4.02 03/03/2020 1204   HGB 12.2 12/10/2020 0954   HCT 36.5 12/10/2020 0954   PLT 292 12/10/2020 0954   MCV 91 12/10/2020 0954   MCH 30.3 12/10/2020 0954   MCH 30.7 01/31/2019 0936   MCHC 33.4 12/10/2020 0954   MCHC 33.1 03/03/2020 1204   RDW 12.0 12/10/2020 0954   LYMPHSABS 1.1 12/10/2020 0954   MONOABS 0.5 03/03/2020 1204   EOSABS 0.1 12/10/2020 0954   BASOSABS 0.0 12/10/2020 0954    BMET    Component Value  Date/Time   NA 140 12/10/2020 0954   K 4.4 12/10/2020 0954   CL 99 12/10/2020 0954   CO2 27 12/10/2020 0954   GLUCOSE 107 (H) 12/10/2020 0954   GLUCOSE 81 03/03/2020 1204   BUN 18 12/10/2020 0954   CREATININE 0.75 12/10/2020 0954   CREATININE 0.90 06/08/2017 1534   CALCIUM 9.6 12/10/2020 0954   GFRNONAA >60 02/11/2019 0702   GFRAA >60 02/11/2019 0702    BNP No results found for: BNP  ProBNP No results found for: PROBNP  Imaging: MR BRAIN W WO CONTRAST  Result Date: 12/16/2020  [facility] [address], [place], [place] [address] [phone] NEUROIMAGING REPORT STUDY DATE: 12/14/2020 PATIENT NAME: DOLORA RIDGELY DOB: 03-18-1956 MRN: [id number] EXAM: MRI Brain with and without contrast ORDERING CLINICIAN: Andrey Spearman MD CLINICAL HISTORY: 65 year old woman with numbness COMPARISON FILMS: 09/21/2019 TECHNIQUE:MRI of the brain with and without contrast was obtained utilizing 5 mm axial slices with T1, T2, T2 flair, SWI and diffusion weighted views.  T1 sagittal, T2 coronal and postcontrast views in the axial and coronal plane were obtained. CONTRAST: 10 ml [employer] IMAGING SITE: [facility], [place]. FINDINGS: On sagittal images, the spinal cord is imaged caudally to C5 and is normal in caliber.   The contents of the posterior fossa are of normal size and position.   The pituitary gland and optic chiasm appear normal.    Brain volume appears normal.   The ventricles are normal in size and without distortion.  There are no abnormal extra-axial collections of fluid.  In the hemispheres, there are scattered T2/FLAIR hyperintense foci predominantly in the subcortical and deep white matter.  None of the foci appear to be acute.  They do not enhance.  Compared to the MRI dated 09/21/2019, there are no new lesions.  The cerebellum and brainstem appears normal.   The deep gray matter appears normal.  The cerebral hemispheres appear normal.    Diffusion weighted images are normal.  Susceptibility weighted images are normal.   The orbits appear normal.   The VIIth/VIIIth nerve complex appears normal.  The mastoid air cells appear normal.  Couple of the ethmoid air cells have minimal mucoperiosteal thickening.  The other paranasal sinuses appear normal.  Flow voids are identified within the major intracerebral arteries.  After the infusion of contrast material, a normal enhancement pattern is noted.   This MRI of the brain with and without contrast shows the following: 1.   Scattered T2/FLAIR hyperintense foci predominantly in the subcortical and deep white matter of both hemispheres.  This is a nonspecific finding but is most consistent with chronic microvascular ischemic change.  Demyelination or vasculitis would be less likely to have this pattern.  None of the foci appear to be acute.  They do not enhance.  Compared to the MRI dated 09/21/2019, there are no new lesions. 2.   Normal enhancement pattern.  No acute findings. INTERPRETING PHYSICIAN: Richard A. Felecia Shelling, MD, PhD, FAAN Certified in  Neuroimaging by [employer]    ketorolac (TORADOL) injection 30 mg     Date Action Dose Route User   11/06/2020 1559 Given 30 mg Intramuscular (Left Deltoid) Brandon Melnick, RN       PFT Results Latest Ref Rng & Units 11/13/2019  FVC-Predicted Pre % 84  FVC-Post L 2.37  FVC-Predicted Post % 83  Pre FEV1/FVC % % 82  Post FEV1/FCV % % 74  FEV1-Pre L 1.98  FEV1-Predicted Pre % 91  FEV1-Post L 1.75  DLCO uncorrected ml/min/mmHg 18.67  DLCO [facility]% % 104  DLVA Predicted % 127  TLC L 3.95  TLC % Predicted % 85  RV % Predicted % 81    No results found for: NITRICOXIDE      Assessment & Plan:   Asthma Asthma well-controlled on current regimen.  Mild persistent.  We will try to decrease Symbicort to 1 puff twice daily  Plan    Patient Instructions  Decrease Symbicort 160/4.53mcg 1 puffs Twice daily  , rinse after use.   Continue on Singulair 10mg  daily .  Zyrtec 10mg . At bedtime  As needed   Mucinex DM Twice daily  As needed  Cough /congestion  Albuterol inhaler As needed   Chest xray today .  Follow up with Dr. Mortimer Fries in 6 months and As needed  [facility] office.  Please contact office for sooner follow up if symptoms do not improve or worsen or seek emergency care        Allergic rhinitis Currently under good control continue on trigger prevention.  Plan  Patient Instructions  Decrease Symbicort 160/4.38mcg 1 puffs Twice daily  , rinse after use.  Continue on Singulair 10mg  daily .  Zyrtec 10mg . At bedtime  As needed   Mucinex DM Twice daily  As needed  Cough /congestion  Albuterol inhaler As needed   Chest xray today .  Follow up with Dr. Mortimer Fries in 6 months and As needed  [facility] office.  Please contact office for sooner follow up if symptoms do not improve or worsen or seek emergency care        Pneumonia History of recurrent pneumonia.  CT chest in November showed a left lower lobe pneumonia along with chronic changes from suspected previous pneumonias.  Clinically she has improved after antibiotic  therapy.  Currently no active signs of.  She is a never smoker.  Check chest x-ray today.     Rexene Edison, NP 12/18/2020

## 2020-12-18 NOTE — Assessment & Plan Note (Signed)
History of recurrent pneumonia.  CT chest in November showed a left lower lobe pneumonia along with chronic changes from suspected previous pneumonias.  Clinically she has improved after antibiotic therapy.  Currently no active signs of.  She is a never smoker.  Check chest x-ray today.

## 2020-12-18 NOTE — Patient Instructions (Signed)
Decrease Symbicort 160/4.8mcg 1 puffs Twice daily  , rinse after use.  Continue on Singulair 10mg  daily .  Zyrtec 10mg . At bedtime  As needed   Mucinex DM Twice daily  As needed  Cough /congestion  Albuterol inhaler As needed   Chest xray today .  Follow up with Dr. Mortimer Fries in 6 months and As needed  [facility] office.  Please contact office for sooner follow up if symptoms do not improve or worsen or seek emergency care

## 2020-12-18 NOTE — Assessment & Plan Note (Signed)
Asthma well-controlled on current regimen.  Mild persistent.  We will try to decrease Symbicort to 1 puff twice daily  Plan    Patient Instructions  Decrease Symbicort 160/4.83mcg 1 puffs Twice daily  , rinse after use.  Continue on Singulair 10mg  daily .  Zyrtec 10mg . At bedtime  As needed   Mucinex DM Twice daily  As needed  Cough /congestion  Albuterol inhaler As needed   Chest xray today .  Follow up with Dr. Mortimer Fries in 6 months and As needed  [facility] office.  Please contact office for sooner follow up if symptoms do not improve or worsen or seek emergency care

## 2020-12-22 DIAGNOSIS — M9907 Segmental and somatic dysfunction of upper extremity: Secondary | ICD-10-CM | POA: Diagnosis not present

## 2020-12-22 DIAGNOSIS — M542 Cervicalgia: Secondary | ICD-10-CM | POA: Diagnosis not present

## 2020-12-22 DIAGNOSIS — G43011 Migraine without aura, intractable, with status migrainosus: Secondary | ICD-10-CM | POA: Diagnosis not present

## 2020-12-22 DIAGNOSIS — M9902 Segmental and somatic dysfunction of thoracic region: Secondary | ICD-10-CM | POA: Diagnosis not present

## 2020-12-22 DIAGNOSIS — M9908 Segmental and somatic dysfunction of rib cage: Secondary | ICD-10-CM | POA: Diagnosis not present

## 2020-12-22 DIAGNOSIS — M9905 Segmental and somatic dysfunction of pelvic region: Secondary | ICD-10-CM | POA: Diagnosis not present

## 2020-12-22 DIAGNOSIS — M9901 Segmental and somatic dysfunction of cervical region: Secondary | ICD-10-CM | POA: Diagnosis not present

## 2020-12-22 NOTE — Telephone Encounter (Signed)
Please advise for blood work

## 2020-12-24 ENCOUNTER — Telehealth: Payer: Self-pay

## 2020-12-24 NOTE — Addendum Note (Signed)
Addended by: Andrey Spearman R on: 12/24/2020 11:00 AM   Modules accepted: Orders

## 2020-12-24 NOTE — Telephone Encounter (Signed)
I called and spoke with patient about her booster shot. Pt understood. However, she wants to know what can be done about her migraines. Pt states that she has seen someone for this and have been given medication but is still having them. Please advise

## 2020-12-24 NOTE — Addendum Note (Signed)
Addended by: Gertie Baron D on: 12/24/2020 03:23 PM   Modules accepted: Orders

## 2020-12-24 NOTE — Telephone Encounter (Signed)
Please advise, What do you recommend regarding  - Botox injections  -Infusion with the medicines that are available  -sleep study there at our office

## 2020-12-24 NOTE — Telephone Encounter (Signed)
Please advise ASAP 

## 2020-12-24 NOTE — Telephone Encounter (Signed)
Pt had booster shot in January and when she went to get her flu shot she was going to get the 4th shot. A message come through on MyChart to post pone until 12/22 and she is wanting Dr Birdie Riddle advise about getting 4th booster pt is still have migraines.   Pt call back [phone]

## 2020-12-25 ENCOUNTER — Encounter: Payer: Self-pay | Admitting: Family Medicine

## 2020-12-25 NOTE — Telephone Encounter (Signed)
Since she has seen neurology ([facility]) about her migraines and they have treated her, she needs to reach out to them and let them know things aren't working.  They need the opportunity to change things without me interfering

## 2020-12-25 NOTE — Telephone Encounter (Signed)
Called and spoke with patient about her concerns with migraines. Patient stated that she is getting an infusion with her neurologist today. I advised patient to let us know if there is anything else we can do. Pt understood. No further concerns at this time.

## 2020-12-27 DIAGNOSIS — Z20822 Contact with and (suspected) exposure to covid-19: Secondary | ICD-10-CM | POA: Diagnosis not present

## 2020-12-29 ENCOUNTER — Telehealth: Payer: Self-pay

## 2020-12-29 ENCOUNTER — Other Ambulatory Visit: Payer: Self-pay

## 2020-12-29 DIAGNOSIS — Z1211 Encounter for screening for malignant neoplasm of colon: Secondary | ICD-10-CM

## 2020-12-29 NOTE — Telephone Encounter (Signed)
Left message on vm said she wants to schedule a colonoscopy

## 2020-12-29 NOTE — Progress Notes (Signed)
Gastroenterology Pre-Procedure Review  Request Date: 01/13/2021 Requesting Physician: Dr. Vicente Males  PATIENT REVIEW QUESTIONS: The patient responded to the following health history questions as indicated:    1. Are you having any GI issues? no 2. Do you have a personal history of Polyps? no 3. Do you have a family history of Colon Cancer or Polyps? yes (BROTHER HAS POLYPS) 4. Diabetes Mellitus? no 5. Joint replacements in the past 12 months?no 6. Major health problems in the past 3 months?yes (MIGRANES) 7. Any artificial heart valves, MVP, or defibrillator?no    MEDICATIONS & ALLERGIES:    Patient reports the following regarding taking any anticoagulation/antiplatelet therapy:   Plavix, Coumadin, Eliquis, Xarelto, Lovenox, Pradaxa, Brilinta, or Effient? no Aspirin? no  Patient confirms/reports the following medications:  Current Outpatient Medications  Medication Sig Dispense Refill   albuterol (VENTOLIN HFA) 108 (90 Base) MCG/ACT inhaler TAKE 2 PUFFS BY MOUTH EVERY 6 HOURS AS NEEDED FOR WHEEZE OR SHORTNESS OF BREATH 18 g 2   atorvastatin (LIPITOR) 20 MG tablet TAKE 1 TABLET BY MOUTH EVERY DAY 90 tablet 1   budesonide-formoterol (SYMBICORT) 160-4.5 MCG/ACT inhaler Inhale 2 puffs into the lungs 2 (two) times daily. in the morning and at bedtime. 10.2 each 5   CALCIUM PO Take 1,300 mg by mouth daily.     Cholecalciferol (VITAMIN D3 PO) Take 5,000 Units by mouth.     clonazePAM (KLONOPIN) 0.5 MG tablet Take 0.5 mg by mouth daily as needed.     ELDERBERRY PO Take by mouth.     escitalopram (LEXAPRO) 10 MG tablet Take 10 mg by mouth daily.     Fremanezumab-vfrm (AJOVY) 225 MG/1.5ML SOSY Inject 225 mg into the skin every 30 (thirty) days. 4.5 mL 4   gabapentin (NEURONTIN) 400 MG capsule TAKE 1 CAPSULE (400 MG TOTAL) BY MOUTH 2 (TWO) TIMES DAILY. (Patient taking differently: Take 400 mg by mouth 3 (three) times daily.) 60 capsule 12   Glucosamine-Chondroitin (MOVE FREE PO) Take by mouth daily.      indomethacin (INDOCIN) 25 MG capsule Take one pill po once or twice a day prn headahce (Patient not taking: Reported on 12/18/2020) 30 capsule 0   ketorolac (TORADOL) 10 MG tablet PLEASE SEE ATTACHED FOR DETAILED DIRECTIONS (Patient not taking: Reported on 12/18/2020)     latanoprost (XALATAN) 0.005 % ophthalmic solution Place 1 drop into both eyes at bedtime. 2.5 mL 12   Misc Natural Products (JOINT HEALTH PO) Take by mouth.     montelukast (SINGULAIR) 10 MG tablet TAKE 1 TABLET BY MOUTH EVERYDAY AT BEDTIME 30 tablet 5   Multiple Vitamins-Minerals (WOMENS MULTIVITAMIN PO) Take by mouth.     Probiotic Product (PROBIOTIC PO) Take by mouth.     QUEtiapine (SEROQUEL XR) 400 MG 24 hr tablet Take 1 tablet (400 mg total) by mouth at bedtime. (Patient taking differently: Take 450 mg by mouth at bedtime.) 30 tablet 1   Rimegepant Sulfate (NURTEC) 75 MG TBDP Take 75 mg by mouth daily as needed. (Patient not taking: Reported on 12/18/2020) 8 tablet 6   rizatriptan (MAXALT-MLT) 10 MG disintegrating tablet Take 1 tablet (10 mg total) by mouth as needed for migraine. May repeat in 2 hours if needed (Patient not taking: Reported on 12/18/2020) 9 tablet 11   temazepam (RESTORIL) 15 MG capsule Take 1 capsule (15 mg total) by mouth at bedtime. 30 capsule 0   traMADol (ULTRAM) 50 MG tablet Take 50 mg by mouth every 8 (eight) hours as needed. (Patient not  taking: Reported on 12/18/2020)     triamterene-hydrochlorothiazide (MAXZIDE-25) 37.5-25 MG tablet TAKE 1 TABLET BY MOUTH EVERY DAY 30 tablet 2   UNABLE TO FIND Med Name: magnesium L theronate  650 mg at HS     No current facility-administered medications for this visit.    Patient confirms/reports the following allergies:  No Known Allergies  No orders of the defined types were placed in this encounter.   AUTHORIZATION INFORMATION Primary Insurance: 1D#: Group #:  Secondary Insurance: 1D#: Group #:  SCHEDULE INFORMATION: Date:   01/13/2021 Time: Location: [place]

## 2020-12-29 NOTE — Progress Notes (Signed)
PATIENT ALREADY HAS HER BOWEL PREP MEDICINE

## 2020-12-31 DIAGNOSIS — F3132 Bipolar disorder, current episode depressed, moderate: Secondary | ICD-10-CM | POA: Diagnosis not present

## 2020-12-31 DIAGNOSIS — F411 Generalized anxiety disorder: Secondary | ICD-10-CM | POA: Diagnosis not present

## 2021-01-04 ENCOUNTER — Other Ambulatory Visit: Payer: Self-pay | Admitting: Family Medicine

## 2021-01-05 DIAGNOSIS — G4733 Obstructive sleep apnea (adult) (pediatric): Secondary | ICD-10-CM | POA: Diagnosis not present

## 2021-01-05 DIAGNOSIS — K146 Glossodynia: Secondary | ICD-10-CM | POA: Diagnosis not present

## 2021-01-05 DIAGNOSIS — K219 Gastro-esophageal reflux disease without esophagitis: Secondary | ICD-10-CM | POA: Diagnosis not present

## 2021-01-06 DIAGNOSIS — M9901 Segmental and somatic dysfunction of cervical region: Secondary | ICD-10-CM | POA: Diagnosis not present

## 2021-01-06 DIAGNOSIS — M546 Pain in thoracic spine: Secondary | ICD-10-CM | POA: Diagnosis not present

## 2021-01-06 DIAGNOSIS — M9905 Segmental and somatic dysfunction of pelvic region: Secondary | ICD-10-CM | POA: Diagnosis not present

## 2021-01-06 DIAGNOSIS — M9907 Segmental and somatic dysfunction of upper extremity: Secondary | ICD-10-CM | POA: Diagnosis not present

## 2021-01-06 DIAGNOSIS — M9904 Segmental and somatic dysfunction of sacral region: Secondary | ICD-10-CM | POA: Diagnosis not present

## 2021-01-06 DIAGNOSIS — M542 Cervicalgia: Secondary | ICD-10-CM | POA: Diagnosis not present

## 2021-01-06 DIAGNOSIS — M9908 Segmental and somatic dysfunction of rib cage: Secondary | ICD-10-CM | POA: Diagnosis not present

## 2021-01-06 DIAGNOSIS — M545 Low back pain, unspecified: Secondary | ICD-10-CM | POA: Diagnosis not present

## 2021-01-06 DIAGNOSIS — M65331 Trigger finger, right middle finger: Secondary | ICD-10-CM | POA: Diagnosis not present

## 2021-01-06 DIAGNOSIS — M9903 Segmental and somatic dysfunction of lumbar region: Secondary | ICD-10-CM | POA: Diagnosis not present

## 2021-01-06 DIAGNOSIS — M9902 Segmental and somatic dysfunction of thoracic region: Secondary | ICD-10-CM | POA: Diagnosis not present

## 2021-01-06 DIAGNOSIS — M9900 Segmental and somatic dysfunction of head region: Secondary | ICD-10-CM | POA: Diagnosis not present

## 2021-01-08 ENCOUNTER — Telehealth: Payer: Self-pay

## 2021-01-08 DIAGNOSIS — J4520 Mild intermittent asthma, uncomplicated: Secondary | ICD-10-CM

## 2021-01-08 NOTE — Telephone Encounter (Signed)
Spoke to patient and relayed below message. She is agreeable with oxygen.  Order has been placed. Nothing further needed.

## 2021-01-08 NOTE — Telephone Encounter (Signed)
-----   Message from Flora Lipps, MD sent at 01/07/2021  8:55 AM EDT ----- Regarding: RE: hypoxia at night ENT will send report but no Dx of OSA, patient has diagnosis of ASTHMA. She does NOT have diagnosis of OSA ----- Message ----- From: Linwood Dibbles, CMA Sent: 01/06/2021   2:28 PM EDT To: Flora Lipps, MD Subject: RE: hypoxia at night                           We can try to order based off of sleep study results but insurance is probably going to deny it. Does she wear a cpap? Sleep study + OSA? ----- Message ----- From: Flora Lipps, MD Sent: 01/06/2021   2:12 PM EDT To: Linwood Dibbles, CMA Subject: RE: hypoxia at night                           They did Not, they wanted me to address ----- Message ----- From: Linwood Dibbles, CMA Sent: 01/06/2021   8:01 AM EDT To: Flora Lipps, MD Subject: RE: hypoxia at night                           Did ENT order nocturnal oxygen? ----- Message ----- From: Flora Lipps, MD Sent: 01/05/2021   5:29 PM EDT To: Linwood Dibbles, CMA Subject: hypoxia at night                               Hello Yekaterina Escutia, ENT doc has called me stating that her HST was +for hypoxia 41 mins at 87% She will need oxygen at night, 1L [place]. She was notified by ENT doc.  Thank you

## 2021-01-09 NOTE — Telephone Encounter (Signed)
Dr Leta Baptist will be back in the office next Monday and can then address these questions. We usually do not repeat home sleep tests here unless the result was not valid ( you didn't sleep enough, it was an unusual night, or the device failed). CD

## 2021-01-12 ENCOUNTER — Telehealth: Payer: Self-pay

## 2021-01-12 DIAGNOSIS — M10062 Idiopathic gout, left knee: Secondary | ICD-10-CM | POA: Diagnosis not present

## 2021-01-12 DIAGNOSIS — R200 Anesthesia of skin: Secondary | ICD-10-CM | POA: Diagnosis not present

## 2021-01-12 DIAGNOSIS — M10061 Idiopathic gout, right knee: Secondary | ICD-10-CM | POA: Diagnosis not present

## 2021-01-12 DIAGNOSIS — M25462 Effusion, left knee: Secondary | ICD-10-CM | POA: Diagnosis not present

## 2021-01-12 DIAGNOSIS — M25461 Effusion, right knee: Secondary | ICD-10-CM | POA: Diagnosis not present

## 2021-01-12 DIAGNOSIS — R252 Cramp and spasm: Secondary | ICD-10-CM | POA: Diagnosis not present

## 2021-01-12 NOTE — Telephone Encounter (Signed)
Pt. Calling to reschedule colonoscopy

## 2021-01-12 NOTE — Telephone Encounter (Signed)
Called patient she wanted to cancel her procedure and will call us back when she is ready to reschedule

## 2021-01-13 ENCOUNTER — Telehealth: Payer: Self-pay | Admitting: Internal Medicine

## 2021-01-13 ENCOUNTER — Ambulatory Visit: Admission: RE | Admit: 2021-01-13 | Payer: Medicare Other | Source: Home / Self Care | Admitting: Gastroenterology

## 2021-01-13 ENCOUNTER — Encounter: Admission: RE | Payer: Self-pay | Source: Home / Self Care

## 2021-01-13 ENCOUNTER — Telehealth: Payer: Self-pay | Admitting: *Deleted

## 2021-01-13 DIAGNOSIS — M545 Low back pain, unspecified: Secondary | ICD-10-CM | POA: Diagnosis not present

## 2021-01-13 DIAGNOSIS — M542 Cervicalgia: Secondary | ICD-10-CM | POA: Diagnosis not present

## 2021-01-13 DIAGNOSIS — M9906 Segmental and somatic dysfunction of lower extremity: Secondary | ICD-10-CM | POA: Diagnosis not present

## 2021-01-13 DIAGNOSIS — M546 Pain in thoracic spine: Secondary | ICD-10-CM | POA: Diagnosis not present

## 2021-01-13 LAB — VITAMIN B12: Vitamin B-12: 1257 pg/mL — ABNORMAL HIGH (ref 232–1245)

## 2021-01-13 SURGERY — COLONOSCOPY WITH PROPOFOL
Anesthesia: General

## 2021-01-13 MED ORDER — AEROCHAMBER PLUS MISC: 2 refills | Status: DC

## 2021-01-13 NOTE — Telephone Encounter (Signed)
Spoke with the pt  She is requesting order for POC for night time use  She only uses o2 with sleep at 1 lpm  I advised that most of the time, ins will not cover POC for nocturnal use  Advised to contact DME regarding travel o2 for night time use if she wishes to have this and they can discuss options She verbalized understanding and denied further questions about this   She is asking if they have any spacers in the [place] office that she can have bc hers is falling apart  Per Marissa, they do not have any  Rx for this sent to her pharm of choice per her request

## 2021-01-13 NOTE — Telephone Encounter (Signed)
Received fax from [facility] re: sleep study results. Placed on MD desk for review.

## 2021-01-14 DIAGNOSIS — F411 Generalized anxiety disorder: Secondary | ICD-10-CM | POA: Diagnosis not present

## 2021-01-14 DIAGNOSIS — F3132 Bipolar disorder, current episode depressed, moderate: Secondary | ICD-10-CM | POA: Diagnosis not present

## 2021-01-14 DIAGNOSIS — F3181 Bipolar II disorder: Secondary | ICD-10-CM | POA: Diagnosis not present

## 2021-01-15 ENCOUNTER — Telehealth: Payer: Medicare Other

## 2021-01-15 ENCOUNTER — Ambulatory Visit: Payer: Medicare Other | Admitting: Advanced Practice Midwife

## 2021-01-20 DIAGNOSIS — F411 Generalized anxiety disorder: Secondary | ICD-10-CM | POA: Diagnosis not present

## 2021-01-20 DIAGNOSIS — F3132 Bipolar disorder, current episode depressed, moderate: Secondary | ICD-10-CM | POA: Diagnosis not present

## 2021-01-21 ENCOUNTER — Telehealth: Payer: Self-pay

## 2021-01-21 ENCOUNTER — Ambulatory Visit: Payer: Medicare Other | Admitting: Advanced Practice Midwife

## 2021-01-21 ENCOUNTER — Ambulatory Visit (INDEPENDENT_AMBULATORY_CARE_PROVIDER_SITE_OTHER): Payer: Medicare Other

## 2021-01-21 DIAGNOSIS — J4520 Mild intermittent asthma, uncomplicated: Secondary | ICD-10-CM

## 2021-01-21 DIAGNOSIS — E785 Hyperlipidemia, unspecified: Secondary | ICD-10-CM

## 2021-01-21 NOTE — Telephone Encounter (Signed)
Caller name:Kamarie Torrens   On DPR? :yes  Call back [place]  Provider they see: Birdie Riddle   Reason for call:Pt has noticed swelling in abdomen for about a week when she had a colonoscopy 10 years ago but they did not find anything and now she is due for another and had to cancel due to headaches. Pt is wanting to know what Dr Birdie Riddle recommends for her to do or will test need to be ordered?

## 2021-01-21 NOTE — Progress Notes (Signed)
Chronic Care Management Pharmacy Note  01/21/2021 Name:  Theresa Martin MRN:  [id number] DOB:  1955-11-02  Subjective: Theresa Martin Space is an 65 y.o. year old female who is a primary patient of Tabori, Aundra Millet, MD.  The CCM team was consulted for assistance with disease management and care coordination needs.    Engaged with patient by telephone for follow up visit in response to provider referral for pharmacy case management and/or care coordination services.   Consent to Services:  The patient was given information about Chronic Care Management services, agreed to services, and gave verbal consent prior to initiation of services.  Please see initial visit note for detailed documentation.   Patient Care Team: [name], MD as PCP - General (Family Medicine) Dalia Heading, CNM (Inactive) as Midwife (Certified Nurse Midwife) Robert Bellow, MD (General Surgery) Ulis Rias, MD (Psychiatry) Penni Bombard, MD as Consulting Physician (Neurology) Sable Feil, MD as Consulting Physician (Gastroenterology) Rushie Chestnut (Psychiatry) [facility]) Dermatology, [facility] (Dermatology) Gerda Diss, DO as Consulting Physician (Family Medicine) Debbora Presto, NP as Nurse Practitioner (Neurology) Flora Lipps, MD as Consulting Physician (Pulmonary Disease) Madelin Rear, [facility] as Pharmacist (Pharmacist)  Hospital visits: None in previous 6 months  Objective: Lab Results  Component Value Date   CREATININE 0.75 12/10/2020   CREATININE 0.72 03/03/2020   CREATININE 0.70 02/20/2020    Lab Results  Component Value Date   HGBA1C 5.4 12/10/2020   Last diabetic Eye exam: No results found for: HMDIABEYEEXA  Last diabetic Foot exam: No results found for: HMDIABFOOTEX      Component Value Date/Time   CHOL 158 03/03/2020 1204   TRIG 52.0 03/03/2020 1204   HDL 51.90 03/03/2020 1204   CHOLHDL 3 03/03/2020 1204    VLDL 10.4 03/03/2020 1204   [place] 96 03/03/2020 1204   [place] 106 (H) 06/08/2017 1534    Hepatic Function Latest Ref Rng & Units 12/10/2020 03/03/2020 09/05/2019  Total Protein 6.0 - 8.5 g/dL 6.8 6.7 7.4  Albumin 3.8 - 4.8 g/dL 4.6 4.1 4.6  AST 0 - 40 IU/L 48(H) 25 25  ALT 0 - 32 IU/L 41(H) 33 21  Alk Phosphatase 44 - 121 IU/L 85 80 93  Total Bilirubin 0.0 - 1.2 mg/dL <0.2 0.3 0.4  Bilirubin, Direct 0.0 - 0.3 mg/dL - 0.1 0.1   Lab Results  Component Value Date/Time   TSH 2.830 12/10/2020 09:54 AM   TSH 1.19 03/03/2020 12:04 PM   FREET4 0.82 12/10/2020 09:54 AM    CBC Latest Ref Rng & Units 12/10/2020 03/03/2020 09/05/2019  WBC 3.4 - 10.8 x10E3/uL 10.7 7.7 4.9  Hemoglobin 11.1 - 15.9 g/dL 12.2 12.0 12.9  Hematocrit 34.0 - 46.6 % 36.5 36.4 37.9  Platelets 150 - 450 x10E3/uL 292 262.0 339.0   No results found for: VD25OH   03/2020 DEXA AP Spine L1-L2 04/25/2020 65.3 Normal -0.5 1.100 g/cm2 AP Spine L1-L2 06/20/2017 62.4 Normal -0.2 1.145 g/cm2 AP Spine L1-L2 06/19/2015 60.4 Normal 0.4 1.208 g/cm2   DualFemur Neck Left 04/25/2020 65.3 Osteopenia -2.2 0.739 g/cm2 DualFemur Neck Left 06/20/2017 62.4 Osteopenia -1.8 0.783 g/cm2 DualFemur Neck Left 06/19/2015 60.4 Osteopenia -1.8 0.787 g/cm2   DualFemur Total Mean 04/25/2020 65.3 Osteopenia -1.4 0.834 g/cm2 DualFemur Total Mean 06/20/2017 62.4 Osteopenia -1.1 0.873 g/cm2 DualFemur Total Mean 06/19/2015 60.4 Osteopenia -1.1 0.875 g/cm2   The probability of a major osteoporotic fracture is 29.3% within the next ten years. Hip  fracture is 3.8%.  09/2018 US Carotid Bilateral: Minor carotid atherosclerosis. No hemodynamically significant ICA stenosis. Degree of narrowing less than 50% bilaterally by ultrasound criteria.  11/2017 Holter Monitor: NSR; Average HR 53; Less than 1% PVC;s; Less than 1% PAC;s; No significant arrhythmia  Clinical ASCVD: No MI/Stroke diagnosis noted in medical Hx, pt denies this.  The 10-year ASCVD risk score  (Arnett DK, et al., 2019) is: 9.5%   Values used to calculate the score:     Age: 14 years     Sex: Female     Is Non-Hispanic African American: No     Diabetic: No     Tobacco smoker: No     Systolic Blood Pressure: 024 mmHg     Is BP treated: Yes     HDL Cholesterol: 51.9 mg/dL     Total Cholesterol: 158 mg/dL    Social History   Tobacco Use  Smoking Status Never  Smokeless Tobacco Never   BP Readings from Last 3 Encounters:  12/18/20 (!) 144/78  11/17/20 134/86  11/06/20 (!) 150/73   Pulse Readings from Last 3 Encounters:  12/18/20 78  11/17/20 78  11/06/20 68   Wt Readings from Last 3 Encounters:  12/18/20 114 lb 12.8 oz (52.1 kg)  11/17/20 112 lb 8 oz (51 kg)  11/06/20 111 lb 3.2 oz (50.4 kg)    Assessment: Review of patient past medical history, allergies, medications, health status, including review of consultants reports, laboratory and other test data, was performed as part of comprehensive evaluation and provision of chronic care management services.   SDOH:  (Social Determinants of Health) assessments and interventions performed:    CCM Care Plan  No Known Allergies  Medications Reviewed Today     Reviewed by Eliseo Squires, CMA (Certified Medical Assistant) on 12/29/20 at [place] List Status: <None>   Medication Order Taking? Sig Documenting Provider Last Dose Status Informant  albuterol (VENTOLIN HFA) 108 (90 Base) MCG/ACT inhaler [id number] Yes TAKE 2 PUFFS BY MOUTH EVERY 6 HOURS AS NEEDED FOR WHEEZE OR SHORTNESS OF BREATH [name], MD  Active Self  atorvastatin (LIPITOR) 20 MG tablet [id number] Yes TAKE 1 TABLET BY MOUTH EVERY DAY [name], MD  Active   budesonide-formoterol [facility]) 160-4.5 MCG/ACT inhaler [id number] Yes Inhale 2 puffs into the lungs 2 (two) times daily. in the morning and at bedtime. Melvenia Needles, NP  Active   CALCIUM PO [id number] Yes Take 1,300 mg by mouth daily. [provider]  Active  Self  Cholecalciferol (VITAMIN D3 PO) [id number] Yes Take 5,000 Units by mouth. [provider]  Active Self  clonazePAM (KLONOPIN) 0.5 MG tablet [id number] Yes Take 0.5 mg by mouth daily as needed. [provider]  Active Self  ELDERBERRY PO [id number] Yes Take by mouth. [provider]  Active Self  escitalopram (LEXAPRO) 10 MG tablet [id number] Yes Take 10 mg by mouth daily. [provider]  Active Self  Fremanezumab-vfrm (AJOVY) 225 MG/1.5ML SOSY [id number] Yes Inject 225 mg into the skin every 30 (thirty) days. Penni Bombard, MD  Active            Med Note Linus Orn Nov 18, 2020 12:11 PM) Approved 11/18/2020 - 02/18/2021  gabapentin (NEURONTIN) 400 MG capsule [id number] Yes TAKE 1 CAPSULE (400 MG TOTAL) BY MOUTH 2 (TWO) TIMES DAILY.  Patient taking differently: Take 400 mg by mouth 3 (three) times daily.   Penumalli, Vikram R,  MD  Active   Glucosamine-Chondroitin (MOVE FREE PO) [id number] Yes Take by mouth daily. [provider]  Active Self  indomethacin (INDOCIN) 25 MG capsule [id number] Yes Take one pill po once or twice a day prn headahce Britt Bottom, MD  Active    Discontinued 12/29/20 0933 (Patient Preference)   latanoprost (XALATAN) 0.005 % ophthalmic solution [id number] Yes Place 1 drop into both eyes at bedtime. [name], MD  Active Self  Misc Natural Products (JOINT HEALTH PO) [id number] Yes Take by mouth. [provider]  Active Self  montelukast (SINGULAIR) 10 MG tablet [id number] Yes TAKE 1 TABLET BY MOUTH EVERYDAY AT BEDTIME [name], MD  Active   Multiple Vitamins-Minerals (WOMENS MULTIVITAMIN PO) [id number] Yes Take by mouth. [provider]  Active Self  Probiotic Product (PROBIOTIC PO) [id number] Yes Take by mouth. [provider]  Active Self  QUEtiapine (SEROQUEL XR) 400 MG 24 hr tablet [id number] Yes Take 1 tablet (400 mg total) by mouth at bedtime.  Patient taking  differently: Take 450 mg by mouth at bedtime.   Money, Lowry Ram, FNP  Active   Rimegepant Sulfate (NURTEC) 75 MG TBDP [id number] Yes Take 75 mg by mouth daily as needed. Penni Bombard, MD  Active            Med Note ([place] Nov 18, 2020 12:11 PM) 11/18/20 approved a 12 months   rizatriptan (MAXALT-MLT) 10 MG disintegrating tablet [id number] Yes Take 1 tablet (10 mg total) by mouth as needed for migraine. May repeat in 2 hours if needed Penumalli, Earlean Polka, MD  Active   temazepam (RESTORIL) 15 MG capsule [id number] Yes Take 1 capsule (15 mg total) by mouth at bedtime. Money, Lowry Ram, FNP  Active Self  traMADol (ULTRAM) 50 MG tablet [id number] Yes Take 50 mg by mouth every 8 (eight) hours as needed. [provider]  Active   triamterene-hydrochlorothiazide (MAXZIDE-25) 37.5-25 MG tablet [id number] Yes TAKE 1 TABLET BY MOUTH EVERY DAY [name], MD  Active   UNABLE TO FIND [id number] Yes Med Name: magnesium L theronate  650 [facility] at [facility] [provider]  Active Self  Med List Note Victoriano Lain, CPhT 01/31/19 2134): Brother helped with meds           Patient Active Problem List   Diagnosis Date Noted   GERD (gastroesophageal reflux disease) 02/28/2019   Schizo affective schizophrenia ([place]) 02/01/2019   Abnormal chest CT 12/11/2018   Pneumonia 09/29/2018   Nonallopathic lesion of thoracic region 09/05/2018   Nonallopathic lesion of rib cage 09/05/2018   Nonallopathic lesion of cervical region 09/05/2018   Nonallopathic lesion of lumbosacral region 09/05/2018   Nonallopathic lesion of sacral region 09/05/2018   Skin cancer    Chronic right shoulder pain 03/30/2017   Right shoulder pain 01/12/2017   Tendinopathy of rotator cuff 12/20/2016   Angle-closure glaucoma, severe stage 08/19/2015   Osteopenia 06/19/2015   Migraine without aura and without status migrainosus, not intractable 01/13/2015   High risk medications (not anticoagulants)  long-term use 09/26/2014   Routine general medical examination at a health care facility 07/27/2012   Hyperlipidemia 09/29/2010   URETEROPELVIC JUNCTION OBSTRUCTION, CONGENITAL 12/15/2009   CARDIAC MURMUR 11/26/2008   MAGNETIC RESONANCE IMAGING, BRAIN, ABNORMAL 10/29/2008   VENEREAL WART 10/23/2008   Major depressive disorder, recurrent episode ([place]) 10/23/2008   Essential hypertension 10/23/2008   Allergic rhinitis 10/23/2008   Asthma 10/23/2008   RENAL  CALCULUS, HX OF 10/23/2008    Immunization History  Administered Date(s) Administered   Fluad Quad(high Dose 65+) 03/04/2020   Influenza,inj,Quad PF,6+ Mos 01/19/2017, 01/26/2019   Moderna Sars-Covid-2 Vaccination 09/03/2019, 09/24/2019   Pneumococcal Conjugate-13 01/17/2020   Pneumococcal Polysaccharide-23 12/25/2018   Tdap 07/27/2012   Zoster Recombinat (Shingrix) 05/04/2018, 10/26/2018   Zoster, Live 09/26/2014   Conditions to be addressed/monitored: HTN HLD Asthma GERD MDD  Osteopenia  Current Barriers:  Working towards optimizing inhaler technique with symbicort as well as patient assistance   Pharmacist Clinical Goal(s):  Patient will verbalize ability to afford treatment regimen contact provider office for questions/concerns as evidenced notation of same in electronic health record through collaboration with PharmD and provider.   Interventions: 1:1 collaboration with [name], MD regarding development and update of comprehensive plan of care as evidenced by provider attestation and co-signature Inter-disciplinary care team collaboration (see longitudinal plan of care) Comprehensive medication review performed; medication list updated in electronic medical record  Hyperlipidemia: (LDL goal < 100) -Controlled -Current treatment: Atorvastatin 20 mg once daily  -Medications previously tried: n/a  -Current dietary patterns: fruit, sometimes a meal replacement, salad. Decaffeinated tea, Gatorade, coconut  water.  -Current exercise habits: pickle ball, stationary bike.  -Side effect review - no problems noted  -Reviewed side effects - no problems noted, no adherence concerns.  -Recommended to continue current medication  Asthma (Goal: control symptoms and prevent exacerbations) April 2022: Not typically needing to use rescue inhaler, changes in weather such is increased humidity can be a trigger and lead to use. Has been consistent with Symbicort twice daily, previously had some tongue/throat issues and had difficulty inhaling complete dose. Now that she is using chamber, this has gotten much better. Currently swishing/gargling with water after symbicort use but has not been spitting out.  12/2020: Has cut down to symbicort 160-4.5 mcg/act 1 buff twice daily prn. Feels breathing has done well. Consistent with rinsing and spitting w/ water after use. Will try again for patient assistance.   -Controlled -Current treatment  Albuterol (Ventolin) 2 puffs every 4-6 hours prn Symbicort 160-4.5 mcg/act 1 puff into the lungs int he morning and at bedtime - uses with chamber now  Montelukast 10 mg -Exacerbations requiring treatment in last 6 months: 0 -Patient reports consistent use of maintenance inhaler. Frequency of rescue inhaler use: rare -Counseled on making sure to rinse mouth after using symbicort -Recommended to continue current medication CPA to coordinate resending of symbicort pap   Osteopenia (Goal: ensure screening supplementation, safe medication use as needed) Typically taking 600 mg of calcium supplement/day, at times holding off due to constipation effects. Is trying to become more active again, has enjoyed pickle ball a lot previously. Most likely would not want to start any Rx medication therapy unless officially osteoporotic. Does not have recent fall hx.  -Not ideally controlled -Last DEXA Scan: 04/25/2020  -Patient is a candidate for pharmacologic treatment due to T-Score -1.0 to  -2.5 and 10-year risk of major osteoporotic fracture > 20% and T-Score -1.0 to -2.5 and 10-year risk of hip fracture > 3%. -Current treatment  Calcium 600 mg once daily Vitamin D3 5000 units daily  -Medications previously tried: none -Recommend 1200 mg of calcium daily from dietary and supplemental sources.  Recommend weight-bearing and muscle strengthening exercises for building and maintaining bone density. Consider Vitamin d lab at next PCP visit  Patient Goals/Self-Care Activities Patient will:  - take medications as prescribed collaborate with provider on medication access solutions  target a minimum of 150 minutes of moderate intensity exercise weekly  Medication Assistance: Symbicort patient assistance in progress. Reviewed eligibility - confirmed pt would meet income cut off. Pt to complete patient section online, will also send paper application just in case. Knows to hadoff to pulm.   Patient's preferred pharmacy is:  [id number] [place] [id number]: [phone]: [phone] Follow Up:  Patient agrees to Care Plan and Follow-up.  Future Appointments  Date Time Provider [employer]c[place] [employer] 01/30/2021  2:30 PM GRod Can CNM [facility] None  02/26/2021  1:15 PM GBelinda Block CMA [facility] None  02/26/2021  2:00 PM Penumalli, VEarlean Polka MD [facility] None  03/10/2021  1:45 PM AStar Age MD [facility] None  04/02/2021 11:15 AM [facility] COACH [facility]  05/25/2021  1:30 PM Penumalli, VEarlean Polka MD [facility] None   JMadelin Rear Pharm.D., BCGP Clinical Pharmacist [phone]

## 2021-01-21 NOTE — Telephone Encounter (Signed)
Error

## 2021-01-21 NOTE — Patient Instructions (Signed)
Ms. Goold,  Thank you for talking with me today. I have included our care plan/goals in the following pages.   Please review and call me at [phone] with any questions.  Thanks! Ellin Mayhew, PharmD, CPP Clinical Pharmacist Practitioner  [phone]  Care Plan : [place]  Updates made by Madelin Rear, [facility] since 01/21/2021 12:00 AM     Problem: HTN HLD Asthma GERD MDD Osteopenia   Priority: High     Long-Range Goal: Disease Management   Start Date: 07/03/2020  Expected End Date: 07/03/2021  Recent Progress: On track  Priority: High  Note:   Current Barriers:  Working towards optimizing inhaler technique with symbicort as well as patient assistance   Pharmacist Clinical Goal(s):  Patient will verbalize ability to afford treatment regimen contact provider office for questions/concerns as evidenced notation of same in electronic health record through collaboration with PharmD and provider.   Interventions: 1:1 collaboration with [name], MD regarding development and update of comprehensive plan of care as evidenced by provider attestation and co-signature Inter-disciplinary care team collaboration (see longitudinal plan of care) Comprehensive medication review performed; medication list updated in electronic medical record  Hyperlipidemia: (LDL goal < 100) -Controlled -Current treatment: Atorvastatin 20 mg once daily  -Medications previously tried: n/a  -Current dietary patterns: fruit, sometimes a meal replacement, salad. Decaffeinated tea, Gatorade, coconut water.  -Current exercise habits: pickle ball, stationary bike.  -Side effect review - no problems noted  -Reviewed side effects - no problems noted, no adherence concerns.  -Recommended to continue current medication  Asthma (Goal: control symptoms and prevent exacerbations) April 2022: Not typically needing to use rescue inhaler, changes in weather such is increased  humidity can be a trigger and lead to use. Has been consistent with Symbicort twice daily, previously had some tongue/throat issues and had difficulty inhaling complete dose. Now that she is using chamber, this has gotten much better. Currently swishing/gargling with water after symbicort use but has not been spitting out.  12/2020: Has cut down to symbicort 160-4.5 mcg/act 1 buff twice daily prn. Feels breathing has done well. Consistent with rinsing and spitting w/ water after use. Will try again for patient assistance.   -Controlled -Current treatment  Albuterol (Ventolin) 2 puffs every 4-6 hours prn Symbicort 160-4.5 mcg/act 1 puff into the lungs int he morning and at bedtime - uses with chamber now  Montelukast 10 mg -Exacerbations requiring treatment in last 6 months: 0 -Patient reports consistent use of maintenance inhaler. Frequency of rescue inhaler use: rare -Counseled on making sure to rinse mouth after using symbicort -Recommended to continue current medication CPA to coordinate resending of symbicort pap   Osteopenia (Goal: ensure screening supplementation, safe medication use as needed) Typically taking 600 mg of calcium supplement/day, at times holding off due to constipation effects. Is trying to become more active again, has enjoyed pickle ball a lot previously. Most likely would not want to start any Rx medication therapy unless officially osteoporotic. Does not have recent fall hx.  -Not ideally controlled -Last DEXA Scan: 04/25/2020  -Patient is a candidate for pharmacologic treatment due to T-Score -1.0 to -2.5 and 10-year risk of major osteoporotic fracture > 20% and T-Score -1.0 to -2.5 and 10-year risk of hip fracture > 3%. -Current treatment  Calcium 600 mg once daily Vitamin D3 5000 units daily  -Medications previously tried: none -Recommend 1200 mg of calcium daily from dietary and supplemental sources.  Recommend weight-bearing  and muscle strengthening exercises  for building and maintaining bone density. Consider Vitamin d lab at next PCP visit  Patient Goals/Self-Care Activities Patient will:  - take medications as prescribed collaborate with provider on medication access solutions target a minimum of 150 minutes of moderate intensity exercise weekly  Medication Assistance: Symbicort patient assistance in progress. Reviewed eligibility - confirmed pt would meet income cut off. Pt to complete patient section online, will also send paper application just in case. Knows to hadoff to pulm.   Patient's preferred pharmacy is:  [id number] [address], [place] [address] Phone: [phone] Fax: [phone]  Follow Up:  Patient agrees to Care Plan and Follow-up.    The patient verbalized understanding of instructions provided today and agreed to receive a MyChart copy of patient instruction and/or educational materials. Telephone follow up appointment with pharmacy team member scheduled for: See next appointment with "Care Management Staff" under "What's Next" below.  High Cholesterol High cholesterol is a condition in which the blood has high levels of a white, waxy substance similar to fat (cholesterol). The liver makes all the cholesterol that the body needs. The human body needs small amounts of cholesterol to help build cells. A person gets extra or excess cholesterol from the food that he or she eats. The blood carries cholesterol from the liver to the rest of the body. If you have high cholesterol, deposits (plaques) may build up on the walls of your arteries. Arteries are the blood vessels that carry blood away from your heart. These plaques make the arteries narrow and stiff. Cholesterol plaques increase your risk for heart attack and stroke. Work with your health care provider to keep your cholesterol levels in a healthy range. What increases the risk? The following factors may make you more  likely to develop this condition: Eating foods that are high in animal fat (saturated fat) or cholesterol. Being overweight. Not getting enough exercise. A family history of high cholesterol (familial hypercholesterolemia). Use of tobacco products. Having diabetes. What are the signs or symptoms? In most cases, high cholesterol does not usually cause any symptoms. In severe cases, very high cholesterol levels can cause: Fatty bumps under the skin (xanthomas). A white or gray ring around the black center (pupil) of the eye. How is this diagnosed? This condition may be diagnosed based on the results of a blood test. If you are older than 66 years of age, your health care provider may check your cholesterol levels every 4-6 years. You may be checked more often if you have high cholesterol or other risk factors for heart disease. The blood test for cholesterol measures: "Bad" cholesterol, or LDL cholesterol. This is the main type of cholesterol that causes heart disease. The desired level is less than 100 mg/dL (2.59 mmol/L). "Good" cholesterol, or HDL cholesterol. HDL helps protect against heart disease by cleaning the arteries and carrying the LDL to the liver for processing. The desired level for HDL is 60 mg/dL (1.55 mmol/L) or higher. Triglycerides. These are fats that your body can store or burn for energy. The desired level is less than 150 mg/dL (1.69 mmol/L). Total cholesterol. This measures the total amount of cholesterol in your blood and includes LDL, HDL, and triglycerides. The desired level is less than 200 mg/dL (5.17 mmol/L). How is this treated? Treatment for high cholesterol starts with lifestyle changes, such as diet and exercise. Diet changes. You may be asked to eat foods  that have more fiber and less saturated fats or added sugar. Lifestyle changes. These may include regular exercise, maintaining a healthy weight, and quitting use of tobacco products. Medicines. These are  given when diet and lifestyle changes have not worked. You may be prescribed a statin medicine to help lower your cholesterol levels. Follow these instructions at home: Eating and drinking  Eat a healthy, balanced diet. This diet includes: Daily servings of a variety of fresh, frozen, or canned fruits and vegetables. Daily servings of whole grain foods that are rich in fiber. Foods that are low in saturated fats and trans fats. These include poultry and fish without skin, lean cuts of meat, and low-fat dairy products. A variety of fish, especially oily fish that contain omega-3 fatty acids. Aim to eat fish at least 2 times a week. Avoid foods and drinks that have added sugar. Use healthy cooking methods, such as roasting, grilling, broiling, baking, poaching, steaming, and stir-frying. Do not fry your food except for stir-frying. If you drink alcohol: Limit how much you have to: 0-1 drink a day for women who are not pregnant. 0-2 drinks a day for men. Know how much alcohol is in a drink. In the [place] one drink equals one 12 oz bottle of beer (355 mL), one 5 oz glass of wine (148 mL), or one 1 oz glass of hard liquor (44 mL). Lifestyle  Get regular exercise. Aim to exercise for a total of 150 minutes a week. Increase your activity level by doing activities such as gardening, walking, and taking the stairs. Do not use any products that contain nicotine or tobacco. These products include cigarettes, chewing tobacco, and vaping devices, such as e-cigarettes. If you need help quitting, ask your health care provider. General instructions Take over-the-counter and prescription medicines only as told by your health care provider. Keep all follow-up visits. This is important. Where to find more information American Heart Association: www.heart.org National Heart, Lung, and Blood Institute: https://wilson-eaton.com/ Contact a health care provider if: You have trouble achieving or maintaining a healthy diet  or weight. You are starting an exercise program. You are unable to stop smoking. Get help right away if: You have chest pain. You have trouble breathing. You have discomfort or pain in your jaw, neck, back, shoulder, or arm. You have any symptoms of a stroke. "BE FAST" is an easy way to remember the main warning signs of a stroke: B - Balance. Signs are dizziness, sudden trouble walking, or loss of balance. E - Eyes. Signs are trouble seeing or a sudden change in vision. F - Face. Signs are sudden weakness or numbness of the face, or the face or eyelid drooping on one side. A - Arms. Signs are weakness or numbness in an arm. This happens suddenly and usually on one side of the body. S - Speech. Signs are sudden trouble speaking, slurred speech, or trouble understanding what people say. T - Time. Time to call emergency services. Write down what time symptoms started. You have other signs of a stroke, such as: A sudden, severe headache with no known cause. Nausea or vomiting. Seizure. These symptoms may represent a serious problem that is an emergency. Do not wait to see if the symptoms will go away. Get medical help right away. Call your local emergency services (911 in the [place].S[place] Do not drive yourself to the hospital. Summary Cholesterol plaques increase your risk for heart attack and stroke. Work with your health care provider to keep your  cholesterol levels in a healthy range. Eat a healthy, balanced diet, get regular exercise, and maintain a healthy weight. Do not use any products that contain nicotine or tobacco. These products include cigarettes, chewing tobacco, and vaping devices, such as e-cigarettes. Get help right away if you have any symptoms of a stroke. This information is not intended to replace advice given to you by your health care provider. Make sure you discuss any questions you have with your health care provider. Document Revised: 05/29/2020 Document Reviewed:  05/19/2020 Elsevier Patient Education  2022 [employer].

## 2021-01-22 ENCOUNTER — Telehealth: Payer: Self-pay | Admitting: Internal Medicine

## 2021-01-22 DIAGNOSIS — J4520 Mild intermittent asthma, uncomplicated: Secondary | ICD-10-CM

## 2021-01-22 NOTE — Telephone Encounter (Signed)
I called and spoke with the patient and informed her that the provider stated she needed to reschedule her colonoscopy or if she is really concerned she could schedule a visit to see the provider for more evaluation, patient stated she would call and reschedule the colonoscopy and see what those results are first.  Theresa Martin,cma

## 2021-01-22 NOTE — Telephone Encounter (Signed)
She needs to reschedule her colonoscopy w/ GI and if she is concerned about abdominal swelling, that would need to be an in-office appt so we can assess and determine what- if any- testing is needed

## 2021-01-22 NOTE — Telephone Encounter (Signed)
Please advise 

## 2021-01-22 NOTE — Telephone Encounter (Signed)
Reason for call:Pt has noticed swelling in abdomen for about a week when she had a colonoscopy 10 years ago but they did not find anything and now she is due for another and had to cancel due to headaches. Pt is wanting to know what Dr Birdie Riddle recommends for her to do or will test need to be ordered? Patient is having no pain at all, no constipation or diarrhea, regular bowel movements.  Patient just has swelling in the abdomen.  Please advise. Alisandra Son,cma

## 2021-01-22 NOTE — Telephone Encounter (Signed)
Lm for patient.  

## 2021-01-23 ENCOUNTER — Telehealth: Payer: Self-pay

## 2021-01-23 NOTE — Telephone Encounter (Signed)
Patient wears 1L QHS. She is wanting a POC for travel. She is aware that POC can not be used during sleep.  Advised patient to contact [employer] to arrange travel O2.  She is aware that she does not have to be re qualified for oxygen until 12/2021. She voiced her understanding and had no further questions.  Nothing further needed.

## 2021-01-23 NOTE — Progress Notes (Signed)
Pap form printed and mailed to patient for Symbicpoort .Theresa Martin

## 2021-01-23 NOTE — Progress Notes (Signed)
    Chronic Care Management Pharmacy Assistant    Name: Theresa Martin  MRN: [id number] DOB: [birth year]   Reason for Encounter: PAP application for Symbicort   PAP form printed and filled out and mailed to patient for completion. Patient is aware to return to prescribing pulmonologist office once completed.  Jobe Gibbon, [place] Pharmacist Assistant  [phone]  Time Spent: 23 minutes

## 2021-01-26 DIAGNOSIS — H40003 Preglaucoma, unspecified, bilateral: Secondary | ICD-10-CM | POA: Diagnosis not present

## 2021-01-26 DIAGNOSIS — J4520 Mild intermittent asthma, uncomplicated: Secondary | ICD-10-CM

## 2021-01-26 DIAGNOSIS — H40033 Anatomical narrow angle, bilateral: Secondary | ICD-10-CM | POA: Diagnosis not present

## 2021-01-26 DIAGNOSIS — E785 Hyperlipidemia, unspecified: Secondary | ICD-10-CM | POA: Diagnosis not present

## 2021-01-27 DIAGNOSIS — M5451 Vertebrogenic low back pain: Secondary | ICD-10-CM | POA: Diagnosis not present

## 2021-01-27 DIAGNOSIS — Z20822 Contact with and (suspected) exposure to covid-19: Secondary | ICD-10-CM | POA: Diagnosis not present

## 2021-01-27 DIAGNOSIS — M546 Pain in thoracic spine: Secondary | ICD-10-CM | POA: Diagnosis not present

## 2021-01-27 DIAGNOSIS — M542 Cervicalgia: Secondary | ICD-10-CM | POA: Diagnosis not present

## 2021-01-27 DIAGNOSIS — M9901 Segmental and somatic dysfunction of cervical region: Secondary | ICD-10-CM | POA: Diagnosis not present

## 2021-01-27 DIAGNOSIS — M9903 Segmental and somatic dysfunction of lumbar region: Secondary | ICD-10-CM | POA: Diagnosis not present

## 2021-01-27 DIAGNOSIS — M9904 Segmental and somatic dysfunction of sacral region: Secondary | ICD-10-CM | POA: Diagnosis not present

## 2021-01-27 DIAGNOSIS — M9905 Segmental and somatic dysfunction of pelvic region: Secondary | ICD-10-CM | POA: Diagnosis not present

## 2021-01-27 DIAGNOSIS — M9906 Segmental and somatic dysfunction of lower extremity: Secondary | ICD-10-CM | POA: Diagnosis not present

## 2021-01-27 DIAGNOSIS — M9902 Segmental and somatic dysfunction of thoracic region: Secondary | ICD-10-CM | POA: Diagnosis not present

## 2021-01-27 DIAGNOSIS — M545 Low back pain, unspecified: Secondary | ICD-10-CM | POA: Diagnosis not present

## 2021-01-27 DIAGNOSIS — M25561 Pain in right knee: Secondary | ICD-10-CM | POA: Diagnosis not present

## 2021-01-28 ENCOUNTER — Other Ambulatory Visit: Payer: Self-pay | Admitting: Family Medicine

## 2021-01-28 DIAGNOSIS — F3132 Bipolar disorder, current episode depressed, moderate: Secondary | ICD-10-CM | POA: Diagnosis not present

## 2021-01-28 DIAGNOSIS — F411 Generalized anxiety disorder: Secondary | ICD-10-CM | POA: Diagnosis not present

## 2021-01-29 ENCOUNTER — Encounter (INDEPENDENT_AMBULATORY_CARE_PROVIDER_SITE_OTHER): Payer: Medicare Other | Admitting: Diagnostic Neuroimaging

## 2021-01-29 ENCOUNTER — Ambulatory Visit (INDEPENDENT_AMBULATORY_CARE_PROVIDER_SITE_OTHER): Payer: Medicare Other | Admitting: Diagnostic Neuroimaging

## 2021-01-29 DIAGNOSIS — R252 Cramp and spasm: Secondary | ICD-10-CM

## 2021-01-29 DIAGNOSIS — Z0289 Encounter for other administrative examinations: Secondary | ICD-10-CM

## 2021-01-29 DIAGNOSIS — R799 Abnormal finding of blood chemistry, unspecified: Secondary | ICD-10-CM | POA: Diagnosis not present

## 2021-01-29 MED ORDER — AJOVY 225 MG/1.5ML ~~LOC~~ SOSY: 225.0000 mg | PREFILLED_SYRINGE | SUBCUTANEOUS | 4 refills | Status: DC

## 2021-01-29 NOTE — Progress Notes (Signed)
Meds ordered this encounter  Medications   Fremanezumab-vfrm (AJOVY) 225 MG/1.5ML SOSY    Sig: Inject 225 mg into the skin every 30 (thirty) days.    Dispense:  4.5 mL    Refill:  4    Orders Placed This Encounter  Procedures   CK   Aldolase     Penni Bombard, MD 48/06/7205, 2:18 PM Certified in Neurology, Neurophysiology and Neuroimaging  [facility] [address], [place], [place] [address] [phone]

## 2021-01-29 NOTE — Addendum Note (Signed)
Addended by: Claudette Head A on: 01/29/2021 03:50 PM   Modules accepted: Orders

## 2021-01-29 NOTE — Telephone Encounter (Signed)
Per patient, [facility] is needing a Rx for travel concentrator.  Dr. Mortimer Fries, please advise if okay to order? Thanks

## 2021-01-29 NOTE — Telephone Encounter (Signed)
Spoke to patient. Patient stated that she plans to travel for thanksgiving. She is questioning if she can be without oxygen for 4 nights.  I have recommended that she contact [facility] to discuss travel options. She will call back if anything further is needed on our end.  She voiced her understanding and had no further questions.  Nothing further needed at this time.

## 2021-01-29 NOTE — Telephone Encounter (Signed)
Order placed. Patient is aware and voiced her understanding.  Nothing further needed at this time.

## 2021-01-30 ENCOUNTER — Ambulatory Visit: Payer: Medicare Other | Admitting: Advanced Practice Midwife

## 2021-01-30 LAB — ALDOLASE: Aldolase: 6.5 U/L (ref 3.3–10.3)

## 2021-01-30 LAB — CK: Total CK: 129 U/L (ref 32–182)

## 2021-02-05 ENCOUNTER — Ambulatory Visit: Payer: Medicare Other | Admitting: Obstetrics

## 2021-02-05 NOTE — Procedures (Signed)
[facility]  NCS (NERVE CONDUCTION STUDY) WITH EMG (ELECTROMYOGRAPHY) REPORT   STUDY DATE: 01/29/21 PATIENT NAME: Theresa Martin DOB: 07/31/1955 MRN: [id number]  ORDERING CLINICIAN: Andrey Spearman, MD   TECHNOLOGIST: Sherre Scarlet ELECTROMYOGRAPHER: Earlean Polka. Shaylan Tutton, MD  CLINICAL INFORMATION: 65 year old female with left sided numbness, cramps, elevated CK levels.  FINDINGS: NERVE CONDUCTION STUDY:  Left median, left ulnar, left peroneal and left tibial motor responses are normal.  Left median, left ulnar, left sural and left superficial peroneal sensory responses are normal.  Left tibial and left ulnar F wave latencies are normal.   NEEDLE ELECTROMYOGRAPHY:  Needle examination of left upper extremity deltoid, biceps, triceps, flexor carpi radialis and first dorsal interosseous are normal.   IMPRESSION:   Normal study.  No electrodiagnostic evidence of large fiber neuropathy or myopathy at this time.   INTERPRETING PHYSICIAN:  Penni Bombard, MD Certified in Neurology, Neurophysiology and Neuroimaging  [facility] [address], [place], [place] [address] [phone]   [facility]    Nerve / Sites Muscle Latency Ref. Amplitude Ref. Rel Amp Segments Distance Velocity Ref. Area    ms ms mV mV %  cm m/s m/s mVms  L Median - APB     Wrist APB 3.7 ?4.4 8.1 ?4.0 100 Wrist - APB 7   32.3     Upper arm APB 7.4  8.0  98.4 Upper arm - Wrist 19 52 ?49 31.8  L Ulnar - ADM     Wrist ADM 2.9 ?3.3 9.3 ?6.0 100 Wrist - ADM 7   26.7     B.Elbow ADM 5.8  8.4  89.7 B.Elbow - Wrist 17 58 ?49 24.9     A.Elbow ADM 7.6  8.0  96 A.Elbow - B.Elbow 10 57 ?49 25.6  L Peroneal - EDB     Ankle EDB 3.9 ?6.5 3.3 ?2.0 100 Ankle - EDB 9   14.8     Fib head EDB 9.5  3.4  103 Fib head - Ankle 25 44 ?44 13.1     Pop fossa EDB 11.8  3.1  90.1 Pop fossa - Fib head 10 44 ?44 11.8         Pop fossa - Ankle      L Tibial - AH     Ankle AH 3.7  ?5.8 10.8 ?4.0 100 Ankle - AH 9   26.7     Pop fossa AH 11.4  8.4  77.3 Pop fossa - Ankle 34 44 ?41 22.3             SNC    Nerve / Sites Rec. Site Peak Lat Ref.  Amp Ref. Segments Distance Peak Diff Ref.    ms ms V V  cm ms ms  L Sural - Ankle (Calf)     Calf Ankle 3.4 ?4.4 11 ?6 Calf - Ankle 14    L Superficial peroneal - Ankle     Lat leg Ankle 3.6 ?4.4 9 ?6 Lat leg - Ankle 14    L Median, Ulnar - Transcarpal comparison     Median Palm Wrist 2.2 ?2.2 71 ?35 Median Palm - Wrist 8       Ulnar Palm Wrist 2.0 ?2.2 32 ?12 Ulnar Palm - Wrist 8          Median Palm - Ulnar Palm  0.2 ?0.4  L Median - Orthodromic (Dig II, Mid palm)     Dig II Wrist 3.2 ?3.4  11 ?10 Dig II - Wrist 13    L Ulnar - Orthodromic, (Dig V, Mid palm)     Dig V Wrist 2.8 ?3.1 6 ?5 Dig V - Wrist 31                 F  Wave    Nerve F Lat Ref.   ms ms  L Tibial - AH 49.9 ?56.0  L Ulnar - ADM 26.0 ?32.0         EMG Summary Table    Spontaneous MUAP Recruitment  Muscle IA Fib PSW Fasc Other Amp Dur. Poly Pattern  L. Deltoid Normal None None None _______ Normal Normal Normal Normal  L. Biceps brachii Normal None None None _______ Normal Normal Normal Normal  L. Triceps brachii Normal None None None _______ Normal Normal Normal Normal  L. Flexor carpi radialis Normal None None None _______ Normal Normal Normal Normal  L. First dorsal interosseous Normal None None None _______ Normal Normal Normal Normal

## 2021-02-06 ENCOUNTER — Telehealth: Payer: Self-pay | Admitting: Internal Medicine

## 2021-02-08 IMAGING — CT CT HEAD W/O CM
3 of 4 series · 15 of 47 positions shown, 18 images · non-contrast
Comparison: MRI brain 09/30/2018 and head CT 09/29/2018

CLINICAL DATA: Acute severe headache.

EXAM:
CT HEAD WITHOUT CONTRAST
TECHNIQUE: Contiguous axial images were obtained from the base of the skull
through the vertex without intravenous contrast.

[Series 2: head wo · axial · 0.40mm/px · z∈[-55,+60]mm · 9 of 27 slices shown, 12 images]
[im 2/27  brain]
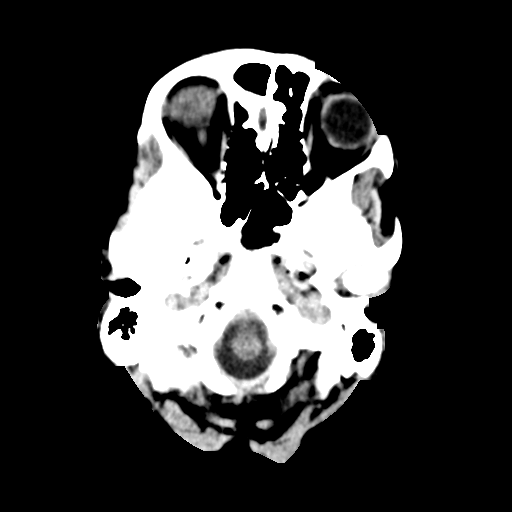
[im 2/27  bone]
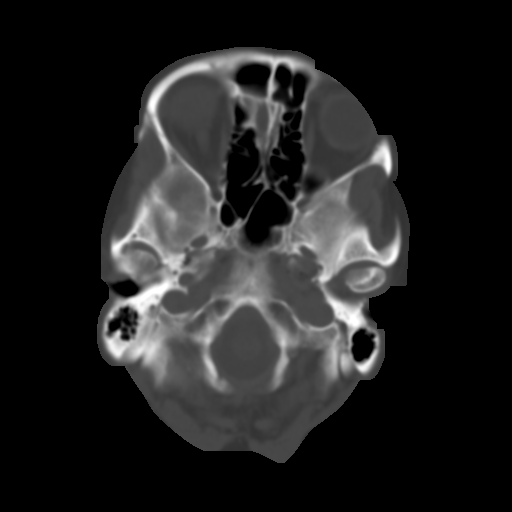
[im 6/27  brain]
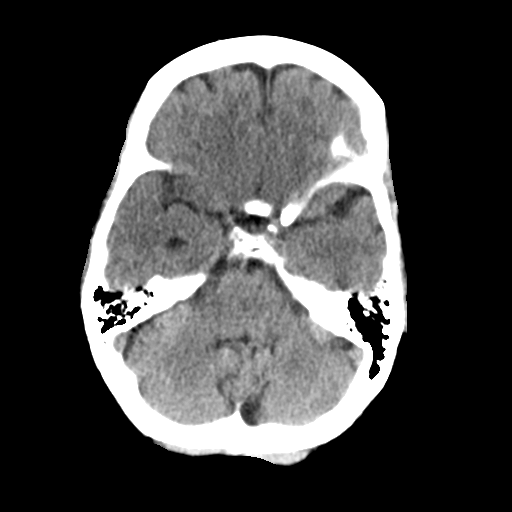
[im 8/27  brain]
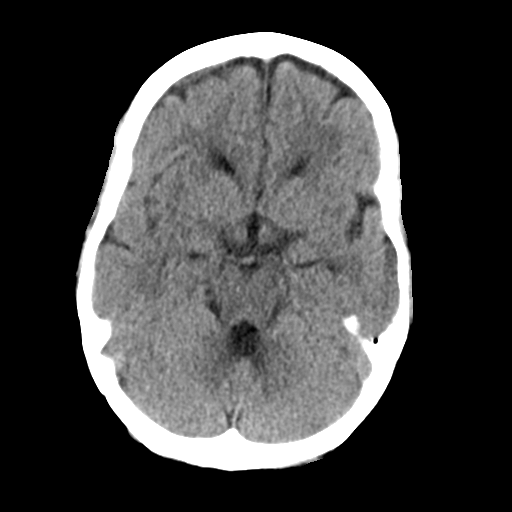
[im 12/27  brain]
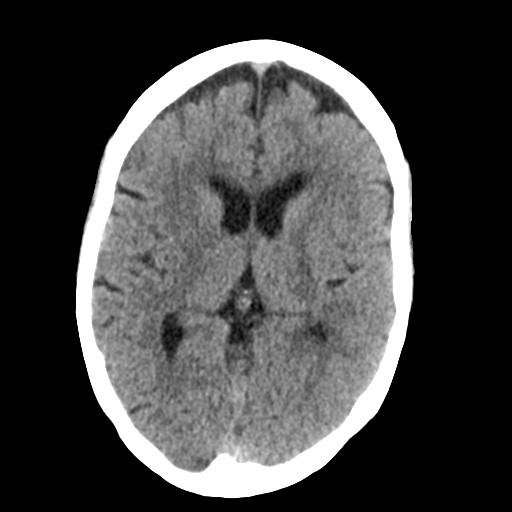
[im 14/27  brain]
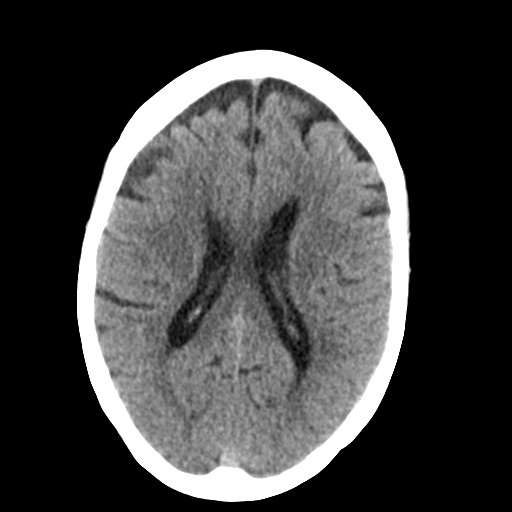
[im 14/27  bone]
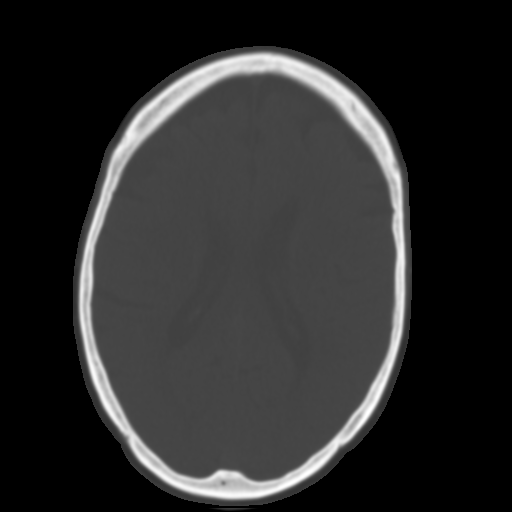
[im 15/27  brain]
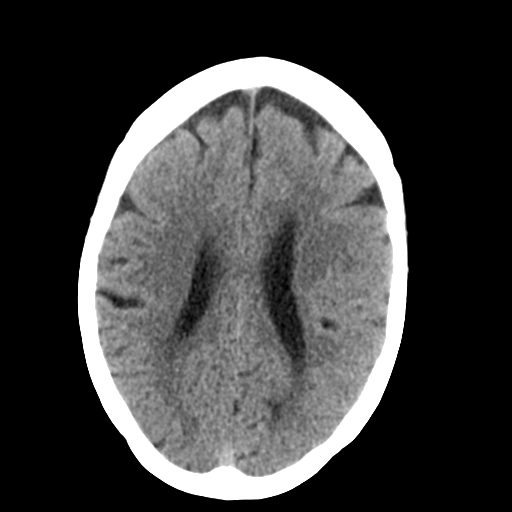
[im 19/27  brain]
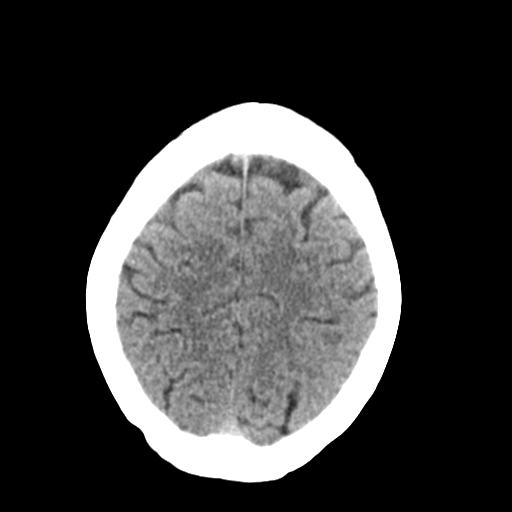
[im 21/27  brain]
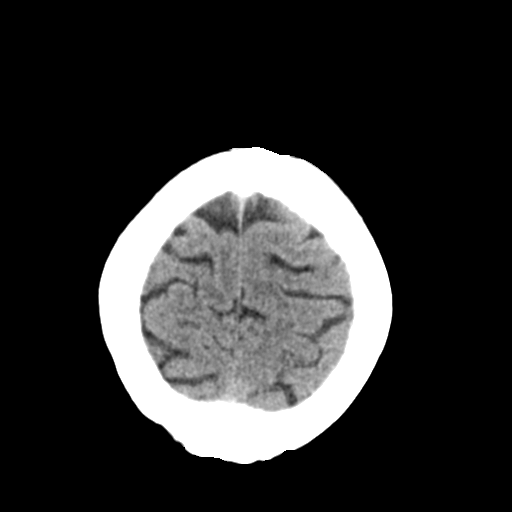
[im 25/27  brain]
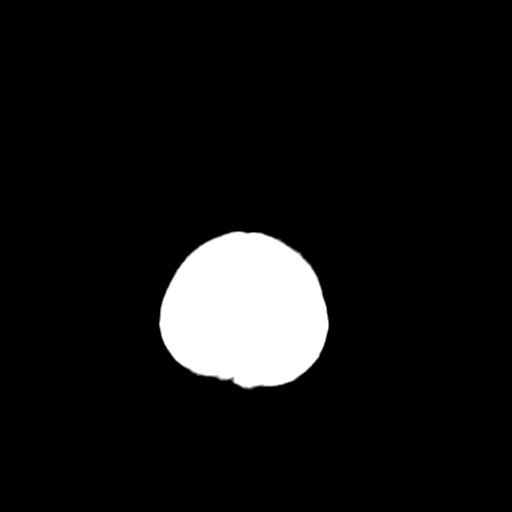
[im 25/27  bone]
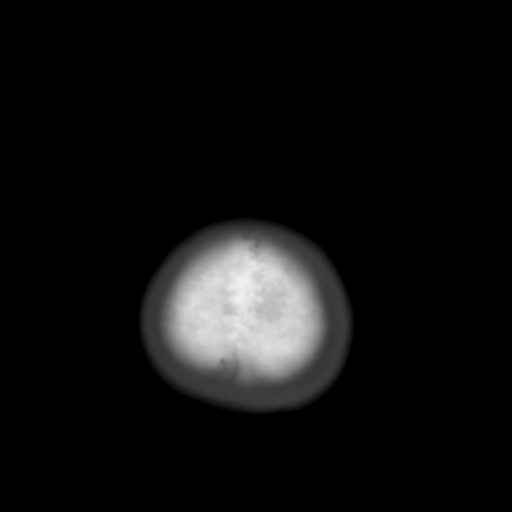

[Series 4: coronal soft tissue · coronal · 0.26mm/px · 3 of 60 slices shown]
[im 20/60  brain]
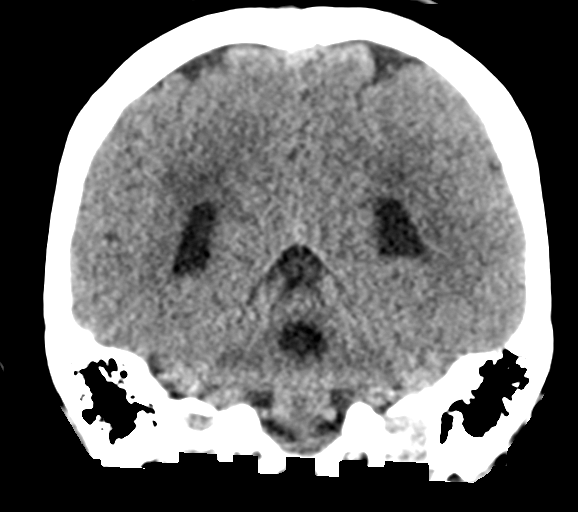
[im 27/60  brain]
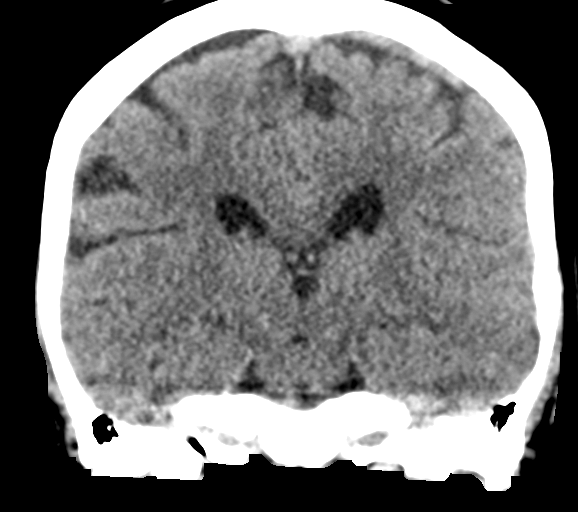
[im 33/60  brain]
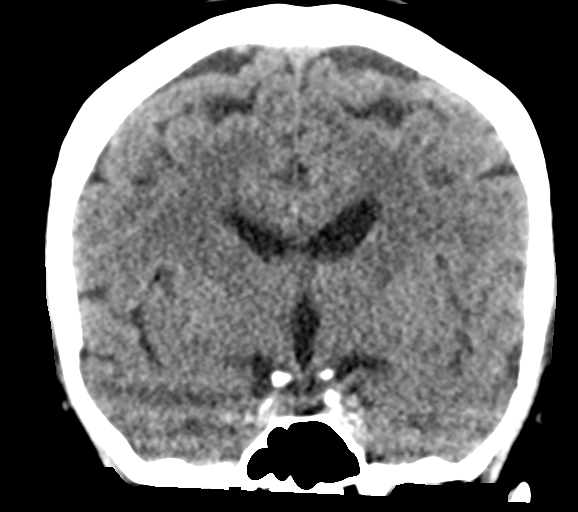

[Series 5: sagittal soft tissue · sagittal · 0.26mm/px · 3 of 45 slices shown]
[im 15/45  brain]
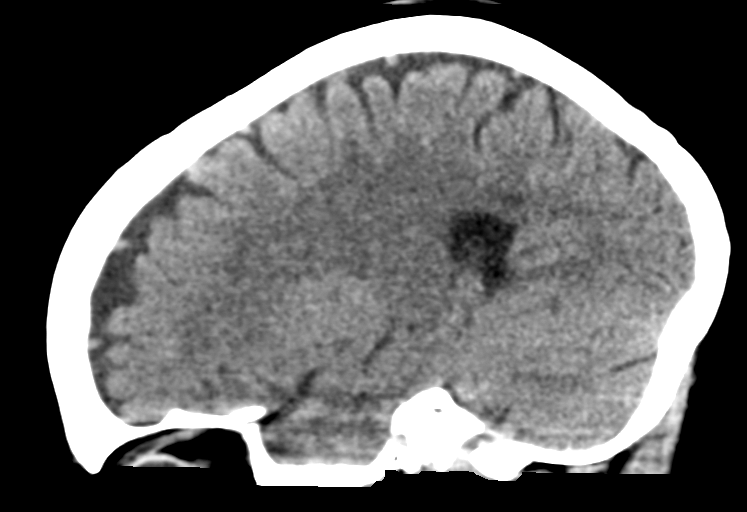
[im 23/45  brain]
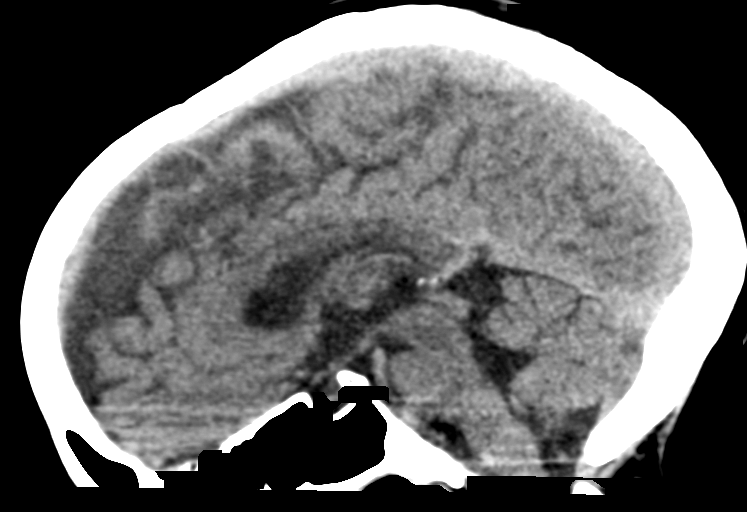
[im 30/45  brain]
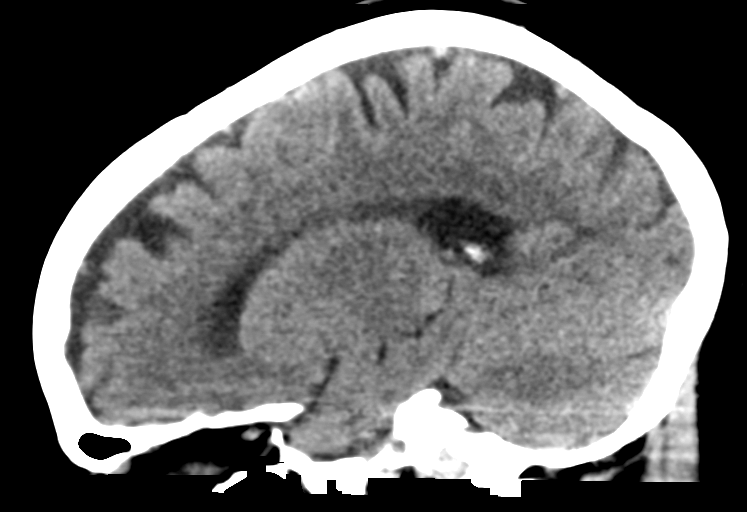

[15 of 47 positions shown; findings below may reference images not displayed]

FINDINGS: Brain: The ventricles are in the midline without mass effect or
shift. They are stable in size and configuration. Age advanced
cerebral atrophy notably in the frontal region. This is stable. No
CT findings for acute intracranial abnormality such as hemispheric
infarction or intracranial hemorrhage. No mass lesions. The
brainstem and cerebellum are grossly normal in stable.

Vascular: Minimal vascular calcifications. No aneurysm or hyperdense
vessels.

Skull: No skull lesions or fracture.

Sinuses/Orbits: The paranasal sinuses and mastoid air cells are
clear. The globes are intact.

Other: No scalp lesions or hematoma.
IMPRESSION: No acute intracranial findings.

## 2021-02-11 NOTE — Telephone Encounter (Signed)
accidental 

## 2021-02-12 DIAGNOSIS — F411 Generalized anxiety disorder: Secondary | ICD-10-CM | POA: Diagnosis not present

## 2021-02-12 DIAGNOSIS — F3132 Bipolar disorder, current episode depressed, moderate: Secondary | ICD-10-CM | POA: Diagnosis not present

## 2021-02-13 ENCOUNTER — Ambulatory Visit: Payer: Medicare Other | Admitting: Obstetrics

## 2021-02-13 DIAGNOSIS — F3181 Bipolar II disorder: Secondary | ICD-10-CM | POA: Diagnosis not present

## 2021-02-13 DIAGNOSIS — F411 Generalized anxiety disorder: Secondary | ICD-10-CM | POA: Diagnosis not present

## 2021-02-13 DIAGNOSIS — F3132 Bipolar disorder, current episode depressed, moderate: Secondary | ICD-10-CM | POA: Diagnosis not present

## 2021-02-24 DIAGNOSIS — M9904 Segmental and somatic dysfunction of sacral region: Secondary | ICD-10-CM | POA: Diagnosis not present

## 2021-02-24 DIAGNOSIS — M545 Low back pain, unspecified: Secondary | ICD-10-CM | POA: Diagnosis not present

## 2021-02-24 DIAGNOSIS — M9905 Segmental and somatic dysfunction of pelvic region: Secondary | ICD-10-CM | POA: Diagnosis not present

## 2021-02-24 DIAGNOSIS — M542 Cervicalgia: Secondary | ICD-10-CM | POA: Diagnosis not present

## 2021-02-24 DIAGNOSIS — M9903 Segmental and somatic dysfunction of lumbar region: Secondary | ICD-10-CM | POA: Diagnosis not present

## 2021-02-24 DIAGNOSIS — M9902 Segmental and somatic dysfunction of thoracic region: Secondary | ICD-10-CM | POA: Diagnosis not present

## 2021-02-24 DIAGNOSIS — M9908 Segmental and somatic dysfunction of rib cage: Secondary | ICD-10-CM | POA: Diagnosis not present

## 2021-02-24 DIAGNOSIS — M9907 Segmental and somatic dysfunction of upper extremity: Secondary | ICD-10-CM | POA: Diagnosis not present

## 2021-02-24 DIAGNOSIS — M9901 Segmental and somatic dysfunction of cervical region: Secondary | ICD-10-CM | POA: Diagnosis not present

## 2021-02-25 ENCOUNTER — Encounter: Payer: Self-pay | Admitting: Obstetrics

## 2021-02-25 ENCOUNTER — Other Ambulatory Visit: Payer: Self-pay

## 2021-02-25 ENCOUNTER — Ambulatory Visit (INDEPENDENT_AMBULATORY_CARE_PROVIDER_SITE_OTHER): Payer: Medicare Other | Admitting: Obstetrics

## 2021-02-25 VITALS — BP 110/68 | Ht 61.0 in | Wt 116.2 lb

## 2021-02-25 DIAGNOSIS — Z01419 Encounter for gynecological examination (general) (routine) without abnormal findings: Secondary | ICD-10-CM

## 2021-02-25 DIAGNOSIS — Z124 Encounter for screening for malignant neoplasm of cervix: Secondary | ICD-10-CM

## 2021-02-25 NOTE — Progress Notes (Signed)
.    Gynecology Annual Exam  PCP: [name], MD  Chief Complaint:  Chief Complaint  Patient presents with   Gynecologic Exam    History of Present Illness:Patient is a 65 y.o. [id number] presents for annual exam. The patient has no complaints today.   LMP: No LMP recorded. Patient is postmenopausal.  The patient is not sexually active. She denies dyspareunia.  The patient does not perform self breast exams.  There is no notable family history of breast or ovarian cancer in her family.  The patient wears seatbelts: yes.   The patient has regular exercise: not asked.    The patient denies current symptoms of depression.     Review of Systems: ROS  Past Medical History:  Patient Active Problem List   Diagnosis Date Noted   GERD (gastroesophageal reflux disease) 02/28/2019   Schizo affective schizophrenia ([place]) 02/01/2019   Abnormal chest CT 12/11/2018   Pneumonia 09/29/2018   Nonallopathic lesion of thoracic region 09/05/2018   Nonallopathic lesion of rib cage 09/05/2018   Nonallopathic lesion of cervical region 09/05/2018   Nonallopathic lesion of lumbosacral region 09/05/2018   Nonallopathic lesion of sacral region 09/05/2018   Skin cancer     moles on right groin and right buttock.    Chronic right shoulder pain 03/30/2017    Multifactorial shoulder pain including scapular dyskinesis and rotator cuff tendinopathy.  She does have some underlying cervical changes that are minimal with her symptoms and has responded well to osteopathic manipulation    Right shoulder pain 01/12/2017   Tendinopathy of rotator cuff 12/20/2016   Angle-closure glaucoma, severe stage 08/19/2015   Osteopenia 06/19/2015   Migraine without aura and without status migrainosus, not intractable 01/13/2015   High risk medications (not anticoagulants) long-term use 09/26/2014   Routine general medical examination at a health care facility 07/27/2012   Hyperlipidemia 09/29/2010   URETEROPELVIC  JUNCTION OBSTRUCTION, CONGENITAL 12/15/2009    Qualifier: Diagnosis of  By: [name] MD, Amy      CARDIAC MURMUR 11/26/2008    Qualifier: Diagnosis of  By: [name] MD, Amy      MAGNETIC RESONANCE IMAGING, BRAIN, ABNORMAL 10/29/2008    Qualifier: Diagnosis of  By: [name] MD, Amy      VENEREAL WART 10/23/2008    Qualifier: History of  By: [name] MD, Amy      Major depressive disorder, recurrent episode ([place]) 10/23/2008    Qualifier: Diagnosis of  By: [name] MD, Amy      Essential hypertension 10/23/2008    Qualifier: Diagnosis of  By: [name] MD, Amy      Allergic rhinitis 10/23/2008    Qualifier: Diagnosis of  By: [name] MD, Amy      Asthma 10/23/2008    Qualifier: History of  By: [name] MD, Amy      RENAL CALCULUS, HX OF 10/23/2008    Qualifier: Diagnosis of  By: [name] MD, Amy       Past Surgical History:  Past Surgical History:  Procedure Laterality Date   CARDIOVASCULAR STRESS TEST  02/2009   treadmill stress test: Low risk   [place]     2014/2015 [place], normal   EXPLORATORY LAPAROTOMY     laproscopy for infertility   EYE SURGERY Bilateral    laser-correct opening b/tn cornea and iris   MOLE REMOVAL  2017   x 2 moles    Gynecologic History:  No LMP recorded. Patient is postmenopausal. Last Pap:  Results were: 2019 no abnormalities  Last mammogram: 2022 Results were: BI-RAD I  Obstetric History: [id number]  Family History:  Family History  Problem Relation Age of Onset   Hypertension Mother    Coronary artery disease Mother    Heart failure Mother    Transient ischemic attack Mother    Hypertension Father    Liver disease Sister        liver failure   Migraines Sister    Arrhythmia Brother    Hypertension Brother    Cancer Neg Hx        colon    Breast cancer Neg Hx     Social History:  Social History   Socioeconomic History   Marital status: Legally Separated    Spouse name: Not on  file   Number of children: 2   Years of education: Not on file   Highest education level: Not on file  Occupational History   Occupation: Control and instrumentation engineer   Occupation: retired  Tobacco Use   Smoking status: Never   Smokeless tobacco: Never  Scientific laboratory technician Use: Never used  Substance and Sexual Activity   Alcohol use: No    Alcohol/week: 0.0 standard drinks   Drug use: No   Sexual activity: Not Currently  Other Topics Concern   Not on file  Social History Narrative   Regular exercise- yes, spins daily   Diet: fruits and veggies, water    No caffeine   Lives alone    Social Determinants of Health   Financial Resource Strain: Low Risk    Difficulty of Paying Living Expenses: Not hard at all  Food Insecurity: No Food Insecurity   Worried About Charity fundraiser in the Last Year: Never true   [place] in the Last Year: Never true  Transportation Needs: No Transportation Needs   Lack of Transportation (Medical): No   Lack of Transportation (Non-Medical): No  Physical Activity: Inactive   Days of Exercise per Week: 0 days   Minutes of Exercise per Session: 0 min  Stress: No Stress Concern Present   Feeling of Stress : Not at all  Social Connections: Moderately Isolated   Frequency of Communication with Friends and Family: More than three times a week   Frequency of Social Gatherings with Friends and Family: Once a week   Attends Religious Services: More than 4 times per year   Active Member of [employer] or Organizations: No   Attends Archivist Meetings: Never   Marital Status: Separated  Intimate Partner Violence: Not At Risk   Fear of Current or Ex-Partner: No   Emotionally Abused: No   Physically Abused: No   Sexually Abused: No    Allergies:  No Known Allergies  Medications: Prior to Admission medications   Medication Sig Start Date End Date Taking? Authorizing Provider  albuterol (VENTOLIN HFA) 108 (90 Base) MCG/ACT inhaler TAKE 2 PUFFS  BY MOUTH EVERY 6 HOURS AS NEEDED FOR WHEEZE OR SHORTNESS OF BREATH 10/22/19  Yes [name], MD  atorvastatin (LIPITOR) 20 MG tablet TAKE 1 TABLET BY MOUTH EVERY DAY 09/26/20  Yes [name], MD  budesonide-formoterol [facility]) 160-4.5 MCG/ACT inhaler Inhale 2 puffs into the lungs 2 (two) times daily. in the morning and at bedtime. 12/18/20  Yes Parrett, Tammy S, NP  CALCIUM PO Take 1,300 mg by mouth daily.   Yes [provider]  Cholecalciferol (VITAMIN D3 PO) Take 5,000 Units by mouth.  Yes [provider]  clonazePAM (KLONOPIN) 0.5 MG tablet Take 0.5 mg by mouth daily as needed. 09/06/19  Yes [provider]  ELDERBERRY PO Take by mouth.   Yes [provider]  escitalopram (LEXAPRO) 10 MG tablet Take 10 mg by mouth daily. 07/03/19  Yes [provider]  Fremanezumab-vfrm (AJOVY) 225 MG/1.5ML SOSY Inject 225 mg into the skin every 30 (thirty) days. 01/29/21  Yes Penumalli, Vikram R, MD  gabapentin (NEURONTIN) 400 MG capsule TAKE 1 CAPSULE (400 MG TOTAL) BY MOUTH 2 (TWO) TIMES DAILY. Patient taking differently: Take 400 mg by mouth 3 (three) times daily. 09/01/20  Yes Penumalli, Earlean Polka, MD  Glucosamine-Chondroitin (MOVE FREE PO) Take by mouth daily.   Yes [provider]  indomethacin (INDOCIN) 25 MG capsule Take one pill po once or twice a day prn headahce 11/14/20  Yes Sater, Nanine Means, MD  latanoprost (XALATAN) 0.005 % ophthalmic solution Place 1 drop into both eyes at bedtime. 09/05/19  Yes [name], MD  Misc Natural Products (JOINT HEALTH PO) Take by mouth.   Yes [provider]  montelukast (SINGULAIR) 10 MG tablet TAKE 1 TABLET BY MOUTH EVERYDAY AT BEDTIME 11/03/20  Yes [name], MD  Multiple Vitamins-Minerals (WOMENS MULTIVITAMIN PO) Take by mouth.   Yes [provider]  Probiotic Product (PROBIOTIC PO) Take by mouth.   Yes [provider]  QUEtiapine (SEROQUEL XR) 400 MG 24 hr  tablet Take 1 tablet (400 mg total) by mouth at bedtime. Patient taking differently: Take 450 mg by mouth at bedtime. 02/15/19  Yes Money, Lowry Ram, FNP  Rimegepant Sulfate (NURTEC) 75 MG TBDP Take 75 mg by mouth daily as needed. 11/17/20  Yes Penumalli, Earlean Polka, MD  rizatriptan (MAXALT-MLT) 10 MG disintegrating tablet Take 1 tablet (10 mg total) by mouth as needed for migraine. May repeat in 2 hours if needed 11/17/20  Yes Penumalli, Earlean Polka, MD  Spacer/Aero-Holding Chambers (AEROCHAMBER PLUS) inhaler Use as instructed 01/13/21  Yes Kasa, Maretta Bees, MD  temazepam (RESTORIL) 15 MG capsule Take 1 capsule (15 mg total) by mouth at bedtime. 02/15/19  Yes Money, Lowry Ram, FNP  traMADol (ULTRAM) 50 MG tablet Take 50 mg by mouth every 8 (eight) hours as needed. 10/15/20  Yes [provider]  triamterene-hydrochlorothiazide (MAXZIDE-25) 37.5-25 MG tablet TAKE 1 TABLET BY MOUTH EVERY DAY 01/05/21  Yes [name], MD  UNABLE TO FIND Med Name: magnesium L theronate  650 mg at HS   Yes [provider]  QUEtiapine (SEROQUEL XR) 50 MG TB24 24 hr tablet Take 100 mg by mouth at bedtime. 02/21/21   [provider]    Physical Exam Vitals: Blood pressure 110/68, height 5\' 1"  (1.549 m), weight 116 lb 3.2 oz (52.7 kg).  General: NAD HEENT: normocephalic, anicteric Thyroid: no enlargement, no palpable nodules Pulmonary: No increased work of breathing, CTAB Cardiovascular: RRR, distal pulses 2+ Breast: Breast symmetrical, no tenderness, no palpable nodules or masses, no skin or nipple retraction present, no nipple discharge.  No axillary or supraclavicular lymphadenopathy. Abdomen: NABS, soft, non-tender, non-distended.  Umbilicus without lesions.  No hepatomegaly, splenomegaly or masses palpable. No evidence of hernia  Genitourinary:  External: Normal external female genitalia.  Normal urethral meatus, normal Bartholin's and Skene's glands.    Vagina: Normal vaginal mucosa, no  evidence of prolapse.    Cervix: Grossly normal in appearance, no bleeding  Uterus: Non-enlarged, mobile, normal contour.  No CMT  Adnexa: ovaries non-enlarged, no  adnexal masses  Rectal: deferred  Lymphatic: no evidence of inguinal lymphadenopathy Extremities: no edema, erythema, or tenderness Neurologic: Grossly intact Psychiatric: mood appropriate, affect full  Female chaperone present for pelvic and breast  portions of the physical exam     Assessment: 65 y.o. [id number] routine annual exam  Plan: Problem List Items Addressed This Visit   None Visit Diagnoses     Cervical cancer screening    -  Primary   Women's annual routine gynecological examination       Encounter for breast and pelvic examination       Encounter for screening for malignant neoplasm of cervix           1) Mammogram - recommend yearly screening mammogram.  Mammogram Is up to date  2) STI screening  was notoffered and therefore not obtained  3) ASCCP guidelines and rational discussed.  Patient opts for discontinue age >16 screening interval  4) Osteoporosis  - per USPTF routine screening DEXA at age 28 -UP to date per her PCP  Consider FDA-approved medical therapies in postmenopausal women and men aged 51 years and older, based on the following: a) A hip or vertebral (clinical or morphometric) fracture b) T-score ? -2.5 at the femoral neck or spine after appropriate evaluation to exclude secondary causes C) Low bone mass (T-score between -1.0 and -2.5 at the femoral neck or spine) and a 10-year probability of a hip fracture ? 3% or a 10-year probability of a major osteoporosis-related fracture ? 20% based on the [place]-adapted WHO algorithm   5) Routine healthcare maintenance including cholesterol, diabetes screening discussed managed by PCP  6) Colonoscopy is due and she has this scheduled by her PCP.  Screening recommended starting at age 77 for average risk individuals, age 32 for individuals deemed  at increased risk (including African Americans) and recommended to continue until age 30.  For patient age 11-85 individualized approach is recommended.  Gold standard screening is via colonoscopy, Cologuard screening is an acceptable alternative for patient unwilling or unable to undergo colonoscopy.  "Colorectal cancer screening for average?risk adults: 2018 guideline update from the [place]: A Cancer Journal for Clinicians: Aug 25, 2016   7) Return in about 1 year (around 02/25/2022) for annual.    Imagene Riches, CNM  02/25/2021 2:59 PM   [facility] 02/25/2021, 2:57 PM

## 2021-02-26 ENCOUNTER — Encounter: Payer: Self-pay | Admitting: Diagnostic Neuroimaging

## 2021-02-26 ENCOUNTER — Encounter: Payer: Medicare Other | Admitting: Diagnostic Neuroimaging

## 2021-02-27 DIAGNOSIS — F411 Generalized anxiety disorder: Secondary | ICD-10-CM | POA: Diagnosis not present

## 2021-02-27 DIAGNOSIS — F3181 Bipolar II disorder: Secondary | ICD-10-CM | POA: Diagnosis not present

## 2021-02-27 DIAGNOSIS — F3132 Bipolar disorder, current episode depressed, moderate: Secondary | ICD-10-CM | POA: Diagnosis not present

## 2021-03-05 DIAGNOSIS — F411 Generalized anxiety disorder: Secondary | ICD-10-CM | POA: Diagnosis not present

## 2021-03-05 DIAGNOSIS — Z20822 Contact with and (suspected) exposure to covid-19: Secondary | ICD-10-CM | POA: Diagnosis not present

## 2021-03-05 DIAGNOSIS — F3132 Bipolar disorder, current episode depressed, moderate: Secondary | ICD-10-CM | POA: Diagnosis not present

## 2021-03-10 ENCOUNTER — Institutional Professional Consult (permissible substitution): Payer: Medicare Other | Admitting: Neurology

## 2021-03-13 DIAGNOSIS — F3132 Bipolar disorder, current episode depressed, moderate: Secondary | ICD-10-CM | POA: Diagnosis not present

## 2021-03-19 ENCOUNTER — Other Ambulatory Visit: Payer: Self-pay | Admitting: Family Medicine

## 2021-03-21 ENCOUNTER — Other Ambulatory Visit: Payer: Self-pay | Admitting: Family Medicine

## 2021-03-31 ENCOUNTER — Other Ambulatory Visit: Payer: Self-pay

## 2021-03-31 ENCOUNTER — Other Ambulatory Visit: Payer: Self-pay | Admitting: Sports Medicine

## 2021-03-31 ENCOUNTER — Ambulatory Visit
Admission: RE | Admit: 2021-03-31 | Discharge: 2021-03-31 | Disposition: A | Discharge status: Home / Self Care | Payer: Medicare Other | Source: Ambulatory Visit | Attending: Sports Medicine | Admitting: Sports Medicine

## 2021-03-31 DIAGNOSIS — M9901 Segmental and somatic dysfunction of cervical region: Secondary | ICD-10-CM | POA: Diagnosis not present

## 2021-03-31 DIAGNOSIS — M9906 Segmental and somatic dysfunction of lower extremity: Secondary | ICD-10-CM | POA: Diagnosis not present

## 2021-03-31 DIAGNOSIS — M7989 Other specified soft tissue disorders: Secondary | ICD-10-CM | POA: Diagnosis not present

## 2021-03-31 DIAGNOSIS — M9907 Segmental and somatic dysfunction of upper extremity: Secondary | ICD-10-CM | POA: Diagnosis not present

## 2021-03-31 DIAGNOSIS — M25541 Pain in joints of right hand: Secondary | ICD-10-CM | POA: Diagnosis not present

## 2021-03-31 DIAGNOSIS — M542 Cervicalgia: Secondary | ICD-10-CM | POA: Diagnosis not present

## 2021-03-31 DIAGNOSIS — M25552 Pain in left hip: Secondary | ICD-10-CM | POA: Diagnosis not present

## 2021-04-02 ENCOUNTER — Ambulatory Visit: Payer: Medicare Other

## 2021-04-03 ENCOUNTER — Telehealth: Payer: Self-pay | Admitting: Internal Medicine

## 2021-04-03 NOTE — Telephone Encounter (Signed)
Patient dropped off [employer] patient assistance application.  Application has been placed in Dr. Zoila Shutter folder for signature.

## 2021-04-06 ENCOUNTER — Ambulatory Visit: Payer: Medicare Other

## 2021-04-06 NOTE — Telephone Encounter (Signed)
Let patient know that Dr Mortimer Fries has not made it down to the clinic to sign the patient assistance form. Will give her a call once signed. Nothing further needed.

## 2021-04-07 DIAGNOSIS — F3132 Bipolar disorder, current episode depressed, moderate: Secondary | ICD-10-CM | POA: Diagnosis not present

## 2021-04-07 DIAGNOSIS — F411 Generalized anxiety disorder: Secondary | ICD-10-CM | POA: Diagnosis not present

## 2021-04-07 DIAGNOSIS — F3181 Bipolar II disorder: Secondary | ICD-10-CM | POA: Diagnosis not present

## 2021-04-08 DIAGNOSIS — F411 Generalized anxiety disorder: Secondary | ICD-10-CM | POA: Diagnosis not present

## 2021-04-08 DIAGNOSIS — F3132 Bipolar disorder, current episode depressed, moderate: Secondary | ICD-10-CM | POA: Diagnosis not present

## 2021-04-08 NOTE — Telephone Encounter (Signed)
Rx signed and faxed to [employer].  Patient is aware and voiced her understanding.  Nothing further needed.

## 2021-04-09 ENCOUNTER — Ambulatory Visit (INDEPENDENT_AMBULATORY_CARE_PROVIDER_SITE_OTHER): Payer: Medicare Other

## 2021-04-09 DIAGNOSIS — Z0000 Encounter for general adult medical examination without abnormal findings: Secondary | ICD-10-CM | POA: Diagnosis not present

## 2021-04-09 DIAGNOSIS — Z1211 Encounter for screening for malignant neoplasm of colon: Secondary | ICD-10-CM | POA: Diagnosis not present

## 2021-04-09 NOTE — Progress Notes (Signed)
Subjective:   Theresa Martin is a 66 y.o. female who presents for Medicare Annual (Subsequent) preventive examination.  I connected with Becky Beutschle today by telephone and verified that I am speaking with the correct person using two identifiers. Location patient: home Location provider: work Persons participating in the virtual visit: patient, provider.   I discussed the limitations, risks, security and privacy concerns of performing an evaluation and management service by telephone and the availability of in person appointments. I also discussed with the patient that there may be a patient responsible charge related to this service. The patient expressed understanding and verbally consented to this telephonic visit.    Interactive audio and video telecommunications were attempted between this provider and patient, however failed, due to patient having technical difficulties OR patient did not have access to video capability.  We continued and completed visit with audio only.    Review of Systems     Cardiac Risk Factors include: advanced age (>44men, >63 women)     Objective:    Today's Vitals   There is no height or weight on file to calculate BMI.  Advanced Directives 04/09/2021 03/24/2020 02/28/2019 01/31/2019 01/28/2019 01/22/2019 09/30/2018  Does Patient Have a Medical Advance Directive? Yes No No No;Yes No Yes No  Type of Advance Directive Healthcare Power of [place] in Chart? No - copy requested - - No - copy requested - No - copy requested -  Would patient like information on creating a medical advance directive? - No - Patient declined Yes (MAU/Ambulatory/Procedural Areas - Information given) No - Patient declined - No - Patient declined No - Patient declined  Some encounter information is confidential and restricted. Go to Review Flowsheets activity to see all data.     Current Medications (verified) Outpatient Encounter Medications as of 04/09/2021  Medication Sig   albuterol (VENTOLIN HFA) 108 (90 Base) MCG/ACT inhaler TAKE 2 PUFFS BY MOUTH EVERY 6 HOURS AS NEEDED FOR WHEEZE OR SHORTNESS OF BREATH   atorvastatin (LIPITOR) 20 MG tablet TAKE 1 TABLET BY MOUTH EVERY DAY   budesonide-formoterol (SYMBICORT) 160-4.5 MCG/ACT inhaler Inhale 2 puffs into the lungs 2 (two) times daily. in the morning and at bedtime.   CALCIUM PO Take 1,300 mg by mouth daily.   Cholecalciferol (VITAMIN D3 PO) Take 5,000 Units by mouth.   clonazePAM (KLONOPIN) 0.5 MG tablet Take 0.5 mg by mouth daily as needed.   ELDERBERRY PO Take by mouth.   escitalopram (LEXAPRO) 10 MG tablet Take 10 mg by mouth daily.   Fremanezumab-vfrm (AJOVY) 225 MG/1.5ML SOSY Inject 225 mg into the skin every 30 (thirty) days.   gabapentin (NEURONTIN) 400 MG capsule TAKE 1 CAPSULE (400 MG TOTAL) BY MOUTH 2 (TWO) TIMES DAILY. (Patient taking differently: Take 400 mg by mouth 3 (three) times daily.)   Glucosamine-Chondroitin (MOVE FREE PO) Take by mouth daily.   indomethacin (INDOCIN) 25 MG capsule Take one pill po once or twice a day prn headahce   latanoprost (XALATAN) 0.005 % ophthalmic solution Place 1 drop into both eyes at bedtime.   Misc Natural Products (JOINT HEALTH PO) Take by mouth.   montelukast (SINGULAIR) 10 MG tablet TAKE 1 TABLET BY MOUTH EVERYDAY AT BEDTIME   Multiple Vitamins-Minerals (WOMENS MULTIVITAMIN PO) Take by mouth.   OXYGEN Inhale into the lungs.   Probiotic Product (PROBIOTIC PO) Take by mouth.   QUEtiapine (  SEROQUEL XR) 400 MG 24 hr tablet Take 1 tablet (400 mg total) by mouth at bedtime. (Patient taking differently: Take 450 mg by mouth at bedtime.)   QUEtiapine (SEROQUEL XR) 50 MG TB24 24 hr tablet Take 100 mg by mouth at bedtime.   Rimegepant Sulfate (NURTEC) 75 MG TBDP Take 75 mg by mouth daily as needed.   rizatriptan (MAXALT-MLT) 10 MG disintegrating tablet Take 1 tablet  (10 mg total) by mouth as needed for migraine. May repeat in 2 hours if needed   Spacer/Aero-Holding Chambers (AEROCHAMBER PLUS) inhaler Use as instructed   temazepam (RESTORIL) 15 MG capsule Take 1 capsule (15 mg total) by mouth at bedtime.   traMADol (ULTRAM) 50 MG tablet Take 50 mg by mouth every 8 (eight) hours as needed.   triamterene-hydrochlorothiazide (MAXZIDE-25) 37.5-25 MG tablet TAKE 1 TABLET BY MOUTH EVERY DAY   UNABLE TO FIND Med Name: magnesium L theronate  650 mg at HS   No facility-administered encounter medications on file as of 04/09/2021.    Allergies (verified) Patient has no known allergies.   History: Past Medical History:  Diagnosis Date   Allergy    Anxiety    Asthma    Depression    Hyperlipidemia    Hypertension    Migraines    Osteopenia after menopause    Persistent headaches    Pneumonia 2020   Postherpetic neuralgia    Renal disorder    Shingles outbreak 02/2014   Skin cancer    moles on right groin and right buttock.   Past Surgical History:  Procedure Laterality Date   CARDIOVASCULAR STRESS TEST  02/2009   treadmill stress test: Low risk   CESAREAN SECTION  1987, 1989   COLONOSCOPY     2014/2015 [place], normal   EXPLORATORY LAPAROTOMY     laproscopy for infertility   EYE SURGERY Bilateral    laser-correct opening b/tn cornea and iris   MOLE REMOVAL  2017   x 2 moles   Family History  Problem Relation Age of Onset   Hypertension Mother    Coronary artery disease Mother    Heart failure Mother    Transient ischemic attack Mother    Hypertension Father    Liver disease Sister        liver failure   Migraines Sister    Arrhythmia Brother    Hypertension Brother    Cancer Neg Hx        colon    Breast cancer Neg Hx    Social History   Socioeconomic History   Marital status: Legally Separated    Spouse name: Not on file   Number of children: 2   Years of education: Not on file   Highest education level: Not on file   Occupational History   Occupation: Control and instrumentation engineer   Occupation: retired  Tobacco Use   Smoking status: Never   Smokeless tobacco: Never  Scientific laboratory technician Use: Never used  Substance and Sexual Activity   Alcohol use: No    Alcohol/week: 0.0 standard drinks   Drug use: No   Sexual activity: Not Currently  Other Topics Concern   Not on file  Social History Narrative   Regular exercise- yes, spins daily   Diet: fruits and veggies, water    No caffeine   Lives alone    Social Determinants of Health   Financial Resource Strain: Low Risk    Difficulty of Paying Living Expenses: Not hard at all  Food Insecurity: No Food Insecurity   Worried About Charity fundraiser in the Last Year: Never true   Ran Out of Food in the Last Year: Never true  Transportation Needs: No Transportation Needs   Lack of Transportation (Medical): No   Lack of Transportation (Non-Medical): No  Physical Activity: Inactive   Days of Exercise per Week: 0 days   Minutes of Exercise per Session: 0 min  Stress: No Stress Concern Present   Feeling of Stress : Not at all  Social Connections: Moderately Isolated   Frequency of Communication with Friends and Family: Twice a week   Frequency of Social Gatherings with Friends and Family: Twice a week   Attends Religious Services: More than 4 times per year   Active Member of [employer] or Organizations: No   Attends Music therapist: Never   Marital Status: Separated    Tobacco Counseling Counseling given: Not Answered   Clinical Intake:  Pre-visit preparation completed: Yes  Pain : No/denies pain     Nutritional Risks: None Diabetes: No  How often do you need to have someone help you when you read instructions, pamphlets, or other written materials from your doctor or pharmacy?: 1 - Never What is the last grade level you completed in school?: [place]   Interpreter Needed?: No  Information entered by ::  L.Nechelle Petrizzo,LPN   Activities of Daily Living In your present state of health, do you have any difficulty performing the following activities: 04/09/2021 09/16/2020  Hearing? N N  Vision? N N  Difficulty concentrating or making decisions? N N  Walking or climbing stairs? N N  Dressing or bathing? N N  Doing errands, shopping? N N  Preparing Food and eating ? N -  Using the Toilet? N -  In the past six months, have you accidently leaked urine? N -  Do you have problems with loss of bowel control? N -  Managing your Medications? N -  Managing your Finances? N -  Housekeeping or managing your Housekeeping? N -  Some recent data might be hidden    Patient Care Team: [name], MD as PCP - General (Family Medicine) Dalia Heading, CNM (Inactive) as Midwife (Certified Nurse Midwife) Bary Castilla, Forest Gleason, MD (General Surgery) Ulis Rias, MD (Psychiatry) Penni Bombard, MD as Consulting Physician (Neurology) Sable Feil, MD as Consulting Physician (Gastroenterology) Rushie Chestnut (Psychiatry) Mickle Plumb [facility]) Dermatology, [facility] Skin & (Dermatology) Gerda Diss, DO as Consulting Physician (Family Medicine) Debbora Presto, NP as Nurse Practitioner (Neurology) Flora Lipps, MD as Consulting Physician (Pulmonary Disease) Madelin Rear, [facility] as Pharmacist (Pharmacist)  Indicate any recent Medical Services you may have received from other than [facility] providers in the past year (date may be approximate).     Assessment:   This is a routine wellness examination for Korinna.  Hearing/Vision screen Vision Screening - Comments:: Annual eye exams wear glasses   Dietary issues and exercise activities discussed: Current Exercise Habits: The patient does not participate in regular exercise at present, Exercise limited by: None identified   Goals Addressed   None    Depression Screen PHQ 2/9 Scores 04/09/2021 04/09/2021 02/25/2021 09/16/2020  03/24/2020 03/03/2020 02/19/2020  PHQ - 2 Score 0 0 1 0 0 0 0  PHQ- 9 Score - - 1 0 - 0 0    Fall Risk Fall Risk  04/09/2021 09/16/2020 03/24/2020 03/03/2020 02/19/2020  Falls in the past year? 0 0 0 0  0  Number falls in past yr: 0 0 0 0 0  Injury with Fall? 0 0 0 0 0  Risk for fall due to : - No Fall Risks - No Fall Risks No Fall Risks  Follow up Falls evaluation completed - Falls prevention discussed - -    FALL RISK PREVENTION PERTAINING TO THE HOME:  Any stairs in or around the home? No  If so, are there any without handrails? No  Home free of loose throw rugs in walkways, pet beds, electrical cords, etc? Yes  Adequate lighting in your home to reduce risk of falls? Yes   ASSISTIVE DEVICES UTILIZED TO PREVENT FALLS:  Life alert? No  Use of a cane, walker or w/c? No  Grab bars in the bathroom? No  Shower chair or bench in shower? Yes  Elevated toilet seat or a handicapped toilet? No    Cognitive Function: Normal cognitive status assessed by direct observation by this Nurse Health Advisor. No abnormalities found.   MMSE - Mini Mental State Exam 06/03/2016  Orientation to time 5  Orientation to Place 5  Registration 3  Attention/ Calculation 5  Recall 3  Language- name 2 objects 2  Language- repeat 1  Language- follow 3 step command 3  Language- read & follow direction 1  Write a sentence 1  Copy design 1  Total score 30        Immunizations Immunization History  Administered Date(s) Administered   Fluad Quad(high Dose 65+) 03/04/2020   Influenza,inj,Quad PF,6+ Mos 01/19/2017, 01/26/2019   Moderna Sars-Covid-2 Vaccination 09/03/2019, 09/24/2019   Pneumococcal Conjugate-13 01/17/2020   Pneumococcal Polysaccharide-23 12/25/2018   Tdap 07/27/2012   Zoster Recombinat (Shingrix) 05/04/2018, 10/26/2018   Zoster, Live 09/26/2014    TDAP status: Up to date  Flu Vaccine status: Up to date  Pneumococcal vaccine status: Up to date  Covid-19 vaccine status: Completed  vaccines  Qualifies for Shingles Vaccine? Yes   Zostavax completed Yes   Shingrix Completed?: Yes  Screening Tests Health Maintenance  Topic Date Due   COVID-19 Vaccine (3 - Mixed Product risk series) 10/22/2019   COLONOSCOPY (Pts 45-63yrs Insurance coverage will need to be confirmed)  01/22/2020   MAMMOGRAM  05/20/2021   TETANUS/TDAP  07/28/2022   Pneumonia Vaccine 4+ Years old (3 - PPSV23 if available, else PCV20) 12/25/2023   INFLUENZA VACCINE  Completed   DEXA SCAN  Completed   Hepatitis C Screening  Completed   Zoster Vaccines- Shingrix  Completed   HPV VACCINES  Aged Out    Health Maintenance  Health Maintenance Due  Topic Date Due   COVID-19 Vaccine (3 - Mixed Product risk series) 10/22/2019   COLONOSCOPY (Pts 45-32yrs Insurance coverage will need to be confirmed)  01/22/2020    Colorectal cancer screening: Referral to GI placed 04/09/2021. Pt aware the office will call re: appt.  Mammogram status: Ordered scheduled 05/20/2021. Pt provided with contact info and advised to call to schedule appt.   Bone Density status: Completed 04/25/2020. Results reflect: Bone density results: OSTEOPENIA. Repeat every 5 years.  Lung Cancer Screening: (Low Dose CT Chest recommended if Age 5-80 years, 30 pack-year currently smoking OR have quit w/in 15years.) does not qualify.   Lung Cancer Screening Referral: n/a  Additional Screening:  Hepatitis C Screening: does not qualify; Completed 12/03/2015  Vision Screening: Recommended annual ophthalmology exams for early detection of glaucoma and other disorders of the eye. Is the patient up to date with their annual eye exam?  Yes  Who is the provider or what is the name of the office in which the patient attends annual eye exams? Dr.Weslocki If pt is not established with a provider, would they like to be referred to a provider to establish care? No .   Dental Screening: Recommended annual dental exams for proper oral  hygiene  Community Resource Referral / Chronic Care Management: CRR required this visit?  No   CCM required this visit?  No      Plan:     I have personally reviewed and noted the following in the patients chart:   Medical and social history Use of alcohol, tobacco or illicit drugs  Current medications and supplements including opioid prescriptions.  Functional ability and status Nutritional status Physical activity Advanced directives List of other physicians Hospitalizations, surgeries, and ER visits in previous 12 months Vitals Screenings to include cognitive, depression, and falls Referrals and appointments  In addition, I have reviewed and discussed with patient certain preventive protocols, quality metrics, and best practice recommendations. A written personalized care plan for preventive services as well as general preventive health recommendations were provided to patient.     Randel Pigg, LPN   [id number]   Nurse Notes: none

## 2021-04-09 NOTE — Patient Instructions (Signed)
Theresa Martin , Thank you for taking time to come for your Medicare Wellness Visit. I appreciate your ongoing commitment to your health goals. Please review the following plan we discussed and let me know if I can assist you in the future.   Screening recommendations/referrals: Colonoscopy: referral 04/09/2021 Mammogram: scheduled 05/20/2021 Bone Density: 04/25/2020 Recommended yearly ophthalmology/optometry visit for glaucoma screening and checkup Recommended yearly dental visit for hygiene and checkup  Vaccinations: Influenza vaccine: completed  Pneumococcal vaccine: completed  Tdap vaccine: 07/27/2012 Shingles vaccine: completed     Advanced directives: yes   Conditions/risks identified: none   Next appointment: none    Preventive Care 40 Years and Older, Female Preventive care refers to lifestyle choices and visits with your health care provider that can promote health and wellness. What does preventive care include? A yearly physical exam. This is also called an annual well check. Dental exams once or twice a year. Routine eye exams. Ask your health care provider how often you should have your eyes checked. Personal lifestyle choices, including: Daily care of your teeth and gums. Regular physical activity. Eating a healthy diet. Avoiding tobacco and drug use. Limiting alcohol use. Practicing safe sex. Taking low-dose aspirin every day. Taking vitamin and mineral supplements as recommended by your health care provider. What happens during an annual well check? The services and screenings done by your health care provider during your annual well check will depend on your age, overall health, lifestyle risk factors, and family history of disease. Counseling  Your health care provider may ask you questions about your: Alcohol use. Tobacco use. Drug use. Emotional well-being. Home and relationship well-being. Sexual activity. Eating habits. History of falls. Memory  and ability to understand (cognition). Work and work Statistician. Reproductive health. Screening  You may have the following tests or measurements: Height, weight, and BMI. Blood pressure. Lipid and cholesterol levels. These may be checked every 5 years, or more frequently if you are over 65 years old. Skin check. Lung cancer screening. You may have this screening every year starting at age 60 if you have a 30-pack-year history of smoking and currently smoke or have quit within the past 15 years. Fecal occult blood test (FOBT) of the stool. You may have this test every year starting at age 74. Flexible sigmoidoscopy or colonoscopy. You may have a sigmoidoscopy every 5 years or a colonoscopy every 10 years starting at age 22. Hepatitis C blood test. Hepatitis B blood test. Sexually transmitted disease (STD) testing. Diabetes screening. This is done by checking your blood sugar (glucose) after you have not eaten for a while (fasting). You may have this done every 1-3 years. Bone density scan. This is done to screen for osteoporosis. You may have this done starting at age 102. Mammogram. This may be done every 1-2 years. Talk to your health care provider about how often you should have regular mammograms. Talk with your health care provider about your test results, treatment options, and if necessary, the need for more tests. Vaccines  Your health care provider may recommend certain vaccines, such as: Influenza vaccine. This is recommended every year. Tetanus, diphtheria, and acellular pertussis (Tdap, Td) vaccine. You may need a Td booster every 10 years. Zoster vaccine. You may need this after age 43. Pneumococcal 13-valent conjugate (PCV13) vaccine. One dose is recommended after age 59. Pneumococcal polysaccharide (PPSV23) vaccine. One dose is recommended after age 85. Talk to your health care provider about which screenings and vaccines you need and how  often you need them. This  information is not intended to replace advice given to you by your health care provider. Make sure you discuss any questions you have with your health care provider. Document Released: 04/11/2015 Document Revised: 12/03/2015 Document Reviewed: 01/14/2015 Elsevier Interactive Patient Education  2017 [employer] Prevention in the Home Falls can cause injuries. They can happen to people of all ages. There are many things you can do to make your home safe and to help prevent falls. What can I do on the outside of my home? Regularly fix the edges of walkways and driveways and fix any cracks. Remove anything that might make you trip as you walk through a door, such as a raised step or threshold. Trim any bushes or trees on the path to your home. Use bright outdoor lighting. Clear any walking paths of anything that might make someone trip, such as rocks or tools. Regularly check to see if handrails are loose or broken. Make sure that both sides of any steps have handrails. Any raised decks and porches should have guardrails on the edges. Have any leaves, snow, or ice cleared regularly. Use sand or salt on walking paths during winter. Clean up any spills in your garage right away. This includes oil or grease spills. What can I do in the bathroom? Use night lights. Install grab bars by the toilet and in the tub and shower. Do not use towel bars as grab bars. Use non-skid mats or decals in the tub or shower. If you need to sit down in the shower, use a plastic, non-slip stool. Keep the floor dry. Clean up any water that spills on the floor as soon as it happens. Remove soap buildup in the tub or shower regularly. Attach bath mats securely with double-sided non-slip rug tape. Do not have throw rugs and other things on the floor that can make you trip. What can I do in the bedroom? Use night lights. Make sure that you have a light by your bed that is easy to reach. Do not use any sheets or  blankets that are too big for your bed. They should not hang down onto the floor. Have a firm chair that has side arms. You can use this for support while you get dressed. Do not have throw rugs and other things on the floor that can make you trip. What can I do in the kitchen? Clean up any spills right away. Avoid walking on wet floors. Keep items that you use a lot in easy-to-reach places. If you need to reach something above you, use a strong step stool that has a grab bar. Keep electrical cords out of the way. Do not use floor polish or wax that makes floors slippery. If you must use wax, use non-skid floor wax. Do not have throw rugs and other things on the floor that can make you trip. What can I do with my stairs? Do not leave any items on the stairs. Make sure that there are handrails on both sides of the stairs and use them. Fix handrails that are broken or loose. Make sure that handrails are as long as the stairways. Check any carpeting to make sure that it is firmly attached to the stairs. Fix any carpet that is loose or worn. Avoid having throw rugs at the top or bottom of the stairs. If you do have throw rugs, attach them to the floor with carpet tape. Make sure that you have a  light switch at the top of the stairs and the bottom of the stairs. If you do not have them, ask someone to add them for you. What else can I do to help prevent falls? Wear shoes that: Do not have high heels. Have rubber bottoms. Are comfortable and fit you well. Are closed at the toe. Do not wear sandals. If you use a stepladder: Make sure that it is fully opened. Do not climb a closed stepladder. Make sure that both sides of the stepladder are locked into place. Ask someone to hold it for you, if possible. Clearly mark and make sure that you can see: Any grab bars or handrails. First and last steps. Where the edge of each step is. Use tools that help you move around (mobility aids) if they are  needed. These include: Canes. Walkers. Scooters. Crutches. Turn on the lights when you go into a dark area. Replace any light bulbs as soon as they burn out. Set up your furniture so you have a clear path. Avoid moving your furniture around. If any of your floors are uneven, fix them. If there are any pets around you, be aware of where they are. Review your medicines with your doctor. Some medicines can make you feel dizzy. This can increase your chance of falling. Ask your doctor what other things that you can do to help prevent falls. This information is not intended to replace advice given to you by your health care provider. Make sure you discuss any questions you have with your health care provider. Document Released: 01/09/2009 Document Revised: 08/21/2015 Document Reviewed: 04/19/2014 Elsevier Interactive Patient Education  2017 [employer].

## 2021-04-13 ENCOUNTER — Telehealth: Payer: Self-pay

## 2021-04-13 NOTE — Telephone Encounter (Signed)
Spoke with patient she will call us back when she is ready to schedule

## 2021-04-15 DIAGNOSIS — S66304A Unspecified injury of extensor muscle, fascia and tendon of right ring finger at wrist and hand level, initial encounter: Secondary | ICD-10-CM | POA: Diagnosis not present

## 2021-04-15 DIAGNOSIS — M65341 Trigger finger, right ring finger: Secondary | ICD-10-CM | POA: Diagnosis not present

## 2021-04-15 DIAGNOSIS — M79641 Pain in right hand: Secondary | ICD-10-CM | POA: Diagnosis not present

## 2021-04-15 DIAGNOSIS — S66394A Other injury of extensor muscle, fascia and tendon of right ring finger at wrist and hand level, initial encounter: Secondary | ICD-10-CM | POA: Diagnosis not present

## 2021-04-20 DIAGNOSIS — F3181 Bipolar II disorder: Secondary | ICD-10-CM | POA: Diagnosis not present

## 2021-04-20 DIAGNOSIS — F3132 Bipolar disorder, current episode depressed, moderate: Secondary | ICD-10-CM | POA: Diagnosis not present

## 2021-04-20 DIAGNOSIS — F411 Generalized anxiety disorder: Secondary | ICD-10-CM | POA: Diagnosis not present

## 2021-04-21 ENCOUNTER — Telehealth: Payer: Self-pay | Admitting: Internal Medicine

## 2021-04-21 ENCOUNTER — Encounter: Payer: Self-pay | Admitting: Family Medicine

## 2021-04-21 NOTE — Telephone Encounter (Signed)
Called and spoke to patient in regards to patients AZ&ME paperwork. Let patient know they should get in contact with her when complete. Nothing further needed.

## 2021-04-21 NOTE — Telephone Encounter (Signed)
Standard time frame is 2 years I do not see pt previous scan to report pt specific follow up recommendation please advise

## 2021-04-28 ENCOUNTER — Other Ambulatory Visit: Payer: Self-pay | Admitting: Sports Medicine

## 2021-04-28 ENCOUNTER — Ambulatory Visit
Admission: RE | Admit: 2021-04-28 | Discharge: 2021-04-28 | Disposition: A | Discharge status: Home / Self Care | Payer: Medicare Other | Source: Ambulatory Visit | Attending: Sports Medicine | Admitting: Sports Medicine

## 2021-04-28 ENCOUNTER — Other Ambulatory Visit: Payer: Self-pay

## 2021-04-28 DIAGNOSIS — M542 Cervicalgia: Secondary | ICD-10-CM | POA: Diagnosis not present

## 2021-04-28 DIAGNOSIS — M9908 Segmental and somatic dysfunction of rib cage: Secondary | ICD-10-CM | POA: Diagnosis not present

## 2021-04-28 DIAGNOSIS — M9902 Segmental and somatic dysfunction of thoracic region: Secondary | ICD-10-CM | POA: Diagnosis not present

## 2021-04-28 DIAGNOSIS — M9906 Segmental and somatic dysfunction of lower extremity: Secondary | ICD-10-CM | POA: Diagnosis not present

## 2021-04-28 DIAGNOSIS — G894 Chronic pain syndrome: Secondary | ICD-10-CM | POA: Diagnosis not present

## 2021-04-28 DIAGNOSIS — M25521 Pain in right elbow: Secondary | ICD-10-CM

## 2021-04-28 DIAGNOSIS — M9901 Segmental and somatic dysfunction of cervical region: Secondary | ICD-10-CM | POA: Diagnosis not present

## 2021-04-28 DIAGNOSIS — M9907 Segmental and somatic dysfunction of upper extremity: Secondary | ICD-10-CM | POA: Diagnosis not present

## 2021-04-29 ENCOUNTER — Encounter: Payer: Self-pay | Admitting: Diagnostic Neuroimaging

## 2021-04-29 ENCOUNTER — Other Ambulatory Visit: Payer: Self-pay | Admitting: Family Medicine

## 2021-04-29 DIAGNOSIS — Z20828 Contact with and (suspected) exposure to other viral communicable diseases: Secondary | ICD-10-CM | POA: Diagnosis not present

## 2021-04-29 DIAGNOSIS — Z1152 Encounter for screening for COVID-19: Secondary | ICD-10-CM | POA: Diagnosis not present

## 2021-05-06 DIAGNOSIS — F3132 Bipolar disorder, current episode depressed, moderate: Secondary | ICD-10-CM | POA: Diagnosis not present

## 2021-05-06 DIAGNOSIS — F411 Generalized anxiety disorder: Secondary | ICD-10-CM | POA: Diagnosis not present

## 2021-05-13 ENCOUNTER — Ambulatory Visit: Payer: Medicare Other | Admitting: Adult Health

## 2021-05-15 ENCOUNTER — Encounter: Payer: Self-pay | Admitting: Family Medicine

## 2021-05-15 DIAGNOSIS — F3181 Bipolar II disorder: Secondary | ICD-10-CM | POA: Diagnosis not present

## 2021-05-15 DIAGNOSIS — F3132 Bipolar disorder, current episode depressed, moderate: Secondary | ICD-10-CM | POA: Diagnosis not present

## 2021-05-15 DIAGNOSIS — F411 Generalized anxiety disorder: Secondary | ICD-10-CM | POA: Diagnosis not present

## 2021-05-19 ENCOUNTER — Other Ambulatory Visit: Payer: Self-pay | Admitting: Diagnostic Neuroimaging

## 2021-05-19 ENCOUNTER — Telehealth: Payer: Self-pay | Admitting: *Deleted

## 2021-05-19 ENCOUNTER — Encounter: Payer: Self-pay | Admitting: Diagnostic Neuroimaging

## 2021-05-19 NOTE — Telephone Encounter (Signed)
Ajovy PA, key [place], [phone]. Your information has been submitted to [place].  If [employer] has not responded to your request within 24 hours, contact [place] at [phone].

## 2021-05-19 NOTE — Telephone Encounter (Signed)
Ajovy approved 05/19/21-05/19/2022. Sent my chart to advise patient.

## 2021-05-21 ENCOUNTER — Encounter: Payer: Self-pay | Admitting: Diagnostic Neuroimaging

## 2021-05-21 ENCOUNTER — Ambulatory Visit
Admission: EM | Admit: 2021-05-21 | Discharge: 2021-05-21 | Disposition: A | Discharge status: Home / Self Care | Payer: Medicare Other | Attending: Internal Medicine | Admitting: Internal Medicine

## 2021-05-21 ENCOUNTER — Other Ambulatory Visit: Payer: Self-pay

## 2021-05-21 ENCOUNTER — Institutional Professional Consult (permissible substitution): Payer: Medicare Other | Admitting: Neurology

## 2021-05-21 ENCOUNTER — Encounter: Payer: Self-pay | Admitting: Emergency Medicine

## 2021-05-21 DIAGNOSIS — L0390 Cellulitis, unspecified: Secondary | ICD-10-CM

## 2021-05-21 DIAGNOSIS — Z9189 Other specified personal risk factors, not elsewhere classified: Secondary | ICD-10-CM

## 2021-05-21 MED ORDER — METHYLPREDNISOLONE 4 MG PO TBPK: ORAL_TABLET | ORAL | 0 refills | Status: DC

## 2021-05-21 MED ORDER — CEPHALEXIN 500 MG PO CAPS: ORAL_CAPSULE | ORAL | 0 refills | Status: DC

## 2021-05-21 NOTE — ED Triage Notes (Signed)
Pt here after injecting her regular Ajovy medication yesterday into her right lateral thigh. Pt reports pain, redness, warmth that has spread.

## 2021-05-21 NOTE — Discharge Instructions (Addendum)
Please tell your Neurologist and show him/her pictures since you may gets worse with the future injections.

## 2021-05-21 NOTE — ED Provider Notes (Signed)
[name]    CSN: [id number] Arrival date & time: 05/21/21  1715      History   Chief Complaint Chief Complaint  Patient presents with   Rash   Injection Site Reaction    HPI Theresa Martin is a 66 y.o. female who presents with large red, hot and itchy swelling on the area where she injected Ajovy for migraines. Has been using this injection x 7 months.  She states she gets a small red area after each injection and this am 1h after was much smaller and has gotten 10 times larger, hard and hot since. This area is not painful, but is itchy.  Also this am when she woke up felt some tightness in her chest like her asthma causes, so she used her inhaler and felt better. Has not taken any antihistamines.  She admits of using sterile technique when she gave herself the injection Past Medical History:  Diagnosis Date   Allergy    Anxiety    Asthma    Depression    Hyperlipidemia    Hypertension    Migraines    Osteopenia after menopause    Persistent headaches    Pneumonia 2020   Postherpetic neuralgia    Renal disorder    Shingles outbreak 02/2014   Skin cancer    moles on right groin and right buttock.    Patient Active Problem List   Diagnosis Date Noted   GERD (gastroesophageal reflux disease) 02/28/2019   Schizo affective schizophrenia ([place]) 02/01/2019   Abnormal chest CT 12/11/2018   Pneumonia 09/29/2018   Nonallopathic lesion of thoracic region 09/05/2018   Nonallopathic lesion of rib cage 09/05/2018   Nonallopathic lesion of cervical region 09/05/2018   Nonallopathic lesion of lumbosacral region 09/05/2018   Nonallopathic lesion of sacral region 09/05/2018   Skin cancer    Chronic right shoulder pain 03/30/2017   Right shoulder pain 01/12/2017   Tendinopathy of rotator cuff 12/20/2016   Angle-closure glaucoma, severe stage 08/19/2015   Osteopenia 06/19/2015   Migraine without aura and without status migrainosus, not intractable 01/13/2015    High risk medications (not anticoagulants) long-term use 09/26/2014   Routine general medical examination at a health care facility 07/27/2012   Hyperlipidemia 09/29/2010   URETEROPELVIC JUNCTION OBSTRUCTION, CONGENITAL 12/15/2009   CARDIAC MURMUR 11/26/2008   MAGNETIC RESONANCE IMAGING, BRAIN, ABNORMAL 10/29/2008   VENEREAL WART 10/23/2008   Major depressive disorder, recurrent episode ([place]) 10/23/2008   Essential hypertension 10/23/2008   Allergic rhinitis 10/23/2008   Asthma 10/23/2008   RENAL CALCULUS, HX OF 10/23/2008    Past Surgical History:  Procedure Laterality Date   CARDIOVASCULAR STRESS TEST  02/2009   treadmill stress test: Low risk   [place]     2014/2015 , normal   EXPLORATORY LAPAROTOMY     laproscopy for infertility   EYE SURGERY Bilateral    laser-correct opening b/tn cornea and iris   MOLE REMOVAL  2017   x 2 moles    OB History     Gravida  3   Para  2   Term  2   Preterm      AB  1   Living  2      SAB  1   IAB      Ectopic      Multiple      Live Births  2        Obstetric Comments  1st Menstrual Cycle:  11 1st Pregnancy:  29           Home Medications    Prior to Admission medications   Medication Sig Start Date End Date Taking? Authorizing Provider  cephALEXin (KEFLEX) 500 MG capsule 2 bid x 7 days 05/21/21  Yes Rodriguez-Southworth, Sunday Spillers, PA-C  methylPREDNISolone (MEDROL DOSEPAK) 4 MG TBPK tablet Take as directed 05/21/21  Yes Rodriguez-Southworth, Sunday Spillers, PA-C  albuterol (VENTOLIN HFA) 108 (90 Base) MCG/ACT inhaler TAKE 2 PUFFS BY MOUTH EVERY 6 HOURS AS NEEDED FOR WHEEZE OR SHORTNESS OF BREATH 10/22/19   [name], MD  atorvastatin (LIPITOR) 20 MG tablet TAKE 1 TABLET BY MOUTH EVERY DAY 03/23/21   [name], MD  budesonide-formoterol [facility]) 160-4.5 MCG/ACT inhaler Inhale 2 puffs into the lungs 2 (two) times daily. in the morning and at bedtime.  12/18/20   Parrett, Fonnie Mu, NP  CALCIUM PO Take 1,300 mg by mouth daily.    [provider]  Cholecalciferol (VITAMIN D3 PO) Take 5,000 Units by mouth.    [provider]  clonazePAM (KLONOPIN) 0.5 MG tablet Take 0.5 mg by mouth daily as needed. 09/06/19   [provider]  ELDERBERRY PO Take by mouth.    [provider]  escitalopram (LEXAPRO) 10 MG tablet Take 10 mg by mouth daily. 07/03/19   [provider]  Fremanezumab-vfrm (AJOVY) 225 MG/1.5ML SOSY Inject 225 mg into the skin every 30 (thirty) days. 01/29/21   Penumalli, Earlean Polka, MD  gabapentin (NEURONTIN) 400 MG capsule TAKE 1 CAPSULE (400 MG TOTAL) BY MOUTH 2 (TWO) TIMES DAILY. Patient taking differently: Take 400 mg by mouth 3 (three) times daily. 09/01/20   Penumalli, Earlean Polka, MD  Glucosamine-Chondroitin (MOVE FREE PO) Take by mouth daily.    [provider]  indomethacin (INDOCIN) 25 MG capsule Take one pill po once or twice a day prn headahce 11/14/20   Sater, Nanine Means, MD  latanoprost (XALATAN) 0.005 % ophthalmic solution Place 1 drop into both eyes at bedtime. 09/05/19   [name], MD  Misc Natural Products (JOINT HEALTH PO) Take by mouth.    [provider]  montelukast (SINGULAIR) 10 MG tablet TAKE 1 TABLET BY MOUTH EVERYDAY AT BEDTIME 04/30/21   [name], MD  Multiple Vitamins-Minerals (WOMENS MULTIVITAMIN PO) Take by mouth.    [provider]  OXYGEN Inhale into the lungs.    [provider]  Probiotic Product (PROBIOTIC PO) Take by mouth.    [provider]  QUEtiapine (SEROQUEL XR) 400 MG 24 hr tablet Take 1 tablet (400 mg total) by mouth at bedtime. Patient taking differently: Take 450 mg by mouth at bedtime. 02/15/19   Money, Lowry Ram, FNP  QUEtiapine (SEROQUEL XR) 50 MG TB24 24 hr tablet Take 100 mg by mouth at bedtime. 02/21/21   [provider]  Rimegepant Sulfate (NURTEC) 75 MG TBDP Take 75 mg by mouth daily as  needed. 11/17/20   Penumalli, Earlean Polka, MD  rizatriptan (MAXALT-MLT) 10 MG disintegrating tablet Take 1 tablet (10 mg total) by mouth as needed for migraine. May repeat in 2 hours if needed 11/17/20   Penumalli, Earlean Polka, MD  Spacer/Aero-Holding Chambers (AEROCHAMBER PLUS) inhaler Use as instructed 01/13/21   Flora Lipps, MD  temazepam (RESTORIL) 15 MG capsule Take 1 capsule (15 mg total) by mouth at bedtime. 02/15/19   Money, Lowry Ram, FNP  traMADol (ULTRAM) 50 MG tablet Take 50 mg by mouth every  8 (eight) hours as needed. 10/15/20   [provider]  triamterene-hydrochlorothiazide (MAXZIDE-25) 37.5-25 MG tablet TAKE 1 TABLET BY MOUTH EVERY DAY 03/19/21   [name], MD  UNABLE TO FIND Med Name: magnesium L theronate  650 mg at [facility]    [provider]    Family History Family History  Problem Relation Age of Onset   Hypertension Mother    Coronary artery disease Mother    Heart failure Mother    Transient ischemic attack Mother    Hypertension Father    Liver disease Sister        liver failure   Migraines Sister    Arrhythmia Brother    Hypertension Brother    Cancer Neg Hx        colon    Breast cancer Neg Hx     Social History Social History   Tobacco Use   Smoking status: Never   Smokeless tobacco: Never  Vaping Use   Vaping Use: Never used  Substance Use Topics   Alcohol use: No    Alcohol/week: 0.0 standard drinks   Drug use: No     Allergies   Patient has no known allergies.   Review of Systems Review of Systems  HENT:  Negative for trouble swallowing.   Respiratory:  Negative for shortness of breath.   Musculoskeletal:  Positive for neck pain.  Skin:  Positive for color change and rash.    Physical Exam Triage Vital Signs ED Triage Vitals  Enc Vitals Group     BP 05/21/21 1748 129/72     Pulse Rate 05/21/21 1748 68     Resp 05/21/21 1748 20     Temp 05/21/21 1748 99.1 F (37.3 C)     Temp src --      SpO2 05/21/21 1748 98  %     Weight --      Height --      Head Circumference --      Peak Flow --      Pain Score 05/21/21 1750 5     Pain Loc --      Pain Edu? --      Excl. in [place]? --    No data found.  Updated Vital Signs BP 129/72    Pulse 68    Temp 99.1 F (37.3 C)    Resp 20    SpO2 98%   Visual Acuity Right Eye Distance:   Left Eye Distance:   Bilateral Distance:    Right Eye Near:   Left Eye Near:    Bilateral Near:     Physical Exam Vitals and nursing note reviewed.  Constitutional:      General: She is not in acute distress.    Appearance: She is not toxic-appearing.  HENT:     Right Ear: External ear normal.     Left Ear: External ear normal.  Eyes:     General: No scleral icterus.    Conjunctiva/sclera: Conjunctivae normal.  Pulmonary:     Effort: Pulmonary effort is normal.  Skin:    General: Skin is warm and dry.     Findings: No rash.     Comments: R lateral thigh with a very large oval, hot erythematous and hard swelling area which looks like a gigantic hive. This is the area where she injected herself. The skin looked like orange peel. No streaking from there noted.   Neurological:     Mental Status: She is  alert and oriented to person, place, and time.     Gait: Gait normal.  Psychiatric:        Mood and Affect: Mood normal.        Behavior: Behavior normal.        Thought Content: Thought content normal.        Judgment: Judgment normal.     UC Treatments / Results  Labs (all labs ordered are listed, but only abnormal results are displayed) Labs Reviewed - No data to display  EKG   Radiology No results found.  Procedures Procedures (including critical care time)  Medications Ordered in UC Medications - No data to display  Initial Impression / Assessment and Plan / UC Course  I have reviewed the triage vital signs and the nursing notes. Local reaction to injection medication and possibly cellulitis.  I placed her on Medrol and keflex as noted.   Needs to discuss this reaction with her Neurologist with whom she has an appt next week.  Final Clinical Impressions(s) / UC Diagnoses   Final diagnoses:  Cellulitis, unspecified cellulitis site  At risk for allergic reaction to medication     Discharge Instructions      Please tell your Neurologist and show him/her pictures since you may gets worse with the future injections.    ED Prescriptions     Medication Sig Dispense Auth. Provider   cephALEXin (KEFLEX) 500 MG capsule 2 bid x 7 days 28 capsule Rodriguez-Southworth, Dennis Hegeman, PA-C   methylPREDNISolone (MEDROL DOSEPAK) 4 MG TBPK tablet Take as directed 21 tablet Rodriguez-Southworth, Sunday Spillers, PA-C      PDMP not reviewed this encounter.   Shelby Mattocks, [place] 05/21/21 1903

## 2021-05-25 ENCOUNTER — Ambulatory Visit (INDEPENDENT_AMBULATORY_CARE_PROVIDER_SITE_OTHER): Payer: Medicare Other | Admitting: Diagnostic Neuroimaging

## 2021-05-25 ENCOUNTER — Encounter: Payer: Self-pay | Admitting: Diagnostic Neuroimaging

## 2021-05-25 VITALS — BP 122/73 | HR 76 | Ht 61.0 in | Wt 114.2 lb

## 2021-05-25 DIAGNOSIS — G43109 Migraine with aura, not intractable, without status migrainosus: Secondary | ICD-10-CM | POA: Diagnosis not present

## 2021-05-25 NOTE — Progress Notes (Signed)
[facility]  PATIENT: Theresa Martin DOB: 02-22-1956  REFERRING CLINICIAN:  HISTORY FROM: patient REASON FOR VISIT: follow up   HISTORICAL  CHIEF COMPLAINT:  Chief Complaint  Patient presents with   Migraine    Rm 6, 6 month FU "went to Urgent Care for Ajovy reaction, put on steroid pack/antibiotic; Ajovy is working so well"     HISTORY OF PRESENT ILLNESS:   UPDATE (05/25/21, VRP): Since last visit, doing well, except recent injection site reaction; used same ajovy needle 3 times (05/20/21), b/c of retained medication in needle. Then had red and swelling around site. Now on steroid and abx, and sxs getting better. Never had this issue before.  UPDATE (11/17/20, VRP): Since last visit, doing well until 3 weeks ago; returned from vacation with family (camper Lucianne Lei; fun but stressful). Then almost daily left sided throbbing HA, left side numbness. Tried gabapentin increase, prednisone, indomethacin; no relief. Phonophobia. No nausea.   UPDATE (11/06/20, MM): 66 year old female with a history of migraine headaches.  She returns today for follow-up.  She reports that she has had an ongoing headache for 2 weeks.  Reports that it starts on the left side behind the eye.  She reports that she has a tingling sensation that radiates down the face and down the arm into the leg and toes.  She has been taking an over-the-counter medication Tylenol and Advil on a daily basis.  Her sports medicine doctor saw her in the last 2 weeks and did an injection.  She is not sure what the injection was.  His notes is not in epic.  She also reports that she was given a prescription for tramadol.  She states that several years ago she had a severe headache with tingling sensations down the left side of the body and went to the emergency room.  Reports that she had a complete stroke work-up that was negative.  Patient had MRI of the brain with and without contrast in June 2021 that was unremarkable.   She remains on gabapentin 400 mg twice a day.  Reports that this has been controlling her headaches up until the last 2 weeks.  UPDATE (08/08/19, VRP): Since last visit, doing well, except past 3 months, has left eye pulling sensation, left eye pain, then left head pain. Some sensations in right eye. Also with mild glaucoma.   UPDATE (03/21/19, VRP): Since last visit, doing well until COVID pandemic. She weaned herself off her psych meds, then mania, psychosis . Then headaches worsened. Admitted to behavioral health. Now psych issues are better, and headache resolved since last month.   UPDATE (01/30/18, VRP): Since last visit, doing worse with HA (14-15 per month). More stress levels than last visit. Burning sensation in scalp continues.  UPDATE (08/24/17, VRP): Since last visit, doing well. Tolerating propranolol. Avg 4-13 migraine per month. No alleviating or aggravating factors.   Separately, had right thoracic burning pain (no rash) in fall 2018, and was tx'd empirically with acyclovir. Then with right scalp burning pain last week; also with chronic left scalp sensitivity with migraines.  UPDATE 08/24/16: Since last visit, avg 2-3 HA per month. Tolerating meds. No new issues. Mood stable. Weight improved.   UPDATE 02/23/16: Since last visit has ~ 1-5 HA per month. Tolerating propranolol + OTC tylenol/aleve for HA mgmt.   UPDATE 08/19/15: Since last visit, avg 1-10 HA per month. Usually with stress. Some more wt gain noted (~10lbs). Also had a a laser eye procedure for  angle closure glaucoma, now improved.  UPDATE 04/15/15: Since last visit, doing well. No more migraine since 02/28/15. Propranolol is helping prevent HA. Mood stable.  UPDATE 01/13/15: Since last visit, having more headaches, and more fluid retention. Now being tapered off gabapentin by psychiatry due to side effects.   UPDATE 09/02/14: Doing well. Avg 3-6 days HA per month. Some HA are mild, some are severe. Taking gabapentin 200mg   TID. Works out every day at [employer]. Overall mood is better as well.  UPDATE 05/17/14: Since last visit, patient had some psychiatry issues, was managed by behavioral health, and her headaches significant improved. She was doing well for several years and did not follow-up in our clinic. Patient here with her father today for this visit. Patient's father notes that her headaches seem to be significantly associated with her stress levels. In the last few months her stress levels have increased significantly. Her stress levels related to her ex-husband and her daughters, and some family issues. Patient having intermittent, almost daily left-sided headaches, with numbness and tingling in the left scalp, typically in the evening when she is home. During the daytime patient stays active, works out several times a week, and does better. Patient had been on gabapentin 100 mg at bedtime for several years. Her psychiatrist increased this to 2 and then 3 capsules at bedtime. When she did 3 capsules at bedtime, her headache type symptoms paradoxical he worsened. She then went back to taking one capsule at bedtime. Strangely, she reports that she was told she was able to take this medication (gabapentin) as needed as well and for the last few weeks has been taking one capsule at bedtime, followed by 1-2 capsules every hour, throughout the night, taking up to 10-14 capsules in a 12 hour period.   PRIOR HPI (11/04/08 - 06/03/09, VRP): 66 year old right-handed female with history of high blood pressure, depression and anxiety presenting for evaluation of chronic headaches and abnormal MRI scan.  On October 26, 2008 the patient presented to the emergency room for right-sided chest pain and was diagnosed with pneumonia.   At this time her headaches worsened in severity.  Upon review of prior MRIs demonstrating microvascular gliosis, patient was referred to our neurology clinic for futher evaluation. Patient's headaches began in 2005  consisting of a "dull pressure like sensation" over the top of her head. Occasionally these are associated with mild nausea without vomiting as well as intermittent left facial numbness.  Patient had dull nagging low level headaches on a daily basis with daily flareups involving a hot sensation over her scalp.  Sometimes this sensation starts as posterior neck pain that moves up the back of her head. She was initially evaluated with MRI scans in 2005 and 2006 and started on topiramate 100 mg at bedtime.   In addition she was taking Advil and Tylenol over-the-counter almost a daily basis.   During flareups the patient would have to lay down in place ice packs over her eyes and the top of her head.   Patient's headaches are aggravated by lack of sleep or stress. No food triggers noted.  Her other physicians decided to taper her off of NSAIDs, tylenol and also her topiramate. Patient had MRI of the head and neck, found to have some nonspecific white matter lesions. Patient had lumbar puncture and additional blood testing, but multiple sclerosis was ruled out. Patient was treated with several occipital nerve blocks with good results.   REVIEW OF SYSTEMS: Full 14 system  review of systems performed and negative except for: as per HPI.   ALLERGIES: No Known Allergies  HOME MEDICATIONS: Outpatient Medications Prior to Visit  Medication Sig Dispense Refill   albuterol (VENTOLIN HFA) 108 (90 Base) MCG/ACT inhaler TAKE 2 PUFFS BY MOUTH EVERY 6 HOURS AS NEEDED FOR WHEEZE OR SHORTNESS OF BREATH 18 g 2   atorvastatin (LIPITOR) 20 MG tablet TAKE 1 TABLET BY MOUTH EVERY DAY 90 tablet 1   budesonide-formoterol (SYMBICORT) 160-4.5 MCG/ACT inhaler Inhale 2 puffs into the lungs 2 (two) times daily. in the morning and at bedtime. 10.2 each 5   CALCIUM PO Take 1,300 mg by mouth daily.     cephALEXin (KEFLEX) 500 MG capsule 2 bid x 7 days 28 capsule 0   Cholecalciferol (VITAMIN D3 PO) Take 5,000 Units by mouth.      clonazePAM (KLONOPIN) 0.5 MG tablet Take 0.5 mg by mouth daily as needed.     ELDERBERRY PO Take by mouth.     escitalopram (LEXAPRO) 10 MG tablet Take 10 mg by mouth daily.     Fremanezumab-vfrm (AJOVY) 225 MG/1.5ML SOSY Inject 225 mg into the skin every 30 (thirty) days. 4.5 mL 4   Glucosamine-Chondroitin (MOVE FREE PO) Take by mouth daily.     latanoprost (XALATAN) 0.005 % ophthalmic solution Place 1 drop into both eyes at bedtime. 2.5 mL 12   methylPREDNISolone (MEDROL DOSEPAK) 4 MG TBPK tablet Take as directed 21 tablet 0   Misc Natural Products (JOINT HEALTH PO) Take by mouth.     montelukast (SINGULAIR) 10 MG tablet TAKE 1 TABLET BY MOUTH EVERYDAY AT BEDTIME 30 tablet 5   Multiple Vitamins-Minerals (WOMENS MULTIVITAMIN PO) Take by mouth.     OXYGEN Inhale into the lungs.     Probiotic Product (PROBIOTIC PO) Take by mouth.     QUEtiapine (SEROQUEL XR) 400 MG 24 hr tablet Take 1 tablet (400 mg total) by mouth at bedtime. (Patient taking differently: Take 450 mg by mouth at bedtime.) 30 tablet 1   QUEtiapine (SEROQUEL XR) 50 MG TB24 24 hr tablet Take 100 mg by mouth at bedtime.     Spacer/Aero-Holding Chambers (AEROCHAMBER PLUS) inhaler Use as instructed 1 each 2   temazepam (RESTORIL) 15 MG capsule Take 1 capsule (15 mg total) by mouth at bedtime. 30 capsule 0   traMADol (ULTRAM) 50 MG tablet Take 50 mg by mouth every 8 (eight) hours as needed.     triamterene-hydrochlorothiazide (MAXZIDE-25) 37.5-25 MG tablet TAKE 1 TABLET BY MOUTH EVERY DAY 30 tablet 2   UNABLE TO FIND Med Name: magnesium L theronate  650 mg at HS     gabapentin (NEURONTIN) 400 MG capsule TAKE 1 CAPSULE (400 MG TOTAL) BY MOUTH 2 (TWO) TIMES DAILY. (Patient taking differently: Take 400 mg by mouth 3 (three) times daily.) 60 capsule 12   indomethacin (INDOCIN) 25 MG capsule Take one pill po once or twice a day prn headahce (Patient not taking: Reported on 05/25/2021) 30 capsule 0   Rimegepant Sulfate (NURTEC) 75 MG TBDP Take  75 mg by mouth daily as needed. (Patient not taking: Reported on 05/25/2021) 8 tablet 6   rizatriptan (MAXALT-MLT) 10 MG disintegrating tablet Take 1 tablet (10 mg total) by mouth as needed for migraine. May repeat in 2 hours if needed (Patient not taking: Reported on 05/25/2021) 9 tablet 11   No facility-administered medications prior to visit.    PAST MEDICAL HISTORY: Past Medical History:  Diagnosis Date   Allergy  Anxiety    Asthma    Depression    Hyperlipidemia    Hypertension    Migraines    Osteopenia after menopause    Persistent headaches    Pneumonia 2020   Postherpetic neuralgia    Renal disorder    Shingles outbreak 02/2014   Skin cancer    moles on right groin and right buttock.    PAST SURGICAL HISTORY: Past Surgical History:  Procedure Laterality Date   CARDIOVASCULAR STRESS TEST  02/2009   treadmill stress test: Low risk   CESAREAN SECTION  1987, 1989   COLONOSCOPY     2014/2015 [place], normal   EXPLORATORY LAPAROTOMY     laproscopy for infertility   EYE SURGERY Bilateral    laser-correct opening b/tn cornea and iris   MOLE REMOVAL  2017   x 2 moles    FAMILY HISTORY: Family History  Problem Relation Age of Onset   Hypertension Mother    Coronary artery disease Mother    Heart failure Mother    Transient ischemic attack Mother    Hypertension Father    Liver disease Sister        liver failure   Migraines Sister    Arrhythmia Brother    Hypertension Brother    Cancer Neg Hx        colon    Breast cancer Neg Hx     SOCIAL HISTORY:  Social History   Socioeconomic History   Marital status: Legally Separated    Spouse name: Not on file   Number of children: 2   Years of education: Not on file   Highest education level: Not on file  Occupational History   Occupation: Control and instrumentation engineer   Occupation: retired  Tobacco Use   Smoking status: Never   Smokeless tobacco: Never  Scientific laboratory technician Use: Never used  Substance and  Sexual Activity   Alcohol use: No    Alcohol/week: 0.0 standard drinks   Drug use: No   Sexual activity: Not Currently  Other Topics Concern   Not on file  Social History Narrative   Regular exercise- yes, spins daily   Diet: fruits and veggies, water    No caffeine   Lives alone    Social Determinants of Health   Financial Resource Strain: Low Risk    Difficulty of Paying Living Expenses: Not hard at all  Food Insecurity: No Food Insecurity   Worried About Charity fundraiser in the Last Year: Never true   [place] in the Last Year: Never true  Transportation Needs: No Transportation Needs   Lack of Transportation (Medical): No   Lack of Transportation (Non-Medical): No  Physical Activity: Inactive   Days of Exercise per Week: 0 days   Minutes of Exercise per Session: 0 min  Stress: No Stress Concern Present   Feeling of Stress : Not at all  Social Connections: Moderately Isolated   Frequency of Communication with Friends and Family: Twice a week   Frequency of Social Gatherings with Friends and Family: Twice a week   Attends Religious Services: More than 4 times per year   Active Member of [employer] or Organizations: No   Attends Archivist Meetings: Never   Marital Status: Separated  Intimate Partner Violence: Not At Risk   Fear of Current or Ex-Partner: No   Emotionally Abused: No   Physically Abused: No   Sexually Abused: No     PHYSICAL EXAM  Vitals:   05/25/21 1341  BP: 122/73  Pulse: 76  Weight: 114 lb 3.2 oz (51.8 kg)  Height: 5\' 1"  (1.549 m)   Wt Readings from Last 3 Encounters:  05/25/21 114 lb 3.2 oz (51.8 kg)  02/25/21 116 lb 3.2 oz (52.7 kg)  12/18/20 114 lb 12.8 oz (52.1 kg)   Body mass index is 21.58 kg/m.  No results found.  MMSE - Mini Mental State Exam 06/03/2016  Orientation to time 5  Orientation to Place 5  Registration 3  Attention/ Calculation 5  Recall 3  Language- name 2 objects 2  Language- repeat 1  Language-  follow 3 step command 3  Language- read & follow direction 1  Write a sentence 1  Copy design 1  Total score 30    GENERAL EXAM: Patient is in no distress; well developed, nourished and groomed; neck is supple  CARDIOVASCULAR: Regular rate and rhythm, no murmurs, no carotid bruits; BRADYCARDIA  NEUROLOGIC: MENTAL STATUS: awake, alert, language fluent, comprehension intact, naming intact, fund of knowledge appropriate CRANIAL NERVE: pupils equal and reactive to light, visual fields full to confrontation, extraocular muscles intact, no nystagmus, facial sensation and strength symmetric, hearing intact, palate elevates symmetrically, uvula midline, shoulder shrug symmetric, tongue midline. MOTOR: normal bulk and tone, full strength in the BUE, BLE SENSORY: normal and symmetric to light touch, temperature, vibration COORDINATION: finger-nose-finger, fine finger movements normal REFLEXES: deep tendon reflexes present and symmetric GAIT/STATION: narrow based gait; romberg is negative    DIAGNOSTIC DATA (LABS, IMAGING, TESTING) - I reviewed patient records, labs, notes, testing and imaging myself where available.  Lab Results  Component Value Date   WBC 10.7 12/10/2020   HGB 12.2 12/10/2020   HCT 36.5 12/10/2020   MCV 91 12/10/2020   PLT 292 12/10/2020      Component Value Date/Time   NA 140 12/10/2020 0954   K 4.4 12/10/2020 0954   CL 99 12/10/2020 0954   CO2 27 12/10/2020 0954   GLUCOSE 107 (H) 12/10/2020 0954   GLUCOSE 81 03/03/2020 1204   BUN 18 12/10/2020 0954   CREATININE 0.75 12/10/2020 0954   CREATININE 0.90 06/08/2017 1534   CALCIUM 9.6 12/10/2020 0954   PROT 6.8 12/10/2020 0954   ALBUMIN 4.6 12/10/2020 0954   AST 48 (H) 12/10/2020 0954   ALT 41 (H) 12/10/2020 0954   ALKPHOS 85 12/10/2020 0954   BILITOT <0.2 12/10/2020 0954   GFRNONAA >60 02/11/2019 0702   GFRAA >60 02/11/2019 0702   Lab Results  Component Value Date   CHOL 158 03/03/2020   HDL 51.90  03/03/2020   LDLCALC 96 03/03/2020   TRIG 52.0 03/03/2020   CHOLHDL 3 03/03/2020   Lab Results  Component Value Date   HGBA1C 5.4 12/10/2020   Lab Results  Component Value Date   VITAMINB12 1,257 (H) 01/12/2021   Lab Results  Component Value Date   TSH 2.830 12/10/2020    11/13/08 LP - opening pressure 7cm H2O; WBC 1, RBC 0, glucose 62, protein 26, OCB (2 in CSF, not seen in serum), IgG index 0.5 (normal), lyme PCR neg, EBV PCR neg  11/13/08 VEP - normal  11/08/08 MRI cervical - normal  11/08/08 MRI brain (with and without contrast) - multiple supratentorial, periventricular and juxtacortical white matter lesions which may represent chronic demyelinating plaques or perivascular gliosis.  No abnormal enhancement on postcontrast views.   08/28/17 MRI brain  1.  Scattered T2/FLAIR hyperintense foci predominantly in the deep and subcortical  white matter.  This is a nonspecific finding and most likely represents chronic microvascular ischemic change.  The pattern is not typical for demyelination.  The foci are not acute and they do not enhance after contrast.  When compared to the MRI dated 05/20/2004, there has been only slight progression in the number or size of the foci. 2.  There is a normal enhancement pattern and there are no acute findings.   09/30/18 MRI brain [I reviewed images myself and agree with interpretation. -VRP]  - No acute or subacute infarction. - Numerous scattered foci of T2 and FLAIR signal within the cerebral hemispheric deep and subcortical white matter. The differential diagnosis on the basis of the imaging would be demyelinating disease versus small-vessel disease. Findings appear stable since June of last year but are progressive since 2006. Given the history of hypertension and hyperlipidemia, small-vessel disease is probably more likely  09/21/19 MRI brain - Abnormal MRI scan of the brain showing bilateral nonspecific periventricular and subcortical white matter  hyperintensities with a differential discussed above.  No enhancing lesions are noted.  There are mild incidental changes of chronic paranasal sinus inflammation.  Overall no significant change compared with previous MRI from 09/30/2018  12/16/20: This MRI of the brain with and without contrast shows the following: 1.   Scattered T2/FLAIR hyperintense foci predominantly in the subcortical and deep white matter of both hemispheres.  This is a nonspecific finding but is most consistent with chronic microvascular ischemic change.  Demyelination or vasculitis would be less likely to have this pattern.  None of the foci appear to be acute.  They do not enhance.  Compared to the MRI dated 09/21/2019, there are no new lesions. 2.   Normal enhancement pattern.  No acute findings.   ASSESSMENT AND PLAN  66 y.o. year old female here with mixed tension and migraine headaches, left occipital neuralgia, and longer standing significant depression/anxiety. Some headaches have occipital neuralgia type features. Symptoms seem to be worse with stress and external factors.   Meds tried: gabapentin, propranolol, indomethacin, ibuprofen, tylenol, toradol  Dx:  Migraine with aura and without status migrainosus, not intractable    PLAN:  MIGRAINE WITH AURA (history of angle closure glaucoma; cannot use topiramate)  MIGRAINE PREVENTION  LIFESTYLE CHANGES -Stop or avoid smoking -Decrease or avoid caffeine / alcohol -Eat and sleep on a regular schedule -Exercise several times per week - continue fremanezumab (Ajovy) 225mg  monthly - ok to stop gabapentin  MIGRAINE RESCUE  - ibuprofen, tylenol as needed  MILD SLEEP APNEA - follow up with pulmonary / ENT / PCP (already had home sleep study; now on oxygen)  Schizoaffective schizophrenia / anxiety / depression (per psychiatry) - continue seroquel 450mg  at bedtime  Return in about 1 year (around 05/25/2022) for with NP Ward Givens).     Penni Bombard, MD 2/72/5366, 4:40 PM Certified in Neurology, Neurophysiology and Neuroimaging  [facility] [address] [place], [place] [address] [phone]

## 2021-05-26 DIAGNOSIS — M9904 Segmental and somatic dysfunction of sacral region: Secondary | ICD-10-CM | POA: Diagnosis not present

## 2021-05-26 DIAGNOSIS — M9903 Segmental and somatic dysfunction of lumbar region: Secondary | ICD-10-CM | POA: Diagnosis not present

## 2021-05-26 DIAGNOSIS — M9908 Segmental and somatic dysfunction of rib cage: Secondary | ICD-10-CM | POA: Diagnosis not present

## 2021-05-26 DIAGNOSIS — M9907 Segmental and somatic dysfunction of upper extremity: Secondary | ICD-10-CM | POA: Diagnosis not present

## 2021-05-26 DIAGNOSIS — M25521 Pain in right elbow: Secondary | ICD-10-CM | POA: Diagnosis not present

## 2021-05-26 DIAGNOSIS — M9905 Segmental and somatic dysfunction of pelvic region: Secondary | ICD-10-CM | POA: Diagnosis not present

## 2021-05-26 DIAGNOSIS — M9906 Segmental and somatic dysfunction of lower extremity: Secondary | ICD-10-CM | POA: Diagnosis not present

## 2021-05-26 DIAGNOSIS — M9902 Segmental and somatic dysfunction of thoracic region: Secondary | ICD-10-CM | POA: Diagnosis not present

## 2021-05-26 DIAGNOSIS — G894 Chronic pain syndrome: Secondary | ICD-10-CM | POA: Diagnosis not present

## 2021-05-26 DIAGNOSIS — M542 Cervicalgia: Secondary | ICD-10-CM | POA: Diagnosis not present

## 2021-05-26 DIAGNOSIS — M9901 Segmental and somatic dysfunction of cervical region: Secondary | ICD-10-CM | POA: Diagnosis not present

## 2021-05-26 DIAGNOSIS — M545 Low back pain, unspecified: Secondary | ICD-10-CM | POA: Diagnosis not present

## 2021-06-01 DIAGNOSIS — F411 Generalized anxiety disorder: Secondary | ICD-10-CM | POA: Diagnosis not present

## 2021-06-01 DIAGNOSIS — F3132 Bipolar disorder, current episode depressed, moderate: Secondary | ICD-10-CM | POA: Diagnosis not present

## 2021-06-02 DIAGNOSIS — Z20822 Contact with and (suspected) exposure to covid-19: Secondary | ICD-10-CM | POA: Diagnosis not present

## 2021-06-09 DIAGNOSIS — F3132 Bipolar disorder, current episode depressed, moderate: Secondary | ICD-10-CM | POA: Diagnosis not present

## 2021-06-09 DIAGNOSIS — F411 Generalized anxiety disorder: Secondary | ICD-10-CM | POA: Diagnosis not present

## 2021-06-13 DIAGNOSIS — Z20822 Contact with and (suspected) exposure to covid-19: Secondary | ICD-10-CM | POA: Diagnosis not present

## 2021-06-16 DIAGNOSIS — B001 Herpesviral vesicular dermatitis: Secondary | ICD-10-CM | POA: Diagnosis not present

## 2021-06-16 DIAGNOSIS — Z09 Encounter for follow-up examination after completed treatment for conditions other than malignant neoplasm: Secondary | ICD-10-CM | POA: Diagnosis not present

## 2021-06-16 DIAGNOSIS — D225 Melanocytic nevi of trunk: Secondary | ICD-10-CM | POA: Diagnosis not present

## 2021-06-16 DIAGNOSIS — L578 Other skin changes due to chronic exposure to nonionizing radiation: Secondary | ICD-10-CM | POA: Diagnosis not present

## 2021-06-16 DIAGNOSIS — Z86018 Personal history of other benign neoplasm: Secondary | ICD-10-CM | POA: Diagnosis not present

## 2021-06-16 DIAGNOSIS — L821 Other seborrheic keratosis: Secondary | ICD-10-CM | POA: Diagnosis not present

## 2021-06-16 DIAGNOSIS — H01116 Allergic dermatitis of left eye, unspecified eyelid: Secondary | ICD-10-CM | POA: Diagnosis not present

## 2021-06-17 ENCOUNTER — Other Ambulatory Visit: Payer: Self-pay | Admitting: Family Medicine

## 2021-06-22 ENCOUNTER — Telehealth: Payer: Self-pay

## 2021-06-22 ENCOUNTER — Encounter: Payer: Self-pay | Admitting: Diagnostic Neuroimaging

## 2021-06-22 DIAGNOSIS — Z20822 Contact with and (suspected) exposure to covid-19: Secondary | ICD-10-CM | POA: Diagnosis not present

## 2021-06-22 NOTE — Telephone Encounter (Signed)
LAST OV: 08/2020 ? ?NEXT OV:07/23/21 ? ?MEDICATION: triamterene-hydrochlorothiazide (MAXZIDE-25) 37.5-25 MG tablet ? ?PHARMACY: [id number] [address] [place]? ?Comments:  ? ?**Let patient know to contact pharmacy at the end of the day to make sure medication is ready. ** ? ?** Please notify patient to allow 48-72 hours to process** ? ?**Encourage patient to contact the pharmacy for refills or they can request refills through [facility]* ? ? ?

## 2021-06-23 ENCOUNTER — Other Ambulatory Visit: Payer: Self-pay

## 2021-06-23 ENCOUNTER — Telehealth: Payer: Self-pay | Admitting: *Deleted

## 2021-06-23 MED ORDER — TRIAMTERENE-HCTZ 37.5-25 MG PO TABS: 1.0000 | ORAL_TABLET | Freq: Every day | ORAL | 2 refills | Status: DC

## 2021-06-23 NOTE — Telephone Encounter (Signed)
Patient has reported ongoing daily headaches. Per Dr Leta Baptist she may receive migraine infusion. She is scheduled for today 2 pm. She will receive Depacon and Toradol. Rx on MD desk for review, signature.  ?

## 2021-06-23 NOTE — Telephone Encounter (Signed)
Infusion orders signed and given to [employer].  ?

## 2021-06-23 NOTE — Telephone Encounter (Signed)
Rx sent 

## 2021-06-24 ENCOUNTER — Institutional Professional Consult (permissible substitution): Payer: Medicare Other | Admitting: Neurology

## 2021-06-26 DIAGNOSIS — Z20822 Contact with and (suspected) exposure to covid-19: Secondary | ICD-10-CM | POA: Diagnosis not present

## 2021-07-01 DIAGNOSIS — F3132 Bipolar disorder, current episode depressed, moderate: Secondary | ICD-10-CM | POA: Diagnosis not present

## 2021-07-01 DIAGNOSIS — F411 Generalized anxiety disorder: Secondary | ICD-10-CM | POA: Diagnosis not present

## 2021-07-06 ENCOUNTER — Telehealth: Payer: Self-pay | Admitting: Diagnostic Neuroimaging

## 2021-07-06 DIAGNOSIS — F3132 Bipolar disorder, current episode depressed, moderate: Secondary | ICD-10-CM | POA: Diagnosis not present

## 2021-07-06 DIAGNOSIS — F411 Generalized anxiety disorder: Secondary | ICD-10-CM | POA: Diagnosis not present

## 2021-07-06 DIAGNOSIS — H401132 Primary open-angle glaucoma, bilateral, moderate stage: Secondary | ICD-10-CM | POA: Diagnosis not present

## 2021-07-06 NOTE — Telephone Encounter (Signed)
Pt is requesting a portable oxygen concentrate for a trip she is going on for one week. Informed pt that she will need to speak with Dr. Rexene Alberts at sleep consult in May. Pt would like a call back to explain if can or cannot put in an order.  ?

## 2021-07-06 NOTE — Telephone Encounter (Signed)
Pt called back wanting to speak with nurse.  ?

## 2021-07-06 NOTE — Telephone Encounter (Signed)
I called pt and left vm advising Dr. Leta Baptist unfortunately cannot put this order in. Advised if she wanted to try and move up her appt with Dr. Rexene Alberts should could so this could be further discussed.  ?

## 2021-07-06 NOTE — Telephone Encounter (Signed)
I called pt. No answer, left a message asking pt to call me back.   

## 2021-07-07 ENCOUNTER — Telehealth: Payer: Self-pay | Admitting: Internal Medicine

## 2021-07-07 DIAGNOSIS — M25521 Pain in right elbow: Secondary | ICD-10-CM | POA: Diagnosis not present

## 2021-07-07 DIAGNOSIS — M25522 Pain in left elbow: Secondary | ICD-10-CM | POA: Diagnosis not present

## 2021-07-08 NOTE — Telephone Encounter (Signed)
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  She only uses her oxygen at night.  She was asking about a POC since she cannot move her big oxygen concentrator with her.  I advised her that to get a POC you have to qualify for daytime oxygen to get a POC.  I let her know that the POC cannot be used at night because it is pulsed, continuous and it works by your breathing in through  your nose and you breathe differently while you are asleep.  I told her that if she is going out of town, she can contact her DME company and have them set up oxygen where she is going in the future.  I also told her that if she travels a lot the [employer] has a lighter concentrator that can be put in a suitcase and taken on a plane.  She stated she did not travel a lot, she just did not think about her oxygen and going out of town ahead of time to make arrangements.  Her neurologist is arranging for a sleep study for her to make sure she has everything she needs.  She verbalized understanding.  Nothing further needed. ?

## 2021-07-13 DIAGNOSIS — Z20822 Contact with and (suspected) exposure to covid-19: Secondary | ICD-10-CM | POA: Diagnosis not present

## 2021-07-14 DIAGNOSIS — M25522 Pain in left elbow: Secondary | ICD-10-CM | POA: Diagnosis not present

## 2021-07-20 DIAGNOSIS — F3132 Bipolar disorder, current episode depressed, moderate: Secondary | ICD-10-CM | POA: Diagnosis not present

## 2021-07-20 DIAGNOSIS — F411 Generalized anxiety disorder: Secondary | ICD-10-CM | POA: Diagnosis not present

## 2021-07-22 DIAGNOSIS — M25511 Pain in right shoulder: Secondary | ICD-10-CM | POA: Diagnosis not present

## 2021-07-22 DIAGNOSIS — M9902 Segmental and somatic dysfunction of thoracic region: Secondary | ICD-10-CM | POA: Diagnosis not present

## 2021-07-22 DIAGNOSIS — R051 Acute cough: Secondary | ICD-10-CM | POA: Diagnosis not present

## 2021-07-22 DIAGNOSIS — M9903 Segmental and somatic dysfunction of lumbar region: Secondary | ICD-10-CM | POA: Diagnosis not present

## 2021-07-22 DIAGNOSIS — M542 Cervicalgia: Secondary | ICD-10-CM | POA: Diagnosis not present

## 2021-07-22 DIAGNOSIS — M25521 Pain in right elbow: Secondary | ICD-10-CM | POA: Diagnosis not present

## 2021-07-22 DIAGNOSIS — R059 Cough, unspecified: Secondary | ICD-10-CM | POA: Diagnosis not present

## 2021-07-22 DIAGNOSIS — M9908 Segmental and somatic dysfunction of rib cage: Secondary | ICD-10-CM | POA: Diagnosis not present

## 2021-07-22 DIAGNOSIS — M9901 Segmental and somatic dysfunction of cervical region: Secondary | ICD-10-CM | POA: Diagnosis not present

## 2021-07-22 DIAGNOSIS — Z20822 Contact with and (suspected) exposure to covid-19: Secondary | ICD-10-CM | POA: Diagnosis not present

## 2021-07-23 ENCOUNTER — Ambulatory Visit: Payer: Medicare Other | Admitting: Family Medicine

## 2021-07-28 ENCOUNTER — Telehealth: Payer: Self-pay

## 2021-07-28 ENCOUNTER — Ambulatory Visit: Payer: Medicare Other | Admitting: Neurology

## 2021-07-28 NOTE — Telephone Encounter (Signed)
Left a voicemail for patient that we were cancelling her 1:45pm appt with Dr. Rexene Alberts as she was scheduled in error. She recently had a sleep study in September of 2022 and will need to follow up with Rexene Edison, NP with Pulmonology for any further sleep concerns.   ?

## 2021-07-29 ENCOUNTER — Encounter: Payer: Self-pay | Admitting: Family Medicine

## 2021-07-29 ENCOUNTER — Ambulatory Visit (INDEPENDENT_AMBULATORY_CARE_PROVIDER_SITE_OTHER): Payer: Medicare Other | Admitting: Family Medicine

## 2021-07-29 VITALS — BP 122/72 | HR 67 | Temp 97.5°F | Resp 16 | Ht 61.0 in | Wt 111.8 lb

## 2021-07-29 DIAGNOSIS — M8580 Other specified disorders of bone density and structure, unspecified site: Secondary | ICD-10-CM

## 2021-07-29 DIAGNOSIS — E785 Hyperlipidemia, unspecified: Secondary | ICD-10-CM | POA: Diagnosis not present

## 2021-07-29 DIAGNOSIS — M8588 Other specified disorders of bone density and structure, other site: Secondary | ICD-10-CM

## 2021-07-29 DIAGNOSIS — I10 Essential (primary) hypertension: Secondary | ICD-10-CM | POA: Diagnosis not present

## 2021-07-29 DIAGNOSIS — Z20822 Contact with and (suspected) exposure to covid-19: Secondary | ICD-10-CM | POA: Diagnosis not present

## 2021-07-29 DIAGNOSIS — J4530 Mild persistent asthma, uncomplicated: Secondary | ICD-10-CM | POA: Diagnosis not present

## 2021-07-29 DIAGNOSIS — N6452 Nipple discharge: Secondary | ICD-10-CM | POA: Diagnosis not present

## 2021-07-29 LAB — CBC WITH DIFFERENTIAL/PLATELET
Basophils Absolute: 0 10*3/uL (ref 0.0–0.1)
Basophils Relative: 0.6 % (ref 0.0–3.0)
Eosinophils Absolute: 0 10*3/uL (ref 0.0–0.7)
Eosinophils Relative: 0.4 % (ref 0.0–5.0)
HCT: 36.6 % (ref 36.0–46.0)
Hemoglobin: 12.4 g/dL (ref 12.0–15.0)
Lymphocytes Relative: 22.4 % (ref 12.0–46.0)
Lymphs Abs: 1.5 10*3/uL (ref 0.7–4.0)
MCHC: 34 g/dL (ref 30.0–36.0)
MCV: 90.8 fl (ref 78.0–100.0)
Monocytes Absolute: 0.3 10*3/uL (ref 0.1–1.0)
Monocytes Relative: 5.1 % (ref 3.0–12.0)
Neutro Abs: 4.7 10*3/uL (ref 1.4–7.7)
Neutrophils Relative %: 71.5 % (ref 43.0–77.0)
Platelets: 257 10*3/uL (ref 150.0–400.0)
RBC: 4.03 Mil/uL (ref 3.87–5.11)
RDW: 12.7 % (ref 11.5–15.5)
WBC: 6.6 10*3/uL (ref 4.0–10.5)

## 2021-07-29 LAB — VITAMIN D 25 HYDROXY (VIT D DEFICIENCY, FRACTURES): VITD: 91.27 ng/mL (ref 30.00–100.00)

## 2021-07-29 LAB — LIPID PANEL
Cholesterol: 174 mg/dL (ref 0–200)
HDL: 70.2 mg/dL (ref >39.00)
LDL Cholesterol: 92 mg/dL (ref 0–99)
NonHDL: 103.59
Total CHOL/HDL Ratio: 2
Triglycerides: 56 mg/dL (ref 0.0–149.0)
VLDL: 11.2 mg/dL (ref 0.0–40.0)

## 2021-07-29 LAB — HEPATIC FUNCTION PANEL
ALT: 33 U/L (ref 0–35)
AST: 28 U/L (ref 0–37)
Albumin: 4.4 g/dL (ref 3.5–5.2)
Alkaline Phosphatase: 61 U/L (ref 39–117)
Bilirubin, Direct: 0.1 mg/dL (ref 0.0–0.3)
Total Bilirubin: 0.4 mg/dL (ref 0.2–1.2)
Total Protein: 6.9 g/dL (ref 6.0–8.3)

## 2021-07-29 LAB — BASIC METABOLIC PANEL
BUN: 20 mg/dL (ref 6–23)
CO2: 29 mEq/L (ref 19–32)
Calcium: 9 mg/dL (ref 8.4–10.5)
Chloride: 100 mEq/L (ref 96–112)
Creatinine, Ser: 0.67 mg/dL (ref 0.40–1.20)
GFR: 90.98 mL/min (ref >60.00)
Glucose, Bld: 92 mg/dL (ref 70–99)
Potassium: 3.5 mEq/L (ref 3.5–5.1)
Sodium: 134 mEq/L — ABNORMAL LOW (ref 135–145)

## 2021-07-29 LAB — TSH: TSH: 0.66 u[IU]/mL (ref 0.35–5.50)

## 2021-07-29 MED ORDER — ALBUTEROL SULFATE HFA 108 (90 BASE) MCG/ACT IN AERS: INHALATION_SPRAY | RESPIRATORY_TRACT | 2 refills | Status: DC

## 2021-07-29 NOTE — Patient Instructions (Signed)
Follow up in 6 months to recheck BP and cholesterol ?We'll notify you of your lab results and make any changes if needed ?Keep up the good work!  You look great! ?[facility] to schedule ?Call and schedule your colonoscopy at [phone] ?Keep up the good work on healthy diet and regular exercise- you look great! ?Call with any questions or concerns ?Stay Safe!  Stay Healthy! ?

## 2021-07-29 NOTE — Progress Notes (Signed)
? ? ?   Patient ID: Theresa Martin, female    DOB: Apr 26, 1955, 66 y.o.   MRN: [id number] ? ?HPI ?HTN- chronic problem, on Triamterene HCTZ 37.5/'25mg'$  w/ good control.  Denies CP, SOB above baseline, visual changes, edema.  + chronic migraines. ? ?Hyperlipidemia- chronic problem, on Lipitor '20mg'$  daily.  Denies abd pain, N/V. ? ?Breast d/c- pt reports she has always had discharge from R nipple but just recently developed discharge from L nipple.  D/c is 'dry powdery looking'.  Not liquid or bloody.  No breast pain. ? ?Colon cancer screen- pt is due for repeat colonoscopy.  She needs to schedule. ? ? ?Review of Systems ?For ROS see HPI  ?   ?Objective:  ? Physical Exam ?Vitals reviewed.  ?Constitutional:   ?   General: She is not in acute distress. ?   Appearance: Normal appearance. She is well-developed. She is not ill-appearing.  ?HENT:  ?   Head: Normocephalic and atraumatic.  ?Eyes:  ?   Conjunctiva/sclera: Conjunctivae normal.  ?   Pupils: Pupils are equal, round, and reactive to light.  ?Neck:  ?   Thyroid: No thyromegaly.  ?Cardiovascular:  ?   Rate and Rhythm: Normal rate and regular rhythm.  ?   Pulses: Normal pulses.  ?   Heart sounds: Normal heart sounds. No murmur heard. ?Pulmonary:  ?   Effort: Pulmonary effort is normal. No respiratory distress.  ?   Breath sounds: Normal breath sounds.  ?Abdominal:  ?   General: There is no distension.  ?   Palpations: Abdomen is soft.  ?   Tenderness: There is no abdominal tenderness.  ?Musculoskeletal:  ?   Cervical back: Normal range of motion and neck supple.  ?   Right lower leg: No edema.  ?   Left lower leg: No edema.  ?Lymphadenopathy:  ?   Cervical: No cervical adenopathy.  ?Skin: ?   General: Skin is warm and dry.  ?Neurological:  ?   General: No focal deficit present.  ?   Mental Status: She is alert and oriented to person, place, and time.  ?Psychiatric:     ?   Mood and Affect: Mood normal.     ?   Behavior: Behavior normal.  ? ? ? ? ? ?   ?Assessment &  Plan:  ? ?Breast d/c- pt reports she has a long hx of this on R side but just developed sxs on L.  No pain or bleeding.  May be related to some of her medications.  Will get diagnostic mammo to assess.  Pt expressed understanding and is in agreement w/ plan.  ?

## 2021-07-30 ENCOUNTER — Telehealth: Payer: Self-pay

## 2021-07-30 DIAGNOSIS — G43109 Migraine with aura, not intractable, without status migrainosus: Secondary | ICD-10-CM | POA: Diagnosis not present

## 2021-07-30 DIAGNOSIS — Z20822 Contact with and (suspected) exposure to covid-19: Secondary | ICD-10-CM | POA: Diagnosis not present

## 2021-07-30 NOTE — Telephone Encounter (Signed)
-----   Message from [name], MD sent at 07/30/2021  7:41 AM EDT ----- ?Labs look great!  No changes at this time ?

## 2021-07-30 NOTE — Telephone Encounter (Signed)
Spoke w/pt and advised of lab results . Pt expressed verbal understanding  ?

## 2021-08-01 DIAGNOSIS — Z20822 Contact with and (suspected) exposure to covid-19: Secondary | ICD-10-CM | POA: Diagnosis not present

## 2021-08-02 DIAGNOSIS — Z20822 Contact with and (suspected) exposure to covid-19: Secondary | ICD-10-CM | POA: Diagnosis not present

## 2021-08-02 NOTE — Assessment & Plan Note (Signed)
Chronic problem.  Well controlled on Triamterene HCTZ 37.5/'25mg'$  daily.  Currently asymptomatic.  Check labs due to diuretic use.  No anticipated med changes. ?

## 2021-08-02 NOTE — Assessment & Plan Note (Signed)
Chronic problem.  Tolerating Lipitor 20mg daily w/o difficulty.  Check labs.  Adjust meds prn  

## 2021-08-02 NOTE — Assessment & Plan Note (Signed)
Check Vit D and replete prn. 

## 2021-08-03 ENCOUNTER — Telehealth: Payer: Self-pay | Admitting: Internal Medicine

## 2021-08-03 DIAGNOSIS — Z20822 Contact with and (suspected) exposure to covid-19: Secondary | ICD-10-CM | POA: Diagnosis not present

## 2021-08-03 DIAGNOSIS — F3132 Bipolar disorder, current episode depressed, moderate: Secondary | ICD-10-CM | POA: Diagnosis not present

## 2021-08-03 DIAGNOSIS — F411 Generalized anxiety disorder: Secondary | ICD-10-CM | POA: Diagnosis not present

## 2021-08-03 NOTE — Telephone Encounter (Signed)
Spoke to patient. She is aware that there is assistance for albuterol, however she has not paid 600 dollars out of pocket this calendar year for medication. She stated that is it affordable with a 16 dollar co pay.  ?Nothing further needed at this time.  ? ?

## 2021-08-06 ENCOUNTER — Institutional Professional Consult (permissible substitution): Payer: Medicare Other | Admitting: Neurology

## 2021-08-12 DIAGNOSIS — M9902 Segmental and somatic dysfunction of thoracic region: Secondary | ICD-10-CM | POA: Diagnosis not present

## 2021-08-12 DIAGNOSIS — M9908 Segmental and somatic dysfunction of rib cage: Secondary | ICD-10-CM | POA: Diagnosis not present

## 2021-08-12 DIAGNOSIS — M9903 Segmental and somatic dysfunction of lumbar region: Secondary | ICD-10-CM | POA: Diagnosis not present

## 2021-08-12 DIAGNOSIS — M25522 Pain in left elbow: Secondary | ICD-10-CM | POA: Diagnosis not present

## 2021-08-12 DIAGNOSIS — M9901 Segmental and somatic dysfunction of cervical region: Secondary | ICD-10-CM | POA: Diagnosis not present

## 2021-08-12 DIAGNOSIS — M9905 Segmental and somatic dysfunction of pelvic region: Secondary | ICD-10-CM | POA: Diagnosis not present

## 2021-08-12 DIAGNOSIS — M9906 Segmental and somatic dysfunction of lower extremity: Secondary | ICD-10-CM | POA: Diagnosis not present

## 2021-08-12 DIAGNOSIS — M25561 Pain in right knee: Secondary | ICD-10-CM | POA: Diagnosis not present

## 2021-08-12 DIAGNOSIS — M25521 Pain in right elbow: Secondary | ICD-10-CM | POA: Diagnosis not present

## 2021-08-12 DIAGNOSIS — M545 Low back pain, unspecified: Secondary | ICD-10-CM | POA: Diagnosis not present

## 2021-08-13 ENCOUNTER — Telehealth: Payer: Self-pay | Admitting: *Deleted

## 2021-08-13 ENCOUNTER — Encounter: Payer: Self-pay | Admitting: *Deleted

## 2021-08-13 NOTE — Telephone Encounter (Addendum)
Patient has messaged re: ongoing headaches. She requested an infusion. Per Dr Leta Baptist she may have infusion today, Toradol, Depacon. I advised her in my chart to come into office as soon as she can per [employer], infusion suite. Of note she had Depacon on 5/4 and then 3/28 before that. Order today is for Depacon 1 gm IV, may repeat x 1, Toradol 30 mg IVP, may repeat x 1. Orders on MD desk for signature. Orders signed and given to [employer].

## 2021-08-13 NOTE — Telephone Encounter (Signed)
Patient messaged asking if she can come tomorrow, I replied that we are closed tomorrow. Per Maudie Mercury RN if patient wants infusion today she must be here by 2:30 pm. I have sent her my chart advising her of that.

## 2021-08-13 NOTE — Telephone Encounter (Signed)
Dr Leta Baptist approved for infusion today even though she received one 07/30/21. Patient was notified. Per Maudie Mercury RN:  patient called in here a bit ago and said she forgot she just had this done 2 weeks ago and wants to wait until around June 1st! Dr Leta Baptist notified.

## 2021-08-25 ENCOUNTER — Other Ambulatory Visit: Payer: Self-pay | Admitting: Family Medicine

## 2021-08-25 DIAGNOSIS — N6452 Nipple discharge: Secondary | ICD-10-CM

## 2021-08-31 DIAGNOSIS — F411 Generalized anxiety disorder: Secondary | ICD-10-CM | POA: Diagnosis not present

## 2021-08-31 DIAGNOSIS — F3181 Bipolar II disorder: Secondary | ICD-10-CM | POA: Diagnosis not present

## 2021-08-31 DIAGNOSIS — F3132 Bipolar disorder, current episode depressed, moderate: Secondary | ICD-10-CM | POA: Diagnosis not present

## 2021-09-03 DIAGNOSIS — F411 Generalized anxiety disorder: Secondary | ICD-10-CM | POA: Diagnosis not present

## 2021-09-03 DIAGNOSIS — F3132 Bipolar disorder, current episode depressed, moderate: Secondary | ICD-10-CM | POA: Diagnosis not present

## 2021-09-04 DIAGNOSIS — M9905 Segmental and somatic dysfunction of pelvic region: Secondary | ICD-10-CM | POA: Diagnosis not present

## 2021-09-04 DIAGNOSIS — M79601 Pain in right arm: Secondary | ICD-10-CM | POA: Diagnosis not present

## 2021-09-04 DIAGNOSIS — M545 Low back pain, unspecified: Secondary | ICD-10-CM | POA: Diagnosis not present

## 2021-09-04 DIAGNOSIS — M9908 Segmental and somatic dysfunction of rib cage: Secondary | ICD-10-CM | POA: Diagnosis not present

## 2021-09-04 DIAGNOSIS — M9906 Segmental and somatic dysfunction of lower extremity: Secondary | ICD-10-CM | POA: Diagnosis not present

## 2021-09-04 DIAGNOSIS — M9902 Segmental and somatic dysfunction of thoracic region: Secondary | ICD-10-CM | POA: Diagnosis not present

## 2021-09-04 DIAGNOSIS — M65341 Trigger finger, right ring finger: Secondary | ICD-10-CM | POA: Diagnosis not present

## 2021-09-04 DIAGNOSIS — M9907 Segmental and somatic dysfunction of upper extremity: Secondary | ICD-10-CM | POA: Diagnosis not present

## 2021-09-06 ENCOUNTER — Other Ambulatory Visit: Payer: Self-pay | Admitting: Diagnostic Neuroimaging

## 2021-09-10 DIAGNOSIS — D2239 Melanocytic nevi of other parts of face: Secondary | ICD-10-CM | POA: Diagnosis not present

## 2021-09-10 DIAGNOSIS — I781 Nevus, non-neoplastic: Secondary | ICD-10-CM | POA: Diagnosis not present

## 2021-09-11 ENCOUNTER — Telehealth: Payer: Self-pay | Admitting: Internal Medicine

## 2021-09-11 DIAGNOSIS — R079 Chest pain, unspecified: Secondary | ICD-10-CM | POA: Diagnosis not present

## 2021-09-11 DIAGNOSIS — M9908 Segmental and somatic dysfunction of rib cage: Secondary | ICD-10-CM | POA: Diagnosis not present

## 2021-09-11 DIAGNOSIS — M9903 Segmental and somatic dysfunction of lumbar region: Secondary | ICD-10-CM | POA: Diagnosis not present

## 2021-09-11 DIAGNOSIS — M9907 Segmental and somatic dysfunction of upper extremity: Secondary | ICD-10-CM | POA: Diagnosis not present

## 2021-09-11 DIAGNOSIS — M9901 Segmental and somatic dysfunction of cervical region: Secondary | ICD-10-CM | POA: Diagnosis not present

## 2021-09-11 DIAGNOSIS — M9902 Segmental and somatic dysfunction of thoracic region: Secondary | ICD-10-CM | POA: Diagnosis not present

## 2021-09-11 DIAGNOSIS — R071 Chest pain on breathing: Secondary | ICD-10-CM | POA: Diagnosis not present

## 2021-09-11 DIAGNOSIS — M542 Cervicalgia: Secondary | ICD-10-CM | POA: Diagnosis not present

## 2021-09-11 DIAGNOSIS — J4520 Mild intermittent asthma, uncomplicated: Secondary | ICD-10-CM

## 2021-09-11 DIAGNOSIS — M25511 Pain in right shoulder: Secondary | ICD-10-CM | POA: Diagnosis not present

## 2021-09-11 NOTE — Telephone Encounter (Signed)
Spoke to patient.  She is requesting a new aerochamber.  She also reports of right side chest discomfort x1w. She is concerned that she has pulled a muscle, as discomfort started after sleeping up straight in a chair. She is going to call PCP for recommendations.  Denied SOB, cough, wheezing, F/C/S or additional sx.    [name] please advise. Thanks Dr. Mortimer Fries is unavailable.

## 2021-09-11 NOTE — Telephone Encounter (Signed)
Ok to provide with aerochamber. Would advise she been seen by PCP or UC to evaluate chest pain

## 2021-09-11 NOTE — Telephone Encounter (Signed)
Spoke to patient and relayed below message.  Order placed to [employer] for aerochamer.  She will contact PCP. Nothing further needed.

## 2021-09-14 ENCOUNTER — Ambulatory Visit
Admission: RE | Admit: 2021-09-14 | Discharge: 2021-09-14 | Disposition: A | Discharge status: Home / Self Care | Payer: Medicare Other | Source: Ambulatory Visit | Attending: Family Medicine | Admitting: Family Medicine

## 2021-09-14 ENCOUNTER — Telehealth: Payer: Self-pay

## 2021-09-14 DIAGNOSIS — N6452 Nipple discharge: Secondary | ICD-10-CM

## 2021-09-14 NOTE — Telephone Encounter (Signed)
Called patient back she was asking about her bowel prep patient will call back to schedule colonoscopy she has none to bring and stay with her

## 2021-09-15 ENCOUNTER — Telehealth: Payer: Self-pay | Admitting: Family Medicine

## 2021-09-15 DIAGNOSIS — F411 Generalized anxiety disorder: Secondary | ICD-10-CM | POA: Diagnosis not present

## 2021-09-15 DIAGNOSIS — F3181 Bipolar II disorder: Secondary | ICD-10-CM | POA: Diagnosis not present

## 2021-09-15 DIAGNOSIS — F3132 Bipolar disorder, current episode depressed, moderate: Secondary | ICD-10-CM | POA: Diagnosis not present

## 2021-09-15 NOTE — Telephone Encounter (Signed)
Pt reports needs blood level checked post mammogram and the provider there recommended she has her prolactin checked   Asking to have labs done in [place] at [facility] over there.

## 2021-09-15 NOTE — Telephone Encounter (Signed)
Pt will need appt to discuss Prolactin level and any other blood work she may need.  This is also something she can discuss w/ her psychiatrist if she doesn't want to come here for an appt (as this can be caused by psychiatric medication)

## 2021-09-15 NOTE — Telephone Encounter (Signed)
She just had lab work done in May so I don't think we need anything at this time (but I'm not sure what she's referring to)

## 2021-09-15 NOTE — Telephone Encounter (Signed)
Pt called wanting to know if you need any additional blood work from her.

## 2021-09-15 NOTE — Telephone Encounter (Signed)
If you could review

## 2021-09-15 NOTE — Telephone Encounter (Signed)
Scheduled

## 2021-09-17 ENCOUNTER — Other Ambulatory Visit: Payer: Self-pay

## 2021-09-17 ENCOUNTER — Ambulatory Visit: Payer: Medicare Other | Admitting: Family Medicine

## 2021-09-17 DIAGNOSIS — E785 Hyperlipidemia, unspecified: Secondary | ICD-10-CM

## 2021-09-17 MED ORDER — ATORVASTATIN CALCIUM 20 MG PO TABS: 20.0000 mg | ORAL_TABLET | Freq: Every day | ORAL | 1 refills | Status: DC

## 2021-09-18 ENCOUNTER — Other Ambulatory Visit: Payer: Self-pay | Admitting: Sports Medicine

## 2021-09-18 ENCOUNTER — Ambulatory Visit
Admission: RE | Admit: 2021-09-18 | Discharge: 2021-09-18 | Disposition: A | Discharge status: Home / Self Care | Payer: Medicare Other | Source: Ambulatory Visit | Attending: Sports Medicine | Admitting: Sports Medicine

## 2021-09-18 ENCOUNTER — Telehealth: Payer: Self-pay

## 2021-09-18 ENCOUNTER — Other Ambulatory Visit: Payer: Self-pay

## 2021-09-18 DIAGNOSIS — M542 Cervicalgia: Secondary | ICD-10-CM

## 2021-09-18 DIAGNOSIS — M25522 Pain in left elbow: Secondary | ICD-10-CM | POA: Diagnosis not present

## 2021-09-18 DIAGNOSIS — Z1211 Encounter for screening for malignant neoplasm of colon: Secondary | ICD-10-CM

## 2021-09-18 DIAGNOSIS — M9907 Segmental and somatic dysfunction of upper extremity: Secondary | ICD-10-CM | POA: Diagnosis not present

## 2021-09-18 DIAGNOSIS — M25521 Pain in right elbow: Secondary | ICD-10-CM | POA: Diagnosis not present

## 2021-09-18 DIAGNOSIS — M9901 Segmental and somatic dysfunction of cervical region: Secondary | ICD-10-CM | POA: Diagnosis not present

## 2021-09-18 MED ORDER — NA SULFATE-K SULFATE-MG SULF 17.5-3.13-1.6 GM/177ML PO SOLN: 1.0000 | Freq: Once | ORAL | 0 refills | Status: AC

## 2021-09-19 ENCOUNTER — Other Ambulatory Visit: Payer: Self-pay | Admitting: Family Medicine

## 2021-09-21 ENCOUNTER — Other Ambulatory Visit: Payer: Self-pay | Admitting: Sports Medicine

## 2021-09-21 DIAGNOSIS — M5412 Radiculopathy, cervical region: Secondary | ICD-10-CM

## 2021-09-22 ENCOUNTER — Ambulatory Visit: Payer: Medicare Other | Admitting: Family Medicine

## 2021-09-23 ENCOUNTER — Telehealth: Payer: Self-pay | Admitting: Internal Medicine

## 2021-09-23 NOTE — Telephone Encounter (Signed)
Theresa Martin, can you assist with this. Order placed 09/11/2021

## 2021-09-24 NOTE — Telephone Encounter (Signed)
I called [employer] and spoke with Opal Sidles she gave me a fax # of [phone] It was faxed on Tuesday 09/22/21 but I refaxed the form today 09/24/21. I received confirmation that the fax was received

## 2021-09-24 NOTE — Telephone Encounter (Signed)
Since the form was for Theresa Martin it would have been faxed to the [facility]

## 2021-10-02 DIAGNOSIS — F411 Generalized anxiety disorder: Secondary | ICD-10-CM | POA: Diagnosis not present

## 2021-10-02 DIAGNOSIS — F3132 Bipolar disorder, current episode depressed, moderate: Secondary | ICD-10-CM | POA: Diagnosis not present

## 2021-10-07 DIAGNOSIS — F411 Generalized anxiety disorder: Secondary | ICD-10-CM | POA: Diagnosis not present

## 2021-10-07 DIAGNOSIS — F3181 Bipolar II disorder: Secondary | ICD-10-CM | POA: Diagnosis not present

## 2021-10-16 DIAGNOSIS — F411 Generalized anxiety disorder: Secondary | ICD-10-CM | POA: Diagnosis not present

## 2021-10-16 DIAGNOSIS — F3132 Bipolar disorder, current episode depressed, moderate: Secondary | ICD-10-CM | POA: Diagnosis not present

## 2021-10-19 ENCOUNTER — Encounter: Payer: Self-pay | Admitting: Gastroenterology

## 2021-10-20 ENCOUNTER — Ambulatory Visit
Admission: RE | Admit: 2021-10-20 | Discharge: 2021-10-20 | Disposition: A | Discharge status: Home / Self Care | Payer: Medicare Other | Attending: Gastroenterology | Admitting: Gastroenterology

## 2021-10-20 ENCOUNTER — Encounter: Payer: Self-pay | Admitting: Anesthesiology

## 2021-10-20 ENCOUNTER — Encounter: Payer: Self-pay | Admitting: Gastroenterology

## 2021-10-20 ENCOUNTER — Encounter
Admission: RE | Disposition: A | Discharge status: Home / Self Care | Payer: Self-pay | Source: Home / Self Care | Attending: Gastroenterology

## 2021-10-20 ENCOUNTER — Other Ambulatory Visit: Payer: Self-pay

## 2021-10-20 DIAGNOSIS — Z1211 Encounter for screening for malignant neoplasm of colon: Secondary | ICD-10-CM

## 2021-10-20 DIAGNOSIS — Z539 Procedure and treatment not carried out, unspecified reason: Secondary | ICD-10-CM | POA: Insufficient documentation

## 2021-10-20 HISTORY — PX: COLONOSCOPY WITH PROPOFOL: SHX5780

## 2021-10-20 SURGERY — COLONOSCOPY WITH PROPOFOL
Anesthesia: General

## 2021-10-20 MED ORDER — NA SULFATE-K SULFATE-MG SULF 17.5-3.13-1.6 GM/177ML PO SOLN: 354.0000 mL | Freq: Once | ORAL | 0 refills | Status: AC

## 2021-10-20 MED ORDER — SODIUM CHLORIDE 0.9 % IV SOLN: INTRAVENOUS | Status: DC

## 2021-10-20 NOTE — Progress Notes (Signed)
Patient arrived this morning for scheduled Colonoscopy with Dr Vicente Males.  During preoperative evaluation, patient stated that her stool still appears brown, although it is all liquid.  Dr Vicente Males discussed with the patient and it was decided to reschedule the procedure and to repeat her colon prep.  The patient agrees with this plan and will reschedule at her earliest convenience.

## 2021-10-21 ENCOUNTER — Encounter: Payer: Self-pay | Admitting: Gastroenterology

## 2021-10-21 ENCOUNTER — Other Ambulatory Visit: Payer: Medicare Other

## 2021-10-21 ENCOUNTER — Ambulatory Visit: Admit: 2021-10-21 | Payer: Medicare Other | Admitting: Gastroenterology

## 2021-10-21 SURGERY — COLONOSCOPY
Anesthesia: General

## 2021-10-26 ENCOUNTER — Other Ambulatory Visit: Payer: Medicare Other

## 2021-10-27 DIAGNOSIS — F411 Generalized anxiety disorder: Secondary | ICD-10-CM | POA: Diagnosis not present

## 2021-10-27 DIAGNOSIS — F3181 Bipolar II disorder: Secondary | ICD-10-CM | POA: Diagnosis not present

## 2021-11-02 ENCOUNTER — Other Ambulatory Visit: Payer: Medicare Other

## 2021-11-03 DIAGNOSIS — F3132 Bipolar disorder, current episode depressed, moderate: Secondary | ICD-10-CM | POA: Diagnosis not present

## 2021-11-03 DIAGNOSIS — F411 Generalized anxiety disorder: Secondary | ICD-10-CM | POA: Diagnosis not present

## 2021-11-12 ENCOUNTER — Ambulatory Visit
Admission: RE | Admit: 2021-11-12 | Discharge: 2021-11-12 | Disposition: A | Discharge status: Home / Self Care | Payer: Medicare Other | Source: Ambulatory Visit | Attending: Sports Medicine | Admitting: Sports Medicine

## 2021-11-12 DIAGNOSIS — M5412 Radiculopathy, cervical region: Secondary | ICD-10-CM

## 2021-11-14 ENCOUNTER — Other Ambulatory Visit: Payer: Self-pay | Admitting: Family Medicine

## 2021-11-24 DIAGNOSIS — M9907 Segmental and somatic dysfunction of upper extremity: Secondary | ICD-10-CM | POA: Diagnosis not present

## 2021-11-24 DIAGNOSIS — M25511 Pain in right shoulder: Secondary | ICD-10-CM | POA: Diagnosis not present

## 2021-11-24 DIAGNOSIS — M9901 Segmental and somatic dysfunction of cervical region: Secondary | ICD-10-CM | POA: Diagnosis not present

## 2021-11-24 DIAGNOSIS — M25531 Pain in right wrist: Secondary | ICD-10-CM | POA: Diagnosis not present

## 2021-11-24 DIAGNOSIS — M25521 Pain in right elbow: Secondary | ICD-10-CM | POA: Diagnosis not present

## 2021-11-24 DIAGNOSIS — M9908 Segmental and somatic dysfunction of rib cage: Secondary | ICD-10-CM | POA: Diagnosis not present

## 2021-11-24 DIAGNOSIS — M9902 Segmental and somatic dysfunction of thoracic region: Secondary | ICD-10-CM | POA: Diagnosis not present

## 2021-11-24 DIAGNOSIS — M25541 Pain in joints of right hand: Secondary | ICD-10-CM | POA: Diagnosis not present

## 2021-12-08 ENCOUNTER — Encounter: Payer: Self-pay | Admitting: Family Medicine

## 2021-12-08 DIAGNOSIS — F411 Generalized anxiety disorder: Secondary | ICD-10-CM | POA: Diagnosis not present

## 2021-12-08 DIAGNOSIS — F3181 Bipolar II disorder: Secondary | ICD-10-CM | POA: Diagnosis not present

## 2021-12-08 DIAGNOSIS — F3132 Bipolar disorder, current episode depressed, moderate: Secondary | ICD-10-CM | POA: Diagnosis not present

## 2021-12-08 NOTE — Telephone Encounter (Signed)
Recommendation from Mammogram performed 09/14/21: Recommend further evaluation of patient's hormonal nipple discharge on a clinical basis as consider assessing serum prolactin level. Otherwise, recommend continued annual bilateral screening mammographic follow-up.  Pt is asking if she should start with lab work and then move to imaging, looks like only recommended lab work without mention of imaging.  Does this order come from [place]?

## 2021-12-15 ENCOUNTER — Ambulatory Visit: Payer: Medicare Other | Admitting: Family Medicine

## 2021-12-16 DIAGNOSIS — H401132 Primary open-angle glaucoma, bilateral, moderate stage: Secondary | ICD-10-CM | POA: Diagnosis not present

## 2021-12-18 ENCOUNTER — Other Ambulatory Visit: Payer: Self-pay | Admitting: Sports Medicine

## 2021-12-18 ENCOUNTER — Ambulatory Visit: Payer: Medicare Other | Admitting: Family Medicine

## 2021-12-18 ENCOUNTER — Ambulatory Visit
Admission: RE | Admit: 2021-12-18 | Discharge: 2021-12-18 | Disposition: A | Discharge status: Home / Self Care | Payer: Medicare Other | Source: Ambulatory Visit | Attending: Sports Medicine | Admitting: Sports Medicine

## 2021-12-18 DIAGNOSIS — M9904 Segmental and somatic dysfunction of sacral region: Secondary | ICD-10-CM | POA: Diagnosis not present

## 2021-12-18 DIAGNOSIS — M25521 Pain in right elbow: Secondary | ICD-10-CM | POA: Diagnosis not present

## 2021-12-18 DIAGNOSIS — M9903 Segmental and somatic dysfunction of lumbar region: Secondary | ICD-10-CM | POA: Diagnosis not present

## 2021-12-18 DIAGNOSIS — M9902 Segmental and somatic dysfunction of thoracic region: Secondary | ICD-10-CM | POA: Diagnosis not present

## 2021-12-18 DIAGNOSIS — M545 Low back pain, unspecified: Secondary | ICD-10-CM | POA: Diagnosis not present

## 2021-12-18 DIAGNOSIS — M5440 Lumbago with sciatica, unspecified side: Secondary | ICD-10-CM

## 2021-12-18 DIAGNOSIS — M9908 Segmental and somatic dysfunction of rib cage: Secondary | ICD-10-CM | POA: Diagnosis not present

## 2021-12-18 DIAGNOSIS — M9905 Segmental and somatic dysfunction of pelvic region: Secondary | ICD-10-CM | POA: Diagnosis not present

## 2021-12-18 DIAGNOSIS — M25531 Pain in right wrist: Secondary | ICD-10-CM | POA: Diagnosis not present

## 2021-12-18 DIAGNOSIS — M25522 Pain in left elbow: Secondary | ICD-10-CM | POA: Diagnosis not present

## 2021-12-18 DIAGNOSIS — F3132 Bipolar disorder, current episode depressed, moderate: Secondary | ICD-10-CM | POA: Diagnosis not present

## 2021-12-18 DIAGNOSIS — F411 Generalized anxiety disorder: Secondary | ICD-10-CM | POA: Diagnosis not present

## 2021-12-18 DIAGNOSIS — M9906 Segmental and somatic dysfunction of lower extremity: Secondary | ICD-10-CM | POA: Diagnosis not present

## 2021-12-22 DIAGNOSIS — F3132 Bipolar disorder, current episode depressed, moderate: Secondary | ICD-10-CM | POA: Diagnosis not present

## 2021-12-22 DIAGNOSIS — F411 Generalized anxiety disorder: Secondary | ICD-10-CM | POA: Diagnosis not present

## 2021-12-24 ENCOUNTER — Ambulatory Visit (INDEPENDENT_AMBULATORY_CARE_PROVIDER_SITE_OTHER): Payer: Medicare Other | Admitting: Family Medicine

## 2021-12-24 VITALS — BP 124/70 | HR 78 | Temp 97.6°F | Resp 16 | Ht 61.0 in | Wt 117.2 lb

## 2021-12-24 DIAGNOSIS — W57XXXA Bitten or stung by nonvenomous insect and other nonvenomous arthropods, initial encounter: Secondary | ICD-10-CM

## 2021-12-24 DIAGNOSIS — L089 Local infection of the skin and subcutaneous tissue, unspecified: Secondary | ICD-10-CM

## 2021-12-24 DIAGNOSIS — S90862A Insect bite (nonvenomous), left foot, initial encounter: Secondary | ICD-10-CM | POA: Diagnosis not present

## 2021-12-24 DIAGNOSIS — N6452 Nipple discharge: Secondary | ICD-10-CM

## 2021-12-24 MED ORDER — CEPHALEXIN 500 MG PO CAPS: 500.0000 mg | ORAL_CAPSULE | Freq: Two times a day (BID) | ORAL | 0 refills | Status: AC

## 2021-12-24 NOTE — Progress Notes (Signed)
   Subjective:    Patient ID: Theresa Martin, female    DOB: 1955/07/20, 66 y.o.   MRN: [id number]  HPI Insect bites- pt reports they first appeared 2 weeks ago on L foot, just below the ankle.  She says they have become red, swollen, and hot.  + pus and drainage.  She feels that they are starting to scab and improve but are still itchy and tender.  Breast d/c- pt had diagnostic mammo and at that time, they were not able to express anything from the breasts.  She denies pain or swelling.  No skin changes.  States this has been going on 'for years'.  Wants to know next steps.   Review of Systems For ROS see HPI     Objective:   Physical Exam Vitals reviewed.  Constitutional:      General: She is not in acute distress.    Appearance: Normal appearance. She is not ill-appearing.  Skin:    General: Skin is warm and dry.     Findings: Erythema (cluster of unroofed, scabbing lesions on dorsum of L foot just below ankle w/ surrounding redness and TTP) present.  Neurological:     General: No focal deficit present.     Mental Status: She is alert and oriented to person, place, and time.  Psychiatric:        Mood and Affect: Mood normal.        Behavior: Behavior normal.        Thought Content: Thought content normal.           Assessment & Plan:  Infected insect bite L foot- new.  Will start Keflex for cellulitis.  Pt expressed understanding and is in agreement w/ plan.   Breast d/c- pt first told me about this in May.  Had diagnostic mammo that was unrevealing.  Today she reports this has been going on 'for years'.  Will get Prolactin level and try and get an MRI of her pituitary to assess for possible mass.  Pt expressed understanding and is in agreement w/ plan.

## 2021-12-24 NOTE — Patient Instructions (Signed)
Follow up as needed or as scheduled We'll notify you of your lab results and make any changes if needed We'll call you to schedule your MRI to assess the pituitary START the Cephalexin twice daily- take w/ food- for the infected bites Call with any questions or concerns Stay Safe!  Stay Healthy! Happy Early Rudene Anda!!!

## 2021-12-25 ENCOUNTER — Encounter: Payer: Self-pay | Admitting: Family Medicine

## 2021-12-25 LAB — PROLACTIN: Prolactin: 14.3 ng/mL

## 2021-12-25 NOTE — Progress Notes (Signed)
Left pt a VM stating lab results  

## 2021-12-28 ENCOUNTER — Ambulatory Visit: Payer: Medicare Other | Admitting: Family Medicine

## 2021-12-28 NOTE — Telephone Encounter (Signed)
Okay to wait on MRI given normal prolactin? Pt wants breast exam at Nov physical is this appropriate given previous sxs and concerns?

## 2022-01-05 DIAGNOSIS — F3132 Bipolar disorder, current episode depressed, moderate: Secondary | ICD-10-CM | POA: Diagnosis not present

## 2022-01-05 DIAGNOSIS — F411 Generalized anxiety disorder: Secondary | ICD-10-CM | POA: Diagnosis not present

## 2022-01-18 ENCOUNTER — Telehealth: Payer: Self-pay | Admitting: Internal Medicine
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
## 2022-01-25 ENCOUNTER — Other Ambulatory Visit: Payer: Self-pay | Admitting: Sports Medicine

## 2022-01-25 DIAGNOSIS — M9903 Segmental and somatic dysfunction of lumbar region: Secondary | ICD-10-CM | POA: Diagnosis not present

## 2022-01-25 DIAGNOSIS — M7918 Myalgia, other site: Secondary | ICD-10-CM | POA: Diagnosis not present

## 2022-01-25 DIAGNOSIS — M9901 Segmental and somatic dysfunction of cervical region: Secondary | ICD-10-CM | POA: Diagnosis not present

## 2022-01-25 DIAGNOSIS — M79601 Pain in right arm: Secondary | ICD-10-CM | POA: Diagnosis not present

## 2022-01-25 DIAGNOSIS — M9905 Segmental and somatic dysfunction of pelvic region: Secondary | ICD-10-CM | POA: Diagnosis not present

## 2022-01-25 DIAGNOSIS — M9902 Segmental and somatic dysfunction of thoracic region: Secondary | ICD-10-CM | POA: Diagnosis not present

## 2022-01-25 DIAGNOSIS — M545 Low back pain, unspecified: Secondary | ICD-10-CM | POA: Diagnosis not present

## 2022-01-25 DIAGNOSIS — M9904 Segmental and somatic dysfunction of sacral region: Secondary | ICD-10-CM | POA: Diagnosis not present

## 2022-01-25 DIAGNOSIS — M9908 Segmental and somatic dysfunction of rib cage: Secondary | ICD-10-CM | POA: Diagnosis not present

## 2022-01-25 DIAGNOSIS — M79602 Pain in left arm: Secondary | ICD-10-CM | POA: Diagnosis not present

## 2022-01-25 DIAGNOSIS — M9906 Segmental and somatic dysfunction of lower extremity: Secondary | ICD-10-CM | POA: Diagnosis not present

## 2022-01-25 DIAGNOSIS — M9907 Segmental and somatic dysfunction of upper extremity: Secondary | ICD-10-CM | POA: Diagnosis not present

## 2022-01-29 ENCOUNTER — Encounter: Payer: Self-pay | Admitting: Family Medicine

## 2022-01-29 ENCOUNTER — Ambulatory Visit: Payer: Medicare Other | Admitting: Family Medicine

## 2022-02-01 ENCOUNTER — Telehealth: Payer: Self-pay | Admitting: Internal Medicine

## 2022-02-01 DIAGNOSIS — F411 Generalized anxiety disorder: Secondary | ICD-10-CM | POA: Diagnosis not present

## 2022-02-01 DIAGNOSIS — F3132 Bipolar disorder, current episode depressed, moderate: Secondary | ICD-10-CM | POA: Diagnosis not present

## 2022-02-01 NOTE — Telephone Encounter (Signed)
Patient is requesting advisement on the RSV vaccine, or defer to pulm?

## 2022-02-01 NOTE — Telephone Encounter (Addendum)
Dr. Mortimer Fries, please advise if you recommend RSV for patient. Thanks

## 2022-02-02 ENCOUNTER — Ambulatory Visit: Payer: Medicare Other | Admitting: Family Medicine

## 2022-02-03 ENCOUNTER — Encounter: Payer: Self-pay | Admitting: Family Medicine

## 2022-02-03 ENCOUNTER — Ambulatory Visit (INDEPENDENT_AMBULATORY_CARE_PROVIDER_SITE_OTHER): Payer: Medicare Other | Admitting: Family Medicine

## 2022-02-03 VITALS — BP 118/60 | HR 79 | Temp 98.1°F | Resp 17 | Ht 61.0 in | Wt 118.0 lb

## 2022-02-03 DIAGNOSIS — I10 Essential (primary) hypertension: Secondary | ICD-10-CM | POA: Diagnosis not present

## 2022-02-03 DIAGNOSIS — R21 Rash and other nonspecific skin eruption: Secondary | ICD-10-CM

## 2022-02-03 DIAGNOSIS — E785 Hyperlipidemia, unspecified: Secondary | ICD-10-CM | POA: Diagnosis not present

## 2022-02-03 LAB — CBC WITH DIFFERENTIAL/PLATELET
Basophils Absolute: 0 10*3/uL (ref 0.0–0.1)
Basophils Relative: 0.9 % (ref 0.0–3.0)
Eosinophils Absolute: 0 10*3/uL (ref 0.0–0.7)
Eosinophils Relative: 1.1 % (ref 0.0–5.0)
HCT: 35.5 % — ABNORMAL LOW (ref 36.0–46.0)
Hemoglobin: 12 g/dL (ref 12.0–15.0)
Lymphocytes Relative: 35.4 % (ref 12.0–46.0)
Lymphs Abs: 1.6 10*3/uL (ref 0.7–4.0)
MCHC: 33.9 g/dL (ref 30.0–36.0)
MCV: 91.1 fl (ref 78.0–100.0)
Monocytes Absolute: 0.5 10*3/uL (ref 0.1–1.0)
Monocytes Relative: 10.3 % (ref 3.0–12.0)
Neutro Abs: 2.3 10*3/uL (ref 1.4–7.7)
Neutrophils Relative %: 52.3 % (ref 43.0–77.0)
Platelets: 221 10*3/uL (ref 150.0–400.0)
RBC: 3.89 Mil/uL (ref 3.87–5.11)
RDW: 12.3 % (ref 11.5–15.5)
WBC: 4.4 10*3/uL (ref 4.0–10.5)

## 2022-02-03 LAB — LIPID PANEL
Cholesterol: 147 mg/dL (ref 0–200)
HDL: 68.2 mg/dL (ref >39.00)
LDL Cholesterol: 70 mg/dL (ref 0–99)
NonHDL: 78.89
Total CHOL/HDL Ratio: 2
Triglycerides: 42 mg/dL (ref 0.0–149.0)
VLDL: 8.4 mg/dL (ref 0.0–40.0)

## 2022-02-03 LAB — BASIC METABOLIC PANEL
BUN: 15 mg/dL (ref 6–23)
CO2: 33 mEq/L — ABNORMAL HIGH (ref 19–32)
Calcium: 9.1 mg/dL (ref 8.4–10.5)
Chloride: 100 mEq/L (ref 96–112)
Creatinine, Ser: 0.66 mg/dL (ref 0.40–1.20)
GFR: 90.98 mL/min (ref >60.00)
Glucose, Bld: 93 mg/dL (ref 70–99)
Potassium: 3.9 mEq/L (ref 3.5–5.1)
Sodium: 136 mEq/L (ref 135–145)

## 2022-02-03 LAB — HEPATIC FUNCTION PANEL
ALT: 21 U/L (ref 0–35)
AST: 26 U/L (ref 0–37)
Albumin: 4.3 g/dL (ref 3.5–5.2)
Alkaline Phosphatase: 67 U/L (ref 39–117)
Bilirubin, Direct: 0.1 mg/dL (ref 0.0–0.3)
Total Bilirubin: 0.3 mg/dL (ref 0.2–1.2)
Total Protein: 7.2 g/dL (ref 6.0–8.3)

## 2022-02-03 LAB — TSH: TSH: 1.07 u[IU]/mL (ref 0.35–5.50)

## 2022-02-03 MED ORDER — PREDNISONE 10 MG PO TABS: ORAL_TABLET | ORAL | 0 refills | Status: DC
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
## 2022-02-03 NOTE — Telephone Encounter (Signed)
Patient is aware of below message and voiced her understanding.  Nothing further needed.   

## 2022-02-03 NOTE — Patient Instructions (Addendum)
Follow up in 6 months to recheck BP and cholesterol We'll notify you of your lab results and make any changes if needed START the Prednisone as directed- take w/ food Get your flu shot AFTER you finish the Prednisone Call  GI to schedule your repeat colonoscopy Call with any questions or concerns Hang in there!!!

## 2022-02-04 NOTE — Telephone Encounter (Signed)
Patient okay to take tumeric supplement?

## 2022-02-09 DIAGNOSIS — M9902 Segmental and somatic dysfunction of thoracic region: Secondary | ICD-10-CM | POA: Diagnosis not present

## 2022-02-09 DIAGNOSIS — M9901 Segmental and somatic dysfunction of cervical region: Secondary | ICD-10-CM | POA: Diagnosis not present

## 2022-02-09 DIAGNOSIS — M9908 Segmental and somatic dysfunction of rib cage: Secondary | ICD-10-CM | POA: Diagnosis not present

## 2022-02-09 DIAGNOSIS — M79602 Pain in left arm: Secondary | ICD-10-CM | POA: Diagnosis not present

## 2022-02-09 DIAGNOSIS — M9903 Segmental and somatic dysfunction of lumbar region: Secondary | ICD-10-CM | POA: Diagnosis not present

## 2022-02-09 DIAGNOSIS — M9904 Segmental and somatic dysfunction of sacral region: Secondary | ICD-10-CM | POA: Diagnosis not present

## 2022-02-09 DIAGNOSIS — M9905 Segmental and somatic dysfunction of pelvic region: Secondary | ICD-10-CM | POA: Diagnosis not present

## 2022-02-09 DIAGNOSIS — M25531 Pain in right wrist: Secondary | ICD-10-CM | POA: Diagnosis not present

## 2022-02-09 DIAGNOSIS — M79601 Pain in right arm: Secondary | ICD-10-CM | POA: Diagnosis not present

## 2022-02-09 DIAGNOSIS — M9907 Segmental and somatic dysfunction of upper extremity: Secondary | ICD-10-CM | POA: Diagnosis not present

## 2022-02-09 DIAGNOSIS — G4489 Other headache syndrome: Secondary | ICD-10-CM | POA: Diagnosis not present

## 2022-02-15 ENCOUNTER — Other Ambulatory Visit: Payer: Self-pay | Admitting: Diagnostic Neuroimaging

## 2022-02-16 ENCOUNTER — Telehealth: Payer: Self-pay | Admitting: Diagnostic Neuroimaging

## 2022-02-16 ENCOUNTER — Other Ambulatory Visit: Payer: Self-pay | Admitting: Diagnostic Neuroimaging

## 2022-02-16 MED ORDER — AJOVY 225 MG/1.5ML ~~LOC~~ SOSY: 225.0000 mg | PREFILLED_SYRINGE | SUBCUTANEOUS | 1 refills | Status: DC

## 2022-02-16 MED ORDER — AJOVY 225 MG/1.5ML ~~LOC~~ SOAJ: 225.0000 mg | SUBCUTANEOUS | 0 refills | Status: DC

## 2022-02-16 MED ORDER — AJOVY 225 MG/1.5ML ~~LOC~~ SOSY: 225.0000 mg | PREFILLED_SYRINGE | SUBCUTANEOUS | 2 refills | Status: DC

## 2022-02-16 NOTE — Telephone Encounter (Signed)
Theresa Martin is calling from [facility]. Stated he needs more clarification on a script that he just received for Fremanezumab-vfrm (AJOVY) 225 MG/1.5ML SOAJ

## 2022-02-16 NOTE — Telephone Encounter (Signed)
I called [facility]. I spoke with Darnelle Maffucci. Confirmed RX for Ajovy AJ '225mg'$  SQ q 30days.

## 2022-02-16 NOTE — Telephone Encounter (Signed)
Pt said the pharmacy refill for Fremanezumab-vfrm (AJOVY) 225 MG/1.5ML SOSY was sent to has on back order.   Can refill to: [facility] [address] [place], [place] [address] Phone: [phone]

## 2022-02-16 NOTE — Telephone Encounter (Signed)
I called patient.  I advised her that [facility] [place] does not have the prefilled Ajovy syringes.  They have the Ajovy autoinjectors.  I explained that the delivery method for any medication is slightly different.  However, it is the same medication.  The pharmacy can help her with the injection training if needed.  Patient is agreeable to this.  The Ajovy Rx for autoinjectors [facility] [place].

## 2022-02-16 NOTE — Telephone Encounter (Signed)
[facility] [place].  I called patient.  I advised her of this.  Patient is requesting a 91-monthsupply.  I have resent the prescription reflecting that they may dispense 361-monthupply.

## 2022-02-17 ENCOUNTER — Other Ambulatory Visit: Payer: Self-pay | Admitting: Diagnostic Neuroimaging

## 2022-02-19 ENCOUNTER — Encounter: Payer: Self-pay | Admitting: Diagnostic Neuroimaging

## 2022-02-22 DIAGNOSIS — F3132 Bipolar disorder, current episode depressed, moderate: Secondary | ICD-10-CM | POA: Diagnosis not present

## 2022-02-22 DIAGNOSIS — F411 Generalized anxiety disorder: Secondary | ICD-10-CM | POA: Diagnosis not present

## 2022-02-22 MED ORDER — RIZATRIPTAN BENZOATE 10 MG PO TABS: 10.0000 mg | ORAL_TABLET | ORAL | 2 refills | Status: DC | PRN

## 2022-02-22 NOTE — Telephone Encounter (Signed)
Already sent to pharmacy 

## 2022-03-02 DIAGNOSIS — M25551 Pain in right hip: Secondary | ICD-10-CM | POA: Diagnosis not present

## 2022-03-02 DIAGNOSIS — M25451 Effusion, right hip: Secondary | ICD-10-CM | POA: Diagnosis not present

## 2022-03-03 DIAGNOSIS — M5451 Vertebrogenic low back pain: Secondary | ICD-10-CM | POA: Diagnosis not present

## 2022-03-03 DIAGNOSIS — S39012S Strain of muscle, fascia and tendon of lower back, sequela: Secondary | ICD-10-CM | POA: Diagnosis not present

## 2022-03-04 ENCOUNTER — Ambulatory Visit (INDEPENDENT_AMBULATORY_CARE_PROVIDER_SITE_OTHER): Payer: Medicare Other | Admitting: Family Medicine

## 2022-03-04 ENCOUNTER — Encounter: Payer: Self-pay | Admitting: Family Medicine

## 2022-03-04 VITALS — BP 126/70 | HR 74 | Temp 98.4°F | Resp 17 | Ht 61.0 in | Wt 119.1 lb

## 2022-03-04 DIAGNOSIS — R140 Abdominal distension (gaseous): Secondary | ICD-10-CM

## 2022-03-04 NOTE — Progress Notes (Signed)
   Subjective:    Patient ID: ZARINA PE, female    DOB: 10-18-1955, 66 y.o.   MRN: [id number]  HPI Abd pain- pt reports stomach feels tight and distended.  Pt reports this has been going on 'awhile' but worsening recently.  No N/V.  Yesterday had a good BM but does not go daily.  Will go more like every 3 days.  'sometimes it's just a ball or 2'.  Pt has hx of severe constipation but at the time she was unaware.  Pt was scheduled for colonoscopy in June but had to cancel due to incomplete prep (Dr Jonathon Bellows, [facility]).     Review of Systems For ROS see HPI     Objective:   Physical Exam Vitals reviewed.  Constitutional:      General: She is not in acute distress.    Appearance: She is well-developed. She is not ill-appearing.  Abdominal:     General: Bowel sounds are normal. There is distension.     Palpations: There is no fluid wave.     Tenderness: There is no abdominal tenderness. There is no guarding or rebound. Negative signs include Murphy's sign and McBurney's sign.  Skin:    General: Skin is warm and dry.  Neurological:     General: No focal deficit present.     Mental Status: She is alert and oriented to person, place, and time.  Psychiatric:        Mood and Affect: Mood normal. Mood is not anxious.        Behavior: Behavior normal.           Assessment & Plan:  Abdominal distension- new to provider, recurrent problem for pt.  Pt reports the last time this happened, she had imaging done that showed she was 'completely full' and she didn't realize it.  She admits that she will only have BM's q3 days.  At times they will be complete movements but other times it will be small, hard balls of stool.  Suspect that her current distension is due to chronic constipation.  Will get KUB to assess, start daily Miralax.  Encouraged increased water and fiber intake.  Will adjust plan based on results of xray.  Pt expressed understanding and is in agreement w/ plan.

## 2022-03-04 NOTE — Patient Instructions (Addendum)
Go to the imaging center off [address] to get your xray We'll notify you of your lab results and make any changes if needed START daily Miralax (store brand is fine) to keep things moving Continue to drink LOTS of water Call with any questions or concerns Hang in there!!!

## 2022-03-05 ENCOUNTER — Telehealth: Payer: Self-pay | Admitting: Physical Medicine and Rehabilitation

## 2022-03-05 ENCOUNTER — Encounter: Payer: Medicare Other | Admitting: Physical Medicine and Rehabilitation

## 2022-03-05 NOTE — Telephone Encounter (Signed)
Pt returned call to [place]. Please call pt at [phone].

## 2022-03-05 NOTE — Telephone Encounter (Signed)
Patient returned call needing to schedule an appointment with Dr. Carey Bullocks for her right arm. The number to contact patient is [phone]

## 2022-03-08 ENCOUNTER — Telehealth: Payer: Self-pay | Admitting: Physical Medicine and Rehabilitation

## 2022-03-08 ENCOUNTER — Telehealth: Payer: Self-pay

## 2022-03-08 LAB — CELIAC DISEASE PANEL
(tTG) Ab, IgA: <1 U/mL
(tTG) Ab, IgG: <1 U/mL
Gliadin IgA: <1 U/mL
Gliadin IgG: <1 U/mL
Immunoglobulin A: 159 mg/dL (ref 70–320)

## 2022-03-08 NOTE — Telephone Encounter (Signed)
Spoke with patient and rescheduled for 03/09/22

## 2022-03-08 NOTE — Telephone Encounter (Signed)
See previous encounter

## 2022-03-08 NOTE — Telephone Encounter (Signed)
Pt seen results via my chart  

## 2022-03-08 NOTE — Telephone Encounter (Signed)
-----   Message from [name], MD sent at 03/08/2022 12:45 PM EST ----- No evidence of celiac.  This is good news!

## 2022-03-09 ENCOUNTER — Ambulatory Visit (INDEPENDENT_AMBULATORY_CARE_PROVIDER_SITE_OTHER): Payer: Medicare Other | Admitting: Physical Medicine and Rehabilitation

## 2022-03-09 DIAGNOSIS — M79601 Pain in right arm: Secondary | ICD-10-CM | POA: Diagnosis not present

## 2022-03-09 DIAGNOSIS — R202 Paresthesia of skin: Secondary | ICD-10-CM

## 2022-03-09 DIAGNOSIS — R531 Weakness: Secondary | ICD-10-CM | POA: Diagnosis not present

## 2022-03-09 NOTE — Progress Notes (Signed)
Numeric Pain Rating Scale and Functional Assessment Average Pain 5   In the last MONTH (on 0-10 scale) has pain interfered with the following?  1. General activity like being  able to carry out your everyday physical activities such as walking, climbing stairs, carrying groceries, or moving a chair?  Rating(10)   Right handed. Pain in right arm mainly in the forearm, but can go into upper arm. Has weakness when trying to grasp things

## 2022-03-10 ENCOUNTER — Encounter: Payer: Self-pay | Admitting: Family Medicine

## 2022-03-10 ENCOUNTER — Ambulatory Visit (INDEPENDENT_AMBULATORY_CARE_PROVIDER_SITE_OTHER): Payer: Medicare Other | Admitting: Family Medicine

## 2022-03-10 VITALS — BP 122/60 | HR 86 | Temp 98.8°F | Resp 16 | Ht 61.0 in | Wt 116.1 lb

## 2022-03-10 DIAGNOSIS — H66002 Acute suppurative otitis media without spontaneous rupture of ear drum, left ear: Secondary | ICD-10-CM | POA: Diagnosis not present

## 2022-03-10 LAB — POCT RAPID STREP A (OFFICE): Rapid Strep A Screen: NEGATIVE

## 2022-03-10 MED ORDER — AMOXICILLIN 875 MG PO TABS: 875.0000 mg | ORAL_TABLET | Freq: Two times a day (BID) | ORAL | 0 refills | Status: AC

## 2022-03-10 NOTE — Progress Notes (Signed)
   Subjective:    Patient ID: Theresa Martin, female    DOB: February 14, 1956, 65 y.o.   MRN: [id number]  HPI Sore throat- sxs started 5 days ago.  'i feel terrible'.  Pt reports chest is tight, productive cough, sinus drainage (green).  Tm 99.3 this AM.  + HA.  Bilateral ear fullness.  + body aches.  No known sick contacts.   Review of Systems For ROS see HPI     Objective:   Physical Exam Vitals reviewed.  Constitutional:      General: She is not in acute distress.    Appearance: She is well-developed. She is not ill-appearing.  HENT:     Head: Normocephalic and atraumatic.     Right Ear: Tympanic membrane and ear canal normal.     Left Ear: Ear canal normal. A middle ear effusion is present. Tympanic membrane is erythematous.     Nose: Congestion present. No rhinorrhea.     Mouth/Throat:     Mouth: Mucous membranes are moist.     Pharynx: Posterior oropharyngeal erythema (copious post nasal drip) present. No pharyngeal swelling or oropharyngeal exudate.     Tonsils: No tonsillar exudate or tonsillar abscesses.  Eyes:     Conjunctiva/sclera: Conjunctivae normal.  Cardiovascular:     Rate and Rhythm: Normal rate and regular rhythm.  Pulmonary:     Effort: Pulmonary effort is normal. No respiratory distress.     Breath sounds: Normal breath sounds. No wheezing.  Musculoskeletal:     Cervical back: Neck supple.  Lymphadenopathy:     Cervical: Cervical adenopathy present.  Skin:    General: Skin is warm and dry.  Neurological:     General: No focal deficit present.     Mental Status: She is alert and oriented to person, place, and time.  Psychiatric:        Mood and Affect: Mood normal.        Behavior: Behavior normal.           Assessment & Plan:  L OM- new.  Pt w/ red, bulging ear drum.  Start Amoxicillin '875mg'$  BID.  Reviewed supportive care and red flags that should prompt return.  Pt expressed understanding and is in agreement w/ plan.

## 2022-03-10 NOTE — Patient Instructions (Signed)
Follow up as needed or as scheduled Start the Amoxicillin twice daily- take w/ food Drink LOTS of fluids Alternate Tylenol and ibuprofen as needed for pain/fever Robitussin or Delsym as needed for cough REST!!! Call with any questions or concerns Hang in there!!! Happy Holidays!!!

## 2022-03-12 ENCOUNTER — Other Ambulatory Visit: Payer: Self-pay | Admitting: Family Medicine

## 2022-03-12 DIAGNOSIS — E785 Hyperlipidemia, unspecified: Secondary | ICD-10-CM

## 2022-03-15 NOTE — Progress Notes (Signed)
Theresa Martin - 66 y.o. female MRN [id number]  Date of birth: 1955/06/27  Office Visit Note: Visit Date: 03/09/2022 PCP: [name], MD Referred by: Gerda Diss, DO  Subjective: Chief Complaint  Patient presents with   Right Arm - Numbness, Pain, Weakness   HPI: Theresa Martin is a 66 y.o. female who comes in today at the request of Dr. Teresa Coombs for evaluation and management of the Right upper extremities.  Patient is Right hand dominant.  She reports severe at times intermittently worsening but chronic right upper extremity pain including pain in the right shoulder with referral into the hand.  She gets a lot of pain with grasping objects.  Occasional paresthesia.  History of trigger finger.  She reports increased pain after moving objects in her house.  No specific injury.  Has had physical therapy with some help recently.  Her case is complicated by history of fibromyalgia.  Dr. Paulla Fore felt like she was having myofascial pain increases after this incident.  She has had prior electrodiagnostic study on the left by Dr. Leta Baptist whom she continues to see for migraine headaches.  His electrodiagnostic study is reviewed below.  This was normal on the left.  Because of the associated neck pain cervical MRI has been completed as well.  I did review this with the images and report.  Report is below.  Essentially normal MRI of the cervical spine for her age.  No focal nerve compression.  She rates her pain as a 5 out of 10 but on a functional scale 10 out of 10 in terms of not being able to do things with her right arm.      Review of Systems  Musculoskeletal:  Positive for back pain, joint pain and neck pain.  Neurological:  Positive for tingling and focal weakness.  All other systems reviewed and are negative.  Otherwise per HPI.  Assessment & Plan: Visit Diagnoses:    ICD-10-CM   1. Paresthesia of skin  R20.2 NCV with EMG (electromyography)    2. Weakness   R53.1     3. Right arm pain  M79.601        Plan: Impression: Clinical impression is likely musculoskeletal pain and myofascial pain with the possibility of cervical radiculitis or radial tunnel syndrome.  Less likely median nerve neuropathy.  I did complete electrodiagnostic study.  Essentially NORMAL electrodiagnostic study of the right upper limb.  There is no significant electrodiagnostic evidence of nerve entrapment, brachial plexopathy or cervical radiculopathy.  This particular type of test is not sensitive for radial tunnel syndrome.  As you know, purely sensory or demyelinating radiculopathies and chemical radiculitis may not be detected with this particular electrodiagnostic study. **This electrodiagnostic study cannot rule out small fiber polyneuropathy and dysesthesias from central pain syndromes such as stroke or central pain sensitization syndromes such as fibromyalgia.  Myotomal referral pain from trigger points is also not excluded.  Recommendations: 1.  Follow-up with referring physician. 2.  Continue current management of symptoms.  Pain is likely musculoskeletal in nature.  Recommend physical therapy with home modalities and medication management.  Meds & Orders: No orders of the defined types were placed in this encounter.   Orders Placed This Encounter  Procedures   NCV with EMG (electromyography)    Follow-up: Return in about 2 weeks (around 03/23/2022) for Teresa Coombs, DO.   Procedures: No procedures performed  EMG & NCV Findings: All nerve conduction studies (as indicated in the  following tables) were within normal limits.    All examined muscles (as indicated in the following table) showed no evidence of electrical instability.    Impression: Essentially NORMAL electrodiagnostic study of the right upper limb.  There is no significant electrodiagnostic evidence of nerve entrapment, brachial plexopathy or cervical radiculopathy.    As you know, purely sensory  or demyelinating radiculopathies and chemical radiculitis may not be detected with this particular electrodiagnostic study. **This electrodiagnostic study cannot rule out small fiber polyneuropathy and dysesthesias from central pain syndromes such as stroke or central pain sensitization syndromes such as fibromyalgia.  Myotomal referral pain from trigger points is also not excluded.  Recommendations: 1.  Follow-up with referring physician. 2.  Continue current management of symptoms.  Pain is likely musculoskeletal in nature.  Recommend physical therapy with home modalities and medication management.  ___________________________ Laurence Spates [facility] Board Certified, American Board of Physical Medicine and Rehabilitation    Nerve Conduction Studies Anti Sensory Summary Table   Stim Site NR Peak (ms) Norm Peak (ms) P-T Amp (V) Norm P-T Amp Site1 Site2 Delta-P (ms) Dist (cm) Vel (m/s) Norm Vel (m/s)  Right Median Acr Palm Anti Sensory (2nd Digit)  31.6C  Wrist    3.4 <3.6 43.0 >10 Wrist Palm 1.6 0.0    Palm    1.8 <2.0 7.4         Right Radial Anti Sensory (Base 1st Digit)  30.9C  Wrist    2.1 <3.1 32.5  Wrist Base 1st Digit 2.1 0.0    Right Ulnar Anti Sensory (5th Digit)  31.7C  Wrist    3.2 <3.7 24.3 >15.0 Wrist 5th Digit 3.2 14.0 44 >38   Motor Summary Table   Stim Site NR Onset (ms) Norm Onset (ms) O-P Amp (mV) Norm O-P Amp Site1 Site2 Delta-0 (ms) Dist (cm) Vel (m/s) Norm Vel (m/s)  Right Median Motor (Abd Poll Brev)  30.9C  Wrist    3.8 <4.2 7.6 >5 Elbow Wrist 3.9 19.5 50 >50  Elbow    7.7  6.9         Right Ulnar Motor (Abd Dig Min)  31.3C  Wrist    3.0 <4.2 9.6 >3 B Elbow Wrist 3.1 18.5 60 >53  B Elbow    6.1  8.8  A Elbow B Elbow 1.0 10.0 100 >53  A Elbow    7.1  8.7          EMG   Side Muscle Nerve Root Ins Act Fibs Psw Amp Dur Poly Recrt Int Fraser Din Comment  Right 1stDorInt Ulnar C8-T1 Nml Nml Nml Nml Nml 0 Nml Nml   Right Abd Poll Brev Median C8-T1 Nml Nml Nml Nml Nml  0 Nml Nml   Right ExtDigCom   Nml Nml Nml Nml Nml 0 Nml Nml   Right Triceps Radial C6-7-8 Nml Nml Nml Nml Nml 0 Nml Nml   Right Deltoid Axillary C5-6 Nml Nml Nml Nml Nml 0 Nml Nml     Nerve Conduction Studies Anti Sensory Left/Right Comparison   Stim Site L Lat (ms) R Lat (ms) L-R Lat (ms) L Amp (V) R Amp (V) L-R Amp (%) Site1 Site2 L Vel (m/s) R Vel (m/s) L-R Vel (m/s)  Median Acr Palm Anti Sensory (2nd Digit)  31.6C  Wrist  3.4   43.0  Wrist Palm     Palm  1.8   7.4        Radial Anti Sensory (Base 1st Digit)  30.[place]  Wrist  2.1   32.5  Wrist Base 1st Digit     Ulnar Anti Sensory (5th Digit)  31.7C  Wrist  3.2   24.3  Wrist 5th Digit  44    Motor Left/Right Comparison   Stim Site L Lat (ms) R Lat (ms) L-R Lat (ms) L Amp (mV) R Amp (mV) L-R Amp (%) Site1 Site2 L Vel (m/s) R Vel (m/s) L-R Vel (m/s)  Median Motor (Abd Poll Brev)  30.9C  Wrist  3.8   7.6  Elbow Wrist  50   Elbow  7.7   6.9        Ulnar Motor (Abd Dig Min)  31.3C  Wrist  3.0   9.6  B Elbow Wrist  60   B Elbow  6.1   8.8  A Elbow B Elbow  100   A Elbow  7.1   8.7           Waveforms:             Clinical History: Cervical Spine MRI IMPRESSION: Mild degenerative changes without spinal canal stenosis or neural foraminal narrowing.     Electronically Signed   By: Merilyn Baba M.D.   On: 11/15/2021 02:56 --------  NCS (NERVE CONDUCTION STUDY) WITH EMG (ELECTROMYOGRAPHY) REPORT     STUDY DATE: 01/29/21 PATIENT NAME: Theresa Martin DOB: [birth year] MRN: [id number]   ORDERING CLINICIAN: Andrey Spearman, MD    TECHNOLOGIST: Sherre Scarlet ELECTROMYOGRAPHER: Earlean Polka. Penumalli, MD FINDINGS: NERVE CONDUCTION STUDY:   Left median, left ulnar, left peroneal and left tibial motor responses are normal.   Left median, left ulnar, left sural and left superficial peroneal sensory responses are normal.   Left tibial and left ulnar F wave latencies are normal.     NEEDLE ELECTROMYOGRAPHY:   Needle  examination of left upper extremity deltoid, biceps, triceps, flexor carpi radialis and first dorsal interosseous are normal.     IMPRESSION:    Normal study.  No electrodiagnostic evidence of large fiber neuropathy or myopathy at this time.     INTERPRETING PHYSICIAN:  Penni Bombard, MD Certified in Neurology, Neurophysiology and Neuroimaging   [facility]   She reports that she has never smoked. She has never used smokeless tobacco. No results for input(s): "HGBA1C", "LABURIC" in the last 8760 hours.  Objective:  VS:  HT:    WT:   BMI:     BP:   HR: bpm  TEMP: ( )  RESP:  Physical Exam Vitals and nursing note reviewed.  Constitutional:      General: She is not in acute distress.    Appearance: Normal appearance. She is well-developed. She is not ill-appearing.  HENT:     Head: Normocephalic and atraumatic.  Eyes:     Conjunctiva/sclera: Conjunctivae normal.     Pupils: Pupils are equal, round, and reactive to light.  Cardiovascular:     Rate and Rhythm: Normal rate.     Pulses: Normal pulses.  Pulmonary:     Effort: Pulmonary effort is normal.  Musculoskeletal:        General: No swelling or deformity.     Cervical back: Neck supple. Tenderness present.     Right lower leg: No edema.     Left lower leg: No edema.     Comments: Inspection reveals no atrophy of the bilateral APB or FDI or hand intrinsics. There is no swelling, color changes, allodynia or dystrophic changes. There is 5 out of  5 strength in the bilateral wrist extension, finger abduction and long finger flexion. There is intact sensation to light touch in all dermatomal and peripheral nerve distributions. There is a negative Hoffmann's test bilaterally.  She did have some pain to palpation over the extensor wad on the right.  Some pain with resisted supination.  Skin:    General: Skin is warm and dry.     Findings: No erythema or rash.  Neurological:     General: No focal deficit  present.     Mental Status: She is alert and oriented to person, place, and time.     Sensory: No sensory deficit.     Motor: No weakness or abnormal muscle tone.     Coordination: Coordination normal.     Gait: Gait normal.  Psychiatric:        Mood and Affect: Mood normal.        Behavior: Behavior normal.     Ortho Exam  Imaging: No results found.  Past Medical/Family/Surgical/Social History: Medications & Allergies reviewed per EMR, new medications updated. Patient Active Problem List   Diagnosis Date Noted   GERD (gastroesophageal reflux disease) 02/28/2019   Schizo affective schizophrenia ([place]) 02/01/2019   Abnormal chest CT 12/11/2018   Pneumonia 09/29/2018   Nonallopathic lesion of thoracic region 09/05/2018   Nonallopathic lesion of rib cage 09/05/2018   Nonallopathic lesion of cervical region 09/05/2018   Nonallopathic lesion of lumbosacral region 09/05/2018   Nonallopathic lesion of sacral region 09/05/2018   Skin cancer    Chronic right shoulder pain 03/30/2017   Right shoulder pain 01/12/2017   Tendinopathy of rotator cuff 12/20/2016   Angle-closure glaucoma, severe stage 08/19/2015   Osteopenia 06/19/2015   Migraine without aura and without status migrainosus, not intractable 01/13/2015   High risk medications (not anticoagulants) long-term use 09/26/2014   Routine general medical examination at a health care facility 07/27/2012   Hyperlipidemia 09/29/2010   URETEROPELVIC JUNCTION OBSTRUCTION, CONGENITAL 12/15/2009   CARDIAC MURMUR 11/26/2008   MAGNETIC RESONANCE IMAGING, BRAIN, ABNORMAL 10/29/2008   VENEREAL WART 10/23/2008   Major depressive disorder, recurrent episode ([place]) 10/23/2008   Essential hypertension 10/23/2008   Allergic rhinitis 10/23/2008   Asthma 10/23/2008   RENAL CALCULUS, HX OF 10/23/2008   Past Medical History:  Diagnosis Date   Allergy    Anxiety    Asthma    Depression    Hyperlipidemia    Hypertension    Migraines     Osteopenia after menopause    Persistent headaches    Pneumonia 2020   Postherpetic neuralgia    Renal disorder    Shingles outbreak 02/2014   Skin cancer    moles on right groin and right buttock.   Family History  Problem Relation Age of Onset   Hypertension Mother    Coronary artery disease Mother    Heart failure Mother    Transient ischemic attack Mother    Hypertension Father    Liver disease Sister        liver failure   Migraines Sister    Arrhythmia Brother    Hypertension Brother    Cancer Neg Hx        colon    Breast cancer Neg Hx    Past Surgical History:  Procedure Laterality Date   CARDIOVASCULAR STRESS TEST  02/2009   treadmill stress test: Low risk   [place]   COLONOSCOPY     2014/2015 [place], normal   COLONOSCOPY  WITH PROPOFOL N/A 10/20/2021   Procedure: COLONOSCOPY WITH PROPOFOL;  Surgeon: Jonathon Bellows, MD;  Location: [facility] ENDOSCOPY;  Service: Gastroenterology;  Laterality: N/A;   DIAGNOSTIC LAPAROSCOPY     EXPLORATORY LAPAROTOMY     laproscopy for infertility   EYE SURGERY Bilateral    laser-correct opening b/tn cornea and iris   MOLE REMOVAL  2017   x 2 moles   Social History   Occupational History   Occupation: Control and instrumentation engineer   Occupation: retired  Tobacco Use   Smoking status: Never   Smokeless tobacco: Never  Scientific laboratory technician Use: Never used  Substance and Sexual Activity   Alcohol use: No    Alcohol/week: 0.0 standard drinks of alcohol   Drug use: No   Sexual activity: Not Currently

## 2022-03-15 NOTE — Procedures (Signed)
EMG & NCV Findings: All nerve conduction studies (as indicated in the following tables) were within normal limits.    All examined muscles (as indicated in the following table) showed no evidence of electrical instability.    Impression: Essentially NORMAL electrodiagnostic study of the right upper limb.  There is no significant electrodiagnostic evidence of nerve entrapment, brachial plexopathy or cervical radiculopathy.    As you know, purely sensory or demyelinating radiculopathies and chemical radiculitis may not be detected with this particular electrodiagnostic study. **This electrodiagnostic study cannot rule out small fiber polyneuropathy and dysesthesias from central pain syndromes such as stroke or central pain sensitization syndromes such as fibromyalgia.  Myotomal referral pain from trigger points is also not excluded.  Recommendations: 1.  Follow-up with referring physician. 2.  Continue current management of symptoms.  Pain is likely musculoskeletal in nature.  Recommend physical therapy with home modalities and medication management.  ___________________________ Laurence Spates [facility] Board Certified, American Board of Physical Medicine and Rehabilitation    Nerve Conduction Studies Anti Sensory Summary Table   Stim Site NR Peak (ms) Norm Peak (ms) P-T Amp (V) Norm P-T Amp Site1 Site2 Delta-P (ms) Dist (cm) Vel (m/s) Norm Vel (m/s)  Right Median Acr Palm Anti Sensory (2nd Digit)  31.6C  Wrist    3.4 <3.6 43.0 >10 Wrist Palm 1.6 0.0    Palm    1.8 <2.0 7.4         Right Radial Anti Sensory (Base 1st Digit)  30.9C  Wrist    2.1 <3.1 32.5  Wrist Base 1st Digit 2.1 0.0    Right Ulnar Anti Sensory (5th Digit)  31.7C  Wrist    3.2 <3.7 24.3 >15.0 Wrist 5th Digit 3.2 14.0 44 >38   Motor Summary Table   Stim Site NR Onset (ms) Norm Onset (ms) O-P Amp (mV) Norm O-P Amp Site1 Site2 Delta-0 (ms) Dist (cm) Vel (m/s) Norm Vel (m/s)  Right Median Motor (Abd Poll Brev)  30.9C   Wrist    3.8 <4.2 7.6 >5 Elbow Wrist 3.9 19.5 50 >50  Elbow    7.7  6.9         Right Ulnar Motor (Abd Dig Min)  31.3C  Wrist    3.0 <4.2 9.6 >3 B Elbow Wrist 3.1 18.5 60 >53  B Elbow    6.1  8.8  A Elbow B Elbow 1.0 10.0 100 >53  A Elbow    7.1  8.7          EMG   Side Muscle Nerve Root Ins Act Fibs Psw Amp Dur Poly Recrt Int Fraser Din Comment  Right 1stDorInt Ulnar C8-T1 Nml Nml Nml Nml Nml 0 Nml Nml   Right Abd Poll Brev Median C8-T1 Nml Nml Nml Nml Nml 0 Nml Nml   Right ExtDigCom   Nml Nml Nml Nml Nml 0 Nml Nml   Right Triceps Radial C6-7-8 Nml Nml Nml Nml Nml 0 Nml Nml   Right Deltoid Axillary C5-6 Nml Nml Nml Nml Nml 0 Nml Nml     Nerve Conduction Studies Anti Sensory Left/Right Comparison   Stim Site L Lat (ms) R Lat (ms) L-R Lat (ms) L Amp (V) R Amp (V) L-R Amp (%) Site1 Site2 L Vel (m/s) R Vel (m/s) L-R Vel (m/s)  Median Acr Palm Anti Sensory (2nd Digit)  31.6C  Wrist  3.4   43.0  Wrist Palm     Palm  1.8   7.4  Radial Anti Sensory (Base 1st Digit)  30.9C  Wrist  2.1   32.5  Wrist Base 1st Digit     Ulnar Anti Sensory (5th Digit)  31.7C  Wrist  3.2   24.3  Wrist 5th Digit  44    Motor Left/Right Comparison   Stim Site L Lat (ms) R Lat (ms) L-R Lat (ms) L Amp (mV) R Amp (mV) L-R Amp (%) Site1 Site2 L Vel (m/s) R Vel (m/s) L-R Vel (m/s)  Median Motor (Abd Poll Brev)  30.9C  Wrist  3.8   7.6  Elbow Wrist  50   Elbow  7.7   6.9        Ulnar Motor (Abd Dig Min)  31.3C  Wrist  3.0   9.6  B Elbow Wrist  60   B Elbow  6.1   8.8  A Elbow B Elbow  100   A Elbow  7.1   8.7           Waveforms:

## 2022-03-17 DIAGNOSIS — S39012S Strain of muscle, fascia and tendon of lower back, sequela: Secondary | ICD-10-CM | POA: Diagnosis not present

## 2022-03-17 DIAGNOSIS — M5451 Vertebrogenic low back pain: Secondary | ICD-10-CM | POA: Diagnosis not present

## 2022-03-18 ENCOUNTER — Other Ambulatory Visit: Payer: Self-pay | Admitting: Family Medicine

## 2022-03-18 DIAGNOSIS — M25552 Pain in left hip: Secondary | ICD-10-CM | POA: Diagnosis not present

## 2022-03-18 DIAGNOSIS — M25511 Pain in right shoulder: Secondary | ICD-10-CM | POA: Diagnosis not present

## 2022-03-18 DIAGNOSIS — M9904 Segmental and somatic dysfunction of sacral region: Secondary | ICD-10-CM | POA: Diagnosis not present

## 2022-03-18 DIAGNOSIS — M25551 Pain in right hip: Secondary | ICD-10-CM | POA: Diagnosis not present

## 2022-03-18 DIAGNOSIS — M9908 Segmental and somatic dysfunction of rib cage: Secondary | ICD-10-CM | POA: Diagnosis not present

## 2022-03-18 DIAGNOSIS — M9906 Segmental and somatic dysfunction of lower extremity: Secondary | ICD-10-CM | POA: Diagnosis not present

## 2022-03-18 DIAGNOSIS — M9907 Segmental and somatic dysfunction of upper extremity: Secondary | ICD-10-CM | POA: Diagnosis not present

## 2022-03-18 DIAGNOSIS — M9903 Segmental and somatic dysfunction of lumbar region: Secondary | ICD-10-CM | POA: Diagnosis not present

## 2022-03-18 DIAGNOSIS — M9905 Segmental and somatic dysfunction of pelvic region: Secondary | ICD-10-CM | POA: Diagnosis not present

## 2022-03-18 DIAGNOSIS — M9901 Segmental and somatic dysfunction of cervical region: Secondary | ICD-10-CM | POA: Diagnosis not present

## 2022-03-24 DIAGNOSIS — M5451 Vertebrogenic low back pain: Secondary | ICD-10-CM | POA: Diagnosis not present

## 2022-03-24 DIAGNOSIS — S39012S Strain of muscle, fascia and tendon of lower back, sequela: Secondary | ICD-10-CM | POA: Diagnosis not present

## 2022-03-26 ENCOUNTER — Other Ambulatory Visit: Payer: Self-pay | Admitting: Sports Medicine

## 2022-03-26 ENCOUNTER — Encounter: Payer: Self-pay | Admitting: Family Medicine

## 2022-03-26 ENCOUNTER — Ambulatory Visit (INDEPENDENT_AMBULATORY_CARE_PROVIDER_SITE_OTHER): Payer: Medicare Other | Admitting: Family Medicine

## 2022-03-26 VITALS — BP 130/76 | HR 76 | Temp 97.6°F | Ht 61.0 in | Wt 119.6 lb

## 2022-03-26 DIAGNOSIS — H938X3 Other specified disorders of ear, bilateral: Secondary | ICD-10-CM

## 2022-03-26 DIAGNOSIS — H6123 Impacted cerumen, bilateral: Secondary | ICD-10-CM | POA: Diagnosis not present

## 2022-03-26 DIAGNOSIS — Z23 Encounter for immunization: Secondary | ICD-10-CM

## 2022-03-26 DIAGNOSIS — M25521 Pain in right elbow: Secondary | ICD-10-CM

## 2022-03-26 NOTE — Patient Instructions (Signed)
Left ear looks much better today, I do not see any sign of infection.  Minimal cerumen in the canal/earwax but that was removed and not the cause of the ear symptoms.  You could have some eustachian tube dysfunction which may cause some fluid in the middle ear, but again it does not appear infected today.  Try Flonase nasal spray over-the-counter as we discussed using the technique we discussed.  Afrin nasal spray is also an option up to 3 days only.  I do recommend follow-up with ENT if persistent fluid feeling in the ears, be seen if any new or worsening symptoms.  Flu vaccine given today, there is no minimum waiting period between that and RSV vaccine, you can have that at your pharmacy when convenient.  Thank you for coming in today and let me know if there are questions.

## 2022-03-26 NOTE — Progress Notes (Signed)
Subjective:  Patient ID: Theresa Martin, female    DOB: 24-Aug-1955  Age: 66 y.o. MRN: [id number]  CC:  Chief Complaint  Patient presents with   Ear Fullness    Pt states after she finished her antibiotic both ears feel full, she states she had an infection in the left ear    HPI Theresa Martin presents for   Ear fullness She was seen December 13 by Dr. Birdie Riddle, diagnosed with left otitis media, with red bulging eardrum at that time.  Effusion was present in the left ear.  Right ear exam was normal.  Started on amoxicillin 875 mg twice daily. Completed course of amoxicillin.  Everything improved, much better, except continued congestion feeling in both ears.  Feels like fluid in ears, muffled. Some soreness at times.  No bleeding or d/c from canal.  No fever. Feels much better overall.  Has seen ENT in past, called but 4 weeks out for appt.  Saline ns few times per day, no steroid ns.    History Patient Active Problem List   Diagnosis Date Noted   GERD (gastroesophageal reflux disease) 02/28/2019   Schizo affective schizophrenia ([place]) 02/01/2019   Abnormal chest CT 12/11/2018   Pneumonia 09/29/2018   Nonallopathic lesion of thoracic region 09/05/2018   Nonallopathic lesion of rib cage 09/05/2018   Nonallopathic lesion of cervical region 09/05/2018   Nonallopathic lesion of lumbosacral region 09/05/2018   Nonallopathic lesion of sacral region 09/05/2018   Skin cancer    Chronic right shoulder pain 03/30/2017   Right shoulder pain 01/12/2017   Tendinopathy of rotator cuff 12/20/2016   Angle-closure glaucoma, severe stage 08/19/2015   Osteopenia 06/19/2015   Migraine without aura and without status migrainosus, not intractable 01/13/2015   High risk medications (not anticoagulants) long-term use 09/26/2014   Routine general medical examination at a health care facility 07/27/2012   Hyperlipidemia 09/29/2010   URETEROPELVIC JUNCTION OBSTRUCTION, CONGENITAL 12/15/2009    CARDIAC MURMUR 11/26/2008   MAGNETIC RESONANCE IMAGING, BRAIN, ABNORMAL 10/29/2008   VENEREAL WART 10/23/2008   Major depressive disorder, recurrent episode ([place]) 10/23/2008   Essential hypertension 10/23/2008   Allergic rhinitis 10/23/2008   Asthma 10/23/2008   RENAL CALCULUS, HX OF 10/23/2008   Past Medical History:  Diagnosis Date   Allergy    Anxiety    Asthma    Depression    Hyperlipidemia    Hypertension    Migraines    Osteopenia after menopause    Persistent headaches    Pneumonia 2020   Postherpetic neuralgia    Renal disorder    Shingles outbreak 02/2014   Skin cancer    moles on right groin and right buttock.   Past Surgical History:  Procedure Laterality Date   CARDIOVASCULAR STRESS TEST  02/2009   treadmill stress test: Low risk   CESAREAN SECTION  1987, 1989   COLONOSCOPY     2014/2015 [facility], normal   COLONOSCOPY WITH PROPOFOL N/A 10/20/2021   Procedure: COLONOSCOPY WITH PROPOFOL;  Surgeon: Jonathon Bellows, MD;  Location: [facility] ENDOSCOPY;  Service: Gastroenterology;  Laterality: N/A;   DIAGNOSTIC LAPAROSCOPY     EXPLORATORY LAPAROTOMY     laproscopy for infertility   EYE SURGERY Bilateral    laser-correct opening b/tn cornea and iris   MOLE REMOVAL  2017   x 2 moles   No Known Allergies Prior to Admission medications   Medication Sig Start Date End Date Taking? Authorizing Provider  albuterol (VENTOLIN HFA) 108 (90 Base)  MCG/ACT inhaler TAKE 2 PUFFS BY MOUTH EVERY 6 HOURS AS NEEDED FOR WHEEZE OR SHORTNESS OF BREATH 07/29/21  Yes [name], MD  atorvastatin (LIPITOR) 20 MG tablet TAKE 1 TABLET BY MOUTH EVERY DAY 03/12/22  Yes [name], MD  budesonide-formoterol [facility]) 160-4.5 MCG/ACT inhaler Inhale 2 puffs into the lungs 2 (two) times daily. in the morning and at bedtime. 12/18/20  Yes Parrett, Tammy S, NP  Cholecalciferol (VITAMIN D3 PO) Take 5,000 Units by mouth.   Yes [provider]  clonazePAM (KLONOPIN) 0.5 MG  tablet Take 0.5 mg by mouth daily as needed. 09/06/19  Yes [provider]  diclofenac Sodium (VOLTAREN) 1 % GEL Apply topically 4 (four) times daily.   Yes [provider]  ELDERBERRY PO Take by mouth.   Yes [provider]  escitalopram (LEXAPRO) 10 MG tablet Take 10 mg by mouth daily. 07/03/19  Yes [provider]  Fremanezumab-vfrm (AJOVY) 225 MG/1.5ML SOAJ Inject 225 mg into the skin every 30 (thirty) days. 02/16/22  Yes Penumalli, Vikram R, MD  gabapentin (NEURONTIN) 400 MG capsule TAKE 1 CAPSULE BY MOUTH 2 TIMES DAILY. 09/08/21  Yes Penumalli, Earlean Polka, MD  Glucosamine-Chondroitin (MOVE FREE PO) Take by mouth daily.   Yes [provider]  latanoprost (XALATAN) 0.005 % ophthalmic solution Place 1 drop into both eyes at bedtime. 09/05/19  Yes [name], MD  magnesium (MAGTAB) 84 MG (7MEQ) TBCR SR tablet Take 84 mg by mouth.   Yes [provider]  Misc Natural Products (JOINT HEALTH PO) Take by mouth.   Yes [provider]  montelukast (SINGULAIR) 10 MG tablet TAKE 1 TABLET BY MOUTH EVERYDAY AT BEDTIME 11/16/21  Yes [name], MD  Multiple Vitamins-Minerals (WOMENS MULTIVITAMIN PO) Take by mouth.   Yes [provider]  Probiotic Product (PROBIOTIC PO) Take by mouth.   Yes [provider]  QUEtiapine (SEROQUEL XR) 400 MG 24 hr tablet Take 1 tablet (400 mg total) by mouth at bedtime. Patient taking differently: Take 450 mg by mouth at bedtime. 02/15/19  Yes Money, Lowry Ram, FNP  QUEtiapine (SEROQUEL XR) 50 MG TB24 24 hr tablet Take 100 mg by mouth at bedtime. 02/21/21  Yes [provider]  Rimegepant Sulfate (NURTEC) 75 MG TBDP Take by mouth.   Yes [provider]  rizatriptan (MAXALT) 10 MG tablet Take 1 tablet (10 mg total) by mouth as needed for migraine. May repeat in 2 hours if needed 02/22/22  Yes Penumalli, Earlean Polka, MD  Spacer/Aero-Holding Chambers (AEROCHAMBER PLUS) inhaler Use  as instructed 01/13/21  Yes Kasa, Maretta Bees, MD  temazepam (RESTORIL) 15 MG capsule Take 1 capsule (15 mg total) by mouth at bedtime. 02/15/19  Yes Money, Lowry Ram, FNP  triamterene-hydrochlorothiazide (MAXZIDE-25) 37.5-25 MG tablet TAKE 1 TABLET BY MOUTH EVERY DAY 03/18/22  Yes [name], MD  UNABLE TO FIND Med Name: magnesium L theronate  650 [facility] at [facility]   Yes [provider]  zinc gluconate 50 MG tablet Take 50 mg by mouth daily.   Yes [provider]  baclofen (LIORESAL) 10 MG tablet Take 5-10 mg by mouth at bedtime. Patient not taking: Reported on 03/26/2022 01/25/22   [provider]  celecoxib (CELEBREX) 100 MG capsule Take 100 mg by mouth 2 (two) times daily. Patient not taking: Reported on 03/10/2022 01/18/22   [provider]  lamoTRIgine (LAMICTAL) 25 MG tablet Take 25 mg by mouth daily. Patient not taking: Reported on  03/10/2022 11/06/21   [provider]  predniSONE (DELTASONE) 10 MG tablet 3 tabs x3 days and then 2 tabs x3 days and then 1 tab x3 days.  Take w/ food. Patient not taking: Reported on 03/10/2022 02/03/22   [name], MD   Social History   Socioeconomic History   Marital status: Legally Separated    Spouse name: Not on file   Number of children: 2   Years of education: Not on file   Highest education level: Not on file  Occupational History   Occupation: Control and instrumentation engineer   Occupation: retired  Tobacco Use   Smoking status: Never   Smokeless tobacco: Never  Scientific laboratory technician Use: Never used  Substance and Sexual Activity   Alcohol use: No    Alcohol/week: 0.0 standard drinks of alcohol   Drug use: No   Sexual activity: Not Currently  Other Topics Concern   Not on file  Social History Narrative   Regular exercise- yes, spins daily   Diet: fruits and veggies, water    No caffeine   Lives alone    Social Determinants of Health   Financial Resource Strain: Low Risk  (04/09/2021)   Overall  Financial Resource Strain (CARDIA)    Difficulty of Paying Living Expenses: Not hard at all  Food Insecurity: No Food Insecurity (07/03/2020)   Hunger Vital Sign    Worried About Running Out of Food in the Last Year: Never true    [place] in the Last Year: Never true  Transportation Needs: No Transportation Needs (04/09/2021)   PRAPARE - Hydrologist (Medical): No    Lack of Transportation (Non-Medical): No  Physical Activity: Inactive (04/09/2021)   Exercise Vital Sign    Days of Exercise per Week: 0 days    Minutes of Exercise per Session: 0 min  Stress: No Stress Concern Present (04/09/2021)   [place]    Feeling of Stress : Not at all  Social Connections: Moderately Isolated (04/09/2021)   Social Connection and Isolation Panel [NHANES]    Frequency of Communication with Friends and Family: Twice a week    Frequency of Social Gatherings with Friends and Family: Twice a week    Attends Religious Services: More than 4 times per year    Active Member of [employer] or Organizations: No    Attends Archivist Meetings: Never    Marital Status: Separated  Intimate Partner Violence: Not At Risk (04/09/2021)   Humiliation, Afraid, Rape, and Kick questionnaire    Fear of Current or Ex-Partner: No    Emotionally Abused: No    Physically Abused: No    Sexually Abused: No    Review of Systems  Per HPI.  Objective:   Vitals:   03/26/22 1053  BP: 130/76  Pulse: 76  Temp: 97.6 F (36.4 C)  SpO2: 96%  Weight: 119 lb 9.6 oz (54.3 kg)  Height: '5\' 1"'$  (1.549 m)     Physical Exam Vitals reviewed.  Constitutional:      General: She is not in acute distress.    Appearance: She is well-developed.  HENT:     Head: Normocephalic and atraumatic.     Right Ear: Hearing and external ear normal.     Left Ear: Hearing, tympanic membrane and external ear normal.     Ears:     Comments:  Some cerumen at external canal removed  with lighted curette, not fully obstructed but some initial difficulty examining TM.  After removal visualized TM on the right appears to be slightly dull but no erythema or appreciated fluid.  Canal is clear otherwise.  Small amount of cerumen in external canal also removed on left.  Left TM pearly gray, possible small amount of fluid at base but no erythema or retraction.  Not bulging.  Canals clear. No pain with traction of pinna, mastoids nontender bilaterally    Nose: Nose normal.     Mouth/Throat:     Pharynx: No posterior oropharyngeal erythema.  Eyes:     Conjunctiva/sclera: Conjunctivae normal.     Pupils: Pupils are equal, round, and reactive to light.  Cardiovascular:     Rate and Rhythm: Normal rate and regular rhythm.     Heart sounds: Normal heart sounds. No murmur heard. Pulmonary:     Effort: Pulmonary effort is normal. No respiratory distress.     Breath sounds: Normal breath sounds. No wheezing or rhonchi.  Skin:    General: Skin is warm and dry.     Findings: No rash.  Neurological:     Mental Status: She is alert and oriented to person, place, and time.  Psychiatric:        Mood and Affect: Mood normal.        Behavior: Behavior normal.   Minimal change with auto insufflation technique. Cerumen bilaterally, consent to remove with lighted curette, no complications.  Improved after removal.    Assessment & Plan:  SHRUTHI NORTHRUP is a 66 y.o. female . Sensation of fullness in both ears  Excessive cerumen in both ear canals  Flu vaccine need - Plan: Flu Vaccine QUAD High Dose(Fluad)  Initial otitis media has improved, URI symptoms improved.  Residual fullness may be component of otitis media with effusion or eustachian tube dysfunction.  Minimal cerumen, not obstructive.   -Trial of Flonase nasal spray, auto insufflation discussed, Afrin temporarily.  Follow-up with ENT planned.  RTC precautions if acute or worsening  symptoms.  No orders of the defined types were placed in this encounter.  Patient Instructions  Left ear looks much better today, I do not see any sign of infection.  Minimal cerumen in the canal/earwax but that was removed and not the cause of the ear symptoms.  You could have some eustachian tube dysfunction which may cause some fluid in the middle ear, but again it does not appear infected today.  Try Flonase nasal spray over-the-counter as we discussed using the technique we discussed.  Afrin nasal spray is also an option up to 3 days only.  I do recommend follow-up with ENT if persistent fluid feeling in the ears, be seen if any new or worsening symptoms.  Flu vaccine given today, there is no minimum waiting period between that and RSV vaccine, you can have that at your pharmacy when convenient.  Thank you for coming in today and let me know if there are questions.      Signed,   Merri Ray, MD [place], [facility] 03/26/22 11:36 AM

## 2022-03-27 ENCOUNTER — Ambulatory Visit
Admission: RE | Admit: 2022-03-27 | Discharge: 2022-03-27 | Disposition: A | Discharge status: Home / Self Care | Payer: Medicare Other | Source: Ambulatory Visit | Attending: Sports Medicine | Admitting: Sports Medicine

## 2022-03-27 DIAGNOSIS — M25521 Pain in right elbow: Secondary | ICD-10-CM

## 2022-03-27 DIAGNOSIS — S56511A Strain of other extensor muscle, fascia and tendon at forearm level, right arm, initial encounter: Secondary | ICD-10-CM | POA: Diagnosis not present

## 2022-03-27 DIAGNOSIS — S53431A Radial collateral ligament sprain of right elbow, initial encounter: Secondary | ICD-10-CM | POA: Diagnosis not present

## 2022-03-31 DIAGNOSIS — S39012S Strain of muscle, fascia and tendon of lower back, sequela: Secondary | ICD-10-CM | POA: Diagnosis not present

## 2022-03-31 DIAGNOSIS — M5451 Vertebrogenic low back pain: Secondary | ICD-10-CM | POA: Diagnosis not present

## 2022-04-02 DIAGNOSIS — F3181 Bipolar II disorder: Secondary | ICD-10-CM | POA: Diagnosis not present

## 2022-04-02 DIAGNOSIS — F411 Generalized anxiety disorder: Secondary | ICD-10-CM | POA: Diagnosis not present

## 2022-04-05 DIAGNOSIS — H6983 Other specified disorders of Eustachian tube, bilateral: Secondary | ICD-10-CM | POA: Diagnosis not present

## 2022-04-07 DIAGNOSIS — S39012S Strain of muscle, fascia and tendon of lower back, sequela: Secondary | ICD-10-CM | POA: Diagnosis not present

## 2022-04-07 DIAGNOSIS — M5451 Vertebrogenic low back pain: Secondary | ICD-10-CM | POA: Diagnosis not present

## 2022-04-09 DIAGNOSIS — M7711 Lateral epicondylitis, right elbow: Secondary | ICD-10-CM | POA: Diagnosis not present

## 2022-04-09 DIAGNOSIS — M25521 Pain in right elbow: Secondary | ICD-10-CM | POA: Diagnosis not present

## 2022-04-14 DIAGNOSIS — M5451 Vertebrogenic low back pain: Secondary | ICD-10-CM | POA: Diagnosis not present

## 2022-04-14 DIAGNOSIS — S39012S Strain of muscle, fascia and tendon of lower back, sequela: Secondary | ICD-10-CM | POA: Diagnosis not present

## 2022-04-16 DIAGNOSIS — F411 Generalized anxiety disorder: Secondary | ICD-10-CM | POA: Diagnosis not present

## 2022-04-16 DIAGNOSIS — F3181 Bipolar II disorder: Secondary | ICD-10-CM | POA: Diagnosis not present

## 2022-04-20 DIAGNOSIS — M9908 Segmental and somatic dysfunction of rib cage: Secondary | ICD-10-CM | POA: Diagnosis not present

## 2022-04-20 DIAGNOSIS — M9905 Segmental and somatic dysfunction of pelvic region: Secondary | ICD-10-CM | POA: Diagnosis not present

## 2022-04-20 DIAGNOSIS — S39012S Strain of muscle, fascia and tendon of lower back, sequela: Secondary | ICD-10-CM | POA: Diagnosis not present

## 2022-04-20 DIAGNOSIS — M9906 Segmental and somatic dysfunction of lower extremity: Secondary | ICD-10-CM | POA: Diagnosis not present

## 2022-04-20 DIAGNOSIS — M9904 Segmental and somatic dysfunction of sacral region: Secondary | ICD-10-CM | POA: Diagnosis not present

## 2022-04-20 DIAGNOSIS — M25551 Pain in right hip: Secondary | ICD-10-CM | POA: Diagnosis not present

## 2022-04-20 DIAGNOSIS — M9903 Segmental and somatic dysfunction of lumbar region: Secondary | ICD-10-CM | POA: Diagnosis not present

## 2022-04-20 DIAGNOSIS — M5451 Vertebrogenic low back pain: Secondary | ICD-10-CM | POA: Diagnosis not present

## 2022-04-21 DIAGNOSIS — F3181 Bipolar II disorder: Secondary | ICD-10-CM | POA: Diagnosis not present

## 2022-04-21 DIAGNOSIS — F411 Generalized anxiety disorder: Secondary | ICD-10-CM | POA: Diagnosis not present

## 2022-04-26 DIAGNOSIS — F411 Generalized anxiety disorder: Secondary | ICD-10-CM | POA: Diagnosis not present

## 2022-04-26 DIAGNOSIS — F3132 Bipolar disorder, current episode depressed, moderate: Secondary | ICD-10-CM | POA: Diagnosis not present

## 2022-04-29 ENCOUNTER — Ambulatory Visit (INDEPENDENT_AMBULATORY_CARE_PROVIDER_SITE_OTHER): Payer: Medicare Other

## 2022-04-29 VITALS — Ht 61.0 in | Wt 119.0 lb

## 2022-04-29 DIAGNOSIS — Z0000 Encounter for general adult medical examination without abnormal findings: Secondary | ICD-10-CM | POA: Diagnosis not present

## 2022-04-29 NOTE — Patient Instructions (Addendum)
Theresa Martin , Thank you for taking time to come for your Medicare Wellness Visit. I appreciate your ongoing commitment to your health goals. Please review the following plan we discussed and let me know if I can assist you in the future.   These are the goals we discussed:  Goals       Patient Stated     patient states (pt-stated)      Maintain current health by staying active and making healthy food choices.       Other     Patient Stated      Maintain current health by staying active and eating well.       Patient Stated      Regain independence         This is a list of the screening recommended for you and due dates:  Health Maintenance  Topic Date Due   DTaP/Tdap/Td vaccine (2 - Td or Tdap) 07/28/2022   Mammogram  09/15/2022   Medicare Annual Wellness Visit  04/30/2023   Pneumonia Vaccine (3 - PPSV23 or PCV20) 01/16/2025   Colon Cancer Screening  10/21/2031   Flu Shot  Completed   DEXA scan (bone density measurement)  Completed   Hepatitis C Screening: USPSTF Recommendation to screen - Ages 18-79 yo.  Completed   Zoster (Shingles) Vaccine  Completed   HPV Vaccine  Aged Out   COVID-19 Vaccine  Discontinued    Advanced directives: End of life planning; Advance aging; Advanced directives discussed.  Copy of current HCPOA/Living Will requested.    Conditions/risks identified: none new  Next appointment: Follow up in one year for your annual wellness visit    Preventive Care 65 Years and Older, Female Preventive care refers to lifestyle choices and visits with your health care provider that can promote health and wellness. What does preventive care include? A yearly physical exam. This is also called an annual well check. Dental exams once or twice a year. Routine eye exams. Ask your health care provider how often you should have your eyes checked. Personal lifestyle choices, including: Daily care of your teeth and gums. Regular physical activity. Eating a  healthy diet. Avoiding tobacco and drug use. Limiting alcohol use. Practicing safe sex. Taking low-dose aspirin every day. Taking vitamin and mineral supplements as recommended by your health care provider. What happens during an annual well check? The services and screenings done by your health care provider during your annual well check will depend on your age, overall health, lifestyle risk factors, and family history of disease. Counseling  Your health care provider may ask you questions about your: Alcohol use. Tobacco use. Drug use. Emotional well-being. Home and relationship well-being. Sexual activity. Eating habits. History of falls. Memory and ability to understand (cognition). Work and work Statistician. Reproductive health. Screening  You may have the following tests or measurements: Height, weight, and BMI. Blood pressure. Lipid and cholesterol levels. These may be checked every 5 years, or more frequently if you are over 66 years old. Skin check. Lung cancer screening. You may have this screening every year starting at age 15 if you have a 30-pack-year history of smoking and currently smoke or have quit within the past 15 years. Fecal occult blood test (FOBT) of the stool. You may have this test every year starting at age 61. Flexible sigmoidoscopy or colonoscopy. You may have a sigmoidoscopy every 5 years or a colonoscopy every 10 years starting at age 22. Hepatitis C blood test. Hepatitis B  blood test. Sexually transmitted disease (STD) testing. Diabetes screening. This is done by checking your blood sugar (glucose) after you have not eaten for a while (fasting). You may have this done every 1-3 years. Bone density scan. This is done to screen for osteoporosis. You may have this done starting at age 28. Mammogram. This may be done every 1-2 years. Talk to your health care provider about how often you should have regular mammograms. Talk with your health care  provider about your test results, treatment options, and if necessary, the need for more tests. Vaccines  Your health care provider may recommend certain vaccines, such as: Influenza vaccine. This is recommended every year. Tetanus, diphtheria, and acellular pertussis (Tdap, Td) vaccine. You may need a Td booster every 10 years. Zoster vaccine. You may need this after age 20. Pneumococcal 13-valent conjugate (PCV13) vaccine. One dose is recommended after age 3. Pneumococcal polysaccharide (PPSV23) vaccine. One dose is recommended after age 87. Talk to your health care provider about which screenings and vaccines you need and how often you need them. This information is not intended to replace advice given to you by your health care provider. Make sure you discuss any questions you have with your health care provider. Document Released: 04/11/2015 Document Revised: 12/03/2015 Document Reviewed: 01/14/2015 Elsevier Interactive Patient Education  2017 [place] Prevention in the Home Falls can cause injuries. They can happen to people of all ages. There are many things you can do to make your home safe and to help prevent falls. What can I do on the outside of my home? Regularly fix the edges of walkways and driveways and fix any cracks. Remove anything that might make you trip as you walk through a door, such as a raised step or threshold. Trim any bushes or trees on the path to your home. Use bright outdoor lighting. Clear any walking paths of anything that might make someone trip, such as rocks or tools. Regularly check to see if handrails are loose or broken. Make sure that both sides of any steps have handrails. Any raised decks and porches should have guardrails on the edges. Have any leaves, snow, or ice cleared regularly. Use sand or salt on walking paths during winter. Clean up any spills in your garage right away. This includes oil or grease spills. What can I do in the  bathroom? Use night lights. Install grab bars by the toilet and in the tub and shower. Do not use towel bars as grab bars. Use non-skid mats or decals in the tub or shower. If you need to sit down in the shower, use a plastic, non-slip stool. Keep the floor dry. Clean up any water that spills on the floor as soon as it happens. Remove soap buildup in the tub or shower regularly. Attach bath mats securely with double-sided non-slip rug tape. Do not have throw rugs and other things on the floor that can make you trip. What can I do in the bedroom? Use night lights. Make sure that you have a light by your bed that is easy to reach. Do not use any sheets or blankets that are too big for your bed. They should not hang down onto the floor. Have a firm chair that has side arms. You can use this for support while you get dressed. Do not have throw rugs and other things on the floor that can make you trip. What can I do in the kitchen? Clean up any spills  right away. Avoid walking on wet floors. Keep items that you use a lot in easy-to-reach places. If you need to reach something above you, use a strong step stool that has a grab bar. Keep electrical cords out of the way. Do not use floor polish or wax that makes floors slippery. If you must use wax, use non-skid floor wax. Do not have throw rugs and other things on the floor that can make you trip. What can I do with my stairs? Do not leave any items on the stairs. Make sure that there are handrails on both sides of the stairs and use them. Fix handrails that are broken or loose. Make sure that handrails are as long as the stairways. Check any carpeting to make sure that it is firmly attached to the stairs. Fix any carpet that is loose or worn. Avoid having throw rugs at the top or bottom of the stairs. If you do have throw rugs, attach them to the floor with carpet tape. Make sure that you have a light switch at the top of the stairs and the  bottom of the stairs. If you do not have them, ask someone to add them for you. What else can I do to help prevent falls? Wear shoes that: Do not have high heels. Have rubber bottoms. Are comfortable and fit you well. Are closed at the toe. Do not wear sandals. If you use a stepladder: Make sure that it is fully opened. Do not climb a closed stepladder. Make sure that both sides of the stepladder are locked into place. Ask someone to hold it for you, if possible. Clearly mark and make sure that you can see: Any grab bars or handrails. First and last steps. Where the edge of each step is. Use tools that help you move around (mobility aids) if they are needed. These include: Canes. Walkers. Scooters. Crutches. Turn on the lights when you go into a dark area. Replace any light bulbs as soon as they burn out. Set up your furniture so you have a clear path. Avoid moving your furniture around. If any of your floors are uneven, fix them. If there are any pets around you, be aware of where they are. Review your medicines with your doctor. Some medicines can make you feel dizzy. This can increase your chance of falling. Ask your doctor what other things that you can do to help prevent falls. This information is not intended to replace advice given to you by your health care provider. Make sure you discuss any questions you have with your health care provider. Document Released: 01/09/2009 Document Revised: 08/21/2015 Document Reviewed: 04/19/2014 Elsevier Interactive Patient Education  2017 [employer].

## 2022-04-29 NOTE — Progress Notes (Signed)
Subjective:   Theresa Martin is a 67 y.o. female who presents for Medicare Annual (Subsequent) preventive examination.  Review of Systems    No ROS.  Medicare Wellness Virtual Visit.  Visual/audio telehealth visit, UTA vital signs.   See social history for additional risk factors.   Cardiac Risk Factors include: advanced age (>46mn, >>26women)     Objective:    Today's Vitals   04/29/22 1232  Weight: 119 lb (54 kg)  Height: '5\' 1"'$  (1.549 m)   Body mass index is 22.48 kg/m.     04/29/2022   11:34 AM 04/09/2021    3:48 PM 03/24/2020    9:04 AM 02/28/2019    1:42 PM 02/01/2019    5:14 PM 01/31/2019    8:19 AM 01/28/2019    4:23 AM  Advanced Directives  Does Patient Have a Medical Advance Directive? Yes Yes No No  No;Yes No  Type of Advance Directive Healthcare Power of ACampbellin Chart?  No - copy requested    No - copy requested   Would patient like information on creating a medical advance directive?   No - Patient declined Yes (MAU/Ambulatory/Procedural Areas - Information given)  No - Patient declined      Information is confidential and restricted. Go to Review Flowsheets to unlock data.    Current Medications (verified) Outpatient Encounter Medications as of 04/29/2022  Medication Sig   albuterol (VENTOLIN HFA) 108 (90 Base) MCG/ACT inhaler TAKE 2 PUFFS BY MOUTH EVERY 6 HOURS AS NEEDED FOR WHEEZE OR SHORTNESS OF BREATH   atorvastatin (LIPITOR) 20 MG tablet TAKE 1 TABLET BY MOUTH EVERY DAY   baclofen (LIORESAL) 10 MG tablet Take 5-10 mg by mouth at bedtime. (Patient not taking: Reported on 03/26/2022)   budesonide-formoterol (SYMBICORT) 160-4.5 MCG/ACT inhaler Inhale 2 puffs into the lungs 2 (two) times daily. in the morning and at bedtime.   celecoxib (CELEBREX) 100 MG capsule Take 100 mg by mouth 2 (two) times daily. (Patient not taking: Reported on 03/10/2022)    Cholecalciferol (VITAMIN D3 PO) Take 5,000 Units by mouth.   clonazePAM (KLONOPIN) 0.5 MG tablet Take 0.5 mg by mouth daily as needed.   diclofenac Sodium (VOLTAREN) 1 % GEL Apply topically 4 (four) times daily.   ELDERBERRY PO Take by mouth.   escitalopram (LEXAPRO) 10 MG tablet Take 10 mg by mouth daily.   Fremanezumab-vfrm (AJOVY) 225 MG/1.5ML SOAJ Inject 225 mg into the skin every 30 (thirty) days.   gabapentin (NEURONTIN) 400 MG capsule TAKE 1 CAPSULE BY MOUTH 2 TIMES DAILY.   Glucosamine-Chondroitin (MOVE FREE PO) Take by mouth daily.   lamoTRIgine (LAMICTAL) 25 MG tablet Take 25 mg by mouth daily. (Patient not taking: Reported on 03/10/2022)   latanoprost (XALATAN) 0.005 % ophthalmic solution Place 1 drop into both eyes at bedtime.   magnesium (MAGTAB) 84 MG (7MEQ) TBCR SR tablet Take 84 mg by mouth.   Misc Natural Products (JOINT HEALTH PO) Take by mouth.   montelukast (SINGULAIR) 10 MG tablet TAKE 1 TABLET BY MOUTH EVERYDAY AT BEDTIME   Multiple Vitamins-Minerals (WOMENS MULTIVITAMIN PO) Take by mouth.   predniSONE (DELTASONE) 10 MG tablet 3 tabs x3 days and then 2 tabs x3 days and then 1 tab x3 days.  Take w/ food. (Patient not taking: Reported on 03/10/2022)   Probiotic Product (PROBIOTIC PO) Take by mouth.   QUEtiapine (  SEROQUEL XR) 400 MG 24 hr tablet Take 1 tablet (400 mg total) by mouth at bedtime. (Patient taking differently: Take 450 mg by mouth at bedtime.)   QUEtiapine (SEROQUEL XR) 50 MG TB24 24 hr tablet Take 100 mg by mouth at bedtime.   Rimegepant Sulfate (NURTEC) 75 MG TBDP Take by mouth.   rizatriptan (MAXALT) 10 MG tablet Take 1 tablet (10 mg total) by mouth as needed for migraine. May repeat in 2 hours if needed   Spacer/Aero-Holding Chambers (AEROCHAMBER PLUS) inhaler Use as instructed   temazepam (RESTORIL) 15 MG capsule Take 1 capsule (15 mg total) by mouth at bedtime.   triamterene-hydrochlorothiazide (MAXZIDE-25) 37.5-25 MG tablet TAKE 1 TABLET BY MOUTH EVERY DAY    UNABLE TO FIND Med Name: magnesium L theronate  650 mg at HS   zinc gluconate 50 MG tablet Take 50 mg by mouth daily.   No facility-administered encounter medications on file as of 04/29/2022.    Allergies (verified) Patient has no known allergies.   History: Past Medical History:  Diagnosis Date   Allergy    Anxiety    Asthma    Depression    Hyperlipidemia    Hypertension    Migraines    Osteopenia after menopause    Persistent headaches    Pneumonia 2020   Postherpetic neuralgia    Renal disorder    Shingles outbreak 02/2014   Skin cancer    moles on right groin and right buttock.   Past Surgical History:  Procedure Laterality Date   CARDIOVASCULAR STRESS TEST  02/2009   treadmill stress test: Low risk   CESAREAN SECTION  1987, 1989   COLONOSCOPY     2014/2015 [facility], normal   COLONOSCOPY WITH PROPOFOL N/A 10/20/2021   Procedure: COLONOSCOPY WITH PROPOFOL;  Surgeon: Jonathon Bellows, MD;  Location: [facility] ENDOSCOPY;  Service: Gastroenterology;  Laterality: N/A;   DIAGNOSTIC LAPAROSCOPY     EXPLORATORY LAPAROTOMY     laproscopy for infertility   EYE SURGERY Bilateral    laser-correct opening b/tn cornea and iris   MOLE REMOVAL  2017   x 2 moles   Family History  Problem Relation Age of Onset   Hypertension Mother    Coronary artery disease Mother    Heart failure Mother    Transient ischemic attack Mother    Hypertension Father    Liver disease Sister        liver failure   Migraines Sister    Arrhythmia Brother    Hypertension Brother    Cancer Neg Hx        colon    Breast cancer Neg Hx    Social History   Socioeconomic History   Marital status: Legally Separated    Spouse name: Not on file   Number of children: 2   Years of education: Not on file   Highest education level: Not on file  Occupational History   Occupation: Control and instrumentation engineer   Occupation: retired  Tobacco Use   Smoking status: Never   Smokeless tobacco: Never  Brewing technologist Use: Never used  Substance and Sexual Activity   Alcohol use: No    Alcohol/week: 0.0 standard drinks of alcohol   Drug use: No   Sexual activity: Not Currently  Other Topics Concern   Not on file  Social History Narrative   Regular exercise- yes, spins daily   Diet: fruits and veggies, water    No caffeine   Lives alone  Social Determinants of Health   Financial Resource Strain: Low Risk  (04/29/2022)   Overall Financial Resource Strain (CARDIA)    Difficulty of Paying Living Expenses: Not hard at all  Food Insecurity: No Food Insecurity (04/29/2022)   Hunger Vital Sign    Worried About Running Out of Food in the Last Year: Never true    Ran Out of Food in the Last Year: Never true  Transportation Needs: No Transportation Needs (04/29/2022)   PRAPARE - Hydrologist (Medical): No    Lack of Transportation (Non-Medical): No  Physical Activity: Inactive (04/09/2021)   Exercise Vital Sign    Days of Exercise per Week: 0 days    Minutes of Exercise per Session: 0 min  Stress: Stress Concern Present (04/29/2022)   [place]    Feeling of Stress : To some extent  Social Connections: Moderately Isolated (04/29/2022)   Social Connection and Isolation Panel [NHANES]    Frequency of Communication with Friends and Family: Twice a week    Frequency of Social Gatherings with Friends and Family: Twice a week    Attends Religious Services: More than 4 times per year    Active Member of [employer] or Organizations: No    Attends Archivist Meetings: Never    Marital Status: Separated    Tobacco Counseling Counseling given: Not Answered   Clinical Intake:  Pre-visit preparation completed: Yes        Diabetes: No  How often do you need to have someone help you when you read instructions, pamphlets, or other written materials from your doctor or pharmacy?: 1 -  Never    Interpreter Needed?: No      Activities of Daily Living    04/29/2022   11:40 AM  In your present state of health, do you have any difficulty performing the following activities:  Hearing? 0  Vision? 0  Difficulty concentrating or making decisions? 0  Walking or climbing stairs? 0  Dressing or bathing? 0  Doing errands, shopping? 0  Preparing Food and eating ? N  Using the Toilet? N  In the past six months, have you accidently leaked urine? N  Do you have problems with loss of bowel control? N  Managing your Medications? N  Managing your Finances? N  Housekeeping or managing your Housekeeping? N    Patient Care Team: [name], MD as PCP - General (Family Medicine) Dalia Heading, CNM (Inactive) as Midwife (Certified Nurse Midwife) Bary Castilla, Forest Gleason, MD (General Surgery) Ulis Rias, MD (Psychiatry) Penni Bombard, MD as Consulting Physician (Neurology) Sable Feil, MD as Consulting Physician (Gastroenterology) Rushie Chestnut (Psychiatry) Mickle Plumb [facility]) Dermatology, [facility] (Dermatology) Gerda Diss, DO as Consulting Physician (Family Medicine) Debbora Presto, NP as Nurse Practitioner (Neurology) Flora Lipps, MD as Consulting Physician (Pulmonary Disease) Madelin Rear, [facility] (Inactive) as Pharmacist (Pharmacist)  Indicate any recent Medical Services you may have received from other than [facility] providers in the past year (date may be approximate).     Assessment:   This is a routine wellness examination for Terrisha.  I connected with  Jasamine Pottinger Gassman on 04/29/22 by a audio enabled telemedicine application and verified that I am speaking with the correct person using two identifiers.  Patient Location: Home  Provider Location: Office/Clinic  I discussed the limitations of evaluation and management by telemedicine. The patient expressed understanding and agreed to proceed.  Hearing/Vision  screen Hearing Screening - Comments:: Patient is able to hear conversational tones without difficulty.  No issues reported.   Vision Screening - Comments:: Followed by [facility] Wears corrective lenses Lasik surgery for angular closure; drops in use They have seen their ophthalmologist in the last 6-12 months.    Dietary issues and exercise activities discussed: Current Exercise Habits: Home exercise routine, Type of exercise: walking, Intensity: Mild   Goals Addressed             This Visit's Progress    Patient Stated   On track    Maintain current health by staying active and eating well.        Depression Screen    04/29/2022   12:34 PM 03/26/2022   10:52 AM 03/04/2022   11:37 AM 02/03/2022    1:37 PM 12/24/2021   11:27 AM 04/09/2021    3:49 PM 04/09/2021    3:47 PM  PHQ 2/9 Scores  PHQ - 2 Score 0 0 0 0 2 0 0  PHQ- 9 Score 0 0 0 1 5      Fall Risk    04/29/2022   11:39 AM 03/26/2022   10:52 AM 03/04/2022   11:37 AM 02/03/2022    1:38 PM 12/24/2021   11:27 AM  Fall Risk   Falls in the past year? 0 0 0 0 0  Number falls in past yr: 0 0     Injury with Fall? 0 0   0  Risk for fall due to :  No Fall Risks No Fall Risks  No Fall Risks  Follow up Falls evaluation completed;Falls prevention discussed Falls evaluation completed Falls evaluation completed Falls evaluation completed Falls evaluation completed    FALL RISK PREVENTION PERTAINING TO THE HOME: Home free of loose throw rugs in walkways, pet beds, electrical cords, etc? Yes  Adequate lighting in your home to reduce risk of falls? Yes   ASSISTIVE DEVICES UTILIZED TO PREVENT FALLS: Life alert? No  Use of a cane, walker or w/c? No  Grab bars in the bathroom? No  Shower chair or bench in shower? No  Elevated toilet seat or a handicapped toilet? No   TIMED UP AND GO: Was the test performed? No .    Cognitive Function:    06/03/2016    3:23 PM  MMSE - Mini Mental State Exam  Orientation to time  5  Orientation to Place 5  Registration 3  Attention/ Calculation 5  Recall 3  Language- name 2 objects 2  Language- repeat 1  Language- follow 3 step command 3  Language- read & follow direction 1  Write a sentence 1  Copy design 1  Total score 30        04/29/2022   12:35 PM  6CIT Screen  What Year? 0 points  What month? 0 points  What time? 0 points  Months in reverse 0 points    Immunizations Immunization History  Administered Date(s) Administered   Fluad Quad(high Dose 65+) 03/04/2020, 03/26/2022   Influenza,inj,Quad PF,6+ Mos 01/19/2017, 01/26/2019   Influenza-Unspecified 12/21/2020   Moderna Sars-Covid-2 Vaccination 09/03/2019, 09/24/2019, 02/16/2020, 02/05/2021   Pneumococcal Conjugate-13 01/17/2020   Pneumococcal Polysaccharide-23 12/25/2018   Tdap 07/27/2012   Zoster Recombinat (Shingrix) 05/04/2018, 10/26/2018   Zoster, Live 09/26/2014   Screening Tests Health Maintenance  Topic Date Due   DTaP/Tdap/Td (2 - Td or Tdap) 07/28/2022   MAMMOGRAM  09/15/2022   Medicare Annual Wellness (AWV)  04/30/2023  Pneumonia Vaccine 87+ Years old (3 - PPSV23 or PCV20) 01/16/2025   COLONOSCOPY (Pts 45-16yr Insurance coverage will need to be confirmed)  10/21/2031   INFLUENZA VACCINE  Completed   DEXA SCAN  Completed   Hepatitis C Screening  Completed   Zoster Vaccines- Shingrix  Completed   HPV VACCINES  Aged Out   COVID-19 Vaccine  Discontinued   Health Maintenance There are no preventive care reminders to display for this patient.  Lung Cancer Screening: (Low Dose CT Chest recommended if Age 68-80years, 30 pack-year currently smoking OR have quit w/in 15years.) does not qualify.   Hepatitis C Screening: Completed 2017.  Vision Screening: Recommended annual ophthalmology exams for early detection of glaucoma and other disorders of the eye.  Dental Screening: Recommended annual dental exams for proper oral hygiene.  Community Resource Referral / Chronic Care  Management: CRR required this visit?  No   CCM required this visit?  No      Plan:     I have personally reviewed and noted the following in the patient's chart:   Medical and social history Use of alcohol, tobacco or illicit drugs  Current medications and supplements including opioid prescriptions. Patient is not currently taking opioid prescriptions. Functional ability and status Nutritional status Physical activity Advanced directives List of other physicians Hospitalizations, surgeries, and ER visits in previous 12 months Vitals Screenings to include cognitive, depression, and falls Referrals and appointments  In addition, I have reviewed and discussed with patient certain preventive protocols, quality metrics, and best practice recommendations. A written personalized care plan for preventive services as well as general preventive health recommendations were provided to patient.     DLeta Jungling LPN   23/09/4825

## 2022-05-03 ENCOUNTER — Ambulatory Visit: Payer: Medicare Other | Admitting: Diagnostic Neuroimaging

## 2022-05-10 DIAGNOSIS — F3181 Bipolar II disorder: Secondary | ICD-10-CM | POA: Diagnosis not present

## 2022-05-10 DIAGNOSIS — F411 Generalized anxiety disorder: Secondary | ICD-10-CM | POA: Diagnosis not present

## 2022-05-13 ENCOUNTER — Other Ambulatory Visit: Payer: Self-pay | Admitting: Diagnostic Neuroimaging

## 2022-05-13 ENCOUNTER — Other Ambulatory Visit: Payer: Self-pay | Admitting: Family Medicine

## 2022-05-17 ENCOUNTER — Telehealth: Payer: Self-pay | Admitting: Diagnostic Neuroimaging

## 2022-05-17 ENCOUNTER — Other Ambulatory Visit: Payer: Self-pay

## 2022-05-17 MED ORDER — AJOVY 225 MG/1.5ML ~~LOC~~ SOAJ: 225.0000 mg | SUBCUTANEOUS | 0 refills | Status: DC

## 2022-05-17 NOTE — Telephone Encounter (Signed)
Pt is asking that re: her Fremanezumab-vfrm (AJOVY) 225 MG/1.5ML SOAJ , can the auto injector be called in instead of the syringe. Please call

## 2022-05-17 NOTE — Telephone Encounter (Signed)
Contacted pharmacy to verify receipt of auto injector as the Rx is not syringe. They have injection ready to pick up for patient.  They did receive a previous Rx for syringe but it was d/c'd. Pt notified.

## 2022-05-19 DIAGNOSIS — M9906 Segmental and somatic dysfunction of lower extremity: Secondary | ICD-10-CM | POA: Diagnosis not present

## 2022-05-19 DIAGNOSIS — M545 Low back pain, unspecified: Secondary | ICD-10-CM | POA: Diagnosis not present

## 2022-05-19 DIAGNOSIS — M9901 Segmental and somatic dysfunction of cervical region: Secondary | ICD-10-CM | POA: Diagnosis not present

## 2022-05-19 DIAGNOSIS — M9903 Segmental and somatic dysfunction of lumbar region: Secondary | ICD-10-CM | POA: Diagnosis not present

## 2022-05-19 DIAGNOSIS — M9905 Segmental and somatic dysfunction of pelvic region: Secondary | ICD-10-CM | POA: Diagnosis not present

## 2022-05-19 DIAGNOSIS — M542 Cervicalgia: Secondary | ICD-10-CM | POA: Diagnosis not present

## 2022-05-19 DIAGNOSIS — F3181 Bipolar II disorder: Secondary | ICD-10-CM | POA: Diagnosis not present

## 2022-05-19 DIAGNOSIS — S41101A Unspecified open wound of right upper arm, initial encounter: Secondary | ICD-10-CM | POA: Diagnosis not present

## 2022-05-19 DIAGNOSIS — M9904 Segmental and somatic dysfunction of sacral region: Secondary | ICD-10-CM | POA: Diagnosis not present

## 2022-05-19 DIAGNOSIS — F411 Generalized anxiety disorder: Secondary | ICD-10-CM | POA: Diagnosis not present

## 2022-05-19 DIAGNOSIS — M79601 Pain in right arm: Secondary | ICD-10-CM | POA: Diagnosis not present

## 2022-05-24 ENCOUNTER — Ambulatory Visit: Payer: Medicare Other | Admitting: Diagnostic Neuroimaging

## 2022-05-26 ENCOUNTER — Encounter: Payer: Self-pay | Admitting: Diagnostic Neuroimaging

## 2022-05-26 ENCOUNTER — Ambulatory Visit (INDEPENDENT_AMBULATORY_CARE_PROVIDER_SITE_OTHER): Payer: Medicare Other | Admitting: Diagnostic Neuroimaging

## 2022-05-26 VITALS — BP 133/74 | HR 77 | Ht 61.0 in | Wt 115.6 lb

## 2022-05-26 DIAGNOSIS — G43109 Migraine with aura, not intractable, without status migrainosus: Secondary | ICD-10-CM | POA: Diagnosis not present

## 2022-05-26 MED ORDER — AJOVY 225 MG/1.5ML ~~LOC~~ SOAJ: 225.0000 mg | SUBCUTANEOUS | 4 refills | Status: DC

## 2022-05-26 MED ORDER — RIZATRIPTAN BENZOATE 10 MG PO TBDP: 10.0000 mg | ORAL_TABLET | ORAL | 0 refills | Status: DC | PRN

## 2022-05-26 MED ORDER — NURTEC 75 MG PO TBDP: 75.0000 mg | ORAL_TABLET | ORAL | 12 refills | Status: DC | PRN

## 2022-05-26 MED ORDER — RIZATRIPTAN BENZOATE 10 MG PO TBDP: 10.0000 mg | ORAL_TABLET | ORAL | 12 refills | Status: DC | PRN

## 2022-05-26 NOTE — Progress Notes (Signed)
[facility]  PATIENT: Theresa Martin DOB: [birth year]  REFERRING CLINICIAN: [name], MD  HISTORY FROM: patient REASON FOR VISIT: follow up   HISTORICAL  CHIEF COMPLAINT:  Chief Complaint  Patient presents with   Follow-up    Patient in room #7 and alone. Pt here today to f/u with her migraines. Patient states that the Ajovy helps with her migraines.    HISTORY OF PRESENT ILLNESS:   UPDATE (05/26/22, VRP): Since last visit, doing well. Symptoms are improved. Now doing well with ajovy injections. 1-2 HA per month. Mainly stress associated. Nurtec and rizatriptan working well. Depression still an issue.   UPDATE (05/25/21, VRP): Since last visit, doing well, except recent injection site reaction; used same ajovy needle 3 times (05/20/21), b/c of retained medication in needle. Then had red and swelling around site. Now on steroid and abx, and sxs getting better. Never had this issue before.  UPDATE (11/17/20, VRP): Since last visit, doing well until 3 weeks ago; returned from vacation with family (camper Lucianne Lei; fun but stressful). Then almost daily left sided throbbing HA, left side numbness. Tried gabapentin increase, prednisone, indomethacin; no relief. Phonophobia. No nausea.   UPDATE (11/06/20, MM): 67 year old female with a history of migraine headaches.  She returns today for follow-up.  She reports that she has had an ongoing headache for 2 weeks.  Reports that it starts on the left side behind the eye.  She reports that she has a tingling sensation that radiates down the face and down the arm into the leg and toes.  She has been taking an over-the-counter medication Tylenol and Advil on a daily basis.  Her sports medicine doctor saw her in the last 2 weeks and did an injection.  She is not sure what the injection was.  His notes is not in epic.  She also reports that she was given a prescription for tramadol.  She states that several years ago she had a  severe headache with tingling sensations down the left side of the body and went to the emergency room.  Reports that she had a complete stroke work-up that was negative.  Patient had MRI of the brain with and without contrast in June 2021 that was unremarkable.  She remains on gabapentin 400 mg twice a day.  Reports that this has been controlling her headaches up until the last 2 weeks.  UPDATE (08/08/19, VRP): Since last visit, doing well, except past 3 months, has left eye pulling sensation, left eye pain, then left head pain. Some sensations in right eye. Also with mild glaucoma.   UPDATE (03/21/19, VRP): Since last visit, doing well until COVID pandemic. She weaned herself off her psych meds, then mania, psychosis . Then headaches worsened. Admitted to behavioral health. Now psych issues are better, and headache resolved since last month.   UPDATE (01/30/18, VRP): Since last visit, doing worse with HA (14-15 per month). More stress levels than last visit. Burning sensation in scalp continues.  UPDATE (08/24/17, VRP): Since last visit, doing well. Tolerating propranolol. Avg 4-13 migraine per month. No alleviating or aggravating factors.   Separately, had right thoracic burning pain (no rash) in fall 2018, and was tx'd empirically with acyclovir. Then with right scalp burning pain last week; also with chronic left scalp sensitivity with migraines.  UPDATE 08/24/16: Since last visit, avg 2-3 HA per month. Tolerating meds. No new issues. Mood stable. Weight improved.   UPDATE 02/23/16: Since last visit has ~ 1-5  HA per month. Tolerating propranolol + OTC tylenol/aleve for HA mgmt.   UPDATE 08/19/15: Since last visit, avg 1-10 HA per month. Usually with stress. Some more wt gain noted (~10lbs). Also had a a laser eye procedure for angle closure glaucoma, now improved.  UPDATE 04/15/15: Since last visit, doing well. No more migraine since 02/28/15. Propranolol is helping prevent HA. Mood stable.  UPDATE  01/13/15: Since last visit, having more headaches, and more fluid retention. Now being tapered off gabapentin by psychiatry due to side effects.   UPDATE 09/02/14: Doing well. Avg 3-6 days HA per month. Some HA are mild, some are severe. Taking gabapentin '200mg'$  TID. Works out every day at [employer]. Overall mood is better as well.  UPDATE 05/17/14: Since last visit, patient had some psychiatry issues, was managed by behavioral health, and her headaches significant improved. She was doing well for several years and did not follow-up in our clinic. Patient here with her father today for this visit. Patient's father notes that her headaches seem to be significantly associated with her stress levels. In the last few months her stress levels have increased significantly. Her stress levels related to her ex-husband and her daughters, and some family issues. Patient having intermittent, almost daily left-sided headaches, with numbness and tingling in the left scalp, typically in the evening when she is home. During the daytime patient stays active, works out several times a week, and does better. Patient had been on gabapentin 100 mg at bedtime for several years. Her psychiatrist increased this to 2 and then 3 capsules at bedtime. When she did 3 capsules at bedtime, her headache type symptoms paradoxical he worsened. She then went back to taking one capsule at bedtime. Strangely, she reports that she was told she was able to take this medication (gabapentin) as needed as well and for the last few weeks has been taking one capsule at bedtime, followed by 1-2 capsules every hour, throughout the night, taking up to 10-14 capsules in a 12 hour period.   PRIOR HPI (11/04/08 - 06/03/09, VRP): 67 year old right-handed female with history of high blood pressure, depression and anxiety presenting for evaluation of chronic headaches and abnormal MRI scan.  On October 26, 2008 the patient presented to the emergency room for right-sided  chest pain and was diagnosed with pneumonia.   At this time her headaches worsened in severity.  Upon review of prior MRIs demonstrating microvascular gliosis, patient was referred to our neurology clinic for futher evaluation. Patient's headaches began in 2005 consisting of a "dull pressure like sensation" over the top of her head. Occasionally these are associated with mild nausea without vomiting as well as intermittent left facial numbness.  Patient had dull nagging low level headaches on a daily basis with daily flareups involving a hot sensation over her scalp.  Sometimes this sensation starts as posterior neck pain that moves up the back of her head. She was initially evaluated with MRI scans in 2005 and 2006 and started on topiramate 100 mg at bedtime.   In addition she was taking Advil and Tylenol over-the-counter almost a daily basis.   During flareups the patient would have to lay down in place ice packs over her eyes and the top of her head.   Patient's headaches are aggravated by lack of sleep or stress. No food triggers noted.  Her other physicians decided to taper her off of NSAIDs, tylenol and also her topiramate. Patient had MRI of the head and neck, found  to have some nonspecific white matter lesions. Patient had lumbar puncture and additional blood testing, but multiple sclerosis was ruled out. Patient was treated with several occipital nerve blocks with good results.   REVIEW OF SYSTEMS: Full 14 system review of systems performed and negative except for: as per HPI.   ALLERGIES: No Known Allergies  HOME MEDICATIONS: Outpatient Medications Prior to Visit  Medication Sig Dispense Refill   albuterol (VENTOLIN HFA) 108 (90 Base) MCG/ACT inhaler TAKE 2 PUFFS BY MOUTH EVERY 6 HOURS AS NEEDED FOR WHEEZE OR SHORTNESS OF BREATH 18 g 2   atorvastatin (LIPITOR) 20 MG tablet TAKE 1 TABLET BY MOUTH EVERY DAY 90 tablet 1   budesonide-formoterol (SYMBICORT) 160-4.5 MCG/ACT inhaler Inhale 2 puffs  into the lungs 2 (two) times daily. in the morning and at bedtime. 10.2 each 5   celecoxib (CELEBREX) 100 MG capsule Take 30 mg by mouth daily.     Cholecalciferol (VITAMIN D3 PO) Take 5,000 Units by mouth.     clonazePAM (KLONOPIN) 0.5 MG tablet Take 0.5 mg by mouth daily as needed.     diclofenac Sodium (VOLTAREN) 1 % GEL Apply topically 4 (four) times daily.     ELDERBERRY PO Take by mouth.     escitalopram (LEXAPRO) 10 MG tablet Take 10 mg by mouth daily.     gabapentin (NEURONTIN) 400 MG capsule TAKE 1 CAPSULE BY MOUTH 2 TIMES DAILY. 60 capsule 12   Glucosamine-Chondroitin (MOVE FREE PO) Take by mouth daily.     latanoprost (XALATAN) 0.005 % ophthalmic solution Place 1 drop into both eyes at bedtime. 2.5 mL 12   magnesium (MAGTAB) 84 MG (7MEQ) TBCR SR tablet Take 84 mg by mouth.     Misc Natural Products (JOINT HEALTH PO) Take by mouth.     montelukast (SINGULAIR) 10 MG tablet TAKE 1 TABLET BY MOUTH EVERYDAY AT BEDTIME 90 tablet 1   Multiple Vitamins-Minerals (WOMENS MULTIVITAMIN PO) Take by mouth.     Probiotic Product (PROBIOTIC PO) Take by mouth.     QUEtiapine (SEROQUEL XR) 400 MG 24 hr tablet Take 1 tablet (400 mg total) by mouth at bedtime. (Patient taking differently: Take 800 mg by mouth at bedtime.) 30 tablet 1   Spacer/Aero-Holding Chambers (AEROCHAMBER PLUS) inhaler Use as instructed 1 each 2   temazepam (RESTORIL) 15 MG capsule Take 1 capsule (15 mg total) by mouth at bedtime. 30 capsule 0   triamterene-hydrochlorothiazide (MAXZIDE-25) 37.5-25 MG tablet TAKE 1 TABLET BY MOUTH EVERY DAY 90 tablet 1   UNABLE TO FIND Med Name: magnesium L theronate  650 mg at HS     zinc gluconate 50 MG tablet Take 50 mg by mouth daily.     Fremanezumab-vfrm (AJOVY) 225 MG/1.5ML SOAJ Inject 225 mg into the skin every 30 (thirty) days. 4.5 mL 0   QUEtiapine (SEROQUEL XR) 50 MG TB24 24 hr tablet Take 100 mg by mouth at bedtime.     Rimegepant Sulfate (NURTEC) 75 MG TBDP Take by mouth.      rizatriptan (MAXALT) 10 MG tablet Take 1 tablet (10 mg total) by mouth as needed for migraine. May repeat in 2 hours if needed 10 tablet 2   rizatriptan (MAXALT-MLT) 10 MG disintegrating tablet TAKE 1 TABLET BY MOUTH AS NEEDED FOR MIGRAINE. MAY REPEAT IN 2 HOURS IF NEEDED 10 tablet 0   baclofen (LIORESAL) 10 MG tablet Take 5-10 mg by mouth at bedtime. (Patient not taking: Reported on 03/26/2022)     lamoTRIgine (LAMICTAL) 25  MG tablet Take 25 mg by mouth daily. (Patient not taking: Reported on 03/10/2022)     predniSONE (DELTASONE) 10 MG tablet 3 tabs x3 days and then 2 tabs x3 days and then 1 tab x3 days.  Take w/ food. (Patient not taking: Reported on 03/10/2022) 18 tablet 0   No facility-administered medications prior to visit.    PAST MEDICAL HISTORY: Past Medical History:  Diagnosis Date   Allergy    Anxiety    Asthma    Depression    Hyperlipidemia    Hypertension    Migraines    Osteopenia after menopause    Persistent headaches    Pneumonia 2020   Postherpetic neuralgia    Renal disorder    Shingles outbreak 02/2014   Skin cancer    moles on right groin and right buttock.    PAST SURGICAL HISTORY: Past Surgical History:  Procedure Laterality Date   CARDIOVASCULAR STRESS TEST  02/2009   treadmill stress test: Low risk   CESAREAN SECTION  1987, 1989   COLONOSCOPY     2014/2015 [place], normal   COLONOSCOPY WITH PROPOFOL N/A 10/20/2021   Procedure: COLONOSCOPY WITH PROPOFOL;  Surgeon: Jonathon Bellows, MD;  Location: [facility] ENDOSCOPY;  Service: Gastroenterology;  Laterality: N/A;   DIAGNOSTIC LAPAROSCOPY     EXPLORATORY LAPAROTOMY     laproscopy for infertility   EYE SURGERY Bilateral    laser-correct opening b/tn cornea and iris   MOLE REMOVAL  2017   x 2 moles    FAMILY HISTORY: Family History  Problem Relation Age of Onset   Hypertension Mother    Coronary artery disease Mother    Heart failure Mother    Transient ischemic attack Mother    Hypertension Father     Liver disease Sister        liver failure   Migraines Sister    Arrhythmia Brother    Hypertension Brother    Cancer Neg Hx        colon    Breast cancer Neg Hx     SOCIAL HISTORY:  Social History   Socioeconomic History   Marital status: Legally Separated    Spouse name: Not on file   Number of children: 2   Years of education: Not on file   Highest education level: Not on file  Occupational History   Occupation: Control and instrumentation engineer   Occupation: retired  Tobacco Use   Smoking status: Never   Smokeless tobacco: Never  Scientific laboratory technician Use: Never used  Substance and Sexual Activity   Alcohol use: No    Alcohol/week: 0.0 standard drinks of alcohol   Drug use: No   Sexual activity: Not Currently  Other Topics Concern   Not on file  Social History Narrative   Regular exercise- yes, spins daily   Diet: fruits and veggies, water    No caffeine   Lives alone    Social Determinants of Health   Financial Resource Strain: Low Risk  (04/29/2022)   Overall Financial Resource Strain (CARDIA)    Difficulty of Paying Living Expenses: Not hard at all  Food Insecurity: No Food Insecurity (04/29/2022)   Hunger Vital Sign    Worried About Running Out of Food in the Last Year: Never true    [place] in the Last Year: Never true  Transportation Needs: No Transportation Needs (04/29/2022)   PRAPARE - Transportation    Lack of Transportation (Medical): No    Lack of  Transportation (Non-Medical): No  Physical Activity: Inactive (04/09/2021)   Exercise Vital Sign    Days of Exercise per Week: 0 days    Minutes of Exercise per Session: 0 min  Stress: Stress Concern Present (04/29/2022)   [place]    Feeling of Stress : To some extent  Social Connections: Moderately Isolated (04/29/2022)   Social Connection and Isolation Panel [NHANES]    Frequency of Communication with Friends and Family: Twice a week     Frequency of Social Gatherings with Friends and Family: Twice a week    Attends Religious Services: More than 4 times per year    Active Member of [employer] or Organizations: No    Attends Archivist Meetings: Never    Marital Status: Separated  Intimate Partner Violence: Not At Risk (04/29/2022)   Humiliation, Afraid, Rape, and Kick questionnaire    Fear of Current or Ex-Partner: No    Emotionally Abused: No    Physically Abused: No    Sexually Abused: No     PHYSICAL EXAM  Vitals:   05/26/22 1333  BP: 133/74  Pulse: 77  Weight: 115 lb 9.6 oz (52.4 kg)  Height: '5\' 1"'$  (1.549 m)   Wt Readings from Last 3 Encounters:  05/26/22 115 lb 9.6 oz (52.4 kg)  04/29/22 119 lb (54 kg)  03/26/22 119 lb 9.6 oz (54.3 kg)   Body mass index is 21.84 kg/m.  No results found.     06/03/2016    3:23 PM  MMSE - Mini Mental State Exam  Orientation to time 5  Orientation to Place 5  Registration 3  Attention/ Calculation 5  Recall 3  Language- name 2 objects 2  Language- repeat 1  Language- follow 3 step command 3  Language- read & follow direction 1  Write a sentence 1  Copy design 1  Total score 30    GENERAL EXAM: Patient is in no distress; well developed, nourished and groomed; neck is supple  CARDIOVASCULAR: Regular rate and rhythm, no murmurs, no carotid bruits  NEUROLOGIC: MENTAL STATUS: awake, alert, language fluent, comprehension intact, naming intact, fund of knowledge appropriate CRANIAL NERVE: pupils equal and reactive to light, visual fields full to confrontation, extraocular muscles intact, no nystagmus, facial sensation and strength symmetric, hearing intact, palate elevates symmetrically, uvula midline, shoulder shrug symmetric, tongue midline. MOTOR: normal bulk and tone, full strength in the BUE, BLE SENSORY: normal and symmetric to light touch, temperature, vibration COORDINATION: finger-nose-finger, fine finger movements normal REFLEXES: deep tendon  reflexes present and symmetric GAIT/STATION: narrow based gait; romberg is negative    DIAGNOSTIC DATA (LABS, IMAGING, TESTING) - I reviewed patient records, labs, notes, testing and imaging myself where available.  Lab Results  Component Value Date   WBC 4.4 02/03/2022   HGB 12.0 02/03/2022   HCT 35.5 (L) 02/03/2022   MCV 91.1 02/03/2022   PLT 221.0 02/03/2022      Component Value Date/Time   NA 136 02/03/2022 1412   NA 140 12/10/2020 0954   K 3.9 02/03/2022 1412   CL 100 02/03/2022 1412   CO2 33 (H) 02/03/2022 1412   GLUCOSE 93 02/03/2022 1412   BUN 15 02/03/2022 1412   BUN 18 12/10/2020 0954   CREATININE 0.66 02/03/2022 1412   CREATININE 0.90 06/08/2017 1534   CALCIUM 9.1 02/03/2022 1412   PROT 7.2 02/03/2022 1412   PROT 6.8 12/10/2020 0954   ALBUMIN 4.3 02/03/2022 1412  ALBUMIN 4.6 12/10/2020 0954   AST 26 02/03/2022 1412   ALT 21 02/03/2022 1412   ALKPHOS 67 02/03/2022 1412   BILITOT 0.3 02/03/2022 1412   BILITOT <0.2 12/10/2020 0954   GFRNONAA >60 02/11/2019 0702   GFRAA >60 02/11/2019 0702   Lab Results  Component Value Date   CHOL 147 02/03/2022   HDL 68.20 02/03/2022   LDLCALC 70 02/03/2022   TRIG 42.0 02/03/2022   CHOLHDL 2 02/03/2022   Lab Results  Component Value Date   HGBA1C 5.4 12/10/2020   Lab Results  Component Value Date   VITAMINB12 1,257 (H) 01/12/2021   Lab Results  Component Value Date   TSH 1.07 02/03/2022    11/13/08 LP - opening pressure 7cm H2O; WBC 1, RBC 0, glucose 62, protein 26, OCB (2 in CSF, not seen in serum), IgG index 0.5 (normal), lyme PCR neg, EBV PCR neg  11/13/08 VEP - normal  11/08/08 MRI cervical - normal  11/08/08 MRI brain (with and without contrast) - multiple supratentorial, periventricular and juxtacortical white matter lesions which may represent chronic demyelinating plaques or perivascular gliosis.  No abnormal enhancement on postcontrast views.   08/28/17 MRI brain  1.  Scattered T2/FLAIR hyperintense  foci predominantly in the deep and subcortical white matter.  This is a nonspecific finding and most likely represents chronic microvascular ischemic change.  The pattern is not typical for demyelination.  The foci are not acute and they do not enhance after contrast.  When compared to the MRI dated 05/20/2004, there has been only slight progression in the number or size of the foci. 2.  There is a normal enhancement pattern and there are no acute findings.   09/30/18 MRI brain [I reviewed images myself and agree with interpretation. -VRP]  - No acute or subacute infarction. - Numerous scattered foci of T2 and FLAIR signal within the cerebral hemispheric deep and subcortical white matter. The differential diagnosis on the basis of the imaging would be demyelinating disease versus small-vessel disease. Findings appear stable since June of last year but are progressive since 2006. Given the history of hypertension and hyperlipidemia, small-vessel disease is probably more likely  09/21/19 MRI brain - Abnormal MRI scan of the brain showing bilateral nonspecific periventricular and subcortical white matter hyperintensities with a differential discussed above.  No enhancing lesions are noted.  There are mild incidental changes of chronic paranasal sinus inflammation.  Overall no significant change compared with previous MRI from 09/30/2018  12/16/20: This MRI of the brain with and without contrast shows the following: 1.   Scattered T2/FLAIR hyperintense foci predominantly in the subcortical and deep white matter of both hemispheres.  This is a nonspecific finding but is most consistent with chronic microvascular ischemic change.  Demyelination or vasculitis would be less likely to have this pattern.  None of the foci appear to be acute.  They do not enhance.  Compared to the MRI dated 09/21/2019, there are no new lesions. 2.   Normal enhancement pattern.  No acute findings.   ASSESSMENT AND PLAN  67 y.o. year  old female here with mixed tension and migraine headaches, left occipital neuralgia, and longer standing significant depression/anxiety. Some headaches have occipital neuralgia type features. Symptoms seem to be worse with stress and external factors.   Meds tried: gabapentin, propranolol, indomethacin, ibuprofen, tylenol, toradol  Dx:  Migraine with aura and without status migrainosus, not intractable    PLAN:  MIGRAINE WITH AURA (history of angle closure glaucoma; cannot  use topiramate)  MIGRAINE PREVENTION  LIFESTYLE CHANGES -Stop or avoid smoking -Decrease or avoid caffeine / alcohol -Eat and sleep on a regular schedule -Exercise several times per week - continue fremanezumab (Ajovy) '225mg'$  monthly  MIGRAINE RESCUE  - ibuprofen, tylenol as needed - rizatriptan '10mg'$  as needed - nurtex '75mg'$  as needed  MILD SLEEP APNEA - follow up with pulmonary / ENT / PCP (already had home sleep study; now on oxygen)  Schizoaffective schizophrenia / anxiety / depression (per psychiatry) - continue seroquel '800mg'$  at bedtime  Meds ordered this encounter  Medications   Fremanezumab-vfrm (AJOVY) 225 MG/1.5ML SOAJ    Sig: Inject 225 mg into the skin every 30 (thirty) days.    Dispense:  4.5 mL    Refill:  4   Rimegepant Sulfate (NURTEC) 75 MG TBDP    Sig: Take 1 tablet (75 mg total) by mouth as needed (for breakthrough migraine).    Dispense:  8 tablet    Refill:  12   DISCONTD: rizatriptan (MAXALT-MLT) 10 MG disintegrating tablet    Sig: Take 1 tablet (10 mg total) by mouth as needed for migraine (May repeat in 2 hours if needed).    Dispense:  10 tablet    Refill:  0    Patient needs OFFICE VISIT for any future Rx Refill.   rizatriptan (MAXALT-MLT) 10 MG disintegrating tablet    Sig: Take 1 tablet (10 mg total) by mouth as needed for migraine (May repeat in 2 hours if needed).    Dispense:  10 tablet    Refill:  12   Return in about 1 year (around 05/27/2023) for with NP Ward Givens), MyChart visit (15 min).     Penni Bombard, MD [id number], [id number] PM Certified in Neurology, Neurophysiology and Neuroimaging  [facility] [address] [place], [place] [address] [phone]

## 2022-05-27 ENCOUNTER — Ambulatory Visit: Payer: Medicare Other | Admitting: Adult Health

## 2022-06-01 DIAGNOSIS — F3132 Bipolar disorder, current episode depressed, moderate: Secondary | ICD-10-CM | POA: Diagnosis not present

## 2022-06-01 DIAGNOSIS — F411 Generalized anxiety disorder: Secondary | ICD-10-CM | POA: Diagnosis not present

## 2022-06-14 DIAGNOSIS — M9902 Segmental and somatic dysfunction of thoracic region: Secondary | ICD-10-CM | POA: Diagnosis not present

## 2022-06-14 DIAGNOSIS — M542 Cervicalgia: Secondary | ICD-10-CM | POA: Diagnosis not present

## 2022-06-14 DIAGNOSIS — M66221 Spontaneous rupture of extensor tendons, right upper arm: Secondary | ICD-10-CM | POA: Diagnosis not present

## 2022-06-14 DIAGNOSIS — M9907 Segmental and somatic dysfunction of upper extremity: Secondary | ICD-10-CM | POA: Diagnosis not present

## 2022-06-14 DIAGNOSIS — M9901 Segmental and somatic dysfunction of cervical region: Secondary | ICD-10-CM | POA: Diagnosis not present

## 2022-06-14 DIAGNOSIS — M7711 Lateral epicondylitis, right elbow: Secondary | ICD-10-CM | POA: Diagnosis not present

## 2022-06-14 DIAGNOSIS — M25521 Pain in right elbow: Secondary | ICD-10-CM | POA: Diagnosis not present

## 2022-06-15 DIAGNOSIS — F411 Generalized anxiety disorder: Secondary | ICD-10-CM | POA: Diagnosis not present

## 2022-06-15 DIAGNOSIS — F3132 Bipolar disorder, current episode depressed, moderate: Secondary | ICD-10-CM | POA: Diagnosis not present

## 2022-06-22 DIAGNOSIS — F319 Bipolar disorder, unspecified: Secondary | ICD-10-CM | POA: Diagnosis not present

## 2022-06-22 DIAGNOSIS — F411 Generalized anxiety disorder: Secondary | ICD-10-CM | POA: Diagnosis not present

## 2022-07-09 DIAGNOSIS — L298 Other pruritus: Secondary | ICD-10-CM | POA: Diagnosis not present

## 2022-07-09 DIAGNOSIS — L0109 Other impetigo: Secondary | ICD-10-CM | POA: Diagnosis not present

## 2022-07-09 DIAGNOSIS — L578 Other skin changes due to chronic exposure to nonionizing radiation: Secondary | ICD-10-CM | POA: Diagnosis not present

## 2022-07-09 DIAGNOSIS — Z872 Personal history of diseases of the skin and subcutaneous tissue: Secondary | ICD-10-CM | POA: Diagnosis not present

## 2022-07-09 DIAGNOSIS — Z85820 Personal history of malignant melanoma of skin: Secondary | ICD-10-CM | POA: Diagnosis not present

## 2022-07-09 DIAGNOSIS — L2389 Allergic contact dermatitis due to other agents: Secondary | ICD-10-CM | POA: Diagnosis not present

## 2022-07-09 DIAGNOSIS — L821 Other seborrheic keratosis: Secondary | ICD-10-CM | POA: Diagnosis not present

## 2022-07-09 DIAGNOSIS — Z86018 Personal history of other benign neoplasm: Secondary | ICD-10-CM | POA: Diagnosis not present

## 2022-07-09 DIAGNOSIS — B009 Herpesviral infection, unspecified: Secondary | ICD-10-CM | POA: Diagnosis not present

## 2022-07-09 DIAGNOSIS — I781 Nevus, non-neoplastic: Secondary | ICD-10-CM | POA: Diagnosis not present

## 2022-07-09 DIAGNOSIS — K130 Diseases of lips: Secondary | ICD-10-CM | POA: Diagnosis not present

## 2022-07-12 DIAGNOSIS — M7672 Peroneal tendinitis, left leg: Secondary | ICD-10-CM | POA: Diagnosis not present

## 2022-07-12 DIAGNOSIS — M65341 Trigger finger, right ring finger: Secondary | ICD-10-CM | POA: Diagnosis not present

## 2022-07-16 ENCOUNTER — Encounter: Payer: Self-pay | Admitting: Diagnostic Neuroimaging

## 2022-07-20 DIAGNOSIS — F411 Generalized anxiety disorder: Secondary | ICD-10-CM | POA: Diagnosis not present

## 2022-07-20 DIAGNOSIS — F3132 Bipolar disorder, current episode depressed, moderate: Secondary | ICD-10-CM | POA: Diagnosis not present

## 2022-07-22 DIAGNOSIS — F411 Generalized anxiety disorder: Secondary | ICD-10-CM | POA: Diagnosis not present

## 2022-07-22 DIAGNOSIS — F3132 Bipolar disorder, current episode depressed, moderate: Secondary | ICD-10-CM | POA: Diagnosis not present

## 2022-07-28 DIAGNOSIS — M9906 Segmental and somatic dysfunction of lower extremity: Secondary | ICD-10-CM | POA: Diagnosis not present

## 2022-07-28 DIAGNOSIS — M9901 Segmental and somatic dysfunction of cervical region: Secondary | ICD-10-CM | POA: Diagnosis not present

## 2022-07-28 DIAGNOSIS — M79672 Pain in left foot: Secondary | ICD-10-CM | POA: Diagnosis not present

## 2022-07-28 DIAGNOSIS — M542 Cervicalgia: Secondary | ICD-10-CM | POA: Diagnosis not present

## 2022-07-28 DIAGNOSIS — M25551 Pain in right hip: Secondary | ICD-10-CM | POA: Diagnosis not present

## 2022-07-28 DIAGNOSIS — M9903 Segmental and somatic dysfunction of lumbar region: Secondary | ICD-10-CM | POA: Diagnosis not present

## 2022-07-28 DIAGNOSIS — M9902 Segmental and somatic dysfunction of thoracic region: Secondary | ICD-10-CM | POA: Diagnosis not present

## 2022-07-28 DIAGNOSIS — M65872 Other synovitis and tenosynovitis, left ankle and foot: Secondary | ICD-10-CM | POA: Diagnosis not present

## 2022-07-28 DIAGNOSIS — M25552 Pain in left hip: Secondary | ICD-10-CM | POA: Diagnosis not present

## 2022-07-28 DIAGNOSIS — M9908 Segmental and somatic dysfunction of rib cage: Secondary | ICD-10-CM | POA: Diagnosis not present

## 2022-07-28 DIAGNOSIS — M545 Low back pain, unspecified: Secondary | ICD-10-CM | POA: Diagnosis not present

## 2022-08-03 ENCOUNTER — Telehealth: Payer: Self-pay

## 2022-08-03 ENCOUNTER — Other Ambulatory Visit: Payer: Self-pay | Admitting: Family Medicine

## 2022-08-03 DIAGNOSIS — Z1231 Encounter for screening mammogram for malignant neoplasm of breast: Secondary | ICD-10-CM

## 2022-08-03 NOTE — Telephone Encounter (Signed)
Pt lvm to reschedule colonoscopy last one in 2023 was not clear please return call

## 2022-08-03 NOTE — Telephone Encounter (Signed)
Patient has been advised that the colonoscopy referral we have in our system is from 04/09/21.  She has been advised that we will need a new referral sent over to schedule.  She said she has her physical with her pcp tomorrow and will ask to a new referral sent.  I've also sent a message to her pcp to request new referral.  Thanks,  Marcelino Duster, CMA

## 2022-08-04 ENCOUNTER — Encounter: Payer: Self-pay | Admitting: Family Medicine

## 2022-08-04 ENCOUNTER — Other Ambulatory Visit: Payer: Self-pay | Admitting: Family Medicine

## 2022-08-04 ENCOUNTER — Telehealth: Payer: Self-pay

## 2022-08-04 ENCOUNTER — Ambulatory Visit (INDEPENDENT_AMBULATORY_CARE_PROVIDER_SITE_OTHER): Payer: Medicare Other | Admitting: Family Medicine

## 2022-08-04 VITALS — BP 122/64 | HR 70 | Temp 97.8°F | Resp 17 | Ht 61.0 in | Wt 120.1 lb

## 2022-08-04 DIAGNOSIS — E785 Hyperlipidemia, unspecified: Secondary | ICD-10-CM | POA: Diagnosis not present

## 2022-08-04 DIAGNOSIS — I10 Essential (primary) hypertension: Secondary | ICD-10-CM | POA: Diagnosis not present

## 2022-08-04 DIAGNOSIS — Z1211 Encounter for screening for malignant neoplasm of colon: Secondary | ICD-10-CM

## 2022-08-04 NOTE — Progress Notes (Signed)
   Subjective:    Patient ID: Theresa Martin, female    DOB: 03-13-1956, 67 y.o.   MRN: [id number]  HPI HTN- chronic problem, on Triamterene HCTZ 37.5mg /25mg  daily w/ good control.  Denies CP, SOB above baseline, HA's above baseline, visual changes, edema.  Hyperlipidemia- chronic problem, on Lipitor 20mg  daily.  No N/V.  Ongoing abd pain- sees GI.   Review of Systems For ROS see HPI     Objective:   Physical Exam Vitals reviewed.  Constitutional:      General: She is not in acute distress.    Appearance: Normal appearance. She is well-developed.  HENT:     Head: Normocephalic and atraumatic.  Eyes:     Conjunctiva/sclera: Conjunctivae normal.     Pupils: Pupils are equal, round, and reactive to light.  Neck:     Thyroid: No thyromegaly.  Cardiovascular:     Rate and Rhythm: Normal rate and regular rhythm.     Pulses: Normal pulses.     Heart sounds: Normal heart sounds. No murmur heard. Pulmonary:     Effort: Pulmonary effort is normal. No respiratory distress.     Breath sounds: Normal breath sounds.  Abdominal:     General: There is no distension.     Palpations: Abdomen is soft.     Tenderness: There is no abdominal tenderness.  Musculoskeletal:     Cervical back: Normal range of motion and neck supple.     Right lower leg: No edema.     Left lower leg: No edema.  Lymphadenopathy:     Cervical: No cervical adenopathy.  Skin:    General: Skin is warm and dry.  Neurological:     General: No focal deficit present.     Mental Status: She is alert and oriented to person, place, and time.  Psychiatric:        Mood and Affect: Mood normal.        Behavior: Behavior normal.           Assessment & Plan:

## 2022-08-04 NOTE — Assessment & Plan Note (Signed)
Chronic problem.  On Lipitor 20mg daily.  Check labs.  Adjust meds prn  

## 2022-08-04 NOTE — Patient Instructions (Signed)
Follow up in 6 months to recheck BP and cholesterol We'll notify you of your lab results and make any changes if needed Keep up the good work on healthy diet and regular exercise- you look great! Call with any questions or concerns Stay Safe!  Stay Healthy! Happy Mother's Day!!!

## 2022-08-04 NOTE — Telephone Encounter (Signed)
Screening colonoscopy referral received from patients PCP.  Patients last colonoscopy was normal. She stated it was about 10-12 years ago.  She is experiencing the following GI Issues: constipation, swollen abdomen, food not feeling like its digesting, heartburn.  She also said her bowel movements go from constipation to diarrhea. Patient has agreed to have an office visit with our PA-Tina.  Misty Stanley has been asked to contact patient to schedule pt an office visit to discuss her GI Issues prior to having colonoscopy scheduled.  Thanks, [place], [place]

## 2022-08-04 NOTE — Assessment & Plan Note (Signed)
Chronic problem.  Currently well controlled on Triamterene HCTZ.  Asymptomatic.  Check labs due to diuretic use.  No anticipated med changes.  Will follow.

## 2022-08-05 ENCOUNTER — Telehealth: Payer: Self-pay

## 2022-08-05 LAB — CBC WITH DIFFERENTIAL/PLATELET
Basophils Absolute: 0 10*3/uL (ref 0.0–0.1)
Basophils Relative: 0.8 % (ref 0.0–3.0)
Eosinophils Absolute: 0 10*3/uL (ref 0.0–0.7)
Eosinophils Relative: 0.8 % (ref 0.0–5.0)
HCT: 36.6 % (ref 36.0–46.0)
Hemoglobin: 12.6 g/dL (ref 12.0–15.0)
Lymphocytes Relative: 25.5 % (ref 12.0–46.0)
Lymphs Abs: 1.2 10*3/uL (ref 0.7–4.0)
MCHC: 34.5 g/dL (ref 30.0–36.0)
MCV: 90.8 fl (ref 78.0–100.0)
Monocytes Absolute: 0.5 10*3/uL (ref 0.1–1.0)
Monocytes Relative: 9.9 % (ref 3.0–12.0)
Neutro Abs: 2.9 10*3/uL (ref 1.4–7.7)
Neutrophils Relative %: 63 % (ref 43.0–77.0)
Platelets: 262 10*3/uL (ref 150.0–400.0)
RBC: 4.03 Mil/uL (ref 3.87–5.11)
RDW: 12.8 % (ref 11.5–15.5)
WBC: 4.6 10*3/uL (ref 4.0–10.5)

## 2022-08-05 LAB — HEPATIC FUNCTION PANEL
ALT: 23 U/L (ref 0–35)
AST: 30 U/L (ref 0–37)
Albumin: 4.3 g/dL (ref 3.5–5.2)
Alkaline Phosphatase: 70 U/L (ref 39–117)
Bilirubin, Direct: 0.1 mg/dL (ref 0.0–0.3)
Total Bilirubin: 0.3 mg/dL (ref 0.2–1.2)
Total Protein: 7.3 g/dL (ref 6.0–8.3)

## 2022-08-05 LAB — LIPID PANEL
Cholesterol: 165 mg/dL (ref 0–200)
HDL: 69 mg/dL (ref >39.00)
LDL Cholesterol: 85 mg/dL (ref 0–99)
NonHDL: 96.09
Total CHOL/HDL Ratio: 2
Triglycerides: 53 mg/dL (ref 0.0–149.0)
VLDL: 10.6 mg/dL (ref 0.0–40.0)

## 2022-08-05 LAB — BASIC METABOLIC PANEL
BUN: 14 mg/dL (ref 6–23)
CO2: 30 mEq/L (ref 19–32)
Calcium: 9.2 mg/dL (ref 8.4–10.5)
Chloride: 97 mEq/L (ref 96–112)
Creatinine, Ser: 0.71 mg/dL (ref 0.40–1.20)
GFR: 87.88 mL/min (ref >60.00)
Glucose, Bld: 82 mg/dL (ref 70–99)
Potassium: 4.1 mEq/L (ref 3.5–5.1)
Sodium: 135 mEq/L (ref 135–145)

## 2022-08-05 LAB — TSH: TSH: 0.82 u[IU]/mL (ref 0.35–5.50)

## 2022-08-05 NOTE — Telephone Encounter (Signed)
-----   Message from Sheliah Hatch, MD sent at 08/05/2022  3:09 PM EDT ----- Labs look great!  No changes at this time

## 2022-08-05 NOTE — Telephone Encounter (Signed)
Pt aware of lab results 

## 2022-08-06 ENCOUNTER — Telehealth: Payer: Self-pay | Admitting: Internal Medicine

## 2022-08-06 DIAGNOSIS — F411 Generalized anxiety disorder: Secondary | ICD-10-CM | POA: Diagnosis not present

## 2022-08-06 DIAGNOSIS — F3132 Bipolar disorder, current episode depressed, moderate: Secondary | ICD-10-CM | POA: Diagnosis not present

## 2022-08-06 NOTE — Telephone Encounter (Signed)
Josh states patient's liter flow for oxygen needs to be changed. Also would like to know if patient needs to still use oxygen. Josh phone number is [phone].

## 2022-08-06 NOTE — Telephone Encounter (Addendum)
I spoke with the patient. She just received a new oxygen concentrator. She needed to know how many liters of O2 she was suppose to be on at night.   Per Dr. Clovis Fredrickson message from 01/18/2022- Patient is prescribed nocturnal o2 at 1L.   I notified the patient. I did go ahead and schedule her a follow up appt with Dr. Belia Heman since her last visit was on 12/18/2020.   Nothing further needed.

## 2022-08-09 ENCOUNTER — Other Ambulatory Visit (HOSPITAL_COMMUNITY): Payer: Self-pay

## 2022-08-09 ENCOUNTER — Telehealth: Payer: Self-pay

## 2022-08-09 NOTE — Telephone Encounter (Signed)
Patient Advocate Encounter   Received notification from [employer] that prior authorization is required for AJOVY (fremanezumab-vfrm) injection 225MG /1.5ML auto-injectors   Submitted: 08-09-2022 Key [id number]  Status is pending

## 2022-08-09 NOTE — Telephone Encounter (Signed)
Patient Advocate Encounter   Received notification from [employer] that prior authorization is required for Nurtec 75MG  dispersible tablets   Submitted: 08-09-2022 Key [id number]  Status is pending

## 2022-08-10 ENCOUNTER — Other Ambulatory Visit (HOSPITAL_COMMUNITY): Payer: Self-pay

## 2022-08-10 NOTE — Telephone Encounter (Signed)
Pharmacy Patient Advocate Encounter  Prior Authorization for AJOVY (fremanezumab-vfrm) injection 225MG /1.5ML auto-injectors has been approved by [employer] (ins).    PA # PA Case ID: [id number]  Effective dates: 08/09/2022 through 08/09/2023  Copay is $24.98 per test claim

## 2022-08-10 NOTE — Telephone Encounter (Signed)
Pharmacy Patient Advocate Encounter  Received notification from [employer] that the request for prior authorization for Nurtec 75MG  dispersible tablets has been denied due to .    Please be advised we currently do not have a Pharmacist to review denials, therefore you will need to process appeals accordingly as needed. Thanks for your support at this time.   You may call  or fax , to appeal.    Denial letter has been placed into the chart

## 2022-08-11 ENCOUNTER — Other Ambulatory Visit: Payer: Self-pay | Admitting: Diagnostic Neuroimaging

## 2022-08-11 NOTE — Telephone Encounter (Signed)
Insurance wont cover because she hasn't tried and failed two triptans. Do you want to try something else for rescue ?

## 2022-08-13 MED ORDER — ELETRIPTAN HYDROBROMIDE 40 MG PO TABS: 40.0000 mg | ORAL_TABLET | ORAL | 0 refills | Status: DC | PRN

## 2022-08-13 NOTE — Telephone Encounter (Signed)
Has tried rizatriptan. Can try eletriptan.  Meds ordered this encounter  Medications   eletriptan (RELPAX) 40 MG tablet    Sig: Take 1 tablet (40 mg total) by mouth as needed for migraine or headache. May repeat in 2 hours if headache persists or recurs.    Dispense:  10 tablet    Refill:  0   Suanne Marker, MD 08/13/2022, 9:42 AM Certified in Neurology, Neurophysiology and Neuroimaging  [facility] [address], [address] [place], [place] [address] [phone]

## 2022-08-16 ENCOUNTER — Other Ambulatory Visit: Payer: Self-pay | Admitting: Diagnostic Neuroimaging

## 2022-08-16 ENCOUNTER — Other Ambulatory Visit: Payer: Self-pay

## 2022-08-16 ENCOUNTER — Ambulatory Visit (INDEPENDENT_AMBULATORY_CARE_PROVIDER_SITE_OTHER): Payer: Medicare Other | Admitting: Physician Assistant

## 2022-08-16 ENCOUNTER — Encounter: Payer: Self-pay | Admitting: Physician Assistant

## 2022-08-16 VITALS — BP 129/75 | HR 75 | Temp 97.6°F | Ht 61.0 in | Wt 119.0 lb

## 2022-08-16 DIAGNOSIS — K219 Gastro-esophageal reflux disease without esophagitis: Secondary | ICD-10-CM

## 2022-08-16 DIAGNOSIS — K5909 Other constipation: Secondary | ICD-10-CM | POA: Diagnosis not present

## 2022-08-16 DIAGNOSIS — R1310 Dysphagia, unspecified: Secondary | ICD-10-CM | POA: Diagnosis not present

## 2022-08-16 DIAGNOSIS — Z800 Family history of malignant neoplasm of digestive organs: Secondary | ICD-10-CM | POA: Diagnosis not present

## 2022-08-16 MED ORDER — POLYETHYLENE GLYCOL 3350 17 GM/SCOOP PO POWD: 1.0000 | Freq: Once | ORAL | 0 refills | Status: AC

## 2022-08-16 MED ORDER — PEG 3350-KCL-NA BICARB-NACL 420 G PO SOLR: 4000.0000 mL | Freq: Once | ORAL | 0 refills | Status: AC

## 2022-08-16 NOTE — Progress Notes (Signed)
Celso Amy, PA-C [address]  [address]  [place], [place] [address]  Main: [phone]  Fax: [phone]   Gastroenterology Consultation  Referring Provider:     Sheliah Hatch, MD Primary Care Physician:  Sheliah Hatch, MD Primary Gastroenterologist:  Dr. Wyline Mood / Celso Amy, PA-C  Reason for Consultation:     Constipation, diarrhea, swollen abdomen, heartburn        HPI:   Theresa Martin is a 67 y.o. y/o female referred for consultation & management  by Sheliah Hatch, MD.    Last colonoscopy done 12/2009 by Dr. Jarold Motto at [facility] showed no polyps.  Repeat screening colonoscopy in 10 years, which is overdue.  Her brother had colon cancer.  She has history of chronic functional constipation since at least 2011.  Patient states she continues to have constipation.  Feels like her abdomen is very swollen and tight.  She tried MiraLAX which caused loose stools and fecal incontinence.  She takes occasional OTC Dulcolax laxative as needed.  Once a week with hard stools and straining.  Has not tried any prescription treatment.  She denies rectal bleeding, abdominal pain, or unintentional weight loss.  She has also had some intermittent heartburn and mild vague solid food dysphagia.  Feels like food goes down slowly.  Has tried OTC Pepcid which helps mildly.  No previous EGD.    Past Medical History:  Diagnosis Date   Allergy    Anxiety    Asthma    Depression    Hyperlipidemia    Hypertension    Migraines    Osteopenia after menopause    Persistent headaches    Pneumonia 2020   Postherpetic neuralgia    Renal disorder    Shingles outbreak 02/2014   Skin cancer    moles on right groin and right buttock.    Past Surgical History:  Procedure Laterality Date   CARDIOVASCULAR STRESS TEST  02/2009   treadmill stress test: Low risk   CESAREAN SECTION  1987, 1989   COLONOSCOPY     2014/2015 [facility], normal   COLONOSCOPY WITH  PROPOFOL N/A 10/20/2021   Procedure: COLONOSCOPY WITH PROPOFOL;  Surgeon: Wyline Mood, MD;  Location: [facility] ENDOSCOPY;  Service: Gastroenterology;  Laterality: N/A;   DIAGNOSTIC LAPAROSCOPY     EXPLORATORY LAPAROTOMY     laproscopy for infertility   EYE SURGERY Bilateral    laser-correct opening b/tn cornea and iris   MOLE REMOVAL  2017   x 2 moles    Prior to Admission medications   Medication Sig Start Date End Date Taking? Authorizing Provider  albuterol (VENTOLIN HFA) 108 (90 Base) MCG/ACT inhaler TAKE 2 PUFFS BY MOUTH EVERY 6 HOURS AS NEEDED FOR WHEEZE OR SHORTNESS OF BREATH 07/29/21   Sheliah Hatch, MD  atorvastatin (LIPITOR) 20 MG tablet TAKE 1 TABLET BY MOUTH EVERY DAY 03/12/22   Sheliah Hatch, MD  budesonide-formoterol [facility]) 160-4.5 MCG/ACT inhaler Inhale 2 puffs into the lungs 2 (two) times daily. in the morning and at bedtime. 12/18/20   Parrett, Virgel Bouquet, NP  Cholecalciferol (VITAMIN D3 PO) Take 5,000 Units by mouth.    [provider]  clonazePAM (KLONOPIN) 0.5 MG tablet Take 0.5 mg by mouth daily as needed. 09/06/19   [provider]  diclofenac Sodium (VOLTAREN) 1 % GEL Apply topically 4 (four) times daily.    [provider]  ELDERBERRY PO Take by mouth.    [provider]  eletriptan (RELPAX) 40 MG tablet Take 1 tablet (40 mg total) by mouth as needed for migraine or headache. May repeat in 2 hours if headache persists or recurs. 08/13/22   Penumalli, Glenford Bayley, MD  escitalopram (LEXAPRO) 10 MG tablet Take 10 mg by mouth daily. 07/03/19   [provider]  Fremanezumab-vfrm (AJOVY) 225 MG/1.5ML SOAJ INJECT 225 MG INTO THE SKIN EVERY 30 (THIRTY) DAYS. 08/12/22   Penumalli, Glenford Bayley, MD  gabapentin (NEURONTIN) 400 MG capsule TAKE 1 CAPSULE BY MOUTH 2 TIMES DAILY. 09/08/21   Penumalli, Glenford Bayley, MD  Glucosamine-Chondroitin (MOVE FREE PO) Take by mouth daily.    [provider]  latanoprost (XALATAN) 0.005 % ophthalmic  solution Place 1 drop into both eyes at bedtime. 09/05/19   Sheliah Hatch, MD  magnesium (MAGTAB) 84 MG ( ) TBCR SR tablet Take 84 mg by mouth.    [provider]  Misc Natural Products (JOINT HEALTH PO) Take by mouth.    [provider]  montelukast (SINGULAIR) 10 MG tablet TAKE 1 TABLET BY MOUTH EVERYDAY AT BEDTIME 05/13/22   Sheliah Hatch, MD  Multiple Vitamins-Minerals (WOMENS MULTIVITAMIN PO) Take by mouth.    [provider]  Probiotic Product (PROBIOTIC PO) Take by mouth.    [provider]  QUEtiapine (SEROQUEL XR) 400 MG 24 hr tablet Take 1 tablet (400 mg total) by mouth at bedtime. Patient taking differently: Take 800 mg by mouth at bedtime. 02/15/19   Money, Gerlene Burdock, FNP  Rimegepant Sulfate (NURTEC) 75 MG TBDP Take 1 tablet (75 mg total) by mouth as needed (for breakthrough migraine). 05/26/22   Penumalli, Glenford Bayley, MD  rizatriptan (MAXALT-MLT) 10 MG disintegrating tablet Take 1 tablet (10 mg total) by mouth as needed for migraine (May repeat in 2 hours if needed). 05/26/22   Penumalli, Glenford Bayley, MD  Spacer/Aero-Holding Chambers (AEROCHAMBER PLUS) inhaler Use as instructed 01/13/21   Erin Fulling, MD  temazepam (RESTORIL) 15 MG capsule Take 1 capsule (15 mg total) by mouth at bedtime. 02/15/19   Money, Gerlene Burdock, FNP  triamterene-hydrochlorothiazide (MAXZIDE-25) 37.5-25 MG tablet TAKE 1 TABLET BY MOUTH EVERY DAY 03/18/22   Sheliah Hatch, MD  Turmeric (QC TUMERIC COMPLEX) 500 MG CAPS Take by mouth.    [provider]  UNABLE TO FIND Med Name: magnesium L theronate  650 mg at [facility]    [provider]  zinc gluconate 50 MG tablet Take 50 mg by mouth daily.    [provider]    Family History  Problem Relation Age of Onset   Hypertension Mother    Coronary artery disease Mother    Heart failure Mother    Transient ischemic attack Mother    Hypertension Father    Liver disease Sister        liver failure    Migraines Sister    Arrhythmia Brother    Hypertension Brother    Cancer Neg Hx        colon    Breast cancer Neg Hx      Social History   Tobacco Use   Smoking status: Never   Smokeless tobacco: Never  Vaping Use   Vaping Use: Never used  Substance Use Topics   Alcohol use: No    Alcohol/week: 0.0 standard drinks of alcohol   Drug use: No    Allergies as of 08/16/2022   (No Known Allergies)    Review of Systems:    All systems reviewed and  negative except where noted in HPI.   Physical Exam:  There were no vitals taken for this visit. No LMP recorded. Patient is postmenopausal. Psych:  Alert and cooperative. Normal mood and affect. General:   Alert,  Well-developed, well-nourished, pleasant and cooperative in NAD Head:  Normocephalic and atraumatic. Eyes:  Sclera clear, no icterus.   Conjunctiva pink. Neck:  Supple; no masses or thyromegaly. Lungs:  Respirations even and unlabored.  Clear throughout to auscultation.   No wheezes, crackles, or rhonchi. No acute distress. Heart:  Regular rate and rhythm; no murmurs, clicks, rubs, or gallops. Abdomen:  Normal bowel sounds.  No bruits.  Soft, and non-distended without masses, hepatosplenomegaly or hernias noted.  No Tenderness.  No guarding or rebound tenderness.    Neurologic:  Alert and oriented x3;  grossly normal neurologically. Psych:  Alert and cooperative. Normal mood and affect.  Imaging Studies: No results found.  Assessment and Plan:   Theresa Martin is a 67 y.o. y/o female has been referred for   Chronic Constipation  Gave samples of Linzess 72 mcg QD for 1 week, then 145 mcg QD for 1 week.  She will let me know which dose works best, and then she can call me back for a prescription.   GERD  Recommend Lifestyle Modifications to prevent Acid Reflux.  Rec. Avoid coffee, sodas, peppermint, citrus fruits, and spicey foods.  Avoid eating 2-3 hours before bedtime.   Dysphagia  Scheduling EGD I discussed  risks of EGD with patient to include risk of bleeding, perforation, and risk of sedation.   Patient expressed understanding and agrees to proceed with EGD.   Family History of Colon Cancer - Brother  Scheduling Colonoscopy I discussed risks of colonoscopy with patient to include risk of bleeding, colon perforation, and risk of sedation.   Patient expressed understanding and agrees to proceed with colonoscopy.   Follow up in 4 weeks after procedures.  Celso Amy, PA-C

## 2022-08-16 NOTE — Telephone Encounter (Signed)
PA for Relpax submitted via CMM Key: [id number]. Your information has been submitted to [employer], awaiting determination.

## 2022-08-16 NOTE — Patient Instructions (Signed)
Patient will call office after she looks at her calendar and let me know dates of when to schedule Colonoscopy /Endoscopy.

## 2022-08-17 ENCOUNTER — Telehealth: Payer: Self-pay

## 2022-08-17 ENCOUNTER — Other Ambulatory Visit: Payer: Self-pay

## 2022-08-17 MED ORDER — POLYETHYLENE GLYCOL 3350 17 GM/SCOOP PO POWD: 1.0000 | Freq: Once | ORAL | 0 refills | Status: AC

## 2022-08-17 MED ORDER — AJOVY 225 MG/1.5ML ~~LOC~~ SOAJ: 225.0000 mg | SUBCUTANEOUS | 1 refills | Status: DC

## 2022-08-17 NOTE — Telephone Encounter (Signed)
Spoke with patient to let her know EGD/Colonoscopy scheduled 10/06/22 and that to read over her 2 day bowel prep instructions and to call if she has questions. Golytely and Miralax sent to pharmacy.

## 2022-08-24 ENCOUNTER — Telehealth: Payer: Self-pay

## 2022-08-24 ENCOUNTER — Encounter: Payer: Self-pay | Admitting: Family Medicine

## 2022-08-24 DIAGNOSIS — M9907 Segmental and somatic dysfunction of upper extremity: Secondary | ICD-10-CM | POA: Diagnosis not present

## 2022-08-24 DIAGNOSIS — M545 Low back pain, unspecified: Secondary | ICD-10-CM | POA: Diagnosis not present

## 2022-08-24 DIAGNOSIS — M546 Pain in thoracic spine: Secondary | ICD-10-CM | POA: Diagnosis not present

## 2022-08-24 DIAGNOSIS — M9908 Segmental and somatic dysfunction of rib cage: Secondary | ICD-10-CM | POA: Diagnosis not present

## 2022-08-24 DIAGNOSIS — M9902 Segmental and somatic dysfunction of thoracic region: Secondary | ICD-10-CM | POA: Diagnosis not present

## 2022-08-24 DIAGNOSIS — M9906 Segmental and somatic dysfunction of lower extremity: Secondary | ICD-10-CM | POA: Diagnosis not present

## 2022-08-24 DIAGNOSIS — M25551 Pain in right hip: Secondary | ICD-10-CM | POA: Diagnosis not present

## 2022-08-24 NOTE — Telephone Encounter (Signed)
Spoke with patient and she is good to go for procedure on 09/02/22,she will call me when she gets home to change the bowel prep dates from 10/06/22 to reflect correct procedure date of 09/02/22

## 2022-08-24 NOTE — Telephone Encounter (Signed)
Pt lmovm requesting call back regarding her upcoming procedure schedule for what she thought was July 10th, but in Mychart it says June 6th... Patient would like a call back to confirm date

## 2022-08-25 ENCOUNTER — Telehealth: Payer: Self-pay

## 2022-08-25 NOTE — Telephone Encounter (Signed)
Spoke with patient and confirmed procedure dates- also she wanted to let tina lnow that she had 2 bowel movements when starting the 72 mcg Linzess and has not had a bowel movement in 4 days now and started the Linzess today-she was instructed to drink 64 ounces of water at least daily and if no bowel movement by Friday to call and we can provide samples of 290 mcg Linzess prior to calling in prescription.

## 2022-08-26 ENCOUNTER — Other Ambulatory Visit: Payer: Self-pay

## 2022-08-26 ENCOUNTER — Telehealth: Payer: Self-pay

## 2022-08-26 NOTE — Telephone Encounter (Signed)
We received return colonoscopy instructions that were mailed to patient for 09/02/2022. Please confirm patient address and re mail them or have patient review them on mychart

## 2022-08-26 NOTE — Telephone Encounter (Signed)
Pt left vm regarding her instructions for her colonoscopy that is scheduled for June 6th. Please call pt back.

## 2022-09-01 ENCOUNTER — Encounter: Payer: Self-pay | Admitting: Gastroenterology

## 2022-09-01 ENCOUNTER — Telehealth: Payer: Self-pay

## 2022-09-01 NOTE — Telephone Encounter (Signed)
Patient is calling because she is having a colonoscopy tomorrow and is doing a 2 day prep. She states that she is not had a bowel movement since Monday. She states she is taking Linzess daily, Dulcolax 2 times daily, and did the Miralax prep yesterday. She states she drinks 3 bottles of water daily. Talk to [name] she said to keep the colonoscopy as schedule and she needs to drink at least 64 oz of liquids daily 2 dulcolax 2 times daily. She can also do a fleet enema. She states she will do this and if she does not have a bowel movement by 3 she will call us back

## 2022-09-02 ENCOUNTER — Other Ambulatory Visit: Payer: Self-pay

## 2022-09-02 ENCOUNTER — Encounter
Admission: RE | Disposition: A | Discharge status: Home / Self Care | Payer: Self-pay | Source: Home / Self Care | Attending: Gastroenterology

## 2022-09-02 ENCOUNTER — Ambulatory Visit: Payer: Medicare Other | Admitting: Certified Registered"

## 2022-09-02 ENCOUNTER — Encounter: Payer: Self-pay | Admitting: Gastroenterology

## 2022-09-02 ENCOUNTER — Ambulatory Visit
Admission: RE | Admit: 2022-09-02 | Discharge: 2022-09-02 | Disposition: A | Discharge status: Home / Self Care | Payer: Medicare Other | Attending: Gastroenterology | Admitting: Gastroenterology

## 2022-09-02 DIAGNOSIS — K259 Gastric ulcer, unspecified as acute or chronic, without hemorrhage or perforation: Secondary | ICD-10-CM | POA: Diagnosis not present

## 2022-09-02 DIAGNOSIS — K5909 Other constipation: Secondary | ICD-10-CM | POA: Diagnosis not present

## 2022-09-02 DIAGNOSIS — K3189 Other diseases of stomach and duodenum: Secondary | ICD-10-CM | POA: Insufficient documentation

## 2022-09-02 DIAGNOSIS — Z1211 Encounter for screening for malignant neoplasm of colon: Secondary | ICD-10-CM | POA: Diagnosis not present

## 2022-09-02 DIAGNOSIS — N189 Chronic kidney disease, unspecified: Secondary | ICD-10-CM | POA: Diagnosis not present

## 2022-09-02 DIAGNOSIS — Z800 Family history of malignant neoplasm of digestive organs: Secondary | ICD-10-CM | POA: Diagnosis not present

## 2022-09-02 DIAGNOSIS — I129 Hypertensive chronic kidney disease with stage 1 through stage 4 chronic kidney disease, or unspecified chronic kidney disease: Secondary | ICD-10-CM | POA: Diagnosis not present

## 2022-09-02 DIAGNOSIS — K219 Gastro-esophageal reflux disease without esophagitis: Secondary | ICD-10-CM

## 2022-09-02 DIAGNOSIS — R1314 Dysphagia, pharyngoesophageal phase: Secondary | ICD-10-CM | POA: Diagnosis not present

## 2022-09-02 DIAGNOSIS — R1310 Dysphagia, unspecified: Secondary | ICD-10-CM | POA: Diagnosis not present

## 2022-09-02 HISTORY — PX: COLONOSCOPY WITH PROPOFOL: SHX5780

## 2022-09-02 HISTORY — PX: ESOPHAGOGASTRODUODENOSCOPY (EGD) WITH PROPOFOL: SHX5813

## 2022-09-02 SURGERY — COLONOSCOPY WITH PROPOFOL
Anesthesia: General

## 2022-09-02 MED ORDER — ONDANSETRON HCL 4 MG/2ML IJ SOLN
INTRAMUSCULAR | Status: DC | PRN
  Administered 2022-09-02: 4 mg via INTRAVENOUS

## 2022-09-02 MED ORDER — GLYCOPYRROLATE 0.2 MG/ML IJ SOLN
INTRAMUSCULAR | Status: DC | PRN
  Administered 2022-09-02: .2 mg via INTRAVENOUS

## 2022-09-02 MED ORDER — PROPOFOL 500 MG/50ML IV EMUL
INTRAVENOUS | Status: DC | PRN
  Administered 2022-09-02: 10 mg via INTRAVENOUS
  Administered 2022-09-02: 40 mg via INTRAVENOUS
  Administered 2022-09-02: 150 ug/kg/min via INTRAVENOUS
  Administered 2022-09-02: 10 mg via INTRAVENOUS
  Administered 2022-09-02: 50 mg via INTRAVENOUS

## 2022-09-02 MED ORDER — SODIUM CHLORIDE 0.9 % IV SOLN: INTRAVENOUS | Status: DC | PRN

## 2022-09-02 MED ORDER — DEXMEDETOMIDINE HCL IN NACL 200 MCG/50ML IV SOLN
INTRAVENOUS | Status: DC | PRN
  Administered 2022-09-02: 8 ug via INTRAVENOUS

## 2022-09-02 MED ORDER — SODIUM CHLORIDE 0.9 % IV SOLN: INTRAVENOUS | Status: DC

## 2022-09-02 MED ORDER — PROPOFOL 10 MG/ML IV BOLUS
INTRAVENOUS | Status: AC
  Filled 2022-09-02: qty 20

## 2022-09-02 MED ORDER — LIDOCAINE HCL (CARDIAC) PF 100 MG/5ML IV SOSY
PREFILLED_SYRINGE | INTRAVENOUS | Status: DC | PRN
  Administered 2022-09-02: 40 mg via INTRAVENOUS

## 2022-09-02 NOTE — Op Note (Signed)
[facility] Gastroenterology Patient Name: Beckett Vecchiarelli Procedure Date: 09/02/2022 1:08 PM MRN: [id number] Account #: [id number] Date of Birth: [birth year] Admit Type: Outpatient Age: 67 Room: [facility] ENDO ROOM 3 Gender: Female Note Status: Finalized Instrument Name: Peds Colonoscope [id number] Procedure:             Colonoscopy Indications:           Colon cancer screening in patient at increased risk:                         Colorectal cancer in brother Providers:             Toney Reil MD, MD Referring MD:          Helane Rima. Beverely Low, MD (Referring MD) Medicines:             General Anesthesia Complications:         No immediate complications. Estimated blood loss: None. Procedure:             Pre-Anesthesia Assessment:                        - Prior to the procedure, a History and Physical was                         performed, and patient medications and allergies were                         reviewed. The patient is competent. The risks and                         benefits of the procedure and the sedation options and                         risks were discussed with the patient. All questions                         were answered and informed consent was obtained.                         Patient identification and proposed procedure were                         verified by the physician, the nurse, the                         anesthesiologist, the anesthetist and the technician                         in the pre-procedure area in the procedure room in the                         endoscopy suite. Mental Status Examination: normal.                         Airway Examination: normal oropharyngeal airway and                         neck mobility. Respiratory Examination: clear to  auscultation. CV Examination: normal. Prophylactic                         Antibiotics: The patient does not require prophylactic                          antibiotics. Prior Anticoagulants: The patient has                         taken no anticoagulant or antiplatelet agents. ASA                         Grade Assessment: III - A patient with severe systemic                         disease. After reviewing the risks and benefits, the                         patient was deemed in satisfactory condition to                         undergo the procedure. The anesthesia plan was to use                         general anesthesia. Immediately prior to                         administration of medications, the patient was                         re-assessed for adequacy to receive sedatives. The                         heart rate, respiratory rate, oxygen saturations,                         blood pressure, adequacy of pulmonary ventilation, and                         response to care were monitored throughout the                         procedure. The physical status of the patient was                         re-assessed after the procedure.                        After obtaining informed consent, the colonoscope was                         passed under direct vision. Throughout the procedure,                         the patient's blood pressure, pulse, and oxygen                         saturations were monitored continuously. The  Colonoscope was introduced through the anus and                         advanced to the the cecum, identified by appendiceal                         orifice and ileocecal valve. The colonoscopy was                         performed without difficulty. The patient tolerated                         the procedure well. The quality of the bowel                         preparation was evaluated using the BBPS [facility] Bowel                         Preparation Scale) with scores of: Right Colon = 3,                         Transverse Colon = 3 and Left Colon = 3 (entire mucosa                         seen  well with no residual staining, small fragments                         of stool or opaque liquid). The total BBPS score                         equals 9. The ileocecal valve, appendiceal orifice,                         and rectum were photographed. Findings:      The perianal and digital rectal examinations were normal. Pertinent       negatives include normal sphincter tone and no palpable rectal lesions.      The entire examined colon appeared normal. Impression:            - The entire examined colon is normal.                        - No specimens collected. Recommendation:        - Discharge patient to home (with escort).                        - Resume previous diet today.                        - Continue present medications.                        - Repeat colonoscopy in 5 years for screening purposes. Procedure Code(s):     --- Professional ---                        Z6109, Colorectal cancer screening; colonoscopy on  individual at high risk Diagnosis Code(s):     --- Professional ---                        Z80.0, Family history of malignant neoplasm of                         digestive organs CPT copyright 2022 American Medical Association. All rights reserved. The codes documented in this report are preliminary and upon coder review may  be revised to meet current compliance requirements. Dr. Libby Maw Toney Reil MD, MD 09/02/2022 2:59:27 PM This report has been signed electronically. Number of Addenda: 0 Note Initiated On: 09/02/2022 1:08 PM Scope Withdrawal Time: 0 hours 12 minutes 36 seconds  Total Procedure Duration: 0 hours 20 minutes 28 seconds  Estimated Blood Loss:  Estimated blood loss: none.      [facility]

## 2022-09-02 NOTE — Anesthesia Preprocedure Evaluation (Signed)
Anesthesia Evaluation  Patient identified by MRN, date of birth, ID band Patient awake    Reviewed: Allergy & Precautions, NPO status , Patient's Chart, lab work & pertinent test results  History of Anesthesia Complications Negative for: history of anesthetic complications  Airway Mallampati: II  TM Distance: >3 FB Neck ROM: full    Dental  (+) Dental Advidsory Given   Pulmonary neg pulmonary ROS, neg shortness of breath, asthma    Pulmonary exam normal        Cardiovascular hypertension, On Medications (-) angina negative cardio ROS Normal cardiovascular exam     Neuro/Psych  PSYCHIATRIC DISORDERS Anxiety Depression    negative neurological ROS     GI/Hepatic Neg liver ROS,GERD  Medicated,,  Endo/Other  negative endocrine ROS    Renal/GU Renal disease  negative genitourinary   Musculoskeletal   Abdominal   Peds  Hematology negative hematology ROS (+)   Anesthesia Other Findings Past Medical History: No date: Allergy No date: Anxiety No date: Asthma No date: Depression No date: Hyperlipidemia No date: Hypertension No date: Migraines No date: Osteopenia after menopause No date: Persistent headaches 2020: Pneumonia No date: Postherpetic neuralgia No date: Renal disorder 02/2014: Shingles outbreak No date: Skin cancer     Comment:  moles on right groin and right buttock.  Past Surgical History: 02/2009: CARDIOVASCULAR STRESS TEST     Comment:  treadmill stress test: Low risk 1987, 1989: CESAREAN SECTION No date: COLONOSCOPY     Comment:  2014/2015 [facility], normal 10/20/2021: COLONOSCOPY WITH PROPOFOL; N/A     Comment:  Procedure: COLONOSCOPY WITH PROPOFOL;  Surgeon: Wyline Mood, MD;  Location: [facility] ENDOSCOPY;  Service:               Gastroenterology;  Laterality: N/A; No date: DIAGNOSTIC LAPAROSCOPY No date: EXPLORATORY LAPAROTOMY     Comment:  laproscopy for infertility No date:  EYE SURGERY; Bilateral     Comment:  laser-correct opening b/tn cornea and iris 2017: MOLE REMOVAL     Comment:  x 2 moles  BMI    Body Mass Index: 22.86 kg/m      Reproductive/Obstetrics negative OB ROS                             Anesthesia Physical Anesthesia Plan  ASA: 3  Anesthesia Plan: General   Post-op Pain Management:    Induction: Intravenous  PONV Risk Score and Plan: Propofol infusion and TIVA  Airway Management Planned: Natural Airway and Nasal Cannula  Additional Equipment:   Intra-op Plan:   Post-operative Plan:   Informed Consent: I have reviewed the patients History and Physical, chart, labs and discussed the procedure including the risks, benefits and alternatives for the proposed anesthesia with the patient or authorized representative who has indicated his/her understanding and acceptance.     Dental Advisory Given  Plan Discussed with: Anesthesiologist, CRNA and Surgeon  Anesthesia Plan Comments: (Patient consented for risks of anesthesia including but not limited to:  - adverse reactions to medications - risk of airway placement if required - damage to eyes, teeth, lips or other oral mucosa - nerve damage due to positioning  - sore throat or hoarseness - Damage to heart, brain, nerves, lungs, other parts of body or loss of life  Patient voiced understanding.)        Anesthesia Quick Evaluation

## 2022-09-02 NOTE — Op Note (Signed)
[facility] Patient Name: Theresa Martin Procedure Date: 09/02/2022 1:15 PM MRN: [id number] Account #: [id number] Date of Birth: 1956/02/02 Admit Type: Outpatient Age: 67 Room: [facility] ENDO ROOM 3 Gender: Female Note Status: Finalized Instrument Name: Patton Salles Endoscope [id number] Procedure:             Upper GI endoscopy Indications:           Esophageal dysphagia, Follow-up of gastro-esophageal                         reflux disease Providers:             Toney Reil MD, MD Referring MD:          Helane Rima. Beverely Low, MD (Referring MD) Medicines:             General Anesthesia Complications:         No immediate complications. Estimated blood loss: None. Procedure:             Pre-Anesthesia Assessment:                        - Prior to the procedure, a History and Physical was                         performed, and patient medications and allergies were                         reviewed. The patient is competent. The risks and                         benefits of the procedure and the sedation options and                         risks were discussed with the patient. All questions                         were answered and informed consent was obtained.                         Patient identification and proposed procedure were                         verified by the physician, the nurse, the                         anesthesiologist, the anesthetist and the technician                         in the pre-procedure area in the procedure room in the                         endoscopy suite. Mental Status Examination: alert and                         oriented. Airway Examination: normal oropharyngeal                         airway and neck mobility. Respiratory Examination:  clear to auscultation. CV Examination: normal.                         Prophylactic Antibiotics: The patient does not require                          prophylactic antibiotics. Prior Anticoagulants: The                         patient has taken no anticoagulant or antiplatelet                         agents. ASA Grade Assessment: II - A patient with mild                         systemic disease. After reviewing the risks and                         benefits, the patient was deemed in satisfactory                         condition to undergo the procedure. The anesthesia                         plan was to use general anesthesia. Immediately prior                         to administration of medications, the patient was                         re-assessed for adequacy to receive sedatives. The                         heart rate, respiratory rate, oxygen saturations,                         blood pressure, adequacy of pulmonary ventilation, and                         response to care were monitored throughout the                         procedure. The physical status of the patient was                         re-assessed after the procedure.                        After obtaining informed consent, the endoscope was                         passed under direct vision. Throughout the procedure,                         the patient's blood pressure, pulse, and oxygen                         saturations were monitored continuously. The Endoscope  was introduced through the mouth, and advanced to the                         second part of duodenum. The upper GI endoscopy was                         accomplished without difficulty. The patient tolerated                         the procedure well. Findings:      The duodenal bulb and second portion of the duodenum were normal.      Multiple localized small erosions with stigmata of recent bleeding were       found in the gastric body and in the gastric antrum. Biopsies were taken       with a cold forceps for Helicobacter pylori testing.      The cardia and gastric fundus  were normal on retroflexion.      Esophagogastric landmarks were identified: the gastroesophageal junction       was found at 38 cm from the incisors.      The gastroesophageal junction and examined esophagus were normal.       Biopsies were taken with a cold forceps for histology. Impression:            - Normal duodenal bulb and second portion of the                         duodenum.                        - Erosive gastropathy with stigmata of recent                         bleeding. Biopsied.                        - Esophagogastric landmarks identified.                        - Normal gastroesophageal junction and esophagus.                         Biopsied. Recommendation:        - Await pathology results.                        - Proceed with colonoscopy as scheduled                        See colonoscopy report Procedure Code(s):     --- Professional ---                        [id number], Esophagogastroduodenoscopy, flexible,                         transoral; with biopsy, single or multiple Diagnosis Code(s):     --- Professional ---                        K92.2, Gastrointestinal hemorrhage, unspecified  R13.14, Dysphagia, pharyngoesophageal phase                        K21.9, Gastro-esophageal reflux disease without                         esophagitis CPT copyright 2022 American Medical Association. All rights reserved. The codes documented in this report are preliminary and upon coder review may  be revised to meet current compliance requirements. Dr. Libby Maw Toney Reil MD, MD 09/02/2022 2:35:12 PM This report has been signed electronically. Number of Addenda: 0 Note Initiated On: 09/02/2022 1:15 PM Estimated Blood Loss:  Estimated blood loss: none.      [facility]

## 2022-09-02 NOTE — H&P (Signed)
Arlyss Repress, MD [address]  [address]  [place], [place] [address]  Main: [phone]  Fax: [phone] Pager: [phone]  Primary Care Physician:  Sheliah Hatch, MD Primary Gastroenterologist:  Dr. Arlyss Repress  Pre-Procedure History & Physical: HPI:  Theresa Martin is a 67 y.o. female is here for an endoscopy and colonoscopy.   Past Medical History:  Diagnosis Date   Allergy    Anxiety    Asthma    Depression    Hyperlipidemia    Hypertension    Migraines    Osteopenia after menopause    Persistent headaches    Pneumonia 2020   Postherpetic neuralgia    Renal disorder    Shingles outbreak 02/2014   Skin cancer    moles on right groin and right buttock.    Past Surgical History:  Procedure Laterality Date   CARDIOVASCULAR STRESS TEST  02/2009   treadmill stress test: Low risk   CESAREAN SECTION  1987, 1989   COLONOSCOPY     2014/2015 [name], normal   COLONOSCOPY WITH PROPOFOL N/A 10/20/2021   Procedure: COLONOSCOPY WITH PROPOFOL;  Surgeon: Wyline Mood, MD;  Location: [facility] ENDOSCOPY;  Service: Gastroenterology;  Laterality: N/A;   DIAGNOSTIC LAPAROSCOPY     EXPLORATORY LAPAROTOMY     laproscopy for infertility   EYE SURGERY Bilateral    laser-correct opening b/tn cornea and iris   MOLE REMOVAL  2017   x 2 moles    Prior to Admission medications   Medication Sig Start Date End Date Taking? Authorizing Provider  albuterol (VENTOLIN HFA) 108 (90 Base) MCG/ACT inhaler TAKE 2 PUFFS BY MOUTH EVERY 6 HOURS AS NEEDED FOR WHEEZE OR SHORTNESS OF BREATH 07/29/21  Yes Sheliah Hatch, MD  atorvastatin (LIPITOR) 20 MG tablet TAKE 1 TABLET BY MOUTH EVERY DAY 03/12/22  Yes Sheliah Hatch, MD  budesonide-formoterol [facility]) 160-4.5 MCG/ACT inhaler Inhale 2 puffs into the lungs 2 (two) times daily. in the morning and at bedtime. 12/18/20  Yes Parrett, Tammy S, NP  Cholecalciferol (VITAMIN D3 PO) Take 5,000 Units by mouth.   Yes [provider]  clonazePAM (KLONOPIN) 0.5 MG tablet Take 0.5 mg by mouth daily as needed. 09/06/19  Yes [provider]  diclofenac Sodium (VOLTAREN) 1 % GEL Apply topically 4 (four) times daily.   Yes [provider]  ELDERBERRY PO Take by mouth.   Yes [provider]  eletriptan (RELPAX) 40 MG tablet Take 1 tablet (40 mg total) by mouth as needed for migraine or headache. May repeat in 2 hours if headache persists or recurs. 08/13/22  Yes Penumalli, Glenford Bayley, MD  escitalopram (LEXAPRO) 10 MG tablet Take 10 mg by mouth daily. 07/03/19  Yes [provider]  Fremanezumab-vfrm (AJOVY) 225 MG/1.5ML SOAJ INJECT 225 MG INTO THE SKIN EVERY 30 (THIRTY) DAYS. 08/12/22  Yes Penumalli, Glenford Bayley, MD  Fremanezumab-vfrm (AJOVY) 225 MG/1.5ML SOAJ Inject 225 mg into the skin every 30 (thirty) days. 08/17/22  Yes Penumalli, Vikram R, MD  gabapentin (NEURONTIN) 400 MG capsule TAKE 1 CAPSULE BY MOUTH 2 TIMES DAILY. 09/08/21  Yes Penumalli, Glenford Bayley, MD  Glucosamine-Chondroitin (MOVE FREE PO) Take by mouth daily.   Yes [provider]  latanoprost (XALATAN) 0.005 % ophthalmic solution Place 1 drop into both eyes at bedtime. 09/05/19  Yes Sheliah Hatch, MD  magnesium (MAGTAB) 84 MG ( ) TBCR SR tablet Take 84 mg by mouth.   Yes [provider]  Misc  Natural Products (JOINT HEALTH PO) Take by mouth.   Yes [provider]  montelukast (SINGULAIR) 10 MG tablet TAKE 1 TABLET BY MOUTH EVERYDAY AT BEDTIME 05/13/22  Yes Sheliah Hatch, MD  Multiple Vitamins-Minerals (WOMENS MULTIVITAMIN PO) Take by mouth.   Yes [provider]  polyethylene glycol-electrolytes (NULYTELY) 420 g solution Take by mouth. 08/16/22  Yes [provider]  Probiotic Product (PROBIOTIC PO) Take by mouth.   Yes [provider]  QUEtiapine (SEROQUEL XR) 400 MG 24 hr tablet Take 1 tablet (400 mg total) by mouth at bedtime. Patient taking differently: Take 800 mg  by mouth at bedtime. 02/15/19  Yes Money, Gerlene Burdock, FNP  rizatriptan (MAXALT-MLT) 10 MG disintegrating tablet Take 1 tablet (10 mg total) by mouth as needed for migraine (May repeat in 2 hours if needed). 05/26/22  Yes Penumalli, Glenford Bayley, MD  Spacer/Aero-Holding Chambers (AEROCHAMBER PLUS) inhaler Use as instructed 01/13/21  Yes Kasa, Wallis Bamberg, MD  temazepam (RESTORIL) 15 MG capsule Take 1 capsule (15 mg total) by mouth at bedtime. 02/15/19  Yes Money, Gerlene Burdock, FNP  triamterene-hydrochlorothiazide (MAXZIDE-25) 37.5-25 MG tablet TAKE 1 TABLET BY MOUTH EVERY DAY 03/18/22  Yes Sheliah Hatch, MD  Turmeric (QC TUMERIC COMPLEX) 500 MG CAPS Take by mouth.   Yes [provider]  UNABLE TO FIND Med Name: magnesium L theronate  650 mg at HS   Yes [provider]  zinc gluconate 50 MG tablet Take 50 mg by mouth daily.   Yes [provider]  Rimegepant Sulfate (NURTEC) 75 MG TBDP Take 1 tablet (75 mg total) by mouth as needed (for breakthrough migraine). 05/26/22   Penumalli, Glenford Bayley, MD    Allergies as of 08/16/2022   (No Known Allergies)    Family History  Problem Relation Age of Onset   Hypertension Mother    Coronary artery disease Mother    Heart failure Mother    Transient ischemic attack Mother    Hypertension Father    Liver disease Sister        liver failure   Migraines Sister    Arrhythmia Brother    Hypertension Brother    Cancer Neg Hx        colon    Breast cancer Neg Hx     Social History   Socioeconomic History   Marital status: Legally Separated    Spouse name: Not on file   Number of children: 2   Years of education: Not on file   Highest education level: 12th grade  Occupational History   Occupation: Geologist, engineering   Occupation: retired  Tobacco Use   Smoking status: Never   Smokeless tobacco: Never  Building services engineer Use: Never used  Substance and Sexual Activity   Alcohol use: No    Alcohol/week: 0.0 standard drinks of  alcohol   Drug use: No   Sexual activity: Not Currently  Other Topics Concern   Not on file  Social History Narrative   Regular exercise- yes, spins daily   Diet: fruits and veggies, water    No caffeine   Lives alone    Social Determinants of Health   Financial Resource Strain: Low Risk  (07/31/2022)   Overall Financial Resource Strain (CARDIA)    Difficulty of Paying Living Expenses: Not hard at all  Food Insecurity: No Food Insecurity (07/31/2022)   Hunger Vital Sign    Worried About Running Out of Food in the Last Year: Never true  Ran Out of Food in the Last Year: Never true  Transportation Needs: No Transportation Needs (07/31/2022)   PRAPARE - Administrator, Civil Service (Medical): No    Lack of Transportation (Non-Medical): No  Physical Activity: Insufficiently Active (07/31/2022)   Exercise Vital Sign    Days of Exercise per Week: 2 days    Minutes of Exercise per Session: 20 min  Stress: Stress Concern Present (07/31/2022)   [employer] of Occupational Health - Occupational Stress Questionnaire    Feeling of Stress : Rather much  Social Connections: Moderately Integrated (07/31/2022)   Social Connection and Isolation Panel [NHANES]    Frequency of Communication with Friends and Family: More than three times a week    Frequency of Social Gatherings with Friends and Family: Once a week    Attends Religious Services: More than 4 times per year    Active Member of [employer] or Organizations: Yes    Attends Banker Meetings: More than 4 times per year    Marital Status: Separated  Intimate Partner Violence: Not At Risk (04/29/2022)   Humiliation, Afraid, Rape, and Kick questionnaire    Fear of Current or Ex-Partner: No    Emotionally Abused: No    Physically Abused: No    Sexually Abused: No    Review of Systems: See HPI, otherwise negative ROS  Physical Exam: BP (!) 154/82   Pulse 75   Temp (!) 97.2 F (36.2 C) (Temporal)   Resp 18   Ht 5'  1" (1.549 m)   Wt 54.9 kg   SpO2 98%   BMI 22.86 kg/m  General:   Alert,  pleasant and cooperative in NAD Head:  Normocephalic and atraumatic. Neck:  Supple; no masses or thyromegaly. Lungs:  Clear throughout to auscultation.    Heart:  Regular rate and rhythm. Abdomen:  Soft, nontender and nondistended. Normal bowel sounds, without guarding, and without rebound.   Neurologic:  Alert and  oriented x4;  grossly normal neurologically.  Impression/Plan: Orpah Cobb Barbone is here for an endoscopy and colonoscopy to be performed for colon cancer screening, GERD, dysphagia  Risks, benefits, limitations, and alternatives regarding  endoscopy and colonoscopy have been reviewed with the patient.  Questions have been answered.  All parties agreeable.   Lannette Donath, MD  09/02/2022, 12:32 PM

## 2022-09-02 NOTE — Transfer of Care (Signed)
Immediate Anesthesia Transfer of Care Note  Patient: Theresa Martin  Procedure(s) Performed: COLONOSCOPY WITH PROPOFOL ESOPHAGOGASTRODUODENOSCOPY (EGD) WITH PROPOFOL  Patient Location: PACU  Anesthesia Type:MAC  Level of Consciousness: drowsy  Airway & Oxygen Therapy: Patient Spontanous Breathing  Post-op Assessment: Report given to RN and Post -op Vital signs reviewed and stable  Post vital signs: Reviewed  Last Vitals:  Vitals Value Taken Time  BP 111/62 09/02/22 1502  Temp 64F   Pulse 68 09/02/22 1504  Resp 12 09/02/22 1504  SpO2 100 % 09/02/22 1504  Vitals shown include unvalidated device data.  Last Pain:  Vitals:   09/02/22 1227  TempSrc: Temporal  PainSc: 0-No pain         Complications: No notable events documented.

## 2022-09-03 ENCOUNTER — Telehealth: Payer: Self-pay | Admitting: Family Medicine

## 2022-09-03 ENCOUNTER — Encounter: Payer: Self-pay | Admitting: Gastroenterology

## 2022-09-03 NOTE — Telephone Encounter (Signed)
Encourage patient to contact the pharmacy for refills or they can request refills through [facility]   WHAT PHARMACY WOULD THEY LIKE THIS SENT TO:  [id number] - [place], [place] - [address] [[id number]]   MEDICATION NAME & DOSE: Probiotic Product (PROBIOTIC PO)  NOTES/COMMENTS FROM PATIENT: Pt asking since she takes probiotic (Pantoprazole) should be on stool softener  Post colonoscopy and endoscopy- advise     Front office please notify patient: It takes 48-72 hours to process rx refill requests Ask patient to call pharmacy to ensure rx is ready before heading there.

## 2022-09-03 NOTE — Anesthesia Postprocedure Evaluation (Signed)
Anesthesia Post Note  Patient: Nazia Rhines Depree  Procedure(s) Performed: COLONOSCOPY WITH PROPOFOL ESOPHAGOGASTRODUODENOSCOPY (EGD) WITH PROPOFOL  Patient location during evaluation: Endoscopy Anesthesia Type: General Level of consciousness: awake and alert Pain management: pain level controlled Vital Signs Assessment: post-procedure vital signs reviewed and stable Respiratory status: spontaneous breathing, nonlabored ventilation, respiratory function stable and patient connected to nasal cannula oxygen Cardiovascular status: blood pressure returned to baseline and stable Postop Assessment: no apparent nausea or vomiting Anesthetic complications: no   No notable events documented.   Last Vitals:  Vitals:   09/02/22 1512 09/02/22 1522  BP: (!) 155/81 (!) 146/95  Pulse:    Resp:    Temp:    SpO2:      Last Pain:  Vitals:   09/02/22 1522  TempSrc:   PainSc: 0-No pain                 Stephanie Coup

## 2022-09-03 NOTE — Telephone Encounter (Signed)
I called pt and she states she had a colonoscopy and EGD yesterday . States they took Biopsies of what they believe are ulcers . She states her GI Dr Catalina Pizza her to go get Prilosec and take one tablet BID . She was asking if that was ok . I advised pt if GI instructed her to take the mediation then it was ok .

## 2022-09-06 ENCOUNTER — Telehealth: Payer: Self-pay | Admitting: Gastroenterology

## 2022-09-06 MED ORDER — PANTOPRAZOLE SODIUM 40 MG PO TBEC: 40.0000 mg | DELAYED_RELEASE_TABLET | Freq: Every day | ORAL | 5 refills | Status: DC

## 2022-09-06 MED ORDER — LINACLOTIDE 290 MCG PO CAPS: 290.0000 ug | ORAL_CAPSULE | Freq: Every day | ORAL | 5 refills | Status: DC

## 2022-09-06 NOTE — Telephone Encounter (Signed)
Informed patient of this and sent medication to the pharmacy

## 2022-09-06 NOTE — Telephone Encounter (Signed)
Patient states that she was given samples of Linzess 72, 140 and 290 she states the 290 samples are the one that help her constipation. She states she had good bowel movements with this strengthen. Patient is also wondering about the omeprazole and Pantoprazole. She states when she was discharged from the hospital she was give pantoprazole 40mg  once a day and it was written on her discharge notes from her procedure to start taking over the counter omeprazole 20mg . She wants to know which one she needs to do and she wants either one a prescription

## 2022-09-06 NOTE — Addendum Note (Signed)
Addended by: Radene Knee L on: 09/06/2022 04:27 PM   Modules accepted: Orders

## 2022-09-06 NOTE — Telephone Encounter (Signed)
Pt left vmm unsure if she should continue taking  linzess and also question about protonix please return call

## 2022-09-07 DIAGNOSIS — M9906 Segmental and somatic dysfunction of lower extremity: Secondary | ICD-10-CM | POA: Diagnosis not present

## 2022-09-07 DIAGNOSIS — M546 Pain in thoracic spine: Secondary | ICD-10-CM | POA: Diagnosis not present

## 2022-09-07 DIAGNOSIS — M25511 Pain in right shoulder: Secondary | ICD-10-CM | POA: Diagnosis not present

## 2022-09-08 ENCOUNTER — Other Ambulatory Visit: Payer: Self-pay | Admitting: Family Medicine

## 2022-09-08 DIAGNOSIS — E785 Hyperlipidemia, unspecified: Secondary | ICD-10-CM

## 2022-09-09 ENCOUNTER — Encounter: Payer: Self-pay | Admitting: Internal Medicine

## 2022-09-09 ENCOUNTER — Observation Stay: Payer: Medicare Other

## 2022-09-09 ENCOUNTER — Ambulatory Visit (INDEPENDENT_AMBULATORY_CARE_PROVIDER_SITE_OTHER): Payer: Medicare Other | Admitting: Internal Medicine

## 2022-09-09 ENCOUNTER — Emergency Department: Payer: Medicare Other

## 2022-09-09 ENCOUNTER — Telehealth: Payer: Self-pay | Admitting: Physician Assistant

## 2022-09-09 ENCOUNTER — Observation Stay
Admission: EM | Admit: 2022-09-09 | Discharge: 2022-09-10 | Disposition: A | Discharge status: Home / Self Care | Payer: Medicare Other | Attending: Internal Medicine | Admitting: Internal Medicine

## 2022-09-09 ENCOUNTER — Other Ambulatory Visit: Payer: Self-pay

## 2022-09-09 VITALS — BP 120/70 | HR 96 | Temp 97.6°F | Ht 61.0 in | Wt 121.8 lb

## 2022-09-09 DIAGNOSIS — R4182 Altered mental status, unspecified: Secondary | ICD-10-CM | POA: Diagnosis not present

## 2022-09-09 DIAGNOSIS — E785 Hyperlipidemia, unspecified: Secondary | ICD-10-CM | POA: Diagnosis not present

## 2022-09-09 DIAGNOSIS — J45909 Unspecified asthma, uncomplicated: Secondary | ICD-10-CM | POA: Diagnosis not present

## 2022-09-09 DIAGNOSIS — Z85828 Personal history of other malignant neoplasm of skin: Secondary | ICD-10-CM | POA: Diagnosis not present

## 2022-09-09 DIAGNOSIS — Q670 Congenital facial asymmetry: Secondary | ICD-10-CM | POA: Insufficient documentation

## 2022-09-09 DIAGNOSIS — R000 Tachycardia, unspecified: Secondary | ICD-10-CM | POA: Diagnosis not present

## 2022-09-09 DIAGNOSIS — Z79899 Other long term (current) drug therapy: Secondary | ICD-10-CM | POA: Diagnosis not present

## 2022-09-09 DIAGNOSIS — G43809 Other migraine, not intractable, without status migrainosus: Secondary | ICD-10-CM

## 2022-09-09 DIAGNOSIS — F418 Other specified anxiety disorders: Secondary | ICD-10-CM | POA: Diagnosis present

## 2022-09-09 DIAGNOSIS — J4520 Mild intermittent asthma, uncomplicated: Secondary | ICD-10-CM

## 2022-09-09 DIAGNOSIS — R471 Dysarthria and anarthria: Secondary | ICD-10-CM | POA: Diagnosis not present

## 2022-09-09 DIAGNOSIS — R4781 Slurred speech: Principal | ICD-10-CM | POA: Diagnosis present

## 2022-09-09 DIAGNOSIS — I10 Essential (primary) hypertension: Secondary | ICD-10-CM | POA: Diagnosis not present

## 2022-09-09 DIAGNOSIS — R200 Anesthesia of skin: Secondary | ICD-10-CM | POA: Insufficient documentation

## 2022-09-09 DIAGNOSIS — R918 Other nonspecific abnormal finding of lung field: Secondary | ICD-10-CM | POA: Diagnosis not present

## 2022-09-09 DIAGNOSIS — I6522 Occlusion and stenosis of left carotid artery: Secondary | ICD-10-CM | POA: Diagnosis not present

## 2022-09-09 DIAGNOSIS — G43909 Migraine, unspecified, not intractable, without status migrainosus: Secondary | ICD-10-CM | POA: Diagnosis present

## 2022-09-09 DIAGNOSIS — R531 Weakness: Secondary | ICD-10-CM | POA: Diagnosis not present

## 2022-09-09 HISTORY — DX: Slurred speech: R47.81

## 2022-09-09 LAB — COMPREHENSIVE METABOLIC PANEL
ALT: 37 U/L (ref 0–44)
AST: 39 U/L (ref 15–41)
Albumin: 4.4 g/dL (ref 3.5–5.0)
Alkaline Phosphatase: 74 U/L (ref 38–126)
Anion gap: 8 (ref 5–15)
BUN: 14 mg/dL (ref 8–23)
CO2: 28 mmol/L (ref 22–32)
Calcium: 8.7 mg/dL — ABNORMAL LOW (ref 8.9–10.3)
Chloride: 98 mmol/L (ref 98–111)
Creatinine, Ser: 0.6 mg/dL (ref 0.44–1.00)
GFR, Estimated: >60 mL/min (ref >60)
Glucose, Bld: 114 mg/dL — ABNORMAL HIGH (ref 70–99)
Potassium: 3.6 mmol/L (ref 3.5–5.1)
Sodium: 134 mmol/L — ABNORMAL LOW (ref 135–145)
Total Bilirubin: 0.5 mg/dL (ref 0.3–1.2)
Total Protein: 7.4 g/dL (ref 6.5–8.1)

## 2022-09-09 LAB — DIFFERENTIAL
Abs Immature Granulocytes: 0.01 10*3/uL (ref 0.00–0.07)
Basophils Absolute: 0 10*3/uL (ref 0.0–0.1)
Basophils Relative: 1 %
Eosinophils Absolute: 0 10*3/uL (ref 0.0–0.5)
Eosinophils Relative: 1 %
Immature Granulocytes: 0 %
Lymphocytes Relative: 21 %
Lymphs Abs: 1.4 10*3/uL (ref 0.7–4.0)
Monocytes Absolute: 0.5 10*3/uL (ref 0.1–1.0)
Monocytes Relative: 7 %
Neutro Abs: 4.7 10*3/uL (ref 1.7–7.7)
Neutrophils Relative %: 70 %

## 2022-09-09 LAB — PROTIME-INR
INR: 0.9 (ref 0.8–1.2)
Prothrombin Time: 12.6 seconds (ref 11.4–15.2)

## 2022-09-09 LAB — HEMOGLOBIN A1C
Hgb A1c MFr Bld: 5.2 % (ref 4.8–5.6)
Mean Plasma Glucose: 102.54 mg/dL

## 2022-09-09 LAB — CBC
HCT: 37.1 % (ref 36.0–46.0)
Hemoglobin: 12.4 g/dL (ref 12.0–15.0)
MCH: 30.8 pg (ref 26.0–34.0)
MCHC: 33.4 g/dL (ref 30.0–36.0)
MCV: 92.1 fL (ref 80.0–100.0)
Platelets: 240 10*3/uL (ref 150–400)
RBC: 4.03 MIL/uL (ref 3.87–5.11)
RDW: 11.8 % (ref 11.5–15.5)
WBC: 6.7 10*3/uL (ref 4.0–10.5)
nRBC: 0 % (ref 0.0–0.2)

## 2022-09-09 LAB — ETHANOL: Alcohol, Ethyl (B): <10 mg/dL (ref <10)

## 2022-09-09 LAB — CBG MONITORING, ED: Glucose-Capillary: 116 mg/dL — ABNORMAL HIGH (ref 70–99)

## 2022-09-09 LAB — APTT: aPTT: 27 seconds (ref 24–36)

## 2022-09-09 LAB — GLUCOSE, POCT (MANUAL RESULT ENTRY): POC Glucose: 126 mg/dl — AB (ref 70–99)

## 2022-09-09 MED ORDER — MONTELUKAST SODIUM 10 MG PO TABS
10.0000 mg | ORAL_TABLET | Freq: Every day | ORAL | Status: DC
  Administered 2022-09-09: 10 mg via ORAL
  Filled 2022-09-09: qty 1

## 2022-09-09 MED ORDER — ONDANSETRON HCL 4 MG/2ML IJ SOLN: 4.0000 mg | Freq: Three times a day (TID) | INTRAMUSCULAR | Status: DC | PRN

## 2022-09-09 MED ORDER — ACETAMINOPHEN 160 MG/5ML PO SOLN: 650.0000 mg | ORAL | Status: DC | PRN

## 2022-09-09 MED ORDER — ACETAMINOPHEN 650 MG RE SUPP: 650.0000 mg | RECTAL | Status: DC | PRN

## 2022-09-09 MED ORDER — HYDRALAZINE HCL 20 MG/ML IJ SOLN: 5.0000 mg | INTRAMUSCULAR | Status: DC | PRN

## 2022-09-09 MED ORDER — ELDERBERRY 500 MG PO CAPS: 1.0000 | ORAL_CAPSULE | Freq: Every day | ORAL | Status: DC

## 2022-09-09 MED ORDER — VITAMIN D 25 MCG (1000 UNIT) PO TABS
5000.0000 [IU] | ORAL_TABLET | Freq: Every day | ORAL | Status: DC
  Administered 2022-09-10: 5000 [IU] via ORAL
  Filled 2022-09-09: qty 5

## 2022-09-09 MED ORDER — TEMAZEPAM 15 MG PO CAPS
15.0000 mg | ORAL_CAPSULE | Freq: Every day | ORAL | Status: DC
  Administered 2022-09-09: 15 mg via ORAL
  Filled 2022-09-09: qty 2

## 2022-09-09 MED ORDER — RIMEGEPANT SULFATE 75 MG PO TBDP
75.0000 mg | ORAL_TABLET | ORAL | Status: DC | PRN
  Filled 2022-09-09: qty 1

## 2022-09-09 MED ORDER — ATORVASTATIN CALCIUM 20 MG PO TABS
20.0000 mg | ORAL_TABLET | Freq: Every day | ORAL | Status: DC
  Administered 2022-09-10: 20 mg via ORAL
  Filled 2022-09-09: qty 1

## 2022-09-09 MED ORDER — CLONAZEPAM 0.5 MG PO TABS: 0.5000 mg | ORAL_TABLET | Freq: Every day | ORAL | Status: DC | PRN

## 2022-09-09 MED ORDER — ACETAMINOPHEN 325 MG PO TABS: 650.0000 mg | ORAL_TABLET | ORAL | Status: DC | PRN

## 2022-09-09 MED ORDER — IOHEXOL 350 MG/ML SOLN
75.0000 mL | Freq: Once | INTRAVENOUS | Status: AC | PRN
  Administered 2022-09-09: 75 mL via INTRAVENOUS

## 2022-09-09 MED ORDER — ALBUTEROL SULFATE (2.5 MG/3ML) 0.083% IN NEBU: 3.0000 mL | INHALATION_SOLUTION | RESPIRATORY_TRACT | Status: DC | PRN

## 2022-09-09 MED ORDER — ASPIRIN 81 MG PO TBEC
81.0000 mg | DELAYED_RELEASE_TABLET | Freq: Every day | ORAL | Status: DC
  Administered 2022-09-09 – 2022-09-10 (×2): 81 mg via ORAL
  Filled 2022-09-09 (×2): qty 1

## 2022-09-09 MED ORDER — DM-GUAIFENESIN ER 30-600 MG PO TB12: 1.0000 | ORAL_TABLET | Freq: Two times a day (BID) | ORAL | Status: DC | PRN

## 2022-09-09 MED ORDER — QUETIAPINE FUMARATE ER 300 MG PO TB24
800.0000 mg | ORAL_TABLET | Freq: Every day | ORAL | Status: DC
  Administered 2022-09-09: 800 mg via ORAL
  Filled 2022-09-09: qty 1

## 2022-09-09 MED ORDER — SUMATRIPTAN SUCCINATE 50 MG PO TABS
100.0000 mg | ORAL_TABLET | ORAL | Status: DC | PRN
  Filled 2022-09-09: qty 2

## 2022-09-09 MED ORDER — SENNOSIDES-DOCUSATE SODIUM 8.6-50 MG PO TABS: 1.0000 | ORAL_TABLET | Freq: Every evening | ORAL | Status: DC | PRN

## 2022-09-09 MED ORDER — GABAPENTIN 300 MG PO CAPS
400.0000 mg | ORAL_CAPSULE | Freq: Two times a day (BID) | ORAL | Status: DC
  Administered 2022-09-09 – 2022-09-10 (×2): 400 mg via ORAL
  Filled 2022-09-09 (×2): qty 1

## 2022-09-09 MED ORDER — SODIUM CHLORIDE 0.9% FLUSH: 3.0000 mL | Freq: Once | INTRAVENOUS | Status: DC

## 2022-09-09 MED ORDER — RIZATRIPTAN BENZOATE 10 MG PO TBDP: 10.0000 mg | ORAL_TABLET | ORAL | Status: DC | PRN

## 2022-09-09 MED ORDER — ADULT MULTIVITAMIN W/MINERALS CH
1.0000 | ORAL_TABLET | Freq: Every day | ORAL | Status: DC
  Administered 2022-09-10: 1 via ORAL
  Filled 2022-09-09: qty 1

## 2022-09-09 MED ORDER — STROKE: EARLY STAGES OF RECOVERY BOOK: Freq: Once | Status: AC

## 2022-09-09 MED ORDER — ESCITALOPRAM OXALATE 10 MG PO TABS
10.0000 mg | ORAL_TABLET | Freq: Every day | ORAL | Status: DC
  Administered 2022-09-10: 10 mg via ORAL
  Filled 2022-09-09: qty 1

## 2022-09-09 MED ORDER — PANTOPRAZOLE SODIUM 40 MG PO TBEC
40.0000 mg | DELAYED_RELEASE_TABLET | Freq: Every day | ORAL | Status: DC
  Administered 2022-09-10: 40 mg via ORAL
  Filled 2022-09-09: qty 1

## 2022-09-09 MED ORDER — LATANOPROST 0.005 % OP SOLN
1.0000 [drp] | Freq: Every day | OPHTHALMIC | Status: DC
  Administered 2022-09-09: 1 [drp] via OPHTHALMIC
  Filled 2022-09-09: qty 2.5

## 2022-09-09 MED ORDER — JOINT HEALTH PO CAPS: 1.0000 | ORAL_CAPSULE | Freq: Every day | ORAL | Status: DC

## 2022-09-09 MED ORDER — MOMETASONE FURO-FORMOTEROL FUM 200-5 MCG/ACT IN AERO
2.0000 | INHALATION_SPRAY | Freq: Two times a day (BID) | RESPIRATORY_TRACT | Status: DC
  Administered 2022-09-10: 2 via RESPIRATORY_TRACT
  Filled 2022-09-09: qty 8.8

## 2022-09-09 MED ORDER — ENOXAPARIN SODIUM 40 MG/0.4ML IJ SOSY
40.0000 mg | PREFILLED_SYRINGE | INTRAMUSCULAR | Status: DC
  Administered 2022-09-09: 40 mg via SUBCUTANEOUS
  Filled 2022-09-09: qty 0.4

## 2022-09-09 NOTE — ED Notes (Signed)
Pt to ct 2

## 2022-09-09 NOTE — Consult Note (Addendum)
NEURO HOSPITALIST CONSULT NOTE   Requestig physician: Dr. Modesto Charon  Reason for Consult: Acute onset of slurred speech  History obtained from:  Patient, RN and Chart     HPI:                                                                                                                                          Theresa Martin is a 67 y.o. female with a PMHx of anxiety, asthma, depression, HLD, HTN, migraines, osteopenia and postherpetic neuralgia who presents to the ED from her Pulmonologist's office after she had onset of sensation of diffuse weakness, new focal LUE weakness, shaking, tongue feeling numb and with slurred speech. No focal deficits were detectable on exam at the clinic. LKN 1030. In Triage the patient stated that the tip of her tongue had been feeling numb for the past week and was unchanged, but that the other symptoms described above were new. Triage RN detected some facial asymmetry. The patient was also complaining of a left sided headache. Code Stroke was called.   Past Medical History:  Diagnosis Date   Allergy    Anxiety    Asthma    Depression    Hyperlipidemia    Hypertension    Migraines    Osteopenia after menopause    Persistent headaches    Pneumonia 2020   Postherpetic neuralgia    Renal disorder    Shingles outbreak 02/2014   Skin cancer    moles on right groin and right buttock.    Past Surgical History:  Procedure Laterality Date   CARDIOVASCULAR STRESS TEST  02/2009   treadmill stress test: Low risk   CESAREAN SECTION  1987, 1989   COLONOSCOPY     2014/2015 [facility], normal   COLONOSCOPY WITH PROPOFOL N/A 10/20/2021   Procedure: COLONOSCOPY WITH PROPOFOL;  Surgeon: Wyline Mood, MD;  Location: [facility] ENDOSCOPY;  Service: Gastroenterology;  Laterality: N/A;   COLONOSCOPY WITH PROPOFOL N/A 09/02/2022   Procedure: COLONOSCOPY WITH PROPOFOL;  Surgeon: Toney Reil, MD;  Location: [facility] ENDOSCOPY;  Service: Gastroenterology;   Laterality: N/A;  Requests about 11:15 arrival   DIAGNOSTIC LAPAROSCOPY     ESOPHAGOGASTRODUODENOSCOPY (EGD) WITH PROPOFOL N/A 09/02/2022   Procedure: ESOPHAGOGASTRODUODENOSCOPY (EGD) WITH PROPOFOL;  Surgeon: Toney Reil, MD;  Location: [facility] ENDOSCOPY;  Service: Gastroenterology;  Laterality: N/A;   EXPLORATORY LAPAROTOMY     laproscopy for infertility   EYE SURGERY Bilateral    laser-correct opening b/tn cornea and iris   MOLE REMOVAL  2017   x 2 moles    Family History  Problem Relation Age of Onset   Hypertension Mother    Coronary artery disease Mother    Heart failure Mother    Transient ischemic attack Mother  Hypertension Father    Liver disease Sister        liver failure   Migraines Sister    Arrhythmia Brother    Hypertension Brother    Cancer Neg Hx        colon    Breast cancer Neg Hx              Social History:  reports that she has never smoked. She has never used smokeless tobacco. She reports that she does not drink alcohol and does not use drugs.  No Known Allergies  HOME MEDICATIONS:                                                                                                                      No current facility-administered medications on file prior to encounter.   Current Outpatient Medications on File Prior to Encounter  Medication Sig Dispense Refill   albuterol (VENTOLIN HFA) 108 (90 Base) MCG/ACT inhaler TAKE 2 PUFFS BY MOUTH EVERY 6 HOURS AS NEEDED FOR WHEEZE OR SHORTNESS OF BREATH 18 g 2   atorvastatin (LIPITOR) 20 MG tablet TAKE 1 TABLET BY MOUTH EVERY DAY 90 tablet 1   budesonide-formoterol (SYMBICORT) 160-4.5 MCG/ACT inhaler Inhale 2 puffs into the lungs 2 (two) times daily. in the morning and at bedtime. (Patient taking differently: Inhale 1 puff into the lungs daily. in the morning and at bedtime.) 10.2 each 5   Cholecalciferol (VITAMIN D3 PO) Take 5,000 Units by mouth.     clonazePAM (KLONOPIN) 0.5 MG tablet Take 0.5 mg by  mouth daily as needed.     diclofenac Sodium (VOLTAREN) 1 % GEL Apply topically 4 (four) times daily.     ELDERBERRY PO Take by mouth.     eletriptan (RELPAX) 40 MG tablet Take 1 tablet (40 mg total) by mouth as needed for migraine or headache. May repeat in 2 hours if headache persists or recurs. (Patient not taking: Reported on 09/09/2022) 10 tablet 0   escitalopram (LEXAPRO) 10 MG tablet Take 10 mg by mouth daily.     Fremanezumab-vfrm (AJOVY) 225 MG/1.5ML SOAJ INJECT 225 MG INTO THE SKIN EVERY 30 (THIRTY) DAYS. 225 mL 0   Fremanezumab-vfrm (AJOVY) 225 MG/1.5ML SOAJ Inject 225 mg into the skin every 30 (thirty) days. (Patient not taking: Reported on 09/09/2022) 4.5 mL 1   gabapentin (NEURONTIN) 400 MG capsule TAKE 1 CAPSULE BY MOUTH 2 TIMES DAILY. 60 capsule 12   Glucosamine-Chondroitin (MOVE FREE PO) Take by mouth daily.     latanoprost (XALATAN) 0.005 % ophthalmic solution Place 1 drop into both eyes at bedtime. 2.5 mL 12   linaclotide (LINZESS) 290 MCG CAPS capsule Take 1 capsule (290 mcg total) by mouth daily before breakfast. (Patient not taking: Reported on 09/09/2022) 30 capsule 5   Misc Natural Products (JOINT HEALTH PO) Take by mouth.     montelukast (SINGULAIR) 10 MG tablet TAKE 1 TABLET BY MOUTH EVERYDAY AT BEDTIME 90 tablet 1   Multiple Vitamins-Minerals (WOMENS MULTIVITAMIN  PO) Take by mouth.     pantoprazole (PROTONIX) 40 MG tablet Take 1 tablet (40 mg total) by mouth daily. (Patient not taking: Reported on 09/09/2022) 30 tablet 5   Probiotic Product (PROBIOTIC PO) Take by mouth.     QUEtiapine (SEROQUEL XR) 400 MG 24 hr tablet Take 1 tablet (400 mg total) by mouth at bedtime. (Patient taking differently: Take 800 mg by mouth at bedtime.) 30 tablet 1   Rimegepant Sulfate (NURTEC) 75 MG TBDP Take 1 tablet (75 mg total) by mouth as needed (for breakthrough migraine). 8 tablet 12   rizatriptan (MAXALT-MLT) 10 MG disintegrating tablet Take 1 tablet (10 mg total) by mouth as needed for  migraine (May repeat in 2 hours if needed). 10 tablet 12   Spacer/Aero-Holding Chambers (AEROCHAMBER PLUS) inhaler Use as instructed 1 each 2   temazepam (RESTORIL) 15 MG capsule Take 1 capsule (15 mg total) by mouth at bedtime. 30 capsule 0   triamterene-hydrochlorothiazide (MAXZIDE-25) 37.5-25 MG tablet TAKE 1 TABLET BY MOUTH EVERY DAY 90 tablet 1   Turmeric (QC TUMERIC COMPLEX) 500 MG CAPS Take by mouth.     UNABLE TO FIND Take 2 tablets by mouth at bedtime. Med Name: magnesium L theronate  650 mg at HS     zinc gluconate 50 MG tablet Take 50 mg by mouth daily.       ROS:                                                                                                                                       As per HPI. Detailed ROS deferred in the context of acuity of presentation.    Blood pressure 123/76, pulse (!) 103, resp. rate 18, SpO2 95 %.   General Examination:                                                                                                       Physical Exam HEENT- [place]/AT   Lungs- Respirations unlabored Extremities- Warm and well-perfused  Neurological Examination Mental Status: Awake and alert. Subtle drowsy/sedated affect. Fully oriented. Thought content appropriate.  Speech is subtly dysarthric but fluent, with intact naming and comprehension.  Able to follow all commands without difficulty. No hemineglect. Cranial Nerves: II: Temporal visual fields intact with no extinction to DSS. PERRL. III,IV, VI: No ptosis. EOMI. No nystagmus. V: Temp sensation equal bilaterally VII: Smile and grimace are with symmetric movement but there is mildly decreased prominence of the NL fold  on the right VIII: Hearing intact to voice IX,X: No hypophonia or hoarseness XI: Symmetric XII: Midline tongue extension Motor: RUE: 5/5 LUE: 5/5 RLE: 5/5 LLE: 5/5 Sensory: Temp and FT intact x 4. No extinction to DSS. Deep Tendon Reflexes: 1+ and symmetric bilateral biceps,  brachioradialis and patellae Plantars: Right: downgoingLeft: downgoing Cerebellar: No ataxia with FNF or H-S bilaterally Gait: Able to stand with own power and independently transfer from CT table to wheelchair  NIHSS: 1 (dysarthria). Minimal facial asymmetry is most consistent with incidental variation   Lab Results: Basic Metabolic Panel: No results for input(s): "NA", "K", "CL", "CO2", "GLUCOSE", "BUN", "CREATININE", "CALCIUM", "MG", "PHOS" in the last 168 hours.  CBC: No results for input(s): "WBC", "NEUTROABS", "HGB", "HCT", "MCV", "PLT" in the last 168 hours.  Cardiac Enzymes: No results for input(s): "CKTOTAL", "CKMB", "CKMBINDEX", "TROPONINI" in the last 168 hours.  Lipid Panel: No results for input(s): "CHOL", "TRIG", "HDL", "CHOLHDL", "VLDL", "LDLCALC" in the last 168 hours.  Imaging: No results found.   Assessment: 67 year old female with a PMHx of anxiety, asthma, depression, HLD, HTN, migraines, osteopenia and postherpetic neuralgia who presents to the ED from her Pulmonologist's office after she had onset of sensation of diffuse weakness, new focal LUE weakness, shaking, tongue feeling numb and with slurred speech. No focal deficits were detectable on exam at the clinic. LKN 1030. In Triage the patient stated that the tip of her tongue had been feeling numb for the past week and was unchanged, but that the other symptoms described above were new. Triage RN detected some facial asymmetry. The patient was also complaining of a left sided headache. Code Stroke was called.  - Exam reveals minimal dysarthria, mildly sedated/drowsy affect and incidental minimal facial asymmetry. NIHSS 1.  - CT head: No acute intracranial abnormality or significant interval change. Stable subcortical white matter hypointensities. This likely reflects the sequela of chronic microvascular ischemia. Aspects is 10/10. - Glucose was 116 on arrival to the ED.  - DDx includes complicated migraine,  stress-related symptoms, possible hypoglycemic event with rapid recovery prior to arrival to the ED, conversion disorder and lacunar infarction.   Recommendations: - MRI brain - Continue her statin - Start ASA 81 mg po qd - Permissive HTN x 24 hours - CTA of head and neck - TTE - Cardiac telemetry - HgbA1c, fasting lipid panel - PT consult, OT consult, Speech consult - Risk factor modification - Telemetry monitoring - Frequent neuro checks - NPO until passes stroke swallow screen - Management of headache pain. Compazine 10 mg IV x 1 may be of benefit.   Addendum: CTA of head and neck: Beaded appearance of the cervical internal carotid arteries bilaterally consistent with fibromuscular dysplasia. No significant associated stenosis. Atherosclerotic changes at the left carotid bifurcation without significant stenosis. Normal variant CTA Circle of Willis without significant proximal stenosis, aneurysm, or branch vessel occlusion.   Electronically signed: Dr. Caryl Pina 09/09/2022, 12:29 PM

## 2022-09-09 NOTE — Patient Instructions (Signed)
Patient with acute shaking spells post exercise with numbness of the tongue some slurred speech Recommend ER visit as soon as possible  At this time no respiratory issues at this time no wheezing no cough no signs of infection Recommend assessing for cardiac issues

## 2022-09-09 NOTE — Progress Notes (Signed)
   09/09/22 1602  Spiritual Encounters  Type of Visit Follow up  Referral source Patient request  Reason for visit Advance directives  OnCall Visit Yes  Advance Directives (For Healthcare)  Does Patient Have a Medical Advance Directive? No  Would patient like information on creating a medical advance directive? No - Patient declined  Mental Health Advance Directives  Does Patient Have a Mental Health Advance Directive? No  Would patient like information on creating a mental health advance directive? No - Patient declined   Chaplain went to meet with patient to fill out advance directive. Patient was gone to receive a  CT scan. Informed nurses to page chaplain to help patient with ADR.

## 2022-09-09 NOTE — Progress Notes (Signed)
PHARMACIST - PHYSICIAN ORDER COMMUNICATION  CONCERNING: P&T Medication Policy on Herbal Medications  DESCRIPTION:  This patient's order(s) for: [facility] Caps has been noted.  This product(s) is classified as an "herbal" or natural product. Due to a lack of definitive safety studies or FDA approval, nonstandard manufacturing practices, plus the potential risk of unknown drug-drug interactions while on inpatient medications, the Pharmacy and Therapeutics Committee does not permit the use of "herbal" or natural products of this type within [facility].   ACTION TAKEN: The pharmacy department is unable to verify this order at this time.  Please reevaluate patient's clinical condition at discharge and address if the herbal or natural product(s) should be resumed at that time.  Otelia Sergeant, PharmD, [facility] 09/09/2022 9:38 PM

## 2022-09-09 NOTE — ED Notes (Signed)
1229 Cart activated. Pt in CT at time of activation 1229 Page to [facility] neuro 1234 Dr Georg Ruddle in CT to assess pt.

## 2022-09-09 NOTE — ED Notes (Signed)
Called [facility] activated Code Stroke w/Lauren

## 2022-09-09 NOTE — ED Notes (Addendum)
Triage questions were not completed by triage nurse because triage nurse did not have time. Triage nurse brought her to CT, stayed with pt in CT and back to the room.

## 2022-09-09 NOTE — H&P (Addendum)
History and Physical    Theresa Martin:[id number] DOB: 1956/01/19 DOA: 09/09/2022  Referring MD/NP/PA:   PCP: Sheliah Hatch, MD   Patient coming from:  The patient is coming from home.     Chief Complaint: slurred speech, tongue numbness  HPI: Theresa Martin is a 67 y.o. female with medical history significant of HTN, HLD, asthma, depression with anxiety, migraine headache, who presents with slurred speech, tongue numbness.  Patient states that she has tongue tip numbness for about 1 week. At about 10:30 AM today, she developed slurred speech.  She has whole body weakness, denies unilateral weakness, numbness or tinglings in extremities to me.  She has facial asymmetry, but patient states that it is normal to her.  Patient does not have chest pain, cough, shortness of breath.  No nausea, vomiting, diarrhea or abdominal pain.  No symptoms of UTI.  Patient states that she had an episode of heart racing earlier, which has resolved currently.  She reports left-sided headache which she attributes to migraine.  Data reviewed independently and ED Course: pt was found to have WBC 6.7, GFR> 60, temperature normal, blood pressure 123/76, heart rate 103, RR 18, oxygen saturation 95% on room air.  CT of head is negative for acute intracranial issues.  Patient is placed on telemetry for observation.  Dr. Otelia Limes of neurology is consulted.   EKG: I have personally reviewed.  Sinus rhythm, QTc 451, LAE, early R wave progression   Review of Systems:   General: no fevers, chills, no body weight gain. Has weakness and headache. HEENT: no blurry vision, hearing changes or sore throat Respiratory: no dyspnea, coughing, wheezing CV: no chest pain, had heart racing GI: no nausea, vomiting, abdominal pain, diarrhea, constipation GU: no dysuria, burning on urination, increased urinary frequency, hematuria  Ext: no leg edema Neuro: no unilateral weakness, numbness, or tingling in  extremities.  Has slurred speech, no vision change or hearing loss Skin: no rash, no skin tear. MSK: No muscle spasm, no deformity, no limitation of range of movement in spin Heme: No easy bruising.  Travel history: No recent long distant travel.   Allergy: No Known Allergies  Past Medical History:  Diagnosis Date   Allergy    Anxiety    Asthma    Depression    Hyperlipidemia    Hypertension    Migraines    Osteopenia after menopause    Persistent headaches    Pneumonia 2020   Postherpetic neuralgia    Renal disorder    Shingles outbreak 02/2014   Skin cancer    moles on right groin and right buttock.   Slurred speech 09/09/2022    Past Surgical History:  Procedure Laterality Date   CARDIOVASCULAR STRESS TEST  02/2009   treadmill stress test: Low risk   CESAREAN SECTION  1987, 1989   COLONOSCOPY     2014/2015 [facility], normal   COLONOSCOPY WITH PROPOFOL N/A 10/20/2021   Procedure: COLONOSCOPY WITH PROPOFOL;  Surgeon: Wyline Mood, MD;  Location: [facility] ENDOSCOPY;  Service: Gastroenterology;  Laterality: N/A;   COLONOSCOPY WITH PROPOFOL N/A 09/02/2022   Procedure: COLONOSCOPY WITH PROPOFOL;  Surgeon: Toney Reil, MD;  Location: [facility] ENDOSCOPY;  Service: Gastroenterology;  Laterality: N/A;  Requests about 11:15 arrival   DIAGNOSTIC LAPAROSCOPY     ESOPHAGOGASTRODUODENOSCOPY (EGD) WITH PROPOFOL N/A 09/02/2022   Procedure: ESOPHAGOGASTRODUODENOSCOPY (EGD) WITH PROPOFOL;  Surgeon: Toney Reil, MD;  Location: [facility] ENDOSCOPY;  Service: Gastroenterology;  Laterality: N/A;   EXPLORATORY  LAPAROTOMY     laproscopy for infertility   EYE SURGERY Bilateral    laser-correct opening b/tn cornea and iris   MOLE REMOVAL  2017   x 2 moles    Social History:  reports that she has never smoked. She has never used smokeless tobacco. She reports that she does not drink alcohol and does not use drugs.  Family History:  Family History  Problem Relation Age of Onset    Hypertension Mother    Coronary artery disease Mother    Heart failure Mother    Transient ischemic attack Mother    Hypertension Father    Liver disease Sister        liver failure   Migraines Sister    Arrhythmia Brother    Hypertension Brother    Cancer Neg Hx        colon    Breast cancer Neg Hx      Prior to Admission medications   Medication Sig Start Date End Date Taking? Authorizing Provider  albuterol (VENTOLIN HFA) 108 (90 Base) MCG/ACT inhaler TAKE 2 PUFFS BY MOUTH EVERY 6 HOURS AS NEEDED FOR WHEEZE OR SHORTNESS OF BREATH 07/29/21   Sheliah Hatch, MD  atorvastatin (LIPITOR) 20 MG tablet TAKE 1 TABLET BY MOUTH EVERY DAY 09/08/22   Sheliah Hatch, MD  budesonide-formoterol [facility]) 160-4.5 MCG/ACT inhaler Inhale 2 puffs into the lungs 2 (two) times daily. in the morning and at bedtime. Patient taking differently: Inhale 1 puff into the lungs daily. in the morning and at bedtime. 12/18/20   Parrett, Virgel Bouquet, NP  Cholecalciferol (VITAMIN D3 PO) Take 5,000 Units by mouth.    [provider]  clonazePAM (KLONOPIN) 0.5 MG tablet Take 0.5 mg by mouth daily as needed. 09/06/19   [provider]  diclofenac Sodium (VOLTAREN) 1 % GEL Apply topically 4 (four) times daily.    [provider]  ELDERBERRY PO Take by mouth.    [provider]  eletriptan (RELPAX) 40 MG tablet Take 1 tablet (40 mg total) by mouth as needed for migraine or headache. May repeat in 2 hours if headache persists or recurs. Patient not taking: Reported on 09/09/2022 08/13/22   Penumalli, Glenford Bayley, MD  escitalopram (LEXAPRO) 10 MG tablet Take 10 mg by mouth daily. 07/03/19   [provider]  Fremanezumab-vfrm (AJOVY) 225 MG/1.5ML SOAJ INJECT 225 MG INTO THE SKIN EVERY 30 (THIRTY) DAYS. 08/12/22   Penumalli, Glenford Bayley, MD  Fremanezumab-vfrm (AJOVY) 225 MG/1.5ML SOAJ Inject 225 mg into the skin every 30 (thirty) days. Patient not taking: Reported on 09/09/2022 08/17/22    Penumalli, Glenford Bayley, MD  gabapentin (NEURONTIN) 400 MG capsule TAKE 1 CAPSULE BY MOUTH 2 TIMES DAILY. 09/08/21   Penumalli, Glenford Bayley, MD  Glucosamine-Chondroitin (MOVE FREE PO) Take by mouth daily.    [provider]  latanoprost (XALATAN) 0.005 % ophthalmic solution Place 1 drop into both eyes at bedtime. 09/05/19   Sheliah Hatch, MD  linaclotide Karlene Einstein) 290 MCG CAPS capsule Take 1 capsule (290 mcg total) by mouth daily before breakfast. Patient not taking: Reported on 09/09/2022 09/06/22   Celso Amy, PA-C  Misc Natural Products (JOINT HEALTH PO) Take by mouth.    [provider]  montelukast (SINGULAIR) 10 MG tablet TAKE 1 TABLET BY MOUTH EVERYDAY AT BEDTIME 05/13/22   Sheliah Hatch, MD  Multiple Vitamins-Minerals (WOMENS MULTIVITAMIN PO) Take by mouth.    [provider]  pantoprazole (PROTONIX) 40 MG tablet  Take 1 tablet (40 mg total) by mouth daily. Patient not taking: Reported on 09/09/2022 09/06/22   Celso Amy, PA-C  Probiotic Product (PROBIOTIC PO) Take by mouth.    [provider]  QUEtiapine (SEROQUEL XR) 400 MG 24 hr tablet Take 1 tablet (400 mg total) by mouth at bedtime. Patient taking differently: Take 800 mg by mouth at bedtime. 02/15/19   Money, Gerlene Burdock, FNP  Rimegepant Sulfate (NURTEC) 75 MG TBDP Take 1 tablet (75 mg total) by mouth as needed (for breakthrough migraine). 05/26/22   Penumalli, Glenford Bayley, MD  rizatriptan (MAXALT-MLT) 10 MG disintegrating tablet Take 1 tablet (10 mg total) by mouth as needed for migraine (May repeat in 2 hours if needed). 05/26/22   Penumalli, Glenford Bayley, MD  Spacer/Aero-Holding Chambers (AEROCHAMBER PLUS) inhaler Use as instructed 01/13/21   Erin Fulling, MD  temazepam (RESTORIL) 15 MG capsule Take 1 capsule (15 mg total) by mouth at bedtime. 02/15/19   Money, Gerlene Burdock, FNP  triamterene-hydrochlorothiazide (MAXZIDE-25) 37.5-25 MG tablet TAKE 1 TABLET BY MOUTH EVERY DAY 09/08/22   Sheliah Hatch, MD   Turmeric (QC TUMERIC COMPLEX) 500 MG CAPS Take by mouth.    [provider]  UNABLE TO FIND Take 2 tablets by mouth at bedtime. Med Name: magnesium L theronate  650 mg at [facility]    [provider]  zinc gluconate 50 MG tablet Take 50 mg by mouth daily.    [provider]    Physical Exam: Vitals:   09/09/22 1500 09/09/22 1515 09/09/22 1530 09/09/22 1613  BP:      Pulse: 81 67 75   Resp: 17 (!) 21 (!) 23   Temp:      TempSrc:      SpO2:      Weight:    54.4 kg  Height:    5\' 1"  (1.549 m)   General: Not in acute distress HEENT:       Eyes: PERRL, EOMI, no jaundice       ENT: No discharge from the ears and nose, no pharynx injection, no tonsillar enlargement.        Neck: No JVD, no bruit, no mass felt. Heme: No neck lymph node enlargement. Cardiac: S1/S2, RRR, No gallops or rubs. Respiratory: No rales, wheezing, rhonchi or rubs. GI: Soft, nondistended, nontender, no rebound pain, no organomegaly, BS present. GU: No hematuria Ext: No pitting leg edema bilaterally. 1+DP/PT pulse bilaterally. Musculoskeletal: No joint deformities, No joint redness or warmth, no limitation of ROM in spin. Skin: No rashes.  Neuro: Alert, oriented X3, cranial nerves II-XII grossly intact, moves all extremities normally. Muscle strength 5/5 in all extremities, sensation to light touch intact. Psych: Patient is not psychotic, no suicidal or hemocidal ideation.  Labs on Admission: I have personally reviewed following labs and imaging studies  CBC: Recent Labs  Lab 09/09/22 1300  WBC 6.7  NEUTROABS 4.7  HGB 12.4  HCT 37.1  MCV 92.1  PLT 240   Basic Metabolic Panel: Recent Labs  Lab 09/09/22 1300  NA 134*  K 3.6  CL 98  CO2 28  GLUCOSE 114*  BUN 14  CREATININE 0.60  CALCIUM 8.7*   GFR: Estimated Creatinine Clearance: 51.5 mL/min (by C-G formula based on SCr of 0.6 mg/dL). Liver Function Tests: Recent Labs  Lab 09/09/22 1300  AST 39  ALT 37  ALKPHOS 74   BILITOT 0.5  PROT 7.4  ALBUMIN 4.4   No results for input(s): "LIPASE", "AMYLASE" in  the last 168 hours. No results for input(s): "AMMONIA" in the last 168 hours. Coagulation Profile: Recent Labs  Lab 09/09/22 1300  INR 0.9   Cardiac Enzymes: No results for input(s): "CKTOTAL", "CKMB", "CKMBINDEX", "TROPONINI" in the last 168 hours. BNP (last 3 results) No results for input(s): "PROBNP" in the last 8760 hours. HbA1C: No results for input(s): "HGBA1C" in the last 72 hours. CBG: Recent Labs  Lab 09/09/22 1223  GLUCAP 116*   Lipid Profile: No results for input(s): "CHOL", "HDL", "LDLCALC", "TRIG", "CHOLHDL", "LDLDIRECT" in the last 72 hours. Thyroid Function Tests: No results for input(s): "TSH", "T4TOTAL", "FREET4", "T3FREE", "THYROIDAB" in the last 72 hours. Anemia Panel: No results for input(s): "VITAMINB12", "FOLATE", "FERRITIN", "TIBC", "IRON", "RETICCTPCT" in the last 72 hours. Urine analysis:    Component Value Date/Time   COLORURINE STRAW (A) 01/31/2019 1835   APPEARANCEUR CLEAR 01/31/2019 1835   LABSPEC 1.004 (L) 01/31/2019 1835   PHURINE 7.0 01/31/2019 1835   GLUCOSEU NEGATIVE 01/31/2019 1835   HGBUR SMALL (A) 01/31/2019 1835   BILIRUBINUR NEGATIVE 01/31/2019 1835   KETONESUR 20 (A) 01/31/2019 1835   PROTEINUR NEGATIVE 01/31/2019 1835   UROBILINOGEN 0.2 05/07/2013 1540   NITRITE NEGATIVE 01/31/2019 1835   LEUKOCYTESUR NEGATIVE 01/31/2019 1835   Sepsis Labs: @LABRCNTIP (procalcitonin:4,lacticidven:4) )No results found for this or any previous visit (from the past 240 hour(s)).   Radiological Exams on Admission: CT ANGIO HEAD NECK W WO CM  Result Date: 09/09/2022 CLINICAL DATA:  Neuro deficit, acute, stroke suspected. Slurred speech beginning at 10:30 a.m. today. Numbness in the tip of tongue for 1 week. Weakness in the left upper extremity. EXAM: CT ANGIOGRAPHY HEAD AND NECK WITH AND WITHOUT CONTRAST TECHNIQUE: Multidetector CT imaging of the head and neck  was performed using the standard protocol during bolus administration of intravenous contrast. Multiplanar CT image reconstructions and MIPs were obtained to evaluate the vascular anatomy. Carotid stenosis measurements (when applicable) are obtained utilizing NASCET criteria, using the distal internal carotid diameter as the denominator. RADIATION DOSE REDUCTION: This exam was performed according to the departmental dose-optimization program which includes automated exposure control, adjustment of the mA and/or kV according to patient size and/or use of iterative reconstruction technique. CONTRAST:  75mL OMNIPAQUE IOHEXOL 350 MG/ML SOLN COMPARISON:  CT head without contrast 09/09/22. MR head without and with contrast 12/14/2020 FINDINGS: CTA NECK FINDINGS Aortic arch: Common origin of the left common carotid artery and innominate artery noted. Minimal calcification is present at the distal aortic arch. No significant calcification or stenosis is present at the great vessel origins. Right carotid system: The right common carotid artery is within normal limits. The bifurcation is unremarkable. The cervical right ICA is mildly tortuous. Two separate segments of beading along the wall of the artery are noted. No focal stenosis is associated. Left carotid system: The left common carotid artery is within normal limits. Atherosclerotic calcifications are present at the distal common artery and bifurcation without significant stenosis. Mild tortuosity is present in the cervical left ICA. Beaded appearance of the artery is most evident just below the skull base. Vertebral arteries: The left vertebral artery is slightly dominant to the right. Both vertebral arteries originate from the subclavian arteries without significant stenosis. No significant stenosis is present in either vertebral artery in the neck. Skeleton: Slight anterolisthesis is present at C4-5. Alignment is otherwise anatomic. Straightening of the normal  cervical lordosis is present. Other neck: Soft tissues the neck are otherwise unremarkable. Salivary glands are within normal limits. Thyroid is  normal. No significant adenopathy is present. No focal mucosal or submucosal lesions are present. Upper chest: The lung apices are clear. The thoracic inlet is within normal limits. Review of the MIP images confirms the above findings CTA HEAD FINDINGS Anterior circulation: The internal carotid arteries are within normal limits from the high cervical segments through the ICA termini bilaterally. The A1 and M1 segments are normal. The anterior communicating artery is patent. The MCA bifurcations are normal bilaterally. The ACA and MCA branch vessels are normal bilaterally. Posterior circulation: The PICA origins are visualized and normal. The vertebral arteries and vertebrobasilar junction are normal. The basilar artery is within normal limits. Superior cerebellar arteries are patent. Both posterior cerebral arteries originate from basilar tip. The PCA branch vessels are normal bilaterally. Venous sinuses: The dural sinuses are patent. The straight sinus and deep cerebral veins are intact. Cortical veins are within normal limits. No significant vascular malformation is evident. Anatomic variants: None Review of the MIP images confirms the above findings IMPRESSION: 1. Beaded appearance of the cervical internal carotid arteries bilaterally consistent with fibromuscular dysplasia. 2. No significant associated stenosis. 3. Atherosclerotic changes at the left carotid bifurcation without significant stenosis. 4. Normal variant CTA Circle of Willis without significant proximal stenosis, aneurysm, or branch vessel occlusion. Electronically Signed   By: Marin Roberts M.D.   On: 09/09/2022 17:13   CT HEAD CODE STROKE WO CONTRAST  Result Date: 09/09/2022 CLINICAL DATA:  Code stroke. Slurred speech beginning at 10:30 AMS. Numbness in the tip of tongue for 1 week. Weakness in  the left upper extremity. EXAM: CT HEAD WITHOUT CONTRAST TECHNIQUE: Contiguous axial images were obtained from the base of the skull through the vertex without intravenous contrast. RADIATION DOSE REDUCTION: This exam was performed according to the departmental dose-optimization program which includes automated exposure control, adjustment of the mA and/or kV according to patient size and/or use of iterative reconstruction technique. COMPARISON:  MR head without and with contrast 12/14/2020 FINDINGS: Brain: No acute infarct, hemorrhage, or mass lesion is present. Stable subcortical white matter hypointensities noted. Deep brain nuclei are within normal limits. The ventricles are of normal size. No significant extraaxial fluid collection is present. The brainstem and cerebellum are within normal limits. Midline structures are within normal limits. Vascular: No hyperdense vessel or unexpected calcification. Skull: Calvarium is intact. No focal lytic or blastic lesions are present. No significant extracranial soft tissue lesion is present. Sinuses/Orbits: The paranasal sinuses and mastoid air cells are clear. The globes and orbits are within normal limits. ASPECTS [facility] Stroke Program Early CT Score) - Ganglionic level infarction (caudate, lentiform nuclei, internal capsule, insula, M1-M3 cortex): 7/7 - Supraganglionic infarction (M4-M6 cortex): 3/3 Total score (0-10 with 10 being normal): 10/10 IMPRESSION: 1. No acute intracranial abnormality or significant interval change. 2. Stable subcortical white matter hypointensities. This likely reflects the sequela of chronic microvascular ischemia. 3. Aspects is 10/10. The above was relayed via text pager to Dr. Pilar Jarvis on 09/09/2022 at 12:35 . Electronically Signed   By: Marin Roberts M.D.   On: 09/09/2022 12:37      Assessment/Plan Principal Problem:   Slurred speech Active Problems:   Migraine   Essential hypertension   Asthma   Hyperlipidemia    Depression with anxiety   Assessment and Plan:  Slurred speech: Patient has slurred speech, tongue numbness, concerning for possible stroke vs. TIA. CT-head negative. Consulted Dr. Otelia Limes of neurology, who recommended full stroke workup.  -Placed on tele med bed for observation -  Obtain MRI-brain  - CTA of  head and neck - will hold oral Bp meds to allow permissive HTN in the setting of acute stroke  - ASA and lipitor - fasting lipid panel and HbA1c  - 2D transthoracic echocardiography  - swallowing screen. If fails, will get SLP - PT/OT consult  Migraine: pt is taking Maxalt at home, which is not good choice in the setting of possible stroke. -will let pt try prn Nurtec  Essential hypertension: - prn IV hydralazine for SBP>220 or dBP>110 -hold home Bp meds: Maxzide  Asthma: stable -Bronchodilators  Hyperlipidemia -Lipitor  Depression with anxiety -Continue home medications      DVT ppx: SQ Lovenox  Code Status: Full code     Family Communication:    Yes, patient's brother  at bed side.  Disposition Plan:  Anticipate discharge back to previous environment  Consults called:  Dr. Otelia Limes of neurology  Admission status and Level of care: Telemetry Medical:    for obs   Dispo: The patient is from: Home              Anticipated d/c is to: Home              Anticipated d/c date is: 1 day              Patient currently is not medically stable to d/c.    Severity of Illness:  The appropriate patient status for this patient is OBSERVATION. Observation status is judged to be reasonable and necessary in order to provide the required intensity of service to ensure the patient's safety. The patient's presenting symptoms, physical exam findings, and initial radiographic and laboratory data in the context of their medical condition is felt to place them at decreased risk for further clinical deterioration. Furthermore, it is anticipated that the patient will be medically  stable for discharge from the hospital within 2 midnights of admission.        Date of Service 09/09/2022    Lorretta Harp [facility]   If 7PM-7AM, please contact night-coverage [email or web address] 09/09/2022, 6:16 PM

## 2022-09-09 NOTE — ED Provider Notes (Signed)
[facility] Provider Note    Event Date/Time   First MD Initiated Contact with Patient 09/09/22 1225     (approximate)   History   Code Stroke   HPI  Theresa Martin is a 67 y.o. female   Past medical history of hypertension, hyperlipidemia, migraine headaches, anxiety and depression who presents to the emergency department as a stroke code.  Last known well at approximately 10:30 AM.  The symptoms were preceded by 1 week of abdominal redness.  Then around 10:30 AM she developed slurred speech and facial asymmetry.  He denies any illnesses, trauma, or any other acute medical complaints.   Physical Exam   Triage Vital Signs: ED Triage Vitals [09/09/22 1223]  Enc Vitals Group     BP 123/76     Pulse Rate (!) 103     Resp 18     Temp      Temp src      SpO2 95 %     Weight      Height      Head Circumference      Peak Flow      Pain Score      Pain Loc      Pain Edu?      Excl. in GC?     Most recent vital signs: Vitals:   09/09/22 1223 09/09/22 1326  BP: 123/76   Pulse: (!) 103   Resp: 18   Temp:  98 F (36.7 C)  SpO2: 95%     General: Awake, no distress.  CV:  Good peripheral perfusion.  Resp:  Normal effort.  Abd:  No distention.  Other:  Dysarthria, facial asymmetry, but motor or sensory intact.  Clear lungs soft nontender abdomen normal hemodynamics aside from some mild tachycardia   ED Results / Procedures / Treatments   Labs (all labs ordered are listed, but only abnormal results are displayed) Labs Reviewed  COMPREHENSIVE METABOLIC PANEL - Abnormal; Notable for the following components:      Result Value   Sodium 134 (*)    Glucose, Bld 114 (*)    Calcium 8.7 (*)    All other components within normal limits  CBG MONITORING, ED - Abnormal; Notable for the following components:   Glucose-Capillary 116 (*)    All other components within normal limits  PROTIME-INR  APTT  CBC  DIFFERENTIAL  ETHANOL     I  ordered and reviewed the above labs they are notable for glucose wnl  EKG  ED ECG REPORT I, Pilar Jarvis, the attending physician, personally viewed and interpreted this ECG.   Date: 09/09/2022  EKG Time: 1259  Rate: 82  Rhythm: nsr  Axis: nl  Intervals:none  ST&T Change: no stemi    RADIOLOGY I independently reviewed and interpreted CT of the head see no obvious bleeding or midline shift   PROCEDURES:  Critical Care performed: Yes, see critical care procedure note(s)  .Critical Care  Performed by: Pilar Jarvis, MD Authorized by: Pilar Jarvis, MD   Critical care provider statement:    Critical care time (minutes):  30   Critical care was time spent personally by me on the following activities:  Development of treatment plan with patient or surrogate, discussions with consultants, evaluation of patient's response to treatment, examination of patient, ordering and review of laboratory studies, ordering and review of radiographic studies, ordering and performing treatments and interventions, pulse oximetry, re-evaluation of patient's condition and review of old charts  MEDICATIONS ORDERED IN ED: Medications  sodium chloride flush (NS) 0.9 % injection 3 mL (3 mLs Intravenous Not Given 09/09/22 1314)     IMPRESSION / MDM / ASSESSMENT AND PLAN / ED COURSE  I reviewed the triage vital signs and the nursing notes.                                Patient's presentation is most consistent with acute presentation with potential threat to life or bodily function.  Differential diagnosis includes, but is not limited to, CVA, electrolyte disturbance, dysrhythmia, neuropathy   The patient is on the cardiac monitor to evaluate for evidence of arrhythmia and/or significant heart rate changes.  MDM: Patient with acute onset neurologic deficit preceded by 1 week of tongue numbness.  No other acute medical complaints, neurology at bedside to evaluate, CT head negative symptoms mild so  no  thrombolytic per neuro. neurologic symptoms doubt other medical causes, given no other acute medical complaints in this well-appearing patient, dispo per neurology recommendations         FINAL CLINICAL IMPRESSION(S) / ED DIAGNOSES   Final diagnoses:  Slurred speech  Facial asymmetry     Rx / DC Orders   ED Discharge Orders     None        Note:  This document was prepared using Dragon voice recognition software and may include unintentional dictation errors.    Pilar Jarvis, MD 09/09/22 1426

## 2022-09-09 NOTE — Telephone Encounter (Signed)
Called patient and patient states she has a 35 dollar copay and tried to see if she qualify for patient assistants and states she did not. Informed patient that 49 dollar is a good copay what I have heard from other patient. She then asked if she needed to take the Prilosec with the Linzess. Informed her these were two different medications and they were used for different things. Informed her Linzess was used for constipation and Prilosec for GERD. Informed her that she is on Pantoprazole not Prilosec. She states she wants her to take Pantoprazole informed her yes. She states she had a good bowel movement this morning and how long does she need to stay on the higher does . Theresa Martin wanted her to make a 4 week follow up appointment after her procedures so made her a follow up appointment so she can discuss this with tina informed her to stay on the medication till the appointment

## 2022-09-09 NOTE — ED Triage Notes (Signed)
Pt to ED for slurred speech started at 1030. States tip of tongue has been numb for one week. States feels weaker on left arm in last hour. Equal grip and strength. Face asymmetriclal C/o left sided headache

## 2022-09-09 NOTE — Progress Notes (Signed)
   09/09/22 1313  Spiritual Encounters  Type of Visit Initial  Conversation partners present during encounter Nurse  Referral source Nurse (RN/NT/LPN)  Reason for visit Code  OnCall Visit Yes  Advance Directives (For Healthcare)  Does Patient Have a Medical Advance Directive? No  Would patient like information on creating a medical advance directive? No - Patient declined   Chaplain received code stroke page. Chaplain answered page and the patient was in CT. Chaplain informed nurses to page her when/if the patients family arrives. Will continue to follow up.

## 2022-09-09 NOTE — ED Notes (Signed)
Cbg 116 °

## 2022-09-09 NOTE — Telephone Encounter (Signed)
Pt left message inquiring about discount card for linzess and if its okay to take prilosec please return call

## 2022-09-09 NOTE — Code Documentation (Addendum)
Stroke Response Nurse Documentation Code Documentation  Theresa Martin is a 67 y.o. female arriving to [facility] via [employer] [facility] on 09/09/2022 with past medical hx of HLD, HTN, migraines, postherpetic neuralgia, renal disorder,  . On No antithrombotic. Code stroke was activated by ED.   Patient from doctors appointment where she was LKW at 1030 and now complaining of facial droop, left side headache, dizziness. Patient reports this morning had an episode of generalized weakness and "shakiness" this morning. Patient went to a doctors appointment where she had acute onset dizziness, headache, facial droop. Per triage RN, staff bringing pt over to ED reported left sided weakness, facial droop. Patient reports having similar episodes in the past.      Stroke team at the bedside on patient arrival. Patient to CT with team. NIHSS 2, see documentation for details and code stroke times. Patient with right facial droop and dysarthria  on exam.  The following imaging was completed:  CT Head. Patient is not a candidate for IV Thrombolytic due to too mild to treat, per MD. Patient is not a candidate for IR due to exam not consistent with LVO, per MD.   Care Plan: q30 minute NIHSS and vital signs until patient is outside of window at 1500. Swallow screen per order.   Bedside handoff with ED RN Tammy.    Wille Glaser  Stroke Response RN

## 2022-09-09 NOTE — Progress Notes (Signed)
Name: DHANVI BOESEN MRN: [id number] DOB: May 02, 1955       TEST/EVENTS :  CT chest 10/2018 showed chronic changes in RUL felt from previous PNA  And multiple 3 mm nodules.   CT chest November 2021 negative for PE, stable chronic scarring in the lung apices, chronic airspace opacity in the right upper lobe, opacity in the left lower lobe, stable to millimeter lung nodules, no adenopathy, small hiatal hernia  CBC - Eosinophils 11/2020 -ab eos (100)   Triggers include smoke perfumes colognes  CT of the chest reviewed 2021 RML opacity, small lung nodules Images reviewed with patient today  Will need to consider repeat CT chest for follow up assessment of nodules and opacity Patient with previous history of right lower lobe pneumonia and history of lung nodules subcentimeter CT scan reviewed over the last 10 years Will need to repeat CT chest and compared to 2021   CHIEF COMPLAINT Follow up ASTHMA Follow up abnormal CT chest   HISTORY OF PRESENT ILLNESS: At this time patient is very weak Shaking Her tongue feels numb She has slurred speech o no focal deficits on neurological examination Patient does not have any signs of respiratory distress Vital signs reviewed Patient will need to go to the ER for further assessment     PAST MEDICAL HISTORY :   has a past medical history of Allergy, Anxiety, Asthma, Depression, Hyperlipidemia, Hypertension, Migraines, Osteopenia after menopause, Persistent headaches, Pneumonia (2020), Postherpetic neuralgia, Renal disorder, Shingles outbreak (02/2014), and Skin cancer.  has a past surgical history that includes Cesarean section (1987, 1989); Exploratory laparotomy; Cardiovascular stress test (02/2009); Mole removal (2017); Eye surgery (Bilateral); Colonoscopy; Diagnostic laparoscopy; Colonoscopy with propofol (N/A, 10/20/2021); Colonoscopy with propofol (N/A, 09/02/2022); and Esophagogastroduodenoscopy (egd) with propofol (N/A,  09/02/2022). Prior to Admission medications   Medication Sig Start Date End Date Taking? Authorizing Provider  albuterol (VENTOLIN HFA) 108 (90 Base) MCG/ACT inhaler Inhale 2 puffs into the lungs every 6 (six) hours as needed for wheezing or shortness of breath. 08/10/18   Sheliah Hatch, MD  atorvastatin (LIPITOR) 20 MG tablet TAKE 1 TABLET BY MOUTH EVERY DAY 04/10/18   Sheliah Hatch, MD  beclomethasone (QVAR) 80 MCG/ACT inhaler Inhale 1-2 puffs into the lungs 2 (two) times daily. 09/27/18   Waldon Merl, PA-C  Calcium-Vitamin D-Vitamin K (CALCIUM + D + K PO) Take 3 each by mouth daily. Calcium 500mg - Vitamin D 1000iu- K 40mg      [provider]  clonazePAM (KLONOPIN) 0.5 MG tablet Take 0.25 mg by mouth at bedtime as needed.  01/12/14   [provider]  ELDERBERRY PO Take by mouth.    [provider]  escitalopram (LEXAPRO) 10 MG tablet Take 10 mg by mouth daily.     [provider]  gabapentin (NEURONTIN) 100 MG capsule Take 1-3 capsules (100-300 mg total) by mouth 2 (two) times daily. Patient taking differently: Take 100-300 mg by mouth 2 (two) times daily. 100 in the morning and 200 at night. 01/30/18   Penumalli, Glenford Bayley, MD  Glucosamine-Chondroitin (MOVE FREE PO) Take 1 tablet by mouth daily.    [provider]  Ketoconazole-Hydrocortisone 2 & 1 % KIT Apply topically as needed. Cracks on mouth    [provider]  lamoTRIgine (LAMICTAL) 100 MG tablet TAKE 100mg  in the morning and 100 mg by mouth before bed.    [provider]  Melatonin 3 MG CAPS Take 1 capsule by mouth at bedtime.  [provider]  montelukast (SINGULAIR) 10 MG tablet Take 1 tablet (10 mg total) by mouth at bedtime. 09/27/18   Waldon Merl, PA-C  Naproxen Sodium (ALEVE) 220 MG CAPS As needed for headache 04/15/15   Penumalli, Glenford Bayley, MD  NEOMYCIN-POLYMYXIN-HYDROCORTISONE (CORTISPORIN) 1 % SOLN OTIC solution Apply 1-2 drops to toe bid after  soaking 04/03/18   Hyatt, Max T, DPM  traZODone (DESYREL) 50 MG tablet Take 50 mg by mouth at bedtime as needed for sleep.  05/29/15   [provider]  triamterene-hydrochlorothiazide (MAXZIDE-25) 37.5-25 MG tablet TAKE 1 TABLET BY MOUTH EVERY DAY 10/04/18   Sheliah Hatch, MD   No Known Allergies  FAMILY HISTORY:  family history includes Arrhythmia in her brother; Coronary artery disease in her mother; Heart failure in her mother; Hypertension in her brother, father, and mother; Liver disease in her sister; Migraines in her sister; Transient ischemic attack in her mother. SOCIAL HISTORY:  reports that she has never smoked. She has never used smokeless tobacco. She reports that she does not drink alcohol and does not use drugs.   BP 120/70 (BP Location: Left Arm, Patient Position: Sitting, Cuff Size: Normal)   Pulse 96   Temp 97.6 F (36.4 C) (Oral)   Ht 5\' 1"  (1.549 m)   Wt 121 lb 12.8 oz (55.2 kg)   SpO2 99%   BMI 23.01 kg/m     Review of Systems: Gen:  Denies  fever, sweats, chills weight loss  HEENT: Denies blurred vision, double vision, ear pain, eye pain, hearing loss, nose bleeds, sore throat Cardiac:  +dizziness Resp:   No cough, -sputum production, -shortness of breath,-wheezing, -hemoptysis,  Slurred speech, shaking, feeling weak No signs of infection Other:  All other systems negative   Physical Examination:   General Appearance: No distress  EYES PERRLA, EOM intact.   NECK Supple, No JVD Pulmonary: normal breath sounds, No wheezing.  CardiovascularNormal S1,S2.  No m/r/g.   Abdomen: Benign, Soft, non-tender. Neurology UE/LE 5/5 strength, no focal deficits Ext pulses intact, cap refill intact ALL OTHER ROS ARE NEGATIVE    IMAGING   CT chest 10/2018  No acute process, 3 mm nodules No further CT scans needed   CT  was Independently Reviewed By Me Today       ASSESSMENT AND PLAN SYNOPSIS 67 year old pleasant white female seen today for  follow-up assessment for asthma and abnormal CT chest with right middle lobe opacity and multiple subcentimeter lung nodules  At this time I am very concerned about cardiac issues along with possible TIA stroke Recommend ER assessment  No signs of asthma exacerbation at this time  Asthma mild intermittent well-controlled Continue Symbicort as prescribed Albuterol as needed No signs of exacerbation     MEDICATION ADJUSTMENTS/LABS AND TESTS ORDERED: Continue inhalers as prescribed ER assessment    CURRENT MEDICATIONS REVIEWED AT LENGTH WITH PATIENT TODAY    Total time spent 32 minutes  Teven Mittman Santiago Glad, M.D.  [facility]  Medical Director [facility] Medical Director [facility] Cardio-Pulmonary Department

## 2022-09-09 NOTE — Addendum Note (Signed)
Addended by: Delrae Rend on: 09/09/2022 12:27 PM   Modules accepted: Orders

## 2022-09-10 ENCOUNTER — Observation Stay (HOSPITAL_BASED_OUTPATIENT_CLINIC_OR_DEPARTMENT_OTHER)
Admit: 2022-09-10 | Discharge: 2022-09-10 | Disposition: A | Discharge status: Home / Self Care | Payer: Medicare Other | Attending: Internal Medicine | Admitting: Internal Medicine

## 2022-09-10 ENCOUNTER — Telehealth: Payer: Self-pay | Admitting: Gastroenterology

## 2022-09-10 DIAGNOSIS — I10 Essential (primary) hypertension: Secondary | ICD-10-CM

## 2022-09-10 DIAGNOSIS — R4781 Slurred speech: Secondary | ICD-10-CM | POA: Diagnosis not present

## 2022-09-10 LAB — ECHOCARDIOGRAM COMPLETE
AR max vel: 2.19 cm2
AV Area VTI: 2.09 cm2
AV Area mean vel: 1.92 cm2
AV Mean grad: 3.3 mmHg
AV Peak grad: 4.7 mmHg
Ao pk vel: 1.08 m/s
Area-P 1/2: 3.93 cm2
Height: 61 in
MV VTI: 1.83 cm2
S' Lateral: 2.7 cm
Weight: 1920 oz

## 2022-09-10 LAB — LIPID PANEL
Cholesterol: 153 mg/dL (ref 0–200)
HDL: 61 mg/dL (ref >40)
LDL Cholesterol: 79 mg/dL (ref 0–99)
Total CHOL/HDL Ratio: 2.5 RATIO
Triglycerides: 64 mg/dL (ref <150)
VLDL: 13 mg/dL (ref 0–40)

## 2022-09-10 LAB — HIV ANTIBODY (ROUTINE TESTING W REFLEX): HIV Screen 4th Generation wRfx: NONREACTIVE

## 2022-09-10 NOTE — Progress Notes (Signed)
AVS discussed and provided to patient. IV removed. Tele removed. No complaints at this time.

## 2022-09-10 NOTE — Telephone Encounter (Signed)
PT left vm message to get results on procedure from a week ago and to schedule an appointment for Dr. Allegra Lai for One month please return call

## 2022-09-10 NOTE — Progress Notes (Signed)
*  PRELIMINARY RESULTS* Echocardiogram 2D Echocardiogram has been performed.  Theresa Martin 09/10/2022, 8:02 AM

## 2022-09-10 NOTE — Telephone Encounter (Signed)
Pt has appointment scheduled with [name] on 10/12/2022

## 2022-09-10 NOTE — Progress Notes (Signed)
Brief note.  Full note to follow.  This is a pleasant 67 year old female who presented initially on 6/13 to her pulmonologist office.  They are in our office she was complaining of tongue numbness and slurred speech and was directed to the ED as a stroke alert.  Patient has a history of migraine headaches.  Workup for CVA has been overall unrevealing.  MRI negative.  CTA did reveal some beaded appearance of vasculature consistent with fibromuscular dysplasia.  Patient's symptoms have improved.  Discussed case with neurology.  Felt to be related to underlying complicated migraine.  Echocardiogram performed and read pending at time of this note.  Will await formal echocardiogram read.  If stable we will plan on discharge today.  Lolita Patella MD  No charge

## 2022-09-10 NOTE — Discharge Summary (Signed)
Physician Discharge Summary  Theresa Martin ZOX:[id number] DOB: 08-15-1955 DOA: 09/09/2022  PCP: Sheliah Hatch, MD  Admit date: 09/09/2022 Discharge date: 09/10/2022  Admitted From: Home Disposition:  Home  Recommendations for Outpatient Follow-up:  Follow up with PCP in 1-2 weeks Follow up with neurology  Home Health:No  Equipment/Devices:None   Discharge Condition:Stable  CODE STATUS:FULL  Diet recommendation: Reg  Brief/Interim Summary: 67 y.o. female with medical history significant of HTN, HLD, asthma, depression with anxiety, migraine headache, who presents with slurred speech, tongue numbness.   Patient states that she has tongue tip numbness for about 1 week. At about 10:30 AM today, she developed slurred speech.  She has whole body weakness, denies unilateral weakness, numbness or tinglings in extremities to me.  She has facial asymmetry, but patient states that it is normal to her.  Patient does not have chest pain, cough, shortness of breath.  No nausea, vomiting, diarrhea or abdominal pain.  No symptoms of UTI.  Patient states that she had an episode of heart racing earlier, which has resolved currently.  She reports left-sided headache which she attributes to migraine.  Patient's symptoms have improved. Discussed case with neurology. Felt to be related to underlying complicated migraine.  CVA workup negative.  Echocardiogram normal.  Patient has no evidence of beaded vasculature on CTA indicating underlying fibromuscular dysplasia.  Unclear significance.  Likely unrelated to current presentation.  Outpatient follow-up.  Patient stable for DC home at this time.  Follow-up outpatient PCP and headache specialist.    Discharge Diagnoses:  Principal Problem:   Slurred speech Active Problems:   Migraine   Essential hypertension   Asthma   Hyperlipidemia   Depression with anxiety  Slurred speech Tongue numbness Unclear etiology.  Felt to be related to  complicated migraine.  Slurred speech has resolved.  Tongue numbness persists.  CVA ruled out.  Discussed with neurology.  Stable for discharge.  No changes made at home medication regimen.  Echocardiogram normal.  Follow-up outpatient PCP and headache specialist in [place].    Discharge Instructions  Discharge Instructions     Diet - low sodium heart healthy   Complete by: As directed    Increase activity slowly   Complete by: As directed       Allergies as of 09/10/2022   No Known Allergies      Medication List     TAKE these medications    AeroChamber Plus inhaler Use as instructed   Ajovy 225 MG/1.5ML Soaj Generic drug: Fremanezumab-vfrm INJECT 225 MG INTO THE SKIN EVERY 30 (THIRTY) DAYS.   Ajovy 225 MG/1.5ML Soaj Generic drug: Fremanezumab-vfrm Inject 225 mg into the skin every 30 (thirty) days.   albuterol 108 (90 Base) MCG/ACT inhaler Commonly known as: VENTOLIN HFA TAKE 2 PUFFS BY MOUTH EVERY 6 HOURS AS NEEDED FOR WHEEZE OR SHORTNESS OF BREATH   atorvastatin 20 MG tablet Commonly known as: LIPITOR TAKE 1 TABLET BY MOUTH EVERY DAY   budesonide-formoterol 160-4.5 MCG/ACT inhaler Commonly known as: Symbicort Inhale 2 puffs into the lungs 2 (two) times daily. in the morning and at bedtime. What changed:  how much to take when to take this additional instructions   clonazePAM 0.5 MG tablet Commonly known as: KLONOPIN Take 0.5 mg by mouth daily as needed.   ELDERBERRY PO Take by mouth.   eletriptan 40 MG tablet Commonly known as: RELPAX Take 1 tablet (40 mg total) by mouth as needed for migraine or headache. May repeat in 2 hours  if headache persists or recurs.   escitalopram 10 MG tablet Commonly known as: LEXAPRO Take 10 mg by mouth daily.   gabapentin 400 MG capsule Commonly known as: NEURONTIN TAKE 1 CAPSULE BY MOUTH 2 TIMES DAILY.   JOINT HEALTH PO Take by mouth.   latanoprost 0.005 % ophthalmic solution Commonly known as:  Xalatan Place 1 drop into both eyes at bedtime.   linaclotide 290 MCG Caps capsule Commonly known as: Linzess Take 1 capsule (290 mcg total) by mouth daily before breakfast.   montelukast 10 MG tablet Commonly known as: SINGULAIR TAKE 1 TABLET BY MOUTH EVERYDAY AT BEDTIME   Nurtec 75 MG Tbdp Generic drug: Rimegepant Sulfate Take 1 tablet (75 mg total) by mouth as needed (for breakthrough migraine).   pantoprazole 40 MG tablet Commonly known as: PROTONIX Take 1 tablet (40 mg total) by mouth daily.   PROBIOTIC PO Take by mouth.   QC Tumeric Complex 500 MG Caps Generic drug: Turmeric Take by mouth.   QUEtiapine 400 MG 24 hr tablet Commonly known as: SEROQUEL XR Take 1 tablet (400 mg total) by mouth at bedtime. What changed: how much to take   rizatriptan 10 MG disintegrating tablet Commonly known as: MAXALT-MLT Take 1 tablet (10 mg total) by mouth as needed for migraine (May repeat in 2 hours if needed).   temazepam 15 MG capsule Commonly known as: RESTORIL Take 1 capsule (15 mg total) by mouth at bedtime.   triamterene-hydrochlorothiazide 37.5-25 MG tablet Commonly known as: MAXZIDE-25 TAKE 1 TABLET BY MOUTH EVERY DAY   UNABLE TO FIND Take 2 tablets by mouth at bedtime. Med Name: magnesium L theronate  650 mg at HS   VITAMIN D3 PO Take 5,000 Units by mouth.   Voltaren 1 % Gel Generic drug: diclofenac Sodium Apply 1 Application topically 4 (four) times daily as needed.   WOMENS MULTIVITAMIN PO Take by mouth.   zinc gluconate 50 MG tablet Take 50 mg by mouth daily.        No Known Allergies  Consultations: Neurology   Procedures/Studies: MR BRAIN WO CONTRAST  Result Date: 09/09/2022 CLINICAL DATA:  Stroke suspected, slurred speech EXAM: MRI HEAD WITHOUT CONTRAST TECHNIQUE: Multiplanar, multiecho pulse sequences of the brain and surrounding structures were obtained without intravenous contrast. COMPARISON:  12/12/2020 MRI head, 09/09/2022 CT head  FINDINGS: Brain: No restricted diffusion to suggest acute or subacute infarct. No acute hemorrhage, mass, mass effect, or midline shift. No hydrocephalus or extra-axial collection. Normal pituitary and craniocervical junction. No hemosiderin deposition to suggest remote hemorrhage. Mineralization in the basal ganglia. Cerebral volume is within normal limits for age. Scattered T2 hyperintense signal in the periventricular white matter, likely the sequela of mild chronic small vessel ischemic disease. Vascular: Normal arterial flow voids. Skull and upper cervical spine: Normal marrow signal. Sinuses/Orbits: Clear paranasal sinuses. No acute finding in the orbits. Other: Trace fluid in left mastoid air cells. IMPRESSION: No acute intracranial process. No evidence of acute or subacute infarct. Electronically Signed   By: Wiliam Ke M.D.   On: 09/09/2022 20:35   CT ANGIO HEAD NECK W WO CM  Result Date: 09/09/2022 CLINICAL DATA:  Neuro deficit, acute, stroke suspected. Slurred speech beginning at 10:30 a.m. today. Numbness in the tip of tongue for 1 week. Weakness in the left upper extremity. EXAM: CT ANGIOGRAPHY HEAD AND NECK WITH AND WITHOUT CONTRAST TECHNIQUE: Multidetector CT imaging of the head and neck was performed using the standard protocol during bolus administration of intravenous contrast.  Multiplanar CT image reconstructions and MIPs were obtained to evaluate the vascular anatomy. Carotid stenosis measurements (when applicable) are obtained utilizing NASCET criteria, using the distal internal carotid diameter as the denominator. RADIATION DOSE REDUCTION: This exam was performed according to the departmental dose-optimization program which includes automated exposure control, adjustment of the mA and/or kV according to patient size and/or use of iterative reconstruction technique. CONTRAST:  75mL OMNIPAQUE IOHEXOL 350 MG/ML SOLN COMPARISON:  CT head without contrast 09/09/22. MR head without and with  contrast 12/14/2020 FINDINGS: CTA NECK FINDINGS Aortic arch: Common origin of the left common carotid artery and innominate artery noted. Minimal calcification is present at the distal aortic arch. No significant calcification or stenosis is present at the great vessel origins. Right carotid system: The right common carotid artery is within normal limits. The bifurcation is unremarkable. The cervical right ICA is mildly tortuous. Two separate segments of beading along the wall of the artery are noted. No focal stenosis is associated. Left carotid system: The left common carotid artery is within normal limits. Atherosclerotic calcifications are present at the distal common artery and bifurcation without significant stenosis. Mild tortuosity is present in the cervical left ICA. Beaded appearance of the artery is most evident just below the skull base. Vertebral arteries: The left vertebral artery is slightly dominant to the right. Both vertebral arteries originate from the subclavian arteries without significant stenosis. No significant stenosis is present in either vertebral artery in the neck. Skeleton: Slight anterolisthesis is present at C4-5. Alignment is otherwise anatomic. Straightening of the normal cervical lordosis is present. Other neck: Soft tissues the neck are otherwise unremarkable. Salivary glands are within normal limits. Thyroid is normal. No significant adenopathy is present. No focal mucosal or submucosal lesions are present. Upper chest: The lung apices are clear. The thoracic inlet is within normal limits. Review of the MIP images confirms the above findings CTA HEAD FINDINGS Anterior circulation: The internal carotid arteries are within normal limits from the high cervical segments through the ICA termini bilaterally. The A1 and M1 segments are normal. The anterior communicating artery is patent. The MCA bifurcations are normal bilaterally. The ACA and MCA branch vessels are normal bilaterally.  Posterior circulation: The PICA origins are visualized and normal. The vertebral arteries and vertebrobasilar junction are normal. The basilar artery is within normal limits. Superior cerebellar arteries are patent. Both posterior cerebral arteries originate from basilar tip. The PCA branch vessels are normal bilaterally. Venous sinuses: The dural sinuses are patent. The straight sinus and deep cerebral veins are intact. Cortical veins are within normal limits. No significant vascular malformation is evident. Anatomic variants: None Review of the MIP images confirms the above findings IMPRESSION: 1. Beaded appearance of the cervical internal carotid arteries bilaterally consistent with fibromuscular dysplasia. 2. No significant associated stenosis. 3. Atherosclerotic changes at the left carotid bifurcation without significant stenosis. 4. Normal variant CTA Circle of Willis without significant proximal stenosis, aneurysm, or branch vessel occlusion. Electronically Signed   By: Marin Roberts M.D.   On: 09/09/2022 17:13   CT HEAD CODE STROKE WO CONTRAST  Result Date: 09/09/2022 CLINICAL DATA:  Code stroke. Slurred speech beginning at 10:30 AMS. Numbness in the tip of tongue for 1 week. Weakness in the left upper extremity. EXAM: CT HEAD WITHOUT CONTRAST TECHNIQUE: Contiguous axial images were obtained from the base of the skull through the vertex without intravenous contrast. RADIATION DOSE REDUCTION: This exam was performed according to the departmental dose-optimization program which includes automated exposure  control, adjustment of the mA and/or kV according to patient size and/or use of iterative reconstruction technique. COMPARISON:  MR head without and with contrast 12/14/2020 FINDINGS: Brain: No acute infarct, hemorrhage, or mass lesion is present. Stable subcortical white matter hypointensities noted. Deep brain nuclei are within normal limits. The ventricles are of normal size. No significant  extraaxial fluid collection is present. The brainstem and cerebellum are within normal limits. Midline structures are within normal limits. Vascular: No hyperdense vessel or unexpected calcification. Skull: Calvarium is intact. No focal lytic or blastic lesions are present. No significant extracranial soft tissue lesion is present. Sinuses/Orbits: The paranasal sinuses and mastoid air cells are clear. The globes and orbits are within normal limits. ASPECTS [facility] Stroke Program Early CT Score) - Ganglionic level infarction (caudate, lentiform nuclei, internal capsule, insula, M1-M3 cortex): 7/7 - Supraganglionic infarction (M4-M6 cortex): 3/3 Total score (0-10 with 10 being normal): 10/10 IMPRESSION: 1. No acute intracranial abnormality or significant interval change. 2. Stable subcortical white matter hypointensities. This likely reflects the sequela of chronic microvascular ischemia. 3. Aspects is 10/10. The above was relayed via text pager to Dr. Pilar Jarvis on 09/09/2022 at 12:35 . Electronically Signed   By: Marin Roberts M.D.   On: 09/09/2022 12:37      Subjective: Seen and examined on the day of discharge.  Stable no distress.  Appropriate for discharge home.  Discharge Exam: Vitals:   09/10/22 0730 09/10/22 1205  BP: 133/72 137/64  Pulse: 70 66  Resp: 16 14  Temp: 97.8 F (36.6 C) 98.3 F (36.8 C)  SpO2: 96% 100%   Vitals:   09/09/22 2352 09/10/22 0501 09/10/22 0730 09/10/22 1205  BP: (!) 126/59 120/64 133/72 137/64  Pulse: 71 73 70 66  Resp: 12 12 16 14   Temp: 97.9 F (36.6 C) 97.6 F (36.4 C) 97.8 F (36.6 C) 98.3 F (36.8 C)  TempSrc:    Oral  SpO2: 95% 96% 96% 100%  Weight:      Height:        General: Pt is alert, awake, not in acute distress Cardiovascular: RRR, S1/S2 +, no rubs, no gallops Respiratory: CTA bilaterally, no wheezing, no rhonchi Abdominal: Soft, NT, ND, bowel sounds + Extremities: no edema, no cyanosis    The results of significant  diagnostics from this hospitalization (including imaging, microbiology, ancillary and laboratory) are listed below for reference.     Microbiology: No results found for this or any previous visit (from the past 240 hour(s)).   Labs: BNP (last 3 results) No results for input(s): "BNP" in the last 8760 hours. Basic Metabolic Panel: Recent Labs  Lab 09/09/22 1300  NA 134*  K 3.6  CL 98  CO2 28  GLUCOSE 114*  BUN 14  CREATININE 0.60  CALCIUM 8.7*   Liver Function Tests: Recent Labs  Lab 09/09/22 1300  AST 39  ALT 37  ALKPHOS 74  BILITOT 0.5  PROT 7.4  ALBUMIN 4.4   No results for input(s): "LIPASE", "AMYLASE" in the last 168 hours. No results for input(s): "AMMONIA" in the last 168 hours. CBC: Recent Labs  Lab 09/09/22 1300  WBC 6.7  NEUTROABS 4.7  HGB 12.4  HCT 37.1  MCV 92.1  PLT 240   Cardiac Enzymes: No results for input(s): "CKTOTAL", "CKMB", "CKMBINDEX", "TROPONINI" in the last 168 hours. BNP: Invalid input(s): "POCBNP" CBG: Recent Labs  Lab 09/09/22 1223  GLUCAP 116*   D-Dimer No results for input(s): "DDIMER" in the last 72  hours. Hgb A1c Recent Labs    09/09/22 1300  HGBA1C 5.2   Lipid Profile Recent Labs    09/10/22 0545  CHOL 153  HDL 61  LDLCALC 79  TRIG 64  CHOLHDL 2.5   Thyroid function studies No results for input(s): "TSH", "T4TOTAL", "T3FREE", "THYROIDAB" in the last 72 hours.  Invalid input(s): "FREET3" Anemia work up No results for input(s): "VITAMINB12", "FOLATE", "FERRITIN", "TIBC", "IRON", "RETICCTPCT" in the last 72 hours. Urinalysis    Component Value Date/Time   COLORURINE STRAW (A) 01/31/2019 1835   APPEARANCEUR CLEAR 01/31/2019 1835   LABSPEC 1.004 (L) 01/31/2019 1835   PHURINE 7.0 01/31/2019 1835   GLUCOSEU NEGATIVE 01/31/2019 1835   HGBUR SMALL (A) 01/31/2019 1835   BILIRUBINUR NEGATIVE 01/31/2019 1835   KETONESUR 20 (A) 01/31/2019 1835   PROTEINUR NEGATIVE 01/31/2019 1835   UROBILINOGEN 0.2  05/07/2013 1540   NITRITE NEGATIVE 01/31/2019 1835   LEUKOCYTESUR NEGATIVE 01/31/2019 1835   Sepsis Labs Recent Labs  Lab 09/09/22 1300  WBC 6.7   Microbiology No results found for this or any previous visit (from the past 240 hour(s)).   Time coordinating discharge: Over 30 minutes  SIGNED:   Tresa Moore, MD  [facility] 09/10/2022, 1:38 PM Pager   If 7PM-7AM, please contact night-coverage

## 2022-09-10 NOTE — Plan of Care (Signed)

## 2022-09-10 NOTE — Progress Notes (Signed)
PT Cancellation Note  Patient Details Name: SHTERNA FULTZ MRN: [id number] DOB: 1955/10/25   Cancelled Treatment:    Reason Eval/Treat Not Completed: PT screened, no needs identified, will sign off (Observed patient ambulating independently in the room. Please re-consult if PT needs arise.)  Donna Bernard, PT, MPT   Ina Homes 09/10/2022, 1:29 PM

## 2022-09-12 ENCOUNTER — Encounter: Payer: Self-pay | Admitting: Diagnostic Neuroimaging

## 2022-09-13 ENCOUNTER — Telehealth: Payer: Self-pay

## 2022-09-13 ENCOUNTER — Encounter: Payer: Self-pay | Admitting: Family Medicine

## 2022-09-13 ENCOUNTER — Other Ambulatory Visit: Payer: Self-pay | Admitting: Diagnostic Neuroimaging

## 2022-09-13 ENCOUNTER — Telehealth: Payer: Self-pay | Admitting: Physician Assistant

## 2022-09-13 NOTE — Telephone Encounter (Signed)
Patient will start Linzess 290 mcg and I cap full of Miralax daily. Pantoprazole.for gastritis. Patient scheduled for follow up appointment in a few weeks.

## 2022-09-13 NOTE — Transitions of Care (Post Inpatient/ED Visit) (Signed)
09/13/2022  Name: Theresa Martin MRN: [id number] DOB: 1956-03-19  Today's TOC FU Call Status: Today's TOC FU Call Status:: Successful TOC FU Call Competed TOC FU Call Complete Date: 09/13/22  Transition Care Management Follow-up Telephone Call Date of Discharge: 09/10/22 Discharge Facility: [facility]) Type of Discharge: Inpatient Admission How have you been since you were released from the hospital?: Better Any questions or concerns?: No  Items Reviewed: Did you receive and understand the discharge instructions provided?: Yes Medications obtained,verified, and reconciled?: Yes (Medications Reviewed) Any new allergies since your discharge?: No Dietary orders reviewed?: NA Do you have support at home?: No People in Home: alone Name of Support/Comfort Primary Source: children 30 mins away  Medications Reviewed Today: Medications Reviewed Today     Reviewed by Howell Rucks, CPhT (Pharmacy Technician) on 09/09/22 at 2048  Med List Status: Complete   Medication Order Taking? Sig Documenting Provider Last Dose Status Informant  albuterol (VENTOLIN HFA) 108 (90 Base) MCG/ACT inhaler [id number] Yes TAKE 2 PUFFS BY MOUTH EVERY 6 HOURS AS NEEDED FOR WHEEZE OR SHORTNESS OF BREATH Sheliah Hatch, MD unk Active Self, Pharmacy Records  atorvastatin (LIPITOR) 20 MG tablet [id number] Yes TAKE 1 TABLET BY MOUTH EVERY DAY Sheliah Hatch, MD 09/09/2022 0900 Active Self, Pharmacy Records  budesonide-formoterol [facility]) 160-4.5 MCG/ACT inhaler [id number] Yes Inhale 2 puffs into the lungs 2 (two) times daily. in the morning and at bedtime.  Patient taking differently: Inhale 1 puff into the lungs daily.   Julio Sicks, NP 09/09/2022 0900 Active Self, Pharmacy Records  Cholecalciferol (VITAMIN D3 PO) [id number] Yes Take 5,000 Units by mouth. [provider] 09/09/2022 0900 Active Self, Pharmacy Records  clonazePAM (KLONOPIN) 0.5 MG tablet  [id number] Yes Take 0.5 mg by mouth daily as needed. [provider] Past Week Active Self, Pharmacy Records  diclofenac Sodium (VOLTAREN) 1 % GEL [id number] Yes Apply 1 Application topically 4 (four) times daily as needed. [provider] unk Active Self, Pharmacy Records  ELDERBERRY PO [id number] Yes Take by mouth. [provider] 09/09/2022 Active Self, Pharmacy Records  eletriptan (RELPAX) 40 MG tablet [id number] Yes Take 1 tablet (40 mg total) by mouth as needed for migraine or headache. May repeat in 2 hours if headache persists or recurs. Penumalli, Glenford Bayley, MD unk Active Self, Pharmacy Records  escitalopram (LEXAPRO) 10 MG tablet [id number] Yes Take 10 mg by mouth daily. [provider] 09/09/2022 Active Self, Pharmacy Records  Fremanezumab-vfrm (AJOVY) 225 MG/1.5ML SOAJ [id number] No INJECT 225 MG INTO THE SKIN EVERY 30 (THIRTY) DAYS. Suanne Marker, MD 08/16/2022 Active Self, Pharmacy Records  Fremanezumab-vfrm (AJOVY) 225 MG/1.5ML Ivory Broad [id number] Yes Inject 225 mg into the skin every 30 (thirty) days. Suanne Marker, MD 08/16/2022 Active Self, Pharmacy Records  gabapentin (NEURONTIN) 400 MG capsule [id number] Yes TAKE 1 CAPSULE BY MOUTH 2 TIMES DAILY. Suanne Marker, MD 09/09/2022 0900 Active Self, Pharmacy Records  latanoprost (XALATAN) 0.005 % ophthalmic solution [id number] Yes Place 1 drop into both eyes at bedtime. Sheliah Hatch, MD 09/08/2022 Active Self, Pharmacy Records  linaclotide [facility]) 290 MCG CAPS capsule [id number] No Take 1 capsule (290 mcg total) by mouth daily before breakfast.  Patient not taking: Reported on 09/09/2022   Celso Amy, PA-C Not Taking Active Self, Pharmacy Records           Med Note [facility] ALPena, MELISSA A   Thu Sep 09, 2022  8:46 PM) "See note" Pt has not started it  yet   Misc [employer] (JOINT HEALTH PO) [id number] Yes Take by mouth. [provider] 09/09/2022 Active Self, Pharmacy Records   montelukast (SINGULAIR) 10 MG tablet [id number] Yes TAKE 1 TABLET BY MOUTH EVERYDAY AT BEDTIME Sheliah Hatch, MD 09/08/2022 Active Self, Pharmacy Records  Multiple Vitamins-Minerals (WOMENS MULTIVITAMIN PO) [id number] Yes Take by mouth. [provider] 09/09/2022 Active Self, Pharmacy Records  pantoprazole (PROTONIX) 40 MG tablet [id number] Yes Take 1 tablet (40 mg total) by mouth daily. Celso Amy, PA-C 09/09/2022 0900 Active Self, Pharmacy Records  Probiotic Product (PROBIOTIC PO) [id number] Yes Take by mouth. [provider] 09/09/2022 Active Self, Pharmacy Records  QUEtiapine (SEROQUEL XR) 400 MG 24 hr tablet [id number] Yes Take 1 tablet (400 mg total) by mouth at bedtime.  Patient taking differently: Take 800 mg by mouth at bedtime.   Money, Gerlene Burdock, [place] 09/08/2022 2200 Active Self, Pharmacy Records  Rimegepant Sulfate (NURTEC) 75 MG TBDP [id number] Yes Take 1 tablet (75 mg total) by mouth as needed (for breakthrough migraine). Suanne Marker, MD  Active Self, Pharmacy Records           Med Note [facility], MELISSA A   Thu Sep 09, 2022  8:43 PM) Pt is currently rotating b/t eletriptan and rizatriptan but was told recently by her MD that she will need to come off triptans and go back to Nurtec soon, but she has not started it yet   rizatriptan (MAXALT-MLT) 10 MG disintegrating tablet [id number] Yes Take 1 tablet (10 mg total) by mouth as needed for migraine (May repeat in 2 hours if needed). Penumalli, Glenford Bayley, MD unk Active Self, Pharmacy Records  Spacer/Aero-Holding Chambers (AEROCHAMBER PLUS) inhaler [id number]  Use as instructed Erin Fulling, MD  Active Self, Pharmacy Records  temazepam (RESTORIL) 15 MG capsule [id number] Yes Take 1 capsule (15 mg total) by mouth at bedtime. Money, Gerlene Burdock, [place] 09/08/2022 2200 Active Self, Pharmacy Records  triamterene-hydrochlorothiazide [facility]) 37.5-25 MG tablet [id number] Yes TAKE 1 TABLET BY MOUTH EVERY DAY Sheliah Hatch, MD  09/09/2022 0900 Active Self, Pharmacy Records  Turmeric (QC TUMERIC COMPLEX) 500 MG CAPS [id number] Yes Take by mouth. [provider] 09/09/2022 Active Self, Pharmacy Records  UNABLE TO FIND [id number] Yes Take 2 tablets by mouth at bedtime. Med Name: magnesium L theronate  650 mg at [facility] [provider] 09/09/2022 Active Self, Pharmacy Records  zinc gluconate 50 MG tablet [id number] Yes Take 50 mg by mouth daily. [provider] 09/09/2022 Active Self, Pharmacy Records  Med List Note Art Buff, CPhT 01/31/19 2134): Brother helped with meds            Home Care and Equipment/Supplies: Were Home Health Services Ordered?: No Has Agency set up a time to come to your home?: No Any new equipment or medical supplies ordered?: No  Functional Questionnaire: Do you need assistance with bathing/showering or dressing?: No Do you need assistance with meal preparation?: No Do you need assistance with eating?: No Do you have difficulty maintaining continence: No Do you need assistance with getting out of bed/getting out of a chair/moving?: No Do you have difficulty managing or taking your medications?: No  Follow up appointments reviewed: PCP Follow-up appointment confirmed?: No (pt declind wants to see neurology) Specialist Hospital Follow-up appointment confirmed?: Yes Date of Specialist follow-up appointment?: 09/14/22 Follow-Up Specialty Provider:: [facility] Do you need transportation to your follow-up appointment?: No Do you understand care options if your condition(s) worsen?: Yes-patient verbalized understanding  SIGNATURE TB,CMA

## 2022-09-13 NOTE — Telephone Encounter (Signed)
FYI, the pathology results did not come to my in basket.  These were directly scanned into patient's chart and were visible to her on MyChart. This has been a frequent occurrence which needs to be addressed  [name],  Please review her pathology results  Thanks RV

## 2022-09-13 NOTE — Telephone Encounter (Signed)
Pt left message to discuss needing to change her dosage of linzess since she had a bowel movement over the weekend please return call

## 2022-09-13 NOTE — Telephone Encounter (Signed)
Patient had EGD and colonoscopy on 09/02/2022. She is calling for results

## 2022-09-14 ENCOUNTER — Ambulatory Visit (INDEPENDENT_AMBULATORY_CARE_PROVIDER_SITE_OTHER): Payer: Medicare Other | Admitting: Diagnostic Neuroimaging

## 2022-09-14 ENCOUNTER — Encounter: Payer: Self-pay | Admitting: Diagnostic Neuroimaging

## 2022-09-14 VITALS — BP 123/68 | HR 85 | Ht 61.0 in | Wt 118.4 lb

## 2022-09-14 DIAGNOSIS — G43109 Migraine with aura, not intractable, without status migrainosus: Secondary | ICD-10-CM

## 2022-09-14 NOTE — Progress Notes (Signed)
[facility]  PATIENT: Theresa Martin DOB: 04-Dec-1955  REFERRING CLINICIAN: Sheliah Hatch, MD  HISTORY FROM: patient REASON FOR VISIT: follow up   HISTORICAL  CHIEF COMPLAINT:  Chief Complaint  Patient presents with   Follow-up    Patient in room #6 and alone. Patient states she here to f/u with her ER visit.    HISTORY OF PRESENT ILLNESS:   UPDATE (09/14/22, VRP): Since last visit, doing well until 09/10/22 had onset of slurred speech, tongue numbness at pulmonary appt, then went to ER for eval. MRI, CTA negative, except for some fibromuscular dysplasia findings incidentally noted. Could have been complicated migraine.    UPDATE (05/26/22, VRP): Since last visit, doing well. Symptoms are improved. Now doing well with ajovy injections. 1-2 HA per month. Mainly stress associated. Nurtec and rizatriptan working well. Depression still an issue.   UPDATE (05/25/21, VRP): Since last visit, doing well, except recent injection site reaction; used same ajovy needle 3 times (05/20/21), b/c of retained medication in needle. Then had red and swelling around site. Now on steroid and abx, and sxs getting better. Never had this issue before.  UPDATE (11/17/20, VRP): Since last visit, doing well until 3 weeks ago; returned from vacation with family (camper Zenaida Niece; fun but stressful). Then almost daily left sided throbbing HA, left side numbness. Tried gabapentin increase, prednisone, indomethacin; no relief. Phonophobia. No nausea.   UPDATE (11/06/20, MM): 67 year old female with a history of migraine headaches.  She returns today for follow-up.  She reports that she has had an ongoing headache for 2 weeks.  Reports that it starts on the left side behind the eye.  She reports that she has a tingling sensation that radiates down the face and down the arm into the leg and toes.  She has been taking an over-the-counter medication Tylenol and Advil on a daily basis.  Her sports  medicine doctor saw her in the last 2 weeks and did an injection.  She is not sure what the injection was.  His notes is not in epic.  She also reports that she was given a prescription for tramadol.  She states that several years ago she had a severe headache with tingling sensations down the left side of the body and went to the emergency room.  Reports that she had a complete stroke work-up that was negative.  Patient had MRI of the brain with and without contrast in June 2021 that was unremarkable.  She remains on gabapentin 400 mg twice a day.  Reports that this has been controlling her headaches up until the last 2 weeks.  UPDATE (08/08/19, VRP): Since last visit, doing well, except past 3 months, has left eye pulling sensation, left eye pain, then left head pain. Some sensations in right eye. Also with mild glaucoma.   UPDATE (03/21/19, VRP): Since last visit, doing well until COVID pandemic. She weaned herself off her psych meds, then mania, psychosis . Then headaches worsened. Admitted to behavioral health. Now psych issues are better, and headache resolved since last month.   UPDATE (01/30/18, VRP): Since last visit, doing worse with HA (14-15 per month). More stress levels than last visit. Burning sensation in scalp continues.  UPDATE (08/24/17, VRP): Since last visit, doing well. Tolerating propranolol. Avg 4-13 migraine per month. No alleviating or aggravating factors.   Separately, had right thoracic burning pain (no rash) in fall 2018, and was tx'd empirically with acyclovir. Then with right scalp burning pain last week;  also with chronic left scalp sensitivity with migraines.  UPDATE 08/24/16: Since last visit, avg 2-3 HA per month. Tolerating meds. No new issues. Mood stable. Weight improved.   UPDATE 02/23/16: Since last visit has ~ 1-5 HA per month. Tolerating propranolol + OTC tylenol/aleve for HA mgmt.   UPDATE 08/19/15: Since last visit, avg 1-10 HA per month. Usually with stress.  Some more wt gain noted (~10lbs). Also had a a laser eye procedure for angle closure glaucoma, now improved.  UPDATE 04/15/15: Since last visit, doing well. No more migraine since 02/28/15. Propranolol is helping prevent HA. Mood stable.  UPDATE 01/13/15: Since last visit, having more headaches, and more fluid retention. Now being tapered off gabapentin by psychiatry due to side effects.   UPDATE 09/02/14: Doing well. Avg 3-6 days HA per month. Some HA are mild, some are severe. Taking gabapentin 200mg  TID. Works out every day at [employer]. Overall mood is better as well.  UPDATE 05/17/14: Since last visit, patient had some psychiatry issues, was managed by behavioral health, and her headaches significant improved. She was doing well for several years and did not follow-up in our clinic. Patient here with her father today for this visit. Patient's father notes that her headaches seem to be significantly associated with her stress levels. In the last few months her stress levels have increased significantly. Her stress levels related to her ex-husband and her daughters, and some family issues. Patient having intermittent, almost daily left-sided headaches, with numbness and tingling in the left scalp, typically in the evening when she is home. During the daytime patient stays active, works out several times a week, and does better. Patient had been on gabapentin 100 mg at bedtime for several years. Her psychiatrist increased this to 2 and then 3 capsules at bedtime. When she did 3 capsules at bedtime, her headache type symptoms paradoxical he worsened. She then went back to taking one capsule at bedtime. Strangely, she reports that she was told she was able to take this medication (gabapentin) as needed as well and for the last few weeks has been taking one capsule at bedtime, followed by 1-2 capsules every hour, throughout the night, taking up to 10-14 capsules in a 12 hour period.   PRIOR HPI (11/04/08 - 06/03/09,  VRP): 67 year old right-handed female with history of high blood pressure, depression and anxiety presenting for evaluation of chronic headaches and abnormal MRI scan.  On October 26, 2008 the patient presented to the emergency room for right-sided chest pain and was diagnosed with pneumonia.   At this time her headaches worsened in severity.  Upon review of prior MRIs demonstrating microvascular gliosis, patient was referred to our neurology clinic for futher evaluation. Patient's headaches began in 2005 consisting of a "dull pressure like sensation" over the top of her head. Occasionally these are associated with mild nausea without vomiting as well as intermittent left facial numbness.  Patient had dull nagging low level headaches on a daily basis with daily flareups involving a hot sensation over her scalp.  Sometimes this sensation starts as posterior neck pain that moves up the back of her head. She was initially evaluated with MRI scans in 2005 and 2006 and started on topiramate 100 mg at bedtime.   In addition she was taking Advil and Tylenol over-the-counter almost a daily basis.   During flareups the patient would have to lay down in place ice packs over her eyes and the top of her head.   Patient's  headaches are aggravated by lack of sleep or stress. No food triggers noted.  Her other physicians decided to taper her off of NSAIDs, tylenol and also her topiramate. Patient had MRI of the head and neck, found to have some nonspecific white matter lesions. Patient had lumbar puncture and additional blood testing, but multiple sclerosis was ruled out. Patient was treated with several occipital nerve blocks with good results.   REVIEW OF SYSTEMS: Full 14 system review of systems performed and negative except for: as per HPI.   ALLERGIES: No Known Allergies  HOME MEDICATIONS: Outpatient Medications Prior to Visit  Medication Sig Dispense Refill   eletriptan (RELPAX) 40 MG tablet Take 1 tablet (40 mg  total) by mouth as needed for migraine or headache. May repeat in 2 hours if headache persists or recurs. 10 tablet 0   Fremanezumab-vfrm (AJOVY) 225 MG/1.5ML SOAJ Inject 225 mg into the skin every 30 (thirty) days. 4.5 mL 1   QUEtiapine (SEROQUEL XR) 400 MG 24 hr tablet Take 1 tablet (400 mg total) by mouth at bedtime. (Patient taking differently: Take 800 mg by mouth at bedtime.) 30 tablet 1   rizatriptan (MAXALT-MLT) 10 MG disintegrating tablet Take 1 tablet (10 mg total) by mouth as needed for migraine (May repeat in 2 hours if needed). 10 tablet 12   albuterol (VENTOLIN HFA) 108 (90 Base) MCG/ACT inhaler TAKE 2 PUFFS BY MOUTH EVERY 6 HOURS AS NEEDED FOR WHEEZE OR SHORTNESS OF BREATH 18 g 2   atorvastatin (LIPITOR) 20 MG tablet TAKE 1 TABLET BY MOUTH EVERY DAY 90 tablet 1   budesonide-formoterol (SYMBICORT) 160-4.5 MCG/ACT inhaler Inhale 2 puffs into the lungs 2 (two) times daily. in the morning and at bedtime. (Patient taking differently: Inhale 1 puff into the lungs daily.) 10.2 each 5   Cholecalciferol (VITAMIN D3 PO) Take 5,000 Units by mouth.     clonazePAM (KLONOPIN) 0.5 MG tablet Take 0.5 mg by mouth daily as needed.     diclofenac Sodium (VOLTAREN) 1 % GEL Apply 1 Application topically 4 (four) times daily as needed.     ELDERBERRY PO Take by mouth.     escitalopram (LEXAPRO) 10 MG tablet Take 10 mg by mouth daily.     latanoprost (XALATAN) 0.005 % ophthalmic solution Place 1 drop into both eyes at bedtime. 2.5 mL 12   linaclotide (LINZESS) 290 MCG CAPS capsule Take 1 capsule (290 mcg total) by mouth daily before breakfast. (Patient not taking: Reported on 09/09/2022) 30 capsule 5   Misc Natural Products (JOINT HEALTH PO) Take by mouth.     montelukast (SINGULAIR) 10 MG tablet TAKE 1 TABLET BY MOUTH EVERYDAY AT BEDTIME 90 tablet 1   Multiple Vitamins-Minerals (WOMENS MULTIVITAMIN PO) Take by mouth.     pantoprazole (PROTONIX) 40 MG tablet Take 1 tablet (40 mg total) by mouth daily. 30  tablet 5   Probiotic Product (PROBIOTIC PO) Take by mouth.     Rimegepant Sulfate (NURTEC) 75 MG TBDP Take 1 tablet (75 mg total) by mouth as needed (for breakthrough migraine). (Patient not taking: Reported on 09/14/2022) 8 tablet 12   Spacer/Aero-Holding Chambers (AEROCHAMBER PLUS) inhaler Use as instructed 1 each 2   temazepam (RESTORIL) 15 MG capsule Take 1 capsule (15 mg total) by mouth at bedtime. 30 capsule 0   triamterene-hydrochlorothiazide (MAXZIDE-25) 37.5-25 MG tablet TAKE 1 TABLET BY MOUTH EVERY DAY 90 tablet 1   Turmeric (QC TUMERIC COMPLEX) 500 MG CAPS Take by mouth.     UNABLE  TO FIND Take 2 tablets by mouth at bedtime. Med Name: magnesium L theronate  650 mg at HS     zinc gluconate 50 MG tablet Take 50 mg by mouth daily.     Fremanezumab-vfrm (AJOVY) 225 MG/1.5ML SOAJ INJECT 225 MG INTO THE SKIN EVERY 30 (THIRTY) DAYS. 225 mL 0   gabapentin (NEURONTIN) 400 MG capsule TAKE 1 CAPSULE BY MOUTH 2 TIMES DAILY. 60 capsule 12   No facility-administered medications prior to visit.    PAST MEDICAL HISTORY: Past Medical History:  Diagnosis Date   Allergy    Anxiety    Asthma    Depression    Hyperlipidemia    Hypertension    Migraines    Osteopenia after menopause    Persistent headaches    Pneumonia 2020   Postherpetic neuralgia    Renal disorder    Shingles outbreak 02/2014   Skin cancer    moles on right groin and right buttock.   Slurred speech 09/09/2022    PAST SURGICAL HISTORY: Past Surgical History:  Procedure Laterality Date   CARDIOVASCULAR STRESS TEST  02/2009   treadmill stress test: Low risk   CESAREAN SECTION  1987, 1989   COLONOSCOPY     2014/2015 [place], normal   COLONOSCOPY WITH PROPOFOL N/A 10/20/2021   Procedure: COLONOSCOPY WITH PROPOFOL;  Surgeon: Wyline Mood, MD;  Location: [facility] ENDOSCOPY;  Service: Gastroenterology;  Laterality: N/A;   COLONOSCOPY WITH PROPOFOL N/A 09/02/2022   Procedure: COLONOSCOPY WITH PROPOFOL;  Surgeon: Toney Reil, MD;  Location: [facility] ENDOSCOPY;  Service: Gastroenterology;  Laterality: N/A;  Requests about 11:15 arrival   DIAGNOSTIC LAPAROSCOPY     ESOPHAGOGASTRODUODENOSCOPY (EGD) WITH PROPOFOL N/A 09/02/2022   Procedure: ESOPHAGOGASTRODUODENOSCOPY (EGD) WITH PROPOFOL;  Surgeon: Toney Reil, MD;  Location: [facility] ENDOSCOPY;  Service: Gastroenterology;  Laterality: N/A;   EXPLORATORY LAPAROTOMY     laproscopy for infertility   EYE SURGERY Bilateral    laser-correct opening b/tn cornea and iris   MOLE REMOVAL  2017   x 2 moles    FAMILY HISTORY: Family History  Problem Relation Age of Onset   Hypertension Mother    Coronary artery disease Mother    Heart failure Mother    Transient ischemic attack Mother    Hypertension Father    Liver disease Sister        liver failure   Migraines Sister    Arrhythmia Brother    Hypertension Brother    Cancer Neg Hx        colon    Breast cancer Neg Hx     SOCIAL HISTORY:  Social History   Socioeconomic History   Marital status: Legally Separated    Spouse name: Not on file   Number of children: 2   Years of education: Not on file   Highest education level: 12th grade  Occupational History   Occupation: Geologist, engineering   Occupation: retired  Tobacco Use   Smoking status: Never   Smokeless tobacco: Never  Building services engineer Use: Never used  Substance and Sexual Activity   Alcohol use: No    Alcohol/week: 0.0 standard drinks of alcohol   Drug use: No   Sexual activity: Not Currently  Other Topics Concern   Not on file  Social History Narrative   Regular exercise- yes, spins daily   Diet: fruits and veggies, water    No caffeine   Lives alone    Social Determinants of Health  Financial Resource Strain: Low Risk  (07/31/2022)   Overall Financial Resource Strain (CARDIA)    Difficulty of Paying Living Expenses: Not hard at all  Food Insecurity: No Food Insecurity (09/09/2022)   Hunger Vital Sign    Worried About Running  Out of Food in the Last Year: Never true    Ran Out of Food in the Last Year: Never true  Transportation Needs: No Transportation Needs (09/09/2022)   PRAPARE - Administrator, Civil Service (Medical): No    Lack of Transportation (Non-Medical): No  Physical Activity: Insufficiently Active (07/31/2022)   Exercise Vital Sign    Days of Exercise per Week: 2 days    Minutes of Exercise per Session: 20 min  Stress: Stress Concern Present (07/31/2022)   [employer] of Occupational Health - Occupational Stress Questionnaire    Feeling of Stress : Rather much  Social Connections: Moderately Integrated (07/31/2022)   Social Connection and Isolation Panel [NHANES]    Frequency of Communication with Friends and Family: More than three times a week    Frequency of Social Gatherings with Friends and Family: Once a week    Attends Religious Services: More than 4 times per year    Active Member of [employer] or Organizations: Yes    Attends Banker Meetings: More than 4 times per year    Marital Status: Separated  Intimate Partner Violence: Not At Risk (09/09/2022)   Humiliation, Afraid, Rape, and Kick questionnaire    Fear of Current or Ex-Partner: No    Emotionally Abused: No    Physically Abused: No    Sexually Abused: No     PHYSICAL EXAM  Vitals:   09/14/22 1027  BP: 123/68  Pulse: 85  Weight: 118 lb 6.4 oz (53.7 kg)  Height: 5\' 1"  (1.549 m)   Wt Readings from Last 3 Encounters:  09/14/22 118 lb 6.4 oz (53.7 kg)  09/09/22 120 lb (54.4 kg)  09/09/22 121 lb 12.8 oz (55.2 kg)   Body mass index is 22.37 kg/m.  No results found.     06/03/2016    3:23 PM  MMSE - Mini Mental State Exam  Orientation to time 5  Orientation to Place 5  Registration 3  Attention/ Calculation 5  Recall 3  Language- name 2 objects 2  Language- repeat 1  Language- follow 3 step command 3  Language- read & follow direction 1  Write a sentence 1  Copy design 1  Total score 30     GENERAL EXAM: Patient is in no distress; well developed, nourished and groomed; neck is supple  CARDIOVASCULAR: Regular rate and rhythm, no murmurs, no carotid bruits  NEUROLOGIC: MENTAL STATUS: awake, alert, language fluent, comprehension intact, naming intact, fund of knowledge appropriate CRANIAL NERVE: pupils equal and reactive to light, visual fields full to confrontation, extraocular muscles intact, no nystagmus, facial sensation and strength symmetric, hearing intact, palate elevates symmetrically, uvula midline, shoulder shrug symmetric, tongue midline. MOTOR: normal bulk and tone, full strength in the BUE, BLE SENSORY: normal and symmetric to light touch, temperature, vibration COORDINATION: finger-nose-finger, fine finger movements normal REFLEXES: deep tendon reflexes present and symmetric GAIT/STATION: narrow based gait; romberg is negative    DIAGNOSTIC DATA (LABS, IMAGING, TESTING) - I reviewed patient records, labs, notes, testing and imaging myself where available.  Lab Results  Component Value Date   WBC 6.7 09/09/2022   HGB 12.4 09/09/2022   HCT 37.1 09/09/2022   MCV 92.1 09/09/2022  PLT 240 09/09/2022      Component Value Date/Time   NA 134 (L) 09/09/2022 1300   NA 140 12/10/2020 0954   K 3.6 09/09/2022 1300   CL 98 09/09/2022 1300   CO2 28 09/09/2022 1300   GLUCOSE 114 (H) 09/09/2022 1300   BUN 14 09/09/2022 1300   BUN 18 12/10/2020 0954   CREATININE 0.60 09/09/2022 1300   CREATININE 0.90 06/08/2017 1534   CALCIUM 8.7 (L) 09/09/2022 1300   PROT 7.4 09/09/2022 1300   PROT 6.8 12/10/2020 0954   ALBUMIN 4.4 09/09/2022 1300   ALBUMIN 4.6 12/10/2020 0954   AST 39 09/09/2022 1300   ALT 37 09/09/2022 1300   ALKPHOS 74 09/09/2022 1300   BILITOT 0.5 09/09/2022 1300   BILITOT <0.2 12/10/2020 0954   GFRNONAA >60 09/09/2022 1300   GFRAA >60 02/11/2019 0702   Lab Results  Component Value Date   CHOL 153 09/10/2022   HDL 61 09/10/2022   LDLCALC 79  09/10/2022   TRIG 64 09/10/2022   CHOLHDL 2.5 09/10/2022   Lab Results  Component Value Date   HGBA1C 5.2 09/09/2022   Lab Results  Component Value Date   VITAMINB12 1,257 (H) 01/12/2021   Lab Results  Component Value Date   TSH 0.82 08/04/2022    11/13/08 LP - opening pressure 7cm H2O; WBC 1, RBC 0, glucose 62, protein 26, OCB (2 in CSF, not seen in serum), IgG index 0.5 (normal), lyme PCR neg, EBV PCR neg  11/13/08 VEP - normal  11/08/08 MRI cervical - normal  11/08/08 MRI brain (with and without contrast) - multiple supratentorial, periventricular and juxtacortical white matter lesions which may represent chronic demyelinating plaques or perivascular gliosis.  No abnormal enhancement on postcontrast views.   08/28/17 MRI brain  1.  Scattered T2/FLAIR hyperintense foci predominantly in the deep and subcortical white matter.  This is a nonspecific finding and most likely represents chronic microvascular ischemic change.  The pattern is not typical for demyelination.  The foci are not acute and they do not enhance after contrast.  When compared to the MRI dated 05/20/2004, there has been only slight progression in the number or size of the foci. 2.  There is a normal enhancement pattern and there are no acute findings.   09/30/18 MRI brain [I reviewed images myself and agree with interpretation. -VRP]  - No acute or subacute infarction. - Numerous scattered foci of T2 and FLAIR signal within the cerebral hemispheric deep and subcortical white matter. The differential diagnosis on the basis of the imaging would be demyelinating disease versus small-vessel disease. Findings appear stable since June of last year but are progressive since 2006. Given the history of hypertension and hyperlipidemia, small-vessel disease is probably more likely  09/21/19 MRI brain - Abnormal MRI scan of the brain showing bilateral nonspecific periventricular and subcortical white matter hyperintensities with a  differential discussed above.  No enhancing lesions are noted.  There are mild incidental changes of chronic paranasal sinus inflammation.  Overall no significant change compared with previous MRI from 09/30/2018  12/16/20: This MRI of the brain with and without contrast shows the following: 1.   Scattered T2/FLAIR hyperintense foci predominantly in the subcortical and deep white matter of both hemispheres.  This is a nonspecific finding but is most consistent with chronic microvascular ischemic change.  Demyelination or vasculitis would be less likely to have this pattern.  None of the foci appear to be acute.  They do not enhance.  Compared to the MRI dated 09/21/2019, there are no new lesions. 2.   Normal enhancement pattern.  No acute findings.  09/09/22 MRI brain No acute intracranial process. No evidence of acute or subacute infarct.  09/09/22 CTA head / neck 1. Beaded appearance of the cervical internal carotid arteries bilaterally consistent with fibromuscular dysplasia. 2. No significant associated stenosis. 3. Atherosclerotic changes at the left carotid bifurcation without significant stenosis. 4. Normal variant CTA Circle of Willis without significant proximal stenosis, aneurysm, or branch vessel occlusion.    ASSESSMENT AND PLAN  67 y.o. year old female here with mixed tension and migraine headaches, left occipital neuralgia, and longer standing significant depression/anxiety. Some headaches have occipital neuralgia type features. Symptoms seem to be worse with stress and external factors.   Meds tried: gabapentin, propranolol, indomethacin, ibuprofen, tylenol, toradol, rizatriptan, eletriptan, nurtec  Dx:  Migraine with aura and without status migrainosus, not intractable    PLAN:  MIGRAINE WITH AURA (history of angle closure glaucoma; cannot use topiramate)  MIGRAINE PREVENTION  LIFESTYLE CHANGES -Stop or avoid smoking -Decrease or avoid caffeine / alcohol -Eat and  sleep on a regular schedule -Exercise several times per week - continue fremanezumab (Ajovy) 225mg  monthly  MIGRAINE RESCUE  - ibuprofen, tylenol as needed - eletriptan 40mg  as needed - if not working, then try nurtec 75mg  as needed  MILD SLEEP APNEA - follow up with pulmonary / ENT / PCP (already had home sleep study; now on oxygen)  Schizoaffective schizophrenia / anxiety / depression (per psychiatry) - continue seroquel 800mg  at bedtime  No orders of the defined types were placed in this encounter.  Return in about 9 months (around 06/14/2023).     Suanne Marker, MD 09/14/2022, 11:08 AM Certified in Neurology, Neurophysiology and Neuroimaging  [facility] [address] [place], [place] [address] [phone]

## 2022-09-14 NOTE — Patient Instructions (Signed)
  MIGRAINE PREVENTION  LIFESTYLE CHANGES -Stop or avoid smoking -Decrease or avoid caffeine / alcohol -Eat and sleep on a regular schedule -Exercise several times per week - continue fremanezumab (Ajovy) 225mg  monthly  MIGRAINE RESCUE  - ibuprofen, tylenol as needed - eletriptan 40mg  as needed - if not working, then try nurtec 75mg  as needed

## 2022-09-16 ENCOUNTER — Telehealth: Payer: Self-pay

## 2022-09-16 ENCOUNTER — Ambulatory Visit (INDEPENDENT_AMBULATORY_CARE_PROVIDER_SITE_OTHER): Payer: Medicare Other | Admitting: Family Medicine

## 2022-09-16 ENCOUNTER — Encounter: Payer: Self-pay | Admitting: Family Medicine

## 2022-09-16 ENCOUNTER — Ambulatory Visit
Admission: RE | Admit: 2022-09-16 | Discharge: 2022-09-16 | Disposition: A | Discharge status: Home / Self Care | Payer: Medicare Other | Source: Ambulatory Visit | Attending: Family Medicine | Admitting: Family Medicine

## 2022-09-16 VITALS — BP 138/60 | HR 80 | Temp 98.0°F | Resp 16 | Ht 61.0 in | Wt 121.4 lb

## 2022-09-16 DIAGNOSIS — W57XXXA Bitten or stung by nonvenomous insect and other nonvenomous arthropods, initial encounter: Secondary | ICD-10-CM

## 2022-09-16 DIAGNOSIS — R202 Paresthesia of skin: Secondary | ICD-10-CM

## 2022-09-16 DIAGNOSIS — S30861A Insect bite (nonvenomous) of abdominal wall, initial encounter: Secondary | ICD-10-CM

## 2022-09-16 DIAGNOSIS — Z1231 Encounter for screening mammogram for malignant neoplasm of breast: Secondary | ICD-10-CM | POA: Diagnosis not present

## 2022-09-16 DIAGNOSIS — R200 Anesthesia of skin: Secondary | ICD-10-CM | POA: Diagnosis not present

## 2022-09-16 MED ORDER — PERMETHRIN 5 % EX CREA: 1.0000 | TOPICAL_CREAM | Freq: Once | CUTANEOUS | 0 refills | Status: AC

## 2022-09-16 NOTE — Patient Instructions (Signed)
Follow up as needed or as scheduled We'll notify you of your lab results and make any changes if needed USE the Permethrin cream tonight- apply from the neck down, sleep in it, then shower in the morning.  Use a clean towel and change your sheets and Pjs ADD daily Claritin or Zyrtec to help w/ itching CONTINUE the Mometasone cream to help w/ itching Call with any questions or concerns Hang in there!!

## 2022-09-16 NOTE — Telephone Encounter (Signed)
Error

## 2022-09-16 NOTE — Progress Notes (Signed)
   Subjective:    Patient ID: Theresa Martin, female    DOB: 03/29/1956, 67 y.o.   MRN: [id number]  HPI Bug bites- pt reports she has 'at least 35' bug bites.  Pt reports she woke up w/ the bites.  Has stripped her bed and washed everything- including dog.  Reports they are most prevalent in the groin and under her breasts.  Pt developed 3 new lesions under IV tape while she was in the hospital.  Pt has been out walking the dog.  A few weeks ago found a dead tick in bed.  Has had numbness of tongue and L foot.  Using mometasone cream w/ some relief.   Review of Systems For ROS see HPI     Objective:   Physical Exam Vitals reviewed.  Constitutional:      General: She is not in acute distress.    Appearance: Normal appearance. She is not ill-appearing.  HENT:     Head: Normocephalic and atraumatic.  Eyes:     Extraocular Movements: Extraocular movements intact.     Conjunctiva/sclera: Conjunctivae normal.  Cardiovascular:     Pulses: Normal pulses.  Musculoskeletal:     Right lower leg: No edema.     Left lower leg: No edema.  Skin:    General: Skin is warm and dry.     Findings: Lesion (scattered groupings of unroofed/scabbing red areas in groin bilaterally, under breasts, in L elbow flexor crease, along waistband) present.  Neurological:     General: No focal deficit present.     Mental Status: She is alert and oriented to person, place, and time.  Psychiatric:        Mood and Affect: Mood normal.        Behavior: Behavior normal.        Thought Content: Thought content normal.           Assessment & Plan:  Insect bites- new.  Discussed that these are possibly scabies as they are in areas that would be consistent- groin, waistband- and are very itchy with minimal relief.  Encouraged her to watch for possible bed bugs- but she has washed and bleached all bedding.  Start permethrin cream as directed.  Antihistamine for itching.  Reviewed supportive care and red flags  that should prompt return.  Pt expressed understanding and is in agreement w/ plan.   Numbness- pt has hx of similar.  Has seen neuro.  She seems to think it is related to her skin bites/lesions.  Notes there was a dead tick in her bed.  Will get ANA and Lyme disease titers to assess for underlying cause.  Pt expressed understanding and is in agreement w/ plan.

## 2022-09-17 ENCOUNTER — Telehealth: Payer: Self-pay

## 2022-09-17 LAB — LYME DISEASE SEROLOGY W/REFLEX: Lyme Total Antibody EIA: NEGATIVE

## 2022-09-17 NOTE — Telephone Encounter (Signed)
Pt seen results Via my chart  

## 2022-09-17 NOTE — Telephone Encounter (Signed)
-----   Message from Sheliah Hatch, MD sent at 09/17/2022  4:07 PM EDT ----- No evidence of Lyme.  Great news!

## 2022-09-19 LAB — ANTI-NUCLEAR AB-TITER (ANA TITER): ANA Titer 1: 1:40 {titer} — ABNORMAL HIGH

## 2022-09-19 LAB — ANA: Anti Nuclear Antibody (ANA): POSITIVE — AB

## 2022-09-20 ENCOUNTER — Other Ambulatory Visit: Payer: Self-pay

## 2022-09-20 ENCOUNTER — Encounter: Payer: Self-pay | Admitting: Family Medicine

## 2022-09-20 ENCOUNTER — Telehealth: Payer: Self-pay

## 2022-09-20 DIAGNOSIS — R768 Other specified abnormal immunological findings in serum: Secondary | ICD-10-CM

## 2022-09-20 DIAGNOSIS — F411 Generalized anxiety disorder: Secondary | ICD-10-CM | POA: Diagnosis not present

## 2022-09-20 DIAGNOSIS — F319 Bipolar disorder, unspecified: Secondary | ICD-10-CM | POA: Diagnosis not present

## 2022-09-20 DIAGNOSIS — F5104 Psychophysiologic insomnia: Secondary | ICD-10-CM | POA: Diagnosis not present

## 2022-09-20 DIAGNOSIS — Z79899 Other long term (current) drug therapy: Secondary | ICD-10-CM | POA: Diagnosis not present

## 2022-09-20 NOTE — Telephone Encounter (Signed)
Pt called stating she has more questions

## 2022-09-20 NOTE — Telephone Encounter (Signed)
Pt is aware of Results and the referral for Rheumatology  has been placed

## 2022-09-20 NOTE — Telephone Encounter (Signed)
-----   Message from Sheliah Hatch, MD sent at 09/20/2022  7:29 AM EDT ----- Your ANA is slightly positive.  This is often clinically insignificant, but we will refer you to rheumatology for a complete evaluation (dx positive ANA)

## 2022-09-21 DIAGNOSIS — F411 Generalized anxiety disorder: Secondary | ICD-10-CM | POA: Diagnosis not present

## 2022-09-21 DIAGNOSIS — F3132 Bipolar disorder, current episode depressed, moderate: Secondary | ICD-10-CM | POA: Diagnosis not present

## 2022-09-22 ENCOUNTER — Telehealth: Payer: Self-pay | Admitting: Family Medicine

## 2022-09-22 NOTE — Telephone Encounter (Signed)
I have informed the pt of the ECHO findings and DR Beverely Low message . She expressed verbal concerns

## 2022-09-22 NOTE — Telephone Encounter (Signed)
Caller name: MECHELL GIRGIS  On DPR?: Yes  Call back number: [phone] (home)  Provider they see: Sheliah Hatch, MD  Reason for call:   Pt would like a call about scan done in hospital - wants to rule out heart murmur.

## 2022-09-22 NOTE — Telephone Encounter (Signed)
She had a normal ECHO (ultrasound of the heart).  Everything looks good.  A murmur is just the sound of blood back flowing through a valve.  And all valves on the ECHO looked good.  No cause for concern

## 2022-09-23 ENCOUNTER — Encounter: Payer: Self-pay | Admitting: Diagnostic Neuroimaging

## 2022-09-27 ENCOUNTER — Telehealth: Payer: Self-pay | Admitting: Internal Medicine

## 2022-09-27 DIAGNOSIS — G4734 Idiopathic sleep related nonobstructive alveolar hypoventilation: Secondary | ICD-10-CM

## 2022-09-27 NOTE — Telephone Encounter (Signed)
Patient would like order for portable oxygen concentrator. Patient uses [facility]. ATTN: Intake and stat on the order. Fax number is [phone]. Patient phone number is [phone].

## 2022-09-27 NOTE — Telephone Encounter (Signed)
I spoke with the patient. She is needing an order for travel oxygen sent to [facility]. She will be going out of town and can not take her home concentrator with her.  I have placed the order for travel oxygen. Nothing further needed.

## 2022-09-28 ENCOUNTER — Encounter: Payer: Self-pay | Admitting: Diagnostic Neuroimaging

## 2022-09-28 ENCOUNTER — Telehealth (INDEPENDENT_AMBULATORY_CARE_PROVIDER_SITE_OTHER): Payer: Medicare Other | Admitting: Diagnostic Neuroimaging

## 2022-09-28 DIAGNOSIS — G43109 Migraine with aura, not intractable, without status migrainosus: Secondary | ICD-10-CM | POA: Diagnosis not present

## 2022-09-28 DIAGNOSIS — R200 Anesthesia of skin: Secondary | ICD-10-CM

## 2022-09-28 NOTE — Progress Notes (Signed)
[facility]  PATIENT: Theresa Martin DOB: 08-21-1955  REFERRING CLINICIAN: Sheliah Hatch, MD  HISTORY FROM: patient REASON FOR VISIT: follow up   HISTORICAL  CHIEF COMPLAINT:  Chief Complaint  Patient presents with   Numbness         HISTORY OF PRESENT ILLNESS:   UPDATE (09/28/22, VRP): Since last visit, still with numbness on head, tongue, toes. No fluctuation. No weakness. Still with some stress issues (car issue, glasses). Still with headache issues.  UPDATE (09/14/22, VRP): Since last visit, doing well until 09/10/22 had onset of slurred speech, tongue numbness at pulmonary appt, then went to ER for eval. MRI, CTA negative, except for some fibromuscular dysplasia findings incidentally noted. Could have been complicated migraine.    UPDATE (05/26/22, VRP): Since last visit, doing well. Symptoms are improved. Now doing well with ajovy injections. 1-2 HA per month. Mainly stress associated. Nurtec and rizatriptan working well. Depression still an issue.   UPDATE (05/25/21, VRP): Since last visit, doing well, except recent injection site reaction; used same ajovy needle 3 times (05/20/21), b/c of retained medication in needle. Then had red and swelling around site. Now on steroid and abx, and sxs getting better. Never had this issue before.  UPDATE (11/17/20, VRP): Since last visit, doing well until 3 weeks ago; returned from vacation with family (camper Zenaida Niece; fun but stressful). Then almost daily left sided throbbing HA, left side numbness. Tried gabapentin increase, prednisone, indomethacin; no relief. Phonophobia. No nausea.   UPDATE (11/06/20, MM): 67 year old female with a history of migraine headaches.  She returns today for follow-up.  She reports that she has had an ongoing headache for 2 weeks.  Reports that it starts on the left side behind the eye.  She reports that she has a tingling sensation that radiates down the face and down the arm into the leg  and toes.  She has been taking an over-the-counter medication Tylenol and Advil on a daily basis.  Her sports medicine doctor saw her in the last 2 weeks and did an injection.  She is not sure what the injection was.  His notes is not in epic.  She also reports that she was given a prescription for tramadol.  She states that several years ago she had a severe headache with tingling sensations down the left side of the body and went to the emergency room.  Reports that she had a complete stroke work-up that was negative.  Patient had MRI of the brain with and without contrast in June 2021 that was unremarkable.  She remains on gabapentin 400 mg twice a day.  Reports that this has been controlling her headaches up until the last 2 weeks.  UPDATE (08/08/19, VRP): Since last visit, doing well, except past 3 months, has left eye pulling sensation, left eye pain, then left head pain. Some sensations in right eye. Also with mild glaucoma.   UPDATE (03/21/19, VRP): Since last visit, doing well until COVID pandemic. She weaned herself off her psych meds, then mania, psychosis . Then headaches worsened. Admitted to behavioral health. Now psych issues are better, and headache resolved since last month.   UPDATE (01/30/18, VRP): Since last visit, doing worse with HA (14-15 per month). More stress levels than last visit. Burning sensation in scalp continues.  UPDATE (08/24/17, VRP): Since last visit, doing well. Tolerating propranolol. Avg 4-13 migraine per month. No alleviating or aggravating factors.   Separately, had right thoracic burning pain (no rash) in  fall 2018, and was tx'd empirically with acyclovir. Then with right scalp burning pain last week; also with chronic left scalp sensitivity with migraines.  UPDATE 08/24/16: Since last visit, avg 2-3 HA per month. Tolerating meds. No new issues. Mood stable. Weight improved.   UPDATE 02/23/16: Since last visit has ~ 1-5 HA per month. Tolerating propranolol + OTC  tylenol/aleve for HA mgmt.   UPDATE 08/19/15: Since last visit, avg 1-10 HA per month. Usually with stress. Some more wt gain noted (~10lbs). Also had a a laser eye procedure for angle closure glaucoma, now improved.  UPDATE 04/15/15: Since last visit, doing well. No more migraine since 02/28/15. Propranolol is helping prevent HA. Mood stable.  UPDATE 01/13/15: Since last visit, having more headaches, and more fluid retention. Now being tapered off gabapentin by psychiatry due to side effects.   UPDATE 09/02/14: Doing well. Avg 3-6 days HA per month. Some HA are mild, some are severe. Taking gabapentin 200mg  TID. Works out every day at [employer]. Overall mood is better as well.  UPDATE 05/17/14: Since last visit, patient had some psychiatry issues, was managed by behavioral health, and her headaches significant improved. She was doing well for several years and did not follow-up in our clinic. Patient here with her father today for this visit. Patient's father notes that her headaches seem to be significantly associated with her stress levels. In the last few months her stress levels have increased significantly. Her stress levels related to her ex-husband and her daughters, and some family issues. Patient having intermittent, almost daily left-sided headaches, with numbness and tingling in the left scalp, typically in the evening when she is home. During the daytime patient stays active, works out several times a week, and does better. Patient had been on gabapentin 100 mg at bedtime for several years. Her psychiatrist increased this to 2 and then 3 capsules at bedtime. When she did 3 capsules at bedtime, her headache type symptoms paradoxical he worsened. She then went back to taking one capsule at bedtime. Strangely, she reports that she was told she was able to take this medication (gabapentin) as needed as well and for the last few weeks has been taking one capsule at bedtime, followed by 1-2 capsules every  hour, throughout the night, taking up to 10-14 capsules in a 12 hour period.   PRIOR HPI (11/04/08 - 06/03/09, VRP): 67 year old right-handed female with history of high blood pressure, depression and anxiety presenting for evaluation of chronic headaches and abnormal MRI scan.  On October 26, 2008 the patient presented to the emergency room for right-sided chest pain and was diagnosed with pneumonia.   At this time her headaches worsened in severity.  Upon review of prior MRIs demonstrating microvascular gliosis, patient was referred to our neurology clinic for futher evaluation. Patient's headaches began in 2005 consisting of a "dull pressure like sensation" over the top of her head. Occasionally these are associated with mild nausea without vomiting as well as intermittent left facial numbness.  Patient had dull nagging low level headaches on a daily basis with daily flareups involving a hot sensation over her scalp.  Sometimes this sensation starts as posterior neck pain that moves up the back of her head. She was initially evaluated with MRI scans in 2005 and 2006 and started on topiramate 100 mg at bedtime.   In addition she was taking Advil and Tylenol over-the-counter almost a daily basis.   During flareups the patient would have to lay down  in place ice packs over her eyes and the top of her head.   Patient's headaches are aggravated by lack of sleep or stress. No food triggers noted.  Her other physicians decided to taper her off of NSAIDs, tylenol and also her topiramate. Patient had MRI of the head and neck, found to have some nonspecific white matter lesions. Patient had lumbar puncture and additional blood testing, but multiple sclerosis was ruled out. Patient was treated with several occipital nerve blocks with good results.   REVIEW OF SYSTEMS: Full 14 system review of systems performed and negative except for: as per HPI.   ALLERGIES: No Known Allergies  HOME MEDICATIONS: Outpatient Medications  Prior to Visit  Medication Sig Dispense Refill   albuterol (VENTOLIN HFA) 108 (90 Base) MCG/ACT inhaler TAKE 2 PUFFS BY MOUTH EVERY 6 HOURS AS NEEDED FOR WHEEZE OR SHORTNESS OF BREATH 18 g 2   atorvastatin (LIPITOR) 20 MG tablet TAKE 1 TABLET BY MOUTH EVERY DAY 90 tablet 1   budesonide-formoterol (SYMBICORT) 160-4.5 MCG/ACT inhaler Inhale 2 puffs into the lungs 2 (two) times daily. in the morning and at bedtime. (Patient taking differently: Inhale 1 puff into the lungs daily.) 10.2 each 5   Cholecalciferol (VITAMIN D3 PO) Take 5,000 Units by mouth.     clonazePAM (KLONOPIN) 0.5 MG tablet Take 0.5 mg by mouth daily as needed.     diclofenac Sodium (VOLTAREN) 1 % GEL Apply 1 Application topically 4 (four) times daily as needed.     ELDERBERRY PO Take by mouth.     eletriptan (RELPAX) 40 MG tablet Take 1 tablet (40 mg total) by mouth as needed for migraine or headache. May repeat in 2 hours if headache persists or recurs. 10 tablet 0   escitalopram (LEXAPRO) 10 MG tablet Take 10 mg by mouth daily.     Fremanezumab-vfrm (AJOVY) 225 MG/1.5ML SOAJ Inject 225 mg into the skin every 30 (thirty) days. 4.5 mL 1   latanoprost (XALATAN) 0.005 % ophthalmic solution Place 1 drop into both eyes at bedtime. 2.5 mL 12   linaclotide (LINZESS) 290 MCG CAPS capsule Take 1 capsule (290 mcg total) by mouth daily before breakfast. 30 capsule 5   Misc Natural Products (JOINT HEALTH PO) Take by mouth.     montelukast (SINGULAIR) 10 MG tablet TAKE 1 TABLET BY MOUTH EVERYDAY AT BEDTIME 90 tablet 1   Multiple Vitamins-Minerals (WOMENS MULTIVITAMIN PO) Take by mouth.     pantoprazole (PROTONIX) 40 MG tablet Take 1 tablet (40 mg total) by mouth daily. 30 tablet 5   Probiotic Product (PROBIOTIC PO) Take by mouth.     QUEtiapine (SEROQUEL XR) 400 MG 24 hr tablet Take 1 tablet (400 mg total) by mouth at bedtime. (Patient taking differently: Take 800 mg by mouth at bedtime.) 30 tablet 1   Rimegepant Sulfate (NURTEC) 75 MG TBDP  Take 1 tablet (75 mg total) by mouth as needed (for breakthrough migraine). 8 tablet 12   rizatriptan (MAXALT-MLT) 10 MG disintegrating tablet Take 1 tablet (10 mg total) by mouth as needed for migraine (May repeat in 2 hours if needed). 10 tablet 12   Spacer/Aero-Holding Chambers (AEROCHAMBER PLUS) inhaler Use as instructed 1 each 2   temazepam (RESTORIL) 15 MG capsule Take 1 capsule (15 mg total) by mouth at bedtime. 30 capsule 0   triamterene-hydrochlorothiazide (MAXZIDE-25) 37.5-25 MG tablet TAKE 1 TABLET BY MOUTH EVERY DAY 90 tablet 1   Turmeric (QC TUMERIC COMPLEX) 500 MG CAPS Take by mouth.  UNABLE TO FIND Take 2 tablets by mouth at bedtime. Med Name: magnesium L theronate  650 mg at HS     zinc gluconate 50 MG tablet Take 50 mg by mouth daily.     No facility-administered medications prior to visit.    PAST MEDICAL HISTORY: Past Medical History:  Diagnosis Date   Allergy    Anxiety    Asthma    Depression    Hyperlipidemia    Hypertension    Migraines    Osteopenia after menopause    Persistent headaches    Pneumonia 2020   Postherpetic neuralgia    Renal disorder    Shingles outbreak 02/2014   Skin cancer    moles on right groin and right buttock.   Slurred speech 09/09/2022    PAST SURGICAL HISTORY: Past Surgical History:  Procedure Laterality Date   CARDIOVASCULAR STRESS TEST  02/2009   treadmill stress test: Low risk   CESAREAN SECTION  1987, 1989   COLONOSCOPY     2014/2015 [place], normal   COLONOSCOPY WITH PROPOFOL N/A 10/20/2021   Procedure: COLONOSCOPY WITH PROPOFOL;  Surgeon: Wyline Mood, MD;  Location[facility] ENDOSCOPY;  Service: Gastroenterology;  Laterality: N/A;   COLONOSCOPY WITH PROPOFOL N/A 09/02/2022   Procedure: COLONOSCOPY WITH PROPOFOL;  Surgeon: Toney Reil, MD;  Location: [facility] ENDOSCOPY;  Service: Gastroenterology;  Laterality: N/A;  Requests about 11:15 arrival   DIAGNOSTIC LAPAROSCOPY     ESOPHAGOGASTRODUODENOSCOPY (EGD) WITH  PROPOFOL N/A 09/02/2022   Procedure: ESOPHAGOGASTRODUODENOSCOPY (EGD) WITH PROPOFOL;  Surgeon: Toney Reil, MD;  Location: [facility] ENDOSCOPY;  Service: Gastroenterology;  Laterality: N/A;   EXPLORATORY LAPAROTOMY     laproscopy for infertility   EYE SURGERY Bilateral    laser-correct opening b/tn cornea and iris   MOLE REMOVAL  2017   x 2 moles    FAMILY HISTORY: Family History  Problem Relation Age of Onset   Hypertension Mother    Coronary artery disease Mother    Heart failure Mother    Transient ischemic attack Mother    Hypertension Father    Liver disease Sister        liver failure   Migraines Sister    Arrhythmia Brother    Hypertension Brother    Cancer Neg Hx        colon    Breast cancer Neg Hx     SOCIAL HISTORY:  Social History   Socioeconomic History   Marital status: Legally Separated    Spouse name: Not on file   Number of children: 2   Years of education: Not on file   Highest education level: 12th grade  Occupational History   Occupation: Geologist, engineering   Occupation: retired  Tobacco Use   Smoking status: Never   Smokeless tobacco: Never  Building services engineer Use: Never used  Substance and Sexual Activity   Alcohol use: No    Alcohol/week: 0.0 standard drinks of alcohol   Drug use: No   Sexual activity: Not Currently  Other Topics Concern   Not on file  Social History Narrative   Regular exercise- yes, spins daily   Diet: fruits and veggies, water    No caffeine   Lives alone    Social Determinants of Health   Financial Resource Strain: Low Risk  (07/31/2022)   Overall Financial Resource Strain (CARDIA)    Difficulty of Paying Living Expenses: Not hard at all  Food Insecurity: No Food Insecurity (09/09/2022)   Hunger Vital  Sign    Worried About Programme researcher, broadcasting/film/video in the Last Year: Never true    Ran Out of Food in the Last Year: Never true  Transportation Needs: No Transportation Needs (09/09/2022)   PRAPARE - Therapist, art (Medical): No    Lack of Transportation (Non-Medical): No  Physical Activity: Insufficiently Active (07/31/2022)   Exercise Vital Sign    Days of Exercise per Week: 2 days    Minutes of Exercise per Session: 20 min  Stress: Stress Concern Present (07/31/2022)   [employer] of Occupational Health - Occupational Stress Questionnaire    Feeling of Stress : Rather much  Social Connections: Moderately Integrated (07/31/2022)   Social Connection and Isolation Panel [NHANES]    Frequency of Communication with Friends and Family: More than three times a week    Frequency of Social Gatherings with Friends and Family: Once a week    Attends Religious Services: More than 4 times per year    Active Member of [employer] or Organizations: Yes    Attends Banker Meetings: More than 4 times per year    Marital Status: Separated  Intimate Partner Violence: Not At Risk (09/09/2022)   Humiliation, Afraid, Rape, and Kick questionnaire    Fear of Current or Ex-Partner: No    Emotionally Abused: No    Physically Abused: No    Sexually Abused: No     PHYSICAL EXAM  There were no vitals filed for this visit.  Wt Readings from Last 3 Encounters:  09/16/22 121 lb 6 oz (55.1 kg)  09/14/22 118 lb 6.4 oz (53.7 kg)  09/09/22 120 lb (54.4 kg)   There is no height or weight on file to calculate BMI.  No results found.     06/03/2016    3:23 PM  MMSE - Mini Mental State Exam  Orientation to time 5  Orientation to Place 5  Registration 3  Attention/ Calculation 5  Recall 3  Language- name 2 objects 2  Language- repeat 1  Language- follow 3 step command 3  Language- read & follow direction 1  Write a sentence 1  Copy design 1  Total score 30    GENERAL EXAM: Patient is in no distress; well developed, nourished and groomed; neck is supple  CARDIOVASCULAR: Regular rate and rhythm, no murmurs, no carotid bruits  NEUROLOGIC: MENTAL STATUS: awake, alert, language  fluent, comprehension intact, naming intact, fund of knowledge appropriate CRANIAL NERVE: pupils equal and reactive to light, visual fields full to confrontation, extraocular muscles intact, no nystagmus, facial sensation and strength symmetric, hearing intact, palate elevates symmetrically, uvula midline, shoulder shrug symmetric, tongue midline. MOTOR: normal bulk and tone, full strength in the BUE, BLE SENSORY: normal and symmetric to light touch, temperature, vibration COORDINATION: finger-nose-finger, fine finger movements normal REFLEXES: deep tendon reflexes present and symmetric GAIT/STATION: narrow based gait; romberg is negative    DIAGNOSTIC DATA (LABS, IMAGING, TESTING) - I reviewed patient records, labs, notes, testing and imaging myself where available.  Lab Results  Component Value Date   WBC 6.7 09/09/2022   HGB 12.4 09/09/2022   HCT 37.1 09/09/2022   MCV 92.1 09/09/2022   PLT 240 09/09/2022      Component Value Date/Time   NA 134 (L) 09/09/2022 1300   NA 140 12/10/2020 0954   K 3.6 09/09/2022 1300   CL 98 09/09/2022 1300   CO2 28 09/09/2022 1300   GLUCOSE 114 (H) 09/09/2022 1300  BUN 14 09/09/2022 1300   BUN 18 12/10/2020 0954   CREATININE 0.60 09/09/2022 1300   CREATININE 0.90 06/08/2017 1534   CALCIUM 8.7 (L) 09/09/2022 1300   PROT 7.4 09/09/2022 1300   PROT 6.8 12/10/2020 0954   ALBUMIN 4.4 09/09/2022 1300   ALBUMIN 4.6 12/10/2020 0954   AST 39 09/09/2022 1300   ALT 37 09/09/2022 1300   ALKPHOS 74 09/09/2022 1300   BILITOT 0.5 09/09/2022 1300   BILITOT <0.2 12/10/2020 0954   GFRNONAA >60 09/09/2022 1300   GFRAA >60 02/11/2019 0702   Lab Results  Component Value Date   CHOL 153 09/10/2022   HDL 61 09/10/2022   LDLCALC 79 09/10/2022   TRIG 64 09/10/2022   CHOLHDL 2.5 09/10/2022   Lab Results  Component Value Date   HGBA1C 5.2 09/09/2022   Lab Results  Component Value Date   VITAMINB12 1,257 (H) 01/12/2021   Lab Results  Component Value  Date   TSH 0.82 08/04/2022    11/13/08 LP - opening pressure 7cm H2O; WBC 1, RBC 0, glucose 62, protein 26, OCB (2 in CSF, not seen in serum), IgG index 0.5 (normal), lyme PCR neg, EBV PCR neg  11/13/08 VEP - normal  11/08/08 MRI cervical - normal  11/08/08 MRI brain (with and without contrast) - multiple supratentorial, periventricular and juxtacortical white matter lesions which may represent chronic demyelinating plaques or perivascular gliosis.  No abnormal enhancement on postcontrast views.   08/28/17 MRI brain  1.  Scattered T2/FLAIR hyperintense foci predominantly in the deep and subcortical white matter.  This is a nonspecific finding and most likely represents chronic microvascular ischemic change.  The pattern is not typical for demyelination.  The foci are not acute and they do not enhance after contrast.  When compared to the MRI dated 05/20/2004, there has been only slight progression in the number or size of the foci. 2.  There is a normal enhancement pattern and there are no acute findings.   09/30/18 MRI brain [I reviewed images myself and agree with interpretation. -VRP]  - No acute or subacute infarction. - Numerous scattered foci of T2 and FLAIR signal within the cerebral hemispheric deep and subcortical white matter. The differential diagnosis on the basis of the imaging would be demyelinating disease versus small-vessel disease. Findings appear stable since June of last year but are progressive since 2006. Given the history of hypertension and hyperlipidemia, small-vessel disease is probably more likely  09/21/19 MRI brain - Abnormal MRI scan of the brain showing bilateral nonspecific periventricular and subcortical white matter hyperintensities with a differential discussed above.  No enhancing lesions are noted.  There are mild incidental changes of chronic paranasal sinus inflammation.  Overall no significant change compared with previous MRI from 09/30/2018  12/16/20: This MRI of  the brain with and without contrast shows the following: 1.   Scattered T2/FLAIR hyperintense foci predominantly in the subcortical and deep white matter of both hemispheres.  This is a nonspecific finding but is most consistent with chronic microvascular ischemic change.  Demyelination or vasculitis would be less likely to have this pattern.  None of the foci appear to be acute.  They do not enhance.  Compared to the MRI dated 09/21/2019, there are no new lesions. 2.   Normal enhancement pattern.  No acute findings.  09/09/22 MRI brain No acute intracranial process. No evidence of acute or subacute infarct.  09/09/22 CTA head / neck 1. Beaded appearance of the cervical internal carotid arteries  bilaterally consistent with fibromuscular dysplasia. 2. No significant associated stenosis. 3. Atherosclerotic changes at the left carotid bifurcation without significant stenosis. 4. Normal variant CTA Circle of Willis without significant proximal stenosis, aneurysm, or branch vessel occlusion.    ASSESSMENT AND PLAN  67 y.o. year old female here with mixed tension and migraine headaches, left occipital neuralgia, and longer standing significant depression/anxiety. Some headaches have occipital neuralgia type features. Symptoms seem to be worse with stress and external factors.   Meds tried: gabapentin, propranolol, indomethacin, ibuprofen, tylenol, toradol, rizatriptan, eletriptan, nurtec  Dx:  Migraine with aura and without status migrainosus, not intractable  Numbness    PLAN:  NUMBNESS (scalp, tongue, toes; migraine phenomenon, stress reaction or benign paresthesias) - non-specific; MRI and labs unremarkable; neuro exam was unremarkable at last visit - follow up rheumatology for ANA positive - follow up with psychiatry (anxiety, stress reaction)  MIGRAINE WITH AURA (history of angle closure glaucoma; cannot use topiramate)  MIGRAINE PREVENTION  LIFESTYLE CHANGES -Stop or avoid  smoking -Decrease or avoid caffeine / alcohol -Eat and sleep on a regular schedule -Exercise several times per week - continue fremanezumab (Ajovy) 225mg  monthly  MIGRAINE RESCUE  - ibuprofen, tylenol as needed - eletriptan 40mg  as needed - if not working, then try nurtec 75mg  as needed  MILD SLEEP APNEA - follow up with pulmonary / ENT / PCP (already had home sleep study; now on oxygen)  Schizoaffective schizophrenia / anxiety / depression (per psychiatry) - continue seroquel 800mg  at bedtime  Return in about 6 months (around 03/31/2023).  Virtual Visit via Video Note  I connected with Theresa Martin on 09/28/22 at  4:30 PM EDT by a video enabled telemedicine application and verified that I am speaking with the correct person using two identifiers.   I discussed the limitations of evaluation and management by telemedicine and the availability of in person appointments. The patient expressed understanding and agreed to proceed.  Patient is at home and I am at the office.   I spent 15 minutes of face-to-face and non-face-to-face time with patient.  This included previsit chart review, lab review, study review, order entry, electronic health record documentation, patient education.     Suanne Marker, MD 09/28/2022, 4:25 PM Certified in Neurology, Neurophysiology and Neuroimaging  [facility] [address] [place], [place] [address] [phone]

## 2022-09-29 ENCOUNTER — Telehealth: Payer: Medicare Other | Admitting: Diagnostic Neuroimaging

## 2022-09-29 DIAGNOSIS — M25552 Pain in left hip: Secondary | ICD-10-CM | POA: Diagnosis not present

## 2022-09-29 DIAGNOSIS — M79601 Pain in right arm: Secondary | ICD-10-CM | POA: Diagnosis not present

## 2022-09-29 DIAGNOSIS — M9903 Segmental and somatic dysfunction of lumbar region: Secondary | ICD-10-CM | POA: Diagnosis not present

## 2022-09-29 DIAGNOSIS — M9906 Segmental and somatic dysfunction of lower extremity: Secondary | ICD-10-CM | POA: Diagnosis not present

## 2022-09-29 DIAGNOSIS — M9902 Segmental and somatic dysfunction of thoracic region: Secondary | ICD-10-CM | POA: Diagnosis not present

## 2022-09-29 DIAGNOSIS — M9908 Segmental and somatic dysfunction of rib cage: Secondary | ICD-10-CM | POA: Diagnosis not present

## 2022-10-06 ENCOUNTER — Encounter: Payer: Self-pay | Admitting: Family Medicine

## 2022-10-06 DIAGNOSIS — F331 Major depressive disorder, recurrent, moderate: Secondary | ICD-10-CM

## 2022-10-06 DIAGNOSIS — F259 Schizoaffective disorder, unspecified: Secondary | ICD-10-CM

## 2022-10-08 ENCOUNTER — Other Ambulatory Visit: Payer: Self-pay

## 2022-10-08 DIAGNOSIS — F411 Generalized anxiety disorder: Secondary | ICD-10-CM | POA: Diagnosis not present

## 2022-10-08 DIAGNOSIS — F3132 Bipolar disorder, current episode depressed, moderate: Secondary | ICD-10-CM | POA: Diagnosis not present

## 2022-10-08 DIAGNOSIS — R768 Other specified abnormal immunological findings in serum: Secondary | ICD-10-CM

## 2022-10-10 ENCOUNTER — Encounter: Payer: Self-pay | Admitting: Diagnostic Neuroimaging

## 2022-10-11 ENCOUNTER — Telehealth: Payer: Self-pay | Admitting: Physician Assistant

## 2022-10-11 NOTE — Telephone Encounter (Signed)
Pt called wanting to know when her Mychart message will be replied to. Please advise.

## 2022-10-11 NOTE — Telephone Encounter (Signed)
Called patient to change patient an appointment. The patient's name was on the wait list to get an early appointment. Patient stated she will keep her current appointment.

## 2022-10-11 NOTE — Progress Notes (Unsigned)
Celso Amy, PA-C [address]  [address]  [place], [place] [address]  Main: [phone]  Fax: [phone]   Primary Care Physician: Sheliah Hatch, MD  Primary Gastroenterologist:  Celso Amy, PA-C / Dr. Lannette Donath    CC: Follow-up chronic constipation, GERD, and dysphagia.  HPI: Theresa Martin is a 67 y.o. female returns for 12-month follow-up of chronic constipation, GERD, and dysphagia.  2 months ago she was given samples of Linzess to try for constipation.  She is currently taking Linzess 290 mcg 1 capsule once daily.  She is also taking pantoprazole 40 Mg daily for GERD and erosive gastritis.  Continues to take a lot of ibuprofen for migraine headaches.  She has recently stopped all NSAIDs.  Only takes Tylenol now as needed.  Her dysphagia and epigastric pain have improved.  Heartburn has improved, however she still has occasional episode of breakthrough heartburn.  Worse when she lays down at night.  Currently having bowel movement every other day with occasional hard stool.  Overall constipation has improved.  EGD done 09/02/2022 by Dr. Allegra Lai showed multiple small gastric erosions (erosive gastropathy).  Normal esophagus and duodenum.  Biopsies negative for celiac and H. pylori.  Screening colonoscopy done 09/02/2022 showed no polyps.  No biopsies.  5-year repeat due to family history of her brother who had colon cancer.   Current Outpatient Medications  Medication Sig Dispense Refill   albuterol (VENTOLIN HFA) 108 (90 Base) MCG/ACT inhaler TAKE 2 PUFFS BY MOUTH EVERY 6 HOURS AS NEEDED FOR WHEEZE OR SHORTNESS OF BREATH 18 g 2   atorvastatin (LIPITOR) 20 MG tablet TAKE 1 TABLET BY MOUTH EVERY DAY 90 tablet 1   budesonide-formoterol (SYMBICORT) 160-4.5 MCG/ACT inhaler Inhale 2 puffs into the lungs 2 (two) times daily. in the morning and at bedtime. (Patient taking differently: Inhale 1 puff into the lungs daily.) 10.2 each 5   Cholecalciferol (VITAMIN D3 PO)  Take 5,000 Units by mouth.     clonazePAM (KLONOPIN) 0.5 MG tablet Take 0.5 mg by mouth daily as needed.     diclofenac Sodium (VOLTAREN) 1 % GEL Apply 1 Application topically 4 (four) times daily as needed.     ELDERBERRY PO Take by mouth.     eletriptan (RELPAX) 40 MG tablet Take 1 tablet (40 mg total) by mouth as needed for migraine or headache. May repeat in 2 hours if headache persists or recurs. 10 tablet 0   escitalopram (LEXAPRO) 10 MG tablet Take 10 mg by mouth daily.     Fremanezumab-vfrm (AJOVY) 225 MG/1.5ML SOAJ Inject 225 mg into the skin every 30 (thirty) days. 4.5 mL 1   latanoprost (XALATAN) 0.005 % ophthalmic solution Place 1 drop into both eyes at bedtime. 2.5 mL 12   linaclotide (LINZESS) 290 MCG CAPS capsule Take 1 capsule (290 mcg total) by mouth daily before breakfast. 30 capsule 5   Misc Natural Products (JOINT HEALTH PO) Take by mouth.     montelukast (SINGULAIR) 10 MG tablet TAKE 1 TABLET BY MOUTH EVERYDAY AT BEDTIME 90 tablet 1   Multiple Vitamins-Minerals (WOMENS MULTIVITAMIN PO) Take by mouth.     pantoprazole (PROTONIX) 40 MG tablet Take 1 tablet (40 mg total) by mouth daily. 30 tablet 5   Probiotic Product (PROBIOTIC PO) Take by mouth.     QUEtiapine (SEROQUEL XR) 400 MG 24 hr tablet Take 1 tablet (400 mg total) by mouth at bedtime. (Patient taking differently: Take 800 mg by mouth at bedtime.)  30 tablet 1   Rimegepant Sulfate (NURTEC) 75 MG TBDP Take 1 tablet (75 mg total) by mouth as needed (for breakthrough migraine). 8 tablet 12   rizatriptan (MAXALT-MLT) 10 MG disintegrating tablet Take 1 tablet (10 mg total) by mouth as needed for migraine (May repeat in 2 hours if needed). 10 tablet 12   Spacer/Aero-Holding Chambers (AEROCHAMBER PLUS) inhaler Use as instructed 1 each 2   temazepam (RESTORIL) 15 MG capsule Take 1 capsule (15 mg total) by mouth at bedtime. 30 capsule 0   triamterene-hydrochlorothiazide (MAXZIDE-25) 37.5-25 MG tablet TAKE 1 TABLET BY MOUTH EVERY  DAY 90 tablet 1   Turmeric (QC TUMERIC COMPLEX) 500 MG CAPS Take by mouth.     UNABLE TO FIND Take 2 tablets by mouth at bedtime. Med Name: magnesium L theronate  650 mg at HS     zinc gluconate 50 MG tablet Take 50 mg by mouth daily.     No current facility-administered medications for this visit.    Allergies as of 10/12/2022   (No Known Allergies)    Past Medical History:  Diagnosis Date   Allergy    Anxiety    Asthma    Depression    Hyperlipidemia    Hypertension    Migraines    Osteopenia after menopause    Persistent headaches    Pneumonia 2020   Postherpetic neuralgia    Renal disorder    Shingles outbreak 02/2014   Skin cancer    moles on right groin and right buttock.   Slurred speech 09/09/2022    Past Surgical History:  Procedure Laterality Date   CARDIOVASCULAR STRESS TEST  02/2009   treadmill stress test: Low risk   CESAREAN SECTION  1987, 1989   COLONOSCOPY     2014/2015 [facility], normal   COLONOSCOPY WITH PROPOFOL N/A 10/20/2021   Procedure: COLONOSCOPY WITH PROPOFOL;  Surgeon: Wyline Mood, MD;  Location: [facility] ENDOSCOPY;  Service: Gastroenterology;  Laterality: N/A;   COLONOSCOPY WITH PROPOFOL N/A 09/02/2022   Procedure: COLONOSCOPY WITH PROPOFOL;  Surgeon: Toney Reil, MD;  Location: [facility] ENDOSCOPY;  Service: Gastroenterology;  Laterality: N/A;  Requests about 11:15 arrival   DIAGNOSTIC LAPAROSCOPY     ESOPHAGOGASTRODUODENOSCOPY (EGD) WITH PROPOFOL N/A 09/02/2022   Procedure: ESOPHAGOGASTRODUODENOSCOPY (EGD) WITH PROPOFOL;  Surgeon: Toney Reil, MD;  Location: [facility] ENDOSCOPY;  Service: Gastroenterology;  Laterality: N/A;   EXPLORATORY LAPAROTOMY     laproscopy for infertility   EYE SURGERY Bilateral    laser-correct opening b/tn cornea and iris   MOLE REMOVAL  2017   x 2 moles    Review of Systems:    All systems reviewed and negative except where noted in HPI.   Physical Examination:   There were no vitals taken for this  visit.  General: Well-nourished, well-developed in no acute distress.  Eyes: No icterus. Conjunctivae pink. Mouth: Oropharyngeal mucosa moist and pink , no lesions erythema or exudate. Lungs: Clear to auscultation bilaterally. Non-labored. Heart: Regular rate and rhythm, no murmurs rubs or gallops.  Abdomen: Bowel sounds are normal; Abdomen is Soft; No hepatosplenomegaly, masses or hernias;  No Abdominal Tenderness; No guarding or rebound tenderness. Extremities: No lower extremity edema. No clubbing or deformities. Neuro: Alert and oriented x 3.  Grossly intact. Skin: Warm and dry, no jaundice.   Psych: Alert and cooperative, normal mood and affect.   Imaging Studies: MM 3D SCREENING MAMMOGRAM BILATERAL BREAST  Result Date: 09/20/2022 CLINICAL DATA:  Screening. EXAM: DIGITAL SCREENING BILATERAL MAMMOGRAM  WITH TOMOSYNTHESIS AND CAD TECHNIQUE: Bilateral screening digital craniocaudal and mediolateral oblique mammograms were obtained. Bilateral screening digital breast tomosynthesis was performed. The images were evaluated with computer-aided detection. COMPARISON:  Previous exam(s). ACR Breast Density Category d: The breasts are extremely dense, which lowers the sensitivity of mammography. FINDINGS: There are no findings suspicious for malignancy. IMPRESSION: No mammographic evidence of malignancy. A result letter of this screening mammogram will be mailed directly to the patient. RECOMMENDATION: Screening mammogram in one year. (Code:SM-B-01Y) BI-RADS CATEGORY  1: Negative. Electronically Signed   By: Ted Mcalpine M.D.   On: 09/20/2022 10:21    Assessment and Plan:   Theresa Martin is a 67 y.o. y/o female returns for follow-up.  Recent EGD showed erosive gastropathy.  Biopsies negative for H. pylori and celiac.  Recent colonoscopy was normal.  5-year repeat due to family history of colon cancer in her brother.  1.  Erosive gastritis  Continue pantoprazole 40 Mg daily.  Avoid  NSAIDs.  2.  GERD  GERD diet.    Start OTC famotidine 20 Mg twice daily.  3.  Chronic constipation  Continue Linzess 290 mcg once daily.  Add OTC Colace stool softener 100 mg once daily.  Continue high-fiber diet and 64 ounces of water/fluids daily.  4.  Family history of colon cancer -brother  Repeat colonoscopy in 5 years (08/2027).    Celso Amy, PA-C  Follow up as needed if she has recurrent or worsening GI symptoms.

## 2022-10-12 ENCOUNTER — Ambulatory Visit (INDEPENDENT_AMBULATORY_CARE_PROVIDER_SITE_OTHER): Payer: Medicare Other | Admitting: Physician Assistant

## 2022-10-12 ENCOUNTER — Encounter: Payer: Self-pay | Admitting: Physician Assistant

## 2022-10-12 VITALS — BP 149/63 | HR 97 | Temp 97.8°F | Ht 61.0 in | Wt 121.8 lb

## 2022-10-12 DIAGNOSIS — K5909 Other constipation: Secondary | ICD-10-CM

## 2022-10-12 DIAGNOSIS — Z800 Family history of malignant neoplasm of digestive organs: Secondary | ICD-10-CM | POA: Diagnosis not present

## 2022-10-12 DIAGNOSIS — K219 Gastro-esophageal reflux disease without esophagitis: Secondary | ICD-10-CM

## 2022-10-12 DIAGNOSIS — K2930 Chronic superficial gastritis without bleeding: Secondary | ICD-10-CM | POA: Diagnosis not present

## 2022-10-12 NOTE — Patient Instructions (Signed)
For GERD and Gastritis / stomach ulcers: Add Famotidine 20mg  twice daily. Continue Pantoprazole 40mg  1 tablet once daily. Avoid NSAIDS such as advil, ibuprofen and aleve.  OK to take Tylenol.  For constipation: Continue Linzess 1 tablet once daily. Add OTC Colace stool softener 100mg  1 capsule once daily.

## 2022-10-15 ENCOUNTER — Other Ambulatory Visit: Payer: Self-pay | Admitting: Family Medicine

## 2022-10-20 DIAGNOSIS — M9905 Segmental and somatic dysfunction of pelvic region: Secondary | ICD-10-CM | POA: Diagnosis not present

## 2022-10-20 DIAGNOSIS — R760 Raised antibody titer: Secondary | ICD-10-CM | POA: Diagnosis not present

## 2022-10-20 DIAGNOSIS — M25521 Pain in right elbow: Secondary | ICD-10-CM | POA: Diagnosis not present

## 2022-10-20 DIAGNOSIS — M9906 Segmental and somatic dysfunction of lower extremity: Secondary | ICD-10-CM | POA: Diagnosis not present

## 2022-10-20 DIAGNOSIS — M9904 Segmental and somatic dysfunction of sacral region: Secondary | ICD-10-CM | POA: Diagnosis not present

## 2022-10-20 DIAGNOSIS — M545 Low back pain, unspecified: Secondary | ICD-10-CM | POA: Diagnosis not present

## 2022-10-20 DIAGNOSIS — M9903 Segmental and somatic dysfunction of lumbar region: Secondary | ICD-10-CM | POA: Diagnosis not present

## 2022-10-20 DIAGNOSIS — I773 Arterial fibromuscular dysplasia: Secondary | ICD-10-CM | POA: Diagnosis not present

## 2022-10-20 DIAGNOSIS — M9907 Segmental and somatic dysfunction of upper extremity: Secondary | ICD-10-CM | POA: Diagnosis not present

## 2022-10-20 DIAGNOSIS — M9902 Segmental and somatic dysfunction of thoracic region: Secondary | ICD-10-CM | POA: Diagnosis not present

## 2022-10-21 ENCOUNTER — Telehealth: Payer: Self-pay | Admitting: Family Medicine

## 2022-10-21 NOTE — Telephone Encounter (Signed)
Sandy from [facility] has called saying the referral has been received but they would like office notes sent to [facility] office for the patient.  Fax- [phone]

## 2022-10-21 NOTE — Telephone Encounter (Signed)
I will let Alcario Drought handle this when she returns on Monday

## 2022-10-21 NOTE — Telephone Encounter (Signed)
Record faxed again to the office on 10/21/22

## 2022-10-21 NOTE — Telephone Encounter (Signed)
Sent to Dr.Tabori.

## 2022-10-28 ENCOUNTER — Telehealth: Payer: Self-pay | Admitting: Family Medicine

## 2022-10-28 DIAGNOSIS — R768 Other specified abnormal immunological findings in serum: Secondary | ICD-10-CM

## 2022-10-28 NOTE — Telephone Encounter (Signed)
Placed a new referral for Rheum

## 2022-10-28 NOTE — Telephone Encounter (Signed)
Caller name: DANAYJA VANHOUT  On DPR?: Yes  Call back number: [phone] (home)  Provider they see: Sheliah Hatch, MD  Reason for call:  Pt called stating Rheumatology says level 1.4 wasn't high enough for them. Pt what's to know what you advise next. Advise

## 2022-10-28 NOTE — Telephone Encounter (Signed)
Pt was referred to Rheum due to ANA factor, please advise what she should do next

## 2022-10-28 NOTE — Telephone Encounter (Signed)
Yes please.  Refer rheum, dx positive ANA, in the notes list [facility]

## 2022-10-28 NOTE — Telephone Encounter (Signed)
I spoke to the pt and she states she can go to [facility] . Would you like for me to place referral again and list [facility] ?

## 2022-10-28 NOTE — Telephone Encounter (Signed)
I agree that her ANA was very low, and I can't make rheumatology see her, but we could try to get an appointment at [facility] or [facility] if she wants to try there

## 2022-10-28 NOTE — Telephone Encounter (Signed)
Referral has been sent pt sent a mychart letter

## 2022-11-03 ENCOUNTER — Other Ambulatory Visit: Payer: Self-pay | Admitting: Sports Medicine

## 2022-11-03 DIAGNOSIS — M25511 Pain in right shoulder: Secondary | ICD-10-CM | POA: Diagnosis not present

## 2022-11-03 DIAGNOSIS — M9901 Segmental and somatic dysfunction of cervical region: Secondary | ICD-10-CM | POA: Diagnosis not present

## 2022-11-03 DIAGNOSIS — M545 Low back pain, unspecified: Secondary | ICD-10-CM | POA: Diagnosis not present

## 2022-11-03 DIAGNOSIS — M5414 Radiculopathy, thoracic region: Secondary | ICD-10-CM | POA: Diagnosis not present

## 2022-11-03 DIAGNOSIS — M9902 Segmental and somatic dysfunction of thoracic region: Secondary | ICD-10-CM | POA: Diagnosis not present

## 2022-11-03 DIAGNOSIS — M9907 Segmental and somatic dysfunction of upper extremity: Secondary | ICD-10-CM | POA: Diagnosis not present

## 2022-11-03 DIAGNOSIS — M9905 Segmental and somatic dysfunction of pelvic region: Secondary | ICD-10-CM | POA: Diagnosis not present

## 2022-11-03 DIAGNOSIS — M9908 Segmental and somatic dysfunction of rib cage: Secondary | ICD-10-CM | POA: Diagnosis not present

## 2022-11-03 DIAGNOSIS — M9904 Segmental and somatic dysfunction of sacral region: Secondary | ICD-10-CM | POA: Diagnosis not present

## 2022-11-03 DIAGNOSIS — M792 Neuralgia and neuritis, unspecified: Secondary | ICD-10-CM | POA: Diagnosis not present

## 2022-11-03 DIAGNOSIS — M542 Cervicalgia: Secondary | ICD-10-CM | POA: Diagnosis not present

## 2022-11-03 DIAGNOSIS — M9903 Segmental and somatic dysfunction of lumbar region: Secondary | ICD-10-CM | POA: Diagnosis not present

## 2022-11-04 DIAGNOSIS — F411 Generalized anxiety disorder: Secondary | ICD-10-CM | POA: Diagnosis not present

## 2022-11-04 DIAGNOSIS — F3132 Bipolar disorder, current episode depressed, moderate: Secondary | ICD-10-CM | POA: Diagnosis not present

## 2022-11-10 ENCOUNTER — Encounter: Payer: Self-pay | Admitting: Internal Medicine

## 2022-11-10 ENCOUNTER — Encounter: Payer: Self-pay | Admitting: Family Medicine

## 2022-11-10 ENCOUNTER — Ambulatory Visit: Payer: Medicare Other | Admitting: Internal Medicine

## 2022-11-10 ENCOUNTER — Telehealth: Payer: Self-pay | Admitting: Internal Medicine

## 2022-11-10 VITALS — BP 126/70 | HR 79 | Temp 97.8°F | Ht 61.0 in | Wt 120.2 lb

## 2022-11-10 DIAGNOSIS — G4733 Obstructive sleep apnea (adult) (pediatric): Secondary | ICD-10-CM | POA: Diagnosis not present

## 2022-11-10 DIAGNOSIS — J4520 Mild intermittent asthma, uncomplicated: Secondary | ICD-10-CM | POA: Diagnosis not present

## 2022-11-10 DIAGNOSIS — R918 Other nonspecific abnormal finding of lung field: Secondary | ICD-10-CM | POA: Diagnosis not present

## 2022-11-10 NOTE — Telephone Encounter (Signed)
Pt. Sched. With [facility] but really want to get on [facility] sched. Please advise when something comes available

## 2022-11-10 NOTE — Patient Instructions (Addendum)
Recommend stopping Symbicort inhaler and assessing your breathing Avoid secondhand smoke Avoid SICK contacts Recommend  Masking  when appropriate Recommend Keep up-to-date with vaccinations  Recommend reassessment with home sleep test and overnight oxygen testing  Recommend CT chest for interval changes

## 2022-11-10 NOTE — Progress Notes (Signed)
Name: Theresa Martin MRN: [id number] DOB: May 05, 1955       TEST/EVENTS :  CT chest 10/2018 showed chronic changes in RUL felt from previous PNA  And multiple 3 mm nodules.   CT chest November 2021 negative for PE, stable chronic scarring in the lung apices, chronic airspace opacity in the right upper lobe, opacity in the left lower lobe, stable to millimeter lung nodules, no adenopathy, small hiatal hernia  CBC - Eosinophils 11/2020 -ab eos (100)   Triggers include smoke perfumes colognes  CT of the chest reviewed 2021 RML opacity, small lung nodules   Will need to consider repeat CT chest for follow up assessment of nodules and opacity Patient with previous history of right lower lobe pneumonia and history of lung nodules subcentimeter CT scan reviewed over the last 10 years Will need to repeat CT chest and compared to 2021   CHIEF COMPLAINT Follow up ASTHMA Follow up abnormal CT chest   HISTORY OF PRESENT ILLNESS: Follow-up assessment for asthma Well-controlled No exacerbation Plan to wean off Symbicort Use albuterol as needed  No exacerbation at this time No evidence of heart failure at this time No evidence or signs of infection at this time No respiratory distress No fevers, chills, nausea, vomiting, diarrhea No evidence of lower extremity edema No evidence hemoptysis  Patient was sent to ER last visit Patient diagnosed with complicated migraine Positive ANA Beaded internal and cerebral carotid arteries Rheumatology referral pending  Patient with a previous history of abnormal HST with AHI of 9 Patient was placed on oxygen therapy  Recommend Sleep Study   Encouraged proper weight management.  Important to get eight or more hours of sleep  Limiting the use of the computer and television before bedtime.  Decrease naps during the day, so night time sleep will become enhanced.  Limit caffeine, and sleep deprivation.  HTN, stroke, uncontrolled  diabetes and heart failure are potential risk factors.  Risk of untreated sleep apnea including cardiac arrhthymias, stroke, DM, pulm HTN.   EPWORTH SLEEP SCORE 8 PREVIOUS HST SHOWS AHI 9 per PATIENT REPORT  Patient uses oxygen therapy at nighttime however I do not see overnight pulse oximetry at this time  PAST MEDICAL HISTORY :   has a past medical history of Allergy, Anxiety, Asthma, Depression, Hyperlipidemia, Hypertension, Migraines, Osteopenia after menopause, Persistent headaches, Pneumonia (2020), Postherpetic neuralgia, Renal disorder, Shingles outbreak (02/2014), Skin cancer, and Slurred speech (09/09/2022).  has a past surgical history that includes Cesarean section (1987, 1989); Exploratory laparotomy; Cardiovascular stress test (02/2009); Mole removal (2017); Eye surgery (Bilateral); Colonoscopy; Diagnostic laparoscopy; Colonoscopy with propofol (N/A, 10/20/2021); Colonoscopy with propofol (N/A, 09/02/2022); and Esophagogastroduodenoscopy (egd) with propofol (N/A, 09/02/2022). Prior to Admission medications   Medication Sig Start Date End Date Taking? Authorizing Provider  albuterol (VENTOLIN HFA) 108 (90 Base) MCG/ACT inhaler Inhale 2 puffs into the lungs every 6 (six) hours as needed for wheezing or shortness of breath. 08/10/18   Sheliah Hatch, MD  atorvastatin (LIPITOR) 20 MG tablet TAKE 1 TABLET BY MOUTH EVERY DAY 04/10/18   Sheliah Hatch, MD  beclomethasone (QVAR) 80 MCG/ACT inhaler Inhale 1-2 puffs into the lungs 2 (two) times daily. 09/27/18   Waldon Merl, PA-C  Calcium-Vitamin D-Vitamin K (CALCIUM + D + K PO) Take 3 each by mouth daily. Calcium 500mg - Vitamin D 1000iu- K 40mg      [provider]  clonazePAM (KLONOPIN) 0.5 MG tablet Take 0.25 mg by mouth at bedtime as needed.  01/12/14   [provider]  ELDERBERRY PO Take by mouth.    [provider]  escitalopram (LEXAPRO) 10 MG tablet Take 10 mg by mouth daily.     [provider]  gabapentin (NEURONTIN) 100 MG capsule Take 1-3 capsules (100-300 mg total) by mouth 2 (two) times daily. Patient taking differently: Take 100-300 mg by mouth 2 (two) times daily. 100 in the morning and 200 at night. 01/30/18   Penumalli, Glenford Bayley, MD  Glucosamine-Chondroitin (MOVE FREE PO) Take 1 tablet by mouth daily.    [provider]  Ketoconazole-Hydrocortisone 2 & 1 % KIT Apply topically as needed. Cracks on mouth    [provider]  lamoTRIgine (LAMICTAL) 100 MG tablet TAKE 100mg  in the morning and 100 mg by mouth before bed.    [provider]  Melatonin 3 MG CAPS Take 1 capsule by mouth at bedtime.    [provider]  montelukast (SINGULAIR) 10 MG tablet Take 1 tablet (10 mg total) by mouth at bedtime. 09/27/18   Waldon Merl, PA-C  Naproxen Sodium (ALEVE) 220 MG CAPS As needed for headache 04/15/15   Penumalli, Glenford Bayley, MD  NEOMYCIN-POLYMYXIN-HYDROCORTISONE (CORTISPORIN) 1 % SOLN OTIC solution Apply 1-2 drops to toe bid after soaking 04/03/18   Hyatt, Max T, DPM  traZODone (DESYREL) 50 MG tablet Take 50 mg by mouth at bedtime as needed for sleep.  05/29/15   [provider]  triamterene-hydrochlorothiazide (MAXZIDE-25) 37.5-25 MG tablet TAKE 1 TABLET BY MOUTH EVERY DAY 10/04/18   Sheliah Hatch, MD   No Known Allergies  FAMILY HISTORY:  family history includes Arrhythmia in her brother; Coronary artery disease in her mother; Heart failure in her mother; Hypertension in her brother, father, and mother; Liver disease in her sister; Migraines in her sister; Transient ischemic attack in her mother. SOCIAL HISTORY:  reports that she has never smoked. She has never used smokeless tobacco. She reports that she does not drink alcohol and does not use drugs.  BP 126/70 (BP Location: Left Arm, Cuff Size: Normal)   Pulse 79   Temp 97.8 F (36.6 C) (Temporal)   Ht 5\' 1"  (1.549 m)   Wt 120 lb 3.2 oz (54.5 kg)   SpO2 98%   BMI 22.71 kg/m        Review of Systems: Gen:  Denies  fever, sweats, chills weight loss  HEENT: Denies blurred vision, double vision, ear pain, eye pain, hearing loss, nose bleeds, sore throat Cardiac:  No dizziness, chest pain or heaviness, chest tightness,edema, No JVD Resp:   No cough, -sputum production, -shortness of breath,-wheezing, -hemoptysis,  Other:  All other systems negative   Physical Examination:   General Appearance: No distress  EYES PERRLA, EOM intact.   NECK Supple, No JVD Pulmonary: normal breath sounds, No wheezing.  CardiovascularNormal S1,S2.  No m/r/g.   Abdomen: Benign, Soft, non-tender. Neurology UE/LE 5/5 strength, no focal deficits Ext pulses intact, cap refill intact ALL OTHER ROS ARE NEGATIVE     IMAGING   CT chest 10/2018  No acute process, 3 mm nodules No further CT scans needed   CT  was Independently Reviewed By Me Today       ASSESSMENT AND PLAN SYNOPSIS 67 year old pleasant white female seen today for follow-up assessment for asthma and abnormal CT chest with right middle lobe opacity and multiple subcentimeter lung nodules with a previous history of oxygen therapy and mild sleep apnea with AHI 0.9  Asthma  well-controlled No indication for antibiotics or prednisone at this time Wean off Symbicort as tolerated Albuterol as needed Avoid secondhand smoke Avoid SICK contacts Recommend  Masking  when appropriate Recommend Keep up-to-date with vaccinations  Previous diagnosis of sleep apnea Recommend HST with overnight pulse oximetry  History of abnormal CT chest with pneumonia Recommend repeat CT chest  Instructions to patient Recommend stopping Symbicort inhaler and assessing your breathing Avoid secondhand smoke Avoid SICK contacts Recommend  Masking  when appropriate Recommend Keep up-to-date with vaccinations Recommend reassessment with home sleep test and overnight oxygen testing Recommend CT chest for interval  changes  CURRENT MEDICATIONS REVIEWED AT LENGTH WITH PATIENT TODAY    Total time spent 32 minutes   Santiago Glad, M.D.  [facility]  Medical Director [facility] Medical Director [facility] Cardio-Pulmonary Department

## 2022-11-10 NOTE — Telephone Encounter (Signed)
Will hold message in triage to schedule with Dr. Belia Heman if slots come available.

## 2022-11-12 ENCOUNTER — Encounter: Payer: Self-pay | Admitting: Internal Medicine

## 2022-11-15 ENCOUNTER — Telehealth: Payer: Self-pay | Admitting: Internal Medicine

## 2022-11-15 ENCOUNTER — Ambulatory Visit: Payer: Medicare Other

## 2022-11-15 NOTE — Telephone Encounter (Signed)
Patient is aware of recommendations and voiced her understanding. Nothing further needed.   

## 2022-11-15 NOTE — Telephone Encounter (Signed)
Pt. Asking about temazepam (RESTORIL) 15 MG capsule  if she should take this for sleep while doing her HST and if this med would cause her to stop breathing at night please advise

## 2022-11-15 NOTE — Telephone Encounter (Signed)
Dr. Kasa, please advise. Thanks °

## 2022-11-15 NOTE — Telephone Encounter (Signed)
Called and spoke to patient.  She is questioning if she should take Restoril the night of her sleep study. She is also questioning if this medication could be causing apnea events?  Dr. Belia Heman, please advise. Thanks

## 2022-11-16 ENCOUNTER — Telehealth: Payer: Self-pay | Admitting: Internal Medicine

## 2022-11-16 NOTE — Telephone Encounter (Signed)
Apt. Was made for pt.

## 2022-11-16 NOTE — Telephone Encounter (Signed)
Patient is calling about SNAP diagnostic box. It has the oxygen and the finger monitor but she doesn't have the watch attachment. Please call and advise.

## 2022-11-17 ENCOUNTER — Ambulatory Visit
Admission: RE | Admit: 2022-11-17 | Discharge: 2022-11-17 | Disposition: A | Discharge status: Home / Self Care | Payer: Medicare Other | Source: Ambulatory Visit | Attending: Internal Medicine | Admitting: Internal Medicine

## 2022-11-17 DIAGNOSIS — J479 Bronchiectasis, uncomplicated: Secondary | ICD-10-CM | POA: Diagnosis not present

## 2022-11-17 DIAGNOSIS — J439 Emphysema, unspecified: Secondary | ICD-10-CM | POA: Diagnosis not present

## 2022-11-17 DIAGNOSIS — R918 Other nonspecific abnormal finding of lung field: Secondary | ICD-10-CM | POA: Insufficient documentation

## 2022-11-17 NOTE — Telephone Encounter (Signed)
Dr. Belia Heman has cancellation 10/23. Called patient back and offered that appt.  Appt scheduled.  Nothing further needed.

## 2022-11-17 NOTE — Telephone Encounter (Signed)
Patient seen Dr. Belia Heman 11/10/2022 and instructed to follow up in 51mo. Pending appt 11/26/2022, however test have not been completed.  Spoke to patient. She stated that she would like to reschedule appt until test have been completed. She stated that she received a call from our office yesterday to schedule. It appears that patient was on wait list. Patient is aware that I will contact her once Dr. Clovis Fredrickson Dec schedule is available.

## 2022-11-18 ENCOUNTER — Ambulatory Visit: Payer: Medicare Other

## 2022-11-18 DIAGNOSIS — M1990 Unspecified osteoarthritis, unspecified site: Secondary | ICD-10-CM | POA: Diagnosis not present

## 2022-11-18 DIAGNOSIS — M25561 Pain in right knee: Secondary | ICD-10-CM | POA: Diagnosis not present

## 2022-11-18 DIAGNOSIS — H6123 Impacted cerumen, bilateral: Secondary | ICD-10-CM | POA: Diagnosis not present

## 2022-11-18 DIAGNOSIS — M79643 Pain in unspecified hand: Secondary | ICD-10-CM | POA: Diagnosis not present

## 2022-11-18 DIAGNOSIS — M549 Dorsalgia, unspecified: Secondary | ICD-10-CM | POA: Diagnosis not present

## 2022-11-18 DIAGNOSIS — M25569 Pain in unspecified knee: Secondary | ICD-10-CM | POA: Diagnosis not present

## 2022-11-18 DIAGNOSIS — M25562 Pain in left knee: Secondary | ICD-10-CM | POA: Diagnosis not present

## 2022-11-18 DIAGNOSIS — M79642 Pain in left hand: Secondary | ICD-10-CM | POA: Diagnosis not present

## 2022-11-18 DIAGNOSIS — M25521 Pain in right elbow: Secondary | ICD-10-CM | POA: Diagnosis not present

## 2022-11-18 DIAGNOSIS — H04129 Dry eye syndrome of unspecified lacrimal gland: Secondary | ICD-10-CM | POA: Diagnosis not present

## 2022-11-18 DIAGNOSIS — M2550 Pain in unspecified joint: Secondary | ICD-10-CM | POA: Diagnosis not present

## 2022-11-18 DIAGNOSIS — H900 Conductive hearing loss, bilateral: Secondary | ICD-10-CM | POA: Diagnosis not present

## 2022-11-18 DIAGNOSIS — R768 Other specified abnormal immunological findings in serum: Secondary | ICD-10-CM | POA: Diagnosis not present

## 2022-11-18 DIAGNOSIS — M79641 Pain in right hand: Secondary | ICD-10-CM | POA: Diagnosis not present

## 2022-11-20 DIAGNOSIS — G4733 Obstructive sleep apnea (adult) (pediatric): Secondary | ICD-10-CM | POA: Diagnosis not present

## 2022-11-22 DIAGNOSIS — J4520 Mild intermittent asthma, uncomplicated: Secondary | ICD-10-CM | POA: Diagnosis not present

## 2022-11-22 NOTE — Telephone Encounter (Signed)
Synetta Fail, can you assist with this? I'm not very familiar with the SNAP device.

## 2022-11-24 DIAGNOSIS — F319 Bipolar disorder, unspecified: Secondary | ICD-10-CM | POA: Diagnosis not present

## 2022-11-24 DIAGNOSIS — F3132 Bipolar disorder, current episode depressed, moderate: Secondary | ICD-10-CM | POA: Diagnosis not present

## 2022-11-24 DIAGNOSIS — F5104 Psychophysiologic insomnia: Secondary | ICD-10-CM | POA: Diagnosis not present

## 2022-11-24 DIAGNOSIS — F411 Generalized anxiety disorder: Secondary | ICD-10-CM | POA: Diagnosis not present

## 2022-11-26 ENCOUNTER — Ambulatory Visit: Payer: Medicare Other | Admitting: Internal Medicine

## 2022-11-26 ENCOUNTER — Telehealth: Payer: Self-pay

## 2022-11-26 DIAGNOSIS — G4734 Idiopathic sleep related nonobstructive alveolar hypoventilation: Secondary | ICD-10-CM

## 2022-11-26 NOTE — Telephone Encounter (Signed)
ONO reviewed by Dr. Belia Heman- no oxygen needed.   Patient is aware of results and voiced her understanding. She stated that she currently wears oxygen at bedtime. Can this be d/c?  Dr. Belia Heman, please advise. Thanks

## 2022-11-30 NOTE — Telephone Encounter (Signed)
Patient checking on message for oxygen. Patient phone number is [phone].

## 2022-11-30 NOTE — Telephone Encounter (Signed)
Patient is aware that we are awaiting a response form Dr. Belia Heman regarding the oxygen. She would also like CT results.  Dr. Belia Heman, please advise. Thanks

## 2022-12-02 DIAGNOSIS — M5414 Radiculopathy, thoracic region: Secondary | ICD-10-CM | POA: Diagnosis not present

## 2022-12-02 DIAGNOSIS — M25551 Pain in right hip: Secondary | ICD-10-CM | POA: Diagnosis not present

## 2022-12-02 DIAGNOSIS — M436 Torticollis: Secondary | ICD-10-CM | POA: Diagnosis not present

## 2022-12-02 DIAGNOSIS — M542 Cervicalgia: Secondary | ICD-10-CM | POA: Diagnosis not present

## 2022-12-02 DIAGNOSIS — M9903 Segmental and somatic dysfunction of lumbar region: Secondary | ICD-10-CM | POA: Diagnosis not present

## 2022-12-02 DIAGNOSIS — M9904 Segmental and somatic dysfunction of sacral region: Secondary | ICD-10-CM | POA: Diagnosis not present

## 2022-12-02 DIAGNOSIS — M9902 Segmental and somatic dysfunction of thoracic region: Secondary | ICD-10-CM | POA: Diagnosis not present

## 2022-12-02 DIAGNOSIS — M9901 Segmental and somatic dysfunction of cervical region: Secondary | ICD-10-CM | POA: Diagnosis not present

## 2022-12-02 DIAGNOSIS — M9905 Segmental and somatic dysfunction of pelvic region: Secondary | ICD-10-CM | POA: Diagnosis not present

## 2022-12-03 ENCOUNTER — Encounter: Payer: Self-pay | Admitting: Internal Medicine

## 2022-12-03 NOTE — Telephone Encounter (Signed)
Patient is aware of results and voiced her understanding.  Nothing further needed.   

## 2022-12-03 NOTE — Telephone Encounter (Signed)
Dr. Belia Heman, please advise if okay to D/C oxygen?

## 2022-12-03 NOTE — Telephone Encounter (Signed)
Spoke to patient and verified that she did NOT wear oxygen the night of the ONO.  Order placed to adapt to d/c oxygen.  Nothing further needed.

## 2022-12-03 NOTE — Addendum Note (Signed)
Addended by: Lajoyce Lauber A on: 12/03/2022 02:10 PM   Modules accepted: Orders

## 2022-12-06 DIAGNOSIS — F411 Generalized anxiety disorder: Secondary | ICD-10-CM | POA: Diagnosis not present

## 2022-12-06 DIAGNOSIS — F3132 Bipolar disorder, current episode depressed, moderate: Secondary | ICD-10-CM | POA: Diagnosis not present

## 2022-12-13 ENCOUNTER — Encounter (INDEPENDENT_AMBULATORY_CARE_PROVIDER_SITE_OTHER): Payer: Medicare Other

## 2022-12-13 DIAGNOSIS — G4733 Obstructive sleep apnea (adult) (pediatric): Secondary | ICD-10-CM | POA: Diagnosis not present

## 2022-12-24 DIAGNOSIS — F411 Generalized anxiety disorder: Secondary | ICD-10-CM | POA: Diagnosis not present

## 2022-12-24 DIAGNOSIS — F3132 Bipolar disorder, current episode depressed, moderate: Secondary | ICD-10-CM | POA: Diagnosis not present

## 2022-12-27 ENCOUNTER — Encounter: Payer: Self-pay | Admitting: Internal Medicine

## 2022-12-28 NOTE — Telephone Encounter (Signed)
Patient completed sleep study on 11/20/22

## 2023-01-11 ENCOUNTER — Encounter: Payer: Self-pay | Admitting: Family Medicine

## 2023-01-11 NOTE — Telephone Encounter (Signed)
Patient requesting recommendation for psychiatrist please advise if you have anyone to recommend

## 2023-01-12 DIAGNOSIS — F319 Bipolar disorder, unspecified: Secondary | ICD-10-CM | POA: Diagnosis not present

## 2023-01-12 DIAGNOSIS — F5104 Psychophysiologic insomnia: Secondary | ICD-10-CM | POA: Diagnosis not present

## 2023-01-12 DIAGNOSIS — F411 Generalized anxiety disorder: Secondary | ICD-10-CM | POA: Diagnosis not present

## 2023-01-17 ENCOUNTER — Telehealth: Payer: Self-pay

## 2023-01-17 ENCOUNTER — Telehealth: Payer: Self-pay | Admitting: Physician Assistant

## 2023-01-17 NOTE — Telephone Encounter (Signed)
Spoke with patient.   Yes, okay to try OTC treatment for constipation.  Options include:  1.  MiraLAX powder, mix 1 or 2 capfuls in a drink once daily.  2.  Senokot 8.6 Mg 2 tablets once or twice daily.  3.  Colace stool softener 100 mg 1 capsule once daily.  She can take 1 or all of these treatments if needed.  Celso Amy, PA-C

## 2023-01-17 NOTE — Telephone Encounter (Signed)
Patient called in and left a message about her (Linzess) 290 MG the price has double and she wanted to see if she can change the medication or buy something over the counter. I called patient back to let her know that we received her voicemail. I transfer her to the nurse.

## 2023-01-17 NOTE — Telephone Encounter (Signed)
Patient left VM today-Linzess has increased in price and patient wanted to know if there is something OTC she can take.

## 2023-01-19 ENCOUNTER — Ambulatory Visit: Payer: Medicare Other | Admitting: Internal Medicine

## 2023-01-19 DIAGNOSIS — F411 Generalized anxiety disorder: Secondary | ICD-10-CM | POA: Diagnosis not present

## 2023-01-19 DIAGNOSIS — F3132 Bipolar disorder, current episode depressed, moderate: Secondary | ICD-10-CM | POA: Diagnosis not present

## 2023-01-20 ENCOUNTER — Ambulatory Visit: Payer: Medicare Other | Admitting: Adult Health

## 2023-02-04 ENCOUNTER — Ambulatory Visit: Payer: Medicare Other | Admitting: Family Medicine

## 2023-02-09 DIAGNOSIS — H401132 Primary open-angle glaucoma, bilateral, moderate stage: Secondary | ICD-10-CM | POA: Diagnosis not present

## 2023-02-11 ENCOUNTER — Encounter: Payer: Self-pay | Admitting: Internal Medicine

## 2023-02-11 ENCOUNTER — Ambulatory Visit (INDEPENDENT_AMBULATORY_CARE_PROVIDER_SITE_OTHER): Payer: Medicare Other | Admitting: Internal Medicine

## 2023-02-11 ENCOUNTER — Ambulatory Visit: Payer: Medicare Other | Admitting: Adult Health

## 2023-02-11 ENCOUNTER — Ambulatory Visit: Payer: Medicare Other | Admitting: Internal Medicine

## 2023-02-11 VITALS — BP 122/72 | HR 96 | Temp 97.1°F | Ht 61.0 in | Wt 124.2 lb

## 2023-02-11 DIAGNOSIS — J4520 Mild intermittent asthma, uncomplicated: Secondary | ICD-10-CM

## 2023-02-11 NOTE — Patient Instructions (Signed)
Continue to use your inhalers as needed Can stop Singulair at this time  Avoid secondhand smoke Avoid SICK contacts Recommend  Masking  when appropriate Recommend Keep up-to-date with vaccinations

## 2023-02-11 NOTE — Progress Notes (Signed)
Name: Theresa Martin MRN: [id number] DOB: 1955/04/30       TEST/EVENTS :  CT chest 10/2018 showed chronic changes in RUL felt from previous PNA  And multiple 3 mm nodules.   CT chest November 2021 negative for PE, stable chronic scarring in the lung apices, chronic airspace opacity in the right upper lobe, opacity in the left lower lobe, stable to millimeter lung nodules, no adenopathy, small hiatal hernia  CBC - Eosinophils 11/2020 -ab eos (100)   Triggers include smoke perfumes colognes  CT of the chest reviewed 2021 RML opacity, small lung nodules   Will need to consider repeat CT chest for follow up assessment of nodules and opacity Patient with previous history of right lower lobe pneumonia and history of lung nodules subcentimeter CT scan reviewed over the last 10 years Will need to repeat CT chest and compared to 2021  CT chest 10/2022 No significant nodule seen compared to 2021  HST 10/2022 No evidence of sleep apnea ONO no evidence of hypoxia  CHIEF COMPLAINT Follow-up assessment for asthma Follow-up assessment for abnormal CT chest with lung nodules     HISTORY OF PRESENT ILLNESS: Assessment of asthma Well-controlled No exacerbation Previous plan was to wean off Symbicort Patient doing well at this time with Symbicort as needed Use albuterol as needed  No exacerbation at this time No evidence of heart failure at this time No evidence or signs of infection at this time No respiratory distress No fevers, chills, nausea, vomiting, diarrhea No evidence of lower extremity edema No evidence hemoptysis  Last visit patient was sent to ER Diagnosed with complicated migraine Positive ANA Beaded internal and cerebral carotid arteries Rheumatology referral pending  Patient with a previous history of abnormal HST with AHI of 9 Patient was placed on oxygen therapy  Most recent HST August 2024 reviewed in detail with patient AHI 1.9 Patient diagnosed  with clinically significant hypoxia Continue oxygen as prescribed  CT chest August 2024 Findings reviewed with patient in detail Independently reviewed by me today 02/11/2023  No significant change in a small number of small bilateral lung nodules, the largest measuring 3 mm in maximum diameter. These do not need imaging follow-up unless otherwise clinically indicated.   PAST MEDICAL HISTORY :   has a past medical history of Allergy, Anxiety, Asthma, Depression, Hyperlipidemia, Hypertension, Migraines, Osteopenia after menopause, Persistent headaches, Pneumonia (2020), Postherpetic neuralgia, Renal disorder, Shingles outbreak (02/2014), Skin cancer, and Slurred speech (09/09/2022).  has a past surgical history that includes Cesarean section (1987, 1989); Exploratory laparotomy; Cardiovascular stress test (02/2009); Mole removal (2017); Eye surgery (Bilateral); Colonoscopy; Diagnostic laparoscopy; Colonoscopy with propofol (N/A, 10/20/2021); Colonoscopy with propofol (N/A, 09/02/2022); and Esophagogastroduodenoscopy (egd) with propofol (N/A, 09/02/2022). Prior to Admission medications   Medication Sig Start Date End Date Taking? Authorizing Provider  albuterol (VENTOLIN HFA) 108 (90 Base) MCG/ACT inhaler Inhale 2 puffs into the lungs every 6 (six) hours as needed for wheezing or shortness of breath. 08/10/18   Sheliah Hatch, MD  atorvastatin (LIPITOR) 20 MG tablet TAKE 1 TABLET BY MOUTH EVERY DAY 04/10/18   Sheliah Hatch, MD  beclomethasone (QVAR) 80 MCG/ACT inhaler Inhale 1-2 puffs into the lungs 2 (two) times daily. 09/27/18   Waldon Merl, PA-C  Calcium-Vitamin D-Vitamin K (CALCIUM + D + K PO) Take 3 each by mouth daily. Calcium 500mg - Vitamin D 1000iu- K 40mg      [provider]  clonazePAM (KLONOPIN) 0.5 MG tablet Take 0.25 mg by  mouth at bedtime as needed.  01/12/14   [provider]  ELDERBERRY PO Take by mouth.    [provider]  escitalopram (LEXAPRO)  10 MG tablet Take 10 mg by mouth daily.     [provider]  gabapentin (NEURONTIN) 100 MG capsule Take 1-3 capsules (100-300 mg total) by mouth 2 (two) times daily. Patient taking differently: Take 100-300 mg by mouth 2 (two) times daily. 100 in the morning and 200 at night. 01/30/18   Penumalli, Glenford Bayley, MD  Glucosamine-Chondroitin (MOVE FREE PO) Take 1 tablet by mouth daily.    [provider]  Ketoconazole-Hydrocortisone 2 & 1 % KIT Apply topically as needed. Cracks on mouth    [provider]  lamoTRIgine (LAMICTAL) 100 MG tablet TAKE 100mg  in the morning and 100 mg by mouth before bed.    [provider]  Melatonin 3 MG CAPS Take 1 capsule by mouth at bedtime.    [provider]  montelukast (SINGULAIR) 10 MG tablet Take 1 tablet (10 mg total) by mouth at bedtime. 09/27/18   Waldon Merl, PA-C  Naproxen Sodium (ALEVE) 220 MG CAPS As needed for headache 04/15/15   Penumalli, Glenford Bayley, MD  NEOMYCIN-POLYMYXIN-HYDROCORTISONE (CORTISPORIN) 1 % SOLN OTIC solution Apply 1-2 drops to toe bid after soaking 04/03/18   Hyatt, Max T, DPM  traZODone (DESYREL) 50 MG tablet Take 50 mg by mouth at bedtime as needed for sleep.  05/29/15   [provider]  triamterene-hydrochlorothiazide (MAXZIDE-25) 37.5-25 MG tablet TAKE 1 TABLET BY MOUTH EVERY DAY 10/04/18   Sheliah Hatch, MD   No Known Allergies  FAMILY HISTORY:  family history includes Arrhythmia in her brother; Coronary artery disease in her mother; Heart failure in her mother; Hypertension in her brother, father, and mother; Liver disease in her sister; Migraines in her sister; Transient ischemic attack in her mother. SOCIAL HISTORY:  reports that she has never smoked. She has never used smokeless tobacco. She reports that she does not drink alcohol and does not use drugs.    BP 122/72 (BP Location: Right Arm, Cuff Size: Normal)   Pulse 96   Temp (!) 97.1 F (36.2 C)   Ht 5\' 1"  (1.549 m)    Wt 124 lb 3.2 oz (56.3 kg)   SpO2 96%   BMI 23.47 kg/m     Review of Systems: Gen:  Denies  fever, sweats, chills weight loss  HEENT: Denies blurred vision, double vision, ear pain, eye pain, hearing loss, nose bleeds, sore throat Cardiac:  No dizziness, chest pain or heaviness, chest tightness,edema, No JVD Resp:   No cough, -sputum production, -shortness of breath,-wheezing, -hemoptysis,  Other:  All other systems negative   Physical Examination:   General Appearance: No distress  EYES PERRLA, EOM intact.   NECK Supple, No JVD Pulmonary: normal breath sounds, No wheezing.  CardiovascularNormal S1,S2.  No m/r/g.   Abdomen: Benign, Soft, non-tender. Neurology UE/LE 5/5 strength, no focal deficits Ext pulses intact, cap refill intact ALL OTHER ROS ARE NEGATIVE      IMAGING   CT chest 10/2018  No acute process, 3 mm nodules No further CT scans needed  CT chest August 2024 CT  was Independently Reviewed By Me Today       ASSESSMENT AND PLAN SYNOPSIS  67 year old pleasant white female seen today for follow-up assessment for asthma and abnormal CT chest with right middle lobe opacity and multiple subcentimeter lung nodules with previous history of oxygen  therapy with out any evidence of sleep apnea  Asthma well-controlled No indication for antibiotics or prednisone at this time Using Symbicort as needed Avoid secondhand smoke Avoid SICK contacts Recommend  Masking  when appropriate Recommend Keep up-to-date with vaccinations   Previous diagnosis of sleep apnea HST did not show any evidence of sleep apnea ONO did not reveal nocturnal hypoxia  History of abnormal CT chest with pneumonia Repeat CT chest reviewed with patient in detail today No significant changes noted   CURRENT MEDICATIONS REVIEWED AT LENGTH WITH PATIENT TODAY   Follow up in 6 months  Total time spent 35 mins  Shakai Dolley Santiago Glad, M.D.  [facility]   Medical Director [facility] Medical Director [facility] Cardio-Pulmonary Department

## 2023-02-15 DIAGNOSIS — F411 Generalized anxiety disorder: Secondary | ICD-10-CM | POA: Diagnosis not present

## 2023-02-15 DIAGNOSIS — F5104 Psychophysiologic insomnia: Secondary | ICD-10-CM | POA: Diagnosis not present

## 2023-02-15 DIAGNOSIS — F319 Bipolar disorder, unspecified: Secondary | ICD-10-CM | POA: Diagnosis not present

## 2023-02-16 DIAGNOSIS — M5451 Vertebrogenic low back pain: Secondary | ICD-10-CM | POA: Diagnosis not present

## 2023-02-16 DIAGNOSIS — M9905 Segmental and somatic dysfunction of pelvic region: Secondary | ICD-10-CM | POA: Diagnosis not present

## 2023-02-16 DIAGNOSIS — M9906 Segmental and somatic dysfunction of lower extremity: Secondary | ICD-10-CM | POA: Diagnosis not present

## 2023-02-16 DIAGNOSIS — M9907 Segmental and somatic dysfunction of upper extremity: Secondary | ICD-10-CM | POA: Diagnosis not present

## 2023-02-16 DIAGNOSIS — M9901 Segmental and somatic dysfunction of cervical region: Secondary | ICD-10-CM | POA: Diagnosis not present

## 2023-02-16 DIAGNOSIS — M9902 Segmental and somatic dysfunction of thoracic region: Secondary | ICD-10-CM | POA: Diagnosis not present

## 2023-02-16 DIAGNOSIS — M9903 Segmental and somatic dysfunction of lumbar region: Secondary | ICD-10-CM | POA: Diagnosis not present

## 2023-02-16 DIAGNOSIS — M9908 Segmental and somatic dysfunction of rib cage: Secondary | ICD-10-CM | POA: Diagnosis not present

## 2023-02-16 DIAGNOSIS — M79631 Pain in right forearm: Secondary | ICD-10-CM | POA: Diagnosis not present

## 2023-02-16 DIAGNOSIS — M25551 Pain in right hip: Secondary | ICD-10-CM | POA: Diagnosis not present

## 2023-02-16 DIAGNOSIS — R252 Cramp and spasm: Secondary | ICD-10-CM | POA: Diagnosis not present

## 2023-02-16 DIAGNOSIS — M9904 Segmental and somatic dysfunction of sacral region: Secondary | ICD-10-CM | POA: Diagnosis not present

## 2023-02-21 ENCOUNTER — Encounter: Payer: Self-pay | Admitting: Family Medicine

## 2023-03-03 ENCOUNTER — Encounter: Payer: Self-pay | Admitting: Physician Assistant

## 2023-03-07 DIAGNOSIS — F411 Generalized anxiety disorder: Secondary | ICD-10-CM | POA: Diagnosis not present

## 2023-03-07 DIAGNOSIS — F3132 Bipolar disorder, current episode depressed, moderate: Secondary | ICD-10-CM | POA: Diagnosis not present

## 2023-03-10 ENCOUNTER — Other Ambulatory Visit: Payer: Self-pay | Admitting: Family Medicine

## 2023-03-10 DIAGNOSIS — E785 Hyperlipidemia, unspecified: Secondary | ICD-10-CM

## 2023-03-16 ENCOUNTER — Ambulatory Visit
Admission: RE | Admit: 2023-03-16 | Discharge: 2023-03-16 | Disposition: A | Discharge status: Home / Self Care | Payer: Medicare Other | Source: Ambulatory Visit | Attending: Sports Medicine | Admitting: Sports Medicine

## 2023-03-16 ENCOUNTER — Encounter: Payer: Self-pay | Admitting: Diagnostic Neuroimaging

## 2023-03-16 ENCOUNTER — Other Ambulatory Visit: Payer: Self-pay | Admitting: Sports Medicine

## 2023-03-16 DIAGNOSIS — M25551 Pain in right hip: Secondary | ICD-10-CM

## 2023-03-16 DIAGNOSIS — M9908 Segmental and somatic dysfunction of rib cage: Secondary | ICD-10-CM | POA: Diagnosis not present

## 2023-03-16 DIAGNOSIS — M9904 Segmental and somatic dysfunction of sacral region: Secondary | ICD-10-CM | POA: Diagnosis not present

## 2023-03-16 DIAGNOSIS — M5459 Other low back pain: Secondary | ICD-10-CM | POA: Diagnosis not present

## 2023-03-16 DIAGNOSIS — M9905 Segmental and somatic dysfunction of pelvic region: Secondary | ICD-10-CM | POA: Diagnosis not present

## 2023-03-16 DIAGNOSIS — M9903 Segmental and somatic dysfunction of lumbar region: Secondary | ICD-10-CM | POA: Diagnosis not present

## 2023-03-16 DIAGNOSIS — M9906 Segmental and somatic dysfunction of lower extremity: Secondary | ICD-10-CM | POA: Diagnosis not present

## 2023-03-16 DIAGNOSIS — R600 Localized edema: Secondary | ICD-10-CM | POA: Diagnosis not present

## 2023-03-16 DIAGNOSIS — R519 Headache, unspecified: Secondary | ICD-10-CM | POA: Diagnosis not present

## 2023-03-16 DIAGNOSIS — M25561 Pain in right knee: Secondary | ICD-10-CM | POA: Diagnosis not present

## 2023-03-17 DIAGNOSIS — H401132 Primary open-angle glaucoma, bilateral, moderate stage: Secondary | ICD-10-CM | POA: Diagnosis not present

## 2023-03-28 DIAGNOSIS — F5104 Psychophysiologic insomnia: Secondary | ICD-10-CM | POA: Diagnosis not present

## 2023-03-28 DIAGNOSIS — F411 Generalized anxiety disorder: Secondary | ICD-10-CM | POA: Diagnosis not present

## 2023-03-28 DIAGNOSIS — F319 Bipolar disorder, unspecified: Secondary | ICD-10-CM | POA: Diagnosis not present

## 2023-04-04 ENCOUNTER — Ambulatory Visit (INDEPENDENT_AMBULATORY_CARE_PROVIDER_SITE_OTHER): Payer: Medicare Other | Admitting: Diagnostic Neuroimaging

## 2023-04-04 ENCOUNTER — Encounter: Payer: Self-pay | Admitting: Diagnostic Neuroimaging

## 2023-04-04 VITALS — BP 143/86 | HR 87 | Ht 61.0 in | Wt 119.2 lb

## 2023-04-04 DIAGNOSIS — R200 Anesthesia of skin: Secondary | ICD-10-CM

## 2023-04-04 DIAGNOSIS — G43109 Migraine with aura, not intractable, without status migrainosus: Secondary | ICD-10-CM | POA: Diagnosis not present

## 2023-04-04 MED ORDER — AJOVY 225 MG/1.5ML ~~LOC~~ SOAJ: 225.0000 mg | SUBCUTANEOUS | 4 refills | Status: DC

## 2023-04-04 MED ORDER — NURTEC 75 MG PO TBDP: 75.0000 mg | ORAL_TABLET | ORAL | 12 refills | Status: DC | PRN

## 2023-04-04 MED ORDER — RIZATRIPTAN BENZOATE 10 MG PO TBDP: 10.0000 mg | ORAL_TABLET | ORAL | 12 refills | Status: DC | PRN

## 2023-04-04 NOTE — Progress Notes (Signed)
 [facility]  PATIENT: Theresa Martin DOB: 06-May-1955  REFERRING CLINICIAN: Mahlon Comer BRAVO, MD  HISTORY FROM: patient REASON FOR VISIT: follow up   HISTORICAL  CHIEF COMPLAINT:  Chief Complaint  Patient presents with   Follow-up    Pt in 7 Pt here for Migraine f/u Pt states increased migraines . Pt states 24 migraines in last month Pt states she thinks she took  too many rizatriptan  tablets     HISTORY OF PRESENT ILLNESS:   UPDATE (04/04/23, VRP): Since last visit, continues with headaches (15 major headaches per month on avg; 24 in last month). Tolerating meds. Stress levels high over the holidays, but now slightly better.   UPDATE (09/28/22, VRP): Since last visit, still with numbness on head, tongue, toes. No fluctuation. No weakness. Still with some stress issues (car issue, glasses). Still with headache issues.  UPDATE (09/14/22, VRP): Since last visit, doing well until 09/10/22 had onset of slurred speech, tongue numbness at pulmonary appt, then went to ER for eval. MRI, CTA negative, except for some fibromuscular dysplasia findings incidentally noted. Could have been complicated migraine.    UPDATE (05/26/22, VRP): Since last visit, doing well. Symptoms are improved. Now doing well with ajovy  injections. 1-2 HA per month. Mainly stress associated. Nurtec and rizatriptan  working well. Depression still an issue.   UPDATE (05/25/21, VRP): Since last visit, doing well, except recent injection site reaction; used same ajovy  needle 3 times (05/20/21), b/c of retained medication in needle. Then had red and swelling around site. Now on steroid and abx, and sxs getting better. Never had this issue before.  UPDATE (11/17/20, VRP): Since last visit, doing well until 3 weeks ago; returned from vacation with family ([place]; fun but stressful). Then almost daily left sided throbbing HA, left side numbness. Tried gabapentin  increase, prednisone , indomethacin ; no  relief. Phonophobia. No nausea.   UPDATE (11/06/20, MM): 68 year old female with a history of migraine headaches.  She returns today for follow-up.  She reports that she has had an ongoing headache for 2 weeks.  Reports that it starts on the left side behind the eye.  She reports that she has a tingling sensation that radiates down the face and down the arm into the leg and toes.  She has been taking an over-the-counter medication Tylenol  and Advil  on a daily basis.  Her sports medicine doctor saw her in the last 2 weeks and did an injection.  She is not sure what the injection was.  His notes is not in epic.  She also reports that she was given a prescription for tramadol.  She states that several years ago she had a severe headache with tingling sensations down the left side of the body and went to the emergency room.  Reports that she had a complete stroke work-up that was negative.  Patient had MRI of the brain with and without contrast in June 2021 that was unremarkable.  She remains on gabapentin  400 mg twice a day.  Reports that this has been controlling her headaches up until the last 2 weeks.  UPDATE (08/08/19, VRP): Since last visit, doing well, except past 3 months, has left eye pulling sensation, left eye pain, then left head pain. Some sensations in right eye. Also with mild glaucoma.   UPDATE (03/21/19, VRP): Since last visit, doing well until COVID pandemic. She weaned herself off her psych meds, then mania, psychosis . Then headaches worsened. Admitted to behavioral health. Now psych issues are better,  and headache resolved since last month.   UPDATE (01/30/18, VRP): Since last visit, doing worse with HA (14-15 per month). More stress levels than last visit. Burning sensation in scalp continues.  UPDATE (08/24/17, VRP): Since last visit, doing well. Tolerating propranolol . Avg 4-13 migraine per month. No alleviating or aggravating factors.   Separately, had right thoracic burning pain (no  rash) in fall 2018, and was tx'd empirically with acyclovir . Then with right scalp burning pain last week; also with chronic left scalp sensitivity with migraines.  UPDATE 08/24/16: Since last visit, avg 2-3 HA per month. Tolerating meds. No new issues. Mood stable. Weight improved.   UPDATE 02/23/16: Since last visit has ~ 1-5 HA per month. Tolerating propranolol  + OTC tylenol /aleve  for HA mgmt.   UPDATE 08/19/15: Since last visit, avg 1-10 HA per month. Usually with stress. Some more wt gain noted (~10lbs). Also had a a laser eye procedure for angle closure glaucoma, now improved.  UPDATE 04/15/15: Since last visit, doing well. No more migraine since 02/28/15. Propranolol  is helping prevent HA. Mood stable.  UPDATE 01/13/15: Since last visit, having more headaches, and more fluid retention. Now being tapered off gabapentin  by psychiatry due to side effects.   UPDATE 09/02/14: Doing well. Avg 3-6 days HA per month. Some HA are mild, some are severe. Taking gabapentin  200mg  TID. Works out every day at [employer]. Overall mood is better as well.  UPDATE 05/17/14: Since last visit, patient had some psychiatry issues, was managed by behavioral health, and her headaches significant improved. She was doing well for several years and did not follow-up in our clinic. Patient here with her father today for this visit. Patient's father notes that her headaches seem to be significantly associated with her stress levels. In the last few months her stress levels have increased significantly. Her stress levels related to her ex-husband and her daughters, and some family issues. Patient having intermittent, almost daily left-sided headaches, with numbness and tingling in the left scalp, typically in the evening when she is home. During the daytime patient stays active, works out several times a week, and does better. Patient had been on gabapentin  100 mg at bedtime for several years. Her psychiatrist increased this to 2 and  then 3 capsules at bedtime. When she did 3 capsules at bedtime, her headache type symptoms paradoxical he worsened. She then went back to taking one capsule at bedtime. Strangely, she reports that she was told she was able to take this medication (gabapentin ) as needed as well and for the last few weeks has been taking one capsule at bedtime, followed by 1-2 capsules every hour, throughout the night, taking up to 10-14 capsules in a 12 hour period.   PRIOR HPI (11/04/08 - 06/03/09, VRP): 68 year old right-handed female with history of high blood pressure, depression and anxiety presenting for evaluation of chronic headaches and abnormal MRI scan.  On October 26, 2008 the patient presented to the emergency room for right-sided chest pain and was diagnosed with pneumonia.   At this time her headaches worsened in severity.  Upon review of prior MRIs demonstrating microvascular gliosis, patient was referred to our neurology clinic for futher evaluation. Patient's headaches began in 2005 consisting of a dull pressure like sensation over the top of her head. Occasionally these are associated with mild nausea without vomiting as well as intermittent left facial numbness.  Patient had dull nagging low level headaches on a daily basis with daily flareups involving a hot sensation over  her scalp.  Sometimes this sensation starts as posterior neck pain that moves up the back of her head. She was initially evaluated with MRI scans in 2005 and 2006 and started on topiramate 100 mg at bedtime.   In addition she was taking Advil  and Tylenol  over-the-counter almost a daily basis.   During flareups the patient would have to lay down in place ice packs over her eyes and the top of her head.   Patient's headaches are aggravated by lack of sleep or stress. No food triggers noted.  Her other physicians decided to taper her off of NSAIDs, tylenol  and also her topiramate. Patient had MRI of the head and neck, found to have some nonspecific  white matter lesions. Patient had lumbar puncture and additional blood testing, but multiple sclerosis was ruled out. Patient was treated with several occipital nerve blocks with good results.   REVIEW OF SYSTEMS: Full 14 system review of systems performed and negative except for: as per HPI.   ALLERGIES: No Known Allergies  HOME MEDICATIONS: Outpatient Medications Prior to Visit  Medication Sig Dispense Refill   atorvastatin  (LIPITOR) 20 MG tablet TAKE 1 TABLET BY MOUTH EVERY DAY 90 tablet 1   buPROPion (WELLBUTRIN) 100 MG tablet Take 100 mg by mouth daily at 2 PM.     Cholecalciferol  (VITAMIN D3 PO) Take 5,000 Units by mouth.     clonazePAM  (KLONOPIN ) 0.5 MG tablet Take 0.5 mg by mouth daily as needed.     diclofenac Sodium (VOLTAREN) 1 % GEL Apply 1 Application topically 4 (four) times daily as needed.     ELDERBERRY PO Take by mouth.     famotidine (PEPCID) 20 MG tablet Take 20 mg by mouth 2 (two) times daily.     latanoprost  (XALATAN ) 0.005 % ophthalmic solution Place 1 drop into both eyes at bedtime. 2.5 mL 12   linaclotide  (LINZESS ) 290 MCG CAPS capsule Take 1 capsule (290 mcg total) by mouth daily before breakfast. 30 capsule 5   Misc Natural Products (JOINT HEALTH PO) Take by mouth.     Multiple Vitamins-Minerals (WOMENS MULTIVITAMIN PO) Take by mouth.     pantoprazole  (PROTONIX ) 40 MG tablet Take 1 tablet (40 mg total) by mouth daily. 30 tablet 5   Probiotic Product (PROBIOTIC PO) Take by mouth.     QUEtiapine  (SEROQUEL  XR) 400 MG 24 hr tablet Take 1 tablet (400 mg total) by mouth at bedtime. (Patient taking differently: Take 800 mg by mouth at bedtime.) 30 tablet 1   Sennosides (SENOKOT PO) Take 20 mg by mouth in the morning and at bedtime.     temazepam  (RESTORIL ) 15 MG capsule Take 1 capsule (15 mg total) by mouth at bedtime. 30 capsule 0   triamterene -hydrochlorothiazide  (MAXZIDE -25) 37.5-25 MG tablet TAKE 1 TABLET BY MOUTH EVERY DAY 90 tablet 1   Turmeric (QC TUMERIC  COMPLEX) 500 MG CAPS Take by mouth.     UNABLE TO FIND Take 2 tablets by mouth at bedtime. Med Name: magnesium  L theronate  650 mg at HS     zinc gluconate 50 MG tablet Take 50 mg by mouth daily.     eletriptan  (RELPAX ) 40 MG tablet Take 1 tablet (40 mg total) by mouth as needed for migraine or headache. May repeat in 2 hours if headache persists or recurs. 10 tablet 0   Fremanezumab -vfrm (AJOVY ) 225 MG/1.5ML SOAJ Inject 225 mg into the skin every 30 (thirty) days. 4.5 mL 1   Rimegepant Sulfate  (NURTEC) 75 MG  TBDP Take 1 tablet (75 mg total) by mouth as needed (for breakthrough migraine). 8 tablet 12   rizatriptan  (MAXALT -MLT) 10 MG disintegrating tablet Take 1 tablet (10 mg total) by mouth as needed for migraine (May repeat in 2 hours if needed). 10 tablet 12   albuterol  (VENTOLIN  HFA) 108 (90 Base) MCG/ACT inhaler TAKE 2 PUFFS BY MOUTH EVERY 6 HOURS AS NEEDED FOR WHEEZE OR SHORTNESS OF BREATH 18 g 2   budesonide -formoterol  (SYMBICORT ) 160-4.5 MCG/ACT inhaler Inhale 2 puffs into the lungs 2 (two) times daily. in the morning and at bedtime. (Patient taking differently: Inhale 1 puff into the lungs daily.) 10.2 each 5   escitalopram  (LEXAPRO ) 10 MG tablet Take 10 mg by mouth daily.     montelukast  (SINGULAIR ) 10 MG tablet TAKE 1 TABLET BY MOUTH EVERYDAY AT BEDTIME 90 tablet 1   Spacer/Aero-Holding Chambers (AEROCHAMBER PLUS) inhaler Use as instructed 1 each 2   No facility-administered medications prior to visit.    PAST MEDICAL HISTORY: Past Medical History:  Diagnosis Date   Allergy    Anxiety    Asthma    Depression    Hyperlipidemia    Hypertension    Migraines    Osteopenia after menopause    Persistent headaches    Pneumonia 2020   Postherpetic neuralgia    Renal disorder    Shingles outbreak 02/2014   Skin cancer    moles on right groin and right buttock.   Slurred speech 09/09/2022    PAST SURGICAL HISTORY: Past Surgical History:  Procedure Laterality Date    CARDIOVASCULAR STRESS TEST  02/2009   treadmill stress test: Low risk   CESAREAN SECTION  1987, 1989   COLONOSCOPY     2014/2015 [place], normal   COLONOSCOPY WITH PROPOFOL  N/A 10/20/2021   Procedure: COLONOSCOPY WITH PROPOFOL ;  Surgeon: Therisa Bi, MD;  Location: [facility] ENDOSCOPY;  Service: Gastroenterology;  Laterality: N/A;   COLONOSCOPY WITH PROPOFOL  N/A 09/02/2022   Procedure: COLONOSCOPY WITH PROPOFOL ;  Surgeon: Unk Corinn Skiff, MD;  Location: [facility] ENDOSCOPY;  Service: Gastroenterology;  Laterality: N/A;  Requests about 11:15 arrival   DIAGNOSTIC LAPAROSCOPY     ESOPHAGOGASTRODUODENOSCOPY (EGD) WITH PROPOFOL  N/A 09/02/2022   Procedure: ESOPHAGOGASTRODUODENOSCOPY (EGD) WITH PROPOFOL ;  Surgeon: Unk Corinn Skiff, MD;  Location: [facility] ENDOSCOPY;  Service: Gastroenterology;  Laterality: N/A;   EXPLORATORY LAPAROTOMY     laproscopy for infertility   EYE SURGERY Bilateral    laser-correct opening b/tn cornea and iris   MOLE REMOVAL  2017   x 2 moles    FAMILY HISTORY: Family History  Problem Relation Age of Onset   Hypertension Mother    Coronary artery disease Mother    Heart failure Mother    Transient ischemic attack Mother    Hypertension Father    Liver disease Sister        liver failure   Migraines Sister    Arrhythmia Brother    Hypertension Brother    Cancer Neg Hx        colon    Breast cancer Neg Hx     SOCIAL HISTORY:  Social History   Socioeconomic History   Marital status: Legally Separated    Spouse name: Not on file   Number of children: 2   Years of education: Not on file   Highest education level: 12th grade  Occupational History   Occupation: Geologist, engineering   Occupation: retired  Tobacco Use   Smoking status: Never   Smokeless tobacco: Never  Vaping Use   Vaping status: Never Used  Substance and Sexual Activity   Alcohol  use: No    Alcohol /week: 0.0 standard drinks of alcohol    Drug use: No   Sexual activity: Not Currently  Other  Topics Concern   Not on file  Social History Narrative   Regular exercise- yes, spins daily   Diet: fruits and veggies, water     No caffeine   Lives alone    Retired    Teacher, 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 Risk  (07/31/2022)   Overall Financial Resource Strain (CARDIA)    Difficulty of Paying Living Expenses: Not hard at all  Food Insecurity: No Food Insecurity (09/09/2022)   Hunger Vital Sign    Worried About Running Out of Food in the Last Year: Never true    Ran Out of Food in the Last Year: Never true  Transportation Needs: No Transportation Needs (09/09/2022)   PRAPARE - Administrator, Civil Service (Medical): No    Lack of Transportation (Non-Medical): No  Physical Activity: Insufficiently Active (07/31/2022)   Exercise Vital Sign    Days of Exercise per Week: 2 days    Minutes of Exercise per Session: 20 min  Stress: Stress Concern Present (07/31/2022)   [employer] of Occupational Health - Occupational Stress Questionnaire    Feeling of Stress : Rather much  Social Connections: Moderately Integrated (07/31/2022)   Social Connection and Isolation Panel [NHANES]    Frequency of Communication with Friends and Family: More than three times a week    Frequency of Social Gatherings with Friends and Family: Once a week    Attends Religious Services: More than 4 times per year    Active Member of [employer] or Organizations: Yes    Attends Banker Meetings: More than 4 times per year    Marital Status: Separated  Intimate Partner Violence: Not At Risk (09/09/2022)   Humiliation, Afraid, Rape, and Kick questionnaire    Fear of Current or Ex-Partner: No    Emotionally Abused: No    Physically Abused: No    Sexually Abused: No     PHYSICAL EXAM  Vitals:   04/04/23 1516  BP: (!) 143/86  Pulse: 87  Weight: 119 lb 3.2 oz (54.1 kg)  Height: 5' 1 (1.549 m)    Wt Readings from Last 3 Encounters:  04/04/23 119 lb 3.2 oz (54.1 kg)   02/11/23 124 lb 3.2 oz (56.3 kg)  11/10/22 120 lb 3.2 oz (54.5 kg)   Body mass index is 22.52 kg/m.  No results found.     06/03/2016    3:23 PM  MMSE - Mini Mental State Exam  Orientation to time 5  Orientation to Place 5  Registration 3  Attention/ Calculation 5  Recall 3  Language- name 2 objects 2  Language- repeat 1  Language- follow 3 step command 3  Language- read & follow direction 1  Write a sentence 1  Copy design 1  Total score 30    GENERAL EXAM: Patient is in no distress; well developed, nourished and groomed; neck is supple  CARDIOVASCULAR: Regular rate and rhythm, no murmurs, no carotid bruits  NEUROLOGIC: MENTAL STATUS: awake, alert, language fluent, comprehension intact, naming intact, fund of knowledge appropriate CRANIAL NERVE: pupils equal and reactive to light, visual fields full to confrontation, extraocular muscles intact, no nystagmus, facial sensation and strength symmetric, hearing intact, palate elevates symmetrically, uvula midline, shoulder shrug symmetric, tongue  midline. MOTOR: normal bulk and tone, full strength in the BUE, BLE SENSORY: normal and symmetric to light touch, temperature, vibration COORDINATION: finger-nose-finger, fine finger movements normal REFLEXES: deep tendon reflexes present and symmetric GAIT/STATION: narrow based gait; romberg is negative    DIAGNOSTIC DATA (LABS, IMAGING, TESTING) - I reviewed patient records, labs, notes, testing and imaging myself where available.  Lab Results  Component Value Date   WBC 6.7 09/09/2022   HGB 12.4 09/09/2022   HCT 37.1 09/09/2022   MCV 92.1 09/09/2022   PLT 240 09/09/2022      Component Value Date/Time   NA 134 (L) 09/09/2022 1300   NA 140 12/10/2020 0954   K 3.6 09/09/2022 1300   CL 98 09/09/2022 1300   CO2 28 09/09/2022 1300   GLUCOSE 114 (H) 09/09/2022 1300   BUN 14 09/09/2022 1300   BUN 18 12/10/2020 0954   CREATININE 0.60 09/09/2022 1300   CREATININE 0.90  06/08/2017 1534   CALCIUM  8.7 (L) 09/09/2022 1300   PROT 7.4 09/09/2022 1300   PROT 6.8 12/10/2020 0954   ALBUMIN 4.4 09/09/2022 1300   ALBUMIN 4.6 12/10/2020 0954   AST 39 09/09/2022 1300   ALT 37 09/09/2022 1300   ALKPHOS 74 09/09/2022 1300   BILITOT 0.5 09/09/2022 1300   BILITOT <0.2 12/10/2020 0954   GFRNONAA >60 09/09/2022 1300   GFRAA >60 02/11/2019 0702   Lab Results  Component Value Date   CHOL 153 09/10/2022   HDL 61 09/10/2022   LDLCALC 79 09/10/2022   TRIG 64 09/10/2022   CHOLHDL 2.5 09/10/2022   Lab Results  Component Value Date   HGBA1C 5.2 09/09/2022   Lab Results  Component Value Date   VITAMINB12 1,257 (H) 01/12/2021   Lab Results  Component Value Date   TSH 0.82 08/04/2022    11/13/08 LP - opening pressure 7cm H2O; WBC 1, RBC 0, glucose 62, protein 26, OCB (2 in CSF, not seen in serum), IgG index 0.5 (normal), lyme PCR neg, EBV PCR neg  11/13/08 VEP - normal  11/08/08 MRI cervical - normal  11/08/08 MRI brain (with and without contrast) - multiple supratentorial, periventricular and juxtacortical white matter lesions which may represent chronic demyelinating plaques or perivascular gliosis.  No abnormal enhancement on postcontrast views.   08/28/17 MRI brain  1.  Scattered T2/FLAIR hyperintense foci predominantly in the deep and subcortical white matter.  This is a nonspecific finding and most likely represents chronic microvascular ischemic change.  The pattern is not typical for demyelination.  The foci are not acute and they do not enhance after contrast.  When compared to the MRI dated 05/20/2004, there has been only slight progression in the number or size of the foci. 2.  There is a normal enhancement pattern and there are no acute findings.   09/30/18 MRI brain [I reviewed images myself and agree with interpretation. -VRP]  - No acute or subacute infarction. - Numerous scattered foci of T2 and FLAIR signal within the cerebral hemispheric deep and  subcortical white matter. The differential diagnosis on the basis of the imaging would be demyelinating disease versus small-vessel disease. Findings appear stable since June of last year but are progressive since 2006. Given the history of hypertension and hyperlipidemia, small-vessel disease is probably more likely  09/21/19 MRI brain - Abnormal MRI scan of the brain showing bilateral nonspecific periventricular and subcortical white matter hyperintensities with a differential discussed above.  No enhancing lesions are noted.  There are mild incidental  changes of chronic paranasal sinus inflammation.  Overall no significant change compared with previous MRI from 09/30/2018  12/16/20: This MRI of the brain with and without contrast shows the following: 1.   Scattered T2/FLAIR hyperintense foci predominantly in the subcortical and deep white matter of both hemispheres.  This is a nonspecific finding but is most consistent with chronic microvascular ischemic change.  Demyelination or vasculitis would be less likely to have this pattern.  None of the foci appear to be acute.  They do not enhance.  Compared to the MRI dated 09/21/2019, there are no new lesions. 2.   Normal enhancement pattern.  No acute findings.  09/09/22 MRI brain No acute intracranial process. No evidence of acute or subacute infarct.  09/09/22 CTA head / neck 1. Beaded appearance of the cervical internal carotid arteries bilaterally consistent with fibromuscular dysplasia. 2. No significant associated stenosis. 3. Atherosclerotic changes at the left carotid bifurcation without significant stenosis. 4. Normal variant CTA Circle of Willis without significant proximal stenosis, aneurysm, or branch vessel occlusion.    ASSESSMENT AND PLAN  67 y.o. year old female here with mixed tension and migraine headaches, left occipital neuralgia, and longer standing significant depression/anxiety. Some headaches have occipital neuralgia type  features. Symptoms seem to be worse with stress and external factors.   Meds tried: botox, gabapentin , propranolol , indomethacin , ibuprofen , tylenol , toradol , rizatriptan , eletriptan , nurtec  Dx:  Migraine with aura and without status migrainosus, not intractable  Numbness    PLAN:  MIGRAINE WITH AURA (history of angle closure glaucoma; cannot use topiramate)  MIGRAINE PREVENTION  LIFESTYLE CHANGES -Stop or avoid smoking -Decrease or avoid caffeine / alcohol  -Eat and sleep on a regular schedule -Exercise several times per week - continue fremanezumab  (Ajovy ) 225mg  monthly - consider qulipta, aimovig or emgality in future  MIGRAINE RESCUE  - ibuprofen , tylenol  as needed - rizatriptan  10mg  and nurtec 75mg  as needed   NUMBNESS (scalp, tongue, toes; migraine phenomenon, stress reaction or benign paresthesias) - non-specific; MRI and labs unremarkable; neuro exam was unremarkable at last visit - follow up rheumatology for ANA positive - follow up with psychiatry (anxiety, stress reaction)  MILD SLEEP APNEA - follow up with pulmonary / ENT / PCP (already had home sleep study; now on oxygen )  Schizoaffective schizophrenia / anxiety / depression (per psychiatry) - continue seroquel  800mg  at bedtime  Return in about 1 year (around 04/03/2024) for MyChart visit (15 min).     EDUARD FABIENE HANLON, MD 04/04/2023, 4:08 PM Certified in Neurology, Neurophysiology and Neuroimaging  [facility] [address] [place], [place] [address] [phone]

## 2023-04-05 ENCOUNTER — Ambulatory Visit: Payer: Medicare Other | Admitting: Obstetrics

## 2023-04-06 ENCOUNTER — Ambulatory Visit: Payer: Medicare Other | Admitting: Obstetrics

## 2023-04-06 DIAGNOSIS — F3132 Bipolar disorder, current episode depressed, moderate: Secondary | ICD-10-CM | POA: Diagnosis not present

## 2023-04-06 DIAGNOSIS — F411 Generalized anxiety disorder: Secondary | ICD-10-CM | POA: Diagnosis not present

## 2023-04-07 ENCOUNTER — Telehealth: Payer: Self-pay | Admitting: Family Medicine

## 2023-04-07 ENCOUNTER — Ambulatory Visit: Payer: Medicare Other | Admitting: Internal Medicine

## 2023-04-07 NOTE — Telephone Encounter (Signed)
 Copied from CRM [id number]. Topic: Appointments - Other >> Apr 06, 2023  1:22 PM Corean SAUNDERS wrote: Reason for CRM: Patient's OBGYN states the clinic [facility]) cancelled her appointment with them and patient would like to know why as she needs her pap smear.  Pt has made an appt but not sure who would have canceled appt and definitely not from here.

## 2023-04-07 NOTE — Telephone Encounter (Signed)
 Left vm to call office back

## 2023-04-08 ENCOUNTER — Telehealth: Payer: Self-pay

## 2023-04-08 ENCOUNTER — Ambulatory Visit: Payer: Medicare Other | Admitting: Family Medicine

## 2023-04-08 ENCOUNTER — Telehealth: Payer: Self-pay | Admitting: Physician Assistant

## 2023-04-08 DIAGNOSIS — K5909 Other constipation: Secondary | ICD-10-CM

## 2023-04-08 MED ORDER — LACTULOSE 20 GM/30ML PO SOLN: 30.0000 mL | Freq: Three times a day (TID) | ORAL | 5 refills | Status: AC | PRN

## 2023-04-08 NOTE — Telephone Encounter (Signed)
 This makes no sense.  [facility] should not be able to cancel or reschedule appts for other specialties.  If the pt was calling to cancel her appt with [place], they need to ensure that is the one they are addressing.  I definitely apologize for the error

## 2023-04-08 NOTE — Telephone Encounter (Signed)
 Instructed patient to go to [employer] and get the discounted card for 90 day supply.

## 2023-04-08 NOTE — Telephone Encounter (Signed)
 I have reported this error to [facility] as directed.

## 2023-04-08 NOTE — Telephone Encounter (Signed)
 Patient called stating Linzess 290 is too expensive.  Cost prohibitive. I recommend switch to lactulose 30 mL 3 times daily as needed for constipation. I sent Rx for lactulose to her pharmacy. Celso Amy, PA-C

## 2023-04-08 NOTE — Telephone Encounter (Signed)
 Patient states Linzess is to expensive. I instructed her to try [email or web address] and see if she is eligable for coupon- she is not and would like to know what she can try.

## 2023-04-08 NOTE — Telephone Encounter (Signed)
 Spoke with pt and she states she went to her GYN office on the 04/05/2022 and her appt was canceled and this was from our office. I did apologize  to pt and explained this was no one in out office and would have my office manager look into this.

## 2023-04-08 NOTE — Telephone Encounter (Signed)
Patient called back to speak with [facility].

## 2023-04-11 ENCOUNTER — Encounter: Payer: Self-pay | Admitting: Diagnostic Neuroimaging

## 2023-04-12 DIAGNOSIS — M1990 Unspecified osteoarthritis, unspecified site: Secondary | ICD-10-CM | POA: Insufficient documentation

## 2023-04-12 DIAGNOSIS — H04129 Dry eye syndrome of unspecified lacrimal gland: Secondary | ICD-10-CM | POA: Insufficient documentation

## 2023-04-12 DIAGNOSIS — M5451 Vertebrogenic low back pain: Secondary | ICD-10-CM | POA: Diagnosis not present

## 2023-04-12 DIAGNOSIS — M25551 Pain in right hip: Secondary | ICD-10-CM | POA: Diagnosis not present

## 2023-04-12 DIAGNOSIS — M546 Pain in thoracic spine: Secondary | ICD-10-CM | POA: Diagnosis not present

## 2023-04-12 DIAGNOSIS — M7918 Myalgia, other site: Secondary | ICD-10-CM | POA: Diagnosis not present

## 2023-04-12 DIAGNOSIS — M25561 Pain in right knee: Secondary | ICD-10-CM | POA: Diagnosis not present

## 2023-04-13 ENCOUNTER — Ambulatory Visit (INDEPENDENT_AMBULATORY_CARE_PROVIDER_SITE_OTHER): Payer: Medicare Other | Admitting: Family Medicine

## 2023-04-13 ENCOUNTER — Encounter: Payer: Self-pay | Admitting: Family Medicine

## 2023-04-13 VITALS — BP 140/80 | HR 80 | Temp 97.8°F | Ht 61.0 in | Wt 120.0 lb

## 2023-04-13 DIAGNOSIS — F331 Major depressive disorder, recurrent, moderate: Secondary | ICD-10-CM

## 2023-04-13 DIAGNOSIS — I10 Essential (primary) hypertension: Secondary | ICD-10-CM

## 2023-04-13 DIAGNOSIS — E785 Hyperlipidemia, unspecified: Secondary | ICD-10-CM | POA: Diagnosis not present

## 2023-04-13 NOTE — Assessment & Plan Note (Signed)
Chronic problem.  Currently on Lipitor 20mg daily w/o difficulty.  Check labs.  Adjust meds prn  

## 2023-04-13 NOTE — Patient Instructions (Signed)
 Follow up in 6 months to recheck BP and cholesterol We'll notify you of your lab results and make any changes if needed Keep up the good work on healthy diet and regular exercise- you look great! We'll call you to schedule your psych appt Call with any questions or concerns Stay Safe!  Stay Healthy! Happy New Year!!

## 2023-04-13 NOTE — Assessment & Plan Note (Signed)
 Chronic problem.  Pt's BP is mildly elevated today but says this is the case since she switched to Bupropion.  She is in the process of switching back to her previous medications.  Currently asymptomatic but will follow.

## 2023-04-13 NOTE — Assessment & Plan Note (Signed)
 Ongoing issue for pt.  She is in the process of adjusting her meds as she doesn't feel she is tolerating the Bupropion well.  Is asking for a new psych referral- placed.

## 2023-04-13 NOTE — Progress Notes (Signed)
   Subjective:    Patient ID: Theresa Martin, female    DOB: 1955-08-20, 68 y.o.   MRN: [id number]  HPI HTN- chronic problem, on Triamterene  hydrochlorothiazide  37.5/25mg  daily and BP is mildly elevated today.  Pt reports BP has been elevated- as has her anxiety and her HA's have been worse since switching to Wellbutrin.  No CP, SOB, visual changes, edema.  Hyperlipidemia- chronic problem, on Lipitor 20mg  daily.  No abd pain, N/V.  Depression- pt reports that she was feeling more down so Psych switched her from Lexapro  to Wellbutrin.  Since then, she has been more anxious, increased HA's, elevated BP.  Has also been having hot flashes.  Is asking for a new referral.   Review of Systems For ROS see HPI     Objective:   Physical Exam Vitals reviewed.  Constitutional:      General: She is not in acute distress.    Appearance: Normal appearance. She is well-developed. She is not ill-appearing.  HENT:     Head: Normocephalic and atraumatic.  Eyes:     Conjunctiva/sclera: Conjunctivae normal.     Pupils: Pupils are equal, round, and reactive to light.  Neck:     Thyroid : No thyromegaly.  Cardiovascular:     Rate and Rhythm: Normal rate and regular rhythm.     Pulses: Normal pulses.     Heart sounds: Normal heart sounds. No murmur heard. Pulmonary:     Effort: Pulmonary effort is normal. No respiratory distress.     Breath sounds: Normal breath sounds.  Abdominal:     General: There is no distension.     Palpations: Abdomen is soft.     Tenderness: There is no abdominal tenderness.  Musculoskeletal:     Cervical back: Normal range of motion and neck supple.     Right lower leg: No edema.     Left lower leg: No edema.  Lymphadenopathy:     Cervical: No cervical adenopathy.  Skin:    General: Skin is warm and dry.  Neurological:     General: No focal deficit present.     Mental Status: She is alert and oriented to person, place, and time.  Psychiatric:        Mood and  Affect: Mood normal.        Behavior: Behavior normal.        Thought Content: Thought content normal.           Assessment & Plan:

## 2023-04-14 ENCOUNTER — Encounter: Payer: Self-pay | Admitting: Physician Assistant

## 2023-04-14 LAB — HEPATIC FUNCTION PANEL
ALT: 30 U/L (ref 0–35)
AST: 29 U/L (ref 0–37)
Albumin: 4.6 g/dL (ref 3.5–5.2)
Alkaline Phosphatase: 83 U/L (ref 39–117)
Bilirubin, Direct: 0 mg/dL (ref 0.0–0.3)
Total Bilirubin: 0.3 mg/dL (ref 0.2–1.2)
Total Protein: 7.7 g/dL (ref 6.0–8.3)

## 2023-04-14 LAB — LIPID PANEL
Cholesterol: 189 mg/dL (ref 0–200)
HDL: 65 mg/dL (ref >39.00)
LDL Cholesterol: 108 mg/dL — ABNORMAL HIGH (ref 0–99)
NonHDL: 124.22
Total CHOL/HDL Ratio: 3
Triglycerides: 81 mg/dL (ref 0.0–149.0)
VLDL: 16.2 mg/dL (ref 0.0–40.0)

## 2023-04-14 LAB — CBC WITH DIFFERENTIAL/PLATELET
Basophils Absolute: 0.1 10*3/uL (ref 0.0–0.1)
Basophils Relative: 0.5 % (ref 0.0–3.0)
Eosinophils Absolute: 0 10*3/uL (ref 0.0–0.7)
Eosinophils Relative: 0.2 % (ref 0.0–5.0)
HCT: 39.7 % (ref 36.0–46.0)
Hemoglobin: 12.9 g/dL (ref 12.0–15.0)
Lymphocytes Relative: 7.9 % — ABNORMAL LOW (ref 12.0–46.0)
Lymphs Abs: 1 10*3/uL (ref 0.7–4.0)
MCHC: 32.5 g/dL (ref 30.0–36.0)
MCV: 92.6 fL (ref 78.0–100.0)
Monocytes Absolute: 0.8 10*3/uL (ref 0.1–1.0)
Monocytes Relative: 6.3 % (ref 3.0–12.0)
Neutro Abs: 11.1 10*3/uL — ABNORMAL HIGH (ref 1.4–7.7)
Neutrophils Relative %: 85.1 % — ABNORMAL HIGH (ref 43.0–77.0)
Platelets: 304 10*3/uL (ref 150.0–400.0)
RBC: 4.29 Mil/uL (ref 3.87–5.11)
RDW: 12.7 % (ref 11.5–15.5)
WBC: 13.1 10*3/uL — ABNORMAL HIGH (ref 4.0–10.5)

## 2023-04-14 LAB — BASIC METABOLIC PANEL
BUN: 16 mg/dL (ref 6–23)
CO2: 29 meq/L (ref 19–32)
Calcium: 9.5 mg/dL (ref 8.4–10.5)
Chloride: 101 meq/L (ref 96–112)
Creatinine, Ser: 0.68 mg/dL (ref 0.40–1.20)
GFR: 89.58 mL/min (ref >60.00)
Glucose, Bld: 84 mg/dL (ref 70–99)
Potassium: 4.2 meq/L (ref 3.5–5.1)
Sodium: 137 meq/L (ref 135–145)

## 2023-04-14 LAB — TSH: TSH: 0.73 u[IU]/mL (ref 0.35–5.50)

## 2023-04-15 ENCOUNTER — Telehealth: Payer: Self-pay

## 2023-04-15 ENCOUNTER — Other Ambulatory Visit: Payer: Self-pay

## 2023-04-15 DIAGNOSIS — D72829 Elevated white blood cell count, unspecified: Secondary | ICD-10-CM

## 2023-04-15 NOTE — Progress Notes (Signed)
cbc

## 2023-04-15 NOTE — Telephone Encounter (Signed)
-----   Message from Neena Rhymes sent at 04/15/2023  7:45 AM EST ----- Labs look good w/ exception of mildly elevated white blood cell count and type of white blood cell count (Neutrophils).  This can occur when taking steroids or when fighting some type of infection.  We will repeat your CBC at a lab only visit in 1 week to ensure this is back in normal range (CBC w/ diff, dx Leukocytosis)

## 2023-04-15 NOTE — Telephone Encounter (Signed)
Pt has been notified Pt reports she will go to a lab in [place] to have lab drawn Lab has been ordered

## 2023-04-19 ENCOUNTER — Telehealth: Payer: Self-pay

## 2023-04-19 ENCOUNTER — Other Ambulatory Visit: Payer: Self-pay | Admitting: Physician Assistant

## 2023-04-19 ENCOUNTER — Telehealth: Payer: Self-pay | Admitting: Physician Assistant

## 2023-04-19 MED ORDER — LINACLOTIDE 290 MCG PO CAPS: 290.0000 ug | ORAL_CAPSULE | Freq: Every day | ORAL | 5 refills | Status: AC

## 2023-04-19 NOTE — Telephone Encounter (Signed)
The patient called back about her medication refill.

## 2023-04-19 NOTE — Telephone Encounter (Signed)
Patient requesting refill on Linzess 290. Sent to pharmacy,

## 2023-04-19 NOTE — Telephone Encounter (Signed)
The patient call left a voicemail requesting a refill on her (Linzess) 290 MG. She wants to know if she needs to call her pharmacy, or we need to call her pharmacy. I called the patient back to let know that we received her message, the patient pharmacy is [facility] on [address], [place], [place] [address].

## 2023-04-20 ENCOUNTER — Telehealth: Payer: Self-pay | Admitting: Family Medicine

## 2023-04-20 ENCOUNTER — Encounter: Payer: Self-pay | Admitting: Diagnostic Neuroimaging

## 2023-04-20 ENCOUNTER — Telehealth: Payer: Self-pay

## 2023-04-20 NOTE — Telephone Encounter (Signed)
Patient was informed orders are in and she can go at her convenience but [facility] does not offer labs so she will have to go to walk in if not here.

## 2023-04-20 NOTE — Telephone Encounter (Signed)
Lab is ordered.  She can go to [address] to have her labs drawn

## 2023-04-20 NOTE — Telephone Encounter (Signed)
Copied from CRM [id number]. Topic: Clinical - Request for Lab/Test Order >> Apr 20, 2023  1:37 PM Orinda Kenner C wrote: Reason for CRM: Patient would like to do blood work at the [facility] instead of coming to the office. Can patient get a lab order. Please advise and call back at [phone].

## 2023-04-20 NOTE — Telephone Encounter (Signed)
Called pt left message for her to try and make appt herself if not successful then call the office and I would try and assist her.

## 2023-04-21 ENCOUNTER — Telehealth: Payer: Self-pay

## 2023-04-21 NOTE — Telephone Encounter (Signed)
Copied from CRM [id number]. Topic: General - Other >> Apr 21, 2023 11:24 AM Elizebeth Brooking wrote: Reason for CRM: Patient called in regarding a miss call from [facility] , Is requesting a callback

## 2023-04-22 NOTE — Telephone Encounter (Signed)
See phone note. Spoke with pt.

## 2023-04-25 ENCOUNTER — Ambulatory Visit: Payer: Self-pay | Admitting: Family Medicine

## 2023-04-25 ENCOUNTER — Telehealth: Payer: Self-pay | Admitting: Family Medicine

## 2023-04-25 NOTE — Telephone Encounter (Signed)
Copied from CRM [id number]. Topic: Clinical - Medication Question >> Apr 25, 2023 11:21 AM Samuel Jester B wrote: Reason for CRM: Pt stated that her provider wanted her to do another panel because her results were elevated and would like to know can she go to the [facility] corp located at [facility] [address]  instead of coming into the office.

## 2023-04-25 NOTE — Telephone Encounter (Signed)
Pot was advise this referral was sent. Pt is going to call back and make appt. Pt was under impression that the referral [facility]) would not prescribe medication. (She was told this over the phone)

## 2023-04-25 NOTE — Telephone Encounter (Signed)
Spoke with pt and she was advised to call [facility] location to schedule lab visit . Pt was advised [facility] does not have a lab

## 2023-04-25 NOTE — Telephone Encounter (Signed)
Copied from CRM [id number]. Topic: Referral - Question >> Apr 25, 2023 11:14 AM Theresa Martin wrote: Reason for CRM: Pt stated that she would like a callback to see she can get a referral for talk therapy an medication management with the same specialist. She said she was provided a referral but tried the number and they informed her it was for talk therapy only. She prefer to be in a [place] office, but if she goes to [facility] office is it going to be the same providers from [employer].

## 2023-04-27 ENCOUNTER — Ambulatory Visit: Payer: Medicare Other | Admitting: Obstetrics

## 2023-04-27 NOTE — Progress Notes (Deleted)
GYNECOLOGY: ANNUAL EXAM   Subjective:    PCP: Sheliah Hatch, MD Nature Kueker Knechtel is a 68 y.o. female [phone] who presents for annual wellness visit.   Well Woman Visit:  GYN HISTORY:  No LMP recorded. Patient is postmenopausal.     Menstrual History: OB History     Gravida  3   Para  2   Term  2   Preterm      AB  1   Living  2      SAB  1   IAB      Ectopic      Multiple      Live Births  2        Obstetric Comments  1st Menstrual Cycle:  11 1st Pregnancy:  87          Menarche age: *** No LMP recorded. Patient is postmenopausal.     Periods are every *** days, and last *** days, flow is light / moderate / heavy.  Uses pads / tampons / menstrual cup and changes it every *** hours.  Cramping is mild / moderate / severe.  Cyclic symptoms include: {symptoms; gyn cyclic:13153}.  Intermenstrual bleeding, spotting, or discharge? *** Urinary incontinence? ***  Sexually active: *** Number of sexual partners: *** Gender of sexual Partners: *** Social History   Substance and Sexual Activity  Sexual Activity Not Currently   Contraceptive methods: {PLAN CONTRACEPTION:313102} Dyspareunia? *** STI history: *** STI/HIV testing or immunizations needed? {yes MW:102725}   Health Maintenance: -Last pap: {findings; last pap:13140}  --> Any abnormals: *** -Last mammogram: 09/16/22 --> Any abnormals? no -Last colon cancer screen: 09/02/22 / Type: colonoscopy -Last DEXA scan: 04/25/20 -FM[facility] of Breast / Colon / Cervical cancer: no -Vaccines:  Immunization History  Administered Date(s) Administered   Fluad Quad(high Dose 65+) 03/04/2020, 03/26/2022   Influenza,inj,Quad PF,6+ Mos 01/19/2017, 01/26/2019   Influenza-Unspecified 12/21/2020   Moderna Sars-Covid-2 Vaccination 09/03/2019, 09/24/2019, 02/16/2020, 02/05/2021   Pneumococcal Conjugate-13 01/17/2020   Pneumococcal Polysaccharide-23 12/25/2018   Tdap 07/27/2012   Zoster  Recombinant(Shingrix) 05/04/2018, 10/26/2018   Zoster, Live 09/26/2014   Last Tdap: utd / Flu: *** / COVID: utd / Gardasil: ageout / Shingles (50+): utd / PCV20: utd -Hep C screen: utd -Last lipid / glucose screening: utd  > Exercise: {misc; exercise types:16438}, {exercise level:31265} > Dietary Supplements: Folate: {yes/no:20286};  Calcium: {yes/no:20286}}; Vitamin D: {yes/no:20286} > There is no height or weight on file to calculate BMI.  > Recent dental visit {yes no:314532} > Seat Belt Use: {yes no:314532} > Texting and driving? {yes no:314532} > Guns in the house {yes no:314532} > Recreational or other drug use: {Drug Use:32241}   Social History   Tobacco Use   Smoking status: Never   Smokeless tobacco: Never  Substance Use Topics   Alcohol use: No    Alcohol/week: 0.0 standard drinks of alcohol   Occupation: retired    Lives with: ***    PHQ-2 Score: In last two weeks, how often have you felt: Little interest or pleasure in doing things: {PHQGADfrequency:29690::"Not at all (0)"} Feeling down, depressed or hopeless: {PHQGADfrequency:29690::"Not at all (0)"} Score:   GAD-2 Over the last 2 weeks, how often have you been bothered by the following problems? Feeling nervous, anxious or on edge: {PHQGADfrequency:29690::"Not at all (0)"} Not being able to stop or control worrying: {PHQGADfrequency:29690::"Not at all (0)"}} Score: _________________________________________________________  Current Outpatient Medications  Medication Sig Dispense Refill   acetaminophen (TYLENOL) 325 MG tablet 1  tablet as needed Orally every 6 hrs     acyclovir (ZOVIRAX) 800 MG tablet Take 800 mg by mouth 3 (three) times daily.     atorvastatin (LIPITOR) 20 MG tablet TAKE 1 TABLET BY MOUTH EVERY DAY 90 tablet 1   budesonide-formoterol (SYMBICORT) 160-4.5 MCG/ACT inhaler 1 puff as needed Inhalation every 4 hrs     buPROPion (WELLBUTRIN) 100 MG tablet Take 100 mg by mouth daily at 2 PM.      Cholecalciferol (VITAMIN D3 PO) Take 5,000 Units by mouth.     clonazePAM (KLONOPIN) 0.5 MG tablet Take 0.5 mg by mouth daily as needed.     diclofenac Sodium (VOLTAREN) 1 % GEL Apply 1 Application topically 4 (four) times daily as needed.     ELDERBERRY PO Take by mouth.     eletriptan (RELPAX) 40 MG tablet 1 tablet Orally Once a day     escitalopram (LEXAPRO) 10 MG tablet 1 tablet Orally Once a day     famotidine (PEPCID) 20 MG tablet Take 20 mg by mouth 2 (two) times daily.     Fremanezumab-vfrm (AJOVY) 225 MG/1.5ML SOAJ Inject 225 mg into the skin every 30 (thirty) days. 4.5 mL 4   Lactulose 20 GM/30ML SOLN Take 30 mLs (20 g total) by mouth 3 (three) times daily as needed. 900 mL 5   latanoprost (XALATAN) 0.005 % ophthalmic solution Place 1 drop into both eyes at bedtime. 2.5 mL 12   linaclotide (LINZESS) 290 MCG CAPS capsule Take 1 capsule (290 mcg total) by mouth daily before breakfast. 30 capsule 5   meloxicam (MOBIC) 15 MG tablet 1 tablet Orally Once a day     methylPREDNISolone (MEDROL) 4 MG tablet 1 tablet with food or milk Orally every 12 hrs     Misc Natural Products (JOINT HEALTH PO) Take by mouth.     montelukast (SINGULAIR) 10 MG tablet 1 tablet Orally Once a day     Multiple Vitamins-Minerals (WOMENS MULTIVITAMIN PO) Take by mouth.     pantoprazole (PROTONIX) 40 MG tablet Take 1 tablet (40 mg total) by mouth daily. 30 tablet 5   Probiotic Product (PROBIOTIC PO) Take by mouth.     QUEtiapine (SEROQUEL XR) 400 MG 24 hr tablet Take 1 tablet (400 mg total) by mouth at bedtime. (Patient taking differently: Take 800 mg by mouth at bedtime.) 30 tablet 1   Rimegepant Sulfate (NURTEC) 75 MG TBDP Take 1 tablet (75 mg total) by mouth as needed (for breakthrough migraine). 8 tablet 12   rizatriptan (MAXALT-MLT) 10 MG disintegrating tablet Take 1 tablet (10 mg total) by mouth as needed for migraine (May repeat in 2 hours if needed). 10 tablet 12   Sennosides (SENOKOT PO) Take 20 mg by mouth  in the morning and at bedtime.     temazepam (RESTORIL) 15 MG capsule Take 1 capsule (15 mg total) by mouth at bedtime. 30 capsule 0   triamterene-hydrochlorothiazide (MAXZIDE-25) 37.5-25 MG tablet TAKE 1 TABLET BY MOUTH EVERY DAY 90 tablet 1   Turmeric (QC TUMERIC COMPLEX) 500 MG CAPS Take by mouth.     UNABLE TO FIND Take 2 tablets by mouth at bedtime. Med Name: magnesium L theronate  650 mg at HS     zinc gluconate 50 MG tablet Take 50 mg by mouth daily.     No current facility-administered medications for this visit.   No Known Allergies  Past Medical History:  Diagnosis Date   Allergy    Anxiety  Asthma    Depression    Hyperlipidemia    Hypertension    Migraines    Osteopenia after menopause    Persistent headaches    Pneumonia 2020   Postherpetic neuralgia    Renal disorder    Shingles outbreak 02/2014   Skin cancer    moles on right groin and right buttock.   Slurred speech 09/09/2022   Past Surgical History:  Procedure Laterality Date   CARDIOVASCULAR STRESS TEST  02/2009   treadmill stress test: Low risk   CESAREAN SECTION  1987, 1989   COLONOSCOPY     2014/2015 [facility], normal   COLONOSCOPY WITH PROPOFOL N/A 10/20/2021   Procedure: COLONOSCOPY WITH PROPOFOL;  Surgeon: Wyline Mood, MD;  Location: [facility] ENDOSCOPY;  Service: Gastroenterology;  Laterality: N/A;   COLONOSCOPY WITH PROPOFOL N/A 09/02/2022   Procedure: COLONOSCOPY WITH PROPOFOL;  Surgeon: Toney Reil, MD;  Location: [facility] ENDOSCOPY;  Service: Gastroenterology;  Laterality: N/A;  Requests about 11:15 arrival   DIAGNOSTIC LAPAROSCOPY     ESOPHAGOGASTRODUODENOSCOPY (EGD) WITH PROPOFOL N/A 09/02/2022   Procedure: ESOPHAGOGASTRODUODENOSCOPY (EGD) WITH PROPOFOL;  Surgeon: Toney Reil, MD;  Location: [facility] ENDOSCOPY;  Service: Gastroenterology;  Laterality: N/A;   EXPLORATORY LAPAROTOMY     laproscopy for infertility   EYE SURGERY Bilateral    laser-correct opening b/tn cornea and iris   MOLE  REMOVAL  2017   x 2 moles    Review Of Systems  Constitutional: Denied constitutional symptoms, night sweats, recent illness, fatigue, fever, insomnia and weight loss.  Eyes: Denied eye symptoms, eye pain, photophobia, vision change and visual disturbance.  Ears/Nose/Throat/Neck: Denied ear, nose, throat or neck symptoms, hearing loss, nasal discharge, sinus congestion and sore throat.  Cardiovascular: Denied cardiovascular symptoms, arrhythmia, chest pain/pressure, edema, exercise intolerance, orthopnea and palpitations.  Respiratory: Denied pulmonary symptoms, asthma, pleuritic pain, productive sputum, cough, dyspnea and wheezing.  Gastrointestinal: Denied, gastro-esophageal reflux, melena, nausea and vomiting.  Genitourinary:*** Denied genitourinary symptoms including symptomatic vaginal discharge, pelvic relaxation issues, and urinary complaints.  Musculoskeletal: Denied musculoskeletal symptoms, stiffness, swelling, muscle weakness and myalgia.  Dermatologic: Denied dermatology symptoms, rash and scar.  Neurologic: Denied neurology symptoms, dizziness, headache, neck pain and syncope.  Psychiatric: Denied psychiatric symptoms, anxiety and depression.  Endocrine: Denied endocrine symptoms including hot flashes and night sweats.      Objective:    There were no vitals taken for this visit.  Constitutional: Well-developed, well-nourished female in no acute distress Neurological: Alert and oriented to person, place, and time Psychiatric: Mood and affect appropriate Skin: No rashes or lesions Neck: Supple without masses. Trachea is midline.Thyroid is normal size without masses Lymphatics: No cervical, axillary, supraclavicular, or inguinal adenopathy noted Respiratory: Clear to auscultation bilaterally. Good air movement with normal work of breathing. Cardiovascular: Regular rate and rhythm. Extremities grossly normal, nontender with no edema; pulses regular Gastrointestinal: Soft,  nontender, nondistended. No masses or hernias appreciated. No hepatosplenomegaly. No fluid wave. No rebound or guarding. Breast Exam: {Exam; breast:13139::"normal appearance, no masses or tenderness"} Genitourinary:         External Genitalia: Normal female genitalia    Vagina: Normal mucosa, no lesions.    Cervix: No lesions, normal size and consistency; no cervical motion tenderness; non-friable; Pap not***obtained.    Uterus: Normal size and contour; smooth, mobile, NT, {Desc; anteverted/retroverted/midposition:60613}. Adnexae: Non-palpable and non-tender Perineum/Anus: No lesions Rectal: deferred    Assessment/Plan:    BRENDI MCCARROLL is a 68 y.o. female [id number] with normal well-woman gynecologic exam.  -  Screenings:  Pap: done with cotesting today  *** w/rflx today Mammogram: ordered***due *** Colon: ordered colonoscopy***Cologuard -OR- due *** Labs: ***A1C, CMP, HepC, Lipid panel, Vit D, TSH GAD***PHQ-2 = ***, discussed coping techniques; follow up with PCP if worsens or develops concern -Contraception: *** -Vaccines: UTD, Tdap today; pt declines Influenza. Gardasil: series not started, declined today.  -Healthy lifestyle modifications discussed: multivitamin, diet, exercise, sunscreen, tobacco and alcohol use. Emphasized importance of regular physical activity.  -Folate***Calcium and Vit D recommendation reviewed.  -All questions answered to patient's satisfaction.  -RTC 1 yr for annual, sooner prn.   No follow-ups on file.    Julieanne Manson, DO [facility]

## 2023-04-27 NOTE — Patient Instructions (Incomplete)
Preventive Care 87 Years and Older, Female Preventive care refers to lifestyle choices and visits with your health care provider that can promote health and wellness. Preventive care visits are also called wellness exams. What can I expect for my preventive care visit? Counseling Your health care provider may ask you questions about your: Medical history, including: Past medical problems. Family medical history. Pregnancy and menstrual history. History of falls. Current health, including: Memory and ability to understand (cognition). Emotional well-being. Home life and relationship well-being. Sexual activity and sexual health. Lifestyle, including: Alcohol, nicotine or tobacco, and drug use. Access to firearms. Diet, exercise, and sleep habits. Work and work Astronomer. Sunscreen use. Safety issues such as seatbelt and bike helmet use. Physical exam Your health care provider will check your: Height and weight. These may be used to calculate your BMI (body mass index). BMI is a measurement that tells if you are at a healthy weight. Waist circumference. This measures the distance around your waistline. This measurement also tells if you are at a healthy weight and may help predict your risk of certain diseases, such as type 2 diabetes and high blood pressure. Heart rate and blood pressure. Body temperature. Skin for abnormal spots. What immunizations do I need?  Vaccines are usually given at various ages, according to a schedule. Your health care provider will recommend vaccines for you based on your age, medical history, and lifestyle or other factors, such as travel or where you work. What tests do I need? Screening Your health care provider may recommend screening tests for certain conditions. This may include: Lipid and cholesterol levels. Hepatitis C test. Hepatitis B test. HIV (human immunodeficiency virus) test. STI (sexually transmitted infection) testing, if you are at  risk. Lung cancer screening. Colorectal cancer screening. Diabetes screening. This is done by checking your blood sugar (glucose) after you have not eaten for a while (fasting). Mammogram. Talk with your health care provider about how often you should have regular mammograms. BRCA-related cancer screening. This may be done if you have a family history of breast, ovarian, tubal, or peritoneal cancers. Bone density scan. This is done to screen for osteoporosis. Talk with your health care provider about your test results, treatment options, and if necessary, the need for more tests. Follow these instructions at home: Eating and drinking  Eat a diet that includes fresh fruits and vegetables, whole grains, lean protein, and low-fat dairy products. Limit your intake of foods with high amounts of sugar, saturated fats, and salt. Take vitamin and mineral supplements as recommended by your health care provider. Do not drink alcohol if your health care provider tells you not to drink. If you drink alcohol: Limit how much you have to 0-1 drink a day. Know how much alcohol is in your drink. In the [place] one drink equals one 12 oz bottle of beer (355 mL), one 5 oz glass of wine (148 mL), or one 1 oz glass of hard liquor (44 mL). Lifestyle Brush your teeth every morning and night with fluoride toothpaste. Floss one time each day. Exercise for at least 30 minutes 5 or more days each week. Do not use any products that contain nicotine or tobacco. These products include cigarettes, chewing tobacco, and vaping devices, such as e-cigarettes. If you need help quitting, ask your health care provider. Do not use drugs. If you are sexually active, practice safe sex. Use a condom or other form of protection in order to prevent STIs. Take aspirin only as told by  your health care provider. Make sure that you understand how much to take and what form to take. Work with your health care provider to find out whether it  is safe and beneficial for you to take aspirin daily. Ask your health care provider if you need to take a cholesterol-lowering medicine (statin). Find healthy ways to manage stress, such as: Meditation, yoga, or listening to music. Journaling. Talking to a trusted person. Spending time with friends and family. Minimize exposure to UV radiation to reduce your risk of skin cancer. Safety Always wear your seat belt while driving or riding in a vehicle. Do not drive: If you have been drinking alcohol. Do not ride with someone who has been drinking. When you are tired or distracted. While texting. If you have been using any mind-altering substances or drugs. Wear a helmet and other protective equipment during sports activities. If you have firearms in your house, make sure you follow all gun safety procedures. What's next? Visit your health care provider once a year for an annual wellness visit. Ask your health care provider how often you should have your eyes and teeth checked. Stay up to date on all vaccines. This information is not intended to replace advice given to you by your health care provider. Make sure you discuss any questions you have with your health care provider. Document Revised: 09/10/2020 Document Reviewed: 09/10/2020 Elsevier Patient Education  2024 [employer].

## 2023-04-28 DIAGNOSIS — F3132 Bipolar disorder, current episode depressed, moderate: Secondary | ICD-10-CM | POA: Diagnosis not present

## 2023-04-28 DIAGNOSIS — F411 Generalized anxiety disorder: Secondary | ICD-10-CM | POA: Diagnosis not present

## 2023-04-29 ENCOUNTER — Telehealth: Payer: Self-pay

## 2023-04-29 NOTE — Telephone Encounter (Signed)
Copied from CRM [id number]. Topic: Clinical - Request for Lab/Test Order >> Apr 29, 2023  1:26 PM Denese Killings wrote: Reason for CRM: Patient needs the doctor to call in an order for CVC with DIFF lab to [facility] ([address] [place]) [phone] on today. Patient is requesting a call when it is there.

## 2023-04-29 NOTE — Addendum Note (Signed)
Addended by: Eldred Manges on: 04/29/2023 01:46 PM   Modules accepted: Orders

## 2023-04-29 NOTE — Telephone Encounter (Signed)
Got labs reordered for [facility] and have now faxed them to the requested location   Attempted call back to patient to inform her this has been completed but phone number is not in service

## 2023-04-29 NOTE — Addendum Note (Signed)
Addended by: Eldred Manges on: 04/29/2023 01:43 PM   Modules accepted: Orders

## 2023-04-29 NOTE — Telephone Encounter (Signed)
Patient appears to be requesting order for a CBC w/ Diff to be done at [facility] [place].   CBC w/ diff is already ordered for future and requested to be repeated about this time. I will fax the order as requested and let the patient know

## 2023-05-02 ENCOUNTER — Ambulatory Visit: Payer: Medicare Other

## 2023-05-03 ENCOUNTER — Telehealth: Payer: Self-pay

## 2023-05-03 NOTE — Telephone Encounter (Signed)
Copied from CRM [id number]. Topic: General - Other >> May 03, 2023  1:32 PM Kathryne Eriksson wrote: Reason for CRM: Missed Office Call >> May 03, 2023  1:34 PM Kathryne Eriksson wrote: Patient call back number [phone]. Patient returning a missed office call

## 2023-05-04 ENCOUNTER — Telehealth: Payer: Self-pay | Admitting: Family Medicine

## 2023-05-04 NOTE — Telephone Encounter (Signed)
 This was already completed 04/29/2023 and sent to [facility] called patient let her know

## 2023-05-04 NOTE — Telephone Encounter (Signed)
 Copied from CRM [id number]. Topic: Appointments - Scheduling Inquiry for Clinic >> May 04, 2023 10:08 AM Leotis ORN wrote: Reason for CRM: please advise, Patient calling in regards to there future lab orders CBC with Differential/Platelet (Order [id number])  she is wanting to know if this can be sent and done at the [facility] of [facility] since it is closer to her. Patient would like a callback at [phone]

## 2023-05-05 ENCOUNTER — Other Ambulatory Visit: Payer: Medicare Other

## 2023-05-09 DIAGNOSIS — F5104 Psychophysiologic insomnia: Secondary | ICD-10-CM | POA: Diagnosis not present

## 2023-05-09 DIAGNOSIS — F411 Generalized anxiety disorder: Secondary | ICD-10-CM | POA: Diagnosis not present

## 2023-05-09 DIAGNOSIS — F3132 Bipolar disorder, current episode depressed, moderate: Secondary | ICD-10-CM | POA: Diagnosis not present

## 2023-05-10 NOTE — Telephone Encounter (Signed)
Copied from CRM [id number]. Topic: General - Other >> May 10, 2023  2:06 PM Fredrich Romans wrote: Reason for CRM: [facility] called [facility] and they said that they have not received the lab order.Order was suppose to [facility] on [address]  Fax number:[phone]

## 2023-05-16 NOTE — Progress Notes (Deleted)
 GYNECOLOGY: ANNUAL EXAM   Subjective:    PCP: Sheliah Hatch, MD Theresa Martin is a 68 y.o. female [phone] who presents for annual wellness visit.   Well Woman Visit:  GYN HISTORY:  No LMP recorded. Patient is postmenopausal.     Menstrual History: OB History     Gravida  3   Para  2   Term  2   Preterm      AB  1   Living  2      SAB  1   IAB      Ectopic      Multiple      Live Births  2        Obstetric Comments  1st Menstrual Cycle:  11 1st Pregnancy:  87          Menarche age: *** No LMP recorded. Patient is postmenopausal.     Periods are every *** days, and last *** days, flow is light / moderate / heavy.  Uses pads / tampons / menstrual cup and changes it every *** hours.  Cramping is mild / moderate / severe.  Cyclic symptoms include: {symptoms; gyn cyclic:13153}.  Intermenstrual bleeding, spotting, or discharge? *** Urinary incontinence? ***  Sexually active: *** Number of sexual partners: *** Gender of sexual Partners: *** Social History   Substance and Sexual Activity  Sexual Activity Not Currently   Contraceptive methods: {PLAN CONTRACEPTION:313102} Dyspareunia? *** STI history: *** STI/HIV testing or immunizations needed? {yes MW:102725}   Health Maintenance: -Last pap: {findings; last pap:13140}  --> Any abnormals: *** -Last mammogram: 09/16/22 --> Any abnormals? no -Last colon cancer screen: 09/02/22 / Type: colonoscopy -Last DEXA scan: 04/25/20 -FMH of Breast / Colon / Cervical cancer: no -Vaccines:  Immunization History  Administered Date(s) Administered   Fluad Quad(high Dose 65+) 03/04/2020, 03/26/2022   Influenza,inj,Quad PF,6+ Mos 01/19/2017, 01/26/2019   Influenza-Unspecified 12/21/2020   Moderna Sars-Covid-2 Vaccination 09/03/2019, 09/24/2019, 02/16/2020, 02/05/2021   Pneumococcal Conjugate-13 01/17/2020   Pneumococcal Polysaccharide-23 12/25/2018   Tdap 07/27/2012   Zoster  Recombinant(Shingrix) 05/04/2018, 10/26/2018   Zoster, Live 09/26/2014   Last Tdap: utd / Flu: *** / COVID: utd / Gardasil: ageout / Shingles (50+): utd / PCV20: utd -Hep C screen: utd -Last lipid / glucose screening: utd  > Exercise: {misc; exercise types:16438}, {exercise level:31265} > Dietary Supplements: Folate: {yes/no:20286};  Calcium: {yes/no:20286}}; Vitamin D: {yes/no:20286} > There is no height or weight on file to calculate BMI.  > Recent dental visit {yes no:314532} > Seat Belt Use: {yes no:314532} > Texting and driving? {yes no:314532} > Guns in the house {yes no:314532} > Recreational or other drug use: {Drug Use:32241}   Social History   Tobacco Use   Smoking status: Never   Smokeless tobacco: Never  Substance Use Topics   Alcohol use: No    Alcohol/week: 0.0 standard drinks of alcohol   Occupation: retired    Lives with: ***    PHQ-2 Score: In last two weeks, how often have you felt: Little interest or pleasure in doing things: {PHQGADfrequency:29690::"Not at all (0)"} Feeling down, depressed or hopeless: {PHQGADfrequency:29690::"Not at all (0)"} Score:   GAD-2 Over the last 2 weeks, how often have you been bothered by the following problems? Feeling nervous, anxious or on edge: {PHQGADfrequency:29690::"Not at all (0)"} Not being able to stop or control worrying: {PHQGADfrequency:29690::"Not at all (0)"}} Score: _________________________________________________________  Current Outpatient Medications  Medication Sig Dispense Refill   acetaminophen (TYLENOL) 325 MG tablet 1  tablet as needed Orally every 6 hrs     acyclovir (ZOVIRAX) 800 MG tablet Take 800 mg by mouth 3 (three) times daily.     atorvastatin (LIPITOR) 20 MG tablet TAKE 1 TABLET BY MOUTH EVERY DAY 90 tablet 1   budesonide-formoterol (SYMBICORT) 160-4.5 MCG/ACT inhaler 1 puff as needed Inhalation every 4 hrs     buPROPion (WELLBUTRIN) 100 MG tablet Take 100 mg by mouth daily at 2 PM.      Cholecalciferol (VITAMIN D3 PO) Take 5,000 Units by mouth.     clonazePAM (KLONOPIN) 0.5 MG tablet Take 0.5 mg by mouth daily as needed.     diclofenac Sodium (VOLTAREN) 1 % GEL Apply 1 Application topically 4 (four) times daily as needed.     ELDERBERRY PO Take by mouth.     eletriptan (RELPAX) 40 MG tablet 1 tablet Orally Once a day     escitalopram (LEXAPRO) 10 MG tablet 1 tablet Orally Once a day     famotidine (PEPCID) 20 MG tablet Take 20 mg by mouth 2 (two) times daily.     Fremanezumab-vfrm (AJOVY) 225 MG/1.5ML SOAJ Inject 225 mg into the skin every 30 (thirty) days. 4.5 mL 4   Lactulose 20 GM/30ML SOLN Take 30 mLs (20 g total) by mouth 3 (three) times daily as needed. 900 mL 5   latanoprost (XALATAN) 0.005 % ophthalmic solution Place 1 drop into both eyes at bedtime. 2.5 mL 12   linaclotide (LINZESS) 290 MCG CAPS capsule Take 1 capsule (290 mcg total) by mouth daily before breakfast. 30 capsule 5   meloxicam (MOBIC) 15 MG tablet 1 tablet Orally Once a day     methylPREDNISolone (MEDROL) 4 MG tablet 1 tablet with food or milk Orally every 12 hrs     Misc Natural Products (JOINT HEALTH PO) Take by mouth.     montelukast (SINGULAIR) 10 MG tablet 1 tablet Orally Once a day     Multiple Vitamins-Minerals (WOMENS MULTIVITAMIN PO) Take by mouth.     pantoprazole (PROTONIX) 40 MG tablet Take 1 tablet (40 mg total) by mouth daily. 30 tablet 5   Probiotic Product (PROBIOTIC PO) Take by mouth.     QUEtiapine (SEROQUEL XR) 400 MG 24 hr tablet Take 1 tablet (400 mg total) by mouth at bedtime. (Patient taking differently: Take 800 mg by mouth at bedtime.) 30 tablet 1   Rimegepant Sulfate (NURTEC) 75 MG TBDP Take 1 tablet (75 mg total) by mouth as needed (for breakthrough migraine). 8 tablet 12   rizatriptan (MAXALT-MLT) 10 MG disintegrating tablet Take 1 tablet (10 mg total) by mouth as needed for migraine (May repeat in 2 hours if needed). 10 tablet 12   Sennosides (SENOKOT PO) Take 20 mg by mouth  in the morning and at bedtime.     temazepam (RESTORIL) 15 MG capsule Take 1 capsule (15 mg total) by mouth at bedtime. 30 capsule 0   triamterene-hydrochlorothiazide (MAXZIDE-25) 37.5-25 MG tablet TAKE 1 TABLET BY MOUTH EVERY DAY 90 tablet 1   Turmeric (QC TUMERIC COMPLEX) 500 MG CAPS Take by mouth.     UNABLE TO FIND Take 2 tablets by mouth at bedtime. Med Name: magnesium L theronate  650 mg at HS     zinc gluconate 50 MG tablet Take 50 mg by mouth daily.     No current facility-administered medications for this visit.   No Known Allergies  Past Medical History:  Diagnosis Date   Allergy    Anxiety  Asthma    Depression    Hyperlipidemia    Hypertension    Migraines    Osteopenia after menopause    Persistent headaches    Pneumonia 2020   Postherpetic neuralgia    Renal disorder    Shingles outbreak 02/2014   Skin cancer    moles on right groin and right buttock.   Slurred speech 09/09/2022   Past Surgical History:  Procedure Laterality Date   CARDIOVASCULAR STRESS TEST  02/2009   treadmill stress test: Low risk   CESAREAN SECTION  1987, 1989   COLONOSCOPY     2014/2015 [facility], normal   COLONOSCOPY WITH PROPOFOL N/A 10/20/2021   Procedure: COLONOSCOPY WITH PROPOFOL;  Surgeon: Wyline Mood, MD;  Location: [facility] ENDOSCOPY;  Service: Gastroenterology;  Laterality: N/A;   COLONOSCOPY WITH PROPOFOL N/A 09/02/2022   Procedure: COLONOSCOPY WITH PROPOFOL;  Surgeon: Toney Reil, MD;  Location: [facility] ENDOSCOPY;  Service: Gastroenterology;  Laterality: N/A;  Requests about 11:15 arrival   DIAGNOSTIC LAPAROSCOPY     ESOPHAGOGASTRODUODENOSCOPY (EGD) WITH PROPOFOL N/A 09/02/2022   Procedure: ESOPHAGOGASTRODUODENOSCOPY (EGD) WITH PROPOFOL;  Surgeon: Toney Reil, MD;  Location: [facility] ENDOSCOPY;  Service: Gastroenterology;  Laterality: N/A;   EXPLORATORY LAPAROTOMY     laproscopy for infertility   EYE SURGERY Bilateral    laser-correct opening b/tn cornea and iris   MOLE  REMOVAL  2017   x 2 moles    Review Of Systems  Constitutional: Denied constitutional symptoms, night sweats, recent illness, fatigue, fever, insomnia and weight loss.  Eyes: Denied eye symptoms, eye pain, photophobia, vision change and visual disturbance.  Ears/Nose/Throat/Neck: Denied ear, nose, throat or neck symptoms, hearing loss, nasal discharge, sinus congestion and sore throat.  Cardiovascular: Denied cardiovascular symptoms, arrhythmia, chest pain/pressure, edema, exercise intolerance, orthopnea and palpitations.  Respiratory: Denied pulmonary symptoms, asthma, pleuritic pain, productive sputum, cough, dyspnea and wheezing.  Gastrointestinal: Denied, gastro-esophageal reflux, melena, nausea and vomiting.  Genitourinary:*** Denied genitourinary symptoms including symptomatic vaginal discharge, pelvic relaxation issues, and urinary complaints.  Musculoskeletal: Denied musculoskeletal symptoms, stiffness, swelling, muscle weakness and myalgia.  Dermatologic: Denied dermatology symptoms, rash and scar.  Neurologic: Denied neurology symptoms, dizziness, headache, neck pain and syncope.  Psychiatric: Denied psychiatric symptoms, anxiety and depression.  Endocrine: Denied endocrine symptoms including hot flashes and night sweats.      Objective:    There were no vitals taken for this visit.  Constitutional: Well-developed, well-nourished female in no acute distress Neurological: Alert and oriented to person, place, and time Psychiatric: Mood and affect appropriate Skin: No rashes or lesions Neck: Supple without masses. Trachea is midline.Thyroid is normal size without masses Lymphatics: No cervical, axillary, supraclavicular, or inguinal adenopathy noted Respiratory: Clear to auscultation bilaterally. Good air movement with normal work of breathing. Cardiovascular: Regular rate and rhythm. Extremities grossly normal, nontender with no edema; pulses regular Gastrointestinal: Soft,  nontender, nondistended. No masses or hernias appreciated. No hepatosplenomegaly. No fluid wave. No rebound or guarding. Breast Exam: {Exam; breast:13139::"normal appearance, no masses or tenderness"} Genitourinary:         External Genitalia: Normal female genitalia    Vagina: Normal mucosa, no lesions.    Cervix: No lesions, normal size and consistency; no cervical motion tenderness; non-friable; Pap not***obtained.    Uterus: Normal size and contour; smooth, mobile, NT, {Desc; anteverted/retroverted/midposition:60613}. Adnexae: Non-palpable and non-tender Perineum/Anus: No lesions Rectal: deferred    Assessment/Plan:    Theresa Martin is a 68 y.o. female [id number] with normal well-woman gynecologic exam.  -  Screenings:  Pap: done with cotesting today  *** w/rflx today Mammogram: ordered***due *** Colon: ordered colonoscopy***Cologuard -OR- due *** Labs: ***A1C, CMP, HepC, Lipid panel, Vit D, TSH GAD***PHQ-2 = ***, discussed coping techniques; follow up with PCP if worsens or develops concern -Contraception: *** -Vaccines: UTD, Tdap today; pt declines Influenza. Gardasil: series not started, declined today.  -Healthy lifestyle modifications discussed: multivitamin, diet, exercise, sunscreen, tobacco and alcohol use. Emphasized importance of regular physical activity.  -Folate***Calcium and Vit D recommendation reviewed.  -All questions answered to patient's satisfaction.  -RTC 1 yr for annual, sooner prn.   No follow-ups on file.    Julieanne Manson, DO [facility] at [facility]

## 2023-05-16 NOTE — Patient Instructions (Incomplete)
 Preventive Care 43 Years and Older, Female Preventive care refers to lifestyle choices and visits with your health care provider that can promote health and wellness. Preventive care visits are also called wellness exams. What can I expect for my preventive care visit? Counseling Your health care provider may ask you questions about your: Medical history, including: Past medical problems. Family medical history. Pregnancy and menstrual history. History of falls. Current health, including: Memory and ability to understand (cognition). Emotional well-being. Home life and relationship well-being. Sexual activity and sexual health. Lifestyle, including: Alcohol, nicotine or tobacco, and drug use. Access to firearms. Diet, exercise, and sleep habits. Work and work Astronomer. Sunscreen use. Safety issues such as seatbelt and bike helmet use. Physical exam Your health care provider will check your: Height and weight. These may be used to calculate your BMI (body mass index). BMI is a measurement that tells if you are at a healthy weight. Waist circumference. This measures the distance around your waistline. This measurement also tells if you are at a healthy weight and may help predict your risk of certain diseases, such as type 2 diabetes and high blood pressure. Heart rate and blood pressure. Body temperature. Skin for abnormal spots. What immunizations do I need?  Vaccines are usually given at various ages, according to a schedule. Your health care provider will recommend vaccines for you based on your age, medical history, and lifestyle or other factors, such as travel or where you work. What tests do I need? Screening Your health care provider may recommend screening tests for certain conditions. This may include: Lipid and cholesterol levels. Hepatitis C test. Hepatitis B test. HIV (human immunodeficiency virus) test. STI (sexually transmitted infection) testing, if you are at  risk. Lung cancer screening. Colorectal cancer screening. Diabetes screening. This is done by checking your blood sugar (glucose) after you have not eaten for a while (fasting). Mammogram. Talk with your health care provider about how often you should have regular mammograms. BRCA-related cancer screening. This may be done if you have a family history of breast, ovarian, tubal, or peritoneal cancers. Bone density scan. This is done to screen for osteoporosis. Talk with your health care provider about your test results, treatment options, and if necessary, the need for more tests. Follow these instructions at home: Eating and drinking  Eat a diet that includes fresh fruits and vegetables, whole grains, lean protein, and low-fat dairy products. Limit your intake of foods with high amounts of sugar, saturated fats, and salt. Take vitamin and mineral supplements as recommended by your health care provider. Do not drink alcohol if your health care provider tells you not to drink. If you drink alcohol: Limit how much you have to 0-1 drink a day. Know how much alcohol is in your drink. In the [place] one drink equals one 12 oz bottle of beer (355 mL), one 5 oz glass of wine (148 mL), or one 1 oz glass of hard liquor (44 mL). Lifestyle Brush your teeth every morning and night with fluoride toothpaste. Floss one time each day. Exercise for at least 30 minutes 5 or more days each week. Do not use any products that contain nicotine or tobacco. These products include cigarettes, chewing tobacco, and vaping devices, such as e-cigarettes. If you need help quitting, ask your health care provider. Do not use drugs. If you are sexually active, practice safe sex. Use a condom or other form of protection in order to prevent STIs. Take aspirin only as told by  your health care provider. Make sure that you understand how much to take and what form to take. Work with your health care provider to find out whether it  is safe and beneficial for you to take aspirin daily. Ask your health care provider if you need to take a cholesterol-lowering medicine (statin). Find healthy ways to manage stress, such as: Meditation, yoga, or listening to music. Journaling. Talking to a trusted person. Spending time with friends and family. Minimize exposure to UV radiation to reduce your risk of skin cancer. Safety Always wear your seat belt while driving or riding in a vehicle. Do not drive: If you have been drinking alcohol. Do not ride with someone who has been drinking. When you are tired or distracted. While texting. If you have been using any mind-altering substances or drugs. Wear a helmet and other protective equipment during sports activities. If you have firearms in your house, make sure you follow all gun safety procedures. What's next? Visit your health care provider once a year for an annual wellness visit. Ask your health care provider how often you should have your eyes and teeth checked. Stay up to date on all vaccines. This information is not intended to replace advice given to you by your health care provider. Make sure you discuss any questions you have with your health care provider. Document Revised: 09/10/2020 Document Reviewed: 09/10/2020 Elsevier Patient Education  2024 [employer].

## 2023-05-17 ENCOUNTER — Telehealth: Payer: Medicare Other | Admitting: Adult Health

## 2023-05-17 ENCOUNTER — Other Ambulatory Visit: Payer: Medicare Other

## 2023-05-18 ENCOUNTER — Ambulatory Visit: Payer: Medicare Other | Admitting: Obstetrics

## 2023-05-18 DIAGNOSIS — Z01419 Encounter for gynecological examination (general) (routine) without abnormal findings: Secondary | ICD-10-CM

## 2023-05-19 ENCOUNTER — Other Ambulatory Visit (HOSPITAL_COMMUNITY): Payer: Self-pay

## 2023-05-19 ENCOUNTER — Telehealth: Payer: Self-pay

## 2023-05-19 NOTE — Telephone Encounter (Signed)
Pharmacy Patient Advocate Encounter  Received notification from [facility] that Prior Authorization for Nurtec 75MG  dispersible tablets has been APPROVED from 05/18/2023 to 05/17/2024. Unable to obtain price due to refill too soon rejection, last fill date 05/19/2023 next available fill date3/15/2025   PA #/Case ID/Reference #: PA Case ID #: [id number]

## 2023-05-19 NOTE — Telephone Encounter (Signed)
Pharmacy Patient Advocate Encounter   Received notification from CoverMyMeds that prior authorization for Nurtec 75MG  dispersible tablets is required/requested.   Insurance verification completed.   The patient is insured through [facility] .   Per test claim: PA required; PA submitted to above mentioned insurance via CoverMyMeds Key/confirmation #/EOC [id number] Status is pending

## 2023-05-20 DIAGNOSIS — F3132 Bipolar disorder, current episode depressed, moderate: Secondary | ICD-10-CM | POA: Diagnosis not present

## 2023-05-20 DIAGNOSIS — F411 Generalized anxiety disorder: Secondary | ICD-10-CM | POA: Diagnosis not present

## 2023-05-24 ENCOUNTER — Telehealth: Payer: Self-pay

## 2023-05-24 NOTE — Telephone Encounter (Signed)
 Copied from CRM [id number]. Topic: Clinical - Request for Lab/Test Order >> May 24, 2023 11:50 AM Isabell A wrote: Reason for CRM: Patient requesting lab orders to be sent to [facility], she will call back with fax number. >> May 24, 2023 11:56 AM Truddie Crumble wrote: Patient called back with the fax number for the lab orders  Fax-[phone]

## 2023-05-24 NOTE — Telephone Encounter (Signed)
 We have previously used this Fax number and attempted multiple transmissions with no success.

## 2023-05-26 NOTE — Progress Notes (Signed)
 GYNECOLOGY: ANNUAL EXAM   Subjective:    PCP: Sheliah Hatch, MD Theresa Martin is a 68 y.o. female [phone] who presents for annual wellness visit.   Well Woman Visit:  GYN HISTORY:  No LMP recorded. Patient is postmenopausal.     Menstrual History: OB History     Gravida  3   Para  2   Term  2   Preterm      AB  1   Living  2      SAB  1   IAB      Ectopic      Multiple      Live Births  2        Obstetric Comments  1st Menstrual Cycle:  11 1st Pregnancy:  64          Menarche age: 52 No LMP recorded. Patient is postmenopausal.  Urinary incontinence? no  Sexually active: no Number of sexual partners: n/a Gender of sexual Partners: n/a Social History   Substance and Sexual Activity  Sexual Activity Not Currently   Contraceptive methods: no method Dyspareunia? N/a STI history: no STI/HIV testing or immunizations needed? No.   Health Maintenance: -Last pap: was normal nilm  --> Any abnormals: yes  -Last mammogram: 09/16/22 --> Any abnormals? no -Last colon cancer screen: 09/02/22 / Type: colonoscopy -Last DEXA scan: 04/25/20 -[facility] / Colon / Cervical cancer: no -Vaccines:  Immunization History  Administered Date(s) Administered   Fluad Quad(high Dose 65+) 03/04/2020, 03/26/2022   Influenza,inj,Quad PF,6+ Mos 01/19/2017, 01/26/2019   Influenza-Unspecified 12/21/2020   Moderna Sars-Covid-2 Vaccination 09/03/2019, 09/24/2019, 02/16/2020, 02/05/2021   Pneumococcal Conjugate-13 01/17/2020   Pneumococcal Polysaccharide-23 12/25/2018   Tdap 07/27/2012   Zoster Recombinant(Shingrix) 05/04/2018, 10/26/2018   Zoster, Live 09/26/2014   Last Tdap: utd / Flu: declined / COVID: utd / Gardasil: ageout / Shingles (50+): utd / PCV20: utd -Hep C screen: utd -Last lipid / glucose screening: utd  > Exercise: walking, moderately active > Dietary Supplements: Folate: Yes;  Calcium: Yes}; Vitamin D: Yes > Body mass index is 22.48  kg/m.  > Recent dental visit Yes.   > Seat Belt Use: Yes.   > Texting and driving? No. > Guns in the house No. > Recreational or other drug use: denied.   Social History   Tobacco Use   Smoking status: Never   Smokeless tobacco: Never  Substance Use Topics   Alcohol use: No    Alcohol/week: 0.0 standard drinks of alcohol   Occupation: retired    Lives with: self   _______________________________________________  Current Outpatient Medications  Medication Sig Dispense Refill   acetaminophen (TYLENOL) 325 MG tablet 1 tablet as needed Orally every 6 hrs     acyclovir (ZOVIRAX) 800 MG tablet Take 800 mg by mouth 3 (three) times daily.     atorvastatin (LIPITOR) 20 MG tablet TAKE 1 TABLET BY MOUTH EVERY DAY 90 tablet 1   budesonide-formoterol (SYMBICORT) 160-4.5 MCG/ACT inhaler 1 puff as needed Inhalation every 4 hrs     buPROPion (WELLBUTRIN) 100 MG tablet Take 100 mg by mouth daily at 2 PM.     Cholecalciferol (VITAMIN D3 PO) Take 5,000 Units by mouth.     clonazePAM (KLONOPIN) 0.5 MG tablet Take 0.5 mg by mouth daily as needed.     diclofenac Sodium (VOLTAREN) 1 % GEL Apply 1 Application topically 4 (four) times daily as needed.     ELDERBERRY PO Take by mouth.  eletriptan (RELPAX) 40 MG tablet 1 tablet Orally Once a day     escitalopram (LEXAPRO) 10 MG tablet 1 tablet Orally Once a day     famotidine (PEPCID) 20 MG tablet Take 20 mg by mouth 2 (two) times daily.     Fremanezumab-vfrm (AJOVY) 225 MG/1.5ML SOAJ Inject 225 mg into the skin every 30 (thirty) days. 4.5 mL 4   Lactulose 20 GM/30ML SOLN Take 30 mLs (20 g total) by mouth 3 (three) times daily as needed. 900 mL 5   latanoprost (XALATAN) 0.005 % ophthalmic solution Place 1 drop into both eyes at bedtime. 2.5 mL 12   linaclotide (LINZESS) 290 MCG CAPS capsule Take 1 capsule (290 mcg total) by mouth daily before breakfast. 30 capsule 5   meloxicam (MOBIC) 15 MG tablet 1 tablet Orally Once a day     methylPREDNISolone  (MEDROL) 4 MG tablet 1 tablet with food or milk Orally every 12 hrs     Misc Natural Products (JOINT HEALTH PO) Take by mouth.     montelukast (SINGULAIR) 10 MG tablet 1 tablet Orally Once a day     Multiple Vitamins-Minerals (WOMENS MULTIVITAMIN PO) Take by mouth.     pantoprazole (PROTONIX) 40 MG tablet Take 1 tablet (40 mg total) by mouth daily. 30 tablet 5   Probiotic Product (PROBIOTIC PO) Take by mouth.     QUEtiapine (SEROQUEL XR) 400 MG 24 hr tablet Take 1 tablet (400 mg total) by mouth at bedtime. (Patient taking differently: Take 800 mg by mouth at bedtime.) 30 tablet 1   Rimegepant Sulfate (NURTEC) 75 MG TBDP Take 1 tablet (75 mg total) by mouth as needed (for breakthrough migraine). 8 tablet 12   rizatriptan (MAXALT-MLT) 10 MG disintegrating tablet Take 1 tablet (10 mg total) by mouth as needed for migraine (May repeat in 2 hours if needed). 10 tablet 12   Sennosides (SENOKOT PO) Take 20 mg by mouth in the morning and at bedtime.     temazepam (RESTORIL) 15 MG capsule Take 1 capsule (15 mg total) by mouth at bedtime. 30 capsule 0   triamterene-hydrochlorothiazide (MAXZIDE-25) 37.5-25 MG tablet TAKE 1 TABLET BY MOUTH EVERY DAY 90 tablet 1   Turmeric (QC TUMERIC COMPLEX) 500 MG CAPS Take by mouth.     UNABLE TO FIND Take 2 tablets by mouth at bedtime. Med Name: magnesium L theronate  650 mg at HS     zinc gluconate 50 MG tablet Take 50 mg by mouth daily.     No current facility-administered medications for this visit.   No Known Allergies  Past Medical History:  Diagnosis Date   Allergy    Anxiety    Asthma    Depression    Hyperlipidemia    Hypertension    Migraines    Osteopenia after menopause    Persistent headaches    Pneumonia 2020   Postherpetic neuralgia    Renal disorder    Shingles outbreak 02/2014   Skin cancer    moles on right groin and right buttock.   Slurred speech 09/09/2022   Past Surgical History:  Procedure Laterality Date   CARDIOVASCULAR STRESS  TEST  02/2009   treadmill stress test: Low risk   CESAREAN SECTION  1987, 1989   COLONOSCOPY     2014/2015 [facility], normal   COLONOSCOPY WITH PROPOFOL N/A 10/20/2021   Procedure: COLONOSCOPY WITH PROPOFOL;  Surgeon: Wyline Mood, MD;  Location: [facility] ENDOSCOPY;  Service: Gastroenterology;  Laterality: N/A;   COLONOSCOPY  WITH PROPOFOL N/A 09/02/2022   Procedure: COLONOSCOPY WITH PROPOFOL;  Surgeon: Toney Reil, MD;  Location: [facility] ENDOSCOPY;  Service: Gastroenterology;  Laterality: N/A;  Requests about 11:15 arrival   DIAGNOSTIC LAPAROSCOPY     ESOPHAGOGASTRODUODENOSCOPY (EGD) WITH PROPOFOL N/A 09/02/2022   Procedure: ESOPHAGOGASTRODUODENOSCOPY (EGD) WITH PROPOFOL;  Surgeon: Toney Reil, MD;  Location: [facility] ENDOSCOPY;  Service: Gastroenterology;  Laterality: N/A;   EXPLORATORY LAPAROTOMY     laproscopy for infertility   EYE SURGERY Bilateral    laser-correct opening b/tn cornea and iris   MOLE REMOVAL  2017   x 2 moles    Review Of Systems  Constitutional: Denied constitutional symptoms, night sweats, recent illness, fatigue, fever, insomnia and weight loss.  Eyes: Denied eye symptoms, eye pain, photophobia, vision change and visual disturbance.  Ears/Nose/Throat/Neck: Denied ear, nose, throat or neck symptoms, hearing loss, nasal discharge, sinus congestion and sore throat.  Cardiovascular: Denied cardiovascular symptoms, arrhythmia, chest pain/pressure, edema, exercise intolerance, orthopnea and palpitations.  Respiratory: Denied pulmonary symptoms, asthma, pleuritic pain, productive sputum, cough, dyspnea and wheezing.  Gastrointestinal: Denied, gastro-esophageal reflux, melena, nausea and vomiting.  Genitourinary: Denied genitourinary symptoms including symptomatic vaginal discharge, pelvic relaxation issues, and urinary complaints.  Musculoskeletal: Denied musculoskeletal symptoms, stiffness, swelling, muscle weakness and myalgia.  Dermatologic: Denied dermatology  symptoms, rash and scar.  Neurologic: Denied neurology symptoms, dizziness, headache, neck pain and syncope.  Psychiatric: Denied psychiatric symptoms, anxiety and depression.  Endocrine: Denied endocrine symptoms including hot flashes and night sweats.      Objective:    BP 131/64   Pulse 88   Ht 5\' 1"  (1.549 m)   Wt 119 lb (54 kg)   BMI 22.48 kg/m   Constitutional: Well-developed, well-nourished female in no acute distress Neurological: Alert and oriented to person, place, and time Psychiatric: Mood and affect appropriate Skin: No rashes or lesions Neck: Supple without masses. Trachea is midline.Thyroid is normal size without masses Lymphatics: No cervical, axillary, supraclavicular, or inguinal adenopathy noted Respiratory: Clear to auscultation bilaterally. Good air movement with normal work of breathing. Cardiovascular: Regular rate and rhythm. Extremities grossly normal, nontender with no edema; pulses regular Gastrointestinal: Soft, nontender, nondistended. No masses or hernias appreciated. No hepatosplenomegaly. No fluid wave. No rebound or guarding. Breast Exam: normal appearance, no masses or tenderness, Inspection negative, No nipple retraction or dimpling, No nipple discharge or bleeding, No axillary or supraclavicular adenopathy, Normal to palpation without dominant masses Genitourinary:         External Genitalia: Normal female genitalia    Vagina: Atrophic mucosa, no lesions.    Cervix: No lesions, normal size and consistency; no cervical motion tenderness; non-friable; Pap obtained.    Uterus: Normal size and contour; smooth, mobile, NT. Adnexae: Non-palpable and non-tender Perineum/Anus: No lesions Rectal: deferred    Assessment/Plan:    Theresa Martin is a 68 y.o. female [phone] with normal well-woman gynecologic exam.  -Screenings:  Pap: done with cotesting today; discussed ASCCP guidelines about ageing out of screening and pt desires if this pap WNL.   Mammogram: PCP (due June '25) Colon: PCP Dexa: PCP Labs: CBC (PCP) -Vaccines: UTD, Tdap today; pt declines Influenza. Gardasil: series not started, declined today.  -Healthy lifestyle modifications discussed: multivitamin, diet, exercise, sunscreen, tobacco and alcohol use. Emphasized importance of regular physical activity.  -Calcium and Vit D recommendation reviewed.  -All questions answered to patient's satisfaction.  -Invited pt to continue her annual exams or Medicare Wellness Exams with her PCP after this one, since  this may be her last pap and they are managing everything else with her preventative health.   Return if symptoms worsen or fail to improve.    Julieanne Manson, DO [facility] at [facility]

## 2023-05-26 NOTE — Patient Instructions (Signed)
 Preventive Care 43 Years and Older, Female Preventive care refers to lifestyle choices and visits with your health care provider that can promote health and wellness. Preventive care visits are also called wellness exams. What can I expect for my preventive care visit? Counseling Your health care provider may ask you questions about your: Medical history, including: Past medical problems. Family medical history. Pregnancy and menstrual history. History of falls. Current health, including: Memory and ability to understand (cognition). Emotional well-being. Home life and relationship well-being. Sexual activity and sexual health. Lifestyle, including: Alcohol, nicotine or tobacco, and drug use. Access to firearms. Diet, exercise, and sleep habits. Work and work Astronomer. Sunscreen use. Safety issues such as seatbelt and bike helmet use. Physical exam Your health care provider will check your: Height and weight. These may be used to calculate your BMI (body mass index). BMI is a measurement that tells if you are at a healthy weight. Waist circumference. This measures the distance around your waistline. This measurement also tells if you are at a healthy weight and may help predict your risk of certain diseases, such as type 2 diabetes and high blood pressure. Heart rate and blood pressure. Body temperature. Skin for abnormal spots. What immunizations do I need?  Vaccines are usually given at various ages, according to a schedule. Your health care provider will recommend vaccines for you based on your age, medical history, and lifestyle or other factors, such as travel or where you work. What tests do I need? Screening Your health care provider may recommend screening tests for certain conditions. This may include: Lipid and cholesterol levels. Hepatitis C test. Hepatitis B test. HIV (human immunodeficiency virus) test. STI (sexually transmitted infection) testing, if you are at  risk. Lung cancer screening. Colorectal cancer screening. Diabetes screening. This is done by checking your blood sugar (glucose) after you have not eaten for a while (fasting). Mammogram. Talk with your health care provider about how often you should have regular mammograms. BRCA-related cancer screening. This may be done if you have a family history of breast, ovarian, tubal, or peritoneal cancers. Bone density scan. This is done to screen for osteoporosis. Talk with your health care provider about your test results, treatment options, and if necessary, the need for more tests. Follow these instructions at home: Eating and drinking  Eat a diet that includes fresh fruits and vegetables, whole grains, lean protein, and low-fat dairy products. Limit your intake of foods with high amounts of sugar, saturated fats, and salt. Take vitamin and mineral supplements as recommended by your health care provider. Do not drink alcohol if your health care provider tells you not to drink. If you drink alcohol: Limit how much you have to 0-1 drink a day. Know how much alcohol is in your drink. In the [place] one drink equals one 12 oz bottle of beer (355 mL), one 5 oz glass of wine (148 mL), or one 1 oz glass of hard liquor (44 mL). Lifestyle Brush your teeth every morning and night with fluoride toothpaste. Floss one time each day. Exercise for at least 30 minutes 5 or more days each week. Do not use any products that contain nicotine or tobacco. These products include cigarettes, chewing tobacco, and vaping devices, such as e-cigarettes. If you need help quitting, ask your health care provider. Do not use drugs. If you are sexually active, practice safe sex. Use a condom or other form of protection in order to prevent STIs. Take aspirin only as told by  your health care provider. Make sure that you understand how much to take and what form to take. Work with your health care provider to find out whether it  is safe and beneficial for you to take aspirin daily. Ask your health care provider if you need to take a cholesterol-lowering medicine (statin). Find healthy ways to manage stress, such as: Meditation, yoga, or listening to music. Journaling. Talking to a trusted person. Spending time with friends and family. Minimize exposure to UV radiation to reduce your risk of skin cancer. Safety Always wear your seat belt while driving or riding in a vehicle. Do not drive: If you have been drinking alcohol. Do not ride with someone who has been drinking. When you are tired or distracted. While texting. If you have been using any mind-altering substances or drugs. Wear a helmet and other protective equipment during sports activities. If you have firearms in your house, make sure you follow all gun safety procedures. What's next? Visit your health care provider once a year for an annual wellness visit. Ask your health care provider how often you should have your eyes and teeth checked. Stay up to date on all vaccines. This information is not intended to replace advice given to you by your health care provider. Make sure you discuss any questions you have with your health care provider. Document Revised: 09/10/2020 Document Reviewed: 09/10/2020 Elsevier Patient Education  2024 [employer].

## 2023-05-30 ENCOUNTER — Other Ambulatory Visit (HOSPITAL_COMMUNITY)
Admission: RE | Admit: 2023-05-30 | Discharge: 2023-05-30 | Disposition: A | Discharge status: Home / Self Care | Source: Ambulatory Visit | Attending: Obstetrics | Admitting: Obstetrics

## 2023-05-30 ENCOUNTER — Ambulatory Visit (INDEPENDENT_AMBULATORY_CARE_PROVIDER_SITE_OTHER): Payer: Medicare Other | Admitting: Obstetrics

## 2023-05-30 ENCOUNTER — Encounter: Payer: Self-pay | Admitting: Obstetrics

## 2023-05-30 VITALS — BP 131/64 | HR 88 | Ht 61.0 in | Wt 119.0 lb

## 2023-05-30 DIAGNOSIS — Z1151 Encounter for screening for human papillomavirus (HPV): Secondary | ICD-10-CM | POA: Insufficient documentation

## 2023-05-30 DIAGNOSIS — Z124 Encounter for screening for malignant neoplasm of cervix: Secondary | ICD-10-CM

## 2023-05-30 DIAGNOSIS — D72829 Elevated white blood cell count, unspecified: Secondary | ICD-10-CM | POA: Diagnosis not present

## 2023-05-30 DIAGNOSIS — Z01419 Encounter for gynecological examination (general) (routine) without abnormal findings: Secondary | ICD-10-CM | POA: Insufficient documentation

## 2023-05-30 NOTE — Addendum Note (Signed)
 Addended by: Cornelius Moras D on: 05/30/2023 02:16 PM   Modules accepted: Orders

## 2023-05-31 ENCOUNTER — Encounter: Payer: Self-pay | Admitting: Family Medicine

## 2023-05-31 LAB — CBC WITH DIFFERENTIAL/PLATELET
Basophils Absolute: 0 10*3/uL (ref 0.0–0.2)
Basos: 1 %
EOS (ABSOLUTE): 0 10*3/uL (ref 0.0–0.4)
Eos: 0 %
Hematocrit: 38 % (ref 34.0–46.6)
Hemoglobin: 12.9 g/dL (ref 11.1–15.9)
Immature Grans (Abs): 0 10*3/uL (ref 0.0–0.1)
Immature Granulocytes: 0 %
Lymphocytes Absolute: 1.1 10*3/uL (ref 0.7–3.1)
Lymphs: 20 %
MCH: 30.1 pg (ref 26.6–33.0)
MCHC: 33.9 g/dL (ref 31.5–35.7)
MCV: 89 fL (ref 79–97)
Monocytes Absolute: 0.3 10*3/uL (ref 0.1–0.9)
Monocytes: 6 %
Neutrophils Absolute: 3.8 10*3/uL (ref 1.4–7.0)
Neutrophils: 73 %
Platelets: 273 10*3/uL (ref 150–450)
RBC: 4.28 x10E6/uL (ref 3.77–5.28)
RDW: 11.6 % — ABNORMAL LOW (ref 11.7–15.4)
WBC: 5.2 10*3/uL (ref 3.4–10.8)

## 2023-05-31 NOTE — Telephone Encounter (Signed)
-----   Message from Neena Rhymes sent at 05/31/2023  7:06 AM EST ----- White blood cell count is now normal- this is great news!

## 2023-05-31 NOTE — Telephone Encounter (Signed)
 Called Patient to discuss lab results, Left VM to return call.

## 2023-06-02 ENCOUNTER — Encounter: Payer: Self-pay | Admitting: Obstetrics

## 2023-06-02 LAB — CYTOLOGY - PAP
Comment: NEGATIVE
Diagnosis: NEGATIVE
High risk HPV: NEGATIVE

## 2023-06-08 ENCOUNTER — Telehealth: Payer: Self-pay

## 2023-06-08 NOTE — Telephone Encounter (Signed)
 Patient dropped off [employer] patient assistance forms- patient completed her par form was completed and faxed to [phone] along with last 2 office visits.

## 2023-06-09 DIAGNOSIS — F3132 Bipolar disorder, current episode depressed, moderate: Secondary | ICD-10-CM | POA: Diagnosis not present

## 2023-06-09 DIAGNOSIS — F411 Generalized anxiety disorder: Secondary | ICD-10-CM | POA: Diagnosis not present

## 2023-06-13 ENCOUNTER — Ambulatory Visit: Payer: Medicare Other | Admitting: Diagnostic Neuroimaging

## 2023-06-14 ENCOUNTER — Ambulatory Visit (INDEPENDENT_AMBULATORY_CARE_PROVIDER_SITE_OTHER): Payer: Medicare Other | Admitting: *Deleted

## 2023-06-14 DIAGNOSIS — Z0000 Encounter for general adult medical examination without abnormal findings: Secondary | ICD-10-CM

## 2023-06-14 NOTE — Progress Notes (Signed)
 Subjective:   Theresa Martin is a 68 y.o. female who presents for Medicare Annual (Subsequent) preventive examination.  Visit Complete: Virtual I connected with  Theresa Martin on 06/14/23 by a audio enabled telemedicine application and verified that I am speaking with the correct person using two identifiers.  Patient Location: Home  Provider Location: Home Office  I discussed the limitations of evaluation and management by telemedicine. The patient expressed understanding and agreed to proceed.  Vital Signs: Because this visit was a virtual/telehealth visit, some criteria may be missing or patient reported. Any vitals not documented were not able to be obtained and vitals that have been documented are patient reported.   Cardiac Risk Factors include: advanced age (>80men, >10 women);hypertension     Objective:    There were no vitals filed for this visit. There is no height or weight on file to calculate BMI.     06/14/2023    1:10 PM 09/09/2022    4:02 PM 09/09/2022    1:13 PM 09/02/2022   12:29 PM 04/29/2022   11:34 AM 04/09/2021    3:48 PM 03/24/2020    9:04 AM  Advanced Directives  Does Patient Have a Medical Advance Directive? Yes No No No Yes Yes No  Type of Psychiatrist of [employer] Power of Attorney   Copy of Healthcare Power of Attorney in Chart? Yes - validated most recent copy scanned in chart (See row information)     No - copy requested   Would patient like information on creating a medical advance directive?  No - Patient declined No - Patient declined No - Patient declined   No - Patient declined    Current Medications (verified) Outpatient Encounter Medications as of 06/14/2023  Medication Sig   acetaminophen (TYLENOL) 325 MG tablet 1 tablet as needed Orally every 6 hrs   acyclovir (ZOVIRAX) 800 MG tablet Take 800 mg by mouth 3 (three) times daily.   atorvastatin (LIPITOR) 20 MG tablet  TAKE 1 TABLET BY MOUTH EVERY DAY   budesonide-formoterol (SYMBICORT) 160-4.5 MCG/ACT inhaler 1 puff as needed Inhalation every 4 hrs   buPROPion (WELLBUTRIN) 100 MG tablet Take 100 mg by mouth daily at 2 PM.   Cholecalciferol (VITAMIN D3 PO) Take 5,000 Units by mouth.   clonazePAM (KLONOPIN) 0.5 MG tablet Take 0.5 mg by mouth daily as needed.   diclofenac Sodium (VOLTAREN) 1 % GEL Apply 1 Application topically 4 (four) times daily as needed.   ELDERBERRY PO Take by mouth.   eletriptan (RELPAX) 40 MG tablet 1 tablet Orally Once a day   escitalopram (LEXAPRO) 10 MG tablet 1 tablet Orally Once a day   famotidine (PEPCID) 20 MG tablet Take 20 mg by mouth 2 (two) times daily.   Fremanezumab-vfrm (AJOVY) 225 MG/1.5ML SOAJ Inject 225 mg into the skin every 30 (thirty) days.   Lactulose 20 GM/30ML SOLN Take 30 mLs (20 g total) by mouth 3 (three) times daily as needed.   latanoprost (XALATAN) 0.005 % ophthalmic solution Place 1 drop into both eyes at bedtime.   linaclotide (LINZESS) 290 MCG CAPS capsule Take 1 capsule (290 mcg total) by mouth daily before breakfast.   meloxicam (MOBIC) 15 MG tablet 1 tablet Orally Once a day   methylPREDNISolone (MEDROL) 4 MG tablet 1 tablet with food or milk Orally every 12 hrs   Misc Natural Products (JOINT HEALTH PO) Take by mouth.   montelukast (SINGULAIR)  10 MG tablet 1 tablet Orally Once a day   Multiple Vitamins-Minerals (WOMENS MULTIVITAMIN PO) Take by mouth.   pantoprazole (PROTONIX) 40 MG tablet Take 1 tablet (40 mg total) by mouth daily.   Probiotic Product (PROBIOTIC PO) Take by mouth.   QUEtiapine (SEROQUEL XR) 400 MG 24 hr tablet Take 1 tablet (400 mg total) by mouth at bedtime. (Patient taking differently: Take 800 mg by mouth at bedtime.)   Rimegepant Sulfate (NURTEC) 75 MG TBDP Take 1 tablet (75 mg total) by mouth as needed (for breakthrough migraine).   rizatriptan (MAXALT-MLT) 10 MG disintegrating tablet Take 1 tablet (10 mg total) by mouth as needed  for migraine (May repeat in 2 hours if needed).   Sennosides (SENOKOT PO) Take 20 mg by mouth in the morning and at bedtime.   temazepam (RESTORIL) 15 MG capsule Take 1 capsule (15 mg total) by mouth at bedtime.   triamterene-hydrochlorothiazide (MAXZIDE-25) 37.5-25 MG tablet TAKE 1 TABLET BY MOUTH EVERY DAY   Turmeric (QC TUMERIC COMPLEX) 500 MG CAPS Take by mouth.   UNABLE TO FIND Take 2 tablets by mouth at bedtime. Med Name: magnesium L theronate  650 mg at HS   zinc gluconate 50 MG tablet Take 50 mg by mouth daily.   No facility-administered encounter medications on file as of 06/14/2023.    Allergies (verified) Patient has no known allergies.   History: Past Medical History:  Diagnosis Date   Allergy    Anxiety    Asthma    Depression    Hyperlipidemia    Hypertension    Migraines    Osteopenia after menopause    Persistent headaches    Pneumonia 2020   Postherpetic neuralgia    Renal disorder    Shingles outbreak 02/2014   Skin cancer    moles on right groin and right buttock.   Slurred speech 09/09/2022   Past Surgical History:  Procedure Laterality Date   CARDIOVASCULAR STRESS TEST  02/2009   treadmill stress test: Low risk   CESAREAN SECTION  1987, 1989   COLONOSCOPY     2014/2015 [facility], normal   COLONOSCOPY WITH PROPOFOL N/A 10/20/2021   Procedure: COLONOSCOPY WITH PROPOFOL;  Surgeon: Wyline Mood, MD;  Location: [facility] ENDOSCOPY;  Service: Gastroenterology;  Laterality: N/A;   COLONOSCOPY WITH PROPOFOL N/A 09/02/2022   Procedure: COLONOSCOPY WITH PROPOFOL;  Surgeon: Toney Reil, MD;  Location: [facility] ENDOSCOPY;  Service: Gastroenterology;  Laterality: N/A;  Requests about 11:15 arrival   DIAGNOSTIC LAPAROSCOPY     ESOPHAGOGASTRODUODENOSCOPY (EGD) WITH PROPOFOL N/A 09/02/2022   Procedure: ESOPHAGOGASTRODUODENOSCOPY (EGD) WITH PROPOFOL;  Surgeon: Toney Reil, MD;  Location: [facility] ENDOSCOPY;  Service: Gastroenterology;  Laterality: N/A;   EXPLORATORY  LAPAROTOMY     laproscopy for infertility   EYE SURGERY Bilateral    laser-correct opening b/tn cornea and iris   MOLE REMOVAL  2017   x 2 moles   Family History  Problem Relation Age of Onset   Hypertension Mother    Coronary artery disease Mother    Heart failure Mother    Transient ischemic attack Mother    Hypertension Father    Liver disease Sister        liver failure   Migraines Sister    Arrhythmia Brother    Hypertension Brother    Cancer Neg Hx        colon    Breast cancer Neg Hx    Social History   Socioeconomic History  Marital status: Legally Separated    Spouse name: Not on file   Number of children: 2   Years of education: Not on file   Highest education level: 12th grade  Occupational History   Occupation: Geologist, engineering   Occupation: retired  Tobacco Use   Smoking status: Never   Smokeless tobacco: Never  Vaping Use   Vaping status: Never Used  Substance and Sexual Activity   Alcohol use: No    Alcohol/week: 0.0 standard drinks of alcohol   Drug use: No   Sexual activity: Not Currently  Other Topics Concern   Not on file  Social History Narrative   Regular exercise- yes, spins daily   Diet: fruits and veggies, water    No caffeine   Lives alone    Retired    Teacher, early years/pre Strain: Low Risk  (06/14/2023)   Overall Financial Resource Strain (CARDIA)    Difficulty of Paying Living Expenses: Not hard at all  Food Insecurity: No Food Insecurity (06/14/2023)   Hunger Vital Sign    Worried About Running Out of Food in the Last Year: Never true    Ran Out of Food in the Last Year: Never true  Transportation Needs: No Transportation Needs (06/14/2023)   PRAPARE - Administrator, Civil Service (Medical): No    Lack of Transportation (Non-Medical): No  Physical Activity: Insufficiently Active (06/14/2023)   Exercise Vital Sign    Days of Exercise per Week: 3 days    Minutes of Exercise per Session:  20 min  Stress: Stress Concern Present (06/14/2023)   [employer] of Occupational Health - Occupational Stress Questionnaire    Feeling of Stress : To some extent  Social Connections: Moderately Integrated (06/14/2023)   Social Connection and Isolation Panel [NHANES]    Frequency of Communication with Friends and Family: Twice a week    Frequency of Social Gatherings with Friends and Family: Once a week    Attends Religious Services: More than 4 times per year    Active Member of [employer] or Organizations: Yes    Attends Engineer, structural: More than 4 times per year    Marital Status: Separated    Tobacco Counseling Counseling given: Not Answered   Clinical Intake:  Pre-visit preparation completed: Yes  Pain : No/denies pain     Diabetes: No  How often do you need to have someone help you when you read instructions, pamphlets, or other written materials from your doctor or pharmacy?: 1 - Never  Interpreter Needed?: No  Information entered by :: Remi Haggard LPN   Activities of Daily Living    06/14/2023    1:11 PM 05/02/2023    6:36 PM  In your present state of health, do you have any difficulty performing the following activities:  Hearing? 0 0  Vision? 0 0  Difficulty concentrating or making decisions? 1 1  Walking or climbing stairs? 0 0  Dressing or bathing? 0 0  Doing errands, shopping? 0 0  Preparing Food and eating ? N N  Using the Toilet? N N  In the past six months, have you accidently leaked urine? N N  Do you have problems with loss of bowel control? N N  Managing your Medications? N N  Managing your Finances? N N  Housekeeping or managing your Housekeeping? N N    Patient Care Team: Sheliah Hatch, MD as PCP - General (Family Medicine) Farrel Conners,  CNM (Inactive) as Midwife (Certified Nurse Midwife) Lemar Livings, Merrily Pew, MD (General Surgery) Genevieve Norlander, MD (Psychiatry) Suanne Marker, MD as Consulting Physician  (Neurology) Mardella Layman, MD as Consulting Physician (Gastroenterology) Theresa Mulligan (Psychiatry) Tama Headings [facility]) Dermatology, [facility] (Dermatology) Andrena Mews, DO as Consulting Physician (Family Medicine) Shawnie Dapper, NP as Nurse Practitioner (Neurology) Erin Fulling, MD as Consulting Physician (Pulmonary Disease) Dahlia Byes, [facility] as Pharmacist (Pharmacist)  Indicate any recent Medical Services you may have received from other than [facility] providers in the past year (date may be approximate).     Assessment:   This is a routine wellness examination for Britta.  Hearing/Vision screen Hearing Screening - Comments:: No trouble hearing Vision Screening - Comments:: Up to date thurman   Goals Addressed             This Visit's Progress    Patient Stated   On track    Maintain current health by staying active and eating well.      Patient Stated       Continue current lifestyle       Depression Screen    06/14/2023    1:13 PM 04/13/2023   12:53 PM 09/16/2022    2:43 PM 08/04/2022    2:06 PM 04/29/2022   12:34 PM 03/26/2022   10:52 AM 03/04/2022   11:37 AM  PHQ 2/9 Scores  PHQ - 2 Score 2 1 0 2 0 0 0  PHQ- 9 Score 7 1 0 6 0 0 0    Fall Risk    06/14/2023    1:09 PM 05/02/2023    6:36 PM 04/13/2023   12:53 PM 09/16/2022    2:43 PM 08/04/2022    2:06 PM  Fall Risk   Falls in the past year? 0 0 0 0 0  Number falls in past yr: 0  0 0 0  Injury with Fall? 0  0 0 0  Risk for fall due to :   No Fall Risks No Fall Risks No Fall Risks  Follow up Falls evaluation completed;Education provided;Falls prevention discussed   Falls evaluation completed Falls evaluation completed    MEDICARE RISK AT HOME: Medicare Risk at Home Any stairs in or around the home?: Yes If so, are there any without handrails?: No Home free of loose throw rugs in walkways, pet beds, electrical cords, etc?: Yes Adequate lighting in your home to reduce risk of  falls?: Yes Life alert?: No Use of a cane, walker or w/c?: No Grab bars in the bathroom?: No Shower chair or bench in shower?: No Elevated toilet seat or a handicapped toilet?: No  TIMED UP AND GO:  Was the test performed?  No    Cognitive Function:    06/03/2016    3:23 PM  MMSE - Mini Mental State Exam  Orientation to time 5  Orientation to Place 5  Registration 3  Attention/ Calculation 5  Recall 3  Language- name 2 objects 2  Language- repeat 1  Language- follow 3 step command 3  Language- read & follow direction 1  Write a sentence 1  Copy design 1  Total score 30        06/14/2023    1:11 PM 04/29/2022   12:35 PM  6CIT Screen  What Year? 0 points 0 points  What month? 0 points 0 points  What time? 0 points 0 points  Count back from 20 0 points   Months in  reverse 0 points 0 points  Repeat phrase 0 points   Total Score 0 points     Immunizations Immunization History  Administered Date(s) Administered   Fluad Quad(high Dose 65+) 03/04/2020, 03/26/2022   Influenza,inj,Quad PF,6+ Mos 01/19/2017, 01/26/2019   Influenza-Unspecified 12/21/2020   Moderna Sars-Covid-2 Vaccination 09/03/2019, 09/24/2019, 02/16/2020, 02/05/2021   Pneumococcal Conjugate-13 01/17/2020   Pneumococcal Polysaccharide-23 12/25/2018   Tdap 07/27/2012   Zoster Recombinant(Shingrix) 05/04/2018, 10/26/2018   Zoster, Live 09/26/2014    TDAP status: Due, Education has been provided regarding the importance of this vaccine. Advised may receive this vaccine at local pharmacy or Health Dept. Aware to provide a copy of the vaccination record if obtained from local pharmacy or Health Dept. Verbalized acceptance and understanding.  Flu Vaccine status: Declined, Education has been provided regarding the importance of this vaccine but patient still declined. Advised may receive this vaccine at local pharmacy or Health Dept. Aware to provide a copy of the vaccination record if obtained from local  pharmacy or Health Dept. Verbalized acceptance and understanding.  Pneumococcal vaccine status: Up to date  Covid-19 vaccine status: Information provided on how to obtain vaccines.   Qualifies for Shingles Vaccine? No   Zostavax completed Yes   Shingrix Completed?: Yes  Screening Tests Health Maintenance  Topic Date Due   DTaP/Tdap/Td (2 - Td or Tdap) 07/28/2022   COVID-19 Vaccine (5 - 2024-25 season) 11/28/2022   INFLUENZA VACCINE  06/27/2023 (Originally 10/28/2022)   MAMMOGRAM  09/16/2023   Pneumonia Vaccine 78+ Years old (3 of 3 - PPSV23 or PCV20) 12/25/2023   Medicare Annual Wellness (AWV)  06/13/2024   Colonoscopy  09/02/2027   DEXA SCAN  Completed   Hepatitis C Screening  Completed   Zoster Vaccines- Shingrix  Completed   HPV VACCINES  Aged Out    Health Maintenance  Health Maintenance Due  Topic Date Due   DTaP/Tdap/Td (2 - Td or Tdap) 07/28/2022   COVID-19 Vaccine (5 - 2024-25 season) 11/28/2022    Colorectal cancer screening: Type of screening: Colonoscopy. Completed 2024. Repeat every 5 years  Mammogram status: Completed  . Repeat every year  Bone Density status: Completed 2022. Results reflect: Bone density results: OSTEOPENIA. Repeat every 5 years.  Lung Cancer Screening: (Low Dose CT Chest recommended if Age 35-80 years, 20 pack-year currently smoking OR have quit w/in 15years.) does not qualify.   Lung Cancer Screening Referral:   Additional Screening:  Hepatitis C Screening: does not qualify; Completed 2017  Vision Screening: Recommended annual ophthalmology exams for early detection of glaucoma and other disorders of the eye. Is the patient up to date with their annual eye exam?  Yes  Who is the provider or what is the name of the office in which the patient attends annual eye exams? [facility] If pt is not established with a provider, would they like to be referred to a provider to establish care? No .   Dental Screening: Recommended annual dental  exams for proper oral hygiene   Community Resource Referral / Chronic Care Management: CRR required this visit?  No   CCM required this visit?  No     Plan:     I have personally reviewed and noted the following in the patient's chart:   Medical and social history Use of alcohol, tobacco or illicit drugs  Current medications and supplements including opioid prescriptions. Patient is not currently taking opioid prescriptions. Functional ability and status Nutritional status Physical activity Advanced directives List of other physicians  Hospitalizations, surgeries, and ER visits in previous 12 months Vitals Screenings to include cognitive, depression, and falls Referrals and appointments  In addition, I have reviewed and discussed with patient certain preventive protocols, quality metrics, and best practice recommendations. A written personalized care plan for preventive services as well as general preventive health recommendations were provided to patient.     Remi Haggard, LPN   [id number]   After Visit Summary: (MyChart) Due to this being a telephonic visit, the after visit summary with patients personalized plan was offered to patient via MyChart   Nurse Notes:

## 2023-06-14 NOTE — Patient Instructions (Signed)
 Theresa Martin , Thank you for taking time to come for your Medicare Wellness Visit. I appreciate your ongoing commitment to your health goals. Please review the following plan we discussed and let me know if I can assist you in the future.   Screening recommendations/referrals: Colonoscopy: up to date Mammogram: up to date Bone Density:  up to date Recommended yearly ophthalmology/optometry visit for glaucoma screening and checkup Recommended yearly dental visit for hygiene and checkup  Vaccinations: Influenza vaccine:  Pneumococcal vaccine:  Tdap vaccine:  Shingles vaccine:         Preventive Care 65 Years and Older, Female Preventive care refers to lifestyle choices and visits with your health care provider that can promote health and wellness. What does preventive care include? A yearly physical exam. This is also called an annual well check. Dental exams once or twice a year. Routine eye exams. Ask your health care provider how often you should have your eyes checked. Personal lifestyle choices, including: Daily care of your teeth and gums. Regular physical activity. Eating a healthy diet. Avoiding tobacco and drug use. Limiting alcohol use. Practicing safe sex. Taking low-dose aspirin every day. Taking vitamin and mineral supplements as recommended by your health care provider. What happens during an annual well check? The services and screenings done by your health care provider during your annual well check will depend on your age, overall health, lifestyle risk factors, and family history of disease. Counseling  Your health care provider may ask you questions about your: Alcohol use. Tobacco use. Drug use. Emotional well-being. Home and relationship well-being. Sexual activity. Eating habits. History of falls. Memory and ability to understand (cognition). Work and work Astronomer. Reproductive health. Screening  You may have the following tests or  measurements: Height, weight, and BMI. Blood pressure. Lipid and cholesterol levels. These may be checked every 5 years, or more frequently if you are over 9 years old. Skin check. Lung cancer screening. You may have this screening every year starting at age 82 if you have a 30-pack-year history of smoking and currently smoke or have quit within the past 15 years. Fecal occult blood test (FOBT) of the stool. You may have this test every year starting at age 2. Flexible sigmoidoscopy or colonoscopy. You may have a sigmoidoscopy every 5 years or a colonoscopy every 10 years starting at age 56. Hepatitis C blood test. Hepatitis B blood test. Sexually transmitted disease (STD) testing. Diabetes screening. This is done by checking your blood sugar (glucose) after you have not eaten for a while (fasting). You may have this done every 1-3 years. Bone density scan. This is done to screen for osteoporosis. You may have this done starting at age 79. Mammogram. This may be done every 1-2 years. Talk to your health care provider about how often you should have regular mammograms. Talk with your health care provider about your test results, treatment options, and if necessary, the need for more tests. Vaccines  Your health care provider may recommend certain vaccines, such as: Influenza vaccine. This is recommended every year. Tetanus, diphtheria, and acellular pertussis (Tdap, Td) vaccine. You may need a Td booster every 10 years. Zoster vaccine. You may need this after age 35. Pneumococcal 13-valent conjugate (PCV13) vaccine. One dose is recommended after age 25. Pneumococcal polysaccharide (PPSV23) vaccine. One dose is recommended after age 89. Talk to your health care provider about which screenings and vaccines you need and how often you need them. This information is not intended to  replace advice given to you by your health care provider. Make sure you discuss any questions you have with your  health care provider. Document Released: 04/11/2015 Document Revised: 12/03/2015 Document Reviewed: 01/14/2015 Elsevier Interactive Patient Education  2017 [employer].  Fall Prevention in the Home Falls can cause injuries. They can happen to people of all ages. There are many things you can do to make your home safe and to help prevent falls. What can I do on the outside of my home? Regularly fix the edges of walkways and driveways and fix any cracks. Remove anything that might make you trip as you walk through a door, such as a raised step or threshold. Trim any bushes or trees on the path to your home. Use bright outdoor lighting. Clear any walking paths of anything that might make someone trip, such as rocks or tools. Regularly check to see if handrails are loose or broken. Make sure that both sides of any steps have handrails. Any raised decks and porches should have guardrails on the edges. Have any leaves, snow, or ice cleared regularly. Use sand or salt on walking paths during winter. Clean up any spills in your garage right away. This includes oil or grease spills. What can I do in the bathroom? Use night lights. Install grab bars by the toilet and in the tub and shower. Do not use towel bars as grab bars. Use non-skid mats or decals in the tub or shower. If you need to sit down in the shower, use a plastic, non-slip stool. Keep the floor dry. Clean up any water that spills on the floor as soon as it happens. Remove soap buildup in the tub or shower regularly. Attach bath mats securely with double-sided non-slip rug tape. Do not have throw rugs and other things on the floor that can make you trip. What can I do in the bedroom? Use night lights. Make sure that you have a light by your bed that is easy to reach. Do not use any sheets or blankets that are too big for your bed. They should not hang down onto the floor. Have a firm chair that has side arms. You can use this for  support while you get dressed. Do not have throw rugs and other things on the floor that can make you trip. What can I do in the kitchen? Clean up any spills right away. Avoid walking on wet floors. Keep items that you use a lot in easy-to-reach places. If you need to reach something above you, use a strong step stool that has a grab bar. Keep electrical cords out of the way. Do not use floor polish or wax that makes floors slippery. If you must use wax, use non-skid floor wax. Do not have throw rugs and other things on the floor that can make you trip. What can I do with my stairs? Do not leave any items on the stairs. Make sure that there are handrails on both sides of the stairs and use them. Fix handrails that are broken or loose. Make sure that handrails are as long as the stairways. Check any carpeting to make sure that it is firmly attached to the stairs. Fix any carpet that is loose or worn. Avoid having throw rugs at the top or bottom of the stairs. If you do have throw rugs, attach them to the floor with carpet tape. Make sure that you have a light switch at the top of the stairs and the  bottom of the stairs. If you do not have them, ask someone to add them for you. What else can I do to help prevent falls? Wear shoes that: Do not have high heels. Have rubber bottoms. Are comfortable and fit you well. Are closed at the toe. Do not wear sandals. If you use a stepladder: Make sure that it is fully opened. Do not climb a closed stepladder. Make sure that both sides of the stepladder are locked into place. Ask someone to hold it for you, if possible. Clearly mark and make sure that you can see: Any grab bars or handrails. First and last steps. Where the edge of each step is. Use tools that help you move around (mobility aids) if they are needed. These include: Canes. Walkers. Scooters. Crutches. Turn on the lights when you go into a dark area. Replace any light bulbs as soon  as they burn out. Set up your furniture so you have a clear path. Avoid moving your furniture around. If any of your floors are uneven, fix them. If there are any pets around you, be aware of where they are. Review your medicines with your doctor. Some medicines can make you feel dizzy. This can increase your chance of falling. Ask your doctor what other things that you can do to help prevent falls. This information is not intended to replace advice given to you by your health care provider. Make sure you discuss any questions you have with your health care provider. Document Released: 01/09/2009 Document Revised: 08/21/2015 Document Reviewed: 04/19/2014 Elsevier Interactive Patient Education  2017 [employer].

## 2023-06-17 ENCOUNTER — Other Ambulatory Visit: Payer: Self-pay | Admitting: Diagnostic Neuroimaging

## 2023-06-22 ENCOUNTER — Telehealth: Payer: Self-pay | Admitting: Diagnostic Neuroimaging

## 2023-06-22 NOTE — Telephone Encounter (Signed)
 Spoke w/Pt regarding issue with pen malfunction. Informed Pt we cannot provided a replacement refill and to call [employer] (gave number) regarding the malfunction as they should be able to offer some assistance or advice regarding the pen malfunction. Pt thankful for call then requested to be transferred to billing. Placed on brief hold and when I returned to transfer call Pt had hung up.

## 2023-06-22 NOTE — Telephone Encounter (Signed)
 Pt asking if can get a replacement refill for Fremanezumab-vfrm (AJOVY) 225 MG/1.5ML SOAJ. asking push down the injector and medicine did not go in my leg. Tried administer again and left in thigh for a minute, lifted it up and the medicine came out. Most of the medicine did not go in my leg. Want to know if can send in another refill to [facility] [phone]

## 2023-06-23 NOTE — Telephone Encounter (Signed)
 The patient called in to check the status of her paperwork for Linzess.

## 2023-06-24 ENCOUNTER — Telehealth: Payer: Self-pay

## 2023-06-24 NOTE — Telephone Encounter (Signed)
 Spoke with Theresa Martin Patient support for Linzess to check status and was asked to send the insurance information- Refaxed completed forms and insurance information. [phone]

## 2023-06-27 ENCOUNTER — Encounter: Payer: Self-pay | Admitting: Diagnostic Neuroimaging

## 2023-06-30 DIAGNOSIS — F3132 Bipolar disorder, current episode depressed, moderate: Secondary | ICD-10-CM | POA: Diagnosis not present

## 2023-06-30 DIAGNOSIS — F411 Generalized anxiety disorder: Secondary | ICD-10-CM | POA: Diagnosis not present

## 2023-07-07 ENCOUNTER — Telehealth: Payer: Self-pay

## 2023-07-07 NOTE — Telephone Encounter (Signed)
 Spoke with patient and Linzess 290 mcg  has been approved -they are in the process of mailing medication to her.

## 2023-07-08 DIAGNOSIS — F411 Generalized anxiety disorder: Secondary | ICD-10-CM | POA: Diagnosis not present

## 2023-07-08 DIAGNOSIS — F3132 Bipolar disorder, current episode depressed, moderate: Secondary | ICD-10-CM | POA: Diagnosis not present

## 2023-07-19 DIAGNOSIS — F411 Generalized anxiety disorder: Secondary | ICD-10-CM | POA: Diagnosis not present

## 2023-07-19 DIAGNOSIS — F3132 Bipolar disorder, current episode depressed, moderate: Secondary | ICD-10-CM | POA: Diagnosis not present

## 2023-07-21 DIAGNOSIS — F319 Bipolar disorder, unspecified: Secondary | ICD-10-CM | POA: Diagnosis not present

## 2023-07-21 DIAGNOSIS — F411 Generalized anxiety disorder: Secondary | ICD-10-CM | POA: Diagnosis not present

## 2023-07-21 DIAGNOSIS — F3132 Bipolar disorder, current episode depressed, moderate: Secondary | ICD-10-CM | POA: Diagnosis not present

## 2023-07-21 DIAGNOSIS — F5104 Psychophysiologic insomnia: Secondary | ICD-10-CM | POA: Diagnosis not present

## 2023-07-25 DIAGNOSIS — L2389 Allergic contact dermatitis due to other agents: Secondary | ICD-10-CM | POA: Diagnosis not present

## 2023-07-25 DIAGNOSIS — L578 Other skin changes due to chronic exposure to nonionizing radiation: Secondary | ICD-10-CM | POA: Diagnosis not present

## 2023-07-25 DIAGNOSIS — L821 Other seborrheic keratosis: Secondary | ICD-10-CM | POA: Diagnosis not present

## 2023-07-25 DIAGNOSIS — L281 Prurigo nodularis: Secondary | ICD-10-CM | POA: Diagnosis not present

## 2023-07-25 DIAGNOSIS — Z86018 Personal history of other benign neoplasm: Secondary | ICD-10-CM | POA: Diagnosis not present

## 2023-07-25 DIAGNOSIS — Z872 Personal history of diseases of the skin and subcutaneous tissue: Secondary | ICD-10-CM | POA: Diagnosis not present

## 2023-07-25 DIAGNOSIS — L0109 Other impetigo: Secondary | ICD-10-CM | POA: Diagnosis not present

## 2023-07-25 DIAGNOSIS — B009 Herpesviral infection, unspecified: Secondary | ICD-10-CM | POA: Diagnosis not present

## 2023-07-28 ENCOUNTER — Telehealth: Payer: Self-pay | Admitting: Diagnostic Neuroimaging

## 2023-07-28 ENCOUNTER — Telehealth: Payer: Self-pay | Admitting: Neurology

## 2023-07-28 MED ORDER — NURTEC 75 MG PO TBDP: 75.0000 mg | ORAL_TABLET | ORAL | 8 refills | Status: DC | PRN

## 2023-07-28 NOTE — Telephone Encounter (Signed)
 I have sent the refill to the pharmacy for the patient.

## 2023-07-28 NOTE — Telephone Encounter (Signed)
 Pt requesting refill of Rimegepant Sulfate  (NURTEC) 75 MG TBDP  at [facility] [phone]

## 2023-07-28 NOTE — Telephone Encounter (Signed)
 Pt brought the [employer] sorms in today. Her portion completed along with information needed to be submitted. Dr Salli Crawley signed the provider portion and paperwork has been faxed in for the patient. Received confirmation.

## 2023-08-01 ENCOUNTER — Telehealth: Payer: Self-pay | Admitting: Diagnostic Neuroimaging

## 2023-08-01 NOTE — Telephone Encounter (Signed)
 Completed the information left blank on pt form. Resent to [employer] and received fax confirmation.

## 2023-08-01 NOTE — Telephone Encounter (Signed)
 [employer] calling to inform you financial assistance progtam paperwork for this patient is missing information. Request is for Ajovy  and they advised page 1 patient portion is missing and can be completed then faxed to [phone]. If you have further questions please call [phone]

## 2023-08-04 DIAGNOSIS — F411 Generalized anxiety disorder: Secondary | ICD-10-CM | POA: Diagnosis not present

## 2023-08-04 DIAGNOSIS — F3132 Bipolar disorder, current episode depressed, moderate: Secondary | ICD-10-CM | POA: Diagnosis not present

## 2023-08-11 NOTE — Progress Notes (Deleted)
 se     Brigitte Canard, PA-C [address] [place], [place]  [address] Phone: [phone]   Primary Care Physician: Jess Morita, MD  Primary Gastroenterologist:  Brigitte Canard, PA-C / ***  Chief Complaint: Follow-up abdominal pain and constipation       HPI:   Theresa Martin is a 68 y.o. female returns for follow-up of chronic constipation, GERD, and erosive gastritis (H. pylori negative).  She continues taking pantoprazole  40 Mg daily, famotidine 20 Mg twice daily, and Linzess  to 90 mcg once daily.  Previously she had been taking a lot of ibuprofen  for migraine headaches and discontinued ibuprofen  and all NSAIDs many months ago.  Current symptoms: She has had issues with abdominal swelling and bloating.  Has been taking Linzess  290 and Senokot.  EGD done 09/02/2022 by Dr. Baldomero Bone showed multiple small gastric erosions (erosive gastropathy).  Normal esophagus and duodenum.  Biopsies negative for celiac and H. pylori.   Screening colonoscopy done 09/02/2022 showed no polyps.  No biopsies.  5-year repeat due to family history of her brother who had colon cancer.  Current Outpatient Medications  Medication Sig Dispense Refill   acetaminophen  (TYLENOL ) 325 MG tablet 1 tablet as needed Orally every 6 hrs     acyclovir  (ZOVIRAX ) 800 MG tablet Take 800 mg by mouth 3 (three) times daily.     atorvastatin  (LIPITOR) 20 MG tablet TAKE 1 TABLET BY MOUTH EVERY DAY 90 tablet 1   budesonide -formoterol  (SYMBICORT ) 160-4.5 MCG/ACT inhaler 1 puff as needed Inhalation every 4 hrs     buPROPion (WELLBUTRIN) 100 MG tablet Take 100 mg by mouth daily at 2 PM.     Cholecalciferol  (VITAMIN D3 PO) Take 5,000 Units by mouth.     clonazePAM  (KLONOPIN ) 0.5 MG tablet Take 0.5 mg by mouth daily as needed.     diclofenac Sodium (VOLTAREN) 1 % GEL Apply 1 Application topically 4 (four) times daily as needed.     ELDERBERRY PO Take by mouth.     eletriptan  (RELPAX ) 40 MG tablet 1 tablet Orally Once a day      escitalopram  (LEXAPRO ) 10 MG tablet 1 tablet Orally Once a day     famotidine (PEPCID) 20 MG tablet Take 20 mg by mouth 2 (two) times daily.     Fremanezumab -vfrm (AJOVY ) 225 MG/1.5ML SOAJ Inject 225 mg into the skin every 30 (thirty) days. 4.5 mL 4   Lactulose  20 GM/30ML SOLN Take 30 mLs (20 g total) by mouth 3 (three) times daily as needed. 900 mL 5   latanoprost  (XALATAN ) 0.005 % ophthalmic solution Place 1 drop into both eyes at bedtime. 2.5 mL 12   linaclotide  (LINZESS ) 290 MCG CAPS capsule Take 1 capsule (290 mcg total) by mouth daily before breakfast. 30 capsule 5   meloxicam  (MOBIC ) 15 MG tablet 1 tablet Orally Once a day     methylPREDNISolone  (MEDROL ) 4 MG tablet 1 tablet with food or milk Orally every 12 hrs     Misc Natural Products (JOINT HEALTH PO) Take by mouth.     montelukast  (SINGULAIR ) 10 MG tablet 1 tablet Orally Once a day     Multiple Vitamins-Minerals (WOMENS MULTIVITAMIN PO) Take by mouth.     pantoprazole  (PROTONIX ) 40 MG tablet Take 1 tablet (40 mg total) by mouth daily. 30 tablet 5   Probiotic Product (PROBIOTIC PO) Take by mouth.     QUEtiapine  (SEROQUEL  XR) 400 MG 24 hr tablet Take 1 tablet (400 mg total) by mouth  at bedtime. (Patient taking differently: Take 800 mg by mouth at bedtime.) 30 tablet 1   Rimegepant Sulfate  (NURTEC) 75 MG TBDP Take 1 tablet (75 mg total) by mouth as needed (for breakthrough migraine). 8 tablet 8   rizatriptan  (MAXALT -MLT) 10 MG disintegrating tablet Take 1 tablet (10 mg total) by mouth as needed for migraine (May repeat in 2 hours if needed). 10 tablet 12   Sennosides (SENOKOT PO) Take 20 mg by mouth in the morning and at bedtime.     temazepam  (RESTORIL ) 15 MG capsule Take 1 capsule (15 mg total) by mouth at bedtime. 30 capsule 0   triamterene -hydrochlorothiazide  (MAXZIDE -25) 37.5-25 MG tablet TAKE 1 TABLET BY MOUTH EVERY DAY 90 tablet 1   Turmeric (QC TUMERIC COMPLEX) 500 MG CAPS Take by mouth.     UNABLE TO FIND Take 2 tablets by  mouth at bedtime. Med Name: magnesium  L theronate  650 mg at HS     zinc gluconate 50 MG tablet Take 50 mg by mouth daily.     No current facility-administered medications for this visit.    Allergies as of 08/12/2023   (No Known Allergies)    Past Medical History:  Diagnosis Date   Allergy    Anxiety    Asthma    Depression    Hyperlipidemia    Hypertension    Migraines    Osteopenia after menopause    Persistent headaches    Pneumonia 2020   Postherpetic neuralgia    Renal disorder    Shingles outbreak 02/2014   Skin cancer    moles on right groin and right buttock.   Slurred speech 09/09/2022    Past Surgical History:  Procedure Laterality Date   CARDIOVASCULAR STRESS TEST  02/2009   treadmill stress test: Low risk   CESAREAN SECTION  1987, 1989   COLONOSCOPY     2014/2015 [facility], normal   COLONOSCOPY WITH PROPOFOL  N/A 10/20/2021   Procedure: COLONOSCOPY WITH PROPOFOL ;  Surgeon: Luke Salaam, MD;  Location: [facility] ENDOSCOPY;  Service: Gastroenterology;  Laterality: N/A;   COLONOSCOPY WITH PROPOFOL  N/A 09/02/2022   Procedure: COLONOSCOPY WITH PROPOFOL ;  Surgeon: Selena Daily, MD;  Location: [facility] ENDOSCOPY;  Service: Gastroenterology;  Laterality: N/A;  Requests about 11:15 arrival   DIAGNOSTIC LAPAROSCOPY     ESOPHAGOGASTRODUODENOSCOPY (EGD) WITH PROPOFOL  N/A 09/02/2022   Procedure: ESOPHAGOGASTRODUODENOSCOPY (EGD) WITH PROPOFOL ;  Surgeon: Selena Daily, MD;  Location: [facility] ENDOSCOPY;  Service: Gastroenterology;  Laterality: N/A;   EXPLORATORY LAPAROTOMY     laproscopy for infertility   EYE SURGERY Bilateral    laser-correct opening b/tn cornea and iris   MOLE REMOVAL  2017   x 2 moles    Review of Systems:    All systems reviewed and negative except where noted in HPI.    Physical Exam:  There were no vitals taken for this visit. No LMP recorded. Patient is postmenopausal.  General: Well-nourished, well-developed in no acute distress.  Lungs:  Clear to auscultation bilaterally. Non-labored. Heart: Regular rate and rhythm, no murmurs rubs or gallops.  Abdomen: Bowel sounds are normal; Abdomen is Soft; No hepatosplenomegaly, masses or hernias;  No Abdominal Tenderness; No guarding or rebound tenderness. Neuro: Alert and oriented x 3.  Grossly intact.  Psych: Alert and cooperative, normal mood and affect.   Imaging Studies: No results found.  Labs: CBC    Component Value Date/Time   WBC 5.2 05/30/2023 1346   WBC 13.1 (H) 04/13/2023 1330   RBC 4.28  05/30/2023 1346   RBC 4.29 04/13/2023 1330   HGB 12.9 05/30/2023 1346   HCT 38.0 05/30/2023 1346   PLT 273 05/30/2023 1346   MCV 89 05/30/2023 1346   MCH 30.1 05/30/2023 1346   MCH 30.8 09/09/2022 1300   MCHC 33.9 05/30/2023 1346   MCHC 32.5 04/13/2023 1330   RDW 11.6 (L) 05/30/2023 1346   LYMPHSABS 1.1 05/30/2023 1346   MONOABS 0.8 04/13/2023 1330   EOSABS 0.0 05/30/2023 1346   BASOSABS 0.0 05/30/2023 1346    CMP     Component Value Date/Time   NA 137 04/13/2023 1330   NA 140 12/10/2020 0954   K 4.2 04/13/2023 1330   CL 101 04/13/2023 1330   CO2 29 04/13/2023 1330   GLUCOSE 84 04/13/2023 1330   BUN 16 04/13/2023 1330   BUN 18 12/10/2020 0954   CREATININE 0.68 04/13/2023 1330   CREATININE 0.90 06/08/2017 1534   CALCIUM  9.5 04/13/2023 1330   PROT 7.7 04/13/2023 1330   PROT 6.8 12/10/2020 0954   ALBUMIN 4.6 04/13/2023 1330   ALBUMIN 4.6 12/10/2020 0954   AST 29 04/13/2023 1330   ALT 30 04/13/2023 1330   ALKPHOS 83 04/13/2023 1330   BILITOT 0.3 04/13/2023 1330   BILITOT <0.2 12/10/2020 0954   GFRNONAA >60 09/09/2022 1300   GFRAA >60 02/11/2019 4098       Assessment and Plan:   Theresa Martin is a 68 y.o. y/o female returns for follow-up of:   1.  Erosive gastritis             Continue pantoprazole  40 Mg daily.             Avoid NSAIDs.   2.  GERD             GERD diet.               Start OTC famotidine 20 Mg twice daily.   3.  Chronic  constipation             Continue Linzess  290 mcg once daily.             Add OTC Colace stool softener 100 mg once daily.             Continue high-fiber diet and 64 ounces of water /fluids daily.   4.  Family history of colon cancer -brother             Repeat colonoscopy in 5 years (08/2027).    Brigitte Canard, PA-C  Follow up ***

## 2023-08-12 ENCOUNTER — Ambulatory Visit: Admitting: Physician Assistant

## 2023-08-12 DIAGNOSIS — M9906 Segmental and somatic dysfunction of lower extremity: Secondary | ICD-10-CM | POA: Diagnosis not present

## 2023-08-12 DIAGNOSIS — M9903 Segmental and somatic dysfunction of lumbar region: Secondary | ICD-10-CM | POA: Diagnosis not present

## 2023-08-12 DIAGNOSIS — M546 Pain in thoracic spine: Secondary | ICD-10-CM | POA: Diagnosis not present

## 2023-08-12 DIAGNOSIS — M9904 Segmental and somatic dysfunction of sacral region: Secondary | ICD-10-CM | POA: Diagnosis not present

## 2023-08-12 DIAGNOSIS — M25551 Pain in right hip: Secondary | ICD-10-CM | POA: Diagnosis not present

## 2023-08-12 DIAGNOSIS — M7918 Myalgia, other site: Secondary | ICD-10-CM | POA: Diagnosis not present

## 2023-08-12 DIAGNOSIS — M9902 Segmental and somatic dysfunction of thoracic region: Secondary | ICD-10-CM | POA: Diagnosis not present

## 2023-08-12 DIAGNOSIS — R600 Localized edema: Secondary | ICD-10-CM | POA: Diagnosis not present

## 2023-08-12 DIAGNOSIS — M9908 Segmental and somatic dysfunction of rib cage: Secondary | ICD-10-CM | POA: Diagnosis not present

## 2023-08-12 DIAGNOSIS — M5459 Other low back pain: Secondary | ICD-10-CM | POA: Diagnosis not present

## 2023-08-16 DIAGNOSIS — F411 Generalized anxiety disorder: Secondary | ICD-10-CM | POA: Diagnosis not present

## 2023-08-16 DIAGNOSIS — F3132 Bipolar disorder, current episode depressed, moderate: Secondary | ICD-10-CM | POA: Diagnosis not present

## 2023-08-25 DIAGNOSIS — F411 Generalized anxiety disorder: Secondary | ICD-10-CM | POA: Diagnosis not present

## 2023-08-25 DIAGNOSIS — F5104 Psychophysiologic insomnia: Secondary | ICD-10-CM | POA: Diagnosis not present

## 2023-08-25 DIAGNOSIS — F319 Bipolar disorder, unspecified: Secondary | ICD-10-CM | POA: Diagnosis not present

## 2023-08-31 ENCOUNTER — Encounter: Payer: Self-pay | Admitting: Physician Assistant

## 2023-08-31 ENCOUNTER — Ambulatory Visit (INDEPENDENT_AMBULATORY_CARE_PROVIDER_SITE_OTHER): Admitting: Physician Assistant

## 2023-08-31 ENCOUNTER — Other Ambulatory Visit (INDEPENDENT_AMBULATORY_CARE_PROVIDER_SITE_OTHER)

## 2023-08-31 VITALS — BP 112/72 | HR 72 | Ht 61.0 in | Wt 123.0 lb

## 2023-08-31 DIAGNOSIS — K2930 Chronic superficial gastritis without bleeding: Secondary | ICD-10-CM

## 2023-08-31 DIAGNOSIS — R1900 Intra-abdominal and pelvic swelling, mass and lump, unspecified site: Secondary | ICD-10-CM

## 2023-08-31 DIAGNOSIS — Z800 Family history of malignant neoplasm of digestive organs: Secondary | ICD-10-CM | POA: Diagnosis not present

## 2023-08-31 DIAGNOSIS — K2970 Gastritis, unspecified, without bleeding: Secondary | ICD-10-CM

## 2023-08-31 DIAGNOSIS — K2100 Gastro-esophageal reflux disease with esophagitis, without bleeding: Secondary | ICD-10-CM | POA: Diagnosis not present

## 2023-08-31 DIAGNOSIS — K5909 Other constipation: Secondary | ICD-10-CM

## 2023-08-31 DIAGNOSIS — R1084 Generalized abdominal pain: Secondary | ICD-10-CM

## 2023-08-31 LAB — COMPREHENSIVE METABOLIC PANEL WITH GFR
ALT: 20 U/L (ref 0–35)
AST: 23 U/L (ref 0–37)
Albumin: 4.4 g/dL (ref 3.5–5.2)
Alkaline Phosphatase: 73 U/L (ref 39–117)
BUN: 19 mg/dL (ref 6–23)
CO2: 30 meq/L (ref 19–32)
Calcium: 9 mg/dL (ref 8.4–10.5)
Chloride: 98 meq/L (ref 96–112)
Creatinine, Ser: 0.66 mg/dL (ref 0.40–1.20)
GFR: 89.98 mL/min (ref >60.00)
Glucose, Bld: 95 mg/dL (ref 70–99)
Potassium: 4.2 meq/L (ref 3.5–5.1)
Sodium: 133 meq/L — ABNORMAL LOW (ref 135–145)
Total Bilirubin: 0.3 mg/dL (ref 0.2–1.2)
Total Protein: 7.3 g/dL (ref 6.0–8.3)

## 2023-08-31 LAB — CBC WITH DIFFERENTIAL/PLATELET
Basophils Absolute: 0 10*3/uL (ref 0.0–0.1)
Basophils Relative: 0.5 % (ref 0.0–3.0)
Eosinophils Absolute: 0 10*3/uL (ref 0.0–0.7)
Eosinophils Relative: 0.4 % (ref 0.0–5.0)
HCT: 38.3 % (ref 36.0–46.0)
Hemoglobin: 12.7 g/dL (ref 12.0–15.0)
Lymphocytes Relative: 24.3 % (ref 12.0–46.0)
Lymphs Abs: 1.5 10*3/uL (ref 0.7–4.0)
MCHC: 33.2 g/dL (ref 30.0–36.0)
MCV: 89.7 fl (ref 78.0–100.0)
Monocytes Absolute: 0.5 10*3/uL (ref 0.1–1.0)
Monocytes Relative: 7.8 % (ref 3.0–12.0)
Neutro Abs: 4.1 10*3/uL (ref 1.4–7.7)
Neutrophils Relative %: 67 % (ref 43.0–77.0)
Platelets: 266 10*3/uL (ref 150.0–400.0)
RBC: 4.27 Mil/uL (ref 3.87–5.11)
RDW: 12.7 % (ref 11.5–15.5)
WBC: 6.1 10*3/uL (ref 4.0–10.5)

## 2023-08-31 LAB — LIPASE: Lipase: 27 U/L (ref 11.0–59.0)

## 2023-08-31 NOTE — Progress Notes (Signed)
 [address]     Brigitte Canard, PA-C [address] [place], [place]  [address] Phone: [phone]   Primary Care Physician: Jess Morita, MD  Primary Gastroenterologist:  Brigitte Canard, PA-C / Legrand Puma, MD   Chief Complaint: F/U chronic constipation, GERD, esophagitis       HPI:   Theresa Martin is a 68 y.o. female returns for follow-up of chronic constipation.  I last saw patient 09/2022 at [facility] gastroenterology in [place].  She is taking Linzess  290 mcg once daily, and senna-S 2 capsules twice daily.  This is not controlling her constipation.  We called in lactulose , however she has not yet started that.  She is having bowel movement every 2 or 3 days.  Has small hard balls of stool with straining.  Sometimes has a large normal bowel movement.  She reports having a lot of generalized abdominal swelling and bloating.  Feels very full.  Has had 5 pound weight gain which is all in her abdomen.  No previous abdominal or pelvic surgeries.  Also takes pantoprazole  40 Mg daily for GERD and history of erosive esophagitis.  She is avoiding NSAIDs.  She denies any heartburn, dysphagia, or upper GI symptoms.  EGD done 09/02/2022 by Dr. Baldomero Bone showed multiple small gastric erosions (erosive gastropathy).  Normal esophagus and duodenum.  Biopsies negative for celiac and H. pylori.   Screening colonoscopy done 09/02/2022 showed no polyps.  No biopsies.  5-year repeat due to family history of her brother who had colon cancer.  Current Outpatient Medications  Medication Sig Dispense Refill   acetaminophen  (TYLENOL ) 325 MG tablet 1 tablet as needed Orally every 6 hrs     atorvastatin  (LIPITOR) 20 MG tablet TAKE 1 TABLET BY MOUTH EVERY DAY 90 tablet 1   Cholecalciferol  (VITAMIN D3 PO) Take 5,000 Units by mouth.     clonazePAM  (KLONOPIN ) 0.5 MG tablet Take 0.5 mg by mouth daily as needed.     diclofenac Sodium (VOLTAREN) 1 % GEL Apply 1 Application topically 4 (four) times daily as needed.      ELDERBERRY PO Take by mouth.     escitalopram  (LEXAPRO ) 10 MG tablet 1 tablet Orally Once a day     famotidine (PEPCID) 20 MG tablet Take 20 mg by mouth 2 (two) times daily.     Fremanezumab -vfrm (AJOVY ) 225 MG/1.5ML SOAJ Inject 225 mg into the skin every 30 (thirty) days. 4.5 mL 4   latanoprost  (XALATAN ) 0.005 % ophthalmic solution Place 1 drop into both eyes at bedtime. 2.5 mL 12   linaclotide  (LINZESS ) 290 MCG CAPS capsule Take 1 capsule (290 mcg total) by mouth daily before breakfast. 30 capsule 5   Misc Natural Products (JOINT HEALTH PO) Take by mouth.     Multiple Vitamins-Minerals (WOMENS MULTIVITAMIN PO) Take by mouth.     pantoprazole  (PROTONIX ) 40 MG tablet Take 1 tablet (40 mg total) by mouth daily. 30 tablet 5   Probiotic Product (PROBIOTIC PO) Take by mouth.     QUEtiapine  (SEROQUEL  XR) 400 MG 24 hr tablet Take 1 tablet (400 mg total) by mouth at bedtime. (Patient taking differently: Take 800 mg by mouth at bedtime.) 30 tablet 1   Rimegepant Sulfate  (NURTEC) 75 MG TBDP Take 1 tablet (75 mg total) by mouth as needed (for breakthrough migraine). 8 tablet 8   rizatriptan  (MAXALT -MLT) 10 MG disintegrating tablet Take 1 tablet (10 mg total) by mouth as needed for migraine (May repeat in 2 hours if needed). 10 tablet  12   Sennosides (SENOKOT PO) Take 20 mg by mouth in the morning and at bedtime.     temazepam  (RESTORIL ) 15 MG capsule Take 1 capsule (15 mg total) by mouth at bedtime. 30 capsule 0   triamterene -hydrochlorothiazide  (MAXZIDE -25) 37.5-25 MG tablet TAKE 1 TABLET BY MOUTH EVERY DAY 90 tablet 1   Turmeric (QC TUMERIC COMPLEX) 500 MG CAPS Take by mouth.     UNABLE TO FIND Take 2 tablets by mouth at bedtime. Med Name: magnesium  L theronate  650 mg at HS     zinc gluconate 50 MG tablet Take 50 mg by mouth daily.     acyclovir  (ZOVIRAX ) 800 MG tablet Take 800 mg by mouth 3 (three) times daily.     budesonide -formoterol  (SYMBICORT ) 160-4.5 MCG/ACT inhaler 1 puff as needed Inhalation  every 4 hrs     buPROPion (WELLBUTRIN) 100 MG tablet Take 100 mg by mouth daily at 2 PM.     eletriptan  (RELPAX ) 40 MG tablet 1 tablet Orally Once a day     Lactulose  20 GM/30ML SOLN Take 30 mLs (20 g total) by mouth 3 (three) times daily as needed. 900 mL 5   meloxicam  (MOBIC ) 15 MG tablet 1 tablet Orally Once a day     methylPREDNISolone  (MEDROL ) 4 MG tablet 1 tablet with food or milk Orally every 12 hrs     montelukast  (SINGULAIR ) 10 MG tablet 1 tablet Orally Once a day     No current facility-administered medications for this visit.    Allergies as of 08/31/2023   (No Known Allergies)    Past Medical History:  Diagnosis Date   Allergy    Anxiety    Asthma    Depression    Hyperlipidemia    Hypertension    Migraines    Osteopenia after menopause    Persistent headaches    Pneumonia 2020   Postherpetic neuralgia    Renal disorder    Shingles outbreak 02/2014   Skin cancer    moles on right groin and right buttock.   Slurred speech 09/09/2022    Past Surgical History:  Procedure Laterality Date   CARDIOVASCULAR STRESS TEST  02/2009   treadmill stress test: Low risk   CESAREAN SECTION  1987, 1989   COLONOSCOPY     2014/2015 [facility], normal   COLONOSCOPY WITH PROPOFOL  N/A 10/20/2021   Procedure: COLONOSCOPY WITH PROPOFOL ;  Surgeon: Luke Salaam, MD;  Location[facility] ENDOSCOPY;  Service: Gastroenterology;  Laterality: N/A;   COLONOSCOPY WITH PROPOFOL  N/A 09/02/2022   Procedure: COLONOSCOPY WITH PROPOFOL ;  Surgeon: Selena Daily, MD;  Location: [facility] ENDOSCOPY;  Service: Gastroenterology;  Laterality: N/A;  Requests about 11:15 arrival   DIAGNOSTIC LAPAROSCOPY     ESOPHAGOGASTRODUODENOSCOPY (EGD) WITH PROPOFOL  N/A 09/02/2022   Procedure: ESOPHAGOGASTRODUODENOSCOPY (EGD) WITH PROPOFOL ;  Surgeon: Selena Daily, MD;  Location: [facility] ENDOSCOPY;  Service: Gastroenterology;  Laterality: N/A;   EXPLORATORY LAPAROTOMY     laproscopy for infertility   EYE SURGERY  Bilateral    laser-correct opening b/tn cornea and iris   MOLE REMOVAL  2017   x 2 moles    Review of Systems:    All systems reviewed and negative except where noted in HPI.    Physical Exam:  BP 112/72   Pulse 72   Ht 5\' 1"  (1.549 m)   Wt 123 lb (55.8 kg)   BMI 23.24 kg/m  No LMP recorded. Patient is postmenopausal.  General: Well-nourished, well-developed in no acute distress.  Lungs: Clear to auscultation bilaterally. Non-labored. Heart: Regular rate and rhythm, no murmurs rubs or gallops.  Abdomen: Bowel sounds are normal; Abdomen is very distended diffusely. No hepatosplenomegaly, masses or hernias; there is mild generalized upper and lower abdominal Tenderness; No guarding or rebound tenderness. Neuro: Alert and oriented x 3.  Grossly intact.  Psych: Alert and cooperative, normal mood and affect.  Imaging Studies: No results found.  Labs: CBC    Component Value Date/Time   WBC 5.2 05/30/2023 1346   WBC 13.1 (H) 04/13/2023 1330   RBC 4.28 05/30/2023 1346   RBC 4.29 04/13/2023 1330   HGB 12.9 05/30/2023 1346   HCT 38.0 05/30/2023 1346   PLT 273 05/30/2023 1346   MCV 89 05/30/2023 1346   MCH 30.1 05/30/2023 1346   MCH 30.8 09/09/2022 1300   MCHC 33.9 05/30/2023 1346   MCHC 32.5 04/13/2023 1330   RDW 11.6 (L) 05/30/2023 1346   LYMPHSABS 1.1 05/30/2023 1346   MONOABS 0.8 04/13/2023 1330   EOSABS 0.0 05/30/2023 1346   BASOSABS 0.0 05/30/2023 1346    CMP     Component Value Date/Time   NA 137 04/13/2023 1330   NA 140 12/10/2020 0954   K 4.2 04/13/2023 1330   CL 101 04/13/2023 1330   CO2 29 04/13/2023 1330   GLUCOSE 84 04/13/2023 1330   BUN 16 04/13/2023 1330   BUN 18 12/10/2020 0954   CREATININE 0.68 04/13/2023 1330   CREATININE 0.90 06/08/2017 1534   CALCIUM  9.5 04/13/2023 1330   PROT 7.7 04/13/2023 1330   PROT 6.8 12/10/2020 0954   ALBUMIN 4.6 04/13/2023 1330   ALBUMIN 4.6 12/10/2020 0954   AST 29 04/13/2023 1330   ALT 30 04/13/2023 1330    ALKPHOS 83 04/13/2023 1330   BILITOT 0.3 04/13/2023 1330   BILITOT <0.2 12/10/2020 0954   GFRNONAA >60 09/09/2022 1300   GFRAA >60 02/11/2019 1027       Assessment and Plan:   JAYLEN CLAUDE is a 68 y.o. y/o female returns for follow-up of:  1.  Chronic constipation - Continue Linzess  290 MCG 1 tablet once daily. - Continue senna S (docusate sodium /senna 50/8.6 Mg) 2 capsules twice daily. - Start lactulose  30 mL 1-3 times daily. - Continue high-fiber diet and drinking 64 ounces of fluids daily.  2. Generalized abd pain and swelling - Labs:  CBC, CMP, Lipase  - CT abd / pelvis with contrast: Evaluate for intra-abdominal neoplasm.  2.  GERD with esophagitis - Continue pantoprazole  40 Mg daily. - Continue famotidine 20 Mg twice daily - Continue GERD diet.  3.  History of erosive gastritis due to NSAIDs (H. pylori negative) - Avoid NSAIDs - Continue PPI  4.  Family history of colon cancer in her brother - Patient had negative colonoscopy 08/2022. - 5-year repeat colonoscopy will be due 08/2027.   Brigitte Canard, PA-C  Follow up in 4 weeks with TG.

## 2023-08-31 NOTE — Patient Instructions (Signed)
 Your provider has requested that you go to the basement level for lab work before leaving today. Press "B" on the elevator. The lab is located at the first door on the left as you exit the elevator.  Continue current medications.  You have been scheduled for a CT scan of the abdomen and pelvis at [facility] outpatient imaging ([address] You are scheduled on 09/01/23 at 9:15 arrival.    Please follow the written instructions below on the day of your exam:   1) Do not eat anything after 5:30am (4 hours prior to your test)    You may take any medications as prescribed with a small amount of water , if necessary. If you take any of the following medications: METFORMIN, GLUCOPHAGE, GLUCOVANCE, AVANDAMET, RIOMET, FORTAMET, ACTOPLUS MET, JANUMET, GLUMETZA or METAGLIP, you MAY be asked to HOLD this medication 48 hours AFTER the exam.   The purpose of you drinking the oral contrast is to aid in the visualization of your intestinal tract. The contrast solution may cause some diarrhea. Depending on your individual set of symptoms, you may also receive an intravenous injection of x-ray contrast/dye. Plan on being at [facility] for 45 minutes or longer, depending on the type of exam you are having performed.   If you have any questions regarding your exam or if you need to reschedule, you may call [facility] at [phone] between the hours of 8:00 am and 5:00 pm, Monday-Friday.   _______________________________________________________  If your blood pressure at your visit was 140/90 or greater, please contact your primary care physician to follow up on this.  _______________________________________________________  If you are age 7 or older, your body mass index should be between 23-30. Your Body mass index is 23.24 kg/m. If this is out of the aforementioned range listed, please consider follow up with your Primary Care Provider.  If you are age 87 or younger, your body mass  index should be between 19-25. Your Body mass index is 23.24 kg/m. If this is out of the aformentioned range listed, please consider follow up with your Primary Care Provider.   ________________________________________________________  The [facility] providers would like to encourage you to use MYCHART to communicate with providers for non-urgent requests or questions.  Due to long hold times on the telephone, sending your provider a message by [facility] may be a faster and more efficient way to get a response.  Please allow 48 business hours for a response.  Please remember that this is for non-urgent requests.  _______________________________________________________

## 2023-09-01 ENCOUNTER — Ambulatory Visit: Payer: Self-pay | Admitting: Physician Assistant

## 2023-09-01 ENCOUNTER — Ambulatory Visit
Admission: RE | Admit: 2023-09-01 | Discharge: 2023-09-01 | Disposition: A | Discharge status: Home / Self Care | Source: Ambulatory Visit | Attending: Physician Assistant | Admitting: Physician Assistant

## 2023-09-01 DIAGNOSIS — F411 Generalized anxiety disorder: Secondary | ICD-10-CM | POA: Diagnosis not present

## 2023-09-01 DIAGNOSIS — K5909 Other constipation: Secondary | ICD-10-CM | POA: Diagnosis not present

## 2023-09-01 DIAGNOSIS — K2930 Chronic superficial gastritis without bleeding: Secondary | ICD-10-CM | POA: Diagnosis not present

## 2023-09-01 DIAGNOSIS — F3132 Bipolar disorder, current episode depressed, moderate: Secondary | ICD-10-CM | POA: Diagnosis not present

## 2023-09-01 DIAGNOSIS — R1084 Generalized abdominal pain: Secondary | ICD-10-CM | POA: Diagnosis not present

## 2023-09-01 DIAGNOSIS — R1900 Intra-abdominal and pelvic swelling, mass and lump, unspecified site: Secondary | ICD-10-CM | POA: Diagnosis not present

## 2023-09-01 MED ORDER — IOHEXOL 300 MG/ML  SOLN
85.0000 mL | Freq: Once | INTRAMUSCULAR | Status: AC | PRN
  Administered 2023-09-01: 85 mL via INTRAVENOUS

## 2023-09-02 ENCOUNTER — Encounter: Payer: Self-pay | Admitting: Diagnostic Neuroimaging

## 2023-09-02 ENCOUNTER — Other Ambulatory Visit: Payer: Self-pay

## 2023-09-02 MED ORDER — LACTULOSE 10 GM/15ML PO SOLN: 30.0000 g | Freq: Two times a day (BID) | ORAL | 5 refills | Status: AC

## 2023-09-02 NOTE — Progress Notes (Signed)
 Theresa Martin

## 2023-09-03 ENCOUNTER — Other Ambulatory Visit: Payer: Self-pay | Admitting: Family Medicine

## 2023-09-03 DIAGNOSIS — E785 Hyperlipidemia, unspecified: Secondary | ICD-10-CM

## 2023-09-05 ENCOUNTER — Telehealth: Payer: Self-pay

## 2023-09-05 ENCOUNTER — Telehealth: Payer: Self-pay | Admitting: Diagnostic Neuroimaging

## 2023-09-05 ENCOUNTER — Other Ambulatory Visit (HOSPITAL_COMMUNITY): Payer: Self-pay

## 2023-09-05 NOTE — Telephone Encounter (Signed)
 Pt is in need of PA. Will sent ot PA team to work on for the pt

## 2023-09-05 NOTE — Telephone Encounter (Signed)
 Replied to patient's mychart. Refill was sent in for the pt in January for 3 mth supply with refills to last year.

## 2023-09-05 NOTE — Telephone Encounter (Signed)
 Pt has been informed by pharmacy that a PA is needed for her  Fremanezumab -vfrm (AJOVY ) 225 MG/1.5ML SOAJ

## 2023-09-05 NOTE — Telephone Encounter (Signed)
 Pharmacy Patient Advocate Encounter   Received notification from Physician's Office that prior authorization for AJOVY  (fremanezumab -vfrm) injection 225MG /1.5ML auto-injectors is required/requested.   Insurance verification completed.   The patient is insured through [facility] .   Per test claim: PA required; PA submitted to above mentioned insurance via CoverMyMeds Key/confirmation #/EOC [id number] Status is pending

## 2023-09-05 NOTE — Telephone Encounter (Signed)
 Patient request refill for Fremanezumab -vfrm (AJOVY ) 225 MG/1.5ML SOAJ [facility] [phone]

## 2023-09-08 ENCOUNTER — Other Ambulatory Visit: Payer: Self-pay

## 2023-09-08 MED ORDER — DICYCLOMINE HCL 10 MG PO CAPS
10.0000 mg | ORAL_CAPSULE | Freq: Three times a day (TID) | ORAL | 1 refills | Status: AC
Start: 2023-09-08 — End: ?

## 2023-09-09 ENCOUNTER — Other Ambulatory Visit (HOSPITAL_COMMUNITY): Payer: Self-pay

## 2023-09-09 NOTE — Telephone Encounter (Signed)
 Pharmacy Patient Advocate Encounter  Received notification from [facility] that Prior Authorization for AJOVY  (fremanezumab -vfrm) injection 225MG /1.5ML auto-injectors has been APPROVED from 09/05/2023 to 09/04/2024. Unable to obtain price due to refill too soon rejection, last fill date 09/05/2023 next available fill date6/30/2025   PA #/Case ID/Reference #: PA Case ID #: [id number]

## 2023-09-20 DIAGNOSIS — F411 Generalized anxiety disorder: Secondary | ICD-10-CM | POA: Diagnosis not present

## 2023-09-20 DIAGNOSIS — F3132 Bipolar disorder, current episode depressed, moderate: Secondary | ICD-10-CM | POA: Diagnosis not present

## 2023-09-23 ENCOUNTER — Telehealth: Payer: Self-pay

## 2023-09-23 NOTE — Telephone Encounter (Signed)
 Patient called office -she wanted to ask about probiotics.We discussed trying Align or Restora and shewill try this.She stated she has been taking one of the cheapest and doesn't feel its helping. She also stated she has been trying the Lactulose  and it causes abdominal pain so she has decided to stop using it. She is continuing to take the Linzess  and she had a bowel  movement yesterday and she is going about every 4-5 days and is continuing to have bloating and very gassy. I provided [facility] phone number and since she has recently seen Ellouise there maybe she could schedule an appointment.

## 2023-10-02 NOTE — Progress Notes (Deleted)
 Ellouise Console, PA-C [address] [place], [place]  [address] Phone: [phone]   Primary Care Physician: Mahlon Comer BRAVO, MD  Primary Gastroenterologist:  Ellouise Console, PA-C / ***  Chief Complaint: Follow-up abdominal pain, constipation       HPI:   Theresa Martin is a 68 y.o. female  Current Outpatient Medications  Medication Sig Dispense Refill   dicyclomine  (BENTYL ) 10 MG capsule Take 1 capsule (10 mg total) by mouth 3 (three) times daily before meals. 90 capsule 1   acetaminophen  (TYLENOL ) 325 MG tablet 1 tablet as needed Orally every 6 hrs     acyclovir  (ZOVIRAX ) 800 MG tablet Take 800 mg by mouth 3 (three) times daily.     atorvastatin  (LIPITOR) 20 MG tablet TAKE 1 TABLET BY MOUTH EVERY DAY 90 tablet 1   budesonide -formoterol  (SYMBICORT ) 160-4.5 MCG/ACT inhaler 1 puff as needed Inhalation every 4 hrs     buPROPion (WELLBUTRIN) 100 MG tablet Take 100 mg by mouth daily at 2 PM.     Cholecalciferol  (VITAMIN D3 PO) Take 5,000 Units by mouth.     clonazePAM  (KLONOPIN ) 0.5 MG tablet Take 0.5 mg by mouth daily as needed.     diclofenac Sodium (VOLTAREN) 1 % GEL Apply 1 Application topically 4 (four) times daily as needed.     ELDERBERRY PO Take by mouth.     eletriptan  (RELPAX ) 40 MG tablet 1 tablet Orally Once a day     escitalopram  (LEXAPRO ) 10 MG tablet 1 tablet Orally Once a day     famotidine (PEPCID) 20 MG tablet Take 20 mg by mouth 2 (two) times daily.     Fremanezumab -vfrm (AJOVY ) 225 MG/1.5ML SOAJ Inject 225 mg into the skin every 30 (thirty) days. 4.5 mL 4   lactulose  (CHRONULAC ) 10 GM/15ML solution Take 45 mLs (30 g total) by mouth 2 (two) times daily. 1800 mL 5   Lactulose  20 GM/30ML SOLN Take 30 mLs (20 g total) by mouth 3 (three) times daily as needed. 900 mL 5   latanoprost  (XALATAN ) 0.005 % ophthalmic solution Place 1 drop into both eyes at bedtime. 2.5 mL 12   linaclotide  (LINZESS ) 290 MCG CAPS capsule Take 1 capsule (290 mcg total) by mouth  daily before breakfast. 30 capsule 5   meloxicam  (MOBIC ) 15 MG tablet 1 tablet Orally Once a day     methylPREDNISolone  (MEDROL ) 4 MG tablet 1 tablet with food or milk Orally every 12 hrs     Misc Natural Products (JOINT HEALTH PO) Take by mouth.     montelukast  (SINGULAIR ) 10 MG tablet 1 tablet Orally Once a day     Multiple Vitamins-Minerals (WOMENS MULTIVITAMIN PO) Take by mouth.     pantoprazole  (PROTONIX ) 40 MG tablet Take 1 tablet (40 mg total) by mouth daily. 30 tablet 5   Probiotic Product (PROBIOTIC PO) Take by mouth.     QUEtiapine  (SEROQUEL  XR) 400 MG 24 hr tablet Take 1 tablet (400 mg total) by mouth at bedtime. (Patient taking differently: Take 800 mg by mouth at bedtime.) 30 tablet 1   Rimegepant Sulfate  (NURTEC) 75 MG TBDP Take 1 tablet (75 mg total) by mouth as needed (for breakthrough migraine). 8 tablet 8   rizatriptan  (MAXALT -MLT) 10 MG disintegrating tablet Take 1 tablet (10 mg total) by mouth as needed for migraine (May repeat in 2 hours if needed). 10 tablet 12   Sennosides (SENOKOT PO) Take 20 mg by mouth in the morning and at  bedtime.     temazepam  (RESTORIL ) 15 MG capsule Take 1 capsule (15 mg total) by mouth at bedtime. 30 capsule 0   triamterene -hydrochlorothiazide  (MAXZIDE -25) 37.5-25 MG tablet TAKE 1 TABLET BY MOUTH EVERY DAY 90 tablet 1   Turmeric (QC TUMERIC COMPLEX) 500 MG CAPS Take by mouth.     UNABLE TO FIND Take 2 tablets by mouth at bedtime. Med Name: magnesium  L theronate  650 mg at HS     zinc gluconate 50 MG tablet Take 50 mg by mouth daily.     No current facility-administered medications for this visit.    Allergies as of 10/05/2023   (No Known Allergies)    Past Medical History:  Diagnosis Date   Allergy    Anxiety    Asthma    Depression    Hyperlipidemia    Hypertension    Migraines    Osteopenia after menopause    Persistent headaches    Pneumonia 2020   Postherpetic neuralgia    Renal disorder    Shingles outbreak 02/2014   Skin  cancer    moles on right groin and right buttock.   Slurred speech 09/09/2022    Past Surgical History:  Procedure Laterality Date   CARDIOVASCULAR STRESS TEST  02/2009   treadmill stress test: Low risk   CESAREAN SECTION  1987, 1989   COLONOSCOPY     2014/2015 [facility], normal   COLONOSCOPY WITH PROPOFOL  N/A 10/20/2021   Procedure: COLONOSCOPY WITH PROPOFOL ;  Surgeon: Therisa Bi, MD;  Location: [facility] ENDOSCOPY;  Service: Gastroenterology;  Laterality: N/A;   COLONOSCOPY WITH PROPOFOL  N/A 09/02/2022   Procedure: COLONOSCOPY WITH PROPOFOL ;  Surgeon: Unk Corinn Skiff, MD;  Location: [facility] ENDOSCOPY;  Service: Gastroenterology;  Laterality: N/A;  Requests about 11:15 arrival   DIAGNOSTIC LAPAROSCOPY     ESOPHAGOGASTRODUODENOSCOPY (EGD) WITH PROPOFOL  N/A 09/02/2022   Procedure: ESOPHAGOGASTRODUODENOSCOPY (EGD) WITH PROPOFOL ;  Surgeon: Unk Corinn Skiff, MD;  Location: [facility] ENDOSCOPY;  Service: Gastroenterology;  Laterality: N/A;   EXPLORATORY LAPAROTOMY     laproscopy for infertility   EYE SURGERY Bilateral    laser-correct opening b/tn cornea and iris   MOLE REMOVAL  2017   x 2 moles    Review of Systems:    All systems reviewed and negative except where noted in HPI.    Physical Exam:  There were no vitals taken for this visit. No LMP recorded. Patient is postmenopausal.  General: Well-nourished, well-developed in no acute distress.  Lungs: Clear to auscultation bilaterally. Non-labored. Heart: Regular rate and rhythm, no murmurs rubs or gallops.  Abdomen: Bowel sounds are normal; Abdomen is Soft; No hepatosplenomegaly, masses or hernias;  No Abdominal Tenderness; No guarding or rebound tenderness. Neuro: Alert and oriented x 3.  Grossly intact.  Psych: Alert and cooperative, normal mood and affect.   Imaging Studies: No results found.  Labs: CBC    Component Value Date/Time   WBC 6.1 08/31/2023 1551   RBC 4.27 08/31/2023 1551   HGB 12.7 08/31/2023 1551   HGB 12.9  05/30/2023 1346   HCT 38.3 08/31/2023 1551   HCT 38.0 05/30/2023 1346   PLT 266.0 08/31/2023 1551   PLT 273 05/30/2023 1346   MCV 89.7 08/31/2023 1551   MCV 89 05/30/2023 1346   MCH 30.1 05/30/2023 1346   MCH 30.8 09/09/2022 1300   MCHC 33.2 08/31/2023 1551   RDW 12.7 08/31/2023 1551   RDW 11.6 (L) 05/30/2023 1346   LYMPHSABS 1.5 08/31/2023 1551   LYMPHSABS 1.1  05/30/2023 1346   MONOABS 0.5 08/31/2023 1551   EOSABS 0.0 08/31/2023 1551   EOSABS 0.0 05/30/2023 1346   BASOSABS 0.0 08/31/2023 1551   BASOSABS 0.0 05/30/2023 1346    CMP     Component Value Date/Time   NA 133 (L) 08/31/2023 1551   NA 140 12/10/2020 0954   K 4.2 08/31/2023 1551   CL 98 08/31/2023 1551   CO2 30 08/31/2023 1551   GLUCOSE 95 08/31/2023 1551   BUN 19 08/31/2023 1551   BUN 18 12/10/2020 0954   CREATININE 0.66 08/31/2023 1551   CREATININE 0.90 06/08/2017 1534   CALCIUM  9.0 08/31/2023 1551   PROT 7.3 08/31/2023 1551   PROT 6.8 12/10/2020 0954   ALBUMIN 4.4 08/31/2023 1551   ALBUMIN 4.6 12/10/2020 0954   AST 23 08/31/2023 1551   ALT 20 08/31/2023 1551   ALKPHOS 73 08/31/2023 1551   BILITOT 0.3 08/31/2023 1551   BILITOT <0.2 12/10/2020 0954   GFRNONAA >60 09/09/2022 1300   GFRAA >60 02/11/2019 0702       Assessment and Plan:   Theresa Martin is a 68 y.o. y/o female ***    Ellouise Console, PA-C  Follow up ***

## 2023-10-04 ENCOUNTER — Telehealth: Payer: Self-pay | Admitting: Family Medicine

## 2023-10-04 ENCOUNTER — Telehealth: Payer: Self-pay

## 2023-10-04 DIAGNOSIS — M8580 Other specified disorders of bone density and structure, unspecified site: Secondary | ICD-10-CM

## 2023-10-04 DIAGNOSIS — M9905 Segmental and somatic dysfunction of pelvic region: Secondary | ICD-10-CM | POA: Diagnosis not present

## 2023-10-04 DIAGNOSIS — M9902 Segmental and somatic dysfunction of thoracic region: Secondary | ICD-10-CM | POA: Diagnosis not present

## 2023-10-04 DIAGNOSIS — K5900 Constipation, unspecified: Secondary | ICD-10-CM | POA: Diagnosis not present

## 2023-10-04 DIAGNOSIS — M545 Low back pain, unspecified: Secondary | ICD-10-CM | POA: Diagnosis not present

## 2023-10-04 DIAGNOSIS — M9904 Segmental and somatic dysfunction of sacral region: Secondary | ICD-10-CM | POA: Diagnosis not present

## 2023-10-04 DIAGNOSIS — M9908 Segmental and somatic dysfunction of rib cage: Secondary | ICD-10-CM | POA: Diagnosis not present

## 2023-10-04 DIAGNOSIS — M9906 Segmental and somatic dysfunction of lower extremity: Secondary | ICD-10-CM | POA: Diagnosis not present

## 2023-10-04 DIAGNOSIS — Z1231 Encounter for screening mammogram for malignant neoplasm of breast: Secondary | ICD-10-CM

## 2023-10-04 DIAGNOSIS — R1013 Epigastric pain: Secondary | ICD-10-CM | POA: Diagnosis not present

## 2023-10-04 DIAGNOSIS — M25551 Pain in right hip: Secondary | ICD-10-CM | POA: Diagnosis not present

## 2023-10-04 DIAGNOSIS — M9903 Segmental and somatic dysfunction of lumbar region: Secondary | ICD-10-CM | POA: Diagnosis not present

## 2023-10-04 DIAGNOSIS — Z780 Asymptomatic menopausal state: Secondary | ICD-10-CM

## 2023-10-04 NOTE — Telephone Encounter (Signed)
 Copied from CRM [id number]. Topic: Referral - Question >> Oct 04, 2023  3:43 PM Suzen RAMAN wrote:   Reason for CRM: patient would like to know if she needs another order placed for mammogram and bone density. CB#[phone]

## 2023-10-04 NOTE — Telephone Encounter (Signed)
 Copied from CRM [id number]. Topic: Clinical - Medical Advice >> Oct 04, 2023  3:42 PM Suzen RAMAN wrote: Reason for CRM: Patient would like to know if or when she needs to get another shingles vaccine also Hep B CB# [phone]

## 2023-10-05 ENCOUNTER — Ambulatory Visit: Admitting: Physician Assistant

## 2023-10-05 NOTE — Addendum Note (Signed)
 Addended by: Graclynn Vanantwerp E on: 10/05/2023 11:54 AM   Modules accepted: Orders

## 2023-10-05 NOTE — Telephone Encounter (Signed)
 Pt has been informed.

## 2023-10-05 NOTE — Telephone Encounter (Signed)
**Note De-identified  [place] Obfuscation** Please advise 

## 2023-10-05 NOTE — Telephone Encounter (Signed)
 Orders for both mammo and DEXA entered

## 2023-10-05 NOTE — Telephone Encounter (Signed)
 In Health Maintenance it shows Aged out of Hep B Vaccine and Shingles vaccine series looks complete wanted to confirm with PCP

## 2023-10-05 NOTE — Telephone Encounter (Signed)
 Shingles vaccine series is complete.  No need for Hep B at this time unless she will be doing work or traveling to an area that puts her at high risk of exposure to body fluids

## 2023-10-10 DIAGNOSIS — F411 Generalized anxiety disorder: Secondary | ICD-10-CM | POA: Diagnosis not present

## 2023-10-10 DIAGNOSIS — F3132 Bipolar disorder, current episode depressed, moderate: Secondary | ICD-10-CM | POA: Diagnosis not present

## 2023-10-24 ENCOUNTER — Ambulatory Visit: Admitting: Physician Assistant

## 2023-10-25 DIAGNOSIS — F319 Bipolar disorder, unspecified: Secondary | ICD-10-CM | POA: Diagnosis not present

## 2023-10-25 DIAGNOSIS — F5104 Psychophysiologic insomnia: Secondary | ICD-10-CM | POA: Diagnosis not present

## 2023-10-25 DIAGNOSIS — F411 Generalized anxiety disorder: Secondary | ICD-10-CM | POA: Diagnosis not present

## 2023-10-26 ENCOUNTER — Encounter: Payer: Self-pay | Admitting: Family Medicine

## 2023-10-27 DIAGNOSIS — F3132 Bipolar disorder, current episode depressed, moderate: Secondary | ICD-10-CM | POA: Diagnosis not present

## 2023-10-27 DIAGNOSIS — F411 Generalized anxiety disorder: Secondary | ICD-10-CM | POA: Diagnosis not present

## 2023-10-27 NOTE — Telephone Encounter (Signed)
 Please advise, thank you.

## 2023-10-28 DIAGNOSIS — M25451 Effusion, right hip: Secondary | ICD-10-CM | POA: Diagnosis not present

## 2023-10-28 DIAGNOSIS — B029 Zoster without complications: Secondary | ICD-10-CM | POA: Diagnosis not present

## 2023-10-28 DIAGNOSIS — M25551 Pain in right hip: Secondary | ICD-10-CM | POA: Diagnosis not present

## 2023-10-28 DIAGNOSIS — M160 Bilateral primary osteoarthritis of hip: Secondary | ICD-10-CM | POA: Diagnosis not present

## 2023-11-09 ENCOUNTER — Ambulatory Visit
Admission: RE | Admit: 2023-11-09 | Discharge: 2023-11-09 | Disposition: A | Discharge status: Home / Self Care | Source: Ambulatory Visit | Attending: Family Medicine | Admitting: Family Medicine

## 2023-11-09 DIAGNOSIS — Z780 Asymptomatic menopausal state: Secondary | ICD-10-CM | POA: Diagnosis not present

## 2023-11-09 DIAGNOSIS — M8580 Other specified disorders of bone density and structure, unspecified site: Secondary | ICD-10-CM | POA: Diagnosis not present

## 2023-11-09 DIAGNOSIS — Z1231 Encounter for screening mammogram for malignant neoplasm of breast: Secondary | ICD-10-CM | POA: Insufficient documentation

## 2023-11-09 DIAGNOSIS — M8589 Other specified disorders of bone density and structure, multiple sites: Secondary | ICD-10-CM | POA: Diagnosis not present

## 2023-11-18 ENCOUNTER — Ambulatory Visit: Payer: Self-pay | Admitting: Family Medicine

## 2023-11-18 ENCOUNTER — Telehealth: Payer: Self-pay

## 2023-11-18 NOTE — Progress Notes (Signed)
 Called patient and advised her to take vitamin D  5000 units daily and 1200mg  of calcium  daily. She verbalized understanding of where osteopenia is located.

## 2023-11-18 NOTE — Telephone Encounter (Signed)
-----   Message from Theresa Martin sent at 11/18/2023  3:20 PM EDT ----- The bone density tests indicates you have osteopenia- thinning bone but not as severe as osteoporosis.  Please make sure you are taking daily calcium  and Vit D supplements ----- Message ----- From: Interface, Rad Results In Sent: 11/11/2023   4:47 AM EDT To: Theresa FORBES Greet, MD

## 2023-11-18 NOTE — Telephone Encounter (Signed)
 Copied from CRM #[id number]. Topic: Clinical - Lab/Test Results >> Nov 18, 2023  1:15 PM [place] wrote: Reason for CRM: patient calling to get an update on bone density test

## 2023-11-18 NOTE — Telephone Encounter (Signed)
 Patient verbalized understanding of results.   She wants to know how much calcium  she should take? S  he is currently taking vitamin d3 5,000 unites daily.   She also wants to know where is the osteopenia at (all over or just in one area)?

## 2023-11-18 NOTE — Telephone Encounter (Signed)
 Patient inquiring about bone density results form 11/09/23. Please advise, thank you

## 2023-11-21 DIAGNOSIS — F3132 Bipolar disorder, current episode depressed, moderate: Secondary | ICD-10-CM | POA: Diagnosis not present

## 2023-11-21 DIAGNOSIS — F411 Generalized anxiety disorder: Secondary | ICD-10-CM | POA: Diagnosis not present

## 2023-11-21 NOTE — Telephone Encounter (Unsigned)
 Copied from CRM #[id number]. Topic: Clinical - Medical Advice >> Nov 21, 2023  9:54 AM Lavanda D wrote: Reason for CRM: Patient is calling because Dr. Mahlon wants her to start taking Calcium . She found one at the store that has calcium  + zinc and magnesium  but since she already takes a zinc and magnesium  supplement on their own she is wondering if she should switch to the combined supplement of calcium  + zinc/how much. She said she currently takes 50mg  and she said she googled it and was told the 50mg  dose is too high. She takes magnesium  1500mg  a day.

## 2023-11-21 NOTE — Progress Notes (Signed)
 LVM to call office.

## 2023-11-21 NOTE — Telephone Encounter (Unsigned)
 Copied from CRM #[id number]. Topic: Clinical - Medical Advice >> Nov 21, 2023  3:05 PM Viola F wrote: Reason for CRM: Patient returned Tonya's call, RELAYED message to patient and she had further questions, transferred to clinic access line

## 2023-11-21 NOTE — Telephone Encounter (Signed)
-----   Message from Alfredo DELENA Shope, CMA sent at 11/21/2023 10:18 AM EDT -----

## 2023-11-24 NOTE — Telephone Encounter (Signed)
 Responded to via Result Note

## 2023-11-25 DIAGNOSIS — M9905 Segmental and somatic dysfunction of pelvic region: Secondary | ICD-10-CM | POA: Diagnosis not present

## 2023-11-25 DIAGNOSIS — M9903 Segmental and somatic dysfunction of lumbar region: Secondary | ICD-10-CM | POA: Diagnosis not present

## 2023-11-25 DIAGNOSIS — M5441 Lumbago with sciatica, right side: Secondary | ICD-10-CM | POA: Diagnosis not present

## 2023-11-25 DIAGNOSIS — M25551 Pain in right hip: Secondary | ICD-10-CM | POA: Diagnosis not present

## 2023-11-25 DIAGNOSIS — M9908 Segmental and somatic dysfunction of rib cage: Secondary | ICD-10-CM | POA: Diagnosis not present

## 2023-11-25 DIAGNOSIS — M8589 Other specified disorders of bone density and structure, multiple sites: Secondary | ICD-10-CM | POA: Diagnosis not present

## 2023-11-25 DIAGNOSIS — M9901 Segmental and somatic dysfunction of cervical region: Secondary | ICD-10-CM | POA: Diagnosis not present

## 2023-11-25 DIAGNOSIS — M9904 Segmental and somatic dysfunction of sacral region: Secondary | ICD-10-CM | POA: Diagnosis not present

## 2023-11-25 DIAGNOSIS — M9906 Segmental and somatic dysfunction of lower extremity: Secondary | ICD-10-CM | POA: Diagnosis not present

## 2023-11-25 DIAGNOSIS — M9902 Segmental and somatic dysfunction of thoracic region: Secondary | ICD-10-CM | POA: Diagnosis not present

## 2023-11-29 ENCOUNTER — Encounter: Payer: Self-pay | Admitting: Family Medicine

## 2023-11-29 DIAGNOSIS — F411 Generalized anxiety disorder: Secondary | ICD-10-CM | POA: Diagnosis not present

## 2023-11-29 DIAGNOSIS — F5104 Psychophysiologic insomnia: Secondary | ICD-10-CM | POA: Diagnosis not present

## 2023-11-29 DIAGNOSIS — F319 Bipolar disorder, unspecified: Secondary | ICD-10-CM | POA: Diagnosis not present

## 2023-11-29 NOTE — Telephone Encounter (Signed)
 Patient is questioning if she should be taking Vitamin B complex and fish oil?

## 2023-11-29 NOTE — Telephone Encounter (Signed)
 Patient is questioning mg for over the counter fish oil.

## 2023-12-01 ENCOUNTER — Telehealth: Payer: Self-pay | Admitting: Physician Assistant

## 2023-12-01 NOTE — Telephone Encounter (Signed)
 Patient is curious if 1200mg  of fish oil is too much

## 2023-12-01 NOTE — Telephone Encounter (Signed)
 Good morning Theresa Martin,   Patient called stating that she needed to reschedule her appointment on 9/12 at 10:40.   She states she called yesterday and someone was supposed to send a message to ask if she could do a virtual appointment instead. I do not see message for patient.  Will you please review and advise if patient can proceed with virtual appointment?

## 2023-12-02 NOTE — Telephone Encounter (Signed)
 Patient stated will is going to look at her calendar and give a call back to schedule.

## 2023-12-02 NOTE — Telephone Encounter (Signed)
 Called patient to discuss rescheduling. States she will call back to reschedule virtual visit with Ellouise

## 2023-12-05 ENCOUNTER — Ambulatory Visit: Admitting: Physician Assistant

## 2023-12-05 DIAGNOSIS — F411 Generalized anxiety disorder: Secondary | ICD-10-CM | POA: Diagnosis not present

## 2023-12-05 DIAGNOSIS — F3132 Bipolar disorder, current episode depressed, moderate: Secondary | ICD-10-CM | POA: Diagnosis not present

## 2023-12-09 ENCOUNTER — Ambulatory Visit: Admitting: Physician Assistant

## 2023-12-19 DIAGNOSIS — F411 Generalized anxiety disorder: Secondary | ICD-10-CM | POA: Diagnosis not present

## 2023-12-19 DIAGNOSIS — F3132 Bipolar disorder, current episode depressed, moderate: Secondary | ICD-10-CM | POA: Diagnosis not present

## 2024-01-02 DIAGNOSIS — F411 Generalized anxiety disorder: Secondary | ICD-10-CM | POA: Diagnosis not present

## 2024-01-02 DIAGNOSIS — F3132 Bipolar disorder, current episode depressed, moderate: Secondary | ICD-10-CM | POA: Diagnosis not present

## 2024-01-17 DIAGNOSIS — F411 Generalized anxiety disorder: Secondary | ICD-10-CM | POA: Diagnosis not present

## 2024-01-17 DIAGNOSIS — F5104 Psychophysiologic insomnia: Secondary | ICD-10-CM | POA: Diagnosis not present

## 2024-01-17 DIAGNOSIS — F3132 Bipolar disorder, current episode depressed, moderate: Secondary | ICD-10-CM | POA: Diagnosis not present

## 2024-01-18 DIAGNOSIS — F411 Generalized anxiety disorder: Secondary | ICD-10-CM | POA: Diagnosis not present

## 2024-01-18 DIAGNOSIS — F3132 Bipolar disorder, current episode depressed, moderate: Secondary | ICD-10-CM | POA: Diagnosis not present

## 2024-01-30 DIAGNOSIS — F411 Generalized anxiety disorder: Secondary | ICD-10-CM | POA: Diagnosis not present

## 2024-01-30 DIAGNOSIS — F3132 Bipolar disorder, current episode depressed, moderate: Secondary | ICD-10-CM | POA: Diagnosis not present

## 2024-02-07 ENCOUNTER — Encounter: Payer: Self-pay | Admitting: Internal Medicine

## 2024-02-07 ENCOUNTER — Ambulatory Visit: Admitting: Internal Medicine

## 2024-02-07 VITALS — BP 120/60 | HR 76 | Temp 98.7°F | Ht 61.0 in | Wt 120.6 lb

## 2024-02-07 DIAGNOSIS — R918 Other nonspecific abnormal finding of lung field: Secondary | ICD-10-CM

## 2024-02-07 DIAGNOSIS — J45909 Unspecified asthma, uncomplicated: Secondary | ICD-10-CM | POA: Diagnosis not present

## 2024-02-07 DIAGNOSIS — J4520 Mild intermittent asthma, uncomplicated: Secondary | ICD-10-CM

## 2024-02-07 NOTE — Progress Notes (Unsigned)
 Name: Theresa Martin MRN: [id number] DOB: 1955-06-29       TEST/EVENTS :  CT chest 10/2018 showed chronic changes in RUL felt from previous PNA  And multiple 3 mm nodules.   CT chest November 2021 negative for PE, stable chronic scarring in the lung apices, chronic airspace opacity in the right upper lobe, opacity in the left lower lobe, stable to millimeter lung nodules, no adenopathy, small hiatal hernia  CBC - Eosinophils 11/2020 -ab eos (100)  Triggers include smoke perfumes colognes  CT of the chest reviewed 2021 RML opacity, small lung nodules  CT chest 10/2022 No significant nodule seen compared to 2021  HST 10/2022 No evidence of sleep apnea ONO no evidence of hypoxia  CHIEF COMPLAINT Follow-up assessment for asthma   HISTORY OF PRESENT ILLNESS: Assessment of asthma Well-controlled No exacerbation Previous plan was to wean off Symbicort  Patient doing well at this time with Symbicort  as needed Use albuterol  as needed  No exacerbation at this time No evidence of heart failure at this time No evidence or signs of infection at this time No respiratory distress No fevers, chills, nausea, vomiting, diarrhea No evidence of lower extremity edema No evidence hemoptysis   Most recent HST August 2024 reviewed in detail with patient AHI 1.9   PAST MEDICAL HISTORY :   has a past medical history of Allergy, Anxiety, Asthma, Depression, Hyperlipidemia, Hypertension, Migraines, Osteopenia after menopause, Persistent headaches, Pneumonia (2020), Postherpetic neuralgia, Renal disorder, Shingles outbreak (02/2014), Skin cancer, and Slurred speech (09/09/2022).  has a past surgical history that includes Cesarean section (1987, 1989); Exploratory laparotomy; Cardiovascular stress test (02/2009); Mole removal (2017); Eye surgery (Bilateral); Colonoscopy; Diagnostic laparoscopy; Colonoscopy with propofol  (N/A, 10/20/2021); Colonoscopy with propofol  (N/A, 09/02/2022); and  Esophagogastroduodenoscopy (egd) with propofol  (N/A, 09/02/2022). Prior to Admission medications   Medication Sig Start Date End Date Taking? Authorizing Provider  albuterol  (VENTOLIN  HFA) 108 (90 Base) MCG/ACT inhaler Inhale 2 puffs into the lungs every 6 (six) hours as needed for wheezing or shortness of breath. 08/10/18   Tabori, Katherine E, MD  atorvastatin  (LIPITOR) 20 MG tablet TAKE 1 TABLET BY MOUTH EVERY DAY 04/10/18   Tabori, Katherine E, MD  beclomethasone (QVAR ) 80 MCG/ACT inhaler Inhale 1-2 puffs into the lungs 2 (two) times daily. 09/27/18   Gladis Elsie BROCKS, PA-C  Calcium -Vitamin D -Vitamin K  (CALCIUM  + D + K PO) Take 3 each by mouth daily. Calcium  500mg - Vitamin D  1000iu- K 40mg      [provider]  clonazePAM  (KLONOPIN ) 0.5 MG tablet Take 0.25 mg by mouth at bedtime as needed.  01/12/14   [provider]  ELDERBERRY PO Take by mouth.    [provider]  escitalopram  (LEXAPRO ) 10 MG tablet Take 10 mg by mouth daily.     [provider]  gabapentin  (NEURONTIN ) 100 MG capsule Take 1-3 capsules (100-300 mg total) by mouth 2 (two) times daily. Patient taking differently: Take 100-300 mg by mouth 2 (two) times daily. 100 in the morning and 200 at night. 01/30/18   Penumalli, Vikram R, MD  Glucosamine-Chondroitin (MOVE FREE PO) Take 1 tablet by mouth daily.    [provider]  Ketoconazole-Hydrocortisone 2 & 1 % KIT Apply topically as needed. Cracks on mouth    [provider]  lamoTRIgine  (LAMICTAL ) 100 MG tablet TAKE 100mg  in the morning and 100 mg by mouth before bed.    [provider]  Melatonin 3 MG CAPS Take 1 capsule by mouth at bedtime.  [provider]  montelukast  (SINGULAIR ) 10 MG tablet Take 1 tablet (10 mg total) by mouth at bedtime. 09/27/18   Gladis Elsie BROCKS, PA-C  Naproxen  Sodium (ALEVE ) 220 MG CAPS As needed for headache 04/15/15   Penumalli, Vikram R, MD  NEOMYCIN -POLYMYXIN-HYDROCORTISONE (CORTISPORIN) 1  % SOLN OTIC solution Apply 1-2 drops to toe bid after soaking 04/03/18   Hyatt, Max T, DPM  traZODone  (DESYREL ) 50 MG tablet Take 50 mg by mouth at bedtime as needed for sleep.  05/29/15   [provider]  triamterene -hydrochlorothiazide  (MAXZIDE -25) 37.5-25 MG tablet TAKE 1 TABLET BY MOUTH EVERY DAY 10/04/18   Mahlon Comer BRAVO, MD   No Known Allergies  FAMILY HISTORY:  family history includes Arrhythmia in her brother; Coronary artery disease in her mother; Heart failure in her mother; Hypertension in her brother, father, and mother; Liver disease in her sister; Migraines in her sister; Transient ischemic attack in her mother. SOCIAL HISTORY:  reports that she has never smoked. She has never used smokeless tobacco. She reports that she does not drink alcohol  and does not use drugs.     BP 120/60   Pulse 76   Temp 98.7 F (37.1 C)   Ht 5' 1 (1.549 m)   Wt 120 lb 9.6 oz (54.7 kg)   SpO2 97%   BMI 22.79 kg/m     Physical Examination:  General Appearance: No distress  EYES EOM intact.   NECK Supple, No JVD Pulmonary: normal breath sounds, No wheezing.  CardiovascularNormal S1,S2.  No m/r/g.   Ext pulses intact, cap refill intact  ALL OTHER ROS ARE NEGATIVE       IMAGING   CT chest 10/2018  No acute process, 3 mm nodules No further CT scans needed  CT chest August 2024 CT  was Independently Reviewed By Me Today       ASSESSMENT AND PLAN SYNOPSIS  68 year old pleasant white female seen today for follow-up assessment for asthma and abnormal CT chest with right middle lobe opacity and multiple subcentimeter lung nodules   CT CHEST 06/2022 REPORTS 1. No significant change in a small number of small bilateral lung nodules, the largest measuring 3 mm in maximum diameter. These do not need imaging follow-up unless otherwise clinically indicated.    Asthma well-controlled No indication for antibiotics or prednisone  at this time Using Symbicort  as  needed Avoid Allergens and Irritants Avoid secondhand smoke Avoid SICK contacts Recommend  Masking  when appropriate Recommend Keep up-to-date with vaccinations No exacerbation at this time No evidence of heart failure at this time No evidence or signs of infection at this time No respiratory distress No fevers, chills, nausea, vomiting, diarrhea No evidence of lower extremity edema No evidence hemoptysis   Previous diagnosis of sleep apnea HST did not show any evidence of sleep apnea ONO did not reveal nocturnal hypoxia    CURRENT MEDICATIONS REVIEWED AT LENGTH WITH PATIENT TODAY   Patient  satisfied with Plan of action and management. All questions answered   Follow up as needed   I spent a total of 41 minutes dedicated to the care of this patient on the date of this encounter to include pre-visit review of records, face-to-face time with the patient discussing conditions above, post visit ordering of testing, clinical documentation with the electronic health record, making appropriate referrals as documented, and communicating necessary information to the patient's healthcare team.    The Patient requires high complexity decision making for assessment and support, frequent evaluation and  titration of therapies, application of advanced monitoring technologies and extensive interpretation of multiple databases.  Patient satisfied with Plan of action and management. All questions answered    Nickolas Alm Cellar, M.D.  [facility]  Medical Director [facility]

## 2024-02-07 NOTE — Patient Instructions (Signed)
 Asthma well-controlled with avoidance of triggers Follow-up as needed

## 2024-02-27 ENCOUNTER — Other Ambulatory Visit: Payer: Self-pay | Admitting: Family Medicine

## 2024-02-27 DIAGNOSIS — E785 Hyperlipidemia, unspecified: Secondary | ICD-10-CM

## 2024-02-29 DIAGNOSIS — F319 Bipolar disorder, unspecified: Secondary | ICD-10-CM | POA: Diagnosis not present

## 2024-02-29 DIAGNOSIS — F411 Generalized anxiety disorder: Secondary | ICD-10-CM | POA: Diagnosis not present

## 2024-03-05 DIAGNOSIS — F411 Generalized anxiety disorder: Secondary | ICD-10-CM | POA: Diagnosis not present

## 2024-03-05 DIAGNOSIS — F3132 Bipolar disorder, current episode depressed, moderate: Secondary | ICD-10-CM | POA: Diagnosis not present

## 2024-03-08 ENCOUNTER — Ambulatory Visit: Admitting: Internal Medicine

## 2024-04-04 ENCOUNTER — Other Ambulatory Visit: Payer: Self-pay | Admitting: Diagnostic Neuroimaging

## 2024-04-05 ENCOUNTER — Telehealth: Payer: Self-pay

## 2024-04-05 NOTE — Telephone Encounter (Signed)
 Patient has questions regarding over the counter vitamins of tumeric and fish oil

## 2024-04-05 NOTE — Telephone Encounter (Signed)
 Copied from CRM #[id number]. Topic: Clinical - Medical Advice >> Apr 05, 2024 12:31 PM Alfonso ORN wrote: Reason for CRM: pt called to send message to provider. pt wants to know if 2250mg  of turmeric once a day is ok ? Also pt will be taking  fish oil is 1200mg  each twice daily instead 1000mg  is this ok? Please advise

## 2024-04-05 NOTE — Telephone Encounter (Signed)
 Patient has an appointment on 04/09/24. Medication will be refilled at the time of their appointment

## 2024-04-06 NOTE — Telephone Encounter (Signed)
 Patient needs to schedule a follow up appointment with Dr. Mahlon. Patient is supposed to call me back and let me know when she will be able to come in. If patient calls back, please schedule appt

## 2024-04-06 NOTE — Telephone Encounter (Signed)
Called patient and verbalized understanding  

## 2024-04-06 NOTE — Telephone Encounter (Signed)
 2250mg  of Turmeric once daily is appropriate  1000-1500mg  of fish oil daily is recommended.  Not twice daily

## 2024-04-09 ENCOUNTER — Encounter: Payer: Self-pay | Admitting: Diagnostic Neuroimaging

## 2024-04-09 ENCOUNTER — Telehealth: Payer: Medicare Other | Admitting: Diagnostic Neuroimaging

## 2024-04-09 DIAGNOSIS — G43109 Migraine with aura, not intractable, without status migrainosus: Secondary | ICD-10-CM | POA: Diagnosis not present

## 2024-04-09 DIAGNOSIS — R200 Anesthesia of skin: Secondary | ICD-10-CM | POA: Diagnosis not present

## 2024-04-09 MED ORDER — NURTEC 75 MG PO TBDP: 75.0000 mg | ORAL_TABLET | ORAL | 8 refills | Status: DC | PRN

## 2024-04-09 MED ORDER — AJOVY 225 MG/1.5ML ~~LOC~~ SOAJ: 225.0000 mg | SUBCUTANEOUS | 4 refills | Status: AC

## 2024-04-09 NOTE — Telephone Encounter (Signed)
 Called patient back and advised that she take what the bottle has directed. She said she got a 1,000mg  bottle and it says to take one daily. I told her that would be correct. She said she was taking two of the 1,200mg  of fish oil. I advised that it was too much and she should only take 1 a day. She verbalized understanding.

## 2024-04-09 NOTE — Progress Notes (Signed)
 "  [facility]  PATIENT: Theresa Martin DOB: January 24, 1956  REFERRING CLINICIAN: Mahlon Comer BRAVO, MD  HISTORY FROM: patient REASON FOR VISIT: follow up   HISTORICAL  CHIEF COMPLAINT:  Chief Complaint  Patient presents with   Migraine    HISTORY OF PRESENT ILLNESS:   UPDATE (04/09/24, VRP): Since last visit, doing well. Symptoms are stable. Avg ~1-2 HA per month, except during stress, such as in Dec 2025 (~10 HA).  Tolerating meds. Ajovy  working well! No HA between Aug and Dec 2025.  UPDATE (04/04/23, VRP): Since last visit, continues with headaches (15 major headaches per month on avg; 24 in last month). Tolerating meds. Stress levels high over the holidays, but now slightly better.   UPDATE (09/28/22, VRP): Since last visit, still with numbness on head, tongue, toes. No fluctuation. No weakness. Still with some stress issues (car issue, glasses). Still with headache issues.  UPDATE (09/14/22, VRP): Since last visit, doing well until 09/10/22 had onset of slurred speech, tongue numbness at pulmonary appt, then went to ER for eval. MRI, CTA negative, except for some fibromuscular dysplasia findings incidentally noted. Could have been complicated migraine.    UPDATE (05/26/22, VRP): Since last visit, doing well. Symptoms are improved. Now doing well with ajovy  injections. 1-2 HA per month. Mainly stress associated. Nurtec and rizatriptan  working well. Depression still an issue.   UPDATE (05/25/21, VRP): Since last visit, doing well, except recent injection site reaction; used same ajovy  needle 3 times (05/20/21), b/c of retained medication in needle. Then had red and swelling around site. Now on steroid and abx, and sxs getting better. Never had this issue before.  UPDATE (11/17/20, VRP): Since last visit, doing well until 3 weeks ago; returned from vacation with family ([place]; fun but stressful). Then almost daily left sided throbbing HA, left side numbness. Tried  gabapentin  increase, prednisone , indomethacin ; no relief. Phonophobia. No nausea.   UPDATE (11/06/20, MM): 69 year old female with a history of migraine headaches.  She returns today for follow-up.  She reports that she has had an ongoing headache for 2 weeks.  Reports that it starts on the left side behind the eye.  She reports that she has a tingling sensation that radiates down the face and down the arm into the leg and toes.  She has been taking an over-the-counter medication Tylenol  and Advil  on a daily basis.  Her sports medicine doctor saw her in the last 2 weeks and did an injection.  She is not sure what the injection was.  His notes is not in epic.  She also reports that she was given a prescription for tramadol.  She states that several years ago she had a severe headache with tingling sensations down the left side of the body and went to the emergency room.  Reports that she had a complete stroke work-up that was negative.  Patient had MRI of the brain with and without contrast in June 2021 that was unremarkable.  She remains on gabapentin  400 mg twice a day.  Reports that this has been controlling her headaches up until the last 2 weeks.  UPDATE (08/08/19, VRP): Since last visit, doing well, except past 3 months, has left eye pulling sensation, left eye pain, then left head pain. Some sensations in right eye. Also with mild glaucoma.   UPDATE (03/21/19, VRP): Since last visit, doing well until COVID pandemic. She weaned herself off her psych meds, then mania, psychosis . Then headaches worsened. Admitted to behavioral  health. Now psych issues are better, and headache resolved since last month.   UPDATE (01/30/18, VRP): Since last visit, doing worse with HA (14-15 per month). More stress levels than last visit. Burning sensation in scalp continues.  UPDATE (08/24/17, VRP): Since last visit, doing well. Tolerating propranolol . Avg 4-13 migraine per month. No alleviating or aggravating factors.    Separately, had right thoracic burning pain (no rash) in fall 2018, and was tx'd empirically with acyclovir . Then with right scalp burning pain last week; also with chronic left scalp sensitivity with migraines.  UPDATE 08/24/16: Since last visit, avg 2-3 HA per month. Tolerating meds. No new issues. Mood stable. Weight improved.   UPDATE 02/23/16: Since last visit has ~ 1-5 HA per month. Tolerating propranolol  + OTC tylenol /aleve  for HA mgmt.   UPDATE 08/19/15: Since last visit, avg 1-10 HA per month. Usually with stress. Some more wt gain noted (~10lbs). Also had a a laser eye procedure for angle closure glaucoma, now improved.  UPDATE 04/15/15: Since last visit, doing well. No more migraine since 02/28/15. Propranolol  is helping prevent HA. Mood stable.  UPDATE 01/13/15: Since last visit, having more headaches, and more fluid retention. Now being tapered off gabapentin  by psychiatry due to side effects.   UPDATE 09/02/14: Doing well. Avg 3-6 days HA per month. Some HA are mild, some are severe. Taking gabapentin  200mg  TID. Works out every day at [employer]. Overall mood is better as well.  UPDATE 05/17/14: Since last visit, patient had some psychiatry issues, was managed by behavioral health, and her headaches significant improved. She was doing well for several years and did not follow-up in our clinic. Patient here with her father today for this visit. Patient's father notes that her headaches seem to be significantly associated with her stress levels. In the last few months her stress levels have increased significantly. Her stress levels related to her ex-husband and her daughters, and some family issues. Patient having intermittent, almost daily left-sided headaches, with numbness and tingling in the left scalp, typically in the evening when she is home. During the daytime patient stays active, works out several times a week, and does better. Patient had been on gabapentin  100 mg at bedtime for  several years. Her psychiatrist increased this to 2 and then 3 capsules at bedtime. When she did 3 capsules at bedtime, her headache type symptoms paradoxical he worsened. She then went back to taking one capsule at bedtime. Strangely, she reports that she was told she was able to take this medication (gabapentin ) as needed as well and for the last few weeks has been taking one capsule at bedtime, followed by 1-2 capsules every hour, throughout the night, taking up to 10-14 capsules in a 12 hour period.   PRIOR HPI (11/04/08 - 06/03/09, VRP): 69 year old right-handed female with history of high blood pressure, depression and anxiety presenting for evaluation of chronic headaches and abnormal MRI scan.  On October 26, 2008 the patient presented to the emergency room for right-sided chest pain and was diagnosed with pneumonia.   At this time her headaches worsened in severity.  Upon review of prior MRIs demonstrating microvascular gliosis, patient was referred to our neurology clinic for futher evaluation. Patient's headaches began in 2005 consisting of a dull pressure like sensation over the top of her head. Occasionally these are associated with mild nausea without vomiting as well as intermittent left facial numbness.  Patient had dull nagging low level headaches on a daily basis with daily  flareups involving a hot sensation over her scalp.  Sometimes this sensation starts as posterior neck pain that moves up the back of her head. She was initially evaluated with MRI scans in 2005 and 2006 and started on topiramate 100 mg at bedtime.   In addition she was taking Advil  and Tylenol  over-the-counter almost a daily basis.   During flareups the patient would have to lay down in place ice packs over her eyes and the top of her head.   Patient's headaches are aggravated by lack of sleep or stress. No food triggers noted.  Her other physicians decided to taper her off of NSAIDs, tylenol  and also her topiramate. Patient had  MRI of the head and neck, found to have some nonspecific white matter lesions. Patient had lumbar puncture and additional blood testing, but multiple sclerosis was ruled out. Patient was treated with several occipital nerve blocks with good results.   REVIEW OF SYSTEMS: Full 14 system review of systems performed and negative except for: as per HPI.   ALLERGIES: No Known Allergies  HOME MEDICATIONS: Outpatient Medications Prior to Visit  Medication Sig Dispense Refill   acetaminophen  (TYLENOL ) 325 MG tablet 1 tablet as needed Orally every 6 hrs     atorvastatin  (LIPITOR) 20 MG tablet TAKE 1 TABLET BY MOUTH EVERY DAY 90 tablet 1   Cholecalciferol  (VITAMIN D3 PO) Take 5,000 Units by mouth.     clonazePAM  (KLONOPIN ) 0.5 MG tablet Take 0.5 mg by mouth daily as needed.     diclofenac Sodium (VOLTAREN) 1 % GEL Apply 1 Application topically 4 (four) times daily as needed.     dicyclomine  (BENTYL ) 10 MG capsule Take 1 capsule (10 mg total) by mouth 3 (three) times daily before meals. 90 capsule 1   ELDERBERRY PO Take by mouth.     escitalopram  (LEXAPRO ) 10 MG tablet 1 tablet Orally Once a day     famotidine (PEPCID) 20 MG tablet Take 20 mg by mouth 2 (two) times daily.     lactulose  (CHRONULAC ) 10 GM/15ML solution Take 45 mLs (30 g total) by mouth 2 (two) times daily. 1800 mL 5   latanoprost  (XALATAN ) 0.005 % ophthalmic solution Place 1 drop into both eyes at bedtime. 2.5 mL 12   linaclotide  (LINZESS ) 290 MCG CAPS capsule Take 1 capsule (290 mcg total) by mouth daily before breakfast. 30 capsule 5   Misc Natural Products (JOINT HEALTH PO) Take by mouth.     Multiple Vitamins-Minerals (WOMENS MULTIVITAMIN PO) Take by mouth.     pantoprazole  (PROTONIX ) 40 MG tablet Take 1 tablet (40 mg total) by mouth daily. 30 tablet 5   Probiotic Product (PROBIOTIC PO) Take by mouth.     QUEtiapine  (SEROQUEL  XR) 400 MG 24 hr tablet Take 1 tablet (400 mg total) by mouth at bedtime. (Patient taking differently: Take  800 mg by mouth at bedtime.) 30 tablet 1   rizatriptan  (MAXALT -MLT) 10 MG disintegrating tablet Take 1 tablet (10 mg total) by mouth as needed for migraine (May repeat in 2 hours if needed). 10 tablet 12   Sennosides (SENOKOT PO) Take 20 mg by mouth in the morning and at bedtime.     temazepam  (RESTORIL ) 15 MG capsule Take 1 capsule (15 mg total) by mouth at bedtime. 30 capsule 0   triamterene -hydrochlorothiazide  (MAXZIDE -25) 37.5-25 MG tablet TAKE 1 TABLET BY MOUTH EVERY DAY 90 tablet 1   Turmeric (QC TUMERIC COMPLEX) 500 MG CAPS Take by mouth.     UNABLE TO  FIND Take 2 tablets by mouth at bedtime. Med Name: magnesium  L theronate  650 mg at HS     zinc gluconate 50 MG tablet Take 50 mg by mouth daily.     Fremanezumab -vfrm (AJOVY ) 225 MG/1.5ML SOAJ Inject 225 mg into the skin every 30 (thirty) days. 4.5 mL 4   Rimegepant Sulfate  (NURTEC) 75 MG TBDP Take 1 tablet (75 mg total) by mouth as needed (for breakthrough migraine). 8 tablet 8   No facility-administered medications prior to visit.    PAST MEDICAL HISTORY: Past Medical History:  Diagnosis Date   Allergy    Anxiety    Asthma    Depression    Hyperlipidemia    Hypertension    Migraines    Osteopenia after menopause    Persistent headaches    Pneumonia 2020   Postherpetic neuralgia    Renal disorder    Shingles outbreak 02/2014   Skin cancer    moles on right groin and right buttock.   Slurred speech 09/09/2022    PAST SURGICAL HISTORY: Past Surgical History:  Procedure Laterality Date   CARDIOVASCULAR STRESS TEST  02/2009   treadmill stress test: Low risk   CESAREAN SECTION  1987, 1989   COLONOSCOPY     2014/2015 [place], normal   COLONOSCOPY WITH PROPOFOL  N/A 10/20/2021   Procedure: COLONOSCOPY WITH PROPOFOL ;  Surgeon: Therisa Bi, MD;  Location: [facility] ENDOSCOPY;  Service: Gastroenterology;  Laterality: N/A;   COLONOSCOPY WITH PROPOFOL  N/A 09/02/2022   Procedure: COLONOSCOPY WITH PROPOFOL ;  Surgeon: Unk Corinn Skiff, MD;  Location[facility] ENDOSCOPY;  Service: Gastroenterology;  Laterality: N/A;  Requests about 11:15 arrival   DIAGNOSTIC LAPAROSCOPY     ESOPHAGOGASTRODUODENOSCOPY (EGD) WITH PROPOFOL  N/A 09/02/2022   Procedure: ESOPHAGOGASTRODUODENOSCOPY (EGD) WITH PROPOFOL ;  Surgeon: Unk Corinn Skiff, MD;  Location: [facility] ENDOSCOPY;  Service: Gastroenterology;  Laterality: N/A;   EXPLORATORY LAPAROTOMY     laproscopy for infertility   EYE SURGERY Bilateral    laser-correct opening b/tn cornea and iris   MOLE REMOVAL  2017   x 2 moles    FAMILY HISTORY: Family History  Problem Relation Age of Onset   Hypertension Mother    Coronary artery disease Mother    Heart failure Mother    Transient ischemic attack Mother    Hypertension Father    Liver disease Sister        liver failure   Migraines Sister    Arrhythmia Brother    Hypertension Brother    Cancer Neg Hx        colon    Breast cancer Neg Hx     SOCIAL HISTORY:  Social History   Socioeconomic History   Marital status: Legally Separated    Spouse name: Not on file   Number of children: 2   Years of education: Not on file   Highest education level: 12th grade  Occupational History   Occupation: Geologist, engineering   Occupation: retired  Tobacco Use   Smoking status: Never   Smokeless tobacco: Never  Vaping Use   Vaping status: Never Used  Substance and Sexual Activity   Alcohol  use: No    Alcohol /week: 0.0 standard drinks of alcohol    Drug use: No   Sexual activity: Not Currently  Other Topics Concern   Not on file  Social History Narrative   Regular exercise- yes, spins daily   Diet: fruits and veggies, water     No caffeine   Lives alone    Retired  Social Drivers of Health   Tobacco Use: Low Risk (02/07/2024)   Patient History    Smoking Tobacco Use: Never    Smokeless Tobacco Use: Never    Passive Exposure: Not on file  Financial Resource Strain: Low Risk (06/14/2023)   Overall Financial Resource Strain  (CARDIA)    Difficulty of Paying Living Expenses: Not hard at all  Food Insecurity: No Food Insecurity (06/14/2023)   Hunger Vital Sign    Worried About Running Out of Food in the Last Year: Never true    Ran Out of Food in the Last Year: Never true  Transportation Needs: No Transportation Needs (06/14/2023)   PRAPARE - Administrator, Civil Service (Medical): No    Lack of Transportation (Non-Medical): No  Physical Activity: Insufficiently Active (06/14/2023)   Exercise Vital Sign    Days of Exercise per Week: 3 days    Minutes of Exercise per Session: 20 min  Stress: Stress Concern Present (06/14/2023)   [employer] of Occupational Health - Occupational Stress Questionnaire    Feeling of Stress : To some extent  Social Connections: Moderately Integrated (06/14/2023)   Social Connection and Isolation Panel    Frequency of Communication with Friends and Family: Twice a week    Frequency of Social Gatherings with Friends and Family: Once a week    Attends Religious Services: More than 4 times per year    Active Member of [employer] or Organizations: Yes    Attends Banker Meetings: More than 4 times per year    Marital Status: Separated  Intimate Partner Violence: Not At Risk (06/14/2023)   Humiliation, Afraid, Rape, and Kick questionnaire    Fear of Current or Ex-Partner: No    Emotionally Abused: No    Physically Abused: No    Sexually Abused: No  Depression (PHQ2-9): Medium Risk (06/14/2023)   Depression (PHQ2-9)    PHQ-2 Score: 7  Alcohol  Screen: Low Risk (06/14/2023)   Alcohol  Screen    Last Alcohol  Screening Score (AUDIT): 0  Housing: Low Risk (06/14/2023)   Housing Stability Vital Sign    Unable to Pay for Housing in the Last Year: No    Number of Times Moved in the Last Year: 0    Homeless in the Last Year: No  Utilities: Not At Risk (06/14/2023)   AHC Utilities    Threatened with loss of utilities: No  Health Literacy: Adequate Health Literacy  (06/14/2023)   B1300 Health Literacy    Frequency of need for help with medical instructions: Never     PHYSICAL EXAM  There were no vitals filed for this visit.   Wt Readings from Last 3 Encounters:  02/07/24 120 lb 9.6 oz (54.7 kg)  08/31/23 123 lb (55.8 kg)  05/30/23 119 lb (54 kg)   There is no height or weight on file to calculate BMI.  No results found.     06/03/2016    3:23 PM  MMSE - Mini Mental State Exam  Orientation to time 5   Orientation to Place 5   Registration 3   Attention/ Calculation 5   Recall 3   Language- name 2 objects 2   Language- repeat 1  Language- follow 3 step command 3   Language- read & follow direction 1   Write a sentence 1   Copy design 1   Total score 30      Data saved with a previous flowsheet row definition    GENERAL  EXAM: Patient is in no distress; well developed, nourished and groomed; neck is supple  CARDIOVASCULAR: Regular rate and rhythm, no murmurs, no carotid bruits  NEUROLOGIC: MENTAL STATUS: awake, alert, language fluent, comprehension intact, naming intact, fund of knowledge appropriate CRANIAL NERVE: pupils equal and reactive to light, visual fields full to confrontation, extraocular muscles intact, no nystagmus, facial sensation and strength symmetric, hearing intact, palate elevates symmetrically, uvula midline, shoulder shrug symmetric, tongue midline. MOTOR: normal bulk and tone, full strength in the BUE, BLE SENSORY: normal and symmetric to light touch, temperature, vibration COORDINATION: finger-nose-finger, fine finger movements normal REFLEXES: deep tendon reflexes present and symmetric GAIT/STATION: narrow based gait; romberg is negative    DIAGNOSTIC DATA (LABS, IMAGING, TESTING) - I reviewed patient records, labs, notes, testing and imaging myself where available.  Lab Results  Component Value Date   WBC 6.1 08/31/2023   HGB 12.7 08/31/2023   HCT 38.3 08/31/2023   MCV 89.7 08/31/2023   PLT  266.0 08/31/2023      Component Value Date/Time   NA 133 (L) 08/31/2023 1551   NA 140 12/10/2020 0954   K 4.2 08/31/2023 1551   CL 98 08/31/2023 1551   CO2 30 08/31/2023 1551   GLUCOSE 95 08/31/2023 1551   BUN 19 08/31/2023 1551   BUN 18 12/10/2020 0954   CREATININE 0.66 08/31/2023 1551   CREATININE 0.90 06/08/2017 1534   CALCIUM  9.0 08/31/2023 1551   PROT 7.3 08/31/2023 1551   PROT 6.8 12/10/2020 0954   ALBUMIN 4.4 08/31/2023 1551   ALBUMIN 4.6 12/10/2020 0954   AST 23 08/31/2023 1551   ALT 20 08/31/2023 1551   ALKPHOS 73 08/31/2023 1551   BILITOT 0.3 08/31/2023 1551   BILITOT <0.2 12/10/2020 0954   GFRNONAA >60 09/09/2022 1300   GFRAA >60 02/11/2019 0702   Lab Results  Component Value Date   CHOL 189 04/13/2023   HDL 65.00 04/13/2023   LDLCALC 108 (H) 04/13/2023   TRIG 81.0 04/13/2023   CHOLHDL 3 04/13/2023   Lab Results  Component Value Date   HGBA1C 5.2 09/09/2022   Lab Results  Component Value Date   VITAMINB12 1,257 (H) 01/12/2021   Lab Results  Component Value Date   TSH 0.73 04/13/2023    11/13/08 LP - opening pressure 7cm H2O; WBC 1, RBC 0, glucose 62, protein 26, OCB (2 in CSF, not seen in serum), IgG index 0.5 (normal), lyme PCR neg, EBV PCR neg  11/13/08 VEP - normal  11/08/08 MRI cervical - normal  11/08/08 MRI brain (with and without contrast) - multiple supratentorial, periventricular and juxtacortical white matter lesions which may represent chronic demyelinating plaques or perivascular gliosis.  No abnormal enhancement on postcontrast views.   08/28/17 MRI brain  1.  Scattered T2/FLAIR hyperintense foci predominantly in the deep and subcortical white matter.  This is a nonspecific finding and most likely represents chronic microvascular ischemic change.  The pattern is not typical for demyelination.  The foci are not acute and they do not enhance after contrast.  When compared to the MRI dated 05/20/2004, there has been only slight progression in the  number or size of the foci. 2.  There is a normal enhancement pattern and there are no acute findings.   09/30/18 MRI brain [I reviewed images myself and agree with interpretation. -VRP]  - No acute or subacute infarction. - Numerous scattered foci of T2 and FLAIR signal within the cerebral hemispheric deep and subcortical white matter. The differential diagnosis on  the basis of the imaging would be demyelinating disease versus small-vessel disease. Findings appear stable since June of last year but are progressive since 2006. Given the history of hypertension and hyperlipidemia, small-vessel disease is probably more likely  09/21/19 MRI brain - Abnormal MRI scan of the brain showing bilateral nonspecific periventricular and subcortical white matter hyperintensities with a differential discussed above.  No enhancing lesions are noted.  There are mild incidental changes of chronic paranasal sinus inflammation.  Overall no significant change compared with previous MRI from 09/30/2018  12/16/20: This MRI of the brain with and without contrast shows the following: 1.   Scattered T2/FLAIR hyperintense foci predominantly in the subcortical and deep white matter of both hemispheres.  This is a nonspecific finding but is most consistent with chronic microvascular ischemic change.  Demyelination or vasculitis would be less likely to have this pattern.  None of the foci appear to be acute.  They do not enhance.  Compared to the MRI dated 09/21/2019, there are no new lesions. 2.   Normal enhancement pattern.  No acute findings.  12/24/20 EMG/NCS - Normal study.  No electrodiagnostic evidence of large fiber neuropathy or myopathy at this time.   09/09/22 MRI brain No acute intracranial process. No evidence of acute or subacute infarct.  09/09/22 CTA head / neck 1. Beaded appearance of the cervical internal carotid arteries bilaterally consistent with fibromuscular dysplasia. 2. No significant associated  stenosis. 3. Atherosclerotic changes at the left carotid bifurcation without significant stenosis. 4. Normal variant CTA Circle of Willis without significant proximal stenosis, aneurysm, or branch vessel occlusion.    ASSESSMENT AND PLAN  69 y.o. year old female here with mixed tension and migraine headaches, left occipital neuralgia, and longer standing significant depression/anxiety. Some headaches have occipital neuralgia type features. Symptoms seem to be worse with stress and external factors.   Meds tried: botox, gabapentin , propranolol , indomethacin , ibuprofen , tylenol , toradol , rizatriptan , eletriptan , nurtec  Dx:  Migraine with aura and without status migrainosus, not intractable  Numbness    PLAN:  MIGRAINE WITH AURA (history of angle closure glaucoma; cannot use topiramate)  MIGRAINE PREVENTION  LIFESTYLE CHANGES -Stop or avoid smoking -Decrease or avoid caffeine / alcohol  -Eat and sleep on a regular schedule -Exercise several times per week - continue fremanezumab  (Ajovy ) 225mg  monthly - consider qulipta, aimovig or emgality in future  MIGRAINE RESCUE  - tylenol  as needed (cannot take ibuprofen  due to ulcers) - rizatriptan  10mg  and nurtec 75mg  as needed   NUMBNESS (scalp, tongue, toes; migraine phenomenon, stress reaction or benign paresthesias) - non-specific; MRI and labs unremarkable; neuro exam was unremarkable at last visit - follow up rheumatology for ANA positive - follow up with psychiatry (anxiety, stress reaction)  MILD SLEEP APNEA - follow up with pulmonary / ENT / PCP (already had home sleep study; now on oxygen )  Schizoaffective schizophrenia / anxiety / depression (per psychiatry) - continue seroquel  800mg  at bedtime  Meds ordered this encounter  Medications   Fremanezumab -vfrm (AJOVY ) 225 MG/1.5ML SOAJ    Sig: Inject 225 mg into the skin every 30 (thirty) days.    Dispense:  4.5 mL    Refill:  4   Rimegepant Sulfate  (NURTEC) 75 MG TBDP     Sig: Take 1 tablet (75 mg total) by mouth as needed (for breakthrough migraine).    Dispense:  8 tablet    Refill:  8   Return in about 1 year (around 04/09/2025) for MyChart visit (15 min).  Virtual Visit  via Video Note  I connected with Theresa Martin on 04/09/2024 at  1:30 PM EST by a video enabled telemedicine application and verified that I am speaking with the correct person using two identifiers.   I discussed the limitations of evaluation and management by telemedicine and the availability of in person appointments. The patient expressed understanding and agreed to proceed.  Patient is at home and I am at the office.   I spent 15 minutes of face-to-face and non-face-to-face time with patient.  This included previsit chart review, lab review, study review, order entry, electronic health record documentation, patient education.      EDUARD FABIENE HANLON, MD 04/09/2024, 2:00 PM Certified in Neurology, Neurophysiology and Neuroimaging  [facility] [address] [place], [place] [address] [phone] "

## 2024-04-09 NOTE — Telephone Encounter (Signed)
 Copied from CRM [id number]. Topic: General - Call Back - No Documentation >> Apr 09, 2024  9:49 AM Rea C wrote: Reason for CRM: Patient would like a call back to discuss when to take fish oils again because she bought a new one and would like to know how to take between one or two depending on the dosage she bought.    [phone] (M)

## 2024-04-10 ENCOUNTER — Telehealth: Payer: Self-pay

## 2024-04-10 ENCOUNTER — Telehealth: Payer: Self-pay | Admitting: Diagnostic Neuroimaging

## 2024-04-10 DIAGNOSIS — J4520 Mild intermittent asthma, uncomplicated: Secondary | ICD-10-CM

## 2024-04-10 MED ORDER — ALBUTEROL SULFATE HFA 108 (90 BASE) MCG/ACT IN AERS: 1.0000 | INHALATION_SPRAY | Freq: Four times a day (QID) | RESPIRATORY_TRACT | 3 refills | Status: AC | PRN

## 2024-04-10 NOTE — Telephone Encounter (Signed)
 Copied from CRM [id number]. Topic: Clinical - Medication Question >> Apr 10, 2024 10:31 AM Rilla B wrote: Reason for CRM: Patients her Symbicort  and Albuterol  inhalers have expired.  She don't use them very often and would like to know from Dr Isaiah IF she should get a refill? Is there another inhaler she should have?  Please call patient [phone].

## 2024-04-10 NOTE — Telephone Encounter (Signed)
 Pt called to request medication refill rizatriptan  (MAXALT -MLT) 10 MG disintegrating tablet   Pt medication is to be sent   [id number] [address], [place] - [address] (Ph: [phone])

## 2024-04-10 NOTE — Addendum Note (Signed)
 Addended by: Vallory Oetken J on: 04/10/2024 01:18 PM   Modules accepted: Orders

## 2024-04-11 MED ORDER — NURTEC 75 MG PO TBDP: 75.0000 mg | ORAL_TABLET | ORAL | 8 refills | Status: AC | PRN

## 2024-04-11 MED ORDER — RIZATRIPTAN BENZOATE 10 MG PO TBDP: 10.0000 mg | ORAL_TABLET | ORAL | 12 refills | Status: AC | PRN

## 2024-04-11 NOTE — Telephone Encounter (Unsigned)
 Copied from CRM [id number]. Topic: Clinical - Medication Question >> Apr 11, 2024 12:29 PM Rozanna G wrote: Pt calling stated the Symbicort  was not filled and wants to know if this can be filled also. Please reach out to her and let her know if she should be taking this one or not.

## 2024-04-11 NOTE — Telephone Encounter (Signed)
 Pt called back and was informed again that her medication has been called in to the pharmacy she had requested.

## 2024-04-11 NOTE — Telephone Encounter (Signed)
 Medication has been sent in to patient's preferred pharmacy. I called and left a message to notify the patient.

## 2024-04-11 NOTE — Addendum Note (Signed)
 Addended by: Toshua Honsinger-JACKSON, Zacharius Funari L on: 04/11/2024 09:38 AM   Modules accepted: Orders

## 2024-04-16 ENCOUNTER — Telehealth: Payer: Self-pay | Admitting: Family Medicine

## 2024-04-16 NOTE — Telephone Encounter (Signed)
 Copied from CRM [id number]. Topic: General - Other >> Apr 16, 2024 11:16 AM Drema MATSU wrote: Reason for CRM: Patient wants provider to compose a letter for her to take her dog with her out of town as a emotional support animal. She wants to the letter include anxiety/depression in the letter. She is leaving out of town on Thursday.

## 2024-05-01 ENCOUNTER — Other Ambulatory Visit: Payer: Self-pay

## 2024-05-01 MED ORDER — PANTOPRAZOLE SODIUM 40 MG PO TBEC: 40.0000 mg | DELAYED_RELEASE_TABLET | Freq: Every day | ORAL | 0 refills | Status: AC

## 2024-05-03 ENCOUNTER — Ambulatory Visit

## 2024-05-03 ENCOUNTER — Encounter: Payer: Self-pay | Admitting: Diagnostic Neuroimaging

## 2024-05-03 ENCOUNTER — Encounter: Admitting: Family Medicine

## 2024-05-03 ENCOUNTER — Ambulatory Visit: Admitting: *Deleted

## 2024-05-03 VITALS — Ht 61.0 in | Wt 117.0 lb

## 2024-05-03 DIAGNOSIS — Z0000 Encounter for general adult medical examination without abnormal findings: Secondary | ICD-10-CM | POA: Diagnosis not present

## 2024-05-03 NOTE — Progress Notes (Signed)
 "  Chief Complaint  Patient presents with   Medicare Wellness     Subjective:   Theresa Martin is a 69 y.o. female who presents for a Medicare Annual Wellness Visit.  Visit info / Clinical Intake: Medicare Wellness Visit Type:: Subsequent Annual Wellness Visit Persons participating in visit and providing information:: patient Medicare Wellness Visit Mode:: Video Since this visit was completed virtually, some vitals may be partially provided or unavailable. Missing vitals are due to the limitations of the virtual format.: Unable to obtain vitals - no equipment If Telephone or Video please confirm:: I connected with patient using audio/video enable telemedicine. I verified patient identity with two identifiers, discussed telehealth limitations, and patient agreed to proceed. Patient Location:: home Provider Location:: office Interpreter Needed?: No Pre-visit prep was completed: no AWV questionnaire completed by patient prior to visit?: no Living arrangements:: (!) lives alone Patient's Overall Health Status Rating: good Typical amount of pain: none Does pain affect daily life?: no Are you currently prescribed opioids?: no  Dietary Habits and Nutritional Risks How many meals a day?: 3 Eats fruit and vegetables daily?: yes Most meals are obtained by: preparing own meals In the last 2 weeks, have you had any of the following?: none Diabetic:: no  Functional Status Activities of Daily Living (to include ambulation/medication): Independent Ambulation: Independent Medication Administration: Independent Home Management (perform basic housework or laundry): Independent Manage your own finances?: yes Primary transportation is: driving Concerns about vision?: no *vision screening is required for WTM* Concerns about hearing?: no  Fall Screening Falls in the past year?: 0 Number of falls in past year: 0 Was there an injury with Fall?: 0 Fall Risk Category Calculator: 0 Patient  Fall Risk Level: Low Fall Risk  Fall Risk Patient at Risk for Falls Due to: No Fall Risks Fall risk Follow up: Falls evaluation completed; Education provided; Falls prevention discussed  Home and Transportation Safety: All rugs have non-skid backing?: N/A, no rugs All stairs or steps have railings?: N/A, no stairs Grab bars in the bathtub or shower?: yes Have non-skid surface in bathtub or shower?: (!) no Good home lighting?: yes Regular seat belt use?: yes Hospital stays in the last year:: no  Cognitive Assessment Difficulty concentrating, remembering, or making decisions? : no Will 6CIT or Mini Cog be Completed: yes What year is it?: 0 points What month is it?: 0 points Give patient an address phrase to remember (5 components): Its very sunny outside today in February About what time is it?: 0 points Count backwards from 20 to 1: 0 points Say the months of the year in reverse: 2 points Repeat the address phrase from earlier: 0 points 6 CIT Score: 2 points  Advance Directives (For Healthcare) Does Patient Have a Medical Advance Directive?: Yes Type of Advance Directive: Healthcare Power of Attorney Copy of Healthcare Power of Attorney in Chart?: No - copy requested  Reviewed/Updated  Reviewed/Updated: Reviewed All (Medical, Surgical, Family, Medications, Allergies, Care Teams, Patient Goals); Surgical History; Family History; Medications; Allergies; Care Teams; Patient Goals; Medical History    Allergies (verified) Patient has no known allergies.   Current Medications (verified) Outpatient Encounter Medications as of 05/03/2024  Medication Sig   acetaminophen  (TYLENOL ) 325 MG tablet 1 tablet as needed Orally every 6 hrs   acyclovir  ointment (ZOVIRAX ) 5 % Apply 1 Application topically every 3 (three) hours.   albuterol  (VENTOLIN  HFA) 108 (90 Base) MCG/ACT inhaler Inhale 1-2 puffs into the lungs every 6 (six) hours as needed for  wheezing or shortness of breath (or cough).    atorvastatin  (LIPITOR) 20 MG tablet TAKE 1 TABLET BY MOUTH EVERY DAY   Cholecalciferol  (VITAMIN D3 PO) Take 5,000 Units by mouth.   clonazePAM  (KLONOPIN ) 0.5 MG tablet Take 0.5 mg by mouth daily as needed.   diclofenac Sodium (VOLTAREN) 1 % GEL Apply 1 Application topically 4 (four) times daily as needed.   ELDERBERRY PO Take by mouth.   escitalopram  (LEXAPRO ) 10 MG tablet 1 tablet Orally Once a day   famotidine (PEPCID) 20 MG tablet Take 20 mg by mouth 2 (two) times daily.   Fremanezumab -vfrm (AJOVY ) 225 MG/1.5ML SOAJ Inject 225 mg into the skin every 30 (thirty) days.   hydrocortisone 2.5 % ointment Apply topically 2 (two) times daily.   latanoprost  (XALATAN ) 0.005 % ophthalmic solution Place 1 drop into both eyes at bedtime.   linaclotide  (LINZESS ) 290 MCG CAPS capsule Take 1 capsule (290 mcg total) by mouth daily before breakfast.   Misc Natural Products (JOINT HEALTH PO) Take by mouth.   Multiple Vitamins-Minerals (WOMENS MULTIVITAMIN PO) Take by mouth.   pantoprazole  (PROTONIX ) 40 MG tablet Take 1 tablet (40 mg total) by mouth daily.   Probiotic Product (PROBIOTIC PO) Take by mouth.   QUEtiapine  (SEROQUEL  XR) 400 MG 24 hr tablet Take 1 tablet (400 mg total) by mouth at bedtime.   Rimegepant Sulfate  (NURTEC) 75 MG TBDP Take 1 tablet (75 mg total) by mouth as needed (for breakthrough migraine).   rizatriptan  (MAXALT -MLT) 10 MG disintegrating tablet Take 1 tablet (10 mg total) by mouth as needed for migraine (May repeat in 2 hours if needed).   Sennosides (SENOKOT PO) Take 20 mg by mouth in the morning and at bedtime.   temazepam  (RESTORIL ) 15 MG capsule Take 1 capsule (15 mg total) by mouth at bedtime.   triamterene -hydrochlorothiazide  (MAXZIDE -25) 37.5-25 MG tablet TAKE 1 TABLET BY MOUTH EVERY DAY   Turmeric (QC TUMERIC COMPLEX) 500 MG CAPS Take by mouth.   UNABLE TO FIND Take 2 tablets by mouth at bedtime. Med Name: magnesium  L theronate  650 mg at HS   dicyclomine  (BENTYL ) 10 MG capsule  Take 1 capsule (10 mg total) by mouth 3 (three) times daily before meals.   lactulose  (CHRONULAC ) 10 GM/15ML solution Take 45 mLs (30 g total) by mouth 2 (two) times daily.   mometasone  (ELOCON ) 0.1 % ointment Apply topically daily.   zinc gluconate 50 MG tablet Take 50 mg by mouth daily.   No facility-administered encounter medications on file as of 05/03/2024.    History: Past Medical History:  Diagnosis Date   Allergy    Anxiety    Asthma    Depression    Hyperlipidemia    Hypertension    Migraines    Osteopenia after menopause    Persistent headaches    Pneumonia 2020   Postherpetic neuralgia    Renal disorder    Shingles outbreak 02/2014   Skin cancer    moles on right groin and right buttock.   Slurred speech 09/09/2022   Past Surgical History:  Procedure Laterality Date   CARDIOVASCULAR STRESS TEST  02/2009   treadmill stress test: Low risk   CESAREAN SECTION  1987, 1989   COLONOSCOPY     2014/2015 [facility], normal   COLONOSCOPY WITH PROPOFOL  N/A 10/20/2021   Procedure: COLONOSCOPY WITH PROPOFOL ;  Surgeon: Therisa Bi, MD;  Location[facility] ENDOSCOPY;  Service: Gastroenterology;  Laterality: N/A;   COLONOSCOPY WITH PROPOFOL  N/A 09/02/2022  Procedure: COLONOSCOPY WITH PROPOFOL ;  Surgeon: Unk Corinn Skiff, MD;  Location: [facility] ENDOSCOPY;  Service: Gastroenterology;  Laterality: N/A;  Requests about 11:15 arrival   DIAGNOSTIC LAPAROSCOPY     ESOPHAGOGASTRODUODENOSCOPY (EGD) WITH PROPOFOL  N/A 09/02/2022   Procedure: ESOPHAGOGASTRODUODENOSCOPY (EGD) WITH PROPOFOL ;  Surgeon: Unk Corinn Skiff, MD;  Location: [facility] ENDOSCOPY;  Service: Gastroenterology;  Laterality: N/A;   EXPLORATORY LAPAROTOMY     laproscopy for infertility   EYE SURGERY Bilateral    laser-correct opening b/tn cornea and iris   MOLE REMOVAL  2017   x 2 moles   Family History  Problem Relation Age of Onset   Hypertension Mother    Coronary artery disease Mother    Heart failure Mother    Transient  ischemic attack Mother    Hypertension Father    Liver disease Sister        liver failure   Migraines Sister    Arrhythmia Brother    Hypertension Brother    Cancer Neg Hx        colon    Breast cancer Neg Hx    Social History   Occupational History   Occupation: Geologist, engineering   Occupation: retired  Tobacco Use   Smoking status: Never   Smokeless tobacco: Never  Vaping Use   Vaping status: Never Used  Substance and Sexual Activity   Alcohol  use: No    Alcohol /week: 0.0 standard drinks of alcohol    Drug use: No   Sexual activity: Not Currently   Tobacco Counseling Counseling given: Not Answered  SDOH Screenings   Food Insecurity: No Food Insecurity (05/03/2024)  Housing: Low Risk (05/03/2024)  Transportation Needs: No Transportation Needs (05/03/2024)  Utilities: Not At Risk (05/03/2024)  Alcohol  Screen: Low Risk (06/14/2023)  Depression (PHQ2-9): Low Risk (05/03/2024)  Financial Resource Strain: Low Risk (06/14/2023)  Physical Activity: Sufficiently Active (05/03/2024)  Social Connections: Moderately Integrated (05/03/2024)  Stress: No Stress Concern Present (05/03/2024)  Tobacco Use: Low Risk (04/09/2024)  Health Literacy: Adequate Health Literacy (05/03/2024)   See flowsheets for full screening details  Depression Screen PHQ 2 & 9 Depression Scale- Over the past 2 weeks, how often have you been bothered by any of the following problems? Little interest or pleasure in doing things: 0 Feeling down, depressed, or hopeless (PHQ Adolescent also includes...irritable): 0 PHQ-2 Total Score: 0 Trouble falling or staying asleep, or sleeping too much: 0 Feeling tired or having little energy: 0 Poor appetite or overeating (PHQ Adolescent also includes...weight loss): 0 Feeling bad about yourself - or that you are a failure or have let yourself or your family down: 0 Trouble concentrating on things, such as reading the newspaper or watching television (PHQ Adolescent also includes...like  school work): 0 Moving or speaking so slowly that other people could have noticed. Or the opposite - being so fidgety or restless that you have been moving around a lot more than usual: 0 Thoughts that you would be better off dead, or of hurting yourself in some way: 0 PHQ-9 Total Score: 0 If you checked off any problems, how difficult have these problems made it for you to do your work, take care of things at home, or get along with other people?: Not difficult at all     Goals Addressed             This Visit's Progress    Patient Stated       [place] current lifestyle  Objective:    Today's Vitals   05/03/24 1206  Weight: 117 lb (53.1 kg)  Height: 5' 1 (1.549 m)   Body mass index is 22.11 kg/m.  Hearing/Vision screen Hearing Screening - Comments:: No trouble hearing Vision Screening - Comments:: [facility] Up to date Immunizations and Health Maintenance Health Maintenance  Topic Date Due   DTaP/Tdap/Td (2 - Td or Tdap) 07/28/2022   COVID-19 Vaccine (5 - 2025-26 season) 11/28/2023   Mammogram  11/08/2024   Pneumococcal Vaccine: 50+ Years (3 of 3 - PCV20 or PCV21) 01/16/2025   Medicare Annual Wellness (AWV)  05/03/2025   Colonoscopy  09/02/2027   Influenza Vaccine  Completed   Bone Density Scan  Completed   Hepatitis C Screening  Completed   Zoster Vaccines- Shingrix  Completed   Meningococcal B Vaccine  Aged Out        Assessment/Plan:  This is a routine wellness examination for Beya.  Patient Care Team: Mahlon Comer BRAVO, MD as PCP - General (Family Medicine) Rilla Barnacle, CNM (Inactive) as Midwife (Certified Nurse Midwife) Dessa, Reyes ORN, MD (General Surgery) Laurence Fallow, MD (Psychiatry) Margaret Eduard SAUNDERS, MD as Consulting Physician (Neurology) Jakie Alm SAUNDERS, MD as Consulting Physician (Gastroenterology) Rollo Matsu (Psychiatry) Wells Minerva [facility]) Dermatology, [facility]  (Dermatology) Marquette Ozell BIRCH, DO as Consulting Physician (Family Medicine) Cary No, NP as Nurse Practitioner (Neurology) Isaiah Scrivener, MD as Consulting Physician (Pulmonary Disease) Pandora Cadet, [facility] as Pharmacist (Pharmacist)  I have personally reviewed and noted the following in the patients chart:   Medical and social history Use of alcohol , tobacco or illicit drugs  Current medications and supplements including opioid prescriptions. Functional ability and status Nutritional status Physical activity Advanced directives List of other physicians Hospitalizations, surgeries, and ER visits in previous 12 months Vitals Screenings to include cognitive, depression, and falls Referrals and appointments  No orders of the defined types were placed in this encounter.  In addition, I have reviewed and discussed with patient certain preventive protocols, quality metrics, and best practice recommendations. A written personalized care plan for preventive services as well as general preventive health recommendations were provided to patient.   Beva Remund, LPN   09/30/7971   Return in 1 year (on 05/03/2025).  After Visit Summary: (MyChart) Due to this being a telephonic visit, the after visit summary with patients personalized plan was offered to patient via MyChart   Nurse Notes:  "

## 2024-05-03 NOTE — Patient Instructions (Signed)
 Theresa Martin,  Thank you for taking the time for your Medicare Wellness Visit. I appreciate your continued commitment to your health goals. Please review the care plan we discussed, and feel free to reach out if I can assist you further.  Please note that Annual Wellness Visits do not include a physical exam. Some assessments may be limited, especially if the visit was conducted virtually. If needed, we may recommend an in-person follow-up with your provider.  Ongoing Care Seeing your primary care provider every 3 to 6 months helps us  monitor your health and provide consistent, personalized care.   Referrals If a referral was made during today's visit and you haven't received any updates within two weeks, please contact the referred provider directly to check on the status.  Recommended Screenings:  Health Maintenance  Topic Date Due   DTaP/Tdap/Td vaccine (2 - Td or Tdap) 07/28/2022   COVID-19 Vaccine (5 - 2025-26 season) 11/28/2023   Breast Cancer Screening  11/08/2024   Pneumococcal Vaccine for age over 36 (3 of 3 - PCV20 or PCV21) 01/16/2025   Medicare Annual Wellness Visit  05/03/2025   Colon Cancer Screening  09/02/2027   Flu Shot  Completed   Osteoporosis screening with Bone Density Scan  Completed   Hepatitis C Screening  Completed   Zoster (Shingles) Vaccine  Completed   Meningitis B Vaccine  Aged Out       05/03/2024   12:08 PM  Advanced Directives  Does Patient Have a Medical Advance Directive? Yes  Type of Advance Directive Healthcare Power of Attorney  Copy of Healthcare Power of Attorney in Chart? No - copy requested    Vision: Annual vision screenings are recommended for early detection of glaucoma, cataracts, and diabetic retinopathy. These exams can also reveal signs of chronic conditions such as diabetes and high blood pressure.  Dental: Annual dental screenings help detect early signs of oral cancer, gum disease, and other conditions linked to overall health,  including heart disease and diabetes.  Please see the attached documents for additional preventive care recommendations.    Theresa Martin , Thank you for taking time to come for your Medicare Wellness Visit. I appreciate your ongoing commitment to your health goals. Please review the following plan we discussed and let me know if I can assist you in the future.   Screening recommendations/referrals: Colonoscopy:  Mammogram:  Bone Density:  Recommended yearly ophthalmology/optometry visit for glaucoma screening and checkup Recommended yearly dental visit for hygiene and checkup  Vaccinations: Influenza vaccine:  Pneumococcal vaccine:  Tdap vaccine:  Shingles vaccine:        Preventive Care 65 Years and Older, Female Preventive care refers to lifestyle choices and visits with your health care provider that can promote health and wellness. What does preventive care include? A yearly physical exam. This is also called an annual well check. Dental exams once or twice a year. Routine eye exams. Ask your health care provider how often you should have your eyes checked. Personal lifestyle choices, including: Daily care of your teeth and gums. Regular physical activity. Eating a healthy diet. Avoiding tobacco and drug use. Limiting alcohol  use. Practicing safe sex. Taking low-dose aspirin  every day. Taking vitamin and mineral supplements as recommended by your health care provider. What happens during an annual well check? The services and screenings done by your health care provider during your annual well check will depend on your age, overall health, lifestyle risk factors, and family history of disease. Counseling  Your  health care provider may ask you questions about your: Alcohol  use. Tobacco use. Drug use. Emotional well-being. Home and relationship well-being. Sexual activity. Eating habits. History of falls. Memory and ability to understand (cognition). Work and  work astronomer. Reproductive health. Screening  You may have the following tests or measurements: Height, weight, and BMI. Blood pressure. Lipid and cholesterol levels. These may be checked every 5 years, or more frequently if you are over 45 years old. Skin check. Lung cancer screening. You may have this screening every year starting at age 15 if you have a 30-pack-year history of smoking and currently smoke or have quit within the past 15 years. Fecal occult blood test (FOBT) of the stool. You may have this test every year starting at age 36. Flexible sigmoidoscopy or colonoscopy. You may have a sigmoidoscopy every 5 years or a colonoscopy every 10 years starting at age 21. Hepatitis C blood test. Hepatitis B blood test. Sexually transmitted disease (STD) testing. Diabetes screening. This is done by checking your blood sugar (glucose) after you have not eaten for a while (fasting). You may have this done every 1-3 years. Bone density scan. This is done to screen for osteoporosis. You may have this done starting at age 35. Mammogram. This may be done every 1-2 years. Talk to your health care provider about how often you should have regular mammograms. Talk with your health care provider about your test results, treatment options, and if necessary, the need for more tests. Vaccines  Your health care provider may recommend certain vaccines, such as: Influenza vaccine. This is recommended every year. Tetanus, diphtheria, and acellular pertussis (Tdap, Td) vaccine. You may need a Td booster every 10 years. Zoster vaccine. You may need this after age 31. Pneumococcal 13-valent conjugate (PCV13) vaccine. One dose is recommended after age 40. Pneumococcal polysaccharide (PPSV23) vaccine. One dose is recommended after age 107. Talk to your health care provider about which screenings and vaccines you need and how often you need them. This information is not intended to replace advice given to you  by your health care provider. Make sure you discuss any questions you have with your health care provider. Document Released: 04/11/2015 Document Revised: 12/03/2015 Document Reviewed: 01/14/2015 Elsevier Interactive Patient Education  2017 [employer].  Fall Prevention in the Home Falls can cause injuries. They can happen to people of all ages. There are many things you can do to make your home safe and to help prevent falls. What can I do on the outside of my home? Regularly fix the edges of walkways and driveways and fix any cracks. Remove anything that might make you trip as you walk through a door, such as a raised step or threshold. Trim any bushes or trees on the path to your home. Use bright outdoor lighting. Clear any walking paths of anything that might make someone trip, such as rocks or tools. Regularly check to see if handrails are loose or broken. Make sure that both sides of any steps have handrails. Any raised decks and porches should have guardrails on the edges. Have any leaves, snow, or ice cleared regularly. Use sand or salt on walking paths during winter. Clean up any spills in your garage right away. This includes oil or grease spills. What can I do in the bathroom? Use night lights. Install grab bars by the toilet and in the tub and shower. Do not use towel bars as grab bars. Use non-skid mats or decals in the tub or  shower. If you need to sit down in the shower, use a plastic, non-slip stool. Keep the floor dry. Clean up any water  that spills on the floor as soon as it happens. Remove soap buildup in the tub or shower regularly. Attach bath mats securely with double-sided non-slip rug tape. Do not have throw rugs and other things on the floor that can make you trip. What can I do in the bedroom? Use night lights. Make sure that you have a light by your bed that is easy to reach. Do not use any sheets or blankets that are too big for your bed. They should not  hang down onto the floor. Have a firm chair that has side arms. You can use this for support while you get dressed. Do not have throw rugs and other things on the floor that can make you trip. What can I do in the kitchen? Clean up any spills right away. Avoid walking on wet floors. Keep items that you use a lot in easy-to-reach places. If you need to reach something above you, use a strong step stool that has a grab bar. Keep electrical cords out of the way. Do not use floor polish or wax that makes floors slippery. If you must use wax, use non-skid floor wax. Do not have throw rugs and other things on the floor that can make you trip. What can I do with my stairs? Do not leave any items on the stairs. Make sure that there are handrails on both sides of the stairs and use them. Fix handrails that are broken or loose. Make sure that handrails are as long as the stairways. Check any carpeting to make sure that it is firmly attached to the stairs. Fix any carpet that is loose or worn. Avoid having throw rugs at the top or bottom of the stairs. If you do have throw rugs, attach them to the floor with carpet tape. Make sure that you have a light switch at the top of the stairs and the bottom of the stairs. If you do not have them, ask someone to add them for you. What else can I do to help prevent falls? Wear shoes that: Do not have high heels. Have rubber bottoms. Are comfortable and fit you well. Are closed at the toe. Do not wear sandals. If you use a stepladder: Make sure that it is fully opened. Do not climb a closed stepladder. Make sure that both sides of the stepladder are locked into place. Ask someone to hold it for you, if possible. Clearly mark and make sure that you can see: Any grab bars or handrails. First and last steps. Where the edge of each step is. Use tools that help you move around (mobility aids) if they are needed. These  include: Canes. Walkers. Scooters. Crutches. Turn on the lights when you go into a dark area. Replace any light bulbs as soon as they burn out. Set up your furniture so you have a clear path. Avoid moving your furniture around. If any of your floors are uneven, fix them. If there are any pets around you, be aware of where they are. Review your medicines with your doctor. Some medicines can make you feel dizzy. This can increase your chance of falling. Ask your doctor what other things that you can do to help prevent falls. This information is not intended to replace advice given to you by your health care provider. Make sure you discuss any questions you  have with your health care provider. Document Released: 01/09/2009 Document Revised: 08/21/2015 Document Reviewed: 04/19/2014 Elsevier Interactive Patient Education  2017 [employer].

## 2024-05-04 ENCOUNTER — Encounter: Admitting: Family Medicine

## 2024-05-08 ENCOUNTER — Encounter: Admitting: Family Medicine

## 2024-05-09 ENCOUNTER — Encounter: Admitting: Family Medicine

## 2024-05-24 ENCOUNTER — Ambulatory Visit: Admitting: Physician Assistant

## 2024-05-31 ENCOUNTER — Ambulatory Visit

## 2024-06-14 ENCOUNTER — Ambulatory Visit

## 2024-06-19 ENCOUNTER — Encounter

## 2024-06-20 ENCOUNTER — Encounter: Admitting: Family Medicine
